# Patient Record
Sex: Female | Born: 1970 | Race: Black or African American | Hispanic: No | Marital: Single | Smoking: Never smoker
Health system: Southern US, Community
[De-identification: ages and names within clinical notes are randomized; demographics above are authoritative.]

## PROBLEM LIST (undated history)

## (undated) DIAGNOSIS — T148XXA Other injury of unspecified body region, initial encounter: Secondary | ICD-10-CM

## (undated) DIAGNOSIS — Z9221 Personal history of antineoplastic chemotherapy: Secondary | ICD-10-CM

## (undated) DIAGNOSIS — R059 Cough, unspecified: Secondary | ICD-10-CM

## (undated) DIAGNOSIS — Z1371 Encounter for nonprocreative screening for genetic disease carrier status: Secondary | ICD-10-CM

## (undated) DIAGNOSIS — Z803 Family history of malignant neoplasm of breast: Secondary | ICD-10-CM

## (undated) DIAGNOSIS — I890 Lymphedema, not elsewhere classified: Secondary | ICD-10-CM

## (undated) DIAGNOSIS — Z3009 Encounter for other general counseling and advice on contraception: Secondary | ICD-10-CM

## (undated) DIAGNOSIS — N649 Disorder of breast, unspecified: Secondary | ICD-10-CM

## (undated) DIAGNOSIS — Z950 Presence of cardiac pacemaker: Secondary | ICD-10-CM

## (undated) DIAGNOSIS — R195 Other fecal abnormalities: Secondary | ICD-10-CM

## (undated) DIAGNOSIS — I10 Essential (primary) hypertension: Secondary | ICD-10-CM

## (undated) DIAGNOSIS — Z853 Personal history of malignant neoplasm of breast: Secondary | ICD-10-CM

## (undated) DIAGNOSIS — Z923 Personal history of irradiation: Secondary | ICD-10-CM

## (undated) DIAGNOSIS — G4730 Sleep apnea, unspecified: Secondary | ICD-10-CM

## (undated) DIAGNOSIS — R0600 Dyspnea, unspecified: Secondary | ICD-10-CM

## (undated) DIAGNOSIS — C50912 Malignant neoplasm of unspecified site of left female breast: Secondary | ICD-10-CM

## (undated) HISTORY — DX: Personal history of malignant neoplasm of breast: Z85.3

## (undated) HISTORY — DX: Lymphedema, not elsewhere classified: I89.0

## (undated) HISTORY — PX: TUMOR REMOVAL: SHX12

## (undated) HISTORY — DX: Encounter for nonprocreative screening for genetic disease carrier status: Z13.71

## (undated) HISTORY — PX: HAND SURGERY: SHX662

## (undated) HISTORY — PX: BREAST LUMPECTOMY: SHX2

## (undated) HISTORY — PX: INCISION AND DRAINAGE ABSCESS ANAL: SUR669

## (undated) HISTORY — PX: CARDIAC ELECTROPHYSIOLOGY MAPPING AND ABLATION: SHX1292

## (undated) HISTORY — DX: Encounter for other general counseling and advice on contraception: Z30.09

## (undated) HISTORY — DX: Family history of malignant neoplasm of breast: Z80.3

## (undated) HISTORY — DX: Other injury of unspecified body region, initial encounter: T14.8XXA

## (undated) HISTORY — DX: Disorder of breast, unspecified: N64.9

## (undated) HISTORY — PX: TREATMENT FISTULA ANAL: SUR1390

## (undated) HISTORY — PX: OTHER SURGICAL HISTORY: SHX169

## (undated) HISTORY — DX: Essential (primary) hypertension: I10

## (undated) HISTORY — DX: Other fecal abnormalities: R19.5

## (undated) HISTORY — DX: Malignant neoplasm of unspecified site of left female breast: C50.912

## (undated) HISTORY — PX: BREAST SURGERY: SHX581

## (undated) MED FILL — Fosaprepitant Dimeglumine For IV Infusion 150 MG (Base Eq): INTRAVENOUS | Qty: 5 | Status: AC

---

## 2002-10-25 HISTORY — PX: OTHER SURGICAL HISTORY: SHX169

## 2010-10-25 HISTORY — PX: BREAST LUMPECTOMY: SHX2

## 2010-10-25 HISTORY — PX: BREAST BIOPSY: SHX20

## 2011-06-16 ENCOUNTER — Ambulatory Visit (INDEPENDENT_AMBULATORY_CARE_PROVIDER_SITE_OTHER): Payer: Self-pay | Admitting: Surgery

## 2011-06-30 ENCOUNTER — Ambulatory Visit
Admission: RE | Admit: 2011-06-30 | Discharge: 2011-06-30 | Disposition: A | Discharge status: Home / Self Care | Payer: 59 | Source: Ambulatory Visit | Attending: Surgery | Admitting: Surgery

## 2011-06-30 ENCOUNTER — Other Ambulatory Visit: Payer: Self-pay | Admitting: Diagnostic Radiology

## 2011-06-30 ENCOUNTER — Other Ambulatory Visit (INDEPENDENT_AMBULATORY_CARE_PROVIDER_SITE_OTHER): Payer: Self-pay | Admitting: Surgery

## 2011-06-30 ENCOUNTER — Ambulatory Visit (INDEPENDENT_AMBULATORY_CARE_PROVIDER_SITE_OTHER): Payer: 59 | Admitting: Surgery

## 2011-06-30 ENCOUNTER — Encounter (INDEPENDENT_AMBULATORY_CARE_PROVIDER_SITE_OTHER): Payer: Self-pay | Admitting: Surgery

## 2011-06-30 VITALS — BP 146/88 | HR 60 | Temp 97.8°F | Ht 65.0 in | Wt 200.2 lb

## 2011-06-30 DIAGNOSIS — N6320 Unspecified lump in the left breast, unspecified quadrant: Secondary | ICD-10-CM

## 2011-06-30 DIAGNOSIS — C50912 Malignant neoplasm of unspecified site of left female breast: Secondary | ICD-10-CM | POA: Insufficient documentation

## 2011-06-30 DIAGNOSIS — C50919 Malignant neoplasm of unspecified site of unspecified female breast: Secondary | ICD-10-CM | POA: Insufficient documentation

## 2011-06-30 DIAGNOSIS — N63 Unspecified lump in unspecified breast: Secondary | ICD-10-CM

## 2011-06-30 HISTORY — DX: Malignant neoplasm of unspecified site of left female breast: C50.912

## 2011-06-30 NOTE — Progress Notes (Signed)
NAME: Jessica Simpson                                                                                      DOB: [birth year] DATE: 06/30/2011               MRN: [id number]   CC:  Chief Complaint  Patient presents with  . Breast Mass    HPI: Jessica Simpson is a 40 y.o.  female who presents with A. recently found mass in the left breast and one in the arm pit area. She's never had any problems with breast masses. She has a grandmother who had postmenopausal breast cancer. These masses aren't symptomatic  She came here to be evaluated for possible biopsy.Marland Kitchen  PMH:  has no past medical history on file.  PSH:  has no past surgical history on file.  ALLERGIES:  Allergies  Allergen Reactions  . Codeine     Nausea, vomiting, pain     MEDICATIONS:  No current outpatient prescriptions on file.    ROS; EXAM: GENERAL: The patient is alert, oriented, and generally healthy-appearing, NAD. Mood and affect are normal.  HEENT: The head is normocephalic, the eyes nonicteric, the pupils were round regular and equal. EOMs are normal. Pharynx normal. Dentition good.  NECK: The neck is supple and there are no masses or thyromegaly.  LUNGS: Normal respirations and clear to auscultation.  HEART: Regular rhythm, with no murmurs rubs or gallops. Pulses are intact carotid dorsalis pedis and posterior tibial. No significant varicosities are noted.  BREASTS: The right breast is completely normal in size and shape. In the left breast there is a large perhaps 4 cm hard mass very superficial in the upper outer quadrant with the center of the mass being about 3 fingerbreadths from the areolar margin. There are some dilated veins overlying this. I think the skin moves over it. It is not deep fixed. The remainder of the breast is normal.  LYMPHATICS: There is no right axillary or bilateral supraclavicular adenopathy. However there is a large, approximately 4 cm, lymph node in the left axilla this very hard.  It does not feel fixed or matted.  ABDOMEN: Soft, flat, and nontender. No masses or organomegaly is noted. No hernias are noted. Bowel sounds are normal.  EXTREMITIES: Good range of motion, no edema.   DATA REVIEWED: We have only the ultrasound report that shows a large mass in the breast and one in the left axilla, c/w the PE  IMPRESSION: Likely Stage II or II left breast cance PLAN:  I spoke with her about what I think this is, a cancer. I've told her that she needs to have a biopsy done. He came here actually expecting to have that done.  I think with lesions in both the breast and the axilla, he would be best to do these under ultrasound guidance and I have spoken with Dr. Deboraha Sprang and the breast center, and even though it is late today, they will see her and probably do both ultrasound guided breast biopsy and an ultrasound-guided biopsy of the left axillary mass.  The patient understands this and we have told her  that since we have the pathology report back we will make further plans.

## 2011-06-30 NOTE — Patient Instructions (Signed)
You need to go to the breast center on the fourth floor of our building when you leave the office. They will be able to do a biopsy of the lump in the left breast and probably one on the lump in the left armpit.  We will call you with the result of the biopsy and make further plans after that.

## 2011-07-01 ENCOUNTER — Other Ambulatory Visit (INDEPENDENT_AMBULATORY_CARE_PROVIDER_SITE_OTHER): Payer: Self-pay | Admitting: Surgery

## 2011-07-01 DIAGNOSIS — C50912 Malignant neoplasm of unspecified site of left female breast: Secondary | ICD-10-CM

## 2011-07-02 ENCOUNTER — Telehealth (INDEPENDENT_AMBULATORY_CARE_PROVIDER_SITE_OTHER): Payer: Self-pay | Admitting: General Surgery

## 2011-07-02 NOTE — Telephone Encounter (Signed)
I left message on machine for patient to call me back.

## 2011-07-02 NOTE — Telephone Encounter (Signed)
Message copied by Liliana Cline on Fri Jul 02, 2011  9:40 AM ------      Message from: Currie Paris      Created: Thu Jul 01, 2011  3:53 PM       I believe she has another appointment. Might see if we can get med onc and rad onc as well. She might want to see someone in [place] or [place]

## 2011-07-06 ENCOUNTER — Encounter (INDEPENDENT_AMBULATORY_CARE_PROVIDER_SITE_OTHER): Payer: Self-pay | Admitting: Surgery

## 2011-07-06 ENCOUNTER — Ambulatory Visit (INDEPENDENT_AMBULATORY_CARE_PROVIDER_SITE_OTHER): Payer: 59 | Admitting: Surgery

## 2011-07-06 ENCOUNTER — Ambulatory Visit
Admission: RE | Admit: 2011-07-06 | Discharge: 2011-07-06 | Disposition: A | Discharge status: Home / Self Care | Payer: 59 | Source: Ambulatory Visit | Attending: Surgery | Admitting: Surgery

## 2011-07-06 VITALS — BP 152/98 | HR 78

## 2011-07-06 DIAGNOSIS — C50919 Malignant neoplasm of unspecified site of unspecified female breast: Secondary | ICD-10-CM

## 2011-07-06 DIAGNOSIS — C50912 Malignant neoplasm of unspecified site of left female breast: Secondary | ICD-10-CM

## 2011-07-06 MED ORDER — GADOBENATE DIMEGLUMINE 529 MG/ML IV SOLN
19.0000 mL | Freq: Once | INTRAVENOUS | Status: AC | PRN
  Administered 2011-07-06: 19 mL via INTRAVENOUS

## 2011-07-06 NOTE — Progress Notes (Signed)
The patient comes back after her biopsy of her left breast mass and her left axillary mass. 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 It was HER-2/neu negative. Hormone receptors are pending. She says she tolerated the biopsy well although had some discomfort for the first 24 hours.  She does have him on a schedule for this afternoon. She does not yet have a oncology appointment.  Exam: The breast appears normal without evidence of hematoma following biopsy.  Data reviewed: I reviewed over the pathology reports. Impression: Clinical stage II and possibly a stage III by size.  Plan: I had a long talk with the patient and her husband. I explained her diagnosis and treatment options including mastectomy, lumpectomy, node dissections, chemotherapy, and radiation therapy. I also talked to her about genetic counseling since she has been diagnosed at a fairly young age and has a grandmother who had breast cancer.  I have told her that we will call her when the MRI is complete. We will try to schedule her for a medical oncology appointment in [place], which is closer to her home. We will also make an appointment for genetic counseling.  Also explained to her about Port-A-Cath in case one of those as needed.  The majority of her visit today was in counseling. This took approximately 30 minutes.

## 2011-07-06 NOTE — Patient Instructions (Signed)
We will call you as soon as we have a report on the MRI. We will contact an oncologist to get an appointment and also make an appointment for genetic counseling to see if you should be tested for an abnormal gene that can lead to breast cancer

## 2011-07-07 ENCOUNTER — Other Ambulatory Visit (INDEPENDENT_AMBULATORY_CARE_PROVIDER_SITE_OTHER): Payer: Self-pay | Admitting: Surgery

## 2011-07-07 DIAGNOSIS — R928 Other abnormal and inconclusive findings on diagnostic imaging of breast: Secondary | ICD-10-CM

## 2011-07-08 ENCOUNTER — Telehealth (INDEPENDENT_AMBULATORY_CARE_PROVIDER_SITE_OTHER): Payer: Self-pay | Admitting: Surgery

## 2011-07-08 NOTE — Telephone Encounter (Signed)
I called and left a message to be sure that she was aware of the MRI results she apparently scheduled for ultrasound biopsy of the area of the contralateral breast tomorrow. I also wanted to make sure that she had an appointment with the oncologist arranged.

## 2011-07-09 ENCOUNTER — Other Ambulatory Visit: Payer: 59

## 2011-07-12 ENCOUNTER — Telehealth (INDEPENDENT_AMBULATORY_CARE_PROVIDER_SITE_OTHER): Payer: Self-pay | Admitting: General Surgery

## 2011-07-12 NOTE — Telephone Encounter (Signed)
Patient called for MR results, after getting Dr Tenna Child message. I made patient aware the MRI showed the area we new about and a small area that looks benign, but they do want to do the biopsy she is set up for just to make sure. Patient is ok with this plan and will proceed with her biopsy on 07/15/11.

## 2011-07-13 ENCOUNTER — Encounter (HOSPITAL_COMMUNITY): Payer: 59 | Admitting: Oncology

## 2011-07-13 NOTE — Progress Notes (Signed)
Thayer Ohm, We tried to see her this pm but she cancelled and wanted to come in 7 days. Minerva Areola

## 2011-07-15 ENCOUNTER — Other Ambulatory Visit (INDEPENDENT_AMBULATORY_CARE_PROVIDER_SITE_OTHER): Payer: Self-pay | Admitting: Surgery

## 2011-07-15 ENCOUNTER — Ambulatory Visit
Admission: RE | Admit: 2011-07-15 | Discharge: 2011-07-15 | Disposition: A | Discharge status: Home / Self Care | Payer: 59 | Source: Ambulatory Visit | Attending: Surgery | Admitting: Surgery

## 2011-07-15 DIAGNOSIS — N6310 Unspecified lump in the right breast, unspecified quadrant: Secondary | ICD-10-CM

## 2011-07-15 DIAGNOSIS — R928 Other abnormal and inconclusive findings on diagnostic imaging of breast: Secondary | ICD-10-CM

## 2011-07-20 ENCOUNTER — Encounter (HOSPITAL_COMMUNITY): Payer: 59 | Attending: Oncology | Admitting: Oncology

## 2011-07-20 ENCOUNTER — Encounter (HOSPITAL_COMMUNITY): Payer: Self-pay | Admitting: Oncology

## 2011-07-20 VITALS — BP 135/78 | HR 73 | Temp 98.5°F | Ht 65.5 in | Wt 198.2 lb

## 2011-07-20 DIAGNOSIS — C50912 Malignant neoplasm of unspecified site of left female breast: Secondary | ICD-10-CM

## 2011-07-20 DIAGNOSIS — C50919 Malignant neoplasm of unspecified site of unspecified female breast: Secondary | ICD-10-CM | POA: Insufficient documentation

## 2011-07-20 DIAGNOSIS — C773 Secondary and unspecified malignant neoplasm of axilla and upper limb lymph nodes: Secondary | ICD-10-CM

## 2011-07-20 LAB — COMPREHENSIVE METABOLIC PANEL
ALT: 11 U/L (ref 0–35)
Albumin: 4.1 g/dL (ref 3.5–5.2)
Alkaline Phosphatase: 103 U/L (ref 39–117)
BUN: 10 mg/dL (ref 6–23)
Chloride: 101 mEq/L (ref 96–112)
Glucose, Bld: 94 mg/dL (ref 70–99)
Potassium: 3.6 mEq/L (ref 3.5–5.1)
Sodium: 136 mEq/L (ref 135–145)
Total Bilirubin: 0.2 mg/dL — ABNORMAL LOW (ref 0.3–1.2)
Total Protein: 7.8 g/dL (ref 6.0–8.3)

## 2011-07-20 LAB — DIFFERENTIAL
Eosinophils Absolute: 0.2 10*3/uL (ref 0.0–0.7)
Lymphs Abs: 3.8 10*3/uL (ref 0.7–4.0)
Monocytes Relative: 8 % (ref 3–12)
Neutro Abs: 4 10*3/uL (ref 1.7–7.7)
Neutrophils Relative %: 46 % (ref 43–77)

## 2011-07-20 LAB — CBC
Hemoglobin: 13.1 g/dL (ref 12.0–15.0)
Platelets: 337 10*3/uL (ref 150–400)
RBC: 5 MIL/uL (ref 3.87–5.11)
WBC: 8.8 10*3/uL (ref 4.0–10.5)

## 2011-07-20 NOTE — Progress Notes (Signed)
Addended by: Sterling Big on: 07/20/2011 06:47 PM   Modules accepted: Orders

## 2011-07-20 NOTE — Patient Instructions (Addendum)
[facility]  Discharge Instructions  RECOMMENDATIONS MADE BY THE CONSULTANT AND ANY TEST RESULTS WILL BE SENT TO YOUR REFERRING DOCTOR.   EXAM FINDINGS BY MD TODAY AND SIGNS AND SYMPTOMS TO REPORT TO CLINIC OR PRIMARY ZO:[id number] cancer has been present for a while and we think you have stage III.  Need to do some scans to make sure there is no other areas of involvement and echocardiogram to make sure your heart function is ok.  Also, recommends genetic counseling and we will make that referral.  You have an aggressive cancer and we need to get started with therapy.  Plan to give you FEC (Epirubicin, cytoxan and 5 fluorouracil) for 4 cycles then taxotere for 4 cycles.  With the [facility] would need to support you with Neulasta 24 hours after chemotherapy. Want to treat you every 14 days.  At any time if you want a second opinion we will be happy to assist with that.  MEDICATIONS PRESCRIBED: none   INSTRUCTIONS GIVEN AND DISCUSSED: Other : We will get your scans scheduled and call you with the dates and times.  Once you get a port a cath inserted we can get your chemotherapy started.  SPECIAL INSTRUCTIONS/FOLLOW-UP: To be arranged.   I acknowledge that I have been informed and understand all the instructions given to me and received a copy. I do not have any more questions at this time, but understand that I may call the Specialty Clinic at [facility] at [phone] during business hours should I have any further questions or need assistance in obtaining follow-up care.    __________________________________________  _____________  __________ Signature of Patient or Authorized Representative            Date                   Time    __________________________________________ Nurse's Signature

## 2011-07-20 NOTE — Progress Notes (Signed)
This office note has been dictated.

## 2011-07-21 ENCOUNTER — Telehealth (HOSPITAL_COMMUNITY): Payer: Self-pay

## 2011-07-21 ENCOUNTER — Other Ambulatory Visit (HOSPITAL_COMMUNITY): Payer: Self-pay | Admitting: Oncology

## 2011-07-21 LAB — CANCER ANTIGEN 27.29: CA 27.29: 108 U/mL — ABNORMAL HIGH (ref 0–39)

## 2011-07-21 NOTE — Telephone Encounter (Signed)
Patient notified:  Bone Scan scheduled for tomorrow @ 11 am, 2 D Echocardiogram @ 1 pm here at [facility] and PET Scan scheduled for 10/3 @ 8:30am @ [facility]  Instructions given regarding prep for PET Scan.

## 2011-07-21 NOTE — Telephone Encounter (Signed)
To come to clinic for chemo teaching after injection for Bone Scan tomorrow with Rosana Berger, RN.

## 2011-07-21 NOTE — Progress Notes (Signed)
CC:   Currie Paris, M.D. Grayland Jack, M.D.  DIAGNOSES: 1. Advanced, stage III, left-sided breast cancer, triple negative that     is at least 7 cm in size in the left breast, both clinically and     radiographically and she has multiple positive nodes in the left     axilla, the largest of which is 5-cm radiographically, 6-7 cm     clinically.  She does not have any overtly obvious supraclavicular,     infraclavicular, cervical or inguinal nodes. 2. Mild obesity. 3. History of 1 pregnancy, labor and delivery 10 years ago.  Her     daughters is 58 years old at this time, alive and in good health.  HISTORY:  A very pleasant, 40 year old, African American woman from the [place] area who thought she noticed a lump in the breast last spring of 2011, brought it to the attention of her gynecologist she states in the summer of 2011 as well as an internist in the same month.  She was told that it was probably nothing to worry about.  She was recommended as having a mammogram at age 7.  There was a family history in her maternal grandmother of breast cancer at age 62.  Her grandmother is still alive 11 years out she states.  There is no other family history she states.  She did not breast-feed her child when she was born 10 years ago.  She does not use birth control pills.  This spring, she noticed that it was getting larger but went for the mammogram and was told that it was negative.  When she re-presented to her gynecologist this summer she was told that she needed a digital mammogram she states, went on to have that and biopsy.  I do not have all the original biopsy results in front of me but it is reported through [facility] that this is a triple negative breast cancer.  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  She is here today by herself  to discuss her findings.  She has not been fully staged at this time.  ALLERGIES:  She has no allergies that I am aware of but she reacts to codeine by having nausea and vomiting.  MEDICATIONS:  She is on no medications.  REVIEW OF SYSTEMS:  The rest of the review of systems is negative.  She has never been married.  She was born in the [place], [place] area. She moved down to the [place] area long ago for a potential wedding that never took place 13 years ago.  SOCIAL HISTORY:  She works for a company in [place], [employer].  PHYSICAL EXAMINATION:  Vital signs:  Today show that she is 5 feet 5-1/2 inches tall, weighs 198 pounds, body surface area is 2.04 m2, BMI is 32. Blood pressure 135/78, right arm sitting position, pulse 72 and regular, respirations 16 to 18 and unlabored.  She is afebrile.  General:  She is alert.  She is oriented.  She has normal facial symmetry.  Pupils equally round and reactive to light.  EOMs appear intact.  Throat is clear.  Tongue is normal in the midline.  She follows all commands.  She has good strength in both arms.  She will has a normal gait.  She has no adenopathy in the cervical, supraclavicular, infraclavicular  areas bilaterally, but she has large massive nodes in the left axilla and it measures at least about 6-7 cm across.  In the left breast upper outer quadrant is this large mass which is at least 7-9 cm clinically.  There is no obvious distortion of skin.  The right breast is negative for masses.  Heart:  Shows a regular rhythm and rate without murmur, rub or gallop.  Lungs:  Clear to auscultation and percussion.  Abdomen:  Soft, nontender, without organomegaly or masses.  She has no skin abnormalities.  She has no peripheral edema of the arms or legs.  She is right-handed.  I think Yarixa is just overwhelmed by this diagnosis of advanced local regional breast cancer.  She knows that we are worried about distant disease.  Dr.  Basilio Cairo mentioned that she needs staging and Dr. Basilio Cairo and I have discussed her case today.  We will stage with a PET scan and bone scan as soon as we can set them up.  She will need a Port-A-Cath and what I have proposed to her with this triple negative breast cancer is 1 FEC dose dense every 2 weeks for 4 cycles followed by Taxotere for 2 weeks for 4 cycles.  She needs to do testing to see if she is BRCA1 or BRCA2 positive.  She needs a 2-D echo to evaluate heart function as a baseline and then she needs a Port-A-Cath of course placed prior to therapy.  She knows that if she has stage IV disease the vast evidence indicates we will not be able to cure this.  She will try to get through this she states as best she can.  She knows that this is obviously a serious, serious life-threatening issue.  We will try to set things up as soon as we can.  If this is stage III disease she wanted to know what her chances are of being cured of this disease and I told her that we probably have a 45- 50% chance of increasing her chances of being disease free.  I do not think I can quote her a better survival necessarily.  I have offered her a second opinion anywhere she would like to go and she understands that we are willing to do this.  She will think about this but states that she just would like to get started as soon as possible.  We will see her back in followup.  She does state that she is going on a 3-day trip next Thursday, Friday and Saturday and would like to do that if possible before starting therapy.  I think we can work that out I told her.  I would however, like to get her workup, 2-D echo, all scheduled at least before going away.  Perhaps Dr. Jamey Ripa can put her on the schedule for the 8th for a Port-A-Cath and we can treat her on the 9th.  I would like to see her the day she is treated first.  I think we answered all the questions she had.  We spent an hour and a half together  and spent most of that time in counseling and discussing therapeutic options, staging, etc.  She wanted to know what would take place if she did not take any therapy and I described to her the fact that if she had surgery only that she would probably have a 100% chance of this disease coming back and taking her life.  I suspect that even with chemotherapy for  this triple- negative disease that my sense of improvement with chemotherapy maybe a little on the optimistic side but I total her we will certainly try to do the best we can with therapy and she states that she is willing to proceed.    ______________________________ Ladona Horns. Mariel Sleet, MD ESN/MEDQ  D:  07/20/2011  T:  07/21/2011  Job:  [id number]

## 2011-07-22 ENCOUNTER — Encounter (HOSPITAL_COMMUNITY)
Admission: RE | Admit: 2011-07-22 | Discharge: 2011-07-22 | Disposition: A | Discharge status: Home / Self Care | Payer: 59 | Source: Ambulatory Visit | Attending: Oncology | Admitting: Oncology

## 2011-07-22 ENCOUNTER — Ambulatory Visit (HOSPITAL_COMMUNITY)
Admission: RE | Admit: 2011-07-22 | Discharge: 2011-07-22 | Disposition: A | Discharge status: Home / Self Care | Payer: 59 | Source: Ambulatory Visit | Attending: Oncology | Admitting: Oncology

## 2011-07-22 ENCOUNTER — Encounter (HOSPITAL_COMMUNITY): Payer: Self-pay

## 2011-07-22 ENCOUNTER — Telehealth (HOSPITAL_COMMUNITY): Payer: Self-pay | Admitting: *Deleted

## 2011-07-22 ENCOUNTER — Encounter (HOSPITAL_COMMUNITY): Payer: 59

## 2011-07-22 ENCOUNTER — Telehealth (INDEPENDENT_AMBULATORY_CARE_PROVIDER_SITE_OTHER): Payer: Self-pay | Admitting: Surgery

## 2011-07-22 DIAGNOSIS — Z9221 Personal history of antineoplastic chemotherapy: Secondary | ICD-10-CM | POA: Insufficient documentation

## 2011-07-22 DIAGNOSIS — C50912 Malignant neoplasm of unspecified site of left female breast: Secondary | ICD-10-CM

## 2011-07-22 DIAGNOSIS — Z853 Personal history of malignant neoplasm of breast: Secondary | ICD-10-CM | POA: Insufficient documentation

## 2011-07-22 DIAGNOSIS — M25569 Pain in unspecified knee: Secondary | ICD-10-CM | POA: Insufficient documentation

## 2011-07-22 DIAGNOSIS — C50919 Malignant neoplasm of unspecified site of unspecified female breast: Secondary | ICD-10-CM | POA: Insufficient documentation

## 2011-07-22 DIAGNOSIS — I517 Cardiomegaly: Secondary | ICD-10-CM

## 2011-07-22 MED ORDER — TECHNETIUM TC 99M MEDRONATE IV KIT: 25.0000 | PACK | Freq: Once | INTRAVENOUS | Status: DC | PRN

## 2011-07-22 NOTE — Telephone Encounter (Signed)
I spoke with her little bit about her diagnosis. She has become concerned because initially she thought she was going to be a stage II and now she may actually be a stage IV. I went over all of that in detail with her. She had a bone scan today which is negative for metastatic disease. She has a PET scan scheduled next week to look for metastatic disease.  She is now planning to have neoadjuvant chemotherapy and I will be putting a Port-A-Cath in her in about 10 days. The chemotherapy we'll start the next day.  I told her to give me a call if she has any other questions or concerns.

## 2011-07-22 NOTE — Progress Notes (Signed)
*  PRELIMINARY RESULTS* Echocardiogram 2D Echocardiogram has been performed.  Conrad Tazewell 07/22/2011, 1:27 PM

## 2011-07-22 NOTE — Telephone Encounter (Signed)
Dr. Mariel Sleet - pt would like a second opinion wherever you recommend. I did chemo teaching and she is not thrilled about taking chemo (to say the least). She is very angry with her situation. She is not angry with [place]. Just the situation.

## 2011-07-22 NOTE — Patient Instructions (Signed)
[facility]   CHEMOTHERAPY INSTRUCTIONS 5FU, Epirubicin, Cytoxan, Taxotere  POTENTIAL SIDE EFFECTS OF TREATMENT: Increased Susceptibility to Infection, Vomiting, Constipation, Red or Pink Urine (with Adriamycin/Epirubicin), Hair Thinning, Hair Loss, Changes in Character of Skin and Nails (brittleness, dryness,etc.), Pigment Changes (darkening of nail beds, palms of hands, soles of feet, etc.), Bone Marrow Suppression, Abdominal Cramping, Urinary Frequency and Blood in Urine   SELF IMAGE NEEDS AND REFERRALS MADE: Obtain hair accessories as soon as possible (wigs, scarves, turbans,caps,etc.) and Referral to Look Good, Feel Better consultant   EDUCATIONAL MATERIALS GIVEN AND REVIEWED: Chemotherapy and You and Radiation Therapy and You   SELF CARE ACTIVITIES WHILE ON CHEMOTHERAPY: Increase your fluid intake 48 hours prior to treatment and drink at least 2 quarts per day after treatment., No alcohol intake., No aspirin or other medications unless approved by your oncologist., Eat foods that are light and easy to digest., No fried, fatty, or spicy foods immediately before or after treatment., Have teeth cleaned professionally before starting treatment. Keep dentures and partial plates clean., Use soft toothbrush and do not use mouthwashes that contain alcohol. Biotene is a good mouthwash that is available at most pharmacies or may be ordered by calling [phone]., Use warm salt water gargles (1 teaspoon salt per 1 quart warm water) before and after meals and at bedtime. Or you may rinse with 2 tablespoons of three -percent hydrogen peroxide mixed in eight ounces of water. and Always use sunscreen with SPF (Sun Protection Factor) of 30 or higher.   MEDICATIONS: You have been given prescriptions for the following medications: Ativan 1mg  every 3 to 4 hours as needed for nausea or vomiting  Compazine suppositories 25mg  1 per rectum every 6 hours as needed for  nausea or vomiting Zofran (take as directed) for nausea/vomiting Dexamethasone (take as directed) for nausea/vomiting   SYMPTOMS TO REPORT AS SOON AS POSSIBLE AFTER TREATMENT:  FEVER GREATER THAN 100.5 F  CHILLS WITH OR WITHOUT FEVER  NAUSEA AND VOMITING THAT IS NOT CONTROLLED WITH YOUR NAUSEA MEDICATION  UNUSUAL SHORTNESS OF BREATH  UNUSUAL BRUISING OR BLEEDING  TENDERNESS IN MOUTH AND THROAT WITH OR WITHOUT PRESENCE OF ULCERS  URINARY PROBLEMS  BOWEL PROBLEMS  UNUSUAL RASH    Wear comfortable clothing and clothing appropriate for easy access to any Portacath or PICC line. Let us know if there is anything that we can do to make your therapy better!      I have been informed and understand all of the instructions given to me and have received a copy. I have been instructed to call the clinic [phone] or my family physician as soon as possible for continued medical care, if indicated. I do not have any more questions at this time but understand that I may call the Cancer Center or the Patient Navigator at [phone] during office hours should I have questions or need assistance in obtaining follow-up care.      _________________________________________      _______________     __________ Signature of Patient or Authorized Representative        Date                            Time      _________________________________________ Nurse's Signature

## 2011-07-23 ENCOUNTER — Telehealth (HOSPITAL_COMMUNITY): Payer: Self-pay

## 2011-07-23 ENCOUNTER — Other Ambulatory Visit (HOSPITAL_COMMUNITY): Payer: Self-pay | Admitting: Oncology

## 2011-07-23 NOTE — Telephone Encounter (Signed)
Notified that bone scan was negative per request of Dr. Mariel Sleet.

## 2011-07-26 MED ORDER — LORAZEPAM 1 MG PO TABS: 1.0000 mg | ORAL_TABLET | ORAL | Status: DC | PRN

## 2011-07-26 MED ORDER — PROCHLORPERAZINE 25 MG RE SUPP: 25.0000 mg | Freq: Four times a day (QID) | RECTAL | Status: DC | PRN

## 2011-07-26 MED ORDER — ONDANSETRON HCL 8 MG PO TABS: ORAL_TABLET | ORAL | Status: DC

## 2011-07-26 MED ORDER — DEXAMETHASONE 4 MG PO TABS: ORAL_TABLET | ORAL | Status: DC

## 2011-07-26 NOTE — Progress Notes (Signed)
Chemo teaching done on 07/22/11. Chemo consent and chemo teaching not signed due to patient wanting to see Dr. Mariel Sleet again before signing consent. Patient was very overwhelmed with the chemo information.

## 2011-07-28 ENCOUNTER — Encounter (HOSPITAL_COMMUNITY)
Admission: RE | Admit: 2011-07-28 | Discharge: 2011-07-28 | Disposition: A | Discharge status: Home / Self Care | Payer: 59 | Source: Ambulatory Visit | Attending: Surgery | Admitting: Surgery

## 2011-07-28 ENCOUNTER — Encounter (HOSPITAL_COMMUNITY)
Admission: RE | Admit: 2011-07-28 | Discharge: 2011-07-28 | Disposition: A | Discharge status: Home / Self Care | Payer: 59 | Source: Ambulatory Visit | Attending: Oncology | Admitting: Oncology

## 2011-07-28 ENCOUNTER — Ambulatory Visit (HOSPITAL_COMMUNITY): Payer: 59 | Admitting: Oncology

## 2011-07-28 DIAGNOSIS — C50919 Malignant neoplasm of unspecified site of unspecified female breast: Secondary | ICD-10-CM | POA: Insufficient documentation

## 2011-07-28 DIAGNOSIS — R599 Enlarged lymph nodes, unspecified: Secondary | ICD-10-CM | POA: Insufficient documentation

## 2011-07-28 DIAGNOSIS — C50912 Malignant neoplasm of unspecified site of left female breast: Secondary | ICD-10-CM

## 2011-07-28 LAB — SURGICAL PCR SCREEN
MRSA, PCR: NEGATIVE
Staphylococcus aureus: NEGATIVE

## 2011-07-28 MED ORDER — FLUDEOXYGLUCOSE F - 18 (FDG) INJECTION
16.2000 | Freq: Once | INTRAVENOUS | Status: AC | PRN
  Administered 2011-07-28: 16.2 via INTRAVENOUS

## 2011-07-30 ENCOUNTER — Telehealth (HOSPITAL_COMMUNITY): Payer: Self-pay

## 2011-07-30 NOTE — Telephone Encounter (Signed)
error 

## 2011-08-02 ENCOUNTER — Ambulatory Visit (HOSPITAL_COMMUNITY): Payer: 59

## 2011-08-02 ENCOUNTER — Encounter (INDEPENDENT_AMBULATORY_CARE_PROVIDER_SITE_OTHER): Payer: Self-pay | Admitting: Surgery

## 2011-08-02 ENCOUNTER — Ambulatory Visit (HOSPITAL_COMMUNITY)
Admission: RE | Admit: 2011-08-02 | Discharge: 2011-08-02 | Disposition: A | Discharge status: Home / Self Care | Payer: 59 | Source: Ambulatory Visit | Attending: Surgery | Admitting: Surgery

## 2011-08-02 ENCOUNTER — Telehealth (HOSPITAL_COMMUNITY): Payer: Self-pay | Admitting: Oncology

## 2011-08-02 DIAGNOSIS — C50919 Malignant neoplasm of unspecified site of unspecified female breast: Secondary | ICD-10-CM

## 2011-08-02 DIAGNOSIS — I10 Essential (primary) hypertension: Secondary | ICD-10-CM | POA: Insufficient documentation

## 2011-08-02 DIAGNOSIS — Z01812 Encounter for preprocedural laboratory examination: Secondary | ICD-10-CM | POA: Insufficient documentation

## 2011-08-02 DIAGNOSIS — E669 Obesity, unspecified: Secondary | ICD-10-CM | POA: Insufficient documentation

## 2011-08-02 HISTORY — PX: PORTACATH PLACEMENT: SHX2246

## 2011-08-03 ENCOUNTER — Encounter (HOSPITAL_BASED_OUTPATIENT_CLINIC_OR_DEPARTMENT_OTHER): Payer: 59

## 2011-08-03 ENCOUNTER — Encounter (HOSPITAL_COMMUNITY): Payer: Self-pay | Admitting: Oncology

## 2011-08-03 ENCOUNTER — Encounter (HOSPITAL_COMMUNITY): Payer: 59 | Attending: Oncology | Admitting: Oncology

## 2011-08-03 ENCOUNTER — Telehealth (HOSPITAL_COMMUNITY): Payer: Self-pay | Admitting: *Deleted

## 2011-08-03 VITALS — Ht 65.0 in | Wt 195.0 lb

## 2011-08-03 VITALS — BP 168/87 | HR 72 | Temp 98.3°F | Ht 65.5 in | Wt 195.0 lb

## 2011-08-03 DIAGNOSIS — E669 Obesity, unspecified: Secondary | ICD-10-CM

## 2011-08-03 DIAGNOSIS — C773 Secondary and unspecified malignant neoplasm of axilla and upper limb lymph nodes: Secondary | ICD-10-CM

## 2011-08-03 DIAGNOSIS — C50919 Malignant neoplasm of unspecified site of unspecified female breast: Secondary | ICD-10-CM

## 2011-08-03 DIAGNOSIS — Z5111 Encounter for antineoplastic chemotherapy: Secondary | ICD-10-CM

## 2011-08-03 DIAGNOSIS — C50912 Malignant neoplasm of unspecified site of left female breast: Secondary | ICD-10-CM

## 2011-08-03 DIAGNOSIS — D702 Other drug-induced agranulocytosis: Secondary | ICD-10-CM | POA: Insufficient documentation

## 2011-08-03 MED ORDER — HEPARIN SOD (PORK) LOCK FLUSH 100 UNIT/ML IV SOLN
INTRAVENOUS | Status: AC
  Administered 2011-08-03: 500 [IU]
  Filled 2011-08-03: qty 5

## 2011-08-03 MED ORDER — DEXAMETHASONE SODIUM PHOSPHATE 4 MG/ML IJ SOLN: 12.0000 mg | Freq: Once | INTRAMUSCULAR | Status: DC

## 2011-08-03 MED ORDER — SODIUM CHLORIDE 0.9 % IV SOLN
Freq: Once | INTRAVENOUS | Status: AC
  Administered 2011-08-03: 10:00:00 via INTRAVENOUS

## 2011-08-03 MED ORDER — SODIUM CHLORIDE 0.9 % IV SOLN
500.0000 mg/m2 | Freq: Once | INTRAVENOUS | Status: AC
  Administered 2011-08-03: 1020 mg via INTRAVENOUS
  Filled 2011-08-03 (×2): qty 51

## 2011-08-03 MED ORDER — EPIRUBICIN HCL CHEMO IV INJECTION 200 MG/100ML
100.0000 mg/m2 | Freq: Once | INTRAVENOUS | Status: AC
  Administered 2011-08-03: 204 mg via INTRAVENOUS
  Filled 2011-08-03 (×4): qty 102

## 2011-08-03 MED ORDER — SODIUM CHLORIDE 0.9 % IV SOLN
150.0000 mg | Freq: Once | INTRAVENOUS | Status: AC
  Administered 2011-08-03: 150 mg via INTRAVENOUS
  Filled 2011-08-03: qty 5

## 2011-08-03 MED ORDER — FLUOROURACIL CHEMO INJECTION 2.5 GM/50ML
500.0000 mg/m2 | Freq: Once | INTRAVENOUS | Status: AC
  Administered 2011-08-03: 1000 mg via INTRAVENOUS
  Filled 2011-08-03 (×2): qty 20

## 2011-08-03 MED ORDER — HEPARIN SOD (PORK) LOCK FLUSH 100 UNIT/ML IV SOLN
500.0000 [IU] | Freq: Once | INTRAVENOUS | Status: AC | PRN
  Administered 2011-08-03: 500 [IU]
  Filled 2011-08-03: qty 5

## 2011-08-03 MED ORDER — PALONOSETRON HCL INJECTION 0.25 MG/5ML
0.2500 mg | Freq: Once | INTRAVENOUS | Status: AC
  Administered 2011-08-03: 0.25 mg via INTRAVENOUS
  Filled 2011-08-03: qty 5

## 2011-08-03 NOTE — Progress Notes (Signed)
This office note has been dictated.

## 2011-08-03 NOTE — Progress Notes (Signed)
Tolerated chemo well. Home accompanied by spouse. 

## 2011-08-03 NOTE — Patient Instructions (Addendum)
[facility]  Discharge Instructions  RECOMMENDATIONS MADE BY THE CONSULTANT AND ANY TEST RESULTS WILL BE SENT TO YOUR REFERRING DOCTOR.   EXAM FINDINGS BY MD TODAY AND SIGNS AND SYMPTOMS TO REPORT TO CLINIC OR PRIMARY MD: Your bone scan was negative and your PET scan only showed areas of involvement that we already know about.  No evidence of disease anywhere else in your body.   Triple negative breast cancer is more common in young African American women.  We are using dose dense therapy in an attempt to reduce your tumor burden before you have surgery.  You will get Neulasta injection 24 hours after therapy to keep your white blood cell count up and hopefully reduce your risk of getting an infection.  Most important part of prevention of infection is good hand washing for you and everyone that is going to be around you.  MEDICATIONS PRESCRIBED: none    SPECIAL INSTRUCTIONS/FOLLOW-UP: Return to Clinic in 2 weeks on 10/23 at 10:30 to see our PA [facility] and in 4 weeks on 11/12 at 11:30  to see Dr. Mariel Sleet.   I acknowledge that I have been informed and understand all the instructions given to me and received a copy. I do not have any more questions at this time, but understand that I may call the Specialty Clinic at [facility] at [phone] during business hours should I have any further questions or need assistance in obtaining follow-up care.    __________________________________________  _____________  __________ Signature of Patient or Authorized Representative            Date                   Time    __________________________________________ Nurse's Signature

## 2011-08-03 NOTE — Telephone Encounter (Signed)
Dr. Mariel Sleet - patient wants to know if she can go ahead and get a rx or have me call in a rx for an appetite stimulant b/c she already isn't eating very well and is wanting to be prepared for chemo taking away her appetite. Please advise.

## 2011-08-04 ENCOUNTER — Encounter (HOSPITAL_BASED_OUTPATIENT_CLINIC_OR_DEPARTMENT_OTHER): Payer: 59

## 2011-08-04 ENCOUNTER — Telehealth (HOSPITAL_COMMUNITY): Payer: Self-pay | Admitting: Oncology

## 2011-08-04 DIAGNOSIS — D701 Agranulocytosis secondary to cancer chemotherapy: Secondary | ICD-10-CM

## 2011-08-04 DIAGNOSIS — C773 Secondary and unspecified malignant neoplasm of axilla and upper limb lymph nodes: Secondary | ICD-10-CM

## 2011-08-04 DIAGNOSIS — C50912 Malignant neoplasm of unspecified site of left female breast: Secondary | ICD-10-CM

## 2011-08-04 DIAGNOSIS — C50919 Malignant neoplasm of unspecified site of unspecified female breast: Secondary | ICD-10-CM

## 2011-08-04 MED ORDER — PEGFILGRASTIM INJECTION 6 MG/0.6ML
SUBCUTANEOUS | Status: AC
  Administered 2011-08-04: 6 mg via SUBCUTANEOUS
  Filled 2011-08-04: qty 0.6

## 2011-08-04 MED ORDER — PEGFILGRASTIM INJECTION 6 MG/0.6ML
6.0000 mg | Freq: Once | SUBCUTANEOUS | Status: AC
  Administered 2011-08-04: 6 mg via SUBCUTANEOUS

## 2011-08-04 NOTE — Progress Notes (Signed)
Tolerated injection well. 

## 2011-08-04 NOTE — Op Note (Signed)
  Jessica Simpson, MEHARG NO.:  [id number]  MEDICAL RECORD NO.:  [id number]  LOCATION:  [facility]SC                         FACILITY:  [facility]  PHYSICIAN:  Currie Paris, M.D.DATE OF BIRTH:  1971/09/28  DATE OF PROCEDURE:  08/02/2011 DATE OF DISCHARGE:                              OPERATIVE REPORT   PREOPERATIVE DIAGNOSIS:  Stage III left breast cancer.  POSTOPERATIVE DIAGNOSIS:  Stage III left breast cancer.  PROCEDURE:  Port-A-Cath placement.  SURGEON:  Currie Paris, MD  ASSISTANT:  Larrie Kass, PA student.  ANESTHESIA:  General (LMA).  CLINICAL HISTORY:  This is a 40 year old lady who has stage III breast cancer getting ready to start neoadjuvant chemotherapy and needed IV access for the chemo.  DESCRIPTION OF PROCEDURE:  I saw the patient in the preoperative area and reviewed the plans for the procedure and she had no further questions.  She was taken to the operating room.  After satisfactory general anesthesia had been obtained, the upper chest and lower neck were prepped and draped as a sterile field and the time-out was done.  I entered the right subclavian vein on the initial attempt and using fluoro threaded the guidewire into the superior vena cava right atrial area.  A transverse incision was made on the anterior chest wall in a pocket fashion for the Port-A-Cath reservoir.  The Port-A-Cath tubing was then pulled through the subcutaneous tunnel from the reservoir site to the guidewire site.  Using fluoro, I advanced the peel-away sheath and dilator over the guidewire following this with fluoro.  I went ahead in the superior vena cava.  I removed the guidewire and dilator.  The catheter was threaded into about 22 cm and the peel-away sheath removed.  This aspirated and flushed easily.  I backed it up to approximately 17 cm where it appeared to be in the distal SVC.  It aspirated and flushed easily.  The reservoir was flushed,  attached, and a locking mechanism engaged. This was placed in the pocket.  A fluoro was done and this appeared to be in good position.  I then closed the incision with 3-0 Vicryl and 4-0 Monocryl subcuticular, had some Dermabond.  The port was accessed and aspirated, flushed with dilute and then concentrated aqueous heparin and left accessed for her chemo tomorrow.  The patient tolerated the procedure well.  There were no complications. All counts were correct.     Currie Paris, M.D.     CJS/MEDQ  D:  08/02/2011  T:  08/02/2011  Job:  [id number]  cc:   Ladona Horns. Mariel Sleet, MD  Electronically Signed by Cyndia Bent M.D. on 08/04/2011 07:15:12 PM

## 2011-08-04 NOTE — Progress Notes (Signed)
CC:   Currie Paris, M.D. Grayland Jack, M.D.  DIAGNOSES: 1. She had a triple negative breast cancer that is Ki-67 marker 99%,     and she has at least a T3 N2 cancer giving her stage IIIA disease     at this time.  Her PET scan showed no evidence of distant     metastases.  Her cancer marker however, CA 27.29, was high at 108.     Her liver enzymes however are normal.  CBC and differential are     unremarkable. 2. Obesity. 3. Codeine intolerance.  HISTORY:  This is a very pleasant 40 year old [place] American woman with triple negative breast cancer who has now been staged.  She had a bone scan on the 27th of September which showed no evidence for metastatic disease.  We also did a PET scan on 07/28/2011 which showed no evidence of distant disease, but she had an intensely metabolic tumor in the left breast that measured 6.6 x 3.5 cm with an SUV max of 38. She had enlarged hypermetabolic left axillary nodes, one of which was small and just between the pectoralis major and minor muscles measuring 6 mm but with an SUV of 4.2, and then this large mass that is at least 3.2 cm in size with an SUV of 30 which appears to be probably a conglomerate of nodes in my opinion.  She is here today with her significant other to go over the results but also to initiate chemotherapy.  We are planning dose dense FEC followed by dose dense Taxotere for 4 cycles each.  She is also going to have a consultation for a second opinion with Dr. Marc Morgans at [facility] a week from this coming Friday.  I placed a phone call to Alan Ripper but have not heard back from her yet.  I wanted to just touch base with Alan Ripper about her situation, our plans for therapy and see if she had anything to add, but more importantly to discuss her at a time when we can both be on the same page with her therapeutic plan.  She of course saw Dr. Lonie Peak who definitely feels that she is a candidate for radiation  therapy after definitive chemotherapy and surgery.  We also plan genetic testing which has been set up for her as well.  I think we are very fortunate not to have distant metastatic disease seen on a bone scan or PET scan.  I think we have a chance to significantly impact this cancer even though she has a poor prognosis in some ways but we are going to be very aggressive with her therapy and do the best we can.  I think we answered all of their questions as best we could, and she is ready to proceed.    ______________________________ Ladona Horns. Mariel Sleet, MD ESN/MEDQ  D:  08/03/2011  T:  08/04/2011  Job:  [id number]

## 2011-08-17 ENCOUNTER — Encounter (HOSPITAL_BASED_OUTPATIENT_CLINIC_OR_DEPARTMENT_OTHER): Payer: 59

## 2011-08-17 ENCOUNTER — Ambulatory Visit (HOSPITAL_COMMUNITY): Payer: 59 | Admitting: Oncology

## 2011-08-17 VITALS — BP 129/83 | HR 91 | Temp 97.8°F | Ht 65.0 in | Wt 197.0 lb

## 2011-08-17 DIAGNOSIS — Z5111 Encounter for antineoplastic chemotherapy: Secondary | ICD-10-CM

## 2011-08-17 DIAGNOSIS — C773 Secondary and unspecified malignant neoplasm of axilla and upper limb lymph nodes: Secondary | ICD-10-CM

## 2011-08-17 DIAGNOSIS — C50912 Malignant neoplasm of unspecified site of left female breast: Secondary | ICD-10-CM

## 2011-08-17 DIAGNOSIS — C50919 Malignant neoplasm of unspecified site of unspecified female breast: Secondary | ICD-10-CM

## 2011-08-17 LAB — COMPREHENSIVE METABOLIC PANEL
BUN: 11 mg/dL (ref 6–23)
CO2: 27 mEq/L (ref 19–32)
Calcium: 9 mg/dL (ref 8.4–10.5)
GFR calc Af Amer: >90 mL/min (ref >90)
GFR calc non Af Amer: 87 mL/min — ABNORMAL LOW (ref >90)
Glucose, Bld: 108 mg/dL — ABNORMAL HIGH (ref 70–99)
Total Protein: 6.6 g/dL (ref 6.0–8.3)

## 2011-08-17 LAB — DIFFERENTIAL
Eosinophils Absolute: 0 10*3/uL (ref 0.0–0.7)
Eosinophils Relative: 0 % (ref 0–5)
Lymphocytes Relative: 25 % (ref 12–46)
Neutro Abs: 3.4 10*3/uL (ref 1.7–7.7)
Neutrophils Relative %: 62 % (ref 43–77)

## 2011-08-17 LAB — CBC
MCHC: 32.9 g/dL (ref 30.0–36.0)
Platelets: 189 10*3/uL (ref 150–400)
RDW: 14.1 % (ref 11.5–15.5)
WBC: 5.5 10*3/uL (ref 4.0–10.5)

## 2011-08-17 MED ORDER — SODIUM CHLORIDE 0.9 % IV SOLN
Freq: Once | INTRAVENOUS | Status: AC
  Administered 2011-08-17: 500 mL via INTRAVENOUS

## 2011-08-17 MED ORDER — DEXAMETHASONE SODIUM PHOSPHATE 4 MG/ML IJ SOLN: 12.0000 mg | Freq: Once | INTRAMUSCULAR | Status: DC

## 2011-08-17 MED ORDER — SODIUM CHLORIDE 0.9 % IV SOLN: 150.0000 mg | Freq: Once | INTRAVENOUS | Status: DC

## 2011-08-17 MED ORDER — SODIUM CHLORIDE 0.9 % IV SOLN
500.0000 mg/m2 | Freq: Once | INTRAVENOUS | Status: AC
  Administered 2011-08-17: 1020 mg via INTRAVENOUS
  Filled 2011-08-17: qty 51

## 2011-08-17 MED ORDER — PALONOSETRON HCL INJECTION 0.25 MG/5ML
0.2500 mg | Freq: Once | INTRAVENOUS | Status: AC
  Administered 2011-08-17: 0.25 mg via INTRAVENOUS
  Filled 2011-08-17: qty 5

## 2011-08-17 MED ORDER — EPIRUBICIN HCL CHEMO IV INJECTION 200 MG/100ML
100.0000 mg/m2 | Freq: Once | INTRAVENOUS | Status: AC
  Administered 2011-08-17: 204 mg via INTRAVENOUS
  Filled 2011-08-17: qty 102

## 2011-08-17 MED ORDER — HEPARIN SOD (PORK) LOCK FLUSH 100 UNIT/ML IV SOLN
500.0000 [IU] | Freq: Once | INTRAVENOUS | Status: AC | PRN
  Administered 2011-08-17: 500 [IU]
  Filled 2011-08-17: qty 5

## 2011-08-17 MED ORDER — FLUOROURACIL CHEMO INJECTION 2.5 GM/50ML
500.0000 mg/m2 | Freq: Once | INTRAVENOUS | Status: AC
  Administered 2011-08-17: 1000 mg via INTRAVENOUS
  Filled 2011-08-17: qty 20

## 2011-08-17 MED ORDER — SODIUM CHLORIDE 0.9 % IJ SOLN
10.0000 mL | INTRAMUSCULAR | Status: DC | PRN
  Filled 2011-08-17: qty 10

## 2011-08-17 MED ORDER — HEPARIN SOD (PORK) LOCK FLUSH 100 UNIT/ML IV SOLN
INTRAVENOUS | Status: AC
  Administered 2011-08-17: 500 [IU]
  Filled 2011-08-17: qty 5

## 2011-08-17 MED ORDER — SODIUM CHLORIDE 0.9 % IV SOLN
Freq: Once | INTRAVENOUS | Status: AC
  Administered 2011-08-17: 12:00:00 via INTRAVENOUS
  Filled 2011-08-17: qty 5

## 2011-08-17 NOTE — Progress Notes (Signed)
Tolerated chemo well. 

## 2011-08-18 ENCOUNTER — Encounter (HOSPITAL_BASED_OUTPATIENT_CLINIC_OR_DEPARTMENT_OTHER): Payer: 59

## 2011-08-18 VITALS — BP 133/78 | HR 80 | Temp 98.1°F

## 2011-08-18 DIAGNOSIS — C50919 Malignant neoplasm of unspecified site of unspecified female breast: Secondary | ICD-10-CM

## 2011-08-18 DIAGNOSIS — C50912 Malignant neoplasm of unspecified site of left female breast: Secondary | ICD-10-CM

## 2011-08-18 DIAGNOSIS — C773 Secondary and unspecified malignant neoplasm of axilla and upper limb lymph nodes: Secondary | ICD-10-CM

## 2011-08-18 MED ORDER — PEGFILGRASTIM INJECTION 6 MG/0.6ML
SUBCUTANEOUS | Status: AC
  Administered 2011-08-18: 6 mg via SUBCUTANEOUS
  Filled 2011-08-18: qty 0.6

## 2011-08-18 MED ORDER — PEGFILGRASTIM INJECTION 6 MG/0.6ML
6.0000 mg | Freq: Once | SUBCUTANEOUS | Status: AC
  Administered 2011-08-18: 6 mg via SUBCUTANEOUS

## 2011-08-18 NOTE — Progress Notes (Signed)
Jessica Simpson presents today for injection per MD orders. Neulasta 6mg  administered SQ in right Abdomen. Administration without incident. Patient tolerated well.

## 2011-08-19 ENCOUNTER — Other Ambulatory Visit (HOSPITAL_COMMUNITY): Payer: Self-pay | Admitting: Oncology

## 2011-08-19 DIAGNOSIS — C50912 Malignant neoplasm of unspecified site of left female breast: Secondary | ICD-10-CM

## 2011-08-31 ENCOUNTER — Encounter (HOSPITAL_BASED_OUTPATIENT_CLINIC_OR_DEPARTMENT_OTHER): Payer: 59 | Admitting: Oncology

## 2011-08-31 ENCOUNTER — Encounter (HOSPITAL_COMMUNITY): Payer: 59 | Attending: Oncology

## 2011-08-31 VITALS — BP 138/80 | HR 77 | Temp 97.1°F | Ht 65.0 in | Wt 197.0 lb

## 2011-08-31 DIAGNOSIS — C50912 Malignant neoplasm of unspecified site of left female breast: Secondary | ICD-10-CM

## 2011-08-31 DIAGNOSIS — C50919 Malignant neoplasm of unspecified site of unspecified female breast: Secondary | ICD-10-CM

## 2011-08-31 DIAGNOSIS — C773 Secondary and unspecified malignant neoplasm of axilla and upper limb lymph nodes: Secondary | ICD-10-CM

## 2011-08-31 DIAGNOSIS — Z5111 Encounter for antineoplastic chemotherapy: Secondary | ICD-10-CM

## 2011-08-31 LAB — COMPREHENSIVE METABOLIC PANEL
Alkaline Phosphatase: 95 U/L (ref 39–117)
BUN: 7 mg/dL (ref 6–23)
Calcium: 9.2 mg/dL (ref 8.4–10.5)
GFR calc Af Amer: >90 mL/min (ref >90)
Glucose, Bld: 108 mg/dL — ABNORMAL HIGH (ref 70–99)
Total Protein: 7.1 g/dL (ref 6.0–8.3)

## 2011-08-31 LAB — DIFFERENTIAL
Basophils Absolute: 0.1 10*3/uL (ref 0.0–0.1)
Eosinophils Absolute: 0 10*3/uL (ref 0.0–0.7)
Lymphs Abs: 1.1 10*3/uL (ref 0.7–4.0)
Neutro Abs: 3.2 10*3/uL (ref 1.7–7.7)

## 2011-08-31 LAB — CBC
HCT: 34.8 % — ABNORMAL LOW (ref 36.0–46.0)
MCHC: 33 g/dL (ref 30.0–36.0)
MCV: 80.7 fL (ref 78.0–100.0)
RDW: 14.9 % (ref 11.5–15.5)

## 2011-08-31 MED ORDER — SODIUM CHLORIDE 0.9 % IV SOLN
Freq: Once | INTRAVENOUS | Status: AC
  Administered 2011-08-31: 11:00:00 via INTRAVENOUS
  Filled 2011-08-31: qty 5

## 2011-08-31 MED ORDER — SODIUM CHLORIDE 0.9 % IV SOLN
500.0000 mg/m2 | Freq: Once | INTRAVENOUS | Status: AC
  Administered 2011-08-31: 1020 mg via INTRAVENOUS
  Filled 2011-08-31: qty 51

## 2011-08-31 MED ORDER — SODIUM CHLORIDE 0.9 % IJ SOLN
10.0000 mL | INTRAMUSCULAR | Status: DC | PRN
  Administered 2011-08-31: 10 mL
  Filled 2011-08-31: qty 10

## 2011-08-31 MED ORDER — FLUOROURACIL CHEMO INJECTION 2.5 GM/50ML
500.0000 mg/m2 | Freq: Once | INTRAVENOUS | Status: AC
  Administered 2011-08-31: 1000 mg via INTRAVENOUS
  Filled 2011-08-31: qty 20

## 2011-08-31 MED ORDER — DEXAMETHASONE SODIUM PHOSPHATE 4 MG/ML IJ SOLN: 12.0000 mg | Freq: Once | INTRAMUSCULAR | Status: DC

## 2011-08-31 MED ORDER — PALONOSETRON HCL INJECTION 0.25 MG/5ML
0.2500 mg | Freq: Once | INTRAVENOUS | Status: AC
  Administered 2011-08-31: 0.25 mg via INTRAVENOUS
  Filled 2011-08-31: qty 5

## 2011-08-31 MED ORDER — HEPARIN SOD (PORK) LOCK FLUSH 100 UNIT/ML IV SOLN
500.0000 [IU] | Freq: Once | INTRAVENOUS | Status: AC | PRN
  Administered 2011-08-31: 500 [IU]
  Filled 2011-08-31: qty 5

## 2011-08-31 MED ORDER — SODIUM CHLORIDE 0.9 % IV SOLN
Freq: Once | INTRAVENOUS | Status: AC
  Administered 2011-08-31: 500 mL via INTRAVENOUS

## 2011-08-31 MED ORDER — EPIRUBICIN HCL CHEMO IV INJECTION 200 MG/100ML
100.0000 mg/m2 | Freq: Once | INTRAVENOUS | Status: AC
  Administered 2011-08-31: 204 mg via INTRAVENOUS
  Filled 2011-08-31 (×3): qty 102

## 2011-08-31 MED ORDER — SODIUM CHLORIDE 0.9 % IV SOLN: 150.0000 mg | Freq: Once | INTRAVENOUS | Status: DC

## 2011-08-31 NOTE — Progress Notes (Signed)
[facility] Discharge Instructions for Patients Receiving Chemotherapy  Today you received the following chemotherapy agents epirubicen, 65fu and cytoxan  To help prevent nausea and vomiting after your treatment, we encourage you to take your nausea medication Decadron 4 mg tabs take 2 tomorrow then 2 twice a day on Thurs and Friday. Then take zofran twice a day as needed for nausea and ativan every 3-4 hrs as needed for nausea.   If you develop nausea and vomiting that is not controlled by your nausea medication, call the clinic. If it is after clinic hours your family physician or the after hours number for the clinic or go to the Emergency Department.   BELOW ARE SYMPTOMS THAT SHOULD BE REPORTED IMMEDIATELY:  *FEVER GREATER THAN 101.0 F  *CHILLS WITH OR WITHOUT FEVER  NAUSEA AND VOMITING THAT IS NOT CONTROLLED WITH YOUR NAUSEA MEDICATION  *UNUSUAL SHORTNESS OF BREATH  *UNUSUAL BRUISING OR BLEEDING  TENDERNESS IN MOUTH AND THROAT WITH OR WITHOUT PRESENCE OF ULCERS  *URINARY PROBLEMS  *BOWEL PROBLEMS  UNUSUAL RASH Items with * indicate a potential emergency and should be followed up as soon as possible.  One of the nurses will contact you 24 hours after your treatment. Please let the nurse know about any problems that you may have experienced. Feel free to call the clinic you have any questions or concerns. The clinic phone number is [phone].   I have been informed and understand all the instructions given to me. I know to contact the clinic, my physician, or go to the Emergency Department if any problems should occur. I do not have any questions at this time, but understand that I may call the clinic during office hours or the Patient Navigator at [phone] should I have any questions or need assistance in obtaining follow up care.    __________________________________________  _____________  __________ Signature of Patient or Authorized  Representative            Date                   Time    __________________________________________ Nurse's Signature

## 2011-08-31 NOTE — Progress Notes (Signed)
CC:   Jessica Simpson, M.D. Grayland Jack, M.D. Willeen Niece, MD  DIAGNOSIS:  Stage III left-sided triple-negative breast cancer with a large 7 cm mass and clinically positive and radiographically positive lymph node in the left axilla.  She is now status post 2 cycles of chemotherapy and is here today for cycle 3.  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  She is being treated presently with Cytoxan, epirubicin and 5- fluorouracil every 14 days in a dose-dense fashion.  Her labs from today show a normal white count, normal platelet count and a hemoglobin of 11.5 g.  Electrolytes are normal.  Calcium is normal. Liver enzymes remain normal.  She is having a little bit of diarrhea for 3 or 4 days after chemotherapy, sometimes as many as 3-5 stools a day.  The first stool is usually normal.  There is no blood in her stools.  She is never constipated, she states.  The stools then quiet down, and she sometimes has 2 or 3 a day. She has maintained a very good weight, and there is no real weight loss.  She is not having fevers or chills, no night sweats, but she thinks she is coming down with a cold, she is a little stuffy, she thinks, but there is no yellow or green phlegm, no coughing up of phlegm, etc.  PHYSICAL EXAMINATION:  General:  She is in no acute distress.  Vital Signs:  She is afebrile today.  Blood pressure is a little high at 192/94, but she is anxious at times.  Her weight is 197 pounds on a 5 foot 5 inch frame.  That is not different than in October with her last cycle.  She has a pulse which is 88-92 and regular, respirations 16-18 and unlabored.  She is not having any pain.  Lymphatic:  She has no supraclavicular nodes, no cervical nodes, no infraclavicular nodes, and her left axillary node I can no longer feel.  Breasts:  The left upper outer quadrant breast mass is now about 4 x 4 cm, much more  difficult to feel, not as prominent but still present.  The port in the right upper chest wall is fine.  Heart:  Shows a regular rhythm and rate without murmur, rub or gallop.  Lungs:  Clear to auscultation and percussion. Abdomen: Soft, nontender, without organomegaly.  Extremities:  She has no peripheral edema of the arms or legs.  She actually looks good.  Her hair, of course, is gone.  She is wearing a wig.  So she is here for cycle 3 and doing very, very well.  She is having a tremendous response so far after 2 cycles only.  We will keep up her chemotherapy schedule as planned.  We had that planned for 4 cycles and then will go to dose-dense docetaxel.  If she cannot tolerate dose-dense docetaxel, we will switch her to every 21 days.  She is going to have an appointment with Genetics on Monday.  It was rescheduled from yesterday to 1 week later, and she will also see a strictly breast cancer surgeon [facility], I believe on the 14th, for a second opinion.  She will then decide what surgeon she wants to utilize.  I think the genetics makes a big difference as to what kind of surgery she can have and, of course, her response to therapy.  She is going to see, I believe, Dr. Avis Epley in  followup sometime in November.  So I will see her one way or the other in 4 weeks.    ______________________________ Ladona Horns. Mariel Sleet, MD ESN/MEDQ  D:  08/31/2011  T:  08/31/2011  Job:  [id number]

## 2011-08-31 NOTE — Progress Notes (Signed)
This office note has been dictated.

## 2011-09-01 ENCOUNTER — Encounter (HOSPITAL_BASED_OUTPATIENT_CLINIC_OR_DEPARTMENT_OTHER): Payer: 59

## 2011-09-01 DIAGNOSIS — C773 Secondary and unspecified malignant neoplasm of axilla and upper limb lymph nodes: Secondary | ICD-10-CM

## 2011-09-01 DIAGNOSIS — C50912 Malignant neoplasm of unspecified site of left female breast: Secondary | ICD-10-CM

## 2011-09-01 DIAGNOSIS — C50919 Malignant neoplasm of unspecified site of unspecified female breast: Secondary | ICD-10-CM

## 2011-09-01 MED ORDER — PEGFILGRASTIM INJECTION 6 MG/0.6ML
SUBCUTANEOUS | Status: AC
  Administered 2011-09-01: 6 mg via SUBCUTANEOUS
  Filled 2011-09-01: qty 0.6

## 2011-09-01 MED ORDER — PEGFILGRASTIM INJECTION 6 MG/0.6ML
6.0000 mg | Freq: Once | SUBCUTANEOUS | Status: AC
  Administered 2011-09-01: 6 mg via SUBCUTANEOUS

## 2011-09-01 NOTE — Progress Notes (Signed)
Jessica Simpson presents today for injection per MD orders. Neulasta 6mg  administered SQ in left Abdomen. Administration without incident. Patient tolerated well.

## 2011-09-06 ENCOUNTER — Telehealth (HOSPITAL_COMMUNITY): Payer: Self-pay | Admitting: *Deleted

## 2011-09-06 NOTE — Telephone Encounter (Signed)
Patient just wanted to be sure that it is normal for her menstrual cycle to be a little prolonged d/t chemo and it has been heavier. She is having diarrhea every time she eats. I told her to take Imodium. I told her I would run the menstrual cycle question by you. -

## 2011-09-06 NOTE — Telephone Encounter (Signed)
Yes.  No worry, but we will need to perform a CBC prior to chemo to check Hgb.  It may be a coincidence that she had a heavy flow following chemo.  Keep [place] in the loop and run this past Neijstrom quickly too to make sure I am not missing anything.  If this become a problem (ie low Hgb due to period) may need to consider birth Control

## 2011-09-14 ENCOUNTER — Encounter (HOSPITAL_BASED_OUTPATIENT_CLINIC_OR_DEPARTMENT_OTHER): Payer: 59

## 2011-09-14 VITALS — BP 153/87 | HR 97 | Temp 97.8°F | Ht 65.0 in | Wt 198.0 lb

## 2011-09-14 DIAGNOSIS — Z5111 Encounter for antineoplastic chemotherapy: Secondary | ICD-10-CM

## 2011-09-14 DIAGNOSIS — C773 Secondary and unspecified malignant neoplasm of axilla and upper limb lymph nodes: Secondary | ICD-10-CM

## 2011-09-14 DIAGNOSIS — C50919 Malignant neoplasm of unspecified site of unspecified female breast: Secondary | ICD-10-CM

## 2011-09-14 DIAGNOSIS — C50912 Malignant neoplasm of unspecified site of left female breast: Secondary | ICD-10-CM

## 2011-09-14 LAB — COMPREHENSIVE METABOLIC PANEL
ALT: 14 U/L (ref 0–35)
AST: 15 U/L (ref 0–37)
Albumin: 3.7 g/dL (ref 3.5–5.2)
Alkaline Phosphatase: 90 U/L (ref 39–117)
CO2: 27 mEq/L (ref 19–32)
Chloride: 105 mEq/L (ref 96–112)
Creatinine, Ser: 0.82 mg/dL (ref 0.50–1.10)
Potassium: 3.5 mEq/L (ref 3.5–5.1)
Sodium: 141 mEq/L (ref 135–145)
Total Bilirubin: 0.1 mg/dL — ABNORMAL LOW (ref 0.3–1.2)

## 2011-09-14 LAB — CBC
MCHC: 32.6 g/dL (ref 30.0–36.0)
Platelets: 178 10*3/uL (ref 150–400)
RDW: 15.3 % (ref 11.5–15.5)
WBC: 5.1 10*3/uL (ref 4.0–10.5)

## 2011-09-14 LAB — DIFFERENTIAL
Basophils Absolute: 0.1 10*3/uL (ref 0.0–0.1)
Basophils Relative: 1 % (ref 0–1)
Lymphocytes Relative: 22 % (ref 12–46)
Neutro Abs: 3.2 10*3/uL (ref 1.7–7.7)
Neutrophils Relative %: 62 % (ref 43–77)

## 2011-09-14 MED ORDER — SODIUM CHLORIDE 0.9 % IV SOLN
Freq: Once | INTRAVENOUS | Status: AC
  Administered 2011-09-14: 12:00:00 via INTRAVENOUS
  Filled 2011-09-14: qty 5

## 2011-09-14 MED ORDER — SODIUM CHLORIDE 0.9 % IV SOLN: 150.0000 mg | Freq: Once | INTRAVENOUS | Status: DC

## 2011-09-14 MED ORDER — HEPARIN SOD (PORK) LOCK FLUSH 100 UNIT/ML IV SOLN
INTRAVENOUS | Status: AC
  Administered 2011-09-14: 500 [IU]
  Filled 2011-09-14: qty 5

## 2011-09-14 MED ORDER — EPIRUBICIN HCL CHEMO IV INJECTION 200 MG/100ML
100.0000 mg/m2 | Freq: Once | INTRAVENOUS | Status: AC
  Administered 2011-09-14: 204 mg via INTRAVENOUS
  Filled 2011-09-14 (×3): qty 102

## 2011-09-14 MED ORDER — PALONOSETRON HCL INJECTION 0.25 MG/5ML
0.2500 mg | Freq: Once | INTRAVENOUS | Status: AC
  Administered 2011-09-14: 0.25 mg via INTRAVENOUS
  Filled 2011-09-14: qty 5

## 2011-09-14 MED ORDER — SODIUM CHLORIDE 0.9 % IV SOLN
Freq: Once | INTRAVENOUS | Status: AC
  Administered 2011-09-14: 10:00:00 via INTRAVENOUS

## 2011-09-14 MED ORDER — SODIUM CHLORIDE 0.9 % IJ SOLN
INTRAMUSCULAR | Status: AC
  Filled 2011-09-14: qty 10

## 2011-09-14 MED ORDER — FLUOROURACIL CHEMO INJECTION 2.5 GM/50ML
500.0000 mg/m2 | Freq: Once | INTRAVENOUS | Status: AC
  Administered 2011-09-14: 1000 mg via INTRAVENOUS
  Filled 2011-09-14: qty 20

## 2011-09-14 MED ORDER — DEXAMETHASONE SODIUM PHOSPHATE 4 MG/ML IJ SOLN: 12.0000 mg | Freq: Once | INTRAMUSCULAR | Status: DC

## 2011-09-14 MED ORDER — SODIUM CHLORIDE 0.9 % IV SOLN
500.0000 mg/m2 | Freq: Once | INTRAVENOUS | Status: AC
  Administered 2011-09-14: 1020 mg via INTRAVENOUS
  Filled 2011-09-14: qty 51

## 2011-09-14 MED ORDER — HEPARIN SOD (PORK) LOCK FLUSH 100 UNIT/ML IV SOLN
500.0000 [IU] | Freq: Once | INTRAVENOUS | Status: AC | PRN
  Administered 2011-09-14: 500 [IU]
  Filled 2011-09-14: qty 5

## 2011-09-14 NOTE — Progress Notes (Signed)
Tolerated chemo well. 

## 2011-09-15 ENCOUNTER — Encounter (HOSPITAL_BASED_OUTPATIENT_CLINIC_OR_DEPARTMENT_OTHER): Payer: 59

## 2011-09-15 DIAGNOSIS — C50919 Malignant neoplasm of unspecified site of unspecified female breast: Secondary | ICD-10-CM

## 2011-09-15 DIAGNOSIS — C50912 Malignant neoplasm of unspecified site of left female breast: Secondary | ICD-10-CM

## 2011-09-15 DIAGNOSIS — C773 Secondary and unspecified malignant neoplasm of axilla and upper limb lymph nodes: Secondary | ICD-10-CM

## 2011-09-15 MED ORDER — PEGFILGRASTIM INJECTION 6 MG/0.6ML
6.0000 mg | Freq: Once | SUBCUTANEOUS | Status: AC
  Administered 2011-09-15: 6 mg via SUBCUTANEOUS

## 2011-09-15 MED ORDER — PEGFILGRASTIM INJECTION 6 MG/0.6ML
SUBCUTANEOUS | Status: AC
  Filled 2011-09-15: qty 0.6

## 2011-09-15 NOTE — Progress Notes (Signed)
Jessica Simpson presents today for injection per MD orders. Neulasta 6mg administered SQ in left Abdomen. Administration without incident. Patient tolerated well.  

## 2011-09-27 ENCOUNTER — Encounter (HOSPITAL_COMMUNITY): Payer: 59 | Attending: Hematology and Oncology

## 2011-09-27 ENCOUNTER — Encounter (HOSPITAL_COMMUNITY): Payer: Self-pay

## 2011-09-27 ENCOUNTER — Other Ambulatory Visit (HOSPITAL_COMMUNITY): Payer: Self-pay | Admitting: Oncology

## 2011-09-27 VITALS — BP 141/83 | HR 70 | Temp 97.6°F | Wt 200.0 lb

## 2011-09-27 DIAGNOSIS — C773 Secondary and unspecified malignant neoplasm of axilla and upper limb lymph nodes: Secondary | ICD-10-CM

## 2011-09-27 DIAGNOSIS — C50919 Malignant neoplasm of unspecified site of unspecified female breast: Secondary | ICD-10-CM | POA: Insufficient documentation

## 2011-09-27 LAB — CBC
HCT: 32.6 % — ABNORMAL LOW (ref 36.0–46.0)
MCH: 27.1 pg (ref 26.0–34.0)
MCHC: 33.1 g/dL (ref 30.0–36.0)
MCV: 81.9 fL (ref 78.0–100.0)
RDW: 16.2 % — ABNORMAL HIGH (ref 11.5–15.5)

## 2011-09-27 LAB — COMPREHENSIVE METABOLIC PANEL
Albumin: 3.9 g/dL (ref 3.5–5.2)
BUN: 10 mg/dL (ref 6–23)
Calcium: 9.6 mg/dL (ref 8.4–10.5)
Chloride: 104 mEq/L (ref 96–112)
Creatinine, Ser: 1.09 mg/dL (ref 0.50–1.10)
Total Bilirubin: 0.2 mg/dL — ABNORMAL LOW (ref 0.3–1.2)
Total Protein: 7 g/dL (ref 6.0–8.3)

## 2011-09-27 NOTE — Progress Notes (Signed)
This office note has been dictated.

## 2011-09-27 NOTE — Progress Notes (Signed)
Jessica Simpson presented for labwork. Labs per MD order drawn via Peripheral Line 23 gauge needle inserted in rt forarm  Good blood return present. Procedure without incident.  Needle removed intact. Patient tolerated procedure well.

## 2011-09-27 NOTE — Patient Instructions (Signed)
RHEANA CASEBOLT  [id number] [birth year]  [facility]  Discharge Instructions  RECOMMENDATIONS MADE BY THE CONSULTANT AND ANY TEST RESULTS WILL BE SENT TO YOUR REFERRING DOCTOR.   EXAM FINDINGS BY MD TODAY AND SIGNS AND SYMPTOMS TO REPORT TO CLINIC OR PRIMARY MD: you are doing well.  Will start your next chemotherapy next week.  MEDICATIONS PRESCRIBED: none   INSTRUCTIONS GIVEN AND DISCUSSED: Other :  Report shortness of breath, fevers, any new lumps or bone pain.  SPECIAL INSTRUCTIONS/FOLLOW-UP: Return to Clinic on Next week for chemotherapy.   I acknowledge that I have been informed and understand all the instructions given to me and received a copy. I do not have any more questions at this time, but understand that I may call the Specialty Clinic at [facility] at [phone] during business hours should I have any further questions or need assistance in obtaining follow-up care.    __________________________________________  _____________  __________ Signature of Patient or Authorized Representative            Date                   Time    __________________________________________ Nurse's Signature

## 2011-09-27 NOTE — Progress Notes (Signed)
CC:   F. CArbutus Ped, M.D. Grayland Jack, M.D. Willeen Niece, MD  IDENTIFYING STATEMENT:  The patient is a 40 year old woman with breast cancer who presents for followup.  HISTORY OF PRESENT ILLNESS:  [name] is completed 4 cycles of dose-dense FEC.  Besides easy fatigued and diarrhea, she states that she had done fairly well with therapy.  She has appreciated the 2-3 weeks off.  Nausea was well-controlled with current antiemetics.  She tells me that she was seen at the [facility] and results were negative.  She tells me that hopefully she will be eligible for a lumpectomy with radiation.  She denies pain.  Her weight is well-maintained.  MEDICATIONS:  Reviewed and updated.  ALLERGIES:  Codeine, Lortab.  PAST MEDICAL HISTORY/SOCIAL HISTORY/FAMILY HISTORY:  Unchanged.  REVIEW OF SYSTEMS:  A 10-point review of systems negative.  PHYSICAL EXAM:  General: The patient is alert and oriented x3.  Vitals: Pulse 70, blood pressure 141/83, temperature 97.6, respirations 18, and weight 200 pounds.  HEENT: Head is atraumatic without alopecia.  Sclerae anicteric.  Mouth moist.  Chest:  Clear to percussion auscultation. Port-A-Cath without infection.  CVS: 1st and 22nd heart sounds present. Breast exam not performed.  ABDOMEN:  Soft, nontender.  Bowel sounds present.  Extremities: No calf tenderness.  Lymph nodes: No adenopathy. CNS: Nonfocal.  LAB DATA:  Pending.  IMPRESSION AND PLAN:  Jessica Simpson is a 39 year old woman with stage III left-sided triple-negative breast cancer.  Tumor measured 7 cm, and she is known to have a clinically positive and radiographically positive lymph node in the left axilla.  She is status post 4 cycles of dose- dense FEC from 08/13/2011 through 09/14/2011.  She is scheduled to begin dose-dense Taxotere on 09/28/2011.  The patient would like to delay for another week.  Taxotere has been rescheduled to begin on October 06, 2011 followed by Neulasta support.  She follows up 2 weeks thereafter prior to cycle 2.  All questions were answered in relations to logistics and side effects of therapy.  We spent more than half the time coordinating care.    ______________________________ Laurice Record, M.D. LIO/MEDQ  D:  09/27/2011  T:  09/27/2011  Job:  [id number]

## 2011-10-04 ENCOUNTER — Encounter (HOSPITAL_BASED_OUTPATIENT_CLINIC_OR_DEPARTMENT_OTHER): Payer: 59

## 2011-10-04 VITALS — BP 159/89 | HR 72 | Temp 97.9°F | Ht 65.0 in | Wt 199.4 lb

## 2011-10-04 DIAGNOSIS — C50912 Malignant neoplasm of unspecified site of left female breast: Secondary | ICD-10-CM

## 2011-10-04 DIAGNOSIS — C50419 Malignant neoplasm of upper-outer quadrant of unspecified female breast: Secondary | ICD-10-CM

## 2011-10-04 DIAGNOSIS — Z5111 Encounter for antineoplastic chemotherapy: Secondary | ICD-10-CM

## 2011-10-04 DIAGNOSIS — C773 Secondary and unspecified malignant neoplasm of axilla and upper limb lymph nodes: Secondary | ICD-10-CM

## 2011-10-04 MED ORDER — SODIUM CHLORIDE 0.9 % IV SOLN: 8.0000 mg | Freq: Once | INTRAVENOUS | Status: DC

## 2011-10-04 MED ORDER — HEPARIN SOD (PORK) LOCK FLUSH 100 UNIT/ML IV SOLN
500.0000 [IU] | Freq: Once | INTRAVENOUS | Status: AC | PRN
  Administered 2011-10-04: 500 [IU]
  Filled 2011-10-04: qty 5

## 2011-10-04 MED ORDER — SODIUM CHLORIDE 0.9 % IJ SOLN
10.0000 mL | INTRAMUSCULAR | Status: DC | PRN
  Filled 2011-10-04: qty 10

## 2011-10-04 MED ORDER — ACETAMINOPHEN 500 MG PO TABS
ORAL_TABLET | ORAL | Status: AC
  Administered 2011-10-04: 500 mg via ORAL
  Filled 2011-10-04: qty 2

## 2011-10-04 MED ORDER — DOCETAXEL CHEMO INJECTION 160 MG/16ML
75.0000 mg/m2 | Freq: Once | INTRAVENOUS | Status: AC
  Administered 2011-10-04: 150 mg via INTRAVENOUS
  Filled 2011-10-04 (×3): qty 15

## 2011-10-04 MED ORDER — DEXAMETHASONE SODIUM PHOSPHATE 10 MG/ML IJ SOLN: 10.0000 mg | Freq: Once | INTRAMUSCULAR | Status: DC

## 2011-10-04 MED ORDER — ACETAMINOPHEN 325 MG PO TABS
650.0000 mg | ORAL_TABLET | Freq: Once | ORAL | Status: AC
  Administered 2011-10-04: 500 mg via ORAL
  Filled 2011-10-04: qty 2

## 2011-10-04 MED ORDER — SODIUM CHLORIDE 0.9 % IV SOLN
Freq: Once | INTRAVENOUS | Status: AC
  Administered 2011-10-04: 10:00:00 via INTRAVENOUS

## 2011-10-04 MED ORDER — SODIUM CHLORIDE 0.9 % IV SOLN
Freq: Once | INTRAVENOUS | Status: AC
  Administered 2011-10-04: 8 mg via INTRAVENOUS
  Filled 2011-10-04: qty 4

## 2011-10-04 MED ORDER — HEPARIN SOD (PORK) LOCK FLUSH 100 UNIT/ML IV SOLN
INTRAVENOUS | Status: AC
  Administered 2011-10-04: 500 [IU]
  Filled 2011-10-04: qty 5

## 2011-10-04 NOTE — Progress Notes (Signed)
C/o h/a at 1200. Tylenol 500 mh given po. experssed relief from h/a about an hour later. Tolerated taxotere well.

## 2011-10-05 ENCOUNTER — Encounter (HOSPITAL_BASED_OUTPATIENT_CLINIC_OR_DEPARTMENT_OTHER): Payer: 59

## 2011-10-05 VITALS — BP 153/89 | HR 79 | Temp 98.2°F

## 2011-10-05 DIAGNOSIS — C773 Secondary and unspecified malignant neoplasm of axilla and upper limb lymph nodes: Secondary | ICD-10-CM

## 2011-10-05 DIAGNOSIS — C50912 Malignant neoplasm of unspecified site of left female breast: Secondary | ICD-10-CM

## 2011-10-05 DIAGNOSIS — C50919 Malignant neoplasm of unspecified site of unspecified female breast: Secondary | ICD-10-CM

## 2011-10-05 DIAGNOSIS — Z5189 Encounter for other specified aftercare: Secondary | ICD-10-CM

## 2011-10-05 MED ORDER — PEGFILGRASTIM INJECTION 6 MG/0.6ML
SUBCUTANEOUS | Status: AC
  Administered 2011-10-05: 6 mg via SUBCUTANEOUS
  Filled 2011-10-05: qty 0.6

## 2011-10-05 MED ORDER — PEGFILGRASTIM INJECTION 6 MG/0.6ML
6.0000 mg | Freq: Once | SUBCUTANEOUS | Status: AC
  Administered 2011-10-05: 6 mg via SUBCUTANEOUS

## 2011-10-05 NOTE — Progress Notes (Signed)
Jessica Simpson presents today for injection per MD orders. Neulasta 6mg administered SQ in left Abdomen. Administration without incident. Patient tolerated well.  

## 2011-10-08 ENCOUNTER — Telehealth (HOSPITAL_COMMUNITY): Payer: Self-pay | Admitting: *Deleted

## 2011-10-08 NOTE — Telephone Encounter (Signed)
Message left

## 2011-10-20 ENCOUNTER — Telehealth (HOSPITAL_COMMUNITY): Payer: Self-pay | Admitting: *Deleted

## 2011-10-20 ENCOUNTER — Ambulatory Visit (HOSPITAL_COMMUNITY): Payer: 59 | Admitting: Oncology

## 2011-10-20 ENCOUNTER — Encounter (HOSPITAL_BASED_OUTPATIENT_CLINIC_OR_DEPARTMENT_OTHER): Payer: 59

## 2011-10-20 DIAGNOSIS — C50419 Malignant neoplasm of upper-outer quadrant of unspecified female breast: Secondary | ICD-10-CM

## 2011-10-20 DIAGNOSIS — C50919 Malignant neoplasm of unspecified site of unspecified female breast: Secondary | ICD-10-CM

## 2011-10-20 LAB — DIFFERENTIAL
Basophils Absolute: 0.1 10*3/uL (ref 0.0–0.1)
Eosinophils Absolute: 0.1 10*3/uL (ref 0.0–0.7)
Lymphs Abs: 1 10*3/uL (ref 0.7–4.0)
Monocytes Relative: 17 % — ABNORMAL HIGH (ref 3–12)

## 2011-10-20 LAB — COMPREHENSIVE METABOLIC PANEL
ALT: 18 U/L (ref 0–35)
AST: 16 U/L (ref 0–37)
Albumin: 3.5 g/dL (ref 3.5–5.2)
CO2: 25 mEq/L (ref 19–32)
Calcium: 9.3 mg/dL (ref 8.4–10.5)
Chloride: 105 mEq/L (ref 96–112)
GFR calc non Af Amer: >90 mL/min (ref >90)
Sodium: 139 mEq/L (ref 135–145)
Total Bilirubin: 0.2 mg/dL — ABNORMAL LOW (ref 0.3–1.2)

## 2011-10-20 LAB — CBC
MCHC: 32.1 g/dL (ref 30.0–36.0)
MCV: 85.1 fL (ref 78.0–100.0)
Platelets: 193 10*3/uL (ref 150–400)
RDW: 17.4 % — ABNORMAL HIGH (ref 11.5–15.5)
WBC: 5.4 10*3/uL (ref 4.0–10.5)

## 2011-10-20 NOTE — Telephone Encounter (Signed)
Patient coming tomorrow for Taxol #2 and she was just a little bit emotional today. She is concerned about the numbness in the tips of her fingers and in her toes . Her feet are also very sensitive. She is a little worried because she has only had 1 treatment of Taxol.  She is also having hot flashes - which we already addressed with her today. She is also having pain in her left breast that started after the 1st Taxol treatment. This also occurred after her first EC treatment. So she is concerned about this. The pain starts around her lt lymph node area under armpit throughout her left lateral breast tissue. Just an Lorain Childes because she sees you on Friday.

## 2011-10-20 NOTE — Progress Notes (Signed)
Addended by: Oda Kilts on: 10/20/2011 04:47 PM   Modules accepted: Orders

## 2011-10-20 NOTE — Progress Notes (Signed)
Jessica Simpson presented for labwork. Labs per MD order drawn via Peripheral Line 24 gauge needle inserted in  Rt ac Good blood return present. Procedure without incident.  Needle removed intact. Patient tolerated procedure well.  Cbc diff/cmet drawn

## 2011-10-21 ENCOUNTER — Other Ambulatory Visit (HOSPITAL_COMMUNITY): Payer: 59

## 2011-10-21 ENCOUNTER — Encounter (HOSPITAL_BASED_OUTPATIENT_CLINIC_OR_DEPARTMENT_OTHER): Payer: 59

## 2011-10-21 VITALS — BP 141/83 | HR 82 | Temp 98.0°F | Ht 65.0 in | Wt 201.0 lb

## 2011-10-21 DIAGNOSIS — C773 Secondary and unspecified malignant neoplasm of axilla and upper limb lymph nodes: Secondary | ICD-10-CM

## 2011-10-21 DIAGNOSIS — C50419 Malignant neoplasm of upper-outer quadrant of unspecified female breast: Secondary | ICD-10-CM

## 2011-10-21 DIAGNOSIS — C50912 Malignant neoplasm of unspecified site of left female breast: Secondary | ICD-10-CM

## 2011-10-21 DIAGNOSIS — Z5111 Encounter for antineoplastic chemotherapy: Secondary | ICD-10-CM

## 2011-10-21 MED ORDER — DEXAMETHASONE SODIUM PHOSPHATE 10 MG/ML IJ SOLN: 10.0000 mg | Freq: Once | INTRAMUSCULAR | Status: DC

## 2011-10-21 MED ORDER — SODIUM CHLORIDE 0.9 % IV SOLN
Freq: Once | INTRAVENOUS | Status: AC
  Administered 2011-10-21: 8 mg via INTRAVENOUS
  Filled 2011-10-21: qty 4

## 2011-10-21 MED ORDER — HEPARIN SOD (PORK) LOCK FLUSH 100 UNIT/ML IV SOLN
INTRAVENOUS | Status: AC
  Filled 2011-10-21: qty 5

## 2011-10-21 MED ORDER — SODIUM CHLORIDE 0.9 % IV SOLN: 8.0000 mg | Freq: Once | INTRAVENOUS | Status: DC

## 2011-10-21 MED ORDER — SODIUM CHLORIDE 0.9 % IV SOLN
Freq: Once | INTRAVENOUS | Status: AC
  Administered 2011-10-21: 11:00:00 via INTRAVENOUS

## 2011-10-21 MED ORDER — DOCETAXEL CHEMO INJECTION 160 MG/16ML
75.0000 mg/m2 | Freq: Once | INTRAVENOUS | Status: AC
  Administered 2011-10-21: 150 mg via INTRAVENOUS
  Filled 2011-10-21: qty 15

## 2011-10-21 MED ORDER — SODIUM CHLORIDE 0.9 % IJ SOLN
10.0000 mL | INTRAMUSCULAR | Status: DC | PRN
  Administered 2011-10-21 (×2): 10 mL
  Filled 2011-10-21: qty 10

## 2011-10-21 MED ORDER — HEPARIN SOD (PORK) LOCK FLUSH 100 UNIT/ML IV SOLN
500.0000 [IU] | Freq: Once | INTRAVENOUS | Status: AC | PRN
  Administered 2011-10-21: 500 [IU]
  Filled 2011-10-21: qty 5

## 2011-10-21 NOTE — Progress Notes (Signed)
Tolerated well

## 2011-10-21 NOTE — Progress Notes (Signed)
[facility] Discharge Instructions for Patients Receiving Chemotherapy  Today you received the following chemotherapy agents taxotere.  To help prevent nausea and vomiting after your treatment, we encourage you to take your nausea medication Decadron--Take 1 tablet tonight by mouth and 1 tablet in am with food. I will check with Tom/Dr. Mariel Sleet to see if you need to start this the day before chemo next time. Ativan or lorazepam-- Take 1 tablet (1 mg total) by mouth every 3 (three) hours as needed (Nausea or vomiting). zofran--Take 1 tablet two times a day as needed for nausea or vomiting starting on the third day after chemotherapy   If you develop nausea and vomiting that is not controlled by your nausea medication, call the clinic. If it is after clinic hours your family physician or the after hours number for the clinic or go to the Emergency Department.   BELOW ARE SYMPTOMS THAT SHOULD BE REPORTED IMMEDIATELY:  *FEVER GREATER THAN 101.0 F  *CHILLS WITH OR WITHOUT FEVER  NAUSEA AND VOMITING THAT IS NOT CONTROLLED WITH YOUR NAUSEA MEDICATION  *UNUSUAL SHORTNESS OF BREATH  *UNUSUAL BRUISING OR BLEEDING  TENDERNESS IN MOUTH AND THROAT WITH OR WITHOUT PRESENCE OF ULCERS  *URINARY PROBLEMS  *BOWEL PROBLEMS  UNUSUAL RASH Items with * indicate a potential emergency and should be followed up as soon as possible.  One of the nurses will contact you 24 hours after your treatment. Please let the nurse know about any problems that you may have experienced. Feel free to call the clinic you have any questions or concerns. The clinic phone number is [phone].   I have been informed and understand all the instructions given to me. I know to contact the clinic, my physician, or go to the Emergency Department if any problems should occur. I do not have any questions at this time, but understand that I may call the clinic during office hours or the Patient Navigator at  [phone] should I have any questions or need assistance in obtaining follow up care.    __________________________________________  _____________  __________ Signature of Patient or Authorized Representative            Date                   Time    __________________________________________ Nurse's Signature

## 2011-10-22 ENCOUNTER — Encounter (HOSPITAL_COMMUNITY): Payer: Self-pay | Admitting: Oncology

## 2011-10-22 ENCOUNTER — Encounter (HOSPITAL_BASED_OUTPATIENT_CLINIC_OR_DEPARTMENT_OTHER): Payer: 59 | Admitting: Oncology

## 2011-10-22 ENCOUNTER — Encounter (HOSPITAL_COMMUNITY): Payer: 59

## 2011-10-22 ENCOUNTER — Ambulatory Visit (HOSPITAL_COMMUNITY)
Admission: RE | Admit: 2011-10-22 | Discharge: 2011-10-22 | Disposition: A | Discharge status: Home / Self Care | Payer: 59 | Source: Ambulatory Visit | Attending: Oncology | Admitting: Oncology

## 2011-10-22 ENCOUNTER — Ambulatory Visit (HOSPITAL_COMMUNITY): Payer: 59 | Admitting: Oncology

## 2011-10-22 VITALS — BP 138/79 | HR 67 | Temp 97.6°F | Wt 201.2 lb

## 2011-10-22 DIAGNOSIS — C50912 Malignant neoplasm of unspecified site of left female breast: Secondary | ICD-10-CM

## 2011-10-22 DIAGNOSIS — I517 Cardiomegaly: Secondary | ICD-10-CM

## 2011-10-22 DIAGNOSIS — Z1371 Encounter for nonprocreative screening for genetic disease carrier status: Secondary | ICD-10-CM

## 2011-10-22 DIAGNOSIS — Z9221 Personal history of antineoplastic chemotherapy: Secondary | ICD-10-CM | POA: Insufficient documentation

## 2011-10-22 DIAGNOSIS — Z5189 Encounter for other specified aftercare: Secondary | ICD-10-CM

## 2011-10-22 DIAGNOSIS — C50419 Malignant neoplasm of upper-outer quadrant of unspecified female breast: Secondary | ICD-10-CM

## 2011-10-22 DIAGNOSIS — C50919 Malignant neoplasm of unspecified site of unspecified female breast: Secondary | ICD-10-CM | POA: Insufficient documentation

## 2011-10-22 DIAGNOSIS — C773 Secondary and unspecified malignant neoplasm of axilla and upper limb lymph nodes: Secondary | ICD-10-CM

## 2011-10-22 HISTORY — DX: Encounter for nonprocreative screening for genetic disease carrier status: Z13.71

## 2011-10-22 MED ORDER — PEGFILGRASTIM INJECTION 6 MG/0.6ML
SUBCUTANEOUS | Status: AC
  Filled 2011-10-22: qty 0.6

## 2011-10-22 MED ORDER — PEGFILGRASTIM INJECTION 6 MG/0.6ML
6.0000 mg | Freq: Once | SUBCUTANEOUS | Status: AC
  Administered 2011-10-22: 6 mg via SUBCUTANEOUS

## 2011-10-22 NOTE — Progress Notes (Signed)
*  PRELIMINARY RESULTS* Echocardiogram 2D Echocardiogram has been performed.  Jessica Simpson 10/22/2011, 11:41 AM

## 2011-10-22 NOTE — Progress Notes (Signed)
Maximiano Coss, MD [address] [place] [address]  1. Cancer of left breast  SCHEDULING COMMUNICATION INJECTION, pegfilgrastim (NEULASTA) injection 6 mg, CBC, Differential, Basic metabolic panel, CBC, Differential, Comprehensive metabolic panel  2. BRCA1 negative    3. BRCA2 negative      CURRENT THERAPY:S/P 4 cycles of dose-dense FEC and now 2 cycles of dose-dense taxotere.  She began chemotherapy on 08/03/2011.   INTERVAL HISTORY: Jessica Simpson 40 y.o. 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  The patient reports 2 complaints she would like to discuss.  The first one is her hot flashes. She reports that she has these intermittently throughout the day. She reports that these are frustrating. Unfortunately, we are unable to prevent this symptom particularly since she is premenopausal.  Patient also reports some peripheral neuropathy. She reports that it is appreciated in her fingertips, toes, and heel of foot. She explains that these areas are simply tender. She remains able to button buttons and collect flat objects such as a coin off of a flat surface. She simply reports that her fingertips are tender with deep palpation. She is able to ambulate without difficulties. She therefore has a grade 1 peripheral neuropathy. We spent some time going over patient education regarding this diagnosis. She will continue to report any changes regarding this symptom.  For the time being, this symptom is manageable. We did discuss a gabapentin therapy, but at this point in time we will not initiate this medication.  The patient reports that she will be using Dr. Cyndia Bent for her lumpectomy procedure. She has seen Dr. Lonie Peak in the past regarding radiation therapy. Dr. Mariel Sleet reports that he will get in touch with Dr.  Jamey Ripa regarding this patient. In the interim, I have asked her scheduler to make an appointment for the patient to be seen by Dr. Jamey Ripa the first or second week of January.  The patient is due for her third cycle of Taxotere the second week in January. 14 days later, she will undergo her fourth cycle of Taxotere which is her final cycle of chemotherapy. The plan is for her to then undergo a lumpectomy followed by radiation therapy.  Otherwise, the patient denies any complaints.   Past Medical History  Diagnosis Date  . Breast cancer   . BRCA1 negative 10/22/2011  . BRCA2 negative 10/22/2011    has Cancer of left breast; Chemotherapy-induced neutropenia; BRCA1 negative; and BRCA2 negative on her problem list.     is allergic to codeine and lortab.  We administered pegfilgrastim.  Past Surgical History  Procedure Date  . Removal breast mass 2004    benign  . Left breast biopsy with axillary biopsy     core bx  . Portacath placement 08/02/2011    dr Jamey Ripa  . Tumor removal     on left hand  . Tumor removal     rt. eye  . 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   PHYSICAL EXAMINATION  ECOG PERFORMANCE STATUS: 1 - Symptomatic but completely ambulatory  Filed Vitals:  10/22/11 1357  BP: 138/79  Pulse: 67  Temp: 97.6 F (36.4 C)    GENERAL:alert, no distress, well nourished, well developed, comfortable, cooperative and smiling SKIN: skin color, texture, turgor are normal, no rashes or significant lesions HEAD: Normocephalic EYES: normal EARS: External ears normal OROPHARYNX:mucous membranes are moist  NECK: supple, no adenopathy, no bruits, thyroid normal size, non-tender,  without nodularity, no stridor, non-tender, trachea midline LYMPH:  no palpable lymphadenopathy, no hepatosplenomegaly BREAST:breasts appear normal, no suspicious masses, no skin or nipple changes or axillary nodes LUNGS: clear to auscultation and percussion HEART: regular rate & rhythm, no murmurs, no gallops, S1 normal and S2 normal ABDOMEN:abdomen soft, non-tender, normal bowel sounds and no hepatosplenomegaly BACK: Back symmetric, no curvature., No CVA tenderness EXTREMITIES:less then 2 second capillary refill, no joint deformities, effusion, or inflammation, no edema, no skin discoloration, no clubbing, no cyanosis  NEURO: alert & oriented x 3 with fluent speech, no focal motor/sensory deficits, gait normal   LABORATORY DATA: CBC    Component Value Date/Time   WBC 5.4 10/20/2011 1635   RBC 3.77* 10/20/2011 1635   HGB 10.3* 10/20/2011 1635   HCT 32.1* 10/20/2011 1635   PLT 193 10/20/2011 1635   MCV 85.1 10/20/2011 1635   MCH 27.3 10/20/2011 1635   MCHC 32.1 10/20/2011 1635   RDW 17.4* 10/20/2011 1635   LYMPHSABS 1.0 10/20/2011 1635   MONOABS 0.9 10/20/2011 1635   EOSABS 0.1 10/20/2011 1635   BASOSABS 0.1 10/20/2011 1635      Chemistry      Component Value Date/Time   NA 139 10/20/2011 1016   K 3.5 10/20/2011 1016   CL 105 10/20/2011 1016   CO2 25 10/20/2011 1016   BUN 13 10/20/2011 1016   CREATININE 0.76 10/20/2011 1016      Component Value Date/Time   CALCIUM 9.3 10/20/2011 1016   ALKPHOS 90 10/20/2011 1016   AST 16 10/20/2011 1016   ALT 18 10/20/2011 1016   BILITOT 0.2* 10/20/2011 1016        PATHOLOGY: 06/30/2011 Diagnosis 1. Breast, left, needle core biopsy, 2 o'clock, 8 cm from nipple - INVASIVE DUCTAL CARCINOMA. - SEE COMMENT. 2. Lymph node, biopsy, left axillary - POSITIVE FOR METASTATIC DUCTAL CARCINOMA. - SEE COMMENT. Microscopic Comment 1. Although definitive grading of breast carcinoma is best done on excision, the features of the tumor from  the 2 o'clock left needle core biopsy are compatible with a grade III breast carcinoma. Breast prognostic markers will be performed and reported in an addendum. The findings are called to the [facility] on 07/01/2011. Dr. Frederica Kuster has seen this in consultation with agreement. 2. The specimen submitted as left axillary lymph node shows high grade ductal carcinoma which is morphologically similar to the tumor seen from the 2 o'clock needle core biopsy from the left breast. The findings are called to the [facility] on 07/01/2011. Dr. Frederica Kuster has seen this case in consultation with agreement. (RH:kh 07-01-11) Zandra Abts MD Pathologist, Electronic Signature (Case signed 07/01/2011) 1. PROGNOSTIC INDICATORS - ACIS Results IMMUNOHISTOCHEMICAL AND MORPHOMETRIC ANALYSIS BY THE AUTOMATED CELLULAR IMAGING SYSTEM (ACIS) This invasive carcinoma shows the following breast prognostic profile. Estrogen Receptor (Negative, <1%): 0%, NEGATIVE Progesterone Receptor (Negative, <1%): 0%, NEGATIVE Proliferation Marker Ki67 by M IB-1 (Low<20%): 99% COMMENT: The negative hormone receptor study(ies) in this case have no internal positive control. All controls stained appropriately 1 of 3 FINAL for TAIZ, BICKLE ([id number]) (continued) Abigail Miyamoto MD Pathologist,  Electronic Signature ( Signed 07/07/2011) 1. CHROMOGENIC IN-SITU HYBRIDIZATION Interpretation HER-2/NEU BY CISH - NO AMPLIFICATION OF HER-2 DETECTED. THE RATIO OF HER-2: CEP 17 SIGNALS WAS 1.69. Reference range: Ratio: HER2:CEP17 < 1.8 - gene amplification not observed Ratio: HER2:CEP 17 1.8-2.2 - equivocal result Ratio: HER2:CEP17 > 2.2 - gene amplification observed H. CATHERINE LI MD Pathologist, Electronic Signature ( Signed 07/06/2011)    ASSESSMENT:  1. Stage III left-sided triple-negative breast cancer with a large 7 cm mass and clinically positive and radiographically positive lymph node in the  left axilla. S/P 4 cycles of dose-dense FEC and now 2 cycles of dose-dense taxotere.  She began chemotherapy on 08/03/2011. Physical exam today reveals that the once large breast mass is undetectable via physical exam. 2. Negative BRCA 1 and BRCA 2 genetic testing.     PLAN:  1. Pre-chemo lab work: CBC diff, CMET 2. Requested change in chemotherapy day to Nov 02, 2011.  Change noted in treatment plan. 3. Will request our scheduler to creat an appointment with Dr. Jamey Ripa the second week in January.  Dr. Mariel Sleet placed a call to Dr. Jamey Ripa but he is unavailable today. 4. Patient will return in one month for follow-up.  We will verify that the patient has an appointment for lumpectomy.  On follow-up visit, we will begin coordination of radiation therapy.   All questions were answered. The patient knows to call the clinic with any problems, questions or concerns. We can certainly see the patient much sooner if necessary.  The patient and plan discussed with Glenford Peers, MD and he is in agreement with the aforementioned.  Patient seen and examined by Dr. Glenford Peers.   I spent 25 minutes counseling the patient face to face. The total time spent in the appointment was 35 minutes.  Ailea Rhatigan

## 2011-10-22 NOTE — Patient Instructions (Signed)
[facility]  Discharge Instructions  RECOMMENDATIONS MADE BY THE CONSULTANT AND ANY TEST RESULTS WILL BE SENT TO YOUR REFERRING DOCTOR.   EXAM FINDINGS BY MD TODAY AND SIGNS AND SYMPTOMS TO REPORT TO CLINIC OR PRIMARY MD:   Dexamethasone 4mg . Take 2 tablets twice a day the day before chemo, then starting the day after chemo take 2 tablets twice a day x 3 days.  Prechemo labs: January 4th  Chemo: January 9th  Refer to schedule for further appt dates/times  Please contact us by Wednesday afternoon January 2nd if you have not heard from [place] or Dr. Jamey Ripa regarding your follow up appt   I acknowledge that I have been informed and understand all the instructions given to me and received a copy. I do not have any more questions at this time, but understand that I may call the Specialty Clinic at [facility] at [phone] during business hours should I have any further questions or need assistance in obtaining follow-up care.    __________________________________________  _____________  __________ Signature of Patient or Authorized Representative            Date                   Time    __________________________________________ Nurse's Signature

## 2011-10-25 ENCOUNTER — Telehealth (HOSPITAL_COMMUNITY): Payer: Self-pay | Admitting: *Deleted

## 2011-10-25 NOTE — Telephone Encounter (Signed)
Tolerated chemo well..Doing better this time.

## 2011-10-26 DIAGNOSIS — Z923 Personal history of irradiation: Secondary | ICD-10-CM

## 2011-10-26 HISTORY — DX: Personal history of irradiation: Z92.3

## 2011-10-27 ENCOUNTER — Ambulatory Visit (HOSPITAL_COMMUNITY): Payer: 59 | Admitting: Oncology

## 2011-10-29 ENCOUNTER — Encounter (HOSPITAL_COMMUNITY): Payer: 59 | Attending: Oncology

## 2011-10-29 DIAGNOSIS — C50912 Malignant neoplasm of unspecified site of left female breast: Secondary | ICD-10-CM

## 2011-10-29 DIAGNOSIS — C50419 Malignant neoplasm of upper-outer quadrant of unspecified female breast: Secondary | ICD-10-CM

## 2011-10-29 DIAGNOSIS — C50919 Malignant neoplasm of unspecified site of unspecified female breast: Secondary | ICD-10-CM | POA: Insufficient documentation

## 2011-10-29 LAB — BASIC METABOLIC PANEL
Chloride: 105 mEq/L (ref 96–112)
Creatinine, Ser: 0.9 mg/dL (ref 0.50–1.10)
GFR calc Af Amer: >90 mL/min (ref >90)
Potassium: 3.5 mEq/L (ref 3.5–5.1)

## 2011-10-29 LAB — DIFFERENTIAL
Basophils Absolute: 0.3 10*3/uL — ABNORMAL HIGH (ref 0.0–0.1)
Lymphocytes Relative: 5 % — ABNORMAL LOW (ref 12–46)
Monocytes Absolute: 2.8 10*3/uL — ABNORMAL HIGH (ref 0.1–1.0)
Neutro Abs: 26.1 10*3/uL — ABNORMAL HIGH (ref 1.7–7.7)

## 2011-10-29 LAB — CBC
HCT: 31.4 % — ABNORMAL LOW (ref 36.0–46.0)
RDW: 17.1 % — ABNORMAL HIGH (ref 11.5–15.5)
WBC: 30.9 10*3/uL — ABNORMAL HIGH (ref 4.0–10.5)

## 2011-10-29 NOTE — Progress Notes (Signed)
Labs drawn today for cbc/diff,bmp 

## 2011-11-03 ENCOUNTER — Encounter (HOSPITAL_BASED_OUTPATIENT_CLINIC_OR_DEPARTMENT_OTHER): Payer: 59

## 2011-11-03 ENCOUNTER — Telehealth (HOSPITAL_COMMUNITY): Payer: Self-pay | Admitting: *Deleted

## 2011-11-03 ENCOUNTER — Ambulatory Visit (HOSPITAL_COMMUNITY): Payer: 59

## 2011-11-03 VITALS — BP 149/82 | HR 110 | Temp 97.4°F | Ht 65.0 in | Wt 203.0 lb

## 2011-11-03 DIAGNOSIS — C773 Secondary and unspecified malignant neoplasm of axilla and upper limb lymph nodes: Secondary | ICD-10-CM

## 2011-11-03 DIAGNOSIS — Z5111 Encounter for antineoplastic chemotherapy: Secondary | ICD-10-CM

## 2011-11-03 DIAGNOSIS — C50419 Malignant neoplasm of upper-outer quadrant of unspecified female breast: Secondary | ICD-10-CM

## 2011-11-03 DIAGNOSIS — C50912 Malignant neoplasm of unspecified site of left female breast: Secondary | ICD-10-CM

## 2011-11-03 MED ORDER — SODIUM CHLORIDE 0.9 % IV SOLN: 8.0000 mg | Freq: Once | INTRAVENOUS | Status: DC

## 2011-11-03 MED ORDER — DOCETAXEL CHEMO INJECTION 160 MG/16ML
75.0000 mg/m2 | Freq: Once | INTRAVENOUS | Status: AC
  Administered 2011-11-03: 150 mg via INTRAVENOUS
  Filled 2011-11-03: qty 15

## 2011-11-03 MED ORDER — SODIUM CHLORIDE 0.9 % IJ SOLN
10.0000 mL | INTRAMUSCULAR | Status: DC | PRN
  Administered 2011-11-03: 10 mL
  Filled 2011-11-03: qty 10

## 2011-11-03 MED ORDER — HEPARIN SOD (PORK) LOCK FLUSH 100 UNIT/ML IV SOLN
INTRAVENOUS | Status: AC
  Administered 2011-11-03: 500 [IU]
  Filled 2011-11-03: qty 5

## 2011-11-03 MED ORDER — DEXAMETHASONE SODIUM PHOSPHATE 10 MG/ML IJ SOLN: 10.0000 mg | Freq: Once | INTRAMUSCULAR | Status: DC

## 2011-11-03 MED ORDER — HEPARIN SOD (PORK) LOCK FLUSH 100 UNIT/ML IV SOLN
500.0000 [IU] | Freq: Once | INTRAVENOUS | Status: AC | PRN
  Administered 2011-11-03: 500 [IU]
  Filled 2011-11-03: qty 5

## 2011-11-03 MED ORDER — ONDANSETRON HCL 4 MG/2ML IJ SOLN
Freq: Once | INTRAMUSCULAR | Status: AC
  Administered 2011-11-03: 8 mg via INTRAVENOUS
  Filled 2011-11-03: qty 4

## 2011-11-03 MED ORDER — SODIUM CHLORIDE 0.9 % IV SOLN
Freq: Once | INTRAVENOUS | Status: AC
  Administered 2011-11-03: 11:00:00 via INTRAVENOUS

## 2011-11-03 NOTE — Telephone Encounter (Signed)
Pt reports that she fell down her basement steps. Feet slipped out from under her and fell on bottom and rt shoulder area and slid down steps. Here today for treatment no other c/o except some tenderness in back.

## 2011-11-03 NOTE — Progress Notes (Signed)
Tolerated chemo well. 

## 2011-11-03 NOTE — Patient Instructions (Signed)
[facility] Discharge Instructions for Patients Receiving Chemotherapy  Today you received the following chemotherapy agents Taxotere.  To help prevent nausea and vomiting after your treatment, we encourage you to take your nausea medication.  Decadron  2 tabs tonight then 2 twice a day tomorrow. With next treatment take 2 tabs twice a day the day before, day of and day after.   If you develop nausea and vomiting that is not controlled by your nausea medication, call the clinic. If it is after clinic hours your family physician or the after hours number for the clinic or go to the Emergency Department.   BELOW ARE SYMPTOMS THAT SHOULD BE REPORTED IMMEDIATELY:  *FEVER GREATER THAN 101.0 F  *CHILLS WITH OR WITHOUT FEVER  NAUSEA AND VOMITING THAT IS NOT CONTROLLED WITH YOUR NAUSEA MEDICATION  *UNUSUAL SHORTNESS OF BREATH  *UNUSUAL BRUISING OR BLEEDING  TENDERNESS IN MOUTH AND THROAT WITH OR WITHOUT PRESENCE OF ULCERS  *URINARY PROBLEMS  *BOWEL PROBLEMS  UNUSUAL RASH Items with * indicate a potential emergency and should be followed up as soon as possible.  One of the nurses will contact you 24 hours after your treatment. Please let the nurse know about any problems that you may have experienced. Feel free to call the clinic you have any questions or concerns. The clinic phone number is [phone].   I have been informed and understand all the instructions given to me. I know to contact the clinic, my physician, or go to the Emergency Department if any problems should occur. I do not have any questions at this time, but understand that I may call the clinic during office hours or the Patient Navigator at [phone] should I have any questions or need assistance in obtaining follow up care.    __________________________________________  _____________  __________ Signature of Patient or Authorized Representative            Date                    Time    __________________________________________ Nurse's Signature

## 2011-11-04 ENCOUNTER — Encounter (HOSPITAL_BASED_OUTPATIENT_CLINIC_OR_DEPARTMENT_OTHER): Payer: 59

## 2011-11-04 ENCOUNTER — Inpatient Hospital Stay (HOSPITAL_COMMUNITY): Payer: 59

## 2011-11-04 DIAGNOSIS — C773 Secondary and unspecified malignant neoplasm of axilla and upper limb lymph nodes: Secondary | ICD-10-CM

## 2011-11-04 DIAGNOSIS — C50419 Malignant neoplasm of upper-outer quadrant of unspecified female breast: Secondary | ICD-10-CM

## 2011-11-04 DIAGNOSIS — Z5189 Encounter for other specified aftercare: Secondary | ICD-10-CM

## 2011-11-04 MED ORDER — PEGFILGRASTIM INJECTION 6 MG/0.6ML
SUBCUTANEOUS | Status: AC
  Administered 2011-11-04: 6 mg
  Filled 2011-11-04: qty 0.6

## 2011-11-04 NOTE — Progress Notes (Signed)
Jessica Simpson presents today for injection per MD orders. Neulasta 6mg administered SQ in right Abdomen. Administration without incident. Patient tolerated well.  

## 2011-11-08 ENCOUNTER — Telehealth (HOSPITAL_COMMUNITY): Payer: Self-pay

## 2011-11-08 NOTE — Telephone Encounter (Signed)
msg left to call clinic with any problems, concerns, or questions.

## 2011-11-09 ENCOUNTER — Encounter (INDEPENDENT_AMBULATORY_CARE_PROVIDER_SITE_OTHER): Payer: 59 | Admitting: Surgery

## 2011-11-12 ENCOUNTER — Other Ambulatory Visit (HOSPITAL_COMMUNITY): Payer: 59

## 2011-11-15 ENCOUNTER — Other Ambulatory Visit (HOSPITAL_COMMUNITY): Payer: 59

## 2011-11-16 ENCOUNTER — Inpatient Hospital Stay (HOSPITAL_COMMUNITY): Payer: 59

## 2011-11-17 ENCOUNTER — Ambulatory Visit (HOSPITAL_COMMUNITY): Payer: 59

## 2011-11-19 ENCOUNTER — Encounter (INDEPENDENT_AMBULATORY_CARE_PROVIDER_SITE_OTHER): Payer: 59 | Admitting: Surgery

## 2011-11-19 ENCOUNTER — Encounter (INDEPENDENT_AMBULATORY_CARE_PROVIDER_SITE_OTHER): Payer: Self-pay | Admitting: Surgery

## 2011-11-22 ENCOUNTER — Ambulatory Visit (HOSPITAL_COMMUNITY): Payer: 59 | Admitting: Oncology

## 2011-11-22 ENCOUNTER — Encounter (HOSPITAL_BASED_OUTPATIENT_CLINIC_OR_DEPARTMENT_OTHER): Payer: 59

## 2011-11-22 DIAGNOSIS — C50419 Malignant neoplasm of upper-outer quadrant of unspecified female breast: Secondary | ICD-10-CM

## 2011-11-22 DIAGNOSIS — C50912 Malignant neoplasm of unspecified site of left female breast: Secondary | ICD-10-CM

## 2011-11-22 DIAGNOSIS — C773 Secondary and unspecified malignant neoplasm of axilla and upper limb lymph nodes: Secondary | ICD-10-CM

## 2011-11-22 LAB — CBC
Hemoglobin: 9.8 g/dL — ABNORMAL LOW (ref 12.0–15.0)
MCH: 27.5 pg (ref 26.0–34.0)
Platelets: 318 10*3/uL (ref 150–400)
RBC: 3.56 MIL/uL — ABNORMAL LOW (ref 3.87–5.11)
WBC: 4.3 10*3/uL (ref 4.0–10.5)

## 2011-11-22 LAB — DIFFERENTIAL
Basophils Relative: 1 % (ref 0–1)
Eosinophils Relative: 1 % (ref 0–5)
Monocytes Absolute: 0.8 10*3/uL (ref 0.1–1.0)
Monocytes Relative: 17 % — ABNORMAL HIGH (ref 3–12)
Neutro Abs: 2.4 10*3/uL (ref 1.7–7.7)

## 2011-11-22 LAB — COMPREHENSIVE METABOLIC PANEL
ALT: 19 U/L (ref 0–35)
AST: 19 U/L (ref 0–37)
Albumin: 3.3 g/dL — ABNORMAL LOW (ref 3.5–5.2)
Alkaline Phosphatase: 74 U/L (ref 39–117)
CO2: 27 mEq/L (ref 19–32)
Chloride: 105 mEq/L (ref 96–112)
GFR calc non Af Amer: >90 mL/min (ref >90)
Potassium: 3.9 mEq/L (ref 3.5–5.1)
Sodium: 140 mEq/L (ref 135–145)
Total Bilirubin: 0.3 mg/dL (ref 0.3–1.2)

## 2011-11-22 NOTE — Progress Notes (Signed)
Labs drawn today for cbc/diff,cmp 

## 2011-11-23 ENCOUNTER — Other Ambulatory Visit (HOSPITAL_COMMUNITY): Payer: Self-pay | Admitting: Oncology

## 2011-11-23 ENCOUNTER — Encounter (HOSPITAL_BASED_OUTPATIENT_CLINIC_OR_DEPARTMENT_OTHER): Payer: 59

## 2011-11-23 VITALS — BP 166/94 | HR 92 | Temp 97.4°F | Ht 65.0 in | Wt 205.6 lb

## 2011-11-23 DIAGNOSIS — Z5111 Encounter for antineoplastic chemotherapy: Secondary | ICD-10-CM

## 2011-11-23 DIAGNOSIS — C50912 Malignant neoplasm of unspecified site of left female breast: Secondary | ICD-10-CM

## 2011-11-23 DIAGNOSIS — C773 Secondary and unspecified malignant neoplasm of axilla and upper limb lymph nodes: Secondary | ICD-10-CM

## 2011-11-23 DIAGNOSIS — C50419 Malignant neoplasm of upper-outer quadrant of unspecified female breast: Secondary | ICD-10-CM

## 2011-11-23 MED ORDER — SODIUM CHLORIDE 0.9 % IV SOLN
Freq: Once | INTRAVENOUS | Status: AC
  Administered 2011-11-23: 10:00:00 via INTRAVENOUS

## 2011-11-23 MED ORDER — DOCETAXEL CHEMO INJECTION 160 MG/16ML
75.0000 mg/m2 | Freq: Once | INTRAVENOUS | Status: AC
  Administered 2011-11-23: 150 mg via INTRAVENOUS
  Filled 2011-11-23: qty 15

## 2011-11-23 MED ORDER — DEXAMETHASONE SODIUM PHOSPHATE 10 MG/ML IJ SOLN: 10.0000 mg | Freq: Once | INTRAMUSCULAR | Status: DC

## 2011-11-23 MED ORDER — SODIUM CHLORIDE 0.9 % IV SOLN: 8.0000 mg | Freq: Once | INTRAVENOUS | Status: DC

## 2011-11-23 MED ORDER — HEPARIN SOD (PORK) LOCK FLUSH 100 UNIT/ML IV SOLN
500.0000 [IU] | Freq: Once | INTRAVENOUS | Status: AC | PRN
  Administered 2011-11-23: 500 [IU]
  Filled 2011-11-23: qty 5

## 2011-11-23 MED ORDER — SODIUM CHLORIDE 0.9 % IJ SOLN
10.0000 mL | INTRAMUSCULAR | Status: DC | PRN
  Administered 2011-11-23: 10 mL
  Filled 2011-11-23: qty 10

## 2011-11-23 MED ORDER — SODIUM CHLORIDE 0.9 % IV SOLN
Freq: Once | INTRAVENOUS | Status: AC
  Administered 2011-11-23: 8 mg via INTRAVENOUS
  Filled 2011-11-23: qty 4

## 2011-11-23 NOTE — Progress Notes (Signed)
Tolerated well

## 2011-11-24 ENCOUNTER — Encounter (HOSPITAL_BASED_OUTPATIENT_CLINIC_OR_DEPARTMENT_OTHER): Payer: 59

## 2011-11-24 VITALS — BP 146/86 | HR 101 | Temp 97.7°F

## 2011-11-24 DIAGNOSIS — C50912 Malignant neoplasm of unspecified site of left female breast: Secondary | ICD-10-CM

## 2011-11-24 DIAGNOSIS — C773 Secondary and unspecified malignant neoplasm of axilla and upper limb lymph nodes: Secondary | ICD-10-CM

## 2011-11-24 DIAGNOSIS — Z5189 Encounter for other specified aftercare: Secondary | ICD-10-CM

## 2011-11-24 DIAGNOSIS — C50419 Malignant neoplasm of upper-outer quadrant of unspecified female breast: Secondary | ICD-10-CM

## 2011-11-24 MED ORDER — PEGFILGRASTIM INJECTION 6 MG/0.6ML
6.0000 mg | Freq: Once | SUBCUTANEOUS | Status: AC
  Administered 2011-11-24: 6 mg via SUBCUTANEOUS

## 2011-11-24 MED ORDER — PEGFILGRASTIM INJECTION 6 MG/0.6ML
SUBCUTANEOUS | Status: AC
  Filled 2011-11-24: qty 0.6

## 2011-11-24 NOTE — Progress Notes (Signed)
Rosalena D Moodie presents today for injection per MD orders. Neulasta 6mg administered SQ in left Abdomen. Administration without incident. Patient tolerated well.  

## 2011-11-30 ENCOUNTER — Ambulatory Visit (HOSPITAL_COMMUNITY): Payer: 59 | Admitting: Oncology

## 2011-12-03 ENCOUNTER — Other Ambulatory Visit (INDEPENDENT_AMBULATORY_CARE_PROVIDER_SITE_OTHER): Payer: Self-pay | Admitting: Surgery

## 2011-12-03 ENCOUNTER — Encounter (INDEPENDENT_AMBULATORY_CARE_PROVIDER_SITE_OTHER): Payer: Self-pay | Admitting: Surgery

## 2011-12-03 ENCOUNTER — Ambulatory Visit (INDEPENDENT_AMBULATORY_CARE_PROVIDER_SITE_OTHER): Payer: 59 | Admitting: Surgery

## 2011-12-03 ENCOUNTER — Encounter (INDEPENDENT_AMBULATORY_CARE_PROVIDER_SITE_OTHER): Payer: 59 | Admitting: Surgery

## 2011-12-03 VITALS — BP 142/86 | HR 96 | Temp 98.4°F | Resp 18 | Ht 65.0 in | Wt 208.2 lb

## 2011-12-03 DIAGNOSIS — C50912 Malignant neoplasm of unspecified site of left female breast: Secondary | ICD-10-CM

## 2011-12-03 DIAGNOSIS — C50919 Malignant neoplasm of unspecified site of unspecified female breast: Secondary | ICD-10-CM

## 2011-12-03 NOTE — Progress Notes (Signed)
  CC: Breast cancer evaluation after neoadjuvant chemotherapy HPI: I saw this patient initially several months ago (September, 2012) with a 7 cm breast cancer in the upper outer quadrant of the left breast and known axillary metastases. We placed a Port-A-Cath for chemotherapy and she has completed her chemotherapy the last week. She presents today for evaluation and discussion of surgical options. She notes that her cancer has gotten much smaller and that she has difficulty feeling it. She also wishes to have her Port-A-Cath removed.   ROS: See note in Epic  MEDS:  No current outpatient prescriptions on file.      ALLERGIES:  Allergies  Allergen Reactions  . Codeine     Nausea, vomiting, pain   . Lortab      PE GENERAL:  The patient is alert, oriented, and generally healthy-appearing, NAD. Mood and affect are normal.  HEENT:  The head is normocephalic, the eyes nonicteric, the pupils were round regular and equal. EOMs are normal. Pharynx normal. Dentition good.  NECK:  The neck is supple and there are no masses or thyromegaly.  LUNGS: Normal respirations and clear to auscultation.  HEART: Regular rhythm, with no murmurs rubs or gallops. Pulses are intact carotid dorsalis pedis and posterior tibial. No significant varicosities are noted.  BREASTS: The left breast has a subtle mass in the 1;30 position about three fingerbreadths from the areolar margin.   LYMPHATICS: I do not feel any axillary nodes today  ABDOMEN: Soft, flat, and nontender. No masses or organomegaly is noted. No hernias are noted. Bowel sounds are normal.  EXTREMITIES:  Good range of motion, no edema.   Data Reviewed I have looked over our old nodes plus the notes from the oncologist  Assessment Significant clinical response to neoadjuvant chemotherapy for a left breast cancer. By physical examination this should be amenable to a lumpectomy with node dissection.  Plan We will tentatively  schedule her for a needle localized lumpectomy and axillary node dissection for her left breast cancer. We will also plan to remove her Port-A-Cath at her request. I discussed this with her and I think she understands. She knows that she will need to stay in the hospital overnight. She understands that she will have a drain tube. We discussed the fact that we sometimes have to go back for a second procedure to clear the margins if we did not get negative margins at the initial surgery. 

## 2011-12-03 NOTE — Patient Instructions (Signed)
We will schedule you to have an MRI and then to have a left lumpectomy with removal of the lymph nodes from the left armpit area. We will also remove your port.

## 2011-12-06 ENCOUNTER — Encounter (HOSPITAL_COMMUNITY): Payer: Self-pay | Admitting: Oncology

## 2011-12-06 ENCOUNTER — Encounter (HOSPITAL_COMMUNITY): Payer: 59 | Attending: Oncology | Admitting: Oncology

## 2011-12-06 DIAGNOSIS — Z9889 Other specified postprocedural states: Secondary | ICD-10-CM

## 2011-12-06 DIAGNOSIS — C50912 Malignant neoplasm of unspecified site of left female breast: Secondary | ICD-10-CM

## 2011-12-06 DIAGNOSIS — C773 Secondary and unspecified malignant neoplasm of axilla and upper limb lymph nodes: Secondary | ICD-10-CM

## 2011-12-06 DIAGNOSIS — C50419 Malignant neoplasm of upper-outer quadrant of unspecified female breast: Secondary | ICD-10-CM

## 2011-12-06 DIAGNOSIS — N63 Unspecified lump in unspecified breast: Secondary | ICD-10-CM

## 2011-12-06 DIAGNOSIS — E049 Nontoxic goiter, unspecified: Secondary | ICD-10-CM | POA: Insufficient documentation

## 2011-12-06 LAB — TSH: TSH: 1.401 u[IU]/mL (ref 0.350–4.500)

## 2011-12-06 NOTE — Progress Notes (Signed)
This office note has been dictated.

## 2011-12-06 NOTE — Progress Notes (Signed)
CC:   Currie Paris, M.D. Maximiano Coss Grayland Jack, M.D. Willeen Niece, MD  DIAGNOSES: 1. Stage III, left-sided, triple negative breast cancer with a 7 to     7.6 cm mass, upper-outer quadrant in left breast, positive lymph     node in the left axilla radiographically, status post now     chemotherapy with FEC followed by Taxotere, both in a dose-dense     fashion.  Her Taxotere, last dose was on 11/23/2011, and cycle 4 of     FEC was on 09/14/2011.  BRCA-1 and BRCA-2 negative. 2. 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     She was followed most recently by Dr. Luciano Cutter at [facility] for     this. 3. Excessive tearing with her chemotherapy which is much improved. 4. Obesity.  Jessica Simpson is here today.  She has pigmentation underneath both eyes from just wiping her eyes.  The excessive tearing is diminished to gone.  She is aware of a little bit of blood on the toilet paper periodically, but denies constipation.  No hemorrhoids that she is aware of.  But, was worried about this issue with the perirectal area, namely perirectal abscess coming back.  She looks really good today.  I think she is due for an MRI on Monday. She is due for surgery on the 26th of this month.  To me, the upper-outer quadrant mass is much more difficult to feel.  It is about 3 x 3 cm at most and it may be very similar to the palpable tissue in the upper-outer quadrant of the right breast. The right breast tissue is consistent with fibroglandular tissue so it is not as clearly defined an issue whatsoever anymore.  She looks really quite good today.  Otherwise, no adenopathy in the cervical, supraclavicular, infraclavicular, axillary, or inguinal areas. Her abdomen is soft and nontender.  There is a sense that she might have a little thyroid enlargement.  We will get an echo on this area and  a TSH.  She has a normal heart exam with no murmur, rub, or gallop.  Lungs are clear.  There is no arm or leg edema.  Bowel sounds are diminished.  I think whatever is in the upper-outer quadrant may be tumor, may be fibroglandular tissue.  We will see if the definitive surgical procedure shows [place].  It is of note that she is BRCA-1 and BRCA-2 negative.  I will see her about 10 days or so after her surgery.  If she gets fever or perirectal pain, etc., then she needs to contact us.  But hopefully, this little bit of bleeding may be just from being a little irritated or a little constipated, etc.  We will see what happens with that.    ______________________________ Ladona Horns. Mariel Sleet, MD ESN/MEDQ  D:  12/06/2011  T:  12/06/2011  Job:  [id number]

## 2011-12-06 NOTE — Patient Instructions (Addendum)
Jessica Simpson  [id number] [birth year]  [facility]  Discharge Instructions  RECOMMENDATIONS MADE BY THE CONSULTANT AND ANY TEST RESULTS WILL BE SENT TO YOUR REFERRING DOCTOR.   EXAM FINDINGS BY MD TODAY AND SIGNS AND SYMPTOMS TO REPORT TO CLINIC OR PRIMARY MD: You are doing well. MD will check thyroid to see if the gland is enlarged or if you have a goiter.  Report any new lumps, bone pain or shortness of breath.  MEDICATIONS PRESCRIBED: none    SPECIAL INSTRUCTIONS/FOLLOW-UP:  Ultrasound of thyroid and to see Dr. Mariel Sleet 2 weeks after surgery.  I acknowledge that I have been informed and understand all the instructions given to me and received a copy. I do not have any more questions at this time, but understand that I may call the Specialty Clinic at [facility] at [phone] during business hours should I have any further questions or need assistance in obtaining follow-up care.    __________________________________________  _____________  __________ Signature of Patient or Authorized Representative            Date                   Time    __________________________________________ Nurse's Signature

## 2011-12-08 ENCOUNTER — Ambulatory Visit (HOSPITAL_COMMUNITY)
Admission: RE | Admit: 2011-12-08 | Discharge: 2011-12-08 | Disposition: A | Discharge status: Home / Self Care | Payer: 59 | Source: Ambulatory Visit | Attending: Oncology | Admitting: Oncology

## 2011-12-08 DIAGNOSIS — E041 Nontoxic single thyroid nodule: Secondary | ICD-10-CM | POA: Insufficient documentation

## 2011-12-08 DIAGNOSIS — R1310 Dysphagia, unspecified: Secondary | ICD-10-CM | POA: Insufficient documentation

## 2011-12-08 DIAGNOSIS — E049 Nontoxic goiter, unspecified: Secondary | ICD-10-CM | POA: Insufficient documentation

## 2011-12-09 ENCOUNTER — Telehealth (INDEPENDENT_AMBULATORY_CARE_PROVIDER_SITE_OTHER): Payer: Self-pay | Admitting: General Surgery

## 2011-12-09 DIAGNOSIS — E041 Nontoxic single thyroid nodule: Secondary | ICD-10-CM

## 2011-12-09 NOTE — Telephone Encounter (Signed)
Message copied by Liliana Cline on Thu Dec 09, 2011  9:48 AM ------      Message from: Currie Paris      Created: Wed Dec 08, 2011  3:50 PM       Lesly Rubenstein, She is scheduled for surgery but, in the meantime, a thyroid nodule was found and she needs to be scheduled for an ultrasound FNA biospy. Dr Mariel Sleet is going to let her know but we need to set it up so I can follow up. It does not need to be done before her surgery (the 26th I think) because we will not do anything at the same time. But if she wants to get it done first that will be fine also

## 2011-12-13 ENCOUNTER — Ambulatory Visit
Admission: RE | Admit: 2011-12-13 | Discharge: 2011-12-13 | Disposition: A | Discharge status: Home / Self Care | Payer: 59 | Source: Ambulatory Visit | Attending: Surgery | Admitting: Surgery

## 2011-12-13 MED ORDER — GADOBENATE DIMEGLUMINE 529 MG/ML IV SOLN: 19.0000 mL | Freq: Once | INTRAVENOUS | Status: AC | PRN

## 2011-12-14 ENCOUNTER — Encounter (HOSPITAL_BASED_OUTPATIENT_CLINIC_OR_DEPARTMENT_OTHER): Payer: Self-pay | Admitting: *Deleted

## 2011-12-20 ENCOUNTER — Ambulatory Visit (HOSPITAL_COMMUNITY): Payer: 59

## 2011-12-21 ENCOUNTER — Observation Stay (HOSPITAL_COMMUNITY): Payer: 59 | Admitting: Anesthesiology

## 2011-12-21 ENCOUNTER — Encounter (HOSPITAL_COMMUNITY)
Admission: RE | Disposition: A | Discharge status: Home / Self Care | Payer: Self-pay | Source: Ambulatory Visit | Attending: Surgery

## 2011-12-21 ENCOUNTER — Ambulatory Visit (HOSPITAL_BASED_OUTPATIENT_CLINIC_OR_DEPARTMENT_OTHER): Admit: 2011-12-21 | Payer: Self-pay | Admitting: General Surgery

## 2011-12-21 ENCOUNTER — Ambulatory Visit
Admission: RE | Admit: 2011-12-21 | Discharge: 2011-12-21 | Disposition: A | Discharge status: Home / Self Care | Payer: 59 | Source: Ambulatory Visit | Attending: Surgery | Admitting: Surgery

## 2011-12-21 ENCOUNTER — Encounter (HOSPITAL_BASED_OUTPATIENT_CLINIC_OR_DEPARTMENT_OTHER): Payer: Self-pay | Admitting: Anesthesiology

## 2011-12-21 ENCOUNTER — Encounter (HOSPITAL_COMMUNITY): Payer: Self-pay | Admitting: *Deleted

## 2011-12-21 ENCOUNTER — Encounter (HOSPITAL_COMMUNITY): Payer: Self-pay | Admitting: Anesthesiology

## 2011-12-21 ENCOUNTER — Ambulatory Visit (HOSPITAL_BASED_OUTPATIENT_CLINIC_OR_DEPARTMENT_OTHER): Payer: 59 | Admitting: Anesthesiology

## 2011-12-21 ENCOUNTER — Encounter (HOSPITAL_BASED_OUTPATIENT_CLINIC_OR_DEPARTMENT_OTHER): Payer: Self-pay | Admitting: *Deleted

## 2011-12-21 ENCOUNTER — Inpatient Hospital Stay (HOSPITAL_BASED_OUTPATIENT_CLINIC_OR_DEPARTMENT_OTHER)
Admission: RE | Admit: 2011-12-21 | Discharge: 2011-12-22 | DRG: 920 | Disposition: A | Discharge status: Home / Self Care | Payer: 59 | Source: Ambulatory Visit | Attending: Surgery | Admitting: Surgery

## 2011-12-21 DIAGNOSIS — C50912 Malignant neoplasm of unspecified site of left female breast: Secondary | ICD-10-CM

## 2011-12-21 DIAGNOSIS — IMO0002 Reserved for concepts with insufficient information to code with codable children: Principal | ICD-10-CM | POA: Diagnosis present

## 2011-12-21 DIAGNOSIS — Y836 Removal of other organ (partial) (total) as the cause of abnormal reaction of the patient, or of later complication, without mention of misadventure at the time of the procedure: Secondary | ICD-10-CM | POA: Diagnosis present

## 2011-12-21 DIAGNOSIS — C773 Secondary and unspecified malignant neoplasm of axilla and upper limb lymph nodes: Secondary | ICD-10-CM | POA: Diagnosis present

## 2011-12-21 DIAGNOSIS — Z452 Encounter for adjustment and management of vascular access device: Secondary | ICD-10-CM

## 2011-12-21 DIAGNOSIS — Z853 Personal history of malignant neoplasm of breast: Secondary | ICD-10-CM

## 2011-12-21 DIAGNOSIS — C50919 Malignant neoplasm of unspecified site of unspecified female breast: Secondary | ICD-10-CM

## 2011-12-21 HISTORY — PX: EVACUATION BREAST HEMATOMA: SHX1537

## 2011-12-21 HISTORY — PX: PORT-A-CATH REMOVAL: SHX5289

## 2011-12-21 SURGERY — EVACUATION, HEMATOMA, BREAST
Anesthesia: General | Laterality: Left | Wound class: Clean

## 2011-12-21 SURGERY — EVACUATION, HEMATOMA, BREAST
Anesthesia: General | Site: Breast | Laterality: Left | Wound class: Clean

## 2011-12-21 SURGERY — INCISION AND DRAINAGE, ABSCESS
Anesthesia: General | Laterality: Left

## 2011-12-21 SURGERY — BREAST LUMPECTOMY WITH NEEDLE LOCALIZATION AND AXILLARY LYMPH NODE DISSECTION
Anesthesia: General | Site: Breast | Wound class: Clean

## 2011-12-21 MED ORDER — HYDROMORPHONE HCL 2 MG PO TABS
2.0000 mg | ORAL_TABLET | ORAL | Status: DC | PRN
Start: 2011-12-21 — End: 2011-12-22
  Administered 2011-12-21 – 2011-12-22 (×3): 2 mg via ORAL
  Filled 2011-12-21 (×2): qty 1

## 2011-12-21 MED ORDER — PROPOFOL 10 MG/ML IV EMUL
INTRAVENOUS | Status: DC | PRN
  Administered 2011-12-21: 200 mg via INTRAVENOUS

## 2011-12-21 MED ORDER — PROPOFOL 10 MG/ML IV EMUL
INTRAVENOUS | Status: DC | PRN
  Administered 2011-12-21: 200 mg via INTRAVENOUS
  Administered 2011-12-21: 25 mg via INTRAVENOUS

## 2011-12-21 MED ORDER — LIDOCAINE HCL (CARDIAC) 20 MG/ML IV SOLN
INTRAVENOUS | Status: DC | PRN
  Administered 2011-12-21: 30 mg via INTRAVENOUS

## 2011-12-21 MED ORDER — SUCCINYLCHOLINE CHLORIDE 20 MG/ML IJ SOLN
INTRAMUSCULAR | Status: DC | PRN
  Administered 2011-12-21: 100 mg via INTRAVENOUS

## 2011-12-21 MED ORDER — MORPHINE SULFATE 2 MG/ML IJ SOLN
2.0000 mg | INTRAMUSCULAR | Status: DC | PRN
  Administered 2011-12-21: 1 mg via INTRAVENOUS

## 2011-12-21 MED ORDER — ONDANSETRON HCL 4 MG/2ML IJ SOLN: 4.0000 mg | Freq: Four times a day (QID) | INTRAMUSCULAR | Status: DC | PRN

## 2011-12-21 MED ORDER — CHLORHEXIDINE GLUCONATE 4 % EX LIQD: 1.0000 "application " | Freq: Once | CUTANEOUS | Status: DC

## 2011-12-21 MED ORDER — ACETAMINOPHEN 325 MG PO TABS: 650.0000 mg | ORAL_TABLET | ORAL | Status: DC | PRN

## 2011-12-21 MED ORDER — PROMETHAZINE HCL 25 MG/ML IJ SOLN: 6.2500 mg | INTRAMUSCULAR | Status: DC | PRN

## 2011-12-21 MED ORDER — LACTATED RINGERS IV SOLN
INTRAVENOUS | Status: DC | PRN
  Administered 2011-12-21 – 2017-11-14 (×2): via INTRAVENOUS

## 2011-12-21 MED ORDER — 0.9 % SODIUM CHLORIDE (POUR BTL) OPTIME
TOPICAL | Status: DC | PRN
  Administered 2011-12-21: 1000 mL

## 2011-12-21 MED ORDER — PROMETHAZINE HCL 25 MG/ML IJ SOLN: 12.5000 mg | Freq: Four times a day (QID) | INTRAMUSCULAR | Status: DC | PRN

## 2011-12-21 MED ORDER — FENTANYL CITRATE 0.05 MG/ML IJ SOLN
INTRAMUSCULAR | Status: DC | PRN
  Administered 2011-12-21: 50 ug via INTRAVENOUS
  Administered 2011-12-21: 100 ug via INTRAVENOUS

## 2011-12-21 MED ORDER — CEFAZOLIN SODIUM 1-5 GM-% IV SOLN
INTRAVENOUS | Status: DC | PRN
  Administered 2011-12-21: 2 g via INTRAVENOUS

## 2011-12-21 MED ORDER — SODIUM CHLORIDE 0.9 % IV SOLN: 250.0000 mL | INTRAVENOUS | Status: DC | PRN

## 2011-12-21 MED ORDER — DEXAMETHASONE SODIUM PHOSPHATE 4 MG/ML IJ SOLN
INTRAMUSCULAR | Status: DC | PRN
  Administered 2011-12-21: 10 mg via INTRAVENOUS

## 2011-12-21 MED ORDER — SODIUM CHLORIDE 0.9 % IJ SOLN: 3.0000 mL | INTRAMUSCULAR | Status: DC | PRN

## 2011-12-21 MED ORDER — CEFAZOLIN SODIUM-DEXTROSE 2-3 GM-% IV SOLR
2.0000 g | INTRAVENOUS | Status: AC
  Administered 2011-12-21: 2 g via INTRAVENOUS

## 2011-12-21 MED ORDER — MIDAZOLAM HCL 5 MG/5ML IJ SOLN
INTRAMUSCULAR | Status: DC | PRN
  Administered 2011-12-21: 1 mg via INTRAVENOUS

## 2011-12-21 MED ORDER — ONDANSETRON HCL 4 MG/2ML IJ SOLN
INTRAMUSCULAR | Status: DC | PRN
  Administered 2011-12-21: 4 mg via INTRAVENOUS

## 2011-12-21 MED ORDER — LACTATED RINGERS IV SOLN
INTRAVENOUS | Status: DC
  Administered 2011-12-21 (×3): via INTRAVENOUS

## 2011-12-21 MED ORDER — ACETAMINOPHEN 650 MG RE SUPP: 650.0000 mg | RECTAL | Status: DC | PRN

## 2011-12-21 MED ORDER — SODIUM CHLORIDE 0.9 % IJ SOLN: 3.0000 mL | Freq: Two times a day (BID) | INTRAMUSCULAR | Status: DC

## 2011-12-21 MED ORDER — DEXTROSE IN LACTATED RINGERS 5 % IV SOLN
INTRAVENOUS | Status: DC
  Administered 2011-12-21: 16:00:00 via INTRAVENOUS

## 2011-12-21 MED ORDER — CHLORHEXIDINE GLUCONATE 4 % EX LIQD
1.0000 | Freq: Once | CUTANEOUS | Status: DC
Start: 2011-12-21 — End: 2011-12-21

## 2011-12-21 MED ORDER — LACTATED RINGERS IV SOLN
INTRAVENOUS | Status: DC | PRN
  Administered 2011-12-21: 23:00:00 via INTRAVENOUS

## 2011-12-21 MED ORDER — CEFAZOLIN SODIUM 1-5 GM-% IV SOLN
INTRAVENOUS | Status: AC
  Filled 2011-12-21: qty 100

## 2011-12-21 MED ORDER — BUPIVACAINE-EPINEPHRINE 0.5% -1:200000 IJ SOLN
INTRAMUSCULAR | Status: DC | PRN
  Administered 2011-12-21: 30 mL

## 2011-12-21 MED ORDER — MIDAZOLAM HCL 5 MG/5ML IJ SOLN
INTRAMUSCULAR | Status: DC | PRN
  Administered 2011-12-21: 2 mg via INTRAVENOUS

## 2011-12-21 MED ORDER — HYDROMORPHONE HCL PF 1 MG/ML IJ SOLN
0.2500 mg | INTRAMUSCULAR | Status: DC | PRN
Start: 2011-12-21 — End: 2011-12-21
  Administered 2011-12-21 (×2): 0.5 mg via INTRAVENOUS

## 2011-12-21 MED ORDER — FENTANYL CITRATE 0.05 MG/ML IJ SOLN
INTRAMUSCULAR | Status: DC | PRN
  Administered 2011-12-21: 25 ug via INTRAVENOUS
  Administered 2011-12-21: 50 ug via INTRAVENOUS
  Administered 2011-12-21 (×4): 25 ug via INTRAVENOUS
  Administered 2011-12-21: 50 ug via INTRAVENOUS

## 2011-12-21 SURGICAL SUPPLY — 46 items
APPLIER CLIP 9.375 MED OPEN (MISCELLANEOUS) ×3
BIOPATCH RED 1 DISK 7.0 (GAUZE/BANDAGES/DRESSINGS) ×3 IMPLANT
BLADE HEX COATED 2.75 (ELECTRODE) ×3 IMPLANT
BLADE SURG 15 STRL LF DISP TIS (BLADE) ×4 IMPLANT
BLADE SURG 15 STRL SS (BLADE) ×2
CHLORAPREP W/TINT 26ML (MISCELLANEOUS) ×3 IMPLANT
CLIP APPLIE 9.375 MED OPEN (MISCELLANEOUS) ×2 IMPLANT
CLIP TI WIDE RED SMALL 6 (CLIP) ×3 IMPLANT
CLOTH BEACON ORANGE TIMEOUT ST (SAFETY) ×3 IMPLANT
COVER MAYO STAND STRL (DRAPES) ×3 IMPLANT
COVER TABLE BACK 60X90 (DRAPES) ×3 IMPLANT
DECANTER SPIKE VIAL GLASS SM (MISCELLANEOUS) ×3 IMPLANT
DERMABOND ADVANCED (GAUZE/BANDAGES/DRESSINGS) ×2
DERMABOND ADVANCED .7 DNX12 (GAUZE/BANDAGES/DRESSINGS) ×4 IMPLANT
DRAIN CHANNEL 19F RND (DRAIN) ×3 IMPLANT
DRAPE LAPAROSCOPIC ABDOMINAL (DRAPES) ×3 IMPLANT
DRAPE PED LAPAROTOMY (DRAPES) ×3 IMPLANT
DRAPE UTILITY XL STRL (DRAPES) ×3 IMPLANT
DRSG TEGADERM 4X4.75 (GAUZE/BANDAGES/DRESSINGS) ×3 IMPLANT
ELECT BLADE 4.0 EZ CLEAN MEGAD (MISCELLANEOUS) ×3
ELECT REM PT RETURN 9FT ADLT (ELECTROSURGICAL) ×3
ELECTRODE BLDE 4.0 EZ CLN MEGD (MISCELLANEOUS) ×2 IMPLANT
ELECTRODE REM PT RTRN 9FT ADLT (ELECTROSURGICAL) ×2 IMPLANT
EVACUATOR 1/8 PVC DRAIN (DRAIN) ×3 IMPLANT
GAUZE SPONGE 4X4 12PLY STRL LF (GAUZE/BANDAGES/DRESSINGS) ×3 IMPLANT
GLOVE BIOGEL PI IND STRL 8 (GLOVE) ×2 IMPLANT
GLOVE BIOGEL PI INDICATOR 8 (GLOVE) ×1
GLOVE ECLIPSE 6.5 STRL STRAW (GLOVE) ×3 IMPLANT
GLOVE EUDERMIC 7 POWDERFREE (GLOVE) ×6 IMPLANT
GLOVE INDICATOR 8.0 STRL GRN (GLOVE) IMPLANT
GOWN PREVENTION PLUS XLARGE (GOWN DISPOSABLE) ×6 IMPLANT
NEEDLE HYPO 25X1 1.5 SAFETY (NEEDLE) ×3 IMPLANT
PACK BASIN DAY SURGERY FS (CUSTOM PROCEDURE TRAY) ×3 IMPLANT
PENCIL BUTTON HOLSTER BLD 10FT (ELECTRODE) ×3 IMPLANT
SHEET MEDIUM DRAPE 40X70 STRL (DRAPES) ×3 IMPLANT
SLEEVE SCD COMPRESS KNEE MED (MISCELLANEOUS) IMPLANT
SPONGE LAP 4X18 X RAY DECT (DISPOSABLE) ×6 IMPLANT
SUT MNCRL AB 4-0 PS2 18 (SUTURE) ×3 IMPLANT
SUT MON AB 4-0 PC3 18 (SUTURE) ×3 IMPLANT
SUT VICRYL 3-0 CR8 SH (SUTURE) ×6 IMPLANT
SYR CONTROL 10ML LL (SYRINGE) ×3 IMPLANT
TOWEL OR 17X24 6PK STRL BLUE (TOWEL DISPOSABLE) ×6 IMPLANT
TOWEL OR NON WOVEN STRL DISP B (DISPOSABLE) ×3 IMPLANT
TUBE CONNECTING 20X1/4 (TUBING) ×3 IMPLANT
WATER STERILE IRR 1000ML POUR (IV SOLUTION) ×3 IMPLANT
YANKAUER SUCT BULB TIP NO VENT (SUCTIONS) ×3 IMPLANT

## 2011-12-21 SURGICAL SUPPLY — 24 items
BENZOIN TINCTURE PRP APPL 2/3 (GAUZE/BANDAGES/DRESSINGS) ×2 IMPLANT
BLADE SURG 15 STRL LF DISP TIS (BLADE) ×1 IMPLANT
BLADE SURG 15 STRL SS (BLADE) ×1
BOVIE (MISCELLANEOUS) ×2 IMPLANT
CANISTER SUCTION 2500CC (MISCELLANEOUS) ×2 IMPLANT
CLOSURE STERI STRIP 1/2 X4 (GAUZE/BANDAGES/DRESSINGS) ×2 IMPLANT
DRAPE LAPAROTOMY T 102X78X121 (DRAPES) ×2 IMPLANT
ELECT CAUTERY BLADE 6.4 (BLADE) ×2 IMPLANT
ELECT REM PT RETURN 9FT ADLT (ELECTROSURGICAL) ×2
ELECTRODE REM PT RTRN 9FT ADLT (ELECTROSURGICAL) ×1 IMPLANT
GLOVE BIO SURGEON STRL SZ 6 (GLOVE) ×2 IMPLANT
GLOVE BIO SURGEON STRL SZ 6.5 (GLOVE) ×2 IMPLANT
GLOVE BIOGEL PI IND STRL 6.5 (GLOVE) ×2 IMPLANT
GLOVE BIOGEL PI INDICATOR 6.5 (GLOVE) ×2
GOWN BRE IMP SLV AUR LG STRL (GOWN DISPOSABLE) ×2 IMPLANT
GOWN EXTRA PROTECTION XXL 0583 (GOWNS) ×2 IMPLANT
KIT BASIN OR (CUSTOM PROCEDURE TRAY) ×2 IMPLANT
NEEDLE HYPO 25GX1X1/2 BEV (NEEDLE) ×2 IMPLANT
SPONGE LAP 18X18 X RAY DECT (DISPOSABLE) ×2 IMPLANT
SUCTION POOLE TIP (SUCTIONS) ×2 IMPLANT
SUT MNCRL AB 4-0 PS2 18 (SUTURE) ×2 IMPLANT
SUT VIC AB 3-0 SH 8-18 (SUTURE) ×2 IMPLANT
SYR CONTROL 10ML LL (SYRINGE) ×2 IMPLANT
TUBING SUCTION BULK 100 FT (MISCELLANEOUS) ×2 IMPLANT

## 2011-12-21 NOTE — Anesthesia Postprocedure Evaluation (Signed)
Anesthesia Post Note  Patient: Jessica Simpson  Procedure(s) Performed: Procedure(s) (LRB): BREAST LUMPECTOMY WITH NEDLE LOCALIZATION AND AXILLARY LYMPH NODE DISSECTION (Left) REMOVAL PORT-A-CATH (N/A)  Anesthesia type: General  Patient location: PACU  Post pain: Pain level controlled  Post assessment: Patient's Cardiovascular Status Stable  Last Vitals:  Filed Vitals:   12/21/11 1530  BP:   Pulse:   Temp:   Resp: 16    Post vital signs: Reviewed and stable  Level of consciousness: alert  Complications: No apparent anesthesia complications

## 2011-12-21 NOTE — Interval H&P Note (Signed)
History and Physical Interval Note: Pt with significant hematoma L breast and ? Axilla after needle localized lumpectomy and ALND.  Pt felt "pop" and had increased pain and bright red bloody drain output.  Now L breast tense and significantly enlarged.   12/21/2011 9:55 PM  Jessica Simpson  has presented today for surgery, with the diagnosis of infected left breast  The various methods of treatment have been discussed with the patient and family. After consideration of risks, benefits and other options for treatment, the patient has consented to  Procedure(s) (LRB): EVACUATION HEMATOMA BREAST (Left) as a surgical intervention .  The patients' history has been reviewed, patient examined, no change in status, stable for surgery.  I have reviewed the patients' chart and labs.  Questions were answered to the patient's satisfaction.     Mukesh Kornegay

## 2011-12-21 NOTE — Interval H&P Note (Signed)
History and Physical Interval Note:  12/21/2011 11:42 AM  Jessica Simpson  has presented today for surgery, with the diagnosis of left breast cancer  The various methods of treatment have been discussed with the patient and family. After consideration of risks, benefits and other options for treatment, the patient has consented to  Procedure(s) (LRB): BREAST LUMPECTOMY WITH NEDLE LOCALIZATION AND AXILLARY LYMPH NODE DISSECTION (Left) REMOVAL PORT-A-CATH (N/A) as a surgical intervention .  The patients' history has been reviewed, patient examined, no change in status, stable for surgery.  I have reviewed the patients' chart and labs.  Questions were answered to the patient's satisfaction.  I have marked the left side as the operative side and reviewed the wire localizing films   Jessica Simpson J  n

## 2011-12-21 NOTE — Brief Op Note (Signed)
During 2000 assessment patient complained of increased pain to left breast. States she felt "a pop". JP drain empty of 75 ml of bright red bloody drainage. Dr. Donell Beers on call, she will be in to see patient. Patient medicated for pain of level 5 with morphine 1 mg iv. 2020 Dr .Donell Beers at bedside to examine patient, plans to transfer to [facility] OR for evacuation of hematoma. Consent obtained from patients mother

## 2011-12-21 NOTE — Anesthesia Preprocedure Evaluation (Addendum)
Anesthesia Evaluation  Patient identified by MRN, date of birth, ID band Patient awake    Reviewed: Allergy & Precautions, H&P , NPO status , Patient's Chart, lab work & pertinent test results, reviewed documented beta blocker date and time   History of Anesthesia Complications Negative for: history of anesthetic complications  Airway Mallampati: I TM Distance: >3 FB Neck ROM: Full    Dental  (+) Teeth Intact and Dental Advisory Given   Pulmonary neg pulmonary ROS,  clear to auscultation        Cardiovascular Regular Normal    Neuro/Psych    GI/Hepatic   Endo/Other  Hyperthyroidism   Renal/GU      Musculoskeletal   Abdominal   Peds  Hematology   Anesthesia Other Findings   Reproductive/Obstetrics left breast cancer                         Anesthesia Physical Anesthesia Plan  ASA: II and Emergent  Anesthesia Plan: General   Post-op Pain Management:    Induction: Intravenous, Rapid sequence and Cricoid pressure planned  Airway Management Planned: Oral ETT  Additional Equipment:   Intra-op Plan:   Post-operative Plan: Extubation in OR  Informed Consent: I have reviewed the patients History and Physical, chart, labs and discussed the procedure including the risks, benefits and alternatives for the proposed anesthesia with the patient or authorized representative who has indicated his/her understanding and acceptance.   Dental advisory given  Plan Discussed with: CRNA, Anesthesiologist and Surgeon  Anesthesia Plan Comments: (Post operative hematoma )        Anesthesia Quick Evaluation

## 2011-12-21 NOTE — Op Note (Signed)
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  Surgeon: Currie Paris, MD, FACS  Assistant: Dr. 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 She has undergone neoadjuvant therapy with considerable improvement in the size of the tumor and the axillary exam she presents now for left partial mastectomy with axillary node dissection and Port-A-Cath removal. Although there is still a palpable area of abnormality we elected the headache I were placed we certainly were removing the correct area..    Description of Procedure: I saw the patient in the preoperative area and reviewed the plans with the patient and her family. I marked the left breast as the operative side. I reviewed the guidewire localizing films which showed the guidewire to enter well above and well medial to the palpable mass. It appeared to go into the mass.  The patient was taken to the operating room and after satisfactory general anesthesia had been obtained the left breast and upper right chest were prepped and draped as a sterile field. A timeout was done. Prior to prepping I used the ultrasound to confirm that the palpable abnormality appeared to be the residual cancer. I made a curvilinear incision over the mass. I. Elevated thin skin flaps in all directions. The guidewire was manipulated into the wound. I then excised the entire mass palpation to try to be sure I had adequate margins. By palpation the mass was out with normal tissue in all directions around it. A specimen mammogram showed the clip to be in  the center of the specimen. The specimen was inked for margin identification. There is a little bit of fatty tissue on the muscle left positive this off as additional deep margin and took a little bit more of the inferior lateral margin which is where I thought the tumor was most likely to be closest in terms of margin.  I infiltrated about 20 cc of 0.25% plain Marcaine. I irrigated to make sure everything was dry. The clips and a marked the margins. I closed the layers with 3-0 Vicryl, 4-0 Monocryl subcuticular, and Dermabond.  I then made a transverse axillary incision and divide the subcutaneous tissues with cautery. A self-retaining retractor was placed. I divided the axillary fat plus soft chest wall and could identify the latissimus and pectoralis margins.I then opened the clavipectoral fascia. I removed the axillary contents by a combination of blunt dissection. Identified the axillary vein to be sure that was not injured. The axillary contents were then swept from medial to lateral and superior to inferior. Multiple abnormal nodes were noted. I preserved both the long thoracic and thoracodorsal nerves. At completion was unable to palpate any abnormal tissue. Both nerves function when pinched.I put 5 cc of Marcaine around the II nerves. I placed a 19 Blake drain and secured it with a 2-0 nylon. I irrigated and made sure everything was dry and then closed in layers with 3-0 Vicryl, 4-0 Monocryl subcuticular, and Dermabond.  Attention was then turned to the Port-A-Cath site. I opened the old scar. The Port-A-Cath was identified and the 2 Prolene sutures holding it in place were cut. The port was manipulated onto the anterior chest wall and a figure-of-eight suture we  surround the tract of the tubing. The tubing was then backed out with the suture tied down. This prevented backbleeding. The incision was then closed with 3-0 Vicryl, 4-0 Monocryl subcuticular, and Dermabond.  The patient tolerated the  procedure well. There were no operative complications. All counts were correct.  Currie Paris, MD, FACS 12/21/2011 2:17 PM

## 2011-12-21 NOTE — H&P (View-Only) (Signed)
  CC: Breast cancer evaluation after neoadjuvant chemotherapy HPI: I saw this patient initially several months ago (September, 2012) with a 7 cm breast cancer in the upper outer quadrant of the left breast and known axillary metastases. We placed a Port-A-Cath for chemotherapy and she has completed her chemotherapy the last week. She presents today for evaluation and discussion of surgical options. She notes that her cancer has gotten much smaller and that she has difficulty feeling it. She also wishes to have her Port-A-Cath removed.   ROS: See note in Epic  MEDS:  No current outpatient prescriptions on file.      ALLERGIES:  Allergies  Allergen Reactions  . Codeine     Nausea, vomiting, pain   . Lortab      PE GENERAL:  The patient is alert, oriented, and generally healthy-appearing, NAD. Mood and affect are normal.  HEENT:  The head is normocephalic, the eyes nonicteric, the pupils were round regular and equal. EOMs are normal. Pharynx normal. Dentition good.  NECK:  The neck is supple and there are no masses or thyromegaly.  LUNGS: Normal respirations and clear to auscultation.  HEART: Regular rhythm, with no murmurs rubs or gallops. Pulses are intact carotid dorsalis pedis and posterior tibial. No significant varicosities are noted.  BREASTS: The left breast has a subtle mass in the 1;30 position about three fingerbreadths from the areolar margin.   LYMPHATICS: I do not feel any axillary nodes today  ABDOMEN: Soft, flat, and nontender. No masses or organomegaly is noted. No hernias are noted. Bowel sounds are normal.  EXTREMITIES:  Good range of motion, no edema.   Data Reviewed I have looked over our old nodes plus the notes from the oncologist  Assessment Significant clinical response to neoadjuvant chemotherapy for a left breast cancer. By physical examination this should be amenable to a lumpectomy with node dissection.  Plan We will tentatively  schedule her for a needle localized lumpectomy and axillary node dissection for her left breast cancer. We will also plan to remove her Port-A-Cath at her request. I discussed this with her and I think she understands. She knows that she will need to stay in the hospital overnight. She understands that she will have a drain tube. We discussed the fact that we sometimes have to go back for a second procedure to clear the margins if we did not get negative margins at the initial surgery.

## 2011-12-21 NOTE — Anesthesia Procedure Notes (Signed)
Procedure Name: LMA Insertion Date/Time: 12/21/2011 12:31 PM Performed by: Jearld Shines Pre-anesthesia Checklist: Patient identified, Emergency Drugs available, Suction available and Patient being monitored Patient Re-evaluated:Patient Re-evaluated prior to inductionOxygen Delivery Method: Circle System Utilized Preoxygenation: Pre-oxygenation with 100% oxygen Intubation Type: IV induction Ventilation: Mask ventilation without difficulty LMA: LMA inserted LMA Size: 4.0 Number of attempts: 1 Airway Equipment and Method: bite block Placement Confirmation: positive ETCO2 Tube secured with: Tape Dental Injury: Teeth and Oropharynx as per pre-operative assessment

## 2011-12-21 NOTE — Anesthesia Preprocedure Evaluation (Deleted)
Anesthesia Evaluation  Patient identified by MRN, date of birth, ID band Patient awake    Reviewed: Allergy & Precautions, H&P , NPO status , Patient's Chart, lab work & pertinent test results  Airway Mallampati: I TM Distance: >3 FB     Dental  (+) Teeth Intact and Dental Advisory Given   Pulmonary  clear to auscultation        Cardiovascular Regular Normal    Neuro/Psych    GI/Hepatic   Endo/Other    Renal/GU      Musculoskeletal   Abdominal   Peds  Hematology   Anesthesia Other Findings   Reproductive/Obstetrics                           Anesthesia Physical Anesthesia Plan  ASA: II and Emergent  Anesthesia Plan: General   Post-op Pain Management:    Induction: Intravenous  Airway Management Planned: Oral ETT  Additional Equipment:   Intra-op Plan:   Post-operative Plan: Extubation in OR  Informed Consent: I have reviewed the patients History and Physical, chart, labs and discussed the procedure including the risks, benefits and alternatives for the proposed anesthesia with the patient or authorized representative who has indicated his/her understanding and acceptance.   Dental advisory given  Plan Discussed with: CRNA, Anesthesiologist and Surgeon  Anesthesia Plan Comments:         Anesthesia Quick Evaluation

## 2011-12-21 NOTE — Discharge Instructions (Signed)
Call your surgeon if you experience:   1.  Fever over 101.0. 2.  Inability to urinate. 3.  Nausea and/or vomiting. 4.  Extreme swelling or bruising at the surgical site. 5.  Continued bleeding from the incision. 6.  Increased pain, redness or drainage from the incision. 7.  Problems related to your pain medication.    [facility]  [address] [place], [place] [address] [phone]   Post Anesthesia Home Care Instructions  Activity: Get plenty of rest for the remainder of the day. A responsible adult should stay with you for 24 hours following the procedure.  For the next 24 hours, DO NOT: -Drive a car -Advertising copywriter -Drink alcoholic beverages -Take any medication unless instructed by your physician -Make any legal decisions or sign important papers.  Meals: Start with liquid foods such as gelatin or soup. Progress to regular foods as tolerated. Avoid greasy, spicy, heavy foods. If nausea and/or vomiting occur, drink only clear liquids until the nausea and/or vomiting subsides. Call your physician if vomiting continues.  Special Instructions/Symptoms: Your throat may feel dry or sore from the anesthesia or the breathing tube placed in your throat during surgery. If this causes discomfort, gargle with warm salt water. The discomfort should disappear within 24 hours       About my Jackson-Pratt Bulb Drain  What is a Jackson-Pratt bulb? A Jackson-Pratt is a soft, round device used to collect drainage. It is connected to a long, thin drainage catheter, which is held in place by one or two small stiches near your surgical incision site. When the bulb is squeezed, it forms a vacuum, forcing the drainage to empty into the bulb.  Emptying the Jackson-Pratt bulb- To empty the bulb: 1. Release the plug on the top of the bulb. 2. Pour the bulb's contents into a measuring container which your nurse will provide. 3. Record the time emptied and amount of  drainage. Empty the drain(s) as often as your     doctor or nurse recommends.  Date                  Time                    Amount (Drain 1)                 Amount (Drain 2)  _____________________________________________________________________  _____________________________________________________________________  _____________________________________________________________________  _____________________________________________________________________  _____________________________________________________________________  _____________________________________________________________________  _____________________________________________________________________  _____________________________________________________________________  Squeezing the Jackson-Pratt Bulb- To squeeze the bulb: 1. Make sure the plug at the top of the bulb is open. 2. Squeeze the bulb tightly in your fist. You will hear air squeezing from the bulb. 3. Replace the plug while the bulb is squeezed. 4. Use a safety pin to attach the bulb to your clothing. This will keep the catheter from     pulling at the bulb insertion site.  When to call your doctor- Call your doctor if:  Drain site becomes red, swollen or hot.  You have a fever greater than 101 degrees F.  There is oozing at the drain site.  Drain falls out (apply a guaze bandage over the drain hole and secure it with tape).  Drainage increases daily not related to activity patterns. (You will usually have more drainage when you are active than when you are resting.)  Drainage has a bad odor.   Marland Kitchen

## 2011-12-21 NOTE — Anesthesia Preprocedure Evaluation (Signed)
Anesthesia Evaluation  Patient identified by MRN, date of birth, ID band Patient awake    Airway Mallampati: I TM Distance: >3 FB Neck ROM: Full    Dental  (+) Teeth Intact and Dental Advisory Given   Pulmonary  clear to auscultation        Cardiovascular Regular Normal    Neuro/Psych    GI/Hepatic   Endo/Other  Hyperthyroidism   Renal/GU      Musculoskeletal   Abdominal   Peds  Hematology   Anesthesia Other Findings   Reproductive/Obstetrics                           Anesthesia Physical Anesthesia Plan  ASA: II and Emergent  Anesthesia Plan: General   Post-op Pain Management:    Induction: Intravenous  Airway Management Planned: Oral ETT  Additional Equipment:   Intra-op Plan:   Post-operative Plan: Extubation in OR  Informed Consent: I have reviewed the patients History and Physical, chart, labs and discussed the procedure including the risks, benefits and alternatives for the proposed anesthesia with the patient or authorized representative who has indicated his/her understanding and acceptance.   Dental advisory given  Plan Discussed with: CRNA and Anesthesiologist  Anesthesia Plan Comments:         Anesthesia Quick Evaluation

## 2011-12-21 NOTE — Transfer of Care (Signed)
Immediate Anesthesia Transfer of Care Note  Patient: Jessica Simpson  Procedure(s) Performed: Procedure(s) (LRB): BREAST LUMPECTOMY WITH NEDLE LOCALIZATION AND AXILLARY LYMPH NODE DISSECTION (Left) REMOVAL PORT-A-CATH (N/A)  Patient Location: PACU  Anesthesia Type: General  Level of Consciousness: sedated  Airway & Oxygen Therapy: Patient Spontanous Breathing and Patient connected to face mask oxygen  Post-op Assessment: Report given to PACU RN and Post -op Vital signs reviewed and stable  Post vital signs: Reviewed and stable  Complications: No apparent anesthesia complications

## 2011-12-21 NOTE — Anesthesia Preprocedure Evaluation (Signed)
Anesthesia Evaluation  Patient identified by MRN, date of birth, ID band Patient awake    Reviewed: Allergy & Precautions, H&P , NPO status , Patient's Chart, lab work & pertinent test results  History of Anesthesia Complications Negative for: history of anesthetic complications  Airway Mallampati: II TM Distance: >3 FB Neck ROM: Full    Dental No notable dental hx. (+) Teeth Intact and Dental Advisory Given   Pulmonary neg pulmonary ROS,  clear to auscultation  Pulmonary exam normal       Cardiovascular neg cardio ROS Regular Normal 12/12 ECHO- LVH, EF 65-70%   Neuro/Psych Negative Neurological ROS     GI/Hepatic negative GI ROS, Neg liver ROS,   Endo/Other  Morbid obesity  Renal/GU negative Renal ROS     Musculoskeletal   Abdominal (+) obese,   Peds  Hematology Anemic with chemo, HB 9.8   Anesthesia Other Findings   Reproductive/Obstetrics LMP 10/12, has not had period since began chemo, declines preg test stating no chance she is pregnant                           Anesthesia Physical Anesthesia Plan  ASA: III  Anesthesia Plan: General   Post-op Pain Management:    Induction: Intravenous  Airway Management Planned: LMA  Additional Equipment:   Intra-op Plan:   Post-operative Plan:   Informed Consent: I have reviewed the patients History and Physical, chart, labs and discussed the procedure including the risks, benefits and alternatives for the proposed anesthesia with the patient or authorized representative who has indicated his/her understanding and acceptance.   Dental advisory given  Plan Discussed with: CRNA and Surgeon  Anesthesia Plan Comments: (Plan routine monitors, GA- LMA OK)        Anesthesia Quick Evaluation

## 2011-12-22 DIAGNOSIS — IMO0002 Reserved for concepts with insufficient information to code with codable children: Secondary | ICD-10-CM

## 2011-12-22 LAB — CBC
HCT: 27.5 % — ABNORMAL LOW (ref 36.0–46.0)
Hemoglobin: 8.8 g/dL — ABNORMAL LOW (ref 12.0–15.0)
MCHC: 32 g/dL (ref 30.0–36.0)
RBC: 3.24 MIL/uL — ABNORMAL LOW (ref 3.87–5.11)
WBC: 8.8 10*3/uL (ref 4.0–10.5)

## 2011-12-22 MED ORDER — MORPHINE SULFATE 2 MG/ML IJ SOLN: 0.0500 mg/kg | INTRAMUSCULAR | Status: DC | PRN

## 2011-12-22 MED ORDER — KCL IN DEXTROSE-NACL 20-5-0.45 MEQ/L-%-% IV SOLN
INTRAVENOUS | Status: DC
  Administered 2011-12-22: 03:00:00 via INTRAVENOUS
  Filled 2011-12-22 (×3): qty 1000

## 2011-12-22 MED ORDER — HYDROMORPHONE HCL 2 MG PO TABS: 2.0000 mg | ORAL_TABLET | Freq: Two times a day (BID) | ORAL | Status: DC | PRN

## 2011-12-22 MED ORDER — ONDANSETRON HCL 4 MG/2ML IJ SOLN: 4.0000 mg | Freq: Once | INTRAMUSCULAR | Status: DC | PRN

## 2011-12-22 MED ORDER — BUPIVACAINE-EPINEPHRINE 0.25% -1:200000 IJ SOLN
INTRAMUSCULAR | Status: DC | PRN
  Administered 2011-12-22: 20 mL

## 2011-12-22 MED ORDER — MEPERIDINE HCL 25 MG/ML IJ SOLN: 6.2500 mg | INTRAMUSCULAR | Status: DC | PRN

## 2011-12-22 MED ORDER — HYDROMORPHONE HCL PF 1 MG/ML IJ SOLN: 0.2500 mg | INTRAMUSCULAR | Status: DC | PRN

## 2011-12-22 MED ORDER — DIPHENHYDRAMINE HCL 50 MG/ML IJ SOLN: 12.5000 mg | Freq: Four times a day (QID) | INTRAMUSCULAR | Status: DC | PRN

## 2011-12-22 MED ORDER — DIPHENHYDRAMINE HCL 12.5 MG/5ML PO ELIX: 12.5000 mg | ORAL_SOLUTION | Freq: Four times a day (QID) | ORAL | Status: DC | PRN

## 2011-12-22 NOTE — Op Note (Signed)
Evacuation of left breast hematoma  Pre-operative Diagnosis: Left breast cancer, post op hematoma  Post-operative Diagnosis: same  Indications: Pt is POD 0, felt "pop" with increased pain and swelling left breast.    Anesthesia: General and 1/4% marcaine with epinephrine.  Procedure Details  The procedure, risks and complications have been discussed in detail (including, infection, bleeding, need for additional procedures) with the patient, and the patient has signed consent to the procedure.   The patient was identified in the holding area and taken to the OR where she was placed supine on the OR table.  The left chest and upper arm were prepped and draped sterilely.  The prior incision was opened with the #15 blade.  300 mL of blood and clot were evacuated.  The area was examined for hemostasis.  There were a few areas of oozing on the muscle edge, but no arterial bleeding.  Hemostasis was achieved with the cautery.  The cavity was irrigated.  Local anesthesia was induced.  The wound was closed in multiple layers with undyed 3-0 vicryl deep dermal sutures and 4-0 monocryl running subcuticular suture.  The wound was dressed with steristrips, gauze, and the breast binder  Findings: 300 mL of blood and clot in the cavity  EBL: 29 cc's  Drains: 19 blake was left in place  Condition: Tolerated procedure well   Complications: none.

## 2011-12-22 NOTE — Anesthesia Postprocedure Evaluation (Signed)
  Anesthesia Post-op Note  Patient: Jessica Simpson  Procedure(s) Performed: Procedure(s) (LRB): EVACUATION HEMATOMA BREAST (Left)  Patient Location:   Anesthesia Type: General  Level of Consciousness: awake and alert   Airway and Oxygen Therapy: Patient Spontanous Breathing and Patient connected to nasal cannula oxygen  Post-op Pain: mild  Post-op Assessment: Post-op Vital signs reviewed, Patient's Cardiovascular Status Stable, Respiratory Function Stable, Patent Airway, No signs of Nausea or vomiting and Pain level controlled  Post-op Vital Signs: Reviewed and stable  Complications: No apparent anesthesia complications

## 2011-12-22 NOTE — Progress Notes (Signed)
1 Day Post-Op  Subjective: Feels better s/p lumpectomy, ALND, retuern to OR for bleed. Pain controlled, diet tolerated  Objective: Vital signs in last 24 hours: Temp:  [97.8 F (36.6 C)-98.7 F (37.1 C)] 98.1 F (36.7 C) (02/27 0641) Pulse Rate:  [85-98] 94  (02/27 0641) Resp:  [14-20] 16  (02/27 0641) BP: (119-170)/(67-99) 147/72 mmHg (02/27 0641) SpO2:  [97 %-100 %] 100 % (02/27 0641)   Intake/Output from previous day: 02/26 0701 - 02/27 0700 In: 4567 [P.O.:1422; I.V.:3045] Out: 994.7 [Urine:800; Drains:165; Blood:29.7] Intake/Output this shift:     General appearance: alert and no distress  Incision: Both incisions clean, no hematoma, mild eccymosis, JP thin  Lab Results:   Basename 12/22/11 0605 12/21/11 1135  WBC 8.8 --  HGB 8.8* 10.5*  HCT 27.5* --  PLT 286 --   BMET No results found for this basename: NA:2,K:2,CL:2,CO2:2,GLUCOSE:2,BUN:2,CREATININE:2,CALCIUM:2 in the last 72 hours PT/INR No results found for this basename: LABPROT:2,INR:2 in the last 72 hours ABG No results found for this basename: PHART:2,PCO2:2,PO2:2,HCO3:2 in the last 72 hours  MEDS, Scheduled    .  ceFAZolin (ANCEF) IV  2 g Intravenous 60 min Pre-Op  . DISCONTD: chlorhexidine  1 application Topical Once  . DISCONTD: chlorhexidine  1 application Topical Once  . DISCONTD: sodium chloride  3 mL Intravenous Q12H    Studies/Results: Mm Breast Surgical Specimen  12/21/2011  *RADIOLOGY REPORT*  Clinical Data:  Left breast cancer for needle localization.  NEEDLE LOCALIZATION WITH MAMMOGRAPHIC GUIDANCE AND SPECIMEN RADIOGRAPH  Patient presents for needle localization prior to left breast surgery.  I met with the patient and we discussed the procedure of needle localization including benefits and alternatives. We discussed the high likelihood of a successful procedure. We discussed the risks of the procedure, including infection, bleeding, tissue injury, and further surgery. Informed, written  consent was given.  Using mammographic guidance, sterile technique, 2% lidocaine and a 7 cm modified Kopans needle, mass, calcification, biopsy clip were localized using a cranial approach.  The films are marked for Dr. Jamey Ripa.  Specimen radiograph was performed at day surgery, and confirms mass, calcifications, wire, biopsy clip present in the tissue sample.  The specimen is marked for pathology.  IMPRESSION: Needle localization left breast.  No apparent complications.  Original Report Authenticated By: Sherian Rein, M.D.   Mm Breast Wire Localization Left  12/21/2011  *RADIOLOGY REPORT*  Clinical Data:  Left breast cancer for needle localization.  NEEDLE LOCALIZATION WITH MAMMOGRAPHIC GUIDANCE AND SPECIMEN RADIOGRAPH  Patient presents for needle localization prior to left breast surgery.  I met with the patient and we discussed the procedure of needle localization including benefits and alternatives. We discussed the high likelihood of a successful procedure. We discussed the risks of the procedure, including infection, bleeding, tissue injury, and further surgery. Informed, written consent was given.  Using mammographic guidance, sterile technique, 2% lidocaine and a 7 cm modified Kopans needle, mass, calcification, biopsy clip were localized using a cranial approach.  The films are marked for Dr. Jamey Ripa.  Specimen radiograph was performed at day surgery, and confirms mass, calcifications, wire, biopsy clip present in the tissue sample.  The specimen is marked for pathology.  IMPRESSION: Needle localization left breast.  No apparent complications.  Original Report Authenticated By: Sherian Rein, M.D.    Assessment: s/p Procedure(s): EVACUATION HEMATOMA BREAST Doing well  Plan: Discharge   LOS: 1 day     Currie Paris, MD, [facility], [place] [phone]  12/22/2011 8:31 AM

## 2011-12-22 NOTE — Transfer of Care (Signed)
Immediate Anesthesia Transfer of Care Note  Patient: Jessica Simpson  Procedure(s) Performed: Procedure(s) (LRB): EVACUATION HEMATOMA BREAST (Left)  Patient Location: PACU  Anesthesia Type: General  Level of Consciousness: awake, alert , oriented and patient cooperative  Airway & Oxygen Therapy: Patient Spontanous Breathing and Patient connected to nasal cannula oxygen  Post-op Assessment: Report given to PACU RN, Post -op Vital signs reviewed and stable and Patient moving all extremities  Post vital signs: Reviewed and stable  Complications: No apparent anesthesia complications

## 2011-12-22 NOTE — Progress Notes (Signed)
UR of chart complete.  

## 2011-12-23 ENCOUNTER — Telehealth (INDEPENDENT_AMBULATORY_CARE_PROVIDER_SITE_OTHER): Payer: Self-pay | Admitting: General Surgery

## 2011-12-23 ENCOUNTER — Encounter (HOSPITAL_BASED_OUTPATIENT_CLINIC_OR_DEPARTMENT_OTHER): Payer: Self-pay | Admitting: Surgery

## 2011-12-23 NOTE — Telephone Encounter (Signed)
Message copied by Liliana Cline on Thu Dec 23, 2011  4:35 PM ------      Message from: Currie Paris      Created: Thu Dec 23, 2011 12:39 PM       Tell the patient that her margins are OK and a few lymph nodes still had a little tumor. I will discuss in detail in the office.

## 2011-12-23 NOTE — Telephone Encounter (Signed)
Patient made aware of path results. Will follow up at appt and call with any questions prior.  

## 2011-12-24 ENCOUNTER — Ambulatory Visit (HOSPITAL_COMMUNITY): Payer: 59

## 2011-12-25 ENCOUNTER — Encounter (HOSPITAL_COMMUNITY): Payer: Self-pay | Admitting: General Surgery

## 2011-12-27 ENCOUNTER — Encounter (INDEPENDENT_AMBULATORY_CARE_PROVIDER_SITE_OTHER): Payer: Self-pay | Admitting: Surgery

## 2011-12-27 ENCOUNTER — Ambulatory Visit (INDEPENDENT_AMBULATORY_CARE_PROVIDER_SITE_OTHER): Payer: 59 | Admitting: Surgery

## 2011-12-27 VITALS — BP 146/94 | HR 98 | Temp 97.1°F | Resp 18 | Ht 65.0 in | Wt 205.0 lb

## 2011-12-27 DIAGNOSIS — C50912 Malignant neoplasm of unspecified site of left female breast: Secondary | ICD-10-CM

## 2011-12-27 DIAGNOSIS — C50919 Malignant neoplasm of unspecified site of unspecified female breast: Secondary | ICD-10-CM

## 2011-12-27 NOTE — Progress Notes (Signed)
Jessica Simpson    [id number] 12/27/2011    1971/08/28   CC: Post op lumpectomy/node dissection  HPI: The patient returns for post op follow-up. She underwent a left lumpectomy/axillary dissection on 12/21/11. Marland Kitchen Over all she feels that she is doing well. She had to return to the OR to drain an hematoma of the breast that developed the night of surgery. Since then she has felt OK.   PE: The incision is healing nicely and there is no evidence of infection or hematoma.  The drains are slowing but still > 50cc/24.  DATA REVIEWED: Pathology report showed 1.7 cm residual cancer with 3/7 nodes +  IMPRESSION: Patient doing well. Drain not ready to remove. Partial response to chemo  PLAN: Her next visit will be in one week, sooner if drain slows. She is in the process of rescheduling a thyroid biopsy to follow up on a nodule.

## 2011-12-27 NOTE — Patient Instructions (Signed)
When the drain slows to less than 30cc in 24 hours call us so we can remove it. See me again next week

## 2011-12-28 NOTE — Discharge Summary (Signed)
Physician Discharge Summary  Patient ID: Jessica Simpson MRN: [id number] DOB/AGE: Nov 11, 1970 40 y.o.  Admit date: 12/21/2011 Discharge date; 12/22/2011 Admission Diagnoses:Left breast cancer, s/p neoadjuvant chemo  Discharge Diagnoses:  Same Post op wound hematoma  Discharged Condition: good  Hospital Course: Patient underwent lumpectomy and axillary dissection. The evening of surgery developed a hematoma at the lumpectomy site and was returned to the OR for evacuation. No active source of bleeding was found. She did well post op and was discharged the following day  Consults: None  Significant Diagnostic Studies: None  Treatments: surgery: Lumpectomy, ax dissection, wound exploration for bleeding  Discharge Exam: Blood pressure 147/72, pulse 94, temperature 98.1 F (36.7 C), temperature source Oral, resp. rate 16, SpO2 100.00%. See progress note day of discharge  Disposition: 01-Home or Self Care  Discharge Orders    Future Appointments: Provider: Department: Dept Phone: Center:   01/04/2012 10:00 AM [facility] [phone] None   01/04/2012 10:50 AM Jessica An, MD [facility] [phone] None   01/04/2012 4:00 PM Jessica Paris, MD [facility] [phone] None     Future Orders Please Complete By Expires   Diet - low sodium heart healthy      Increase activity slowly      Discharge instructions      Comments:   [facility] [phone]   BREAST BIOPSY/ PARTIAL MASTECTOMY: POST OP INSTRUCTIONS  Always review your discharge instruction sheet given to you by the facility where your surgery was performed.  IF YOU HAVE DISABILITY OR FAMILY LEAVE FORMS, YOU MUST BRING THEM TO THE OFFICE FOR PROCESSING.  DO NOT GIVE THEM TO YOUR DOCTOR.  A prescription for pain medication will be given to you upon discharge.  Take your pain medication as prescribed, as needed.  If narcotic pain medicine is not needed, then  you may take ibuprofen (Advil) as needed. Take your usually prescribed medications unless otherwise directed If you need a refill on your pain medication, please contact your pharmacy.  They will contact our office to request authorization.  Prescriptions will not be filled after 5pm or on week-ends. You should eat very light the first 24 hours after surgery, such as soup, crackers, pudding, etc.  Resume your normal diet the day after surgery. Most patients will experience some swelling and bruising in the breast.  Ice packs and a good support bra will help.  Swelling and bruising can take several days to resolve.  It is common to experience some constipation if taking pain medication after surgery.  Increasing fluid intake and taking a stool softener will usually help or prevent this problem from occurring.  A mild laxative (Milk of Magnesia or Miralax) should be taken according to package directions if there are no bowel movements after 48 hours. Unless discharge instructions indicate otherwise, you may remove your bandages 24 hours after surgery, and you may shower at that time.  If your surgeon used skin glue on the incision, you may shower in 24 hours.  The glue will flake off over the next 2-3 weeks. DRAINS:  If you have drain, it is important to keep a list of the amount of drainage produced each day in your drains.  Before leaving the hospital, you should be instructed on drain care.  Call our office if you have any questions about your drains. BE SURE TO BRING THE RECORD OF THE AMOUNT OF DRAINAGE TO YOUR OFFICE VISITS. We use this to determine when  the drains can be removed. ACTIVITIES:  You may resume regular daily activities (gradually increasing) beginning the next day.  Wearing a good support bra or sports bra minimizes pain and swelling.  You may have sexual intercourse when it is comfortable. You may drive when you no longer are taking prescription pain medication, you can comfortably wear a  seatbelt, and you can safely maneuver your car and apply brakes. RETURN TO WORK:  ______________________________________________________________________________________ Jessica Simpson should see your doctor in the office for a follow-up appointment approximately two weeks after your surgery.  Your doctor's nurse will typically make your follow-up appointment when she calls you with your pathology report.  Expect your pathology report 2-3 business days after your surgery.  You may call to check if you do not hear from [place] after three days. OTHER INSTRUCTIONS: _______________________________________________________________________________________________ _____________________________________________________________________________________________________________________________________ _____________________________________________________________________________________________________________________________________ _____________________________________________________________________________________________________________________________________  WHEN TO CALL YOUR DOCTOR: Fever over 101.0 Nausea and/or vomiting. Extreme swelling or bruising. Continued bleeding from incision. Increased pain, redness, or drainage from the incision.  The clinic staff is available to answer your questions during regular business hours.  Please don't hesitate to call and ask to speak to one of the nurses for clinical concerns.  If you have a medical emergency, go to the nearest emergency room or call 911.  A surgeon from [facility] is always on call at the hospital.  [address], [address], [place], [place]  [address] ?  [address] [address], [place], [place]   [address]                           [phone] ? [phone] ? FAX [phone] Web site: [email or web address]   Discharge wound care:      Comments:   Empty drain twice a day and record the amount   Remove dressing in 24 hours         Medication List  As of 12/28/2011  7:23 AM   TAKE these medications         acetaminophen 500 MG tablet   Commonly known as: TYLENOL   Take 500-1,000 mg by mouth every 6 (six) hours as needed. For pain      gabapentin 300 MG capsule   Commonly known as: NEURONTIN   Take 300 mg by mouth 3 (three) times daily as needed. For foot pain      HYDROmorphone 2 MG tablet   Commonly known as: DILAUDID   Take 1 tablet (2 mg total) by mouth every 12 (twelve) hours as needed for pain.             Signed: Currie Simpson 12/28/2011, 7:23 AM

## 2011-12-29 ENCOUNTER — Other Ambulatory Visit (HOSPITAL_COMMUNITY): Payer: Self-pay | Admitting: Oncology

## 2011-12-29 NOTE — Progress Notes (Signed)
This office note has been dictated.

## 2011-12-31 ENCOUNTER — Encounter (HOSPITAL_COMMUNITY): Payer: Self-pay | Admitting: Oncology

## 2011-12-31 NOTE — Progress Notes (Signed)
TELEPHONE DISCUSSION She was presented at our breast cancer tumor board and unfortunately, still had tumor left in the breast 1.7 cm down from approximately 7 cm in size, but still had 3 nodes with extracapsular extension.  The largest nodal metastasis was 1.0 cm.  The other was around 0.4 cm.  Therefore, she had a very, very good response to dose dense FEC x4 cycles, and full dose Taxotere dose dense treatment for 4 cycles.  She is going to move on to radiation but she still has a very concerning process since she is triple negative and 41 years old.  So I discussed her case with Dr. Servando Salina at [facility] who states that they may have a protocol for post radiation therapy patient's like her utilizing bevacizumab and chronometric chemotherapy with what he thought was low- dose Cytoxan methotrexate.  I have discussed this therefore with Elease Hashimoto at length over the phone today and she is willing to go down there for consultation.  We will see what they say about her.  I have also talk to Dr. Basilio Cairo about her.  She will proceed with the radiation therapy as soon as she heals from the surgery and gets over the hematoma and will move on with a consultation with Dr. Garlon Hatchet of course probably in the next couple of weeks.    ______________________________ Ladona Horns. Mariel Sleet, MD ESN/MEDQ  D:  12/29/2011  T:  12/29/2011  Job:  [id number]

## 2012-01-04 ENCOUNTER — Ambulatory Visit (INDEPENDENT_AMBULATORY_CARE_PROVIDER_SITE_OTHER): Payer: 59 | Admitting: Surgery

## 2012-01-04 ENCOUNTER — Encounter (INDEPENDENT_AMBULATORY_CARE_PROVIDER_SITE_OTHER): Payer: 59 | Admitting: Surgery

## 2012-01-04 ENCOUNTER — Encounter (HOSPITAL_COMMUNITY): Payer: 59

## 2012-01-04 ENCOUNTER — Encounter (HOSPITAL_COMMUNITY): Payer: 59 | Admitting: Oncology

## 2012-01-04 ENCOUNTER — Encounter (INDEPENDENT_AMBULATORY_CARE_PROVIDER_SITE_OTHER): Payer: Self-pay | Admitting: Surgery

## 2012-01-04 VITALS — BP 136/80 | HR 80 | Resp 16 | Ht 65.0 in | Wt 206.0 lb

## 2012-01-04 DIAGNOSIS — C50919 Malignant neoplasm of unspecified site of unspecified female breast: Secondary | ICD-10-CM

## 2012-01-04 DIAGNOSIS — C50912 Malignant neoplasm of unspecified site of left female breast: Secondary | ICD-10-CM

## 2012-01-04 NOTE — Patient Instructions (Signed)
Call if the drain is less than 30 cc in 24 hours and we will get it out

## 2012-01-04 NOTE — Progress Notes (Signed)
Jessica Simpson    [id number] 01/04/2012    03-May-1971   CC: Post op lumpectomy/node dissection  HPI: The patient returns for post op follow-up. She underwent a left lumpectomy/axillary dissection on 12/21/11. Marland Kitchen Over all she feels that she is doing well. She had to return to the OR to drain an hematoma of the breast that developed the night of surgery. Since then she has felt OK.   PE: The incision is healing nicely and there is no evidence of infection or hematoma.  The drains are slowing but still >30  DATA REVIEWED: Pathology report showed 1.7 cm residual cancer with 3/7 nodes +  IMPRESSION: Patient doing well. Drain not ready to remove. Partial response to chemo  PLAN: Her next visit will be in one week, sooner if drain slows. She is in the process of rescheduling a thyroid biopsy to follow up on a nodule.

## 2012-01-06 ENCOUNTER — Telehealth (INDEPENDENT_AMBULATORY_CARE_PROVIDER_SITE_OTHER): Payer: Self-pay | Admitting: General Surgery

## 2012-01-06 NOTE — Telephone Encounter (Signed)
Patient left voicemail re: drain. I called back and left message for her to call me.

## 2012-01-07 ENCOUNTER — Ambulatory Visit (INDEPENDENT_AMBULATORY_CARE_PROVIDER_SITE_OTHER): Payer: 59 | Admitting: General Surgery

## 2012-01-07 DIAGNOSIS — Z4803 Encounter for change or removal of drains: Secondary | ICD-10-CM

## 2012-01-07 DIAGNOSIS — Z4889 Encounter for other specified surgical aftercare: Secondary | ICD-10-CM

## 2012-01-07 NOTE — Patient Instructions (Signed)
Call the office if you notice redness or swelling in axilla. Keep your follow up appt with Dr Jamey Ripa.

## 2012-01-07 NOTE — Telephone Encounter (Signed)
Called patient after she was transferred to my voicemail several times. Appt made for nurse only this am.

## 2012-01-07 NOTE — Progress Notes (Signed)
Patient comes in today status post lumpectomy with axillary dissection. Axillary drain is under 30 cc for last 36 hours. Drain removed. Bandage with triple antibiotic placed over wound. Patient instructed to keep covered 24 hours and just as needed after that. She will call with any problems and keep her follow up appt next week.

## 2012-01-13 ENCOUNTER — Ambulatory Visit (HOSPITAL_COMMUNITY): Admission: RE | Admit: 2012-01-13 | Payer: 59 | Source: Ambulatory Visit

## 2012-01-13 ENCOUNTER — Telehealth (INDEPENDENT_AMBULATORY_CARE_PROVIDER_SITE_OTHER): Payer: Self-pay

## 2012-01-13 NOTE — Telephone Encounter (Signed)
Spoke with Richard at [facility].  The radiologist, Dr. Linton Rump, cancelled the patient's [place] for today.  According to him, the size of the nodule does not meet their measurement criteria for a biopsy.  He recommended f/u imaging in 6 months to 1 year.  The patient was notified.  Dr. Linton Rump can be reached at [phone].

## 2012-01-14 ENCOUNTER — Encounter (INDEPENDENT_AMBULATORY_CARE_PROVIDER_SITE_OTHER): Payer: Self-pay | Admitting: Surgery

## 2012-01-14 ENCOUNTER — Ambulatory Visit (INDEPENDENT_AMBULATORY_CARE_PROVIDER_SITE_OTHER): Payer: 59 | Admitting: Surgery

## 2012-01-14 VITALS — BP 148/98 | HR 88 | Temp 97.4°F | Resp 18 | Ht 65.0 in | Wt 203.4 lb

## 2012-01-14 DIAGNOSIS — Z9889 Other specified postprocedural states: Secondary | ICD-10-CM

## 2012-01-14 NOTE — Patient Instructions (Signed)
Go to the ABC class.  See me again after you finish radiation.      Jessica Simpson  is OK to attend the ABC Class Currie Paris, MD, [facility], [place] [phone] 01/14/2012 9:18 AM

## 2012-01-14 NOTE — Progress Notes (Signed)
Jessica Simpson    [id number] 01/14/2012    09-May-1971   CC: Post op lumpectomy/node dissection  HPI: The patient returns for post op follow-up. She underwent a left lumpectomy/axillary dissection on 12/21/11. Marland Kitchen Over all she feels that she is doing well. She had to return to the OR to drain an hematoma of the breast that developed the night of surgery. Since then she has felt OK.   PE: The incision is healing nicely and there is no evidence of infection or hematoma.  Drain has been removed, no fluid reaccumulation   IMPRESSION: Patient doing well. OK to go to [facility]  PLAN: RTC after radiation. Thyroid nodule not biopsied as apparently did not meet guidelines for bx

## 2012-01-31 ENCOUNTER — Ambulatory Visit (HOSPITAL_COMMUNITY): Payer: 59 | Admitting: Oncology

## 2012-02-01 ENCOUNTER — Encounter (HOSPITAL_COMMUNITY): Payer: 59 | Attending: Oncology | Admitting: Oncology

## 2012-02-01 VITALS — BP 145/83 | HR 85 | Temp 97.4°F | Wt 206.7 lb

## 2012-02-01 DIAGNOSIS — C50919 Malignant neoplasm of unspecified site of unspecified female breast: Secondary | ICD-10-CM | POA: Insufficient documentation

## 2012-02-01 DIAGNOSIS — C773 Secondary and unspecified malignant neoplasm of axilla and upper limb lymph nodes: Secondary | ICD-10-CM

## 2012-02-01 DIAGNOSIS — C50912 Malignant neoplasm of unspecified site of left female breast: Secondary | ICD-10-CM

## 2012-02-01 DIAGNOSIS — C50419 Malignant neoplasm of upper-outer quadrant of unspecified female breast: Secondary | ICD-10-CM

## 2012-02-01 LAB — COMPREHENSIVE METABOLIC PANEL
ALT: 11 U/L (ref 0–35)
BUN: 10 mg/dL (ref 6–23)
CO2: 26 mEq/L (ref 19–32)
Calcium: 9.7 mg/dL (ref 8.4–10.5)
GFR calc Af Amer: >90 mL/min (ref >90)
GFR calc non Af Amer: >90 mL/min (ref >90)
Glucose, Bld: 108 mg/dL — ABNORMAL HIGH (ref 70–99)
Sodium: 140 mEq/L (ref 135–145)
Total Protein: 7.1 g/dL (ref 6.0–8.3)

## 2012-02-01 LAB — CBC
HCT: 33.7 % — ABNORMAL LOW (ref 36.0–46.0)
Hemoglobin: 10.7 g/dL — ABNORMAL LOW (ref 12.0–15.0)
MCH: 24.4 pg — ABNORMAL LOW (ref 26.0–34.0)
MCV: 76.8 fL — ABNORMAL LOW (ref 78.0–100.0)
RBC: 4.39 MIL/uL (ref 3.87–5.11)
WBC: 3.6 10*3/uL — ABNORMAL LOW (ref 4.0–10.5)

## 2012-02-01 LAB — DIFFERENTIAL
Eosinophils Absolute: 0.1 10*3/uL (ref 0.0–0.7)
Eosinophils Relative: 3 % (ref 0–5)
Lymphocytes Relative: 20 % (ref 12–46)
Lymphs Abs: 0.7 10*3/uL (ref 0.7–4.0)
Monocytes Absolute: 0.4 10*3/uL (ref 0.1–1.0)
Monocytes Relative: 11 % (ref 3–12)

## 2012-02-01 NOTE — Progress Notes (Signed)
This office note has been dictated.

## 2012-02-01 NOTE — Patient Instructions (Signed)
MARISSIA BLACKHAM  [id number] 04/01/1971  [name]  Discharge Instructions  RECOMMENDATIONS MADE BY THE CONSULTANT AND ANY TEST RESULTS WILL BE SENT TO YOUR REFERRING DOCTOR.   EXAM FINDINGS BY MD TODAY AND SIGNS AND SYMPTOMS TO REPORT TO CLINIC OR PRIMARY MD: We will get you a lymphaedema consult as soon as we can.  We will contact you with a date and time of your appointment.  MEDICATIONS PRESCRIBED: none   INSTRUCTIONS GIVEN AND DISCUSSED: Other :  Report any new lumps, bone pain or shortness of breath.  SPECIAL INSTRUCTIONS/FOLLOW-UP: Lab work Needed today and Return to Clinic in 3 months to see MD.   I acknowledge that I have been informed and understand all the instructions given to me and received a copy. I do not have any more questions at this time, but understand that I may call the Specialty Clinic at [facility] at [phone] during business hours should I have any further questions or need assistance in obtaining follow-up care.    __________________________________________  _____________  __________ Signature of Patient or Authorized Representative            Date                   Time    __________________________________________ Nurse's Signature

## 2012-02-01 NOTE — Progress Notes (Signed)
CC:   Jessica Simpson, M.D. Jessica Simpson, M.D. Jessica Salina, MD Jessica Simpson  DIAGNOSIS: 1. 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  She was     treated with dose dense FEC followed by dose dense Taxotere.  She     had cycle 4 of FEC on 09/14/2011 and completed cycle 4 of dose     dense Taxotere on 11/23/2011.  She then went on to have surgery by     Dr. Cicero Simpson on 12/21/2011.  She was still found to have a 1 cm     high-grade cancer with 3 of 7 positive nodes.  LVI was not seen.     She had macrometastasis 1.0 cm in one lymph node, 4 mm in another     node and she had isolated tumor cells in a third node.     Extracapsular extension was seen.  She is now undergoing radiation     therapy and is starting week 3. 2. BRCA1 and BRCA2 negativity. 3. 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  She has seen Dr.     Luciano Simpson at [facility] for this. 4. Excessive tearing secondary to chemotherapy. 5. Peripheral neuropathy, much improved off gabapentin presently. 6. Mild obesity.  Jessica Simpson is undergoing radiation but I did send her down to Dr. Garlon Simpson at [facility] for the possible inclusion in the ABCDE trial which should be opening this week.  She is still considering whether or not she wants to participate in it.  She is tired still but at least off the gabapentin.  She is having trouble sleeping, probably because of some anxiety more than anything. She is not back to work yet and probably cannot because of the fatigue and weakness.  She of course is living with her daughter.  She still has her significant other.  That relationship is holding up fairly well but not perfectly.  REVIEW OF SYSTEMS:  Negative.  PHYSICAL EXAMINATION: Vital signs today  show that her weight is 206 pounds.  Blood pressure 145/83 right arm sitting position, pulse is 88 and regular.  She has respirations of 16 to 20.  She denies any pain.  She has mild lymphedema of the left arm so we will set her up with our lymphedema specialist as soon as possible.  The right arm was without edema, legs without edema. She has no adenopathy.  The left breast is slightly puffy, slightly tender.  Incisions are healing nicely.  The right breast was not examined today.  The old Port-A-Cath site is intact.  Her lungs are clear to auscultation and percussion.  Heart shows a regular rhythm and rate without murmur, rub or gallop.  Abdomen soft and nontender.  Bowel sounds are normal.  She has no masses.  She has no obvious thyromegaly.  I think she looks good.  We will get a CBC, diff and CMET.  I went over the study with her as best I could.  I have asked her to call Dr. Garlon Simpson should she have any questions, and if she finishes radiation and does not want to pursue the chemotherapy study I will just see her in  3 months from now.  She is fine with this plan.  She will call me or Dr. Garlon Simpson if she has any questions.    ______________________________ Jessica Simpson. Jessica Sleet, MD ESN/MEDQ  D:  02/01/2012  T:  02/01/2012  Job:  [id number]

## 2012-02-08 ENCOUNTER — Telehealth: Payer: Self-pay | Admitting: Oncology

## 2012-02-10 ENCOUNTER — Ambulatory Visit (HOSPITAL_COMMUNITY)
Admission: RE | Admit: 2012-02-10 | Discharge: 2012-02-10 | Disposition: A | Discharge status: Home / Self Care | Payer: 59 | Source: Ambulatory Visit | Attending: Oncology | Admitting: Oncology

## 2012-02-10 DIAGNOSIS — I972 Postmastectomy lymphedema syndrome: Secondary | ICD-10-CM | POA: Insufficient documentation

## 2012-02-10 DIAGNOSIS — IMO0001 Reserved for inherently not codable concepts without codable children: Secondary | ICD-10-CM | POA: Insufficient documentation

## 2012-02-10 NOTE — Evaluation (Signed)
Physical Therapy Evaluation  Patient Details  Name: Jessica Simpson MRN: [id number] Date of Birth: 06-01-1971  Today's Date: 02/10/2012 Time: 1115-1200 Time Calculation (min): 45 min  Visit#: 1  of 12   Re-eval: 03/11/12 Assessment Diagnosis: lymphedema L UE Surgical Date: 12/21/11 Prior Therapy: none  Past Medical History:  Past Medical History  Diagnosis Date  . BRCA1 negative 10/22/2011  . BRCA2 negative 10/22/2011  . Breast cancer, IDC, Left, Stage III, Triple negative 06/30/2011  . Hyperthyroidism     being checked for thyroid cyst   Past Surgical History:  Past Surgical History  Procedure Date  . Removal breast mass 2004    benign  . Left breast biopsy with axillary biopsy     core bx  . Portacath placement 08/02/2011    dr Jamey Ripa  . Tumor removal     on left hand  . Tumor removal     rt. eye  . Anal ascess     turned into a fistula with extensive treatment  . Breast surgery   . Incision and drainage abscess anal     x3  . Port-a-cath removal 12/21/2011    Procedure: REMOVAL PORT-A-CATH;  Surgeon: Currie Paris, MD;  Location: [facility];  Service: General;  Laterality: N/A;  . Evacuation breast hematoma 12/21/2011    Procedure: EVACUATION HEMATOMA BREAST;  Surgeon: Almond Lint, MD;  Location: [facility] OR;  Service: General;  Laterality: Left;  . Evacuation breast hematoma 12/21/2011    Procedure: EVACUATION HEMATOMA BREAST;  Surgeon: Almond Lint, MD;  Location: [facility] OR;  Service: General;  Laterality: Left;  . Irrigation and debridement hematoma 12/21/2011    Procedure: IRRIGATION AND DEBRIDEMENT HEMATOMA;  Surgeon: Almond Lint, MD;  Location: [facility] OR;  Service: General;  Laterality: Left;    Subjective Symptoms/Limitations Symptoms: Ms. Carolyn Stare was diagnosed with L breast cancer in October of 2012.  She underwent chemo and then had a partial masectomy with lymph node disection on 2/26.  A hemotoma developed  and was evacuated that day.  The  patient is now going through radiation therapy and should be done in May.  The patient states she was doing well until 01/23/12 when she went to put her watch on and noticed that due to increased swelling that prevented her from being able to put her watch on.  She has now been referred to physical therapy for lymphmassage, and/or  wrapping.   Pain Assessment Currently in Pain?: No/denies   Prior Functioning  Prior Function Vocation: Full time employment Vocation Requirements: typing/writing Leisure: Hobbies-yes (Comment) Comments: aerobics     Sensation/Coordination/Flexibility/Functional Tests Functional Tests Functional Tests: L UE 9647.19 cc; R 8848.45 cc  Assessment LUE AROM (degrees) Overall AROM Left Upper Extremity:  (overall ROM is wfl)  Physical Therapy Assessment and Plan PT Assessment and Plan Clinical Impression Statement: Pt with lymphedema of L hand with an increase of 798.74 cc volume who will benefit from skilled PT of lymph massage to decrease volume of UE> Rehab Potential: Good PT Frequency: Min 3X/week PT Duration: 4 weeks PT Plan: continue with education on self-massage and the importance of a sleeve.  IF swelling is not significantly down in a week may consider wrapping with foam and shor-stretch bandages.    Goals PT Short Term Goals Time to Complete Short Term Goals: 2 weeks PT Short Term Goal 1: pt to be able to verbalize precautions for decreasing the risk of infection. PT Short Term Goal 2: reduce volume of  edema by 50% PT Short Term Goal 3: Pt to recognize warning signs and appropriat eresponse to possible infection. PT Long Term Goals Time to Complete Long Term Goals: 4 weeks PT Long Term Goal 2: volume reduction by 80% Long Term Goal 3: verbalize understanding of where and how to be fitted for a compression garment.  Problem List Patient Active Problem List  Diagnoses  . Breast cancer, IDC, Left, Stage III, Triple negative  .  Chemotherapy-induced neutropenia  . BRCA1 negative  . BRCA2 negative    PT - End of Session Activity Tolerance: Patient tolerated treatment well General Behavior During Session: [facility] for tasks performed Cognition: [facility] for tasks performed PT Plan of Care Consulted and Agree with Plan of Care: Patient    Taquilla Downum,CINDY 02/10/2012, 12:39 PM  Physician Documentation Your signature is required to indicate approval of the treatment plan as stated above.  Please sign and either send electronically or make a copy of this report for your files and return this physician signed original.   Please mark one 1.__approve of plan  2. ___approve of plan with the following conditions.   ______________________________                                                          _____________________ Physician Signature                                                                                                             Date

## 2012-02-14 ENCOUNTER — Ambulatory Visit (HOSPITAL_COMMUNITY): Payer: 59 | Admitting: Physical Therapy

## 2012-02-14 ENCOUNTER — Ambulatory Visit (HOSPITAL_COMMUNITY)
Admission: RE | Admit: 2012-02-14 | Discharge: 2012-02-14 | Disposition: A | Discharge status: Home / Self Care | Payer: 59 | Source: Ambulatory Visit

## 2012-02-14 NOTE — Progress Notes (Signed)
Physical Therapy Lymphedema Treatment Patient Details  Name: Jessica Simpson MRN: [id number] Date of Birth: 1971/07/02  Today's Date: 02/14/2012 Time: 0850-0920 Time Calculation (min): 30 min Visit#: 2  of 12   Re-eval: 03/10/12 Charges:  Manual 30'    Subjective: Symptoms/Limitations Symptoms: Pt. states her L hand feels tight today but is not hurting. Pain Assessment Currently in Pain?: No/denies  Objective: Manual Therapy Manual Therapy: Other (comment) Other Manual Therapy: Manual Lymph Drainage to L UE to R axillary and L inguinal nodes  Physical Therapy Assessment and Plan PT Assessment and Plan Clinical Impression Statement: Noted tightness in L UE with hand being most affected.  Will measure at end of week and determine if bandaging needed; Recommended pt. get measured for garment X 2 weeks when fluid is reduced. PT Plan: Continue MLD, measure at end of this week.     Problem List Patient Active Problem List  Diagnoses  . Breast cancer, IDC, Left, Stage III, Triple negative  . Chemotherapy-induced neutropenia  . BRCA1 negative  . BRCA2 negative    PT - End of Session Activity Tolerance: Patient tolerated treatment well General Behavior During Session: [facility] for tasks performed Cognition: [facility] for tasks performed    Raife Lizer B. Bascom Levels, PTA / CLT 02/14/2012, 9:34 AM

## 2012-02-16 ENCOUNTER — Ambulatory Visit (HOSPITAL_COMMUNITY): Payer: 59 | Admitting: Physical Therapy

## 2012-02-16 ENCOUNTER — Ambulatory Visit (HOSPITAL_COMMUNITY)
Admission: RE | Admit: 2012-02-16 | Discharge: 2012-02-16 | Disposition: A | Discharge status: Home / Self Care | Payer: 59 | Source: Ambulatory Visit

## 2012-02-16 NOTE — Progress Notes (Signed)
Physical Therapy Lymphedema Treatment Patient Details  Name: Jessica Simpson MRN: [id number] Date of Birth: [birth year]  Today's Date: 02/16/2012 Time: 4098-1191 Time Calculation (min): 46 min Visit#: 3  of 12   Re-eval: 03/10/12 Charges:  Manual 42'    Subjective: Symptoms/Limitations Symptoms: Pt. reports she believes she lost some fluid after last visit. Pain Assessment Currently in Pain?: No/denies   Objective: Manual Therapy Manual Therapy: Other (comment) Other Manual Therapy: Manual Lymph Drainage to L UE to R axillary and L inguinal nodes   Physical Therapy Assessment and Plan PT Assessment and Plan Clinical Impression Statement: Less tightness/fullness noted in L UE today.  Pt. tolerating MLD well. PT Plan: Measure next visit to assess fluid loss.    Problem List Patient Active Problem List  Diagnoses  . Breast cancer, IDC, Left, Stage III, Triple negative  . Chemotherapy-induced neutropenia  . BRCA1 negative  . BRCA2 negative    PT - End of Session Activity Tolerance: Patient tolerated treatment well General Behavior During Session: [facility] for tasks performed Cognition: [facility] for tasks performed  Darothy Courtright B. Bascom Levels, PTA Assunta Curtis 02/16/2012, 11:32 AM

## 2012-02-17 ENCOUNTER — Ambulatory Visit (HOSPITAL_COMMUNITY)
Admission: RE | Admit: 2012-02-17 | Discharge: 2012-02-17 | Disposition: A | Discharge status: Home / Self Care | Payer: 59 | Source: Ambulatory Visit | Attending: Oncology | Admitting: Oncology

## 2012-02-17 ENCOUNTER — Ambulatory Visit (HOSPITAL_COMMUNITY): Payer: 59 | Admitting: Physical Therapy

## 2012-02-17 NOTE — Progress Notes (Signed)
Physical Therapy Lymphedema Treatment Patient Details  Name: Jessica Simpson MRN: [id number] Date of Birth: [birth year]  Today's Date: 02/17/2012 Time: 7829-5621 Time Calculation (min): 50 min Visit#: 4  of 12   Re-eval: 03/10/12 Charges:  Manual 40'    Subjective: Symptoms/Limitations Symptoms: Pt. was late for appt. due to running over with radiation treatment.  Pt. states she voided alot yesterday.  L UE re-measured today. Pain Assessment Currently in Pain?: No/denies   Objective: Manual Therapy Other Manual Therapy: Manual Lymph Drainage to L UE to R axillary and L inguinal nodes   Physical Therapy Assessment and Plan PT Assessment and Plan Clinical Impression Statement: Pt. re-measured today.  Total volume L UE: 3200.8cc, R: 2971.7cc.  Pt. now with 229.1cc fluid difference (was 798.7cc) with  overall fluid reduction of   569.6cc in 1 week of treatment.   Pt. is responding well.  PT Plan: Continue MLD.     Problem List Patient Active Problem List  Diagnoses  . Breast cancer, IDC, Left, Stage III, Triple negative  . Chemotherapy-induced neutropenia  . BRCA1 negative  . BRCA2 negative    PT - End of Session Activity Tolerance: Patient tolerated treatment well General Behavior During Session: [facility] for tasks performed Cognition: [facility] for tasks performed   Tyniya Kuyper B. Bascom Levels, PTA / CLT 02/17/2012, 6:06 PM

## 2012-02-22 ENCOUNTER — Ambulatory Visit (HOSPITAL_COMMUNITY): Payer: 59 | Admitting: Physical Therapy

## 2012-02-24 ENCOUNTER — Ambulatory Visit (HOSPITAL_COMMUNITY)
Admission: RE | Admit: 2012-02-24 | Discharge: 2012-02-24 | Disposition: A | Discharge status: Home / Self Care | Payer: 59 | Source: Ambulatory Visit | Attending: Oncology | Admitting: Oncology

## 2012-02-24 DIAGNOSIS — I972 Postmastectomy lymphedema syndrome: Secondary | ICD-10-CM | POA: Insufficient documentation

## 2012-02-24 DIAGNOSIS — IMO0001 Reserved for inherently not codable concepts without codable children: Secondary | ICD-10-CM | POA: Insufficient documentation

## 2012-02-24 NOTE — Progress Notes (Signed)
Physical Therapy Lymphedema Treatment Patient Details  Name: Jessica Simpson MRN: [id number] Date of Birth: 08/18/1971  Today's Date: 02/24/2012 Time: 4098-1191 PT Time Calculation (min): 42 min Visit#: 5  of 12   Re-eval: 03/10/12 Charges:  Manual 40'     Subjective: Symptoms/Limitations Symptoms: Pt. reports she came 1 hour late for her last appt. due to running over with radiation and was not able to be worked into the schedule, therefore she has only had one treatment this week.  Currently without pain; has not looked into a compression garment yet. Pain Assessment Currently in Pain?: No/denies  Objective: Manual Therapy Other Manual Therapy: Manual Lymph Drainage to L UE to R axillary and L inguinal nodes  Physical Therapy Assessment and Plan PT Assessment and Plan Clinical Impression Statement: Pt. with noted increase of fluid today in L UE, tightness/heaviness of skin.  Encouraged pt. to get fitted for her sleeve to complement her therapy/keep fluid from returning. PT Plan: Continue MLD; measure next week.     Problem List Patient Active Problem List  Diagnoses  . Breast cancer, IDC, Left, Stage III, Triple negative  . Chemotherapy-induced neutropenia  . BRCA1 negative  . BRCA2 negative    PT - End of Session Activity Tolerance: Patient tolerated treatment well General Behavior During Session: [facility] for tasks performed Cognition: [facility] for tasks performed   Carsin Randazzo B. Bascom Levels, PTA/CLT 02/24/2012, 6:03 PM

## 2012-03-01 ENCOUNTER — Ambulatory Visit (HOSPITAL_COMMUNITY)
Admission: RE | Admit: 2012-03-01 | Discharge: 2012-03-01 | Disposition: A | Discharge status: Home / Self Care | Payer: 59 | Source: Ambulatory Visit | Attending: Oncology | Admitting: Oncology

## 2012-03-01 NOTE — Progress Notes (Signed)
Physical Therapy Treatment Patient Details  Name: Jessica Simpson MRN: [id number] Date of Birth: 09-14-1971  Today's Date: 03/01/2012 Time: 4098-1191 PT Time Calculation (min): 45 min  Visit#: 6  of 12   Re-eval: 03/10/12     Subjective: Symptoms/Limitations Symptoms: Pt. was late for appt; states she is now finished with radiation treatments.  States she has burnt, tender skin around her L breast but otherwise is not hurting today.  Pt. given information on businesses to purchase her compression sleeve from. Pain Assessment Currently in Pain?: No/denies   Objective: Pt. remeasured today:   Manual Therapy Other Manual Therapy: Manual Lymph Drainage to L UE to R axillary and L inguinal nodes   Physical Therapy Assessment and Plan PT Assessment and Plan Clinical Impression Statement: Pt. has been unable to make but 1 appt. per week X past 2 weeks, however has still lost an additional 106.98cc as measured today.   Pt. instructed to get fitted for her sleeve by next week.  PT Plan: Continue MLD; re-evaluate next week.     Problem List Patient Active Problem List  Diagnoses  . Breast cancer, IDC, Left, Stage III, Triple negative  . Chemotherapy-induced neutropenia  . BRCA1 negative  . BRCA2 negative    PT - End of Session Activity Tolerance: Patient tolerated treatment well General Behavior During Session: [facility] for tasks performed Cognition: [facility] for tasks performed   Donita Newland B. Bascom Levels, PTA 03/01/2012, 5:31 PM

## 2012-03-02 ENCOUNTER — Encounter: Payer: Self-pay | Admitting: Oncology

## 2012-03-02 NOTE — Progress Notes (Signed)
Patient approve for 70% Discount for a family of two, 03/02/12 - 09/02/12

## 2012-03-07 ENCOUNTER — Other Ambulatory Visit (HOSPITAL_COMMUNITY): Payer: Self-pay | Admitting: Oncology

## 2012-03-07 ENCOUNTER — Telehealth (HOSPITAL_COMMUNITY): Payer: Self-pay | Admitting: Physical Therapy

## 2012-03-07 ENCOUNTER — Ambulatory Visit (HOSPITAL_COMMUNITY)
Admission: RE | Admit: 2012-03-07 | Discharge: 2012-03-07 | Disposition: A | Discharge status: Home / Self Care | Payer: 59 | Source: Ambulatory Visit

## 2012-03-07 DIAGNOSIS — C50912 Malignant neoplasm of unspecified site of left female breast: Secondary | ICD-10-CM

## 2012-03-07 MED ORDER — OXYCODONE-ACETAMINOPHEN 5-325 MG PO TABS: 1.0000 | ORAL_TABLET | Freq: Four times a day (QID) | ORAL | Status: AC | PRN

## 2012-03-07 NOTE — Progress Notes (Signed)
Physical Therapy Lymphedema Treatment Patient Details  Name: Jessica Simpson MRN: [id number] Date of Birth: 06-16-1971  Today's Date: 03/07/2012 Time: 4098-1191 PT Time Calculation (min): 43 min Visit#: 7  of 12   Re-eval: 03/10/12 Charges: manual 40'    Subjective: Symptoms/Limitations Symptoms: Pt. states she has not had time to get her sleeve; states she's going to try to get it tomorrow.  Pt. currently without pain and no visible swelling today into L hand. Pain Assessment Currently in Pain?: No/denies   Objective: Manual Therapy Other Manual Therapy: Manual Lymph Drainage to L UE to R axillary and L inguinal nodes   Physical Therapy Assessment and Plan PT Assessment and Plan Clinical Impression Statement: L UE continues to respond well to treatment.  Pt. with 2 visits remaining and will need education on properly donning/doffing sleeve and general home maintenance information.   PT Plan: Continue MLD X 2 more weeks then re-evaluate.     Problem List Patient Active Problem List  Diagnoses  . Breast cancer, IDC, Left, Stage III, Triple negative  . Chemotherapy-induced neutropenia  . BRCA1 negative  . BRCA2 negative    PT - End of Session Activity Tolerance: Patient tolerated treatment well General Behavior During Session: [facility] for tasks performed Cognition: [facility] for tasks performed   Deagan Sevin B. Bascom Levels, PTA / CLT 03/07/2012, 5:22 PM

## 2012-03-08 ENCOUNTER — Ambulatory Visit (HOSPITAL_COMMUNITY)
Admission: RE | Admit: 2012-03-08 | Discharge: 2012-03-08 | Disposition: A | Discharge status: Home / Self Care | Payer: 59 | Source: Ambulatory Visit | Attending: Oncology | Admitting: Oncology

## 2012-03-08 NOTE — Progress Notes (Signed)
Physical Therapy Lymphedema Treatment Patient Details  Name: Jessica Simpson MRN: [id number] Date of Birth: Jul 20, 1971  Today's Date: 03/08/2012 Time: 8295-6213 PT Time Calculation (min): 39 min  Visit#: 8  of 12   Re-eval: 03/10/12 Charges:  Manual 38'    Subjective: Symptoms/Limitations Symptoms: Pt. reports she's doing good; hopefully checking on a sleeve today. Pain Assessment Currently in Pain?: No/denies   Objective: Manual Therapy Other Manual Therapy: Manual Lymph Drainage to L UE to R axillary and L inguinal nodes   Physical Therapy Assessment and Plan PT Assessment and Plan Clinical Impression Statement: Very little swelling present today, to educated on garment if able to purchase next visit. PT Plan: Re-eval next visit.     Problem List Patient Active Problem List  Diagnoses  . Breast cancer, IDC, Left, Stage III, Triple negative  . Chemotherapy-induced neutropenia  . BRCA1 negative  . BRCA2 negative    PT - End of Session Activity Tolerance: Patient tolerated treatment well General Behavior During Session: [facility] for tasks performed Cognition: [facility] for tasks performed   Jessica Simpson B. Bascom Levels, PTA / CLT 03/08/2012, 12:05 PM

## 2012-03-09 ENCOUNTER — Ambulatory Visit (HOSPITAL_COMMUNITY): Payer: 59 | Admitting: Physical Therapy

## 2012-03-10 ENCOUNTER — Other Ambulatory Visit (HOSPITAL_COMMUNITY): Payer: Self-pay | Admitting: Oncology

## 2012-03-10 DIAGNOSIS — C50912 Malignant neoplasm of unspecified site of left female breast: Secondary | ICD-10-CM

## 2012-03-16 ENCOUNTER — Encounter (INDEPENDENT_AMBULATORY_CARE_PROVIDER_SITE_OTHER): Payer: Self-pay

## 2012-03-22 ENCOUNTER — Ambulatory Visit (HOSPITAL_COMMUNITY)
Admission: RE | Admit: 2012-03-22 | Discharge: 2012-03-22 | Disposition: A | Discharge status: Home / Self Care | Payer: 59 | Source: Ambulatory Visit | Attending: Oncology | Admitting: Oncology

## 2012-03-22 NOTE — Progress Notes (Signed)
Physical Therapy Lymphedema Treatment Patient Details  Name: Jessica Simpson MRN: [id number] Date of Birth: 1971-09-03  Today's Date: 03/22/2012 Time: 1120-1204 PT Time Calculation (min): 44 min  Visit#: 9  of 16   Re-eval: 03/31/12 Charges:  Manual 40'    Subjective: Symptoms/Limitations Symptoms: Pt. has missed nearly 2 weeks of treatment and comes today with increased swelling noted in L UE, especially dorsum of L hand and forearm.  Pt. reports she got a sleeve and gauntlet, however did not wear it this morning when she was walking and that has caused her UE to swell.  Pt. did not bring her garments with her today. Pain Assessment Currently in Pain?: No/denies  Objective: Manual Therapy Other Manual Therapy: Manual Lymph Drainage to L UE to R axillary and L inguinal nodes .  Measurements also taken today.  Physical Therapy Assessment and Plan PT Assessment and Plan Clinical Impression Statement: L UE with 22.10cc more fluid as measured 2 weeks prior, however still down 84.79cc from measurement on 02/17/12 initial evaluation.  Pt. with garments, however forgot to bring them today.  Discussed importance of full compliance with garments to keep fluid down and also importance of exercise and aquatic activity. PT Plan: Continue X 2 more weeks (2x week) to ensure independence with self massage and garments for L UE.     Problem List Patient Active Problem List  Diagnoses  . Breast cancer, IDC, Left, Stage III, Triple negative  . Chemotherapy-induced neutropenia  . BRCA1 negative  . BRCA2 negative    PT - End of Session Activity Tolerance: Patient tolerated treatment well General Behavior During Session: [facility] for tasks performed Cognition: [facility] for tasks performed   Lurena Nida, PTA/CLT 03/22/2012, 12:20 PM

## 2012-03-23 ENCOUNTER — Ambulatory Visit (HOSPITAL_COMMUNITY)
Admission: RE | Admit: 2012-03-23 | Discharge: 2012-03-23 | Disposition: A | Discharge status: Home / Self Care | Payer: 59 | Source: Ambulatory Visit | Attending: Oncology | Admitting: Oncology

## 2012-03-23 NOTE — Progress Notes (Signed)
Physical Therapy Lymphedema Treatment Patient Details  Name: Jessica Simpson MRN: [id number] Date of Birth: 11/24/1970  Today's Date: 03/23/2012 Time: 1024-1105 PT Time Calculation (min): 41 min Visit#: 10  of 16   Re-eval: 03/30/12 Charges:  Manual 38'   Subjective: Symptoms/Limitations Symptoms: Pt. states she urinated alot yesterday evening.  Pt. reports she walked this morning; is wearing her compression garment today.  Objective: Manual Therapy Other Manual Therapy: Manual Lymph Drainage to L UE to R axillary and L inguinal nodes   Physical Therapy Assessment and Plan PT Assessment and Plan Clinical Impression Statement: Pt. able to don/doff compression garment independently.  Concerned with fit of sleeve as is too long for her arm requiring her to fold it down at the top and gauntlet does not include fingers.  Sleeve also is 15-81mmHg and believe pt would benefit from higher compression class, 20-72mmHg.  Pt. told therapist to check on sleeves. PT Plan: Continue X 2 more visits; check into better fitting garment for patient.     Problem List Patient Active Problem List  Diagnoses  . Breast cancer, IDC, Left, Stage III, Triple negative  . Chemotherapy-induced neutropenia  . BRCA1 negative  . BRCA2 negative    PT - End of Session Activity Tolerance: Patient tolerated treatment well General Behavior During Session: [facility] for tasks performed Cognition: [facility] for tasks performed  Lurena Nida, PTA/CLT 03/23/2012, 11:15 AM

## 2012-03-24 ENCOUNTER — Ambulatory Visit (INDEPENDENT_AMBULATORY_CARE_PROVIDER_SITE_OTHER): Payer: 59 | Admitting: Surgery

## 2012-03-24 ENCOUNTER — Encounter (INDEPENDENT_AMBULATORY_CARE_PROVIDER_SITE_OTHER): Payer: Self-pay | Admitting: Surgery

## 2012-03-24 VITALS — BP 136/88 | HR 80 | Temp 97.4°F | Resp 16 | Ht 65.0 in | Wt 208.6 lb

## 2012-03-24 DIAGNOSIS — C50919 Malignant neoplasm of unspecified site of unspecified female breast: Secondary | ICD-10-CM

## 2012-03-24 DIAGNOSIS — C50912 Malignant neoplasm of unspecified site of left female breast: Secondary | ICD-10-CM

## 2012-03-24 NOTE — Progress Notes (Signed)
Chief complaint: Followup after post lobectomy radiation therapy  History of present illness: The patient underwent lumpectomy in February after neoadjuvant chemotherapy. She is now completed her radiation therapy. We will get a baseline exam. Having any other problems with her breasts. She has been wearing a sleeve for lymphedema and forgot to where it developed some lymphedema and is now undergoing some physical therapy for that.  Exam: Vital signs: Gen.: The patient were correct healthy-appearing Breasts: Right breast is unremarkable. The left breast shows some mild radiation changes. There is a mass like effect at the lumpectomy site which is postsurgical changes. There is no other evidence of a problem. Lymphatics: No mass in the axilla. Extremities: Very mild left lymphedema  Impression: A well status post lumpectomy radiation therapy with mild lymphedema undergoing physical therapy.  Plan: She will continue her lymphedema therapy. I then sat of work until the lymphedema is improved. I plan to see her back in about four months.

## 2012-03-28 ENCOUNTER — Other Ambulatory Visit: Payer: Self-pay | Admitting: Radiation Oncology

## 2012-03-28 ENCOUNTER — Other Ambulatory Visit (HOSPITAL_COMMUNITY): Payer: Self-pay | Admitting: Oncology

## 2012-03-28 ENCOUNTER — Ambulatory Visit (HOSPITAL_COMMUNITY): Payer: 59 | Admitting: Physical Therapy

## 2012-03-28 DIAGNOSIS — C50912 Malignant neoplasm of unspecified site of left female breast: Secondary | ICD-10-CM

## 2012-03-29 ENCOUNTER — Ambulatory Visit (HOSPITAL_COMMUNITY)
Admission: RE | Admit: 2012-03-29 | Discharge: 2012-03-29 | Disposition: A | Discharge status: Home / Self Care | Payer: 59 | Source: Ambulatory Visit | Attending: Oncology | Admitting: Oncology

## 2012-03-29 DIAGNOSIS — IMO0001 Reserved for inherently not codable concepts without codable children: Secondary | ICD-10-CM | POA: Insufficient documentation

## 2012-03-29 DIAGNOSIS — I972 Postmastectomy lymphedema syndrome: Secondary | ICD-10-CM | POA: Insufficient documentation

## 2012-03-29 NOTE — Progress Notes (Signed)
Physical Therapy Lymphedema Treatment Patient Details  Name: SHANNIN NAAB MRN: [id number] Date of Birth: 06-12-1971  Today's Date: 03/29/2012 Time: 1120-1200 PT Time Calculation (min): 40 min Visit#: 11  of 16   Re-eval: 03/30/12 Charges:  Manual 40'   Subjective: Symptoms/Limitations Symptoms: Pt. states her arm is swollen some mornings; unsure if she is sleeping on it or not.  Suggested night sleep aids (reid sleeve), however, pt. is not interested in wearing something at night. Pain Assessment Currently in Pain?: No/denies  Objective: Manual Therapy Other Manual Therapy: Manual Lymph Drainage to L UE to R axillary and L inguinal nodes   Physical Therapy Assessment and Plan PT Assessment and Plan Clinical Impression Statement: Noted increased swelling on dorsal surface of hand today; Pt. was wearing her sleeve but not her gauntlet.  Pt. given information on ordering a compression garment/gauntlet through [email or web address].   PT Plan: Re-evaluate next visit.     Problem List Patient Active Problem List  Diagnoses  . Breast cancer, IDC, Left, Stage III, Triple negative  . Chemotherapy-induced neutropenia  . BRCA1 negative  . BRCA2 negative    PT - End of Session Activity Tolerance: Patient tolerated treatment well General Behavior During Session: [name] for tasks performed Cognition: [facility] for tasks performed  Lurena Nida, PTA/CLT 03/29/2012, 12:14 PM

## 2012-03-30 ENCOUNTER — Ambulatory Visit (HOSPITAL_COMMUNITY)
Admission: RE | Admit: 2012-03-30 | Discharge: 2012-03-30 | Disposition: A | Discharge status: Home / Self Care | Payer: 59 | Source: Ambulatory Visit | Attending: Oncology | Admitting: Oncology

## 2012-03-30 NOTE — Progress Notes (Signed)
Physical Therapy Lymphedema Treatment Patient Details  Name: Jessica Simpson MRN: [id number] Date of Birth: 04-Sep-1971  Today's Date: 03/30/2012 Time: 1118-1200 PT Time Calculation (min): 42 min Charges:  Manual 40' Visit#: 12  of 16   Re-eval: 03/30/12   Subjective: Symptoms/Limitations Symptoms: Pt. has no complaints today.  States she continues to walk a mile daily and is independent with her compression garments for L UE. Pain Assessment Currently in Pain?: No/denies  Objective: Manual Therapy Other Manual Therapy: Manual Lymph Drainage to L UE to R axillary and L inguinal nodes   Physical Therapy Assessment and Plan PT Assessment and Plan Clinical Impression Statement: Pt. with overall 33% lymph reduction in L UE.  Pt. has met all goals except fluid volume reduction.  Current volume is 3,126.10cc in L U and is 2971.71cc in R UE.  Pt. with 154.39cc difference between UE's.  Lymph removal has slowed at this point and pt. will be on self maintenance program.  Pt. is independent with compression garment and self massage. PT Plan: Recommend discharge to self-maintenance program.      Goals PT Short Term Goals Time to Complete Short Term Goals: 2 weeks PT Short Term Goal 1: pt to be able to verbalize precautions for decreasing the risk of infection. PT Short Term Goal 1 - Progress: Met PT Short Term Goal 2: reduce volume of edema by 50% PT Short Term Goal 2 - Progress: Not met (edema reduced by 33%) PT Short Term Goal 3: Pt to recognize warning signs and appropriat eresponse to possible infection. PT Short Term Goal 3 - Progress: Met PT Long Term Goals Time to Complete Long Term Goals: 4 weeks PT Long Term Goal 2: volume reduction by 80% PT Long Term Goal 2 - Progress: Not met Long Term Goal 3: verbalize understanding of where and how to be fitted for a compression garment. Long Term Goal 3 Progress: Met  Problem List Patient Active Problem List  Diagnoses  . Breast  cancer, IDC, Left, Stage III, Triple negative  . Chemotherapy-induced neutropenia  . BRCA1 negative  . BRCA2 negative    PT - End of Session Activity Tolerance: Patient tolerated treatment well General Behavior During Session: [facility] for tasks performed Cognition: [facility] for tasks performed   Lurena Nida, PTA/CLT 03/30/2012, 4:29 PM

## 2012-04-07 ENCOUNTER — Other Ambulatory Visit (HOSPITAL_COMMUNITY): Payer: Self-pay | Admitting: Oncology

## 2012-04-07 DIAGNOSIS — Z853 Personal history of malignant neoplasm of breast: Secondary | ICD-10-CM

## 2012-04-07 DIAGNOSIS — Z9889 Other specified postprocedural states: Secondary | ICD-10-CM

## 2012-05-05 ENCOUNTER — Encounter (HOSPITAL_COMMUNITY): Payer: 59 | Attending: Oncology | Admitting: Oncology

## 2012-05-05 VITALS — BP 168/94 | HR 79 | Temp 97.7°F | Ht 65.0 in | Wt 211.0 lb

## 2012-05-05 DIAGNOSIS — J329 Chronic sinusitis, unspecified: Secondary | ICD-10-CM

## 2012-05-05 DIAGNOSIS — C50912 Malignant neoplasm of unspecified site of left female breast: Secondary | ICD-10-CM

## 2012-05-05 DIAGNOSIS — G609 Hereditary and idiopathic neuropathy, unspecified: Secondary | ICD-10-CM

## 2012-05-05 DIAGNOSIS — C50919 Malignant neoplasm of unspecified site of unspecified female breast: Secondary | ICD-10-CM | POA: Insufficient documentation

## 2012-05-05 DIAGNOSIS — C50419 Malignant neoplasm of upper-outer quadrant of unspecified female breast: Secondary | ICD-10-CM

## 2012-05-05 DIAGNOSIS — C773 Secondary and unspecified malignant neoplasm of axilla and upper limb lymph nodes: Secondary | ICD-10-CM

## 2012-05-05 MED ORDER — FLUTICASONE PROPIONATE 50 MCG/ACT NA SUSP: NASAL | Status: DC

## 2012-05-05 MED ORDER — SULFAMETHOXAZOLE-TMP DS 800-160 MG PO TABS: 1.0000 | ORAL_TABLET | Freq: Two times a day (BID) | ORAL | Status: AC

## 2012-05-05 NOTE — Progress Notes (Signed)
Problem #1 stage III triple negative breast cancer left-sided with a 7-7.6 cm mass at presentation upper outer quadrant palpable mass also left axilla looks quite large treated with dose dense FEC followed by dose dense Taxotere. She had 4 cycles of FEC completed on 09/14/2011 and 4 cycles of dose dense Taxotere completed on 11/23/2011. She then went on have surgery by Dr. Jamey Ripa on 12/21/2011 still found to have a 1 cm high-grade cancer with 3 of 7 positive nodes. LV I was not seen. There was a macro metastasis that was 1 cm one lymph node 4 mm another lymph node and isolated tumor cells and a third node. Extracapsular extension was found and she finished radiation therapy recently.  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  Peripheral neuropathy grade 1 presently much improved off gabapentin.  Sky's only real complaint today is stopping his liver knows interface around the ethmoids and yellow discharge intermittently for the last week or 2 which is quite bothersome. For this I will recommend Flonase and an antibiotic.  She also complains of tenderness in the upper portion of the breast for local clavicular for the last few days to a week without trauma though she may have rubbed this area possibly too hard while doing her massage therapy for the lymphedema  Vital signs are stable her left arm has a sleeve and mild edema at most presently there is no palpable abnormality in the left breast scar is well-healed there is pigmentation in the left breast more pronounced near the scar. The right breast is negative she has no adenopathy in any location including cervical supraclavicular infraclavicular axillary and inguinal areas. Abdomen soft and nontender obese but without organomegaly or masses. Bowel sounds are present. Lungs are clear. Heart shows a regular rhythm and  rate without murmur or gallop. 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 She has no leg edema.  She looks good feels very good and the neuropathy is getting much better she states. She is having fewer hot flashes and may have early menstrual cramping she thinks. I don't doubt this since I  suspect her ovarian function will return  She has mammography due in September that is already scheduled. I will see her in October. We will do blood work in October

## 2012-05-05 NOTE — Progress Notes (Signed)
The above should read: is stuffiness of her nose and face pain in her ethmoidal sinus area.Marland KitchenMarland Kitchen

## 2012-05-05 NOTE — Patient Instructions (Addendum)
Jessica Simpson  [id number] [birth year] Dr. Glenford Peers   [facility]  Discharge Instructions  RECOMMENDATIONS MADE BY THE CONSULTANT AND ANY TEST RESULTS WILL BE SENT TO YOUR REFERRING DOCTOR.   EXAM FINDINGS BY MD TODAY AND SIGNS AND SYMPTOMS TO REPORT TO CLINIC OR PRIMARY MD: You look great.  Report any new lumps, bone pain or shortness of breath.  MEDICATIONS PRESCRIBED: Flonase 2 sprays each nostril daily for 14 days then daily as needed. Bactrim DS take 1 twice daily for 7 days. Follow label directions  INSTRUCTIONS GIVEN AND DISCUSSED:   SPECIAL INSTRUCTIONS/FOLLOW-UP: Lab work Needed in 3 months and Return to Clinic for follow-up after labs.   I acknowledge that I have been informed and understand all the instructions given to me and received a copy. I do not have any more questions at this time, but understand that I may call the Specialty Clinic at [facility] at [phone] during business hours should I have any further questions or need assistance in obtaining follow-up care.    __________________________________________  _____________  __________ Signature of Patient or Authorized Representative            Date                   Time    __________________________________________ Nurse's Signature

## 2012-05-23 ENCOUNTER — Telehealth (HOSPITAL_COMMUNITY): Payer: Self-pay | Admitting: *Deleted

## 2012-05-23 ENCOUNTER — Other Ambulatory Visit (HOSPITAL_COMMUNITY): Payer: Self-pay | Admitting: Oncology

## 2012-05-23 DIAGNOSIS — C50912 Malignant neoplasm of unspecified site of left female breast: Secondary | ICD-10-CM

## 2012-05-23 MED ORDER — OXYCODONE-ACETAMINOPHEN 5-325 MG PO TABS: 1.0000 | ORAL_TABLET | ORAL | Status: DC | PRN

## 2012-05-23 NOTE — Telephone Encounter (Signed)
Patient not currently taking any pain medication but wants to know if she can have a prescription for pain that comes and goes in her breast and head. Something non drowsy. Upon her last visit, she forgot to get a prescription or ask about this. Can we give something to her?

## 2012-05-23 NOTE — Telephone Encounter (Signed)
Rx ready for her to pick up.  This is a month supply.  We cannot give her more than this.  If she continues to have issues, we will need to see her and evaluate this discomfort in more detail.

## 2012-05-24 ENCOUNTER — Telehealth (HOSPITAL_COMMUNITY): Payer: Self-pay | Admitting: *Deleted

## 2012-05-24 ENCOUNTER — Other Ambulatory Visit (HOSPITAL_COMMUNITY): Payer: Self-pay | Admitting: Oncology

## 2012-05-24 DIAGNOSIS — C50912 Malignant neoplasm of unspecified site of left female breast: Secondary | ICD-10-CM

## 2012-05-24 MED ORDER — TRAMADOL HCL 50 MG PO TABS: 50.0000 mg | ORAL_TABLET | Freq: Four times a day (QID) | ORAL | Status: AC | PRN

## 2012-05-24 NOTE — Telephone Encounter (Signed)
Call made to [facility] pharmacy regarding new rx for tramadol. Patient made aware via vm on cell phone that med had been called in.

## 2012-05-24 NOTE — Telephone Encounter (Signed)
Message left on answering machine that RX is ready for pick up.

## 2012-06-15 ENCOUNTER — Encounter (HOSPITAL_COMMUNITY): Payer: Self-pay | Admitting: Oncology

## 2012-06-15 DIAGNOSIS — I890 Lymphedema, not elsewhere classified: Secondary | ICD-10-CM | POA: Insufficient documentation

## 2012-06-15 HISTORY — DX: Lymphedema, not elsewhere classified: I89.0

## 2012-06-30 ENCOUNTER — Ambulatory Visit
Admission: RE | Admit: 2012-06-30 | Discharge: 2012-06-30 | Disposition: A | Discharge status: Home / Self Care | Payer: 59 | Source: Ambulatory Visit | Attending: Oncology | Admitting: Oncology

## 2012-06-30 DIAGNOSIS — Z853 Personal history of malignant neoplasm of breast: Secondary | ICD-10-CM

## 2012-06-30 DIAGNOSIS — Z9889 Other specified postprocedural states: Secondary | ICD-10-CM

## 2012-07-05 ENCOUNTER — Other Ambulatory Visit: Payer: Self-pay | Admitting: Adult Health

## 2012-07-05 ENCOUNTER — Other Ambulatory Visit (HOSPITAL_COMMUNITY)
Admission: RE | Admit: 2012-07-05 | Discharge: 2012-07-05 | Disposition: A | Discharge status: Home / Self Care | Payer: 59 | Source: Ambulatory Visit | Attending: Obstetrics and Gynecology | Admitting: Obstetrics and Gynecology

## 2012-07-05 DIAGNOSIS — Z1151 Encounter for screening for human papillomavirus (HPV): Secondary | ICD-10-CM | POA: Insufficient documentation

## 2012-07-05 DIAGNOSIS — Z01419 Encounter for gynecological examination (general) (routine) without abnormal findings: Secondary | ICD-10-CM | POA: Insufficient documentation

## 2012-07-06 ENCOUNTER — Encounter (HOSPITAL_COMMUNITY): Payer: Self-pay | Admitting: *Deleted

## 2012-07-06 NOTE — Progress Notes (Signed)
ICD 9 code 457.1 for lymphedema (however she needs this on paper) can you figure out what needs to be done and then i guess we need to print something and mail to her?! Thanks! This is intended for [employer]	[place].

## 2012-07-08 ENCOUNTER — Other Ambulatory Visit: Payer: Self-pay | Admitting: Oncology

## 2012-07-12 ENCOUNTER — Telehealth (HOSPITAL_COMMUNITY): Payer: Self-pay | Admitting: *Deleted

## 2012-07-12 NOTE — Telephone Encounter (Signed)
MSG left on patients vm that I have a rx in the mail with her ICD 9 code on it for lymphedema and also let her know that [facility] said that her menses will probably come back, hot flashes will probably go away, and to continue follow up care at her gyn office.

## 2012-07-12 NOTE — Telephone Encounter (Signed)
Patient wants to be sure that her menstrual cycle starting back after 1 year of being gone is normal (breast cancer patient). She had scant spotting for 3-4 days. Her hot flashes are fewer and farther between.

## 2012-08-09 ENCOUNTER — Other Ambulatory Visit (HOSPITAL_COMMUNITY): Payer: 59

## 2012-08-09 ENCOUNTER — Encounter (HOSPITAL_COMMUNITY): Payer: 59 | Attending: Oncology

## 2012-08-09 DIAGNOSIS — C773 Secondary and unspecified malignant neoplasm of axilla and upper limb lymph nodes: Secondary | ICD-10-CM

## 2012-08-09 DIAGNOSIS — C50419 Malignant neoplasm of upper-outer quadrant of unspecified female breast: Secondary | ICD-10-CM | POA: Insufficient documentation

## 2012-08-09 DIAGNOSIS — C50912 Malignant neoplasm of unspecified site of left female breast: Secondary | ICD-10-CM

## 2012-08-09 LAB — COMPREHENSIVE METABOLIC PANEL
ALT: 15 U/L (ref 0–35)
CO2: 25 mEq/L (ref 19–32)
Calcium: 9.5 mg/dL (ref 8.4–10.5)
Creatinine, Ser: 0.9 mg/dL (ref 0.50–1.10)
GFR calc Af Amer: >90 mL/min (ref >90)
GFR calc non Af Amer: 78 mL/min — ABNORMAL LOW (ref >90)
Glucose, Bld: 89 mg/dL (ref 70–99)
Total Bilirubin: 0.2 mg/dL — ABNORMAL LOW (ref 0.3–1.2)

## 2012-08-09 LAB — CBC
HCT: 38 % (ref 36.0–46.0)
Hemoglobin: 12.6 g/dL (ref 12.0–15.0)
MCV: 78 fL (ref 78.0–100.0)
RBC: 4.87 MIL/uL (ref 3.87–5.11)
WBC: 6.2 10*3/uL (ref 4.0–10.5)

## 2012-08-09 LAB — DIFFERENTIAL
Eosinophils Relative: 2 % (ref 0–5)
Lymphocytes Relative: 24 % (ref 12–46)
Lymphs Abs: 1.5 10*3/uL (ref 0.7–4.0)
Monocytes Absolute: 0.4 10*3/uL (ref 0.1–1.0)

## 2012-08-09 NOTE — Progress Notes (Signed)
Jessica Simpson presented for labwork. Labs per MD order drawn via Peripheral Line 23 gauge needle inserted in right AC  Good blood return present. Procedure without incident.  Needle removed intact. Patient tolerated procedure well.   

## 2012-08-11 ENCOUNTER — Ambulatory Visit (HOSPITAL_COMMUNITY): Payer: 59 | Admitting: Oncology

## 2012-08-15 ENCOUNTER — Encounter (HOSPITAL_BASED_OUTPATIENT_CLINIC_OR_DEPARTMENT_OTHER): Payer: 59 | Admitting: Oncology

## 2012-08-15 ENCOUNTER — Encounter (HOSPITAL_COMMUNITY): Payer: Self-pay | Admitting: Oncology

## 2012-08-15 VITALS — BP 152/85 | HR 73 | Temp 98.3°F | Resp 16 | Wt 219.4 lb

## 2012-08-15 DIAGNOSIS — C50419 Malignant neoplasm of upper-outer quadrant of unspecified female breast: Secondary | ICD-10-CM

## 2012-08-15 DIAGNOSIS — C773 Secondary and unspecified malignant neoplasm of axilla and upper limb lymph nodes: Secondary | ICD-10-CM

## 2012-08-15 DIAGNOSIS — C50912 Malignant neoplasm of unspecified site of left female breast: Secondary | ICD-10-CM

## 2012-08-15 DIAGNOSIS — F411 Generalized anxiety disorder: Secondary | ICD-10-CM

## 2012-08-15 NOTE — Progress Notes (Signed)
Problem #1 stage III triple negative breast cancer left-sided with a 7-7.6 cm mass at presentation upper outer quadrant palpably present as well as a mass in the left axilla. We treated her with dose dense FEC followed by dose dense Taxotere. She 4 cycles of FVC followed by 4 cycles of dose dense Taxotere all completed by 11/23/2011. She went on have surgery by Dr. Jamey Ripa on 02/18/2012 still found to have a 1 cm grade 3 cancer with 3 of 7 positive nodes. LV I was not seen. There was a macro metastasis and one lymph node that was 1 cm in size. Another lymph node had a 4 mm metastasis and she had isolated tumor cells and a third lymph node. Extracapsular extension was not seen she went on to finish with radiation therapy after surgery She is here today as a work in because of anxiety over one of her fellow workers who had stage III cancer the colon was found to have stage IV cancer the colon shortly after completion of chemotherapy and this has upset her very seriously. She is not aware of anything new or different on her physical exam or review of systems. Bowels are working well appetite is good energy is coming back. She had some lab work the other day which showed a nonspecific minimal elevation in her alkaline phosphatase only.  Her exam shows stable vital signs. She has no lymphadenopathy. She has clear lung fields. Her heart shows a regular rhythm and rate without murmur rub or gallop. The left breast has increased pigmentation to the skin but no masses palpable. The right breast is also negative. You'll Port-A-Cath site is intact. Abdomen is soft and nontender without hepatosplenomegaly. She has no peripheral edema except on the left arm it's just ever so slightly puffy and she is encouraged to wear her sleeve.  I will see her back in 12 weeks just to make sure she is okay I will repeat her blood work the day before. I tried to reassure her that with as much emotional trauma as she went through with this  cancer that this is very normal to be upset if a  friend or coworker comes down with stage IV disease. She thinks she is fine as far as sleeping at night etc. she will see [place] sooner if anything changes

## 2012-08-15 NOTE — Patient Instructions (Addendum)
[facility]  Discharge Instructions  RECOMMENDATIONS MADE BY THE CONSULTANT AND ANY TEST RESULTS WILL BE SENT TO YOUR REFERRING DOCTOR.   EXAM FINDINGS BY MD TODAY AND SIGNS AND SYMPTOMS TO REPORT TO CLINIC OR PRIMARY MD: Exam and discussion by MD.  No evidence of recurrence by exam.  Call us anytime you have questions or concerns.  MEDICATIONS PRESCRIBED: none   INSTRUCTIONS GIVEN AND DISCUSSED: Other :  Report any new lumps, bone pain or shortness of breath.  SPECIAL INSTRUCTIONS/FOLLOW-UP: Lab work Needed in 12 weeks and Return to Clinic in 12 weeks to see MD.   I acknowledge that I have been informed and understand all the instructions given to me and received a copy. I do not have any more questions at this time, but understand that I may call the Specialty Clinic at [facility] at [phone] during business hours should I have any further questions or need assistance in obtaining follow-up care.    __________________________________________  _____________  __________ Signature of Patient or Authorized Representative            Date                   Time    __________________________________________ Nurse's Signature

## 2012-09-26 ENCOUNTER — Ambulatory Visit
Admission: RE | Admit: 2012-09-26 | Discharge: 2012-09-26 | Disposition: A | Discharge status: Home / Self Care | Payer: 59 | Source: Ambulatory Visit | Attending: Radiation Oncology | Admitting: Radiation Oncology

## 2012-09-26 ENCOUNTER — Encounter: Payer: Self-pay | Admitting: Radiation Oncology

## 2012-09-26 VITALS — BP 161/87 | HR 99 | Temp 98.2°F | Wt 220.9 lb

## 2012-09-26 DIAGNOSIS — C50912 Malignant neoplasm of unspecified site of left female breast: Secondary | ICD-10-CM

## 2012-09-26 HISTORY — DX: Personal history of antineoplastic chemotherapy: Z92.21

## 2012-09-26 HISTORY — DX: Personal history of irradiation: Z92.3

## 2012-09-26 NOTE — Progress Notes (Signed)
FU today for assessment following cancer of the left breast.  No voiced

## 2012-09-26 NOTE — Progress Notes (Signed)
  Radiation Oncology         [phone]) [phone] ________________________________  Name: Jessica Simpson MRN: [id number]  Date: 09/26/2012  DOB: 1971/04/03  Follow-Up Visit Note  Diagnosis: Triple negative left breast cancer, clinical T2 N1 M0, yp T1c. N1a. M0  Interval Since Last Radiation:  She completed a total dose of 60 Gray in 30 fractions on 02/29/2012. She receive 50 gray in 25 fractions to the left breast, supraclavicular, and axillary regions. She then received a boost to the left lumpectomy of 10 gray in 5 fractions. This was given at [facility] [place].  Narrative:  The patient returns today for routine follow-up.  Overall, she is doing well. She is taking a history class at  the community college near her home. She continues to work in her job and raise her daughter who is now 39 years old. She underwent a bilateral mammogram on 06/30/2012 which was BI-RADS category 2.    She feels that her energy is coming back and she can be more "hands on" as a mother. She reports some residual lymphedema in her left arm and acknowledges that she needs to wear her sleeve and do her exercises as instructed more often. She does have a little bit of aching  in the axillary region and she reports that she doesn't feel strong in her left arm is her right  -this is mild.      No new bony pain. No unintentional weight loss. She reports her menstrual cycles may be returning, according to her OB/GYN        ALLERGIES:  is allergic to codeine and hydrocodone-acetaminophen.  Meds: Current Outpatient Prescriptions  Medication Sig Dispense Refill  . acetaminophen (TYLENOL) 500 MG tablet Take 500-1,000 mg by mouth every 6 (six) hours as needed. For pain      . fluticasone (FLONASE) 50 MCG/ACT nasal spray 2 sprays each nostril for 14 days then daily as needed.  1 g  0   No current facility-administered medications for this encounter.   Facility-Administered Medications Ordered in Other  Encounters  Medication Dose Route Frequency Provider Last Rate Last Dose  . lactated ringers infusion    Continuous PRN Andree Elk, CRNA        Physical Findings: The patient is in no acute distress. Patient is alert and oriented.  weight is 220 lb 14.4 oz (100.2 kg). Her temperature is 98.2 F (36.8 C). Her blood pressure is 161/87 and her pulse is 99. Marland Kitchen  No   Sitting comfortably in a chair, in no acute distress. Extraocular movements are intact. Oropharynx is clear. Neck is supple with no palpable cervical or supraclavicular lymphadenopathy. Breast exam bilaterally reveals no concerning lesions. Minimal fibrosis underneath the lumpectomy scar. No palpable axillary adenopathy bilaterally. Left arm is notable for some mild lymphedema  Lab Findings: Lab Results  Component Value Date   WBC 6.2 08/09/2012   HGB 12.6 08/09/2012   HCT 38.0 08/09/2012   MCV 78.0 08/09/2012   PLT 272 08/09/2012    Radiographic Findings: As above  Impression/Plan:   Doing well with no evidence of recurrent disease. I will see her back in one year. I encouraged her to continue following the instructions of physical therapy for her lymphedema. She is happy to drive to [facility] for followups. She knows to call if she has any issues before then. She'll continue being followed by medical oncology as well. _____________________________________   Lonie Peak, MD

## 2012-09-27 ENCOUNTER — Telehealth: Payer: Self-pay | Admitting: *Deleted

## 2012-09-27 ENCOUNTER — Ambulatory Visit: Payer: 59 | Admitting: Radiation Oncology

## 2012-09-27 ENCOUNTER — Ambulatory Visit: Payer: 59

## 2012-09-27 NOTE — Telephone Encounter (Signed)
CALLED PATIENT TO INFORM OF FU APPT. FOR 09-28-13 AT 3:20 PM , LVM FOR A RETURN CALL.

## 2012-09-29 ENCOUNTER — Ambulatory Visit: Payer: 59 | Admitting: Radiation Oncology

## 2012-10-04 ENCOUNTER — Encounter (INDEPENDENT_AMBULATORY_CARE_PROVIDER_SITE_OTHER): Payer: Self-pay | Admitting: Surgery

## 2012-10-04 ENCOUNTER — Ambulatory Visit (INDEPENDENT_AMBULATORY_CARE_PROVIDER_SITE_OTHER): Payer: 59 | Admitting: Surgery

## 2012-10-04 VITALS — BP 132/80 | HR 80 | Temp 98.0°F | Resp 18

## 2012-10-04 DIAGNOSIS — Z853 Personal history of malignant neoplasm of breast: Secondary | ICD-10-CM

## 2012-10-04 NOTE — Patient Instructions (Signed)
Continue ammual mammograms and follow up with me

## 2012-10-04 NOTE — Progress Notes (Signed)
NAME: Jessica Simpson       DOB: May 02, 1971           DATE: 10/04/2012       MRN: [id number]   Jessica Simpson is a 41 y.o.Marland Kitchenfemale who presents for routine followup of her Left breast IDC, Stage II, triple negative,  diagnosed in September, 2012 and treated with neoadjuvant chemo, lumpectomy, and axillary dissection, and radiation . She has no problems or concerns on either side.  PFSH: She has had no significant changes since the last visit here.  ROS: There have been no significant changes since the last visit here  EXAM:  VS: BP 132/80  Pulse 80  Temp 98 F (36.7 C)  Resp 18  General: The patient is alert, oriented, generally healthy appearing, NAD. Mood and affect are normal.  Breasts:  Breast are large, symmetric, dense, but smooth. No obvious post radiation changes. Not particularly tender  Lymphatics: She has no axillary or supraclavicular adenopathy on either side.  Extremities: Full ROM of the surgical side with no lymphedema noted.  Data Reviewed: Mammogram in September: Findings: Interval post lumpectomy and postradiation changes on  the left with surgical absence of the previously demonstrated left  breast and left axillary masses. Heterogeneously dense parenchyma  in both breasts is unchanged. No new findings suspicious for  malignancy in either breast.  Mammographic images were processed with CAD.  IMPRESSION:  No evidence of malignancy. A follow up bilateral diagnostic  mammogram is recommended in 1 year.  RECOMMENDATION:  Bilateral diagnostic mammogram in 1 year.  BI-RADS CATEGORY 2: Benign finding(s).  Original Report Authenticated By: Darrol Angel, M.D.    Impression: Doing well, with no evidence of recurrent cancer or new cancer  Plan: Will continue to follow up on an annual basis here.

## 2012-10-05 ENCOUNTER — Encounter: Payer: Self-pay | Admitting: Radiation Oncology

## 2012-10-05 NOTE — Progress Notes (Signed)
Pt is approved for 60% financial assistance effective 10/05/12 - 04/05/13.  I will mail approval letter and green card today.

## 2012-11-06 ENCOUNTER — Encounter (HOSPITAL_COMMUNITY): Payer: 59 | Attending: Oncology

## 2012-11-06 DIAGNOSIS — C50919 Malignant neoplasm of unspecified site of unspecified female breast: Secondary | ICD-10-CM | POA: Insufficient documentation

## 2012-11-06 DIAGNOSIS — C50912 Malignant neoplasm of unspecified site of left female breast: Secondary | ICD-10-CM

## 2012-11-06 LAB — CBC WITH DIFFERENTIAL/PLATELET
Basophils Absolute: 0 10*3/uL (ref 0.0–0.1)
Basophils Relative: 0 % (ref 0–1)
Eosinophils Absolute: 0.1 10*3/uL (ref 0.0–0.7)
Hemoglobin: 12.1 g/dL (ref 12.0–15.0)
MCH: 26.5 pg (ref 26.0–34.0)
MCHC: 33.2 g/dL (ref 30.0–36.0)
Monocytes Absolute: 0.5 10*3/uL (ref 0.1–1.0)
Monocytes Relative: 8 % (ref 3–12)
Neutro Abs: 4 10*3/uL (ref 1.7–7.7)
Neutrophils Relative %: 65 % (ref 43–77)
RDW: 13.9 % (ref 11.5–15.5)

## 2012-11-06 LAB — COMPREHENSIVE METABOLIC PANEL
AST: 12 U/L (ref 0–37)
Albumin: 3.7 g/dL (ref 3.5–5.2)
BUN: 13 mg/dL (ref 6–23)
Creatinine, Ser: 0.84 mg/dL (ref 0.50–1.10)
Potassium: 3.7 mEq/L (ref 3.5–5.1)
Total Protein: 7.5 g/dL (ref 6.0–8.3)

## 2012-11-06 NOTE — Progress Notes (Signed)
Jessica Simpson presented for labwork. Labs per MD order drawn via Peripheral Line 23 gauge needle inserted in Right AC  Good blood return present. Procedure without incident.  Needle removed intact. Patient tolerated procedure well.

## 2012-11-07 ENCOUNTER — Encounter (HOSPITAL_BASED_OUTPATIENT_CLINIC_OR_DEPARTMENT_OTHER): Payer: 59 | Admitting: Oncology

## 2012-11-07 ENCOUNTER — Encounter (HOSPITAL_COMMUNITY): Payer: Self-pay | Admitting: Oncology

## 2012-11-07 VITALS — BP 139/83 | HR 75 | Temp 98.0°F | Resp 16 | Wt 220.9 lb

## 2012-11-07 DIAGNOSIS — C50912 Malignant neoplasm of unspecified site of left female breast: Secondary | ICD-10-CM

## 2012-11-07 DIAGNOSIS — Z853 Personal history of malignant neoplasm of breast: Secondary | ICD-10-CM

## 2012-11-07 NOTE — Patient Instructions (Addendum)
[facility] Discharge Instructions  RECOMMENDATIONS MADE BY THE CONSULTANT AND ANY TEST RESULTS WILL BE SENT TO YOUR REFERRING PHYSICIAN.  Take 2 Aleve 2 times daily for 5-7 days and then stop. Return to clinic in 6 months. Lab work and MD appointment on the same day are fine. Report any issues/concerns to clinic as needed prior to appointment.  Thank you for choosing [facility] to provide your oncology and hematology care.  To afford each patient quality time with our providers, please arrive at least 15 minutes before your scheduled appointment time.  With your help, our goal is to use those 15 minutes to complete the necessary work-up to ensure our physicians have the information they need to help with your evaluation and healthcare recommendations.    Effective January 1st, 2014, we ask that you re-schedule your appointment with our physicians should you arrive 10 or more minutes late for your appointment.  We strive to give you quality time with our providers, and arriving late affects you and other patients whose appointments are after yours.    Again, thank you for choosing [facility].  Our hope is that these requests will decrease the amount of time that you wait before being seen by our physicians.       _____________________________________________________________  Should you have questions after your visit to [facility], please contact our office at [phone] between the hours of 8:30 a.m. and 5:00 p.m.  Voicemails left after 4:30 p.m. will not be returned until the following business day.  For prescription refill requests, have your pharmacy contact our office with your prescription refill request.

## 2012-11-07 NOTE — Progress Notes (Signed)
Problem #1 stage III(clinical T3 lesion by palpation and MRI), triple negative breast cancer, left-sided with a 7-7.6 cm mass at presentation in the upper outer quadrant palpable as well with a mass in the left axilla. She was treated with dose dense FEC followed by dose dense Taxotere. She had 4 cycles of each completed as of 11/22/1998 and her 13. She went on to have surgery by Dr. Jamey Ripa on 12/21/2011. She then went on to have definitive radiation therapy. She is here today for routine followup. She's had a minimal elevation of her alkaline phosphatase which has fluctuated. It is not different now. She has a negative oncology review of systems. She did fracture her right fourth metatarsal bone approximately a week to 10 days ago and is in an [employer] as of yesterday and already feels better. Vital signs are stable. Lymph nodes are negative throughout including cervical, supraclavicular, infraclavicular, axillary and inguinal areas. The left arm remains mildly swollen. She does not always use her sleeve. Lungs are clear to auscultation and percussion. Heart shows a regular rhythm and rate without murmur rub or gallop. Both breasts do not reveal any mass. The left breast reveals mild hyperpigmentation. Abdomen remains soft and nontender without organomegaly. Bowel sounds are normal. She has no leg edema.  Will see her in 6 months this time sooner if things change. She knows to call anytime

## 2013-05-07 ENCOUNTER — Other Ambulatory Visit (HOSPITAL_COMMUNITY): Payer: 59

## 2013-05-08 ENCOUNTER — Ambulatory Visit (HOSPITAL_COMMUNITY): Payer: 59

## 2013-05-08 ENCOUNTER — Other Ambulatory Visit (HOSPITAL_COMMUNITY): Payer: 59

## 2013-05-11 ENCOUNTER — Other Ambulatory Visit (HOSPITAL_COMMUNITY): Payer: Self-pay | Admitting: Hematology and Oncology

## 2013-05-11 ENCOUNTER — Encounter (HOSPITAL_COMMUNITY): Payer: 59 | Attending: Hematology and Oncology

## 2013-05-11 ENCOUNTER — Encounter (HOSPITAL_COMMUNITY): Payer: 59

## 2013-05-11 ENCOUNTER — Encounter (HOSPITAL_COMMUNITY): Payer: Self-pay

## 2013-05-11 VITALS — BP 149/81 | HR 73 | Wt 222.4 lb

## 2013-05-11 DIAGNOSIS — Z09 Encounter for follow-up examination after completed treatment for conditions other than malignant neoplasm: Secondary | ICD-10-CM | POA: Insufficient documentation

## 2013-05-11 DIAGNOSIS — C50912 Malignant neoplasm of unspecified site of left female breast: Secondary | ICD-10-CM

## 2013-05-11 DIAGNOSIS — Z9889 Other specified postprocedural states: Secondary | ICD-10-CM

## 2013-05-11 DIAGNOSIS — C50919 Malignant neoplasm of unspecified site of unspecified female breast: Secondary | ICD-10-CM

## 2013-05-11 DIAGNOSIS — Z853 Personal history of malignant neoplasm of breast: Secondary | ICD-10-CM | POA: Insufficient documentation

## 2013-05-11 LAB — CBC WITH DIFFERENTIAL/PLATELET
Eosinophils Relative: 1 % (ref 0–5)
HCT: 38.7 % (ref 36.0–46.0)
Lymphocytes Relative: 24 % (ref 12–46)
Lymphs Abs: 1.7 10*3/uL (ref 0.7–4.0)
MCH: 26.1 pg (ref 26.0–34.0)
MCV: 79 fL (ref 78.0–100.0)
Monocytes Absolute: 0.5 10*3/uL (ref 0.1–1.0)
Monocytes Relative: 7 % (ref 3–12)
RBC: 4.9 MIL/uL (ref 3.87–5.11)
WBC: 7 10*3/uL (ref 4.0–10.5)

## 2013-05-11 LAB — COMPREHENSIVE METABOLIC PANEL
ALT: 11 U/L (ref 0–35)
BUN: 10 mg/dL (ref 6–23)
CO2: 27 mEq/L (ref 19–32)
Calcium: 9.4 mg/dL (ref 8.4–10.5)
Creatinine, Ser: 0.89 mg/dL (ref 0.50–1.10)
GFR calc Af Amer: >90 mL/min (ref >90)
GFR calc non Af Amer: 79 mL/min — ABNORMAL LOW (ref >90)
Glucose, Bld: 91 mg/dL (ref 70–99)

## 2013-05-11 NOTE — Progress Notes (Signed)
[facility] Telephone:([phone]) [phone]   [phone]  OFFICE PROGRESS NOTE  Default, Provider, MD [address] [place] [address]  DIAGNOSIS:Stage III triple negative breast cancer.   ONCOLOGIC HISTORY: According to Dr.Neijsstrom's notes 1. Stage III triple negative breast cancer on the left side with a 7- 7.6 cm mass at presentation upper outer quadrant left breast, also  a palpable mass in the left axilla that was quite large. She was treated with dose dense FEC followed by dose dense Taxotere. She  had cycle 4 of FEC on 09/14/2011 and completed cycle 4 of dose dense Taxotere on 11/23/2011. She then went on to have surgery by  Dr. Cicero Duck on 12/21/2011. She was still found to have a 1 cm high-grade cancer with 3 of 7 positive nodes. LVI was not seen.  She had macrometastasis 1.0 cm in one lymph node, 4 mm in another node and she had isolated tumor cells in a third node.  Extracapsular extension was seen. She receive radiation therapy also.   2. BRCA1 and BRCA2 negativity.   INTERVAL HISTORY:   Jessica Simpson 42 y.o. female returns to the clinic today for  Scheduled followup. She denies any new complaints today. She denies any  breast lumps, shortness of breath or pain. Her weight is stable and her appetite is good. Her last mammogram was in September 2013 and was read as Birads 2 benign. Her blood pressure initially 188/114 to repeat was much lower as document under the vital signs section.  She denied any prior history of hypertension.     MEDICAL HISTORY: Past Medical History  Diagnosis Date  . BRCA1 negative 10/22/2011  . BRCA2 negative 10/22/2011  . Breast cancer, IDC, Left, Stage III, Triple negative 06/30/2011  . Hyperthyroidism     being checked for thyroid cyst  . Lymphedema of arm   . Lymphedema 06/15/2012  . S/P radiation therapy     Breast  . Status post chemotherapy Comp. 11/23/11    FEC and Taxotere  . Fracture     right 4th  toe    ALLERGIES:  is allergic to codeine and hydrocodone-acetaminophen.  MEDICATIONS:  Current Outpatient Prescriptions  Medication Sig Dispense Refill  . acetaminophen (TYLENOL) 500 MG tablet Take 500-1,000 mg by mouth every 6 (six) hours as needed. For pain      . fluticasone (FLONASE) 50 MCG/ACT nasal spray 2 sprays each nostril for 14 days then daily as needed.  1 g  0   No current facility-administered medications for this visit.   Facility-Administered Medications Ordered in Other Visits  Medication Dose Route Frequency Provider Last Rate Last Dose  . lactated ringers infusion    Continuous PRN Andree Elk, CRNA        SURGICAL HISTORY:  Past Surgical History  Procedure Laterality Date  . Removal breast mass  2004    benign  . Left breast biopsy with axillary biopsy      core bx  . Portacath placement  08/02/2011    dr Jamey Ripa  . Tumor removal      on left hand  . Tumor removal      rt. eye  . 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 Breast surgery    . Incision and drainage abscess anal      x3  . Port-a-cath removal  12/21/2011    Procedure: REMOVAL PORT-A-CATH;  Surgeon: Currie Paris, MD;  Location: [facility];  Service: General;  Laterality: N/A;  . Evacuation breast hematoma  12/21/2011    Procedure: EVACUATION HEMATOMA BREAST;  Surgeon: Almond Lint, MD;  Location: [facility] OR;  Service: General;  Laterality: Left;  . Evacuation breast hematoma  12/21/2011    Procedure: EVACUATION HEMATOMA BREAST;  Surgeon: Almond Lint, MD;  Location: [facility] OR;  Service: General;  Laterality: Left;  . Irrigation and debridement hematoma  12/21/2011    Procedure: IRRIGATION AND DEBRIDEMENT HEMATOMA;  Surgeon: Almond Lint, MD;  Location: [facility] OR;  Service: General;  Laterality: Left;     REVIEW OF SYSTEMS: 14 point review of system is as in the history above otherwise negative.    PHYSICAL EXAMINATION: Blood pressure 149/81, pulse 73,  weight 222 lb 6.4 oz (100.88 kg). GENERAL: No distress. SKIN:  No rashes or significant lesions  HEAD: Normocephalic, No masses, lesions, tenderness or abnormalities  EYES: Conjunctiva are pink and non-injected   ENT: External ears normal ,lips, buccal mucosa, and tongue normal and mucous membranes are moist  LYMPH: No palpable lymphadenopathy, in the neck supraclavicular areas or axilla BREAST:well-healed left mastectomy scar.  No palpable masses on the margin in the breasts. LUNGS: clear to auscultation , no crackles or wheezes  HEART: regular rate & rhythm, no murmurs, no gallops, S1 normal and S2 normal  ABDOMEN: Abdomen soft, non-tender, normal bowel sounds, no masses or organomegaly and no hepatosplenomegaly palpable EXTREMITIES: No edema, no skin discoloration or tenderness     LABORATORY DATA: Lab Results  Component Value Date   WBC 6.2 11/06/2012   HGB 12.1 11/06/2012   HCT 36.4 11/06/2012   MCV 79.8 11/06/2012   PLT 299 11/06/2012      Chemistry      Component Value Date/Time   NA 139 11/06/2012 1452   K 3.7 11/06/2012 1452   CL 104 11/06/2012 1452   CO2 26 11/06/2012 1452   BUN 13 11/06/2012 1452   CREATININE 0.84 11/06/2012 1452      Component Value Date/Time   CALCIUM 9.4 11/06/2012 1452   ALKPHOS 125* 11/06/2012 1452   AST 12 11/06/2012 1452   ALT 12 11/06/2012 1452   BILITOT 0.2* 11/06/2012 1452       RADIOGRAPHIC STUDIES: No results found.   ASSESSMENT:  1.  Patient has no evidence of disease as regards the triple-negative breast cancer. 2.  Elevated blood pressure suspicious for hypertension.    PLAN:  1. She'll return to clinic in 3 months, given that the first 2-3 years is critical in terms of relapse. 2. Counseled that routine blood work one scans are not recommended for breast cancer surveillance. 3. Patient will monitor her blood pressure of home for about 2 weeks and call the results was while she looks for a primary care physician 4. Counseled on the  natural history of triple-negative breast cancer.   5. Mammogram in September 2014.    All questions were satisfactorily answered. Patient knows to call if  any concern arises.  I spent more than 50 % counseling the patient face to face. The total time spent in the appointment was 30 minutes.   Sherral Hammers, MD FACP. Hematology/Oncology.

## 2013-05-11 NOTE — Patient Instructions (Addendum)
[facility] Discharge Instructions  RECOMMENDATIONS MADE BY THE CONSULTANT AND ANY TEST RESULTS WILL BE SENT TO YOUR REFERRING PHYSICIAN.  EXAM FINDINGS BY THE PHYSICIAN TODAY AND SIGNS OR SYMPTOMS TO REPORT TO CLINIC OR PRIMARY PHYSICIAN:   Exam was good. Dr. Beckie Busing did not detect any lumps/masses, etc.  Dr. Beckie Busing doesn't think you need blood work every 3 months but does want you to return every 3 months for a physical examination. If you have any problems with your breasts or notice anything different - please call us.   Please keep a check on your blood pressure over the next couple of weeks and call [employer] [facility] with BP results.    Thank you for choosing [facility] to provide your oncology and hematology care.  To afford each patient quality time with our providers, please arrive at least 15 minutes before your scheduled appointment time.  With your help, our goal is to use those 15 minutes to complete the necessary work-up to ensure our physicians have the information they need to help with your evaluation and healthcare recommendations.    Effective January 1st, 2014, we ask that you re-schedule your appointment with our physicians should you arrive 10 or more minutes late for your appointment.  We strive to give you quality time with our providers, and arriving late affects you and other patients whose appointments are after yours.    Again, thank you for choosing [facility].  Our hope is that these requests will decrease the amount of time that you wait before being seen by our physicians.       _____________________________________________________________  Should you have questions after your visit to [facility], please contact our office at [phone] between the hours of 8:30 a.m. and 5:00 p.m.  Voicemails left after 4:30 p.m. will not be returned until the following business day.  For prescription refill requests, have your  pharmacy contact our office with your prescription refill request.

## 2013-07-18 ENCOUNTER — Other Ambulatory Visit (HOSPITAL_COMMUNITY)
Admission: RE | Admit: 2013-07-18 | Discharge: 2013-07-18 | Disposition: A | Discharge status: Home / Self Care | Payer: 59 | Source: Ambulatory Visit | Attending: Obstetrics and Gynecology | Admitting: Obstetrics and Gynecology

## 2013-07-18 ENCOUNTER — Ambulatory Visit (HOSPITAL_COMMUNITY)
Admission: RE | Admit: 2013-07-18 | Discharge: 2013-07-18 | Disposition: A | Discharge status: Home / Self Care | Payer: 59 | Source: Ambulatory Visit | Attending: Hematology and Oncology | Admitting: Hematology and Oncology

## 2013-07-18 ENCOUNTER — Encounter: Payer: Self-pay | Admitting: Adult Health

## 2013-07-18 ENCOUNTER — Ambulatory Visit (INDEPENDENT_AMBULATORY_CARE_PROVIDER_SITE_OTHER): Payer: 59 | Admitting: Adult Health

## 2013-07-18 VITALS — BP 156/90 | HR 74 | Ht 66.0 in | Wt 223.0 lb

## 2013-07-18 DIAGNOSIS — Z9889 Other specified postprocedural states: Secondary | ICD-10-CM

## 2013-07-18 DIAGNOSIS — Z1389 Encounter for screening for other disorder: Secondary | ICD-10-CM

## 2013-07-18 DIAGNOSIS — Z1212 Encounter for screening for malignant neoplasm of rectum: Secondary | ICD-10-CM

## 2013-07-18 DIAGNOSIS — Z853 Personal history of malignant neoplasm of breast: Secondary | ICD-10-CM

## 2013-07-18 DIAGNOSIS — Z01419 Encounter for gynecological examination (general) (routine) without abnormal findings: Secondary | ICD-10-CM | POA: Insufficient documentation

## 2013-07-18 LAB — HEMOCCULT GUIAC POC 1CARD (OFFICE)

## 2013-07-18 LAB — POCT URINALYSIS DIPSTICK
Glucose, UA: NEGATIVE
Nitrite, UA: NEGATIVE

## 2013-07-18 NOTE — Patient Instructions (Addendum)
Physical in 1 year Mammogram yearly Call for appt with Dr Despina Hidden and [place]

## 2013-07-18 NOTE — Progress Notes (Signed)
Patient ID: Jessica Simpson, female   DOB: August 16, 1971, 42 y.o.   MRN: [id number] History of Present Illness: Emmery is a 42 year old black female in for pap and physical.She had breast cancer in March 2013 and underwent chemo and radiation.Her breast cancer was IDC,triple negative on the left.  Current Medications, Allergies, Past Medical History, Past Surgical History, Family History and Social History were reviewed in [employer] record.    Review of Systems: Patient denies any headaches, blurred vision, shortness of breath, chest pain, abdominal pain, problems with bowel movements or intercourse. She does have frequent urination at times.And periods are regular.No joint pain or mood changes,she is having hot flashes.She had her mammogram today.  Physical Exam:BP 156/90  Pulse 74  Ht 5\' 6"  (1.676 m)  Wt 223 lb (101.152 kg)  BMI 36.01 kg/m2  LMP 09/08/2014urine negative  BP recheck 140/90. General:  Well developed, well nourished, no acute distress Skin:  Warm and dry Neck:  Midline trachea, normal thyroid Lungs; Clear to auscultation bilaterally Breast:  No dominant palpable mass, retraction, or nipple discharge,she does have dense feeling breast tissue. Cardiovascular: Regular rate and rhythm Abdomen:  Soft, non tender, no hepatosplenomegaly Pelvic:  External genitalia is normal in appearance.  The vagina is normal in appearance.  The cervix is nulliparous.Pap performed.  Uterus is felt to be normal size, shape, and contour.  No adnexal masses or tenderness noted. Rectal: Good sphincter tone, no polyps, or hemorrhoids felt.  Hemoccult negative. Extremities:  No swelling or varicosities noted Psych:  Alert and cooperative, seems nervous, thinks she will get cancer again,she has been reading about her breast cancer and wants her ovaries removed, I discussed with Dr Despina Hidden and he said he could do that.We discussed hysterectomy and I gave her a handout by Gaylyn Rong.I  also gave her the insert on brisdelle to read and research to see if she is interested in it for the hot flashes.She had a pelvic US in November, for resumed periods  and was supposed to come back in 6 months for another in follow up but did not get here til now, will schedule [place] and appt to see Dr Despina Hidden to discuss surgery.Pt will call for this appt after she checks her schedule.   Impression: Yearly Gyn exam History of breast cancer on left,IDC,triple negative    Plan: Physical in 1 year Mammogram yearly Colonoscopy at 50 Pt to call to schedule [place] and see Dr Despina Hidden Review hand by Gaylyn Rong on hysterectomy

## 2013-08-13 ENCOUNTER — Encounter (HOSPITAL_COMMUNITY): Payer: Self-pay

## 2013-08-13 ENCOUNTER — Encounter (HOSPITAL_COMMUNITY): Payer: 59 | Attending: Hematology and Oncology

## 2013-08-13 VITALS — BP 165/103 | HR 73 | Temp 97.5°F | Resp 16 | Wt 223.9 lb

## 2013-08-13 DIAGNOSIS — C50919 Malignant neoplasm of unspecified site of unspecified female breast: Secondary | ICD-10-CM

## 2013-08-13 DIAGNOSIS — Z09 Encounter for follow-up examination after completed treatment for conditions other than malignant neoplasm: Secondary | ICD-10-CM | POA: Insufficient documentation

## 2013-08-13 DIAGNOSIS — E049 Nontoxic goiter, unspecified: Secondary | ICD-10-CM

## 2013-08-13 DIAGNOSIS — Z853 Personal history of malignant neoplasm of breast: Secondary | ICD-10-CM | POA: Insufficient documentation

## 2013-08-13 DIAGNOSIS — C50912 Malignant neoplasm of unspecified site of left female breast: Secondary | ICD-10-CM

## 2013-08-13 LAB — COMPREHENSIVE METABOLIC PANEL
ALT: 13 U/L (ref 0–35)
AST: 14 U/L (ref 0–37)
Alkaline Phosphatase: 117 U/L (ref 39–117)
BUN: 11 mg/dL (ref 6–23)
CO2: 27 mEq/L (ref 19–32)
Chloride: 102 mEq/L (ref 96–112)
GFR calc Af Amer: >90 mL/min (ref >90)
Glucose, Bld: 97 mg/dL (ref 70–99)
Potassium: 3.5 mEq/L (ref 3.5–5.1)
Sodium: 138 mEq/L (ref 135–145)
Total Protein: 7.5 g/dL (ref 6.0–8.3)

## 2013-08-13 LAB — CBC WITH DIFFERENTIAL/PLATELET
Eosinophils Absolute: 0.1 10*3/uL (ref 0.0–0.7)
HCT: 37.4 % (ref 36.0–46.0)
Hemoglobin: 12.3 g/dL (ref 12.0–15.0)
Lymphocytes Relative: 29 % (ref 12–46)
Lymphs Abs: 2.1 10*3/uL (ref 0.7–4.0)
Neutrophils Relative %: 60 % (ref 43–77)
Platelets: 263 10*3/uL (ref 150–400)
RBC: 4.68 MIL/uL (ref 3.87–5.11)
WBC: 7 10*3/uL (ref 4.0–10.5)

## 2013-08-13 NOTE — Progress Notes (Signed)
[facility] OFFICE PROGRESS NOTE  Default, Provider, MD [address] [place] [address]  DIAGNOSIS: Cancer of left breast - Plan: CBC with Differential, Comprehensive metabolic panel, CEA, Cancer antigen 27.29, CBC with Differential, Comprehensive metabolic panel, CEA, Cancer antigen 27.29  Enlarged thyroid  Chief Complaint  Patient presents with  . Breast Cancer    CURRENT THERAPY: Watchful expectation  INTERVAL HISTORY: Jessica Simpson 42 y.o. female returns for followup of locally advanced left breast cancer, status post lumpectomy, preceded by neoadjuvant chemotherapy followed by definitive surgery and axillary lymph node dissection. Postoperatively she received external beam radiotherapy to the remainder of the left breast but no additional chemotherapy or endocrine treatment since her tumor was triple negative. 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 She denies any significant hot flashes and continues to menstruate the last. Lasting 4 days occurring 2 weeks ago. She denies any dysuria, hematuria, cough, wheezing, but does have allergies in the spring. She denies any lower extremity swelling or redness and does suffer with intermittent left upper extremity lymphedema for which she does have a sleeve but does not wear it regularly. She denies any chest pain, exertional dyspnea, skin rash, joint pain, headache, or seizures but does have minimal residual paresthesias involving both upper extremities.   MEDICAL HISTORY: Past Medical History  Diagnosis Date  . BRCA1 negative 10/22/2011  . BRCA2 negative 10/22/2011  . Breast cancer, IDC, Left, Stage III, Triple negative 06/30/2011  . Lymphedema of arm   . Lymphedema 06/15/2012  . S/P radiation therapy     Breast  . Status post chemotherapy Comp. 11/23/11    FEC and Taxotere  . Fracture     right 4th toe    INTERIM HISTORY: has Breast cancer, IDC, Left,  Stage III, Triple negative; Chemotherapy-induced neutropenia; BRCA1 negative; BRCA2 negative; and Lymphedema on her problem list.   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 She was treated with dose dense FEC followed by dose dense Taxotere. She had 4 cycles of FEC on 09/14/2011 and completed cycle 4 of dose dense Taxotere on 11/23/2011. She then went on to have surgery by Dr. Cicero Duck on 12/21/2011. She was still found to have a 1 cm high-grade cancer with 3 of 7 positive nodes. LVI was not seen.  1.0 cm in one lymph node, 4 mm in another node and she had isolated tumor cells in a third node. Extracapsular extension was seen. She receive radiation therapy also.  ALLERGIES:  is allergic to codeine.  MEDICATIONS: has a current medication list which includes the following prescription(s): acetaminophen and fluticasone, and the following Facility-Administered Medications: lactated ringers.  SURGICAL HISTORY:  Past Surgical History  Procedure Laterality Date  . Removal breast mass  2004    benign  . Left breast biopsy with axillary biopsy      core bx  . Portacath placement  08/02/2011    dr Jamey Ripa  . Tumor removal      on left hand  . Tumor removal      rt. eye  . 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 Breast surgery    . Incision and drainage abscess anal      x3  .  Port-a-cath removal  12/21/2011    Procedure: REMOVAL PORT-A-CATH;  Surgeon: Currie Paris, MD;  Location: [facility];  Service: General;  Laterality: N/A;  . Evacuation breast hematoma  12/21/2011    Procedure: EVACUATION HEMATOMA BREAST;  Surgeon: Almond Lint, MD;  Location: [facility];  Service: General;  Laterality: Left;  . Evacuation breast hematoma  12/21/2011    Procedure: EVACUATION HEMATOMA BREAST;  Surgeon: Almond Lint, MD;   Location: [facility];  Service: General;  Laterality: Left;  . Irrigation and debridement hematoma  12/21/2011    Procedure: IRRIGATION AND DEBRIDEMENT HEMATOMA;  Surgeon: Almond Lint, MD;  Location: [facility];  Service: General;  Laterality: Left;  . Hand surgery      tumor on finger, left hand  . Cesarean section      FAMILY HISTORY: family history includes Cancer in her maternal grandmother; Cancer (age of onset: 72) in her father.  SOCIAL HISTORY:  reports that she has never smoked. She has never used smokeless tobacco. She reports that she does not drink alcohol or use illicit drugs.  REVIEW OF SYSTEMS:  Other than that discussed above is noncontributory.  PHYSICAL EXAMINATION: ECOG PERFORMANCE STATUS: 0 - Asymptomatic  Blood pressure 165/103, pulse 73, temperature 97.5 F (36.4 C), temperature source Oral, resp. rate 16, weight 223 lb 14.4 oz (101.56 kg), last menstrual period 07/02/2013.  GENERAL:alert, no distress and comfortable SKIN: skin color, texture, turgor are normal, no rashes or significant lesions EYES: PERLA; Conjunctiva are pink and non-injected, sclera clear OROPHARYNX:no exudate, no erythema on lips, buccal mucosa, or tongue. NECK: supple, thyroid minimally diffusely enlarged, non-tender, without nodularity. No masses CHEST: Status post left breast lumpectomy with no masses in either breast. LYMPH:  no palpable lymphadenopathy in the cervical, axillary or inguinal LUNGS: clear to auscultation and percussion with normal breathing effort HEART: regular rate & rhythm and no murmurs and no lower extremity edema ABDOMEN:abdomen soft, non-tender and normal bowel sounds MUSCULOSKELETAL:no cyanosis of digits and no clubbing. Range of motion normal.  NEURO: alert & oriented x 3 with fluent speech, no focal motor/sensory deficits   LABORATORY DATA: Office Visit on 07/18/2013  Component Date Value Range Status  . Fecal Occult Blood, POC 07/18/2013 Negative   Final  . Glucose,  UA 07/18/2013 neg   Final  . Blood, UA 07/18/2013 neg   Final  . Protein, UA 07/18/2013 neg   Final  . Nitrite, UA 07/18/2013 neg   Final  . Leukocytes, UA 07/18/2013 Negative   Final    PATHOLOGY: No new pathology.  Urinalysis    Component Value Date/Time   NITRITE neg 07/18/2013 1846   LEUKOCYTESUR Negative 07/18/2013 1846    RADIOGRAPHIC STUDIES: Mm Digital Diagnostic Bilat  07/18/2013   *RADIOLOGY REPORT*  Clinical Data:  Patient with history of left breast lumpectomy, radiotherapy and chemotherapy.  DIGITAL DIAGNOSTIC BILATERAL MAMMOGRAM WITH CAD  Comparison: Prior examinations dating back to 01/01/2011.  Findings:  ACR Breast Density Category c:  The breast tissue is heterogeneously dense, which may obscure small masses.  Stable post lumpectomy and radiation changes within the left breast.  Interval development of density within the upper inner aspect of the right breast posteriorly which corresponds with scarring from prior port.  No concerning masses, calcifications or nonsurgical architectural distortion.  Mammographic images were processed with CAD.  IMPRESSION: No mammographic evidence for malignancy.  Stable post lumpectomy changes could  RECOMMENDATION: Bilateral diagnostic mammography in 1 year.  I have discussed the findings  and recommendations with the patient. Results were also provided in writing at the conclusion of the visit.  If applicable, a reminder letter will be sent to the patient regarding her next appointment.  BI-RADS CATEGORY 2:  Benign finding(s).   Original Report Authenticated By: Annia Belt, M.D    ASSESSMENT: 1.  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 She was treated with dose dense FEC followed by dose dense Taxotere. She had 4 cycles of FEC on 09/14/2011 and completed cycle 4 of dose dense Taxotere on  11/23/2011. She then went on to have surgery by Dr. Cicero Duck on 12/21/2011. She was still found to have a 1 cm high-grade cancer with 3 of 7 positive nodes. LVI was not seen.  1.0 cm in one lymph node, 4 mm in another node and she had isolated tumor cells in a third node. Extracapsular extension was seen. She receive radiation therapy also. #2. BRCA1 and BRCA2 negative. #3. Thyroid cyst   PLAN:  #1. Continue monthly self breast examination. #2. Patient has received influenza virus vaccine. #3. Office visit in 3 months with lab tests including CBC, chem profile, CEA, and CA 27-29. She was instructed to call should any new symptoms occur that are troublesome and persistent.   All questions were answered. The patient knows to call the clinic with any problems, questions or concerns. We can certainly see the patient much sooner if necessary.   I spent 25 minutes counseling the patient face to face. The total time spent in the appointment was 30 minutes.    Maurilio Lovely, MD 08/13/2013 4:09 PM

## 2013-08-13 NOTE — Patient Instructions (Signed)
[facility] Discharge Instructions  RECOMMENDATIONS MADE BY THE CONSULTANT AND ANY TEST RESULTS WILL BE SENT TO YOUR REFERRING PHYSICIAN.  EXAM FINDINGS BY THE PHYSICIAN TODAY AND SIGNS OR SYMPTOMS TO REPORT TO CLINIC OR PRIMARY PHYSICIAN: Exam and findings as discussed by Dr. Zigmund Daniel.  INSTRUCTIONS/FOLLOW-UP: 1.  You had labs performed today - please contact our clinic by Friday if you have not heard back from your results and are interested. 2.  Return in 3 months for an office visit and labs again at that time.  Please feel free to contact us sooner if you have questions or concerns.  Thank you for choosing [facility] to provide your oncology and hematology care.  To afford each patient quality time with our providers, please arrive at least 15 minutes before your scheduled appointment time.  With your help, our goal is to use those 15 minutes to complete the necessary work-up to ensure our physicians have the information they need to help with your evaluation and healthcare recommendations.    Effective January 1st, 2014, we ask that you re-schedule your appointment with our physicians should you arrive 10 or more minutes late for your appointment.  We strive to give you quality time with our providers, and arriving late affects you and other patients whose appointments are after yours.    Again, thank you for choosing [facility].  Our hope is that these requests will decrease the amount of time that you wait before being seen by our physicians.       _____________________________________________________________  Should you have questions after your visit to [facility], please contact our office at [phone] between the hours of 8:30 a.m. and 5:00 p.m.  Voicemails left after 4:30 p.m. will not be returned until the following business day.  For prescription refill requests, have your pharmacy contact our office with your  prescription refill request.

## 2013-08-14 LAB — CANCER ANTIGEN 27.29: CA 27.29: 40 U/mL — ABNORMAL HIGH (ref 0–39)

## 2013-08-29 ENCOUNTER — Encounter (INDEPENDENT_AMBULATORY_CARE_PROVIDER_SITE_OTHER): Payer: Self-pay | Admitting: Surgery

## 2013-09-28 ENCOUNTER — Inpatient Hospital Stay: Admission: RE | Admit: 2013-09-28 | Payer: Self-pay | Source: Ambulatory Visit | Admitting: Radiation Oncology

## 2013-11-09 ENCOUNTER — Other Ambulatory Visit (HOSPITAL_COMMUNITY): Payer: 59

## 2013-11-12 ENCOUNTER — Encounter (HOSPITAL_COMMUNITY): Payer: 59 | Attending: Hematology and Oncology

## 2013-11-12 ENCOUNTER — Encounter (HOSPITAL_COMMUNITY): Payer: 59

## 2013-11-12 ENCOUNTER — Encounter (HOSPITAL_COMMUNITY): Payer: Self-pay

## 2013-11-12 VITALS — BP 176/83 | HR 66 | Temp 97.9°F | Resp 16

## 2013-11-12 DIAGNOSIS — C50912 Malignant neoplasm of unspecified site of left female breast: Secondary | ICD-10-CM

## 2013-11-12 DIAGNOSIS — Z853 Personal history of malignant neoplasm of breast: Secondary | ICD-10-CM

## 2013-11-12 DIAGNOSIS — Z09 Encounter for follow-up examination after completed treatment for conditions other than malignant neoplasm: Secondary | ICD-10-CM | POA: Insufficient documentation

## 2013-11-12 LAB — COMPREHENSIVE METABOLIC PANEL
ALBUMIN: 3.7 g/dL (ref 3.5–5.2)
ALT: 13 U/L (ref 0–35)
AST: 14 U/L (ref 0–37)
Alkaline Phosphatase: 105 U/L (ref 39–117)
BUN: 14 mg/dL (ref 6–23)
CO2: 26 mEq/L (ref 19–32)
Calcium: 9.4 mg/dL (ref 8.4–10.5)
Chloride: 100 mEq/L (ref 96–112)
Creatinine, Ser: 0.84 mg/dL (ref 0.50–1.10)
GFR calc Af Amer: >90 mL/min (ref >90)
GFR calc non Af Amer: 85 mL/min — ABNORMAL LOW (ref >90)
Glucose, Bld: 86 mg/dL (ref 70–99)
POTASSIUM: 3.8 meq/L (ref 3.7–5.3)
SODIUM: 138 meq/L (ref 137–147)
Total Bilirubin: 0.2 mg/dL — ABNORMAL LOW (ref 0.3–1.2)
Total Protein: 7.5 g/dL (ref 6.0–8.3)

## 2013-11-12 LAB — CBC WITH DIFFERENTIAL/PLATELET
BASOS PCT: 1 % (ref 0–1)
Basophils Absolute: 0 10*3/uL (ref 0.0–0.1)
Eosinophils Absolute: 0.2 10*3/uL (ref 0.0–0.7)
Eosinophils Relative: 2 % (ref 0–5)
HCT: 38.2 % (ref 36.0–46.0)
HEMOGLOBIN: 12.6 g/dL (ref 12.0–15.0)
LYMPHS PCT: 30 % (ref 12–46)
Lymphs Abs: 1.9 10*3/uL (ref 0.7–4.0)
MCH: 26.4 pg (ref 26.0–34.0)
MCHC: 33 g/dL (ref 30.0–36.0)
MCV: 79.9 fL (ref 78.0–100.0)
Monocytes Absolute: 0.5 10*3/uL (ref 0.1–1.0)
Monocytes Relative: 8 % (ref 3–12)
NEUTROS PCT: 59 % (ref 43–77)
Neutro Abs: 3.7 10*3/uL (ref 1.7–7.7)
Platelets: 300 10*3/uL (ref 150–400)
RBC: 4.78 MIL/uL (ref 3.87–5.11)
RDW: 13.9 % (ref 11.5–15.5)
WBC: 6.3 10*3/uL (ref 4.0–10.5)

## 2013-11-12 NOTE — Progress Notes (Signed)
[place]  OFFICE PROGRESS NOTE  Default, Provider, MD [place] [address]  DIAGNOSIS: Cancer of left breast - Plan: CBC with Differential, Comprehensive metabolic panel, CEA, Cancer antigen 27.29  Chief Complaint  Patient presents with  . Breast Cancer    CURRENT THERAPY: watchful expectation  INTERVAL HISTORY: Jessica Simpson 43 y.o. female returns for followup of stage III left breast cancer, status post neoadjuvant chemotherapy followed by modified radical mastectomy at which time 1 cm high-grade cancer was still present along with metastases to 3 of 7 lymph nodes. Extracapsular extension was noted and she received external beam radiotherapy. She is BRCA1 and BRCA2 negative.  She is concerned because she urinates a lot but she does drink copious amounts of fluids all day including during the night. She denies any dysuria or incontinence. She also denies any hematuria and did resume menstrual periods in October of 2013 occurring for 3 or 4 days every 28 days. She denies any fever, night sweats, worsening left upper extremity lymphedema, lower extremity swelling or redness, cough, wheezing, nasal drip, earache, skin rash, peripheral paresthesias except a small amount in both feet, headache, or seizures.  MEDICAL HISTORY: Past Medical History  Diagnosis Date  . BRCA1 negative 10/22/2011  . BRCA2 negative 10/22/2011  . Breast cancer, IDC, Left, Stage III, Triple negative 06/30/2011  . Lymphedema of arm   . Lymphedema 06/15/2012  . S/P radiation therapy     Breast  . Status post chemotherapy Comp. 11/23/11    FEC and Taxotere  . Fracture     right 4th toe    INTERIM HISTORY: has Breast cancer, IDC, Left, Stage III, Triple negative; Chemotherapy-induced neutropenia; BRCA1 negative; BRCA2 negative; and Lymphedema on her problem list.   Stage III triple negative breast cancer on the left side with a 7- 7.6 cm mass at  presentation upper outer quadrant left breast, also a palpable mass in the left axilla that was quite large. She was treated with dose dense FEC followed by dose dense Taxotere. She had 4 cycles of FEC on 09/14/2011 and completed cycle 4 of dose dense Taxotere on 11/23/2011. She then went on to have surgery by Dr. Osborn Coho on 12/21/2011. She was still found to have a 1 cm high-grade cancer with 3 of 7 positive nodes. LVI was not seen. 1.0 cm in one lymph node, 4 mm in another node and she had isolated tumor cells in a third node. Extracapsular extension was seen. She receive radiation therapy also. BRCA1 and BRCA2 negative.  ALLERGIES:  is allergic to codeine.  MEDICATIONS: has a current medication list which includes the following prescription(s): acetaminophen and fluticasone, and the following Facility-Administered Medications: lactated ringers.  SURGICAL HISTORY:  Past Surgical History  Procedure Laterality Date  . Removal breast mass  2004    benign  . Left breast biopsy with axillary biopsy      core bx  . Portacath placement  08/02/2011    dr Margot Chimes  . Tumor removal      on left hand  . Tumor removal      rt. eye  . 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 Breast surgery    . Incision and drainage abscess anal      x3  . Port-a-cath removal  12/21/2011    Procedure: REMOVAL PORT-A-CATH;  Surgeon: Haywood Lasso, MD;  Location: [place];  Service: General;  Laterality: N/A;  . Evacuation breast hematoma  12/21/2011    Procedure: EVACUATION HEMATOMA BREAST;  Surgeon: Stark Klein, MD;  Location: [place];  Service: General;  Laterality: Left;  . Evacuation breast hematoma  12/21/2011    Procedure: EVACUATION HEMATOMA BREAST;  Surgeon: Stark Klein, MD;  Location: [place];  Service: General;  Laterality: Left;  . Irrigation and debridement hematoma  12/21/2011    Procedure: IRRIGATION AND DEBRIDEMENT HEMATOMA;  Surgeon: Stark Klein, MD;   Location: [place];  Service: General;  Laterality: Left;  . Hand surgery      tumor on finger, left hand  . Cesarean section      FAMILY HISTORY: family history includes Cancer in her maternal grandmother; Cancer (age of onset: 14) in her father.  SOCIAL HISTORY:  reports that she has never smoked. She has never used smokeless tobacco. She reports that she does not drink alcohol or use illicit drugs.  REVIEW OF SYSTEMS:  Other than that discussed above is noncontributory.  PHYSICAL EXAMINATION: ECOG PERFORMANCE STATUS: 1 - Symptomatic but completely ambulatory  Blood pressure 176/83, pulse 66, temperature 97.9 F (36.6 C), temperature source Oral, resp. rate 16.  GENERAL:alert, no distress and comfortable SKIN: skin color, texture, turgor are normal, no rashes or significant lesions EYES: PERLA; Conjunctiva are pink and non-injected, sclera clear OROPHARYNX:no exudate, no erythema on lips, buccal mucosa, or tongue. NECK: supple, thyroid normal size, non-tender, without nodularity. No masses CHEST: status post left breast lumpectomy with no masses in either breast. LYMPH:  no palpable lymphadenopathy in the cervical, axillary or inguinal LUNGS: clear to auscultation and percussion with normal breathing effort HEART: regular rate & rhythm and no murmurs. ABDOMEN:abdomen soft, non-tender and normal bowel sounds MUSCULOSKELETAL:no cyanosis of digits and no clubbing. Range of motion normal.  NEURO: alert & oriented x 3 with fluent speech, no focal motor/sensory deficits   LABORATORY DATA: No visits with results within 30 Day(s) from this visit. Latest known visit with results is:  Office Visit on 08/13/2013  Component Date Value Range Status  . WBC 08/13/2013 7.0  4.0 - 10.5 K/uL Final  . RBC 08/13/2013 4.68  3.87 - 5.11 MIL/uL Final  . Hemoglobin 08/13/2013 12.3  12.0 - 15.0 g/dL Final  . HCT 08/13/2013 37.4  36.0 - 46.0 % Final  . MCV 08/13/2013 79.9  78.0 - 100.0 fL Final  .  MCH 08/13/2013 26.3  26.0 - 34.0 pg Final  . MCHC 08/13/2013 32.9  30.0 - 36.0 g/dL Final  . RDW 08/13/2013 13.9  11.5 - 15.5 % Final  . Platelets 08/13/2013 263  150 - 400 K/uL Final  . Neutrophils Relative % 08/13/2013 60  43 - 77 % Final  . Neutro Abs 08/13/2013 4.2  1.7 - 7.7 K/uL Final  . Lymphocytes Relative 08/13/2013 29  12 - 46 % Final  . Lymphs Abs 08/13/2013 2.1  0.7 - 4.0 K/uL Final  . Monocytes Relative 08/13/2013 9  3 - 12 % Final  . Monocytes Absolute 08/13/2013 0.6  0.1 - 1.0 K/uL Final  . Eosinophils Relative 08/13/2013 2  0 - 5 % Final  . Eosinophils Absolute 08/13/2013 0.1  0.0 - 0.7 K/uL Final  . Basophils Relative 08/13/2013 0  0 - 1 % Final  . Basophils Absolute 08/13/2013 0.0  0.0 - 0.1 K/uL Final  . Sodium 08/13/2013 138  135 - 145 mEq/L Final  . Potassium 08/13/2013 3.5  3.5 - 5.1 mEq/L Final  . Chloride 08/13/2013 102  96 - 112 mEq/L Final  . CO2 08/13/2013 27  19 - 32 mEq/L Final  . Glucose, Bld 08/13/2013 97  70 - 99 mg/dL Final  . BUN 08/13/2013 11  6 - 23 mg/dL Final  . Creatinine, Ser 08/13/2013 0.87  0.50 - 1.10 mg/dL Final  . Calcium 08/13/2013 9.6  8.4 - 10.5 mg/dL Final  . Total Protein 08/13/2013 7.5  6.0 - 8.3 g/dL Final  . Albumin 08/13/2013 3.7  3.5 - 5.2 g/dL Final  . AST 08/13/2013 14  0 - 37 U/L Final  . ALT 08/13/2013 13  0 - 35 U/L Final  . Alkaline Phosphatase 08/13/2013 117  39 - 117 U/L Final  . Total Bilirubin 08/13/2013 0.2* 0.3 - 1.2 mg/dL Final  . GFR calc non Af Amer 08/13/2013 81* >90 mL/min Final  . GFR calc Af Amer 08/13/2013 >90  >90 mL/min Final   Comment: (NOTE)                          The eGFR has been calculated using the CKD EPI equation.                          This calculation has not been validated in all clinical situations.                          eGFR's persistently <90 mL/min signify possible Chronic Kidney                          Disease.  . CEA 08/13/2013 <0.5  0.0 - 5.0 ng/mL Final   Performed at [facility]  . CA 27.29 08/13/2013 40* 0 - 39 U/mL Final   Performed at [facility]    PATHOLOGY:no new pathology.  Urinalysis    Component Value Date/Time   NITRITE neg 07/18/2013 1846   LEUKOCYTESUR Negative 07/18/2013 1846    RADIOGRAPHIC STUDIES: MM Digital Diagnostic Bilat Status: Final result            Study Result    *RADIOLOGY REPORT*  Clinical Data: Patient with history of left breast lumpectomy,  radiotherapy and chemotherapy.  DIGITAL DIAGNOSTIC BILATERAL MAMMOGRAM WITH CAD  Comparison: Prior examinations dating back to 01/01/2011.  Findings:  ACR Breast Density Category c: The breast tissue is  heterogeneously dense, which may obscure small masses.  Stable post lumpectomy and radiation changes within the left  breast. Interval development of density within the upper inner  aspect of the right breast posteriorly which corresponds with  scarring from prior port. No concerning masses, calcifications or  nonsurgical architectural distortion.  Mammographic images were processed with CAD.  IMPRESSION:  No mammographic evidence for malignancy. Stable post lumpectomy  changes could  RECOMMENDATION:  Bilateral diagnostic mammography in 1 year.  I have discussed the findings and recommendations with the patient.  Results were also provided in writing at the conclusion of the  visit. If applicable, a reminder letter will be sent to the  patient regarding her next appointment.  BI-RADS CATEGORY 2: Benign finding(s).  Original Report Authenticated By: Lovey Newcomer, M.D       ASSESSMENT:  #1.Stage III triple negative breast cancer on the left side with a 7- 7.6 cm mass at presentation upper outer quadrant left breast,  also  a palpable mass in the left axilla that was quite large. She was treated with dose dense FEC followed by dose dense Taxotere. She  had cycle 4 of FEC on 09/14/2011 and completed cycle 4 of dose dense Taxotere on 11/23/2011. She then  went on to have surgery by  Dr. Osborn Coho on 12/21/2011. She was still found to have a 1 cm high-grade cancer with 3 of 7 positive nodes. LVI was not seen.  She had macrometastasis 1.0 cm in one lymph node, 4 mm in another node and she had isolated tumor cells in a third node.  Extracapsular extension was seen. She receive radiation therapy also.  #2. Resumption of menstrual periods in October 2013. #3. BRCA1 and BRCA2 negative.    PLAN:  #1. Be more judicious and wearing left upper extremity lymphedema sleeve. #2. Continue self breast examination monthly. #3. Followup in 6 months with office visit and lab tests. Next mammogram is due in September 2015.  .   All questions were answered. The patient knows to call the clinic with any problems, questions or concerns. We can certainly see the patient much sooner if necessary.   I spent 25 minutes counseling the patient face to face. The total time spent in the appointment was 30 minutes.    Doroteo Bradford, MD 11/12/2013 3:30 PM

## 2013-11-12 NOTE — Progress Notes (Signed)
Jessica Simpson presented for labwork. Labs per MD order drawn via Peripheral Line 23 gauge needle inserted in rt ac Good blood return present. Procedure without incident.  Needle removed intact. Patient tolerated procedure well.

## 2013-11-12 NOTE — Patient Instructions (Signed)
[place] Discharge Instructions  RECOMMENDATIONS MADE BY THE CONSULTANT AND ANY TEST RESULTS WILL BE SENT TO YOUR REFERRING PHYSICIAN.  EXAM FINDINGS BY THE PHYSICIAN TODAY AND SIGNS OR SYMPTOMS TO REPORT TO CLINIC OR PRIMARY PHYSICIAN:   Return in 6 months to see MD  We will draw labs in 6 months also   Thank you for choosing [facility] to provide your oncology and hematology care.  To afford each patient quality time with our providers, please arrive at least 15 minutes before your scheduled appointment time.  With your help, our goal is to use those 15 minutes to complete the necessary work-up to ensure our physicians have the information they need to help with your evaluation and healthcare recommendations.    Effective January 1st, 2014, we ask that you re-schedule your appointment with our physicians should you arrive 10 or more minutes late for your appointment.  We strive to give you quality time with our providers, and arriving late affects you and other patients whose appointments are after yours.    Again, thank you for choosing [facility].  Our hope is that these requests will decrease the amount of time that you wait before being seen by our physicians.       _____________________________________________________________  Should you have questions after your visit to [name], please contact our office at [phone] between the hours of 8:30 a.m. and 5:00 p.m.  Voicemails left after 4:30 p.m. will not be returned until the following business day.  For prescription refill requests, have your pharmacy contact our office with your prescription refill request.

## 2013-11-13 LAB — CEA: CEA: <0.5 ng/mL (ref 0.0–5.0)

## 2013-11-13 LAB — CANCER ANTIGEN 27.29: CA 27.29: 42 U/mL — AB (ref 0–39)

## 2013-11-29 ENCOUNTER — Ambulatory Visit: Payer: Self-pay | Admitting: Radiation Oncology

## 2013-11-30 ENCOUNTER — Ambulatory Visit: Payer: 59 | Admitting: Radiation Oncology

## 2013-12-13 ENCOUNTER — Encounter: Payer: Self-pay | Admitting: Radiation Oncology

## 2013-12-14 ENCOUNTER — Ambulatory Visit
Admission: RE | Admit: 2013-12-14 | Discharge: 2013-12-14 | Disposition: A | Discharge status: Home / Self Care | Payer: 59 | Source: Ambulatory Visit | Attending: Radiation Oncology | Admitting: Radiation Oncology

## 2013-12-14 ENCOUNTER — Encounter: Payer: Self-pay | Admitting: Radiation Oncology

## 2013-12-14 VITALS — BP 168/104 | HR 69 | Temp 97.8°F | Ht 66.0 in | Wt 225.7 lb

## 2013-12-14 DIAGNOSIS — C50912 Malignant neoplasm of unspecified site of left female breast: Secondary | ICD-10-CM

## 2013-12-14 NOTE — Progress Notes (Signed)
Jessica Simpson here today s/p radiation to the left breast.  She denies any pain. When questioned about any fatigue, she stated "I have great days, then some bad days."  Presently has a cough/ cold  X 2 weeks and just completed a course of Amoxicillin.  She has also been taking Cough/cold medicine and  Now has an elevated BP.  She notes fading of her tan and the area of roughness that she noted in the upper portion of her breast is now soft.

## 2013-12-14 NOTE — Progress Notes (Signed)
  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 N1a. M0   Interval Since Last Radiation: She completed a total dose of 60 Gray in 30 fractions on 02/29/2012. She receive 50 gray in 25 fractions to the left breast, supraclavicular, and axillary regions. She then received a boost to the left lumpectomy of 10 gray in 5 fractions. This was given at [facility].   Narrative:  The patient returns today for routine follow-up.  No new complaints. No new aches or pains.                        She had a BIRADS 2 Mammogram in Sept 2014.  ALLERGIES:  is allergic to codeine.  Meds: Current Outpatient Prescriptions  Medication Sig Dispense Refill  . acetaminophen (TYLENOL) 500 MG tablet Take 500-1,000 mg by mouth every 6 (six) hours as needed. For pain      . fluticasone (FLONASE) 50 MCG/ACT nasal spray 2 sprays each nostril for 14 days then daily as needed.  1 g  0   No current facility-administered medications for this encounter.   Facility-Administered Medications Ordered in Other Encounters  Medication Dose Route Frequency Provider Last Rate Last Dose  . lactated ringers infusion    Continuous PRN Kerby Less, CRNA        Physical Findings: The patient is in no acute distress. Patient is alert and oriented.  height is 5\' 6"  (1.676 m) and weight is 225 lb 11.2 oz (102.377 kg). Her temperature is 97.8 F (36.6 C). Her blood pressure is 168/104 and her pulse is 69. Marland Kitchen   No palpable adenopathy in SCV, cervical or axillary regions. B/l breasts unremarkable   Lab Findings: Lab Results  Component Value Date   WBC 6.3 11/12/2013   HGB 12.6 11/12/2013   HCT 38.2 11/12/2013     MCV 79.9 11/12/2013   PLT 300 11/12/2013    Radiographic Findings: As above  Impression/Plan: Doing well. Pt inquires as to whether there is any utility to get imaging in [place] rather than [place] for annual mammography. I emailed a radiologist to inquire about this. I will see her for her next f/u in 1 year, sooner if needed.  I spent 15 minutes face to face with the patient and more than 50% of that time was spent in counseling and/or coordination of care. _____________________________________   Eppie Gibson, MD

## 2014-05-13 ENCOUNTER — Other Ambulatory Visit (HOSPITAL_COMMUNITY): Payer: 59

## 2014-05-13 ENCOUNTER — Ambulatory Visit (HOSPITAL_COMMUNITY): Payer: 59

## 2014-05-15 ENCOUNTER — Encounter (HOSPITAL_COMMUNITY): Payer: 59 | Attending: Hematology

## 2014-05-15 ENCOUNTER — Encounter (HOSPITAL_BASED_OUTPATIENT_CLINIC_OR_DEPARTMENT_OTHER): Payer: 59

## 2014-05-15 ENCOUNTER — Encounter (HOSPITAL_COMMUNITY): Payer: Self-pay

## 2014-05-15 VITALS — BP 190/93 | HR 75 | Temp 98.4°F | Resp 16 | Wt 224.1 lb

## 2014-05-15 DIAGNOSIS — C50912 Malignant neoplasm of unspecified site of left female breast: Secondary | ICD-10-CM

## 2014-05-15 DIAGNOSIS — Z9221 Personal history of antineoplastic chemotherapy: Secondary | ICD-10-CM | POA: Diagnosis not present

## 2014-05-15 DIAGNOSIS — I10 Essential (primary) hypertension: Secondary | ICD-10-CM | POA: Diagnosis not present

## 2014-05-15 DIAGNOSIS — Z923 Personal history of irradiation: Secondary | ICD-10-CM | POA: Diagnosis not present

## 2014-05-15 DIAGNOSIS — N921 Excessive and frequent menstruation with irregular cycle: Secondary | ICD-10-CM | POA: Diagnosis not present

## 2014-05-15 DIAGNOSIS — C50919 Malignant neoplasm of unspecified site of unspecified female breast: Secondary | ICD-10-CM

## 2014-05-15 DIAGNOSIS — Z853 Personal history of malignant neoplasm of breast: Secondary | ICD-10-CM | POA: Insufficient documentation

## 2014-05-15 DIAGNOSIS — Z9010 Acquired absence of unspecified breast and nipple: Secondary | ICD-10-CM | POA: Insufficient documentation

## 2014-05-15 LAB — COMPREHENSIVE METABOLIC PANEL
ALBUMIN: 3.8 g/dL (ref 3.5–5.2)
ALT: 13 U/L (ref 0–35)
ANION GAP: 12 (ref 5–15)
AST: 13 U/L (ref 0–37)
Alkaline Phosphatase: 104 U/L (ref 39–117)
BUN: 11 mg/dL (ref 6–23)
CO2: 27 mEq/L (ref 19–32)
CREATININE: 0.83 mg/dL (ref 0.50–1.10)
Calcium: 9 mg/dL (ref 8.4–10.5)
Chloride: 99 mEq/L (ref 96–112)
GFR calc Af Amer: >90 mL/min (ref >90)
GFR, EST NON AFRICAN AMERICAN: 85 mL/min — AB (ref >90)
Glucose, Bld: 138 mg/dL — ABNORMAL HIGH (ref 70–99)
Potassium: 3.3 mEq/L — ABNORMAL LOW (ref 3.7–5.3)
Sodium: 138 mEq/L (ref 137–147)
Total Bilirubin: <0.2 mg/dL — ABNORMAL LOW (ref 0.3–1.2)
Total Protein: 7.2 g/dL (ref 6.0–8.3)

## 2014-05-15 LAB — CBC WITH DIFFERENTIAL/PLATELET
BASOS PCT: 0 % (ref 0–1)
Basophils Absolute: 0 10*3/uL (ref 0.0–0.1)
Eosinophils Absolute: 0.1 10*3/uL (ref 0.0–0.7)
Eosinophils Relative: 2 % (ref 0–5)
HEMATOCRIT: 35.7 % — AB (ref 36.0–46.0)
HEMOGLOBIN: 11.7 g/dL — AB (ref 12.0–15.0)
Lymphocytes Relative: 28 % (ref 12–46)
Lymphs Abs: 1.8 10*3/uL (ref 0.7–4.0)
MCH: 26.2 pg (ref 26.0–34.0)
MCHC: 32.8 g/dL (ref 30.0–36.0)
MCV: 79.9 fL (ref 78.0–100.0)
MONO ABS: 0.3 10*3/uL (ref 0.1–1.0)
MONOS PCT: 5 % (ref 3–12)
NEUTROS ABS: 4 10*3/uL (ref 1.7–7.7)
Neutrophils Relative %: 65 % (ref 43–77)
Platelets: 288 10*3/uL (ref 150–400)
RBC: 4.47 MIL/uL (ref 3.87–5.11)
RDW: 13.5 % (ref 11.5–15.5)
WBC: 6.2 10*3/uL (ref 4.0–10.5)

## 2014-05-15 MED ORDER — CLONIDINE HCL 0.1 MG PO TABS: 0.1000 mg | ORAL_TABLET | Freq: Two times a day (BID) | ORAL | Status: DC

## 2014-05-15 NOTE — Progress Notes (Signed)
Jessica Simpson presented for labwork. Labs per MD order drawn via Peripheral Line 23 gauge needle inserted in right AC  Good blood return present. Procedure without incident.  Needle removed intact. Patient tolerated procedure well.

## 2014-05-15 NOTE — Patient Instructions (Signed)
[facility] Discharge Instructions  RECOMMENDATIONS MADE BY THE CONSULTANT AND ANY TEST RESULTS WILL BE SENT TO YOUR REFERRING PHYSICIAN.  EXAM FINDINGS BY THE PHYSICIAN TODAY AND SIGNS OR SYMPTOMS TO REPORT TO CLINIC OR PRIMARY PHYSICIAN: Exam and findings as discussed by Dr. Bubba Hales.  We will check some labs today and will make a referral to GI (Dr. Laural Golden) for the rectal bleeding that you noted.  If there are any issue with your blood work we will contact you. Report any new lumps, bone pain, shortness of breath or other symptoms.  MEDICATIONS PRESCRIBED:  Catapress(clonidine)0.1mg  - take twice daily  INSTRUCTIONS/FOLLOW-UP: Follow-up in 4 months.  Thank you for choosing [facility] to provide your oncology and hematology care.  To afford each patient quality time with our providers, please arrive at least 15 minutes before your scheduled appointment time.  With your help, our goal is to use those 15 minutes to complete the necessary work-up to ensure our physicians have the information they need to help with your evaluation and healthcare recommendations.    Effective January 1st, 2014, we ask that you re-schedule your appointment with our physicians should you arrive 10 or more minutes late for your appointment.  We strive to give you quality time with our providers, and arriving late affects you and other patients whose appointments are after yours.    Again, thank you for choosing [facility].  Our hope is that these requests will decrease the amount of time that you wait before being seen by our physicians.       _____________________________________________________________  Should you have questions after your visit to [facility], please contact our office at [phone] between the hours of 8:30 a.m. and 4:30 p.m.  Voicemails left after 4:30 p.m. will not be returned until the following business day.  For prescription  refill requests, have your pharmacy contact our office with your prescription refill request.    _______________________________________________________________  We hope that we have given you very good care.  You may receive a patient satisfaction survey in the mail, please complete it and return it as soon as possible.  We value your feedback!  _______________________________________________________________  Have you asked about our STAR program?  STAR stands for Survivorship Training and Rehabilitation, and this is a nationally recognized cancer care program that focuses on survivorship and rehabilitation.  Cancer and cancer treatments may cause problems, such as, pain, making you feel tired and keeping you from doing the things that you need or want to do. Cancer rehabilitation can help. Our goal is to reduce these troubling effects and help you have the best quality of life possible.  You may receive a survey from a nurse that asks questions about your current state of health.  Based on the survey results, all eligible patients will be referred to the [facility] program for an evaluation so we can better serve you!  A frequently asked questions sheet is available upon request.

## 2014-05-16 LAB — T4, FREE: FREE T4: 1.1 ng/dL (ref 0.80–1.80)

## 2014-05-16 LAB — TSH: TSH: 1.18 u[IU]/mL (ref 0.350–4.500)

## 2014-05-16 LAB — CANCER ANTIGEN 27.29: CA 27.29: 37 U/mL (ref 0–39)

## 2014-05-16 LAB — CEA

## 2014-05-16 NOTE — Progress Notes (Signed)
[facility] OFFICE PROGRESS NOTE  PCP Default, Provider, MD No address on file  DIAGNOSIS: Breast cancer, IDC, Left, Stage III, Triple negative - Plan: TSH, T4, free, TSH, T4, free  CURRENT THERAPY: Watchful waiting   INTERVAL HEMATOLOGY/ONCOLOGY HX:  Jessica Simpson 43 y.o. female returns for followup of stage III left breast cancer, status post neoadjuvant chemotherapy followed by modified radical mastectomy at which time 1 cm high-grade cancer was still present along with metastases to 3 of 7 lymph nodes. Extracapsular extension was noted and she received external beam radiotherapy. Chemotherapy-induced neutropenia; She does experience lymphedema for which she uses a compression sleeve.  Her cancer was triple negative breast cancer on the left side with a 7- 7.6 cm mass at presentation upper outer quadrant left breast, also a palpable mass in the left axilla that was quite large. She received neoadjuvant chemotherapy with dose dense FEC followed by dose dense Taxotere. She had 4 cycles of FEC on 09/14/2011 and completed cycle 4 of dose dense Taxotere on 11/23/2011. She then went on to have surgery by Dr. Osborn Coho on 12/21/2011. She was still found to have a 1 cm high-grade cancer with 3 of 7 positive nodes. LVI was not seen. 1.0 cm in one lymph node, 4 mm in another node and she had isolated tumor cells in a third node. Extracapsular extension was seen. BRCA1 and BRCA2 negative.    MEDICAL HISTORY:  Past Medical History  Diagnosis Date  . BRCA1 negative 10/22/2011  . BRCA2 negative 10/22/2011  . Breast cancer, IDC, Left, Stage III, Triple negative 06/30/2011  . Lymphedema of arm   . Lymphedema 06/15/2012  . S/P radiation therapy     50 gray in 25 fractions to the left breast, supraclavicular, and axillary regions. She then received a boost to the left lumpectomy of 10 gray in 5 fractions. This was given at [facility].  . Status post chemotherapy Comp. 11/23/11    FEC and Taxotere  . Fracture     right 4th toe    has Breast cancer, IDC, Left, Stage III, Triple negative; Chemotherapy-induced neutropenia; BRCA1 negative; BRCA2 negative; and Lymphedema on her problem list.    SURGICAL HISTORY:  Past Surgical History   Procedure  Laterality  Date   .  Removal breast mass   2004     benign   .  Left breast biopsy with axillary biopsy       core bx   .  Portacath placement   08/02/2011     dr Margot Chimes   .  Tumor removal       on left hand   .  Tumor removal       rt. eye   .  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  Breast surgery     .  Incision and drainage abscess anal       x3   .  Port-a-cath removal   12/21/2011     Procedure: REMOVAL PORT-A-CATH; Surgeon: Haywood Lasso, MD; Location: [facility]; Service: General; Laterality: N/A;   .  Evacuation breast hematoma   12/21/2011     Procedure: EVACUATION HEMATOMA BREAST; Surgeon: Stark Klein, MD; Location: [facility]; Service: General; Laterality: Left;   .  Evacuation breast hematoma   12/21/2011     Procedure: EVACUATION HEMATOMA BREAST; Surgeon: Stark Klein, MD;  [facility]; Service: General; Laterality: Left;   .  Irrigation and debridement hematoma   12/21/2011     Procedure: IRRIGATION AND DEBRIDEMENT HEMATOMA; Surgeon: Stark Klein, MD; Location: [facility]; Service: General; Laterality: Left;   .  Hand surgery       tumor on finger, left hand   .  Cesarean section        ALLERGIES:  is allergic to codeine.  MEDICATIONS: has a current medication list which includes the following prescription(s): acetaminophen, fluticasone, and clonidine, and the following Facility-Administered Medications: lactated ringers.  FAMILY HISTORY: family history includes Cancer in her maternal grandmother; Cancer (age of onset: 47) in her father.  REVIEW OF SYSTEMS:    SINCE YOUR LAST VISIT Been diagnosed  or treated for a new medical /surgical  problem or condition: No Any Recent Xrays or studies performed: No Any new prescription or OTC medications: No ECOG Perf Status: Fully active, able to carry on all pre-disease performance without restriction Problems sleeping:  (sometimes) Medications taken to help sleep: No How is your appetie: 100% normal Any Supplements: No Any trouble chewing or swallowing: No Any Nausea or Vomiting: No Any Bowel problems: Yes (has noted small amt. bright blood when wipes after BM x 2) # Bowel Movements per week: 7 Any Urinary Issues: No Any Cardiac Problems: No Any Respiratory Issues: No Any Neurological Issues: No Do you live alone: No Feelings hopelessness: No You or your family have any concerns or Health changes: Yes Pain Assessment Pain Score: 0-No pain  Hypertensive when she comes to clinic appointments without associated symptoms. Other than that discussed above is noncontributory.    PHYSICAL EXAMINATION:   weight is 224 lb 1.6 oz (101.651 kg). Her temperature is 98.4 F (36.9 C). Her blood pressure is 190/93 and her pulse is 75. Her respiration is 16.    GENERAL: alert, no distress and comfortable SKIN: skin color, texture, turgor are normal, no rashes or significant lesions. No cyanosis of digits and no clubbing.  EYES: PERLA; Conjunctiva are pink and non-injected, sclera clear OROPHARYNX: no exudate, no erythema on lips, buccal mucosa, or tongue. NECK: supple, thyroid normal size, non-tender, without nodularity. No masses LYMPH:  no palpable lymphadenopathy in the cervical, axillary or inguinal LUNGS: clear to auscultation and percussion with normal breathing effort,  HEART: regular rate & rhythm and no murmurs.  BREASTS: S/P left breast lumpectomy with no masses or suspicious skin changes. ABDOMEN: abdomen soft, non-tender and normal bowel sounds, no organomegaly. MUSCULOSKELETAL/EXTREMITIES: Range of motion normal. No spine or CVA  tenderness. Lymphedema is present in left arm NEURO: alert & oriented x 3 with fluent speech, no focal motor/sensory deficits   LABORATORY DATA: Lab on 05/15/2014  Component Date Value Ref Range Status  . WBC 05/15/2014 6.2  4.0 - 10.5 K/uL Final  . RBC 05/15/2014 4.47  3.87 - 5.11 MIL/uL Final  . Hemoglobin 05/15/2014 11.7* 12.0 - 15.0 g/dL Final  . HCT 05/15/2014 35.7* 36.0 - 46.0 % Final  . MCV 05/15/2014 79.9  78.0 - 100.0 fL Final  . MCH 05/15/2014 26.2  26.0 - 34.0 pg Final  . MCHC 05/15/2014 32.8  30.0 - 36.0 g/dL Final  . RDW 05/15/2014 13.5  11.5 - 15.5 % Final  . Platelets 05/15/2014 288  150 - 400 K/uL Final  . Neutrophils Relative % 05/15/2014 65  43 - 77 % Final  . Neutro Abs 05/15/2014 4.0  1.7 - 7.7 K/uL Final  . Lymphocytes Relative 05/15/2014  28  12 - 46 % Final  . Lymphs Abs 05/15/2014 1.8  0.7 - 4.0 K/uL Final  . Monocytes Relative 05/15/2014 5  3 - 12 % Final  . Monocytes Absolute 05/15/2014 0.3  0.1 - 1.0 K/uL Final  . Eosinophils Relative 05/15/2014 2  0 - 5 % Final  . Eosinophils Absolute 05/15/2014 0.1  0.0 - 0.7 K/uL Final  . Basophils Relative 05/15/2014 0  0 - 1 % Final  . Basophils Absolute 05/15/2014 0.0  0.0 - 0.1 K/uL Final  . Sodium 05/15/2014 138  137 - 147 mEq/L Final  . Potassium 05/15/2014 3.3* 3.7 - 5.3 mEq/L Final  . Chloride 05/15/2014 99  96 - 112 mEq/L Final  . CO2 05/15/2014 27  19 - 32 mEq/L Final  . Glucose, Bld 05/15/2014 138* 70 - 99 mg/dL Final  . BUN 05/15/2014 11  6 - 23 mg/dL Final  . Creatinine, Ser 05/15/2014 0.83  0.50 - 1.10 mg/dL Final  . Calcium 05/15/2014 9.0  8.4 - 10.5 mg/dL Final  . Total Protein 05/15/2014 7.2  6.0 - 8.3 g/dL Final  . Albumin 05/15/2014 3.8  3.5 - 5.2 g/dL Final  . AST 05/15/2014 13  0 - 37 U/L Final  . ALT 05/15/2014 13  0 - 35 U/L Final  . Alkaline Phosphatase 05/15/2014 104  39 - 117 U/L Final  . Total Bilirubin 05/15/2014 <0.2* 0.3 - 1.2 mg/dL Final  . GFR calc non Af Amer 05/15/2014 85* >90  mL/min Final  . GFR calc Af Amer 05/15/2014 >90  >90 mL/min Final   Comment: (NOTE)                          The eGFR has been calculated using the CKD EPI equation.                          This calculation has not been validated in all clinical situations.                          eGFR's persistently <90 mL/min signify possible Chronic Kidney                          Disease.  . Anion gap 05/15/2014 12  5 - 15 Final  . CEA 05/15/2014 <0.5  0.0 - 5.0 ng/mL Final   Performed at [facility]  . CA 27.29 05/15/2014 37  0 - 39 U/mL Final   Performed at [facility] Visit on 05/15/2014  Component Date Value Ref Range Status  . TSH 05/15/2014 1.180  0.350 - 4.500 uIU/mL Final   Performed at [facility]  . Free T4 05/15/2014 1.10  0.80 - 1.80 ng/dL Final   Performed at [facility]: No new results   RADIOGRAPHIC STUDIES: No resulClinical Data: Patient with history of left breast lumpectomy,  radiotherapy and chemotherapy.  DIGITAL DIAGNOSTIC BILATERAL MAMMOGRAM WITH CAD  Comparison: Prior examinations dating back to 01/01/2011.  Findings:  ACR Breast Density Category c: The breast tissue is  heterogeneously dense, which may obscure small masses.  Stable post lumpectomy and radiation changes within the left  breast. Interval development of density within the upper inner  aspect of the right breast posteriorly which corresponds with  scarring from prior port. No concerning  masses, calcifications or  nonsurgical architectural distortion.  Mammographic images were processed with CAD.  IMPRESSION:  No mammographic evidence for malignancy. Stable post lumpectomy  changes could  RECOMMENDATION:  Bilateral diagnostic mammography in 1 year.  I have discussed the findings and recommendations with the patient.  Results were also provided in writing at the conclusion of the  visit. If applicable, a reminder letter will be sent to the    patient regarding her next appointment.  BI-RADS CATEGORY 2: Benign finding(s).  Original Report Authenticated By: Lovey Newcomer, M.D   ASSESSMENT:  #1.Stage III triple negative breast cancer on the left side with a 7- 7.6 cm mass at presentation upper outer quadrant left breast, also  a palpable mass in the left axilla that was quite large. She was treated with dose dense FEC followed by dose dense Taxotere. She  had cycle 4 of FEC on 09/14/2011 and completed cycle 4 of dose dense Taxotere on 11/23/2011. She then went on to have surgery by  Dr. Osborn Coho on 12/21/2011. She was still found to have a 1 cm high-grade cancer with 3 of 7 positive nodes. LVI was not seen.  She had macrometastasis 1.0 cm in one lymph node, 4 mm in another node and she had isolated tumor cells in a third node.  Extracapsular extension was seen. She receive radiation therapy also.  #2. Resumption of menstrual periods in October 2013.  #3. Hypertension #4  Rectal spotting  RECOMMENDATIONS:  Repeat mammogram is scheduled for September. Prescribed catapres 0.37m twice daily until seen by PCP. Advised to schedule appointment with gastroenterologist regarding intermittently finding blood on toilet paper without frank bloody stools and a history of prior anorectal procedures.         All questions were answered. The patient knows to call the clinic with any problems, questions or concerns. We can certainly see the patient much sooner if necessary.    HDarrall Dears MD 05/16/2014 9:20 PM  .

## 2014-06-10 ENCOUNTER — Encounter (INDEPENDENT_AMBULATORY_CARE_PROVIDER_SITE_OTHER): Payer: Self-pay | Admitting: *Deleted

## 2014-07-10 ENCOUNTER — Telehealth (INDEPENDENT_AMBULATORY_CARE_PROVIDER_SITE_OTHER): Payer: Self-pay | Admitting: *Deleted

## 2014-07-10 ENCOUNTER — Ambulatory Visit (INDEPENDENT_AMBULATORY_CARE_PROVIDER_SITE_OTHER): Payer: 59 | Admitting: Internal Medicine

## 2014-07-10 ENCOUNTER — Encounter (INDEPENDENT_AMBULATORY_CARE_PROVIDER_SITE_OTHER): Payer: Self-pay | Admitting: Internal Medicine

## 2014-07-10 VITALS — BP 132/50 | HR 75 | Temp 97.5°F | Ht 65.0 in | Wt 223.2 lb

## 2014-07-10 DIAGNOSIS — Z1211 Encounter for screening for malignant neoplasm of colon: Secondary | ICD-10-CM

## 2014-07-10 DIAGNOSIS — K625 Hemorrhage of anus and rectum: Secondary | ICD-10-CM

## 2014-07-10 NOTE — Progress Notes (Signed)
Subjective:    Patient ID: Jessica Simpson, female    DOB: [birth year], 43 y.o.   MRN: [id number]  HPI Referred to our office by the [facility] at [facility] for rectal bleeding. Patient tells me in April and May she saw blood when she wiped. She thinks her stools may have been hard and once she had been having frequent stools. Patient is very concerned since she has a hx of breast cancer. She has not seen any further rectal bleeding since may.   Hx of locally advanced left breast cancer diagnosed 2012, status post lumpectomy. She underwent chemo followed by definitive surgery and axillary node dissection.  She also underwent radiotherapy. Patient is in remission.  Appetite is good. No unintentional weight loss. No abdominal pain. She does have flatus. She has BM daily. No melena or BRRB recently.  CBC    Component Value Date/Time   WBC 6.2 05/15/2014 1358   RBC 4.47 05/15/2014 1358   HGB 11.7* 05/15/2014 1358   HCT 35.7* 05/15/2014 1358   PLT 288 05/15/2014 1358   MCV 79.9 05/15/2014 1358   MCH 26.2 05/15/2014 1358   MCHC 32.8 05/15/2014 1358   RDW 13.5 05/15/2014 1358   LYMPHSABS 1.8 05/15/2014 1358   MONOABS 0.3 05/15/2014 1358   EOSABS 0.1 05/15/2014 1358   BASOSABS 0.0 05/15/2014 1358      INTERVAL HISTORY:    Review of Systems Past Medical History  Diagnosis Date  . BRCA1 negative 10/22/2011  . BRCA2 negative 10/22/2011  . Breast cancer, IDC, Left, Stage III, Triple negative 06/30/2011  . Lymphedema of arm   . Lymphedema 06/15/2012  . S/P radiation therapy     50 gray in 25 fractions to the left breast, supraclavicular, and axillary regions. She then received a boost to the left lumpectomy of 10 gray in 5 fractions. This was given at [facility].  . Status post chemotherapy Comp. 11/23/11    FEC and Taxotere  . Fracture     right 4th toe    Past Surgical History  Procedure Laterality Date  . Removal breast mass  2004    benign  . Left breast  biopsy with axillary biopsy      core bx  . Portacath placement  08/02/2011    dr Margot Chimes  . Tumor removal      on left hand  . Tumor removal      rt. eye  . 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 Breast surgery    . Incision and drainage abscess anal      x3  . Port-a-cath removal  12/21/2011    Procedure: REMOVAL PORT-A-CATH;  Surgeon: Haywood Lasso, MD;  Location: [facility];  Service: General;  Laterality: N/A;  . Evacuation breast hematoma  12/21/2011    Procedure: EVACUATION HEMATOMA BREAST;  Surgeon: Stark Klein, MD;  Location: [facility];  Service: General;  Laterality: Left;  . Evacuation breast hematoma  12/21/2011    Procedure: EVACUATION HEMATOMA BREAST;  Surgeon: Stark Klein, MD;  Location: [facility];  Service: General;  Laterality: Left;  . Irrigation and debridement hematoma  12/21/2011    Procedure: IRRIGATION AND DEBRIDEMENT HEMATOMA;  Surgeon: Stark Klein, MD;  Location: [facility];  Service: General;  Laterality: Left;  . Hand surgery      tumor on finger, left hand  . Cesarean section      Allergies  Allergen Reactions  . Codeine     Nausea, vomiting, pain     Current Outpatient Prescriptions on File Prior to Visit  Medication Sig Dispense Refill  . acetaminophen (TYLENOL) 500 MG tablet Take 500-1,000 mg by mouth every 6 (six) hours as needed. For pain      . fluticasone (FLONASE) 50 MCG/ACT nasal spray 2 sprays each nostril for 14 days then daily as needed.  1 g  0   Current Facility-Administered Medications on File Prior to Visit  Medication Dose Route Frequency Provider Last Rate Last Dose  . lactated ringers infusion    Continuous PRN Kerby Less, CRNA            Objective:   Physical Exam  Filed Vitals:   07/10/14 1502  BP: 132/50  Pulse: 75  Temp: 97.5 F (36.4 C)  Height: _0  (1.651 m)  Weight: 223 lb 3.2 oz (101.243 kg)   Alert and oriented. Skin warm and dry. Oral mucosa is moist.   . Sclera  anicteric, conjunctivae is pink. Thyroid not enlarged. No cervical lymphadenopathy. Lungs clear. Heart regular rate and rhythm.  Abdomen is soft. Bowel sounds are positive. No hepatomegaly. No abdominal masses felt. No tenderness.  No edema to lower extremities.  Stool brown and guaiac positive.          Assessment & Plan:  Hx of rectal bleeding. Hx of breast cancer in remission. Colonic neoplasm, polyp, AVM, hemorrhoid needs to be ruled out. Colonoscopy

## 2014-07-10 NOTE — Patient Instructions (Signed)
Colonoscopy.  The risks and benefits such as perforation, bleeding, and infection were reviewed with the patient and is agreeable. 

## 2014-07-10 NOTE — Telephone Encounter (Signed)
Patient needs movi prep 

## 2014-07-11 MED ORDER — PEG-KCL-NACL-NASULF-NA ASC-C 100 G PO SOLR: 1.0000 | Freq: Once | ORAL | Status: DC

## 2014-07-22 ENCOUNTER — Other Ambulatory Visit (HOSPITAL_COMMUNITY)
Admission: RE | Admit: 2014-07-22 | Discharge: 2014-07-22 | Disposition: A | Discharge status: Home / Self Care | Payer: 59 | Source: Ambulatory Visit | Attending: Adult Health | Admitting: Adult Health

## 2014-07-22 ENCOUNTER — Encounter: Payer: Self-pay | Admitting: Adult Health

## 2014-07-22 ENCOUNTER — Ambulatory Visit (INDEPENDENT_AMBULATORY_CARE_PROVIDER_SITE_OTHER): Payer: 59 | Admitting: Adult Health

## 2014-07-22 VITALS — BP 178/88 | HR 76 | Ht 65.0 in | Wt 223.0 lb

## 2014-07-22 DIAGNOSIS — Z124 Encounter for screening for malignant neoplasm of cervix: Secondary | ICD-10-CM | POA: Diagnosis not present

## 2014-07-22 DIAGNOSIS — R195 Other fecal abnormalities: Secondary | ICD-10-CM

## 2014-07-22 DIAGNOSIS — Z1151 Encounter for screening for human papillomavirus (HPV): Secondary | ICD-10-CM | POA: Diagnosis present

## 2014-07-22 DIAGNOSIS — Z1212 Encounter for screening for malignant neoplasm of rectum: Secondary | ICD-10-CM

## 2014-07-22 DIAGNOSIS — Z01419 Encounter for gynecological examination (general) (routine) without abnormal findings: Secondary | ICD-10-CM

## 2014-07-22 DIAGNOSIS — Z853 Personal history of malignant neoplasm of breast: Secondary | ICD-10-CM

## 2014-07-22 HISTORY — DX: Other fecal abnormalities: R19.5

## 2014-07-22 HISTORY — DX: Personal history of malignant neoplasm of breast: Z85.3

## 2014-07-22 LAB — HEMOCCULT GUIAC POC 1CARD (OFFICE): Fecal Occult Blood, POC: POSITIVE

## 2014-07-22 NOTE — Patient Instructions (Signed)
Physical in 1 year Mammogram yearly Colonoscopy 10/21

## 2014-07-22 NOTE — Progress Notes (Signed)
Patient ID: Jessica Simpson, female   DOB: 07/12/1971, 43 y.o.   MRN: [id number] History of Present Illness:  Jessica Simpson is a 43 year old black female in for pap and physical.She has had rectal bleeding and is having colonoscopy 10/21 with Dr Laural Golden.  Current Medications, Allergies, Past Medical History, Past Surgical History, Family History and Social History were reviewed in [employer] record.     Review of Systems: Patient denies any daily headaches, blurred vision, shortness of breath, chest pain, abdominal pain, problems with bowel movements, urination, or intercourse.Not having sex at present, no joint pain or mood swings.    Physical Exam:BP 178/88  Pulse 76  Ht 5\' 5"  (1.651 m)  Wt 223 lb (101.152 kg)  BMI 37.11 kg/m2  LMP 07/18/2014 General:  Well developed, well nourished, no acute distress Skin:  Warm and dry Neck:  Midline trachea, normal thyroid Lungs; Clear to auscultation bilaterally Breast:  No dominant palpable mass, retraction, or nipple discharge, has well healed scar left breast Cardiovascular: Regular rate and rhythm Abdomen:  Soft, non tender, no hepatosplenomegaly Pelvic:  External genitalia is normal in appearance.  The vagina is normal in appearance.  The cervix is bulbous.Pap with HPV performed.  Uterus is felt to be normal size, shape, and contour.  No                adnexal masses or tenderness noted. Rectal: Good sphincter tone, no polyps, or hemorrhoids felt.  Hemoccult positive. Extremities:  No swelling or varicosities noted Psych:  No mood changes, alert and cooperative,seems happy Had 2 periods in September the first and 24 th discussed with her that normal cycles vary between 21 to 35 days.   Impression: Yearly gyn exam  Positive hemoccult History of breast cancer    Plan: Physical in 1 year Mammogram yearly Get colonoscopy 10/21 Labs with PCP Call prn

## 2014-07-24 LAB — CYTOLOGY - PAP

## 2014-08-12 ENCOUNTER — Other Ambulatory Visit (HOSPITAL_COMMUNITY): Payer: Self-pay | Admitting: *Deleted

## 2014-08-12 ENCOUNTER — Other Ambulatory Visit (HOSPITAL_COMMUNITY): Payer: Self-pay | Admitting: Oncology

## 2014-08-12 DIAGNOSIS — C50912 Malignant neoplasm of unspecified site of left female breast: Secondary | ICD-10-CM

## 2014-08-13 ENCOUNTER — Other Ambulatory Visit (INDEPENDENT_AMBULATORY_CARE_PROVIDER_SITE_OTHER): Payer: Self-pay | Admitting: *Deleted

## 2014-08-13 DIAGNOSIS — K625 Hemorrhage of anus and rectum: Secondary | ICD-10-CM

## 2014-08-14 ENCOUNTER — Encounter (HOSPITAL_COMMUNITY): Payer: Self-pay | Admitting: *Deleted

## 2014-08-14 ENCOUNTER — Ambulatory Visit (HOSPITAL_COMMUNITY)
Admission: RE | Admit: 2014-08-14 | Discharge: 2014-08-14 | Disposition: A | Discharge status: Home / Self Care | Payer: 59 | Source: Ambulatory Visit | Attending: Internal Medicine | Admitting: Internal Medicine

## 2014-08-14 ENCOUNTER — Encounter (HOSPITAL_COMMUNITY)
Admission: RE | Disposition: A | Discharge status: Home / Self Care | Payer: Self-pay | Source: Ambulatory Visit | Attending: Internal Medicine

## 2014-08-14 DIAGNOSIS — K921 Melena: Secondary | ICD-10-CM | POA: Diagnosis not present

## 2014-08-14 DIAGNOSIS — Z853 Personal history of malignant neoplasm of breast: Secondary | ICD-10-CM | POA: Diagnosis not present

## 2014-08-14 DIAGNOSIS — K644 Residual hemorrhoidal skin tags: Secondary | ICD-10-CM | POA: Insufficient documentation

## 2014-08-14 DIAGNOSIS — K625 Hemorrhage of anus and rectum: Secondary | ICD-10-CM

## 2014-08-14 DIAGNOSIS — Z9221 Personal history of antineoplastic chemotherapy: Secondary | ICD-10-CM | POA: Insufficient documentation

## 2014-08-14 DIAGNOSIS — K649 Unspecified hemorrhoids: Secondary | ICD-10-CM

## 2014-08-14 HISTORY — PX: COLONOSCOPY: SHX5424

## 2014-08-14 SURGERY — COLONOSCOPY
Anesthesia: Moderate Sedation

## 2014-08-14 MED ORDER — MEPERIDINE HCL 50 MG/ML IJ SOLN
INTRAMUSCULAR | Status: DC | PRN
  Administered 2014-08-14 (×2): 25 mg via INTRAVENOUS

## 2014-08-14 MED ORDER — MIDAZOLAM HCL 5 MG/5ML IJ SOLN
INTRAMUSCULAR | Status: AC
  Filled 2014-08-14: qty 10

## 2014-08-14 MED ORDER — MEPERIDINE HCL 50 MG/ML IJ SOLN
INTRAMUSCULAR | Status: AC
  Filled 2014-08-14: qty 1

## 2014-08-14 MED ORDER — STERILE WATER FOR IRRIGATION IR SOLN
Status: DC | PRN
  Administered 2014-08-14: 09:00:00

## 2014-08-14 MED ORDER — MIDAZOLAM HCL 5 MG/5ML IJ SOLN
INTRAMUSCULAR | Status: DC | PRN
  Administered 2014-08-14 (×3): 2 mg via INTRAVENOUS

## 2014-08-14 MED ORDER — SODIUM CHLORIDE 0.9 % IV SOLN
INTRAVENOUS | Status: DC
  Administered 2014-08-14: 09:00:00 via INTRAVENOUS

## 2014-08-14 NOTE — Discharge Instructions (Signed)
Resume usual medications and diet. No driving for 24 hours. Next colonoscopy in 10 years.   Colonoscopy, Care After These instructions give you information on caring for yourself after your procedure. Your doctor may also give you more specific instructions. Call your doctor if you have any problems or questions after your procedure. HOME CARE  Do not drive for 24 hours.  Do not sign important papers or use machinery for 24 hours.  You may shower.  You may go back to your usual activities, but go slower for the first 24 hours.  Take rest breaks often during the first 24 hours.  Walk around or use warm packs on your belly (abdomen) if you have belly cramping or gas.  Drink enough fluids to keep your pee (urine) clear or pale yellow.  Resume your normal diet. Avoid heavy or fried foods.  Avoid drinking alcohol for 24 hours or as told by your doctor.  Only take medicines as told by your doctor. If a tissue sample (biopsy) was taken during the procedure:   Do not take aspirin or blood thinners for 7 days, or as told by your doctor.  Do not drink alcohol for 7 days, or as told by your doctor.  Eat soft foods for the first 24 hours. GET HELP IF: You still have a small amount of blood in your poop (stool) 2-3 days after the procedure. GET HELP RIGHT AWAY IF:  You have more than a small amount of blood in your poop.  You see clumps of tissue (blood clots) in your poop.  Your belly is puffy (swollen).  You feel sick to your stomach (nauseous) or throw up (vomit).  You have a fever.  You have belly pain that gets worse and medicine does not help. MAKE SURE YOU:  Understand these instructions.  Will watch your condition.  Will get help right away if you are not doing well or get worse. Document Released: 11/13/2010 Document Revised: 10/16/2013 Document Reviewed: 06/18/2013 [facility] Patient Information 2015 [place], [place]. This information is not intended to replace  advice given to you by your health care provider. Make sure you discuss any questions you have with your health care provider.

## 2014-08-14 NOTE — Op Note (Addendum)
COLONOSCOPY PROCEDURE REPORT  PATIENT:  Jessica Simpson  MR#:  [id number] Birthdate:  [birth year], 43 y.o., female Endoscopist:  Dr. Rogene Houston, MD Referred By:  Dr. Farrel Gobble, MD  Procedure Date: 08/14/2014  Procedure:   Colonoscopy  Indications:  Patient is 43 year old African American female with personal history of breast carcinoma who presents with 2 episodes of hematochezia. She is undergoing diagnostic colonoscopy. Family history is negative for CRC.  Informed Consent:  The procedure and risks were reviewed with the patient and informed consent was obtained.  Medications:  Demerol 50 mg IV Versed 6 mg IV  Description of procedure:  After a digital rectal exam was performed, that colonoscope was advanced from the anus through the rectum and colon to the area of the cecum, ileocecal valve and appendiceal orifice. The cecum was deeply intubated. These structures were well-seen and photographed for the record. From the level of the cecum and ileocecal valve, the scope was slowly and cautiously withdrawn. The mucosal surfaces were carefully surveyed utilizing scope tip to flexion to facilitate fold flattening as needed. The scope was pulled down into the rectum where a thorough exam including retroflexion was performed. Terminal ileum was also examined.  Findings:   Prep satisfactory.  Normal mucosa of terminal ileum. Normal mucosa of cecum, ascending colon, hepatic flexure, transverse colon, splenic flexure, descending and sigmoid colon. Normal mucosa of rectum. Small hemorrhoids noted below the dentate line.    Therapeutic/Diagnostic Maneuvers Performed:  See above  Complications:  None  Cecal Withdrawal Time:  12 minutes  Impression:  Normal mucosa of terminal ileum. Normal colonoscopy except small external hemorrhoids.  Recommendations:  Standard instructions given. Next colonoscopy in 10 years.  Stephenie Navejas U  08/14/2014 9:53 AM   CC: Ms. Tonette Bihari, NP, [facility].

## 2014-08-14 NOTE — H&P (Signed)
Jessica Simpson is an 43 y.o. female.   Chief Complaint: Patient is here for colonoscopy. HPI: Patient is 43 year old African female who is here for diagnostic colonoscopy. She had 2 episodes of rectal recently when she noted bright red blood with bowel movement. She denies diarrhea and/or constipation although she thinks her stool my been hard and she had rectal bleeding. She denies abdominal pain anorexia weight loss. Had colonoscopy about 10 years ago elsewhere for perianal fistula it was negative for inflammatory bowel disease. Personal history is positive for breast CA and she remains in remission. Family history is negative for CRC.  Past Medical History  Diagnosis Date  . BRCA1 negative 10/22/2011  . BRCA2 negative 10/22/2011  . Breast cancer, IDC, Left, Stage III, Triple negative 06/30/2011  . Lymphedema of arm   . Lymphedema 06/15/2012  . S/P radiation therapy     50 gray in 25 fractions to the left breast, supraclavicular, and axillary regions. She then received a boost to the left lumpectomy of 10 gray in 5 fractions. This was given at [place].  . Status post chemotherapy Comp. 11/23/11    FEC and Taxotere  . Fracture     right 4th toe  . Positive fecal occult blood test 07/22/2014  . History of breast cancer 07/22/2014    Past Surgical History  Procedure Laterality Date  . Removal breast mass  2004    benign  . Left breast biopsy with axillary biopsy      core bx  . Portacath placement  08/02/2011    dr Margot Chimes  . Tumor removal      on left hand  . Tumor removal      rt. eye  . 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 Breast surgery    . Incision and drainage abscess anal      x3  . Port-a-cath removal  12/21/2011    Procedure: REMOVAL PORT-A-CATH;  Surgeon: Haywood Lasso, MD;  Location: [place];  Service: General;  Laterality: N/A;  . Evacuation breast hematoma  12/21/2011   Procedure: EVACUATION HEMATOMA BREAST;  Surgeon: Stark Klein, MD;  Location: [place];  Service: General;  Laterality: Left;  . Evacuation breast hematoma  12/21/2011    Procedure: EVACUATION HEMATOMA BREAST;  Surgeon: Stark Klein, MD;  Location: [place];  Service: General;  Laterality: Left;  . Irrigation and debridement hematoma  12/21/2011    Procedure: IRRIGATION AND DEBRIDEMENT HEMATOMA;  Surgeon: Stark Klein, MD;  Location: [place];  Service: General;  Laterality: Left;  . Hand surgery      tumor on finger, left hand  . Cesarean section    . Treatment fistula anal      Family History  Problem Relation Age of Onset  . Cancer Father 57    lung cancer   . Cancer Maternal Grandmother     breast  . Colon cancer Neg Hx    Social History:  reports that she has never smoked. She has never used smokeless tobacco. She reports that she does not drink alcohol or use illicit drugs.  Allergies:  Allergies  Allergen Reactions  . Codeine     Nausea, vomiting, pain     Medications Prior to Admission  Medication Sig Dispense Refill  . acetaminophen (TYLENOL) 500 MG tablet Take 500-1,000 mg by mouth every 6 (six) hours as needed. For pain      .  amLODipine (NORVASC) 2.5 MG tablet Take 2.5 mg by mouth daily.      . peg 3350 powder (MOVIPREP) 100 G SOLR Take 1 kit (200 g total) by mouth once.  1 kit  0  . fluticasone (FLONASE) 50 MCG/ACT nasal spray 2 sprays each nostril for 14 days then daily as needed.  1 g  0    No results found for this or any previous visit (from the past 48 hour(s)). No results found.  ROS  Blood pressure 151/86, pulse 68, temperature 97.6 F (36.4 C), temperature source Oral, resp. rate 18, height 5' 5"  (1.651 m), weight 223 lb (101.152 kg), last menstrual period 08/14/2014, SpO2 99.00%. Physical Exam  Constitutional: She appears well-developed and well-nourished.  HENT:  Mouth/Throat: Oropharynx is clear and moist.  Eyes: Conjunctivae are normal. No scleral icterus.   Neck: No thyromegaly present.  Cardiovascular: Normal rate, regular rhythm and normal heart sounds.   No murmur heard. Respiratory: Effort normal and breath sounds normal.  GI: Soft. She exhibits no distension and no mass. There is no tenderness.  Musculoskeletal: She exhibits edema (lymphedema left upper extremity).  Lymphadenopathy:    She has no cervical adenopathy.  Neurological: She is alert.  Skin: Skin is warm and dry.     Assessment/Plan Hematochezia. Diagnostic colonoscopy.  REHMAN,NAJEEB U 08/14/2014, 9:14 AM

## 2014-08-15 ENCOUNTER — Encounter (HOSPITAL_COMMUNITY): Payer: Self-pay | Admitting: Pharmacy Technician

## 2014-08-16 ENCOUNTER — Encounter (HOSPITAL_COMMUNITY): Payer: Self-pay | Admitting: Internal Medicine

## 2014-08-26 ENCOUNTER — Encounter (HOSPITAL_COMMUNITY): Payer: Self-pay | Admitting: Internal Medicine

## 2014-09-03 ENCOUNTER — Ambulatory Visit (HOSPITAL_COMMUNITY)
Admission: RE | Admit: 2014-09-03 | Discharge: 2014-09-03 | Disposition: A | Discharge status: Home / Self Care | Payer: 59 | Source: Ambulatory Visit | Attending: Oncology | Admitting: Oncology

## 2014-09-03 DIAGNOSIS — C50912 Malignant neoplasm of unspecified site of left female breast: Secondary | ICD-10-CM | POA: Diagnosis not present

## 2014-09-09 NOTE — Progress Notes (Signed)
-  Rescheduled-  Jessica Simpson  

## 2014-09-13 ENCOUNTER — Ambulatory Visit (HOSPITAL_COMMUNITY): Payer: 59

## 2014-09-13 ENCOUNTER — Ambulatory Visit (HOSPITAL_COMMUNITY): Payer: 59 | Admitting: Oncology

## 2014-09-14 NOTE — Progress Notes (Signed)
This encounter was created in error - please disregard.

## 2014-10-12 ENCOUNTER — Other Ambulatory Visit: Payer: Self-pay | Admitting: Nurse Practitioner

## 2014-10-21 NOTE — Progress Notes (Signed)
Default, Provider, MD No address on file  Breast cancer, IDC, Left, Stage III, Triple negative  BRCA1 negative  BRCA2 negative  Lymphedema  CURRENT THERAPY: Surveillance per NCCN guidelines  INTERVAL HISTORY: Jessica Simpson 43 y.o. female returns for  regular  visit for followup of Stage III triple negative breast cancer on the left side with a 7- 7.6 cm mass at presentation upper outer quadrant left breast, also a palpable mass in the left axilla that was quite large. She was treated with dose dense FEC followed by dose dense Taxotere. She had cycle 4 of FEC on 09/14/2011 and completed cycle 4 of dose dense Taxotere on 11/23/2011. She then went on to have surgery by Dr. Osborn Coho on 12/21/2011. She was still found to have a 1 cm high-grade cancer with 3 of 7 positive nodes. LVI was not seen. She had macrometastasis 1.0 cm in one lymph node, 4 mm in another node and she had isolated tumor cells in a third node. Extracapsular extension was seen. She received radiation therapy also.   I personally reviewed and went over laboratory results with the patient.  The results are noted within this dictation.  I personally reviewed and went over radiographic studies with the patient.  The results are noted within this dictation.  Mammogram from 09/03/2014 was BIRADS 2.  She will be due for her next screening mammogram in November 2016.   Chart reviewed.   She underwent a colonoscopy by Dr. Laural Golden on 08/14/2014 that demonstrated a normal colonic exam and she will need her next screening colonoscopy in November 2025  She is doing extremely well and denies any complaints.   Oncologically, she denies any complaints and ROS questioning is negative.   Past Medical History  Diagnosis Date  . BRCA1 negative 10/22/2011  . BRCA2 negative 10/22/2011  . Breast cancer, IDC, Left, Stage III, Triple negative 06/30/2011  . Lymphedema of arm   . Lymphedema 06/15/2012  . S/P radiation therapy     50 gray in 25 fractions to the left breast, supraclavicular, and axillary regions. She then received a boost to the left lumpectomy of 10 gray in 5 fractions. This was given at [place].  . Status post chemotherapy Comp. 11/23/11    FEC and Taxotere  . Fracture     right 4th toe  . Positive fecal occult blood test 07/22/2014  . History of breast cancer 07/22/2014    has Breast cancer, IDC, Left, Stage III, Triple negative; BRCA1 negative; BRCA2 negative; Lymphedema; Rectal bleeding; Positive fecal occult blood test; and History of breast cancer on her problem list.     is allergic to codeine.  Jessica Simpson does not currently have medications on file.  Past Surgical History  Procedure Laterality Date  . Removal breast mass  2004    benign  . Left breast biopsy with axillary biopsy      core bx  . Portacath placement  08/02/2011    dr Margot Chimes  . Tumor removal      on left hand  . Tumor removal      rt. eye  . 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 Breast surgery    . Incision and drainage abscess anal      x3  . Port-a-cath removal  12/21/2011    Procedure: REMOVAL PORT-A-CATH;  Surgeon: Haywood Lasso, MD;  Location: [place];  Service: General;  Laterality: N/A;  . Evacuation breast hematoma  12/21/2011    Procedure: EVACUATION HEMATOMA BREAST;  Surgeon: Stark Klein, MD;  Location: [place];  Service: General;  Laterality: Left;  . Evacuation breast hematoma  12/21/2011    Procedure: EVACUATION HEMATOMA BREAST;  Surgeon: Stark Klein, MD;  Location: [place];  Service: General;  Laterality: Left;  . Irrigation and debridement hematoma  12/21/2011    Procedure: IRRIGATION AND DEBRIDEMENT HEMATOMA;  Surgeon: Stark Klein, MD;  Location: [place];  Service: General;  Laterality: Left;  . Hand surgery      tumor on finger, left hand  . Cesarean section    . Treatment fistula anal    . Colonoscopy N/A 08/14/2014     Procedure: COLONOSCOPY;  Surgeon: Rogene Houston, MD;  [facility];  Service: Endoscopy;  Laterality: N/A;  930    Denies any headaches, dizziness, double vision, fevers, chills, night sweats, nausea, vomiting, diarrhea, constipation, chest pain, heart palpitations, shortness of breath, blood in stool, black tarry stool, urinary pain, urinary burning, urinary frequency, hematuria.   PHYSICAL EXAMINATION  ECOG PERFORMANCE STATUS: 0 - Asymptomatic  Filed Vitals:   10/28/14 1514  BP: 154/89  Pulse: 78  Temp: 98.5 F (36.9 C)  Resp: 16    GENERAL:alert, no distress, well nourished, well developed, comfortable, cooperative, obese and smiling SKIN: skin color, texture, turgor are normal, no rashes or significant lesions HEAD: Normocephalic, No masses, lesions, tenderness or abnormalities EYES: normal, PERRLA, EOMI, Conjunctiva are pink and non-injected EARS: External ears normal OROPHARYNX:lips, buccal mucosa, and tongue normal and mucous membranes are moist  NECK: supple, no adenopathy, thyroid normal size, non-tender, without nodularity, no stridor, non-tender, trachea midline LYMPH:  no palpable lymphadenopathy, no hepatosplenomegaly BREAST:breasts appear normal, no suspicious masses, no skin or nipple changes or axillary nodes LUNGS: clear to auscultation and percussion HEART: regular rate & rhythm, no murmurs, no gallops, S1 normal and S2 normal ABDOMEN:abdomen soft, non-tender, obese, normal bowel sounds, no masses or organomegaly and no hepatosplenomegaly BACK: Back symmetric, no curvature., No CVA tenderness EXTREMITIES:less then 2 second capillary refill, no joint deformities, effusion, or inflammation, no edema, no skin discoloration, no clubbing, no cyanosis  NEURO: alert & oriented x 3 with fluent speech, no focal motor/sensory deficits, gait normal   LABORATORY DATA: CBC    Component Value Date/Time   WBC 6.2 05/15/2014 1358   RBC 4.47 05/15/2014 1358     HGB 11.7* 05/15/2014 1358   HCT 35.7* 05/15/2014 1358   PLT 288 05/15/2014 1358   MCV 79.9 05/15/2014 1358   MCH 26.2 05/15/2014 1358   MCHC 32.8 05/15/2014 1358   RDW 13.5 05/15/2014 1358   LYMPHSABS 1.8 05/15/2014 1358   MONOABS 0.3 05/15/2014 1358   EOSABS 0.1 05/15/2014 1358   BASOSABS 0.0 05/15/2014 1358      Chemistry      Component Value Date/Time   NA 138 05/15/2014 1358   K 3.3* 05/15/2014 1358   CL 99 05/15/2014 1358   CO2 27 05/15/2014 1358   BUN 11 05/15/2014 1358   CREATININE 0.83 05/15/2014 1358      Component Value Date/Time   CALCIUM 9.0 05/15/2014 1358   ALKPHOS 104 05/15/2014 1358   AST 13 05/15/2014 1358   ALT 13 05/15/2014 1358   BILITOT <0.2* 05/15/2014 1358     Lab Results  Component Value Date   LABCA2 37 05/15/2014      RADIOGRAPHIC STUDIES:  09/03/2014  CLINICAL DATA: 43 year old female with history of left breast cancer post lumpectomy February 2013.  EXAM: DIGITAL DIAGNOSTIC BILATERAL MAMMOGRAM WITH CAD  COMPARISON: Previous exams.  ACR Breast Density Category c: The breast tissue is heterogeneously dense, which may obscure small masses.  FINDINGS: No suspicious masses or calcifications are seen in either breast. Postsurgical changes are again seen in the upper-outer left breast related to prior lumpectomy. A spot compression tangential view of the lumpectomy site was performed. There is no mammographic evidence of locally recurrent malignancy.  Mammographic images were processed with CAD.  IMPRESSION: No mammographic evidence of malignancy in either breast.  RECOMMENDATION: Diagnostic mammogram is suggested in 1 year. (Code:DM-B-01Y)  I have discussed the findings and recommendations with the patient. Results were also provided in writing at the conclusion of the visit. If applicable, a reminder letter will be sent to the patient regarding the next appointment.  BI-RADS CATEGORY 2:  Benign.   Electronically Signed  By: Everlean Alstrom M.D.  On: 09/03/2014 15:36     ASSESSMENT:  1. 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 She was treated with dose dense FEC followed by dose dense Taxotere. She had cycle 4 of FEC on 09/14/2011 and completed cycle 4 of dose dense Taxotere on 11/23/2011. She then went on to have surgery by Dr. Osborn Coho on 12/21/2011. She was still found to have a 1 cm high-grade cancer with 3 of 7 positive nodes. LVI was not seen. She had macrometastasis 1.0 cm in one lymph node, 4 mm in another node and she had isolated tumor cells in a third node. Extracapsular extension was seen. She received radiation therapy also.  2. Resumption of menstrual periods in October 2013. 3. BRCA1 and BRCA2 negative. 4. Next screening colonoscopy due in November 2025 by Dr. Laural Golden  Patient Active Problem List   Diagnosis Date Noted  . Positive fecal occult blood test 07/22/2014  . History of breast cancer 07/22/2014  . Rectal bleeding 07/10/2014  . Lymphedema 06/15/2012  . BRCA1 negative 10/22/2011  . BRCA2 negative 10/22/2011  . Breast cancer, IDC, Left, Stage III, Triple negative 06/30/2011    Class: Diagnosis of    PLAN:  1. I personally reviewed and went over laboratory results with the patient.  The results are noted within this dictation. 2. I personally reviewed and went over radiographic studies with the patient.  The results are noted within this dictation.   3. Chart reviewed 4. Next screening mammogram is due in Nov 2016 5. Next screening colonoscopy is due in Nov 2025 6. Labs today: CBC diff, CMET, Ferritin, CA 27.29 7. STAR assessment and referral if parameters are met.  8. Return in 6 months for follow-up   THERAPY PLAN:  NCCN guidelines recommends the following surveillance for invasive  breast cancer:  A. History and Physical exam every 4-6 months for 5 years and then every 12 months.  B. Mammography every 12 months  C. Women on Tamoxifen: annual gynecologic assessment every 12 months if uterus is present.  D. Women on aromatase inhibitor or who experience ovarian failure secondary to treatment should have monitoring of bone health with a bone mineral density determination at baseline and periodically thereafter.  E. Assess and encourage adherence to adjuvant endocrine therapy.  F. Evidence suggests that active lifestyle and achieving and maintaining an ideal  body weight (20-25 BMI) may lead to optimal breast cancer outcomes.   All questions were answered. The patient knows to call the clinic with any problems, questions or concerns. We can certainly see the patient much sooner if necessary.  Patient and plan discussed with Dr. Ancil Linsey and she is in agreement with the aforementioned.   Jessica Simpson 10/28/2014

## 2014-10-28 ENCOUNTER — Encounter (HOSPITAL_COMMUNITY): Payer: Self-pay | Admitting: Oncology

## 2014-10-28 ENCOUNTER — Encounter (HOSPITAL_COMMUNITY): Payer: BLUE CROSS/BLUE SHIELD | Attending: Oncology | Admitting: Oncology

## 2014-10-28 VITALS — BP 154/89 | HR 78 | Temp 98.5°F | Resp 16 | Wt 222.7 lb

## 2014-10-28 DIAGNOSIS — Z1371 Encounter for nonprocreative screening for genetic disease carrier status: Secondary | ICD-10-CM | POA: Insufficient documentation

## 2014-10-28 DIAGNOSIS — C773 Secondary and unspecified malignant neoplasm of axilla and upper limb lymph nodes: Secondary | ICD-10-CM

## 2014-10-28 DIAGNOSIS — Z171 Estrogen receptor negative status [ER-]: Secondary | ICD-10-CM

## 2014-10-28 DIAGNOSIS — C50912 Malignant neoplasm of unspecified site of left female breast: Secondary | ICD-10-CM | POA: Diagnosis not present

## 2014-10-28 DIAGNOSIS — I890 Lymphedema, not elsewhere classified: Secondary | ICD-10-CM

## 2014-10-28 DIAGNOSIS — C50412 Malignant neoplasm of upper-outer quadrant of left female breast: Secondary | ICD-10-CM

## 2014-10-28 LAB — COMPREHENSIVE METABOLIC PANEL
ALT: 14 U/L (ref 0–35)
ANION GAP: 6 (ref 5–15)
AST: 18 U/L (ref 0–37)
Albumin: 3.9 g/dL (ref 3.5–5.2)
Alkaline Phosphatase: 83 U/L (ref 39–117)
BUN: 11 mg/dL (ref 6–23)
CALCIUM: 8.9 mg/dL (ref 8.4–10.5)
CHLORIDE: 106 meq/L (ref 96–112)
CO2: 25 mmol/L (ref 19–32)
CREATININE: 0.8 mg/dL (ref 0.50–1.10)
GFR calc Af Amer: >90 mL/min (ref >90)
GFR, EST NON AFRICAN AMERICAN: 89 mL/min — AB (ref >90)
Glucose, Bld: 99 mg/dL (ref 70–99)
Potassium: 3.6 mmol/L (ref 3.5–5.1)
Sodium: 137 mmol/L (ref 135–145)
Total Bilirubin: 0.3 mg/dL (ref 0.3–1.2)
Total Protein: 7.1 g/dL (ref 6.0–8.3)

## 2014-10-28 LAB — CBC WITH DIFFERENTIAL/PLATELET
BASOS ABS: 0 10*3/uL (ref 0.0–0.1)
Basophils Relative: 0 % (ref 0–1)
EOS PCT: 2 % (ref 0–5)
Eosinophils Absolute: 0.1 10*3/uL (ref 0.0–0.7)
HEMATOCRIT: 36 % (ref 36.0–46.0)
Hemoglobin: 11.4 g/dL — ABNORMAL LOW (ref 12.0–15.0)
LYMPHS ABS: 1.9 10*3/uL (ref 0.7–4.0)
LYMPHS PCT: 34 % (ref 12–46)
MCH: 25.4 pg — ABNORMAL LOW (ref 26.0–34.0)
MCHC: 31.7 g/dL (ref 30.0–36.0)
MCV: 80.4 fL (ref 78.0–100.0)
Monocytes Absolute: 0.4 10*3/uL (ref 0.1–1.0)
Monocytes Relative: 8 % (ref 3–12)
NEUTROS ABS: 3.1 10*3/uL (ref 1.7–7.7)
Neutrophils Relative %: 56 % (ref 43–77)
Platelets: 279 10*3/uL (ref 150–400)
RBC: 4.48 MIL/uL (ref 3.87–5.11)
RDW: 13.9 % (ref 11.5–15.5)
WBC: 5.5 10*3/uL (ref 4.0–10.5)

## 2014-10-28 NOTE — Patient Instructions (Signed)
[facility] Discharge Instructions  RECOMMENDATIONS MADE BY THE CONSULTANT AND ANY TEST RESULTS WILL BE SENT TO YOUR REFERRING PHYSICIAN.  Congrats!  You have graduated to the next level of surveillance.  We will start seeing you every 6 months with labs.   Mammogram from November 2015 was negative for anything worrisome.  You will be due for your next screening mammogram in November 2016.  Return in 6 months for follow-up  Please call the [facility] with any questions or concerns regarding your breast cancer.    Thank you for choosing [facility] to provide your oncology and hematology care.  To afford each patient quality time with our providers, please arrive at least 15 minutes before your scheduled appointment time.  With your help, our goal is to use those 15 minutes to complete the necessary work-up to ensure our physicians have the information they need to help with your evaluation and healthcare recommendations.    Effective January 1st, 2014, we ask that you re-schedule your appointment with our physicians should you arrive 10 or more minutes late for your appointment.  We strive to give you quality time with our providers, and arriving late affects you and other patients whose appointments are after yours.    Again, thank you for choosing [facility].  Our hope is that these requests will decrease the amount of time that you wait before being seen by our physicians.       _____________________________________________________________  Should you have questions after your visit to [facility], please contact our office at [phone] between the hours of 8:30 a.m. and 5:00 p.m.  Voicemails left after 4:30 p.m. will not be returned until the following business day.  For prescription refill requests, have your pharmacy contact our office with your prescription refill request.

## 2014-10-28 NOTE — Progress Notes (Signed)
Sanjuana Mae presented for labwork. Labs per MD order drawn via Peripheral Line 23 gauge needle inserted in right AC  Good blood return present. Procedure without incident.  Needle removed intact. Patient tolerated procedure well.  STAR Program Physical Impairment and Functional Assessment Screening Tool  1. Are you having any pain, including headaches, joint pain, or muscle pain (upper body = OT; lower body = PT)?  Yes, but I hand this before my cancer diagnosis. (headaches only, no joint or muscle pain.)  2. Do your hands and/or feet feel numb or tingle (PT)?  Yes, this started after my diagnosis and is still a problem. (feet worse than hands)  3. Does any part of your body feel swollen or larger than usual (upper body = OT; lower body = PT)?  Yes, this started after my diagnosis and is still a problem.  4. Are you so tired that you cannot do the things you want or need to do (PT or OT)?  No  5. Are you feeling weak or are you having trouble moving any part of your body (PT/OT)?  No  6. Are you having trouble concentrating, thinking, or remembering things (OT/ST)?  Yes, this started after my diagnosis and is still a problem.  7. Are you having trouble moving around or feel like you might trip or fall (PT)?  No  8. Are you having trouble swallowing (ST)?  No  9. Are you having trouble speaking (ST)?  No  10. Are you having trouble with going or getting to the bathroom (OT)?  No  11. Are you having trouble with your sexual function (OT)?  No  12. Are you having trouble lifting things, even just your arms (OT/PT)?  No  13. Are you having trouble taking care of yourself as in dressing or bathing (OT)?  No  14. Are you having trouble with daily tasks like chores or shopping (OT)?  No  15. Are you having trouble driving (OT)?  No  16. Are you having trouble returning to work or completing your tasks at work (OT)?  No  Other concerns:     Legend: OT = Occupational Therapy PT = Physical Therapy ST = Speech Therapy

## 2014-10-29 LAB — CANCER ANTIGEN 27.29: CA 27.29: 42 U/mL — ABNORMAL HIGH (ref 0–39)

## 2014-11-07 ENCOUNTER — Encounter (HOSPITAL_BASED_OUTPATIENT_CLINIC_OR_DEPARTMENT_OTHER): Payer: Self-pay | Admitting: General Surgery

## 2014-12-03 ENCOUNTER — Encounter: Payer: Self-pay | Admitting: *Deleted

## 2014-12-06 ENCOUNTER — Telehealth: Payer: Self-pay | Admitting: *Deleted

## 2014-12-06 ENCOUNTER — Ambulatory Visit: Payer: BLUE CROSS/BLUE SHIELD | Attending: Radiation Oncology | Admitting: Radiation Oncology

## 2014-12-06 NOTE — Telephone Encounter (Signed)
Left vm re: patient was "no show" for FU today. Left name and 2 contact phone numbers and requested she call back to reschedule.

## 2014-12-11 ENCOUNTER — Encounter: Payer: Self-pay | Admitting: *Deleted

## 2014-12-11 NOTE — Telephone Encounter (Signed)
I sent a no show letter to Ms. Quentin Cornwall.  Santiago Glad

## 2014-12-13 ENCOUNTER — Ambulatory Visit: Payer: Self-pay | Admitting: Radiation Oncology

## 2015-04-30 ENCOUNTER — Ambulatory Visit (HOSPITAL_COMMUNITY): Payer: Self-pay | Admitting: Hematology & Oncology

## 2015-04-30 ENCOUNTER — Encounter (HOSPITAL_BASED_OUTPATIENT_CLINIC_OR_DEPARTMENT_OTHER): Payer: BLUE CROSS/BLUE SHIELD | Admitting: Hematology & Oncology

## 2015-04-30 ENCOUNTER — Encounter (HOSPITAL_COMMUNITY): Payer: BLUE CROSS/BLUE SHIELD | Attending: Hematology & Oncology

## 2015-04-30 ENCOUNTER — Encounter (HOSPITAL_COMMUNITY): Payer: Self-pay | Admitting: Hematology & Oncology

## 2015-04-30 VITALS — BP 141/95 | HR 69 | Temp 98.2°F | Resp 20 | Wt 209.4 lb

## 2015-04-30 DIAGNOSIS — C50912 Malignant neoplasm of unspecified site of left female breast: Secondary | ICD-10-CM

## 2015-04-30 DIAGNOSIS — Z853 Personal history of malignant neoplasm of breast: Secondary | ICD-10-CM

## 2015-04-30 DIAGNOSIS — C773 Secondary and unspecified malignant neoplasm of axilla and upper limb lymph nodes: Secondary | ICD-10-CM

## 2015-04-30 DIAGNOSIS — I890 Lymphedema, not elsewhere classified: Secondary | ICD-10-CM | POA: Diagnosis present

## 2015-04-30 DIAGNOSIS — Z1371 Encounter for nonprocreative screening for genetic disease carrier status: Secondary | ICD-10-CM

## 2015-04-30 DIAGNOSIS — C50412 Malignant neoplasm of upper-outer quadrant of left female breast: Secondary | ICD-10-CM | POA: Diagnosis not present

## 2015-04-30 LAB — COMPREHENSIVE METABOLIC PANEL
ALT: 14 U/L (ref 14–54)
ANION GAP: 10 (ref 5–15)
AST: 16 U/L (ref 15–41)
Albumin: 4 g/dL (ref 3.5–5.0)
Alkaline Phosphatase: 88 U/L (ref 38–126)
BUN: 12 mg/dL (ref 6–20)
CO2: 23 mmol/L (ref 22–32)
CREATININE: 0.84 mg/dL (ref 0.44–1.00)
Calcium: 8.9 mg/dL (ref 8.9–10.3)
Chloride: 103 mmol/L (ref 101–111)
GFR calc Af Amer: >60 mL/min (ref >60)
GFR calc non Af Amer: >60 mL/min (ref >60)
Glucose, Bld: 82 mg/dL (ref 65–99)
Potassium: 3.5 mmol/L (ref 3.5–5.1)
SODIUM: 136 mmol/L (ref 135–145)
TOTAL PROTEIN: 7.4 g/dL (ref 6.5–8.1)
Total Bilirubin: 0.5 mg/dL (ref 0.3–1.2)

## 2015-04-30 LAB — CBC WITH DIFFERENTIAL/PLATELET
BASOS ABS: 0 10*3/uL (ref 0.0–0.1)
BASOS PCT: 0 % (ref 0–1)
Eosinophils Absolute: 0.1 10*3/uL (ref 0.0–0.7)
Eosinophils Relative: 2 % (ref 0–5)
HCT: 37.7 % (ref 36.0–46.0)
Hemoglobin: 12.3 g/dL (ref 12.0–15.0)
Lymphocytes Relative: 35 % (ref 12–46)
Lymphs Abs: 2 10*3/uL (ref 0.7–4.0)
MCH: 26.5 pg (ref 26.0–34.0)
MCHC: 32.6 g/dL (ref 30.0–36.0)
MCV: 81.3 fL (ref 78.0–100.0)
Monocytes Absolute: 0.5 10*3/uL (ref 0.1–1.0)
Monocytes Relative: 9 % (ref 3–12)
NEUTROS PCT: 54 % (ref 43–77)
Neutro Abs: 3.2 10*3/uL (ref 1.7–7.7)
Platelets: 290 10*3/uL (ref 150–400)
RBC: 4.64 MIL/uL (ref 3.87–5.11)
RDW: 13.9 % (ref 11.5–15.5)
WBC: 5.8 10*3/uL (ref 4.0–10.5)

## 2015-04-30 NOTE — Patient Instructions (Signed)
..  [place] at [facility] Discharge Instructions  RECOMMENDATIONS MADE BY THE CONSULTANT AND ANY TEST RESULTS WILL BE SENT TO YOUR REFERRING PHYSICIAN.  Seen and discussion with dr. Whitney Muse.   Blood work draw today.  Will call with results.   Call the cancer center with any questions and/or concerns. Follow up appt in 6 months.     Thank you for choosing [place] at [facility] to provide your oncology and hematology care.  To afford each patient quality time with our provider, please arrive at least 15 minutes before your scheduled appointment time.    You need to re-schedule your appointment should you arrive 10 or more minutes late.  We strive to give you quality time with our providers, and arriving late affects you and other patients whose appointments are after yours.  Also, if you no show three or more times for appointments you may be dismissed from the clinic at the providers discretion.     Again, thank you for choosing [facility].  Our hope is that these requests will decrease the amount of time that you wait before being seen by our physicians.       _____________________________________________________________  Should you have questions after your visit to [facility], please contact our office at [phone] between the hours of 8:30 a.m. and 4:30 p.m.  Voicemails left after 4:30 p.m. will not be returned until the following business day.  For prescription refill requests, have your pharmacy contact our office.

## 2015-04-30 NOTE — Progress Notes (Signed)
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 female returns for  regular  visit for followup of Stage III triple negative breast cancer on the left side with a 7- 7.6 cm mass at presentation upper outer quadrant left breast, also a palpable mass in the left axilla that was quite large. She was treated with dose dense FEC followed by dose dense Taxotere. She had cycle 4 of FEC on 09/14/2011 and completed cycle 4 of dose dense Taxotere on 11/23/2011. She then went on to have surgery by Dr. Osborn Coho on 12/21/2011. She was still found to have a 1 cm high-grade cancer with 3 of 7 positive nodes. LVI was not seen. She had macrometastasis 1.0 cm in one lymph node, 4 mm in another node and she had isolated tumor cells in a third node. Extracapsular extension was seen. She received radiation therapy also.   She underwent a colonoscopy by Dr. Laural Golden on 08/14/2014 that demonstrated a normal colonic exam and she will need her next screening colonoscopy in November 2025  The patient is present alone today and states she is doing well.  She had her mammogram and colonoscopy done last year. She will be due for another mammogram in November.  She denies any problems with her breasts. The patient says that her energy is well and she sleeps good most of the time. Her periods are regular and she has had intentional weight loss due to exercise from walking started about 3 months ago. The patient denies pain in her arm of her surgical side but admits to having intermittent swelling.  She says that she is a non-smoker and her bowels are normal. She has no concerns at this time.   Past Medical History  Diagnosis Date  . BRCA1 negative 10/22/2011  . BRCA2 negative 10/22/2011  . Breast cancer, IDC, Left, Stage III, Triple negative 06/30/2011  . Lymphedema of arm   .  Lymphedema 06/15/2012  . S/P radiation therapy 2013    50 gray in 25 fractions to the left breast, supraclavicular, and axillary regions. She then received a boost to the left lumpectomy of 10 gray in 5 fractions. This was given at [place].  . Status post chemotherapy Comp. 11/23/11    FEC and Taxotere  . Fracture     right 4th toe  . Positive fecal occult blood test 07/22/2014  . History of breast cancer 07/22/2014    has Breast cancer, IDC, Left, Stage III, Triple negative; BRCA1 negative; BRCA2 negative; Lymphedema; Rectal bleeding; Positive fecal occult blood test; and History of breast cancer on her problem list.     is allergic to codeine.  [name] does not currently have medications on file.  Past Surgical History  Procedure Laterality Date  . Removal breast mass  2004    benign  . Left breast biopsy with axillary biopsy      core bx  . Portacath placement  08/02/2011    dr Margot Chimes  . Tumor removal      on left hand  . Tumor removal      rt. eye  . 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 Breast surgery    . Incision and drainage abscess anal      x3  . Port-a-cath removal  12/21/2011  Procedure: REMOVAL PORT-A-CATH;  Surgeon: Haywood Lasso, MD;  Location: [place];  Service: General;  Laterality: N/A;  . Evacuation breast hematoma  12/21/2011    Procedure: EVACUATION HEMATOMA BREAST;  Surgeon: Stark Klein, MD;  Location: [place];  Service: General;  Laterality: Left;  . Hand surgery      tumor on finger, left hand  . Cesarean section    . Treatment fistula anal    . Colonoscopy N/A 08/14/2014    Procedure: COLONOSCOPY;  Surgeon: Rogene Houston, MD;  [facility];  Service: Endoscopy;  Laterality: N/A;  930    Denies any headaches, dizziness, double vision, fevers, chills, night sweats, nausea, vomiting, diarrhea, constipation, chest pain, heart palpitations, shortness of  breath, blood in stool, black tarry stool, urinary pain, urinary burning, urinary frequency, hematuria. 14 point review of systems was performed and is negative except as detailed under history of present illness and above  PHYSICAL EXAMINATION  ECOG PERFORMANCE STATUS: 0 - Asymptomatic  Filed Vitals:   04/30/15 1300  BP: 141/95  Pulse: 69  Temp: 98.2 F (36.8 C)  Resp: 20    GENERAL:alert, no distress, well nourished, well developed, comfortable, cooperative, obese and smiling SKIN: skin color, texture, turgor are normal, no rashes or significant lesions HEAD: Normocephalic, No masses, lesions, tenderness or abnormalities EYES: normal, PERRLA, EOMI, Conjunctiva are pink and non-injected EARS: External ears normal OROPHARYNX:lips, buccal mucosa, and tongue normal and mucous membranes are moist  NECK: supple, no adenopathy, thyroid normal size, non-tender, without nodularity, no stridor, non-tender, trachea midline LYMPH:  no palpable lymphadenopathy, no hepatosplenomegaly BREAST:breasts appear normal, no suspicious masses, no skin or nipple changes or axillary nodes  L breast large incision site, 1-3 o'clock position, 4 cm from nipple LUNGS: clear to auscultation and percussion HEART: regular rate & rhythm, no murmurs, no gallops, S1 normal and S2 normal ABDOMEN:abdomen soft, non-tender, obese, normal bowel sounds, no masses or organomegaly and no hepatosplenomegaly BACK: Back symmetric, no curvature., No CVA tenderness EXTREMITIES:less then 2 second capillary refill, no joint deformities, effusion, or inflammation, no edema, no skin discoloration, no clubbing, no cyanosis No significant lymphedema noted NEURO: alert & oriented x 3 with fluent speech, no focal motor/sensory deficits, gait normal   LABORATORY DATA: CBC    Component Value Date/Time   WBC 5.8 04/30/2015 1300   RBC 4.64 04/30/2015 1300   HGB 12.3 04/30/2015 1300   HCT 37.7 04/30/2015 1300   PLT 290 04/30/2015  1300   MCV 81.3 04/30/2015 1300   MCH 26.5 04/30/2015 1300   MCHC 32.6 04/30/2015 1300   RDW 13.9 04/30/2015 1300   LYMPHSABS 2.0 04/30/2015 1300   MONOABS 0.5 04/30/2015 1300   EOSABS 0.1 04/30/2015 1300   BASOSABS 0.0 04/30/2015 1300      Chemistry      Component Value Date/Time   NA 136 04/30/2015 1300   K 3.5 04/30/2015 1300   CL 103 04/30/2015 1300   CO2 23 04/30/2015 1300   BUN 12 04/30/2015 1300   CREATININE 0.84 04/30/2015 1300      Component Value Date/Time   CALCIUM 8.9 04/30/2015 1300   ALKPHOS 88 04/30/2015 1300   AST 16 04/30/2015 1300   ALT 14 04/30/2015 1300   BILITOT 0.5 04/30/2015 1300     Lab Results  Component Value Date   LABCA2 31.6 04/30/2015      RADIOGRAPHIC STUDIES:  09/03/2014   CLINICAL DATA: 44 year old female with history of left breast  cancer post lumpectomy February 2013.  EXAM: DIGITAL DIAGNOSTIC BILATERAL MAMMOGRAM WITH CAD  COMPARISON: Previous exams.  ACR Breast Density Category c: The breast tissue is heterogeneously dense, which may obscure small masses.  FINDINGS: No suspicious masses or calcifications are seen in either breast. Postsurgical changes are again seen in the upper-outer left breast related to prior lumpectomy. A spot compression tangential view of the lumpectomy site was performed. There is no mammographic evidence of locally recurrent malignancy.  Mammographic images were processed with CAD.  IMPRESSION: No mammographic evidence of malignancy in either breast.  RECOMMENDATION: Diagnostic mammogram is suggested in 1 year. (Code:DM-B-01Y)  I have discussed the findings and recommendations with the patient. Results were also provided in writing at the conclusion of the visit. If applicable, a reminder letter will be sent to the patient regarding the next appointment.  BI-RADS CATEGORY 2: Benign.   Electronically Signed  By: Everlean Alstrom M.D.  On: 09/03/2014 15:36       ASSESSMENT:  1. 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 She was treated with dose dense FEC followed by dose dense Taxotere. She had cycle 4 of FEC on 09/14/2011 and completed cycle 4 of dose dense Taxotere on 11/23/2011. She then went on to have surgery by Dr. Osborn Coho on 12/21/2011. She was still found to have a 1 cm high-grade cancer with 3 of 7 positive nodes. LVI was not seen. She had macrometastasis 1.0 cm in one lymph node, 4 mm in another node and she had isolated tumor cells in a third node. Extracapsular extension was seen. She received radiation therapy also.  2. Resumption of menstrual periods in October 2013. 3. BRCA1 and BRCA2 negative. 4. Next screening colonoscopy due in November 2025 by Dr. Laural Golden  Patient Active Problem List   Diagnosis Date Noted  . Positive fecal occult blood test 07/22/2014  . History of breast cancer 07/22/2014  . Rectal bleeding 07/10/2014  . Lymphedema 06/15/2012  . BRCA1 negative 10/22/2011  . BRCA2 negative 10/22/2011  . Breast cancer, IDC, Left, Stage III, Triple negative 06/30/2011    Class: Diagnosis of    PLAN:  She is to continue with her walking.  I discussed the benefits of exercise in reducing breast cancer recurrence. She is unsure if she ever had genetics counseling. I will look into this for her and if not done will refer her.  We will schedule her mammogram for November. She is up to date in regards to other well care.  She will return in 6 months for ongoing observation.    I reviewed her laboratory studies with her.   THERAPY PLAN:  NCCN guidelines recommends the following surveillance for invasive breast cancer:  A. History and Physical exam every 4-6 months for 5 years and then every 12 months.  B. Mammography every 12 months  C. Women on Tamoxifen: annual gynecologic  assessment every 12 months if uterus is present.  D. Women on aromatase inhibitor or who experience ovarian failure secondary to treatment should have monitoring of bone health with a bone mineral density determination at baseline and periodically thereafter.  E. Assess and encourage adherence to adjuvant endocrine therapy.  F. Evidence suggests that active lifestyle and achieving and maintaining an ideal body weight (20-25 BMI) may lead to optimal breast cancer outcomes.   All questions were  answered. The patient knows to call the clinic with any problems, questions or concerns. We can certainly see the patient much sooner if necessary.   This document serves as a record of services personally performed by Ancil Linsey, MD. It was created on her behalf by Janace Hoard, a trained medical scribe. The creation of this record is based on the scribe's personal observations and the provider's statements to them. This document has been checked and approved by the attending provider.  I have reviewed the above documentation for accuracy and completeness, and I agree with the above.  This note was electronically signed.  Kelby Fam. Whitney Muse, MD

## 2015-05-01 LAB — CANCER ANTIGEN 27.29: CA 27.29: 31.6 U/mL (ref 0.0–38.6)

## 2015-05-01 NOTE — Progress Notes (Signed)
Labs drawn

## 2015-05-11 ENCOUNTER — Encounter (HOSPITAL_COMMUNITY): Payer: Self-pay | Admitting: Hematology & Oncology

## 2015-07-24 ENCOUNTER — Ambulatory Visit (INDEPENDENT_AMBULATORY_CARE_PROVIDER_SITE_OTHER): Payer: BLUE CROSS/BLUE SHIELD | Admitting: Adult Health

## 2015-07-24 ENCOUNTER — Encounter: Payer: Self-pay | Admitting: Adult Health

## 2015-07-24 ENCOUNTER — Other Ambulatory Visit: Payer: Self-pay | Admitting: Adult Health

## 2015-07-24 VITALS — BP 140/86 | HR 72 | Ht 66.0 in | Wt 210.0 lb

## 2015-07-24 DIAGNOSIS — Z01419 Encounter for gynecological examination (general) (routine) without abnormal findings: Secondary | ICD-10-CM | POA: Diagnosis not present

## 2015-07-24 DIAGNOSIS — Z853 Personal history of malignant neoplasm of breast: Secondary | ICD-10-CM

## 2015-07-24 DIAGNOSIS — Z1211 Encounter for screening for malignant neoplasm of colon: Secondary | ICD-10-CM | POA: Diagnosis not present

## 2015-07-24 LAB — HEMOCCULT GUIAC POC 1CARD (OFFICE): Fecal Occult Blood, POC: NEGATIVE

## 2015-07-24 NOTE — Progress Notes (Signed)
Patient ID: Jessica Simpson, female   DOB: July 07, 1971, 44 y.o.   MRN: [id number] History of Present Illness: Jessica Simpson is a 44 year old black female in for a well woman gyn exam, she had a normal pap with negative HPV 07/22/14. PCP is Tonette Bihari, in [place].  Current Medications, Allergies, Past Medical History, Past Surgical History, Family History and Social History were reviewed in [employer] record.     Review of Systems: Patient denies any headaches, hearing loss, fatigue, blurred vision, shortness of breath, chest pain, abdominal pain, problems with bowel movements, urination, or intercourse(not having). No joint pain or mood swings.    Physical Exam:BP 140/86 mmHg  Pulse 72  Ht 5\' 6"  (1.676 m)  Wt 210 lb (95.255 kg)  BMI 33.91 kg/m2  LMP 07/05/2015 General:  Well developed, well nourished, no acute distress Skin:  Warm and dry Neck:  Midline trachea, normal thyroid, good ROM, no lymphadenopathy Lungs; Clear to auscultation bilaterally Breast:  No dominant palpable mass, retraction, or nipple discharge Cardiovascular: Regular rate and rhythm Abdomen:  Soft, non tender, no hepatosplenomegaly Pelvic:  External genitalia is normal in appearance, no lesions.  The vagina is normal in appearance. Urethra has no lesions or masses. The cervix is smooth.  Uterus is felt to be normal size, shape, and contour.  No adnexal masses or tenderness noted.Bladder is non tender, no masses felt. Rectal: Good sphincter tone, no polyps, or hemorrhoids felt.  Hemoccult negative. Extremities/musculoskeletal:  No swelling or varicosities noted, no clubbing or cyanosis Psych:  No mood changes, alert and cooperative,seems happy   Impression:  Well woman gyn exam no pap History of breast cancer   Plan: Physical in 1 year Mammogram yearly Colonoscopy per Dr Laural Golden To get flu shot next week Check CBC,CMP,TSH and lipids Check vitamin D level

## 2015-07-24 NOTE — Patient Instructions (Signed)
Physical in 1 year Mammogram yearly Colonoscopy per GI 

## 2015-07-25 LAB — CBC
HEMOGLOBIN: 12.7 g/dL (ref 11.1–15.9)
Hematocrit: 38.4 % (ref 34.0–46.6)
MCH: 26.2 pg — ABNORMAL LOW (ref 26.6–33.0)
MCHC: 33.1 g/dL (ref 31.5–35.7)
MCV: 79 fL (ref 79–97)
Platelets: 265 10*3/uL (ref 150–379)
RBC: 4.84 x10E6/uL (ref 3.77–5.28)
RDW: 14.2 % (ref 12.3–15.4)
WBC: 5 10*3/uL (ref 3.4–10.8)

## 2015-07-25 LAB — COMPREHENSIVE METABOLIC PANEL
ALBUMIN: 4 g/dL (ref 3.5–5.5)
ALT: 9 IU/L (ref 0–32)
AST: 13 IU/L (ref 0–40)
Albumin/Globulin Ratio: 1.6 (ref 1.1–2.5)
Alkaline Phosphatase: 94 IU/L (ref 39–117)
BILIRUBIN TOTAL: 0.3 mg/dL (ref 0.0–1.2)
BUN/Creatinine Ratio: 11 (ref 9–23)
BUN: 10 mg/dL (ref 6–24)
CHLORIDE: 102 mmol/L (ref 97–108)
CO2: 21 mmol/L (ref 18–29)
Calcium: 9.2 mg/dL (ref 8.7–10.2)
Creatinine, Ser: 0.92 mg/dL (ref 0.57–1.00)
GFR calc Af Amer: 88 mL/min/{1.73_m2} (ref >59)
GFR calc non Af Amer: 76 mL/min/{1.73_m2} (ref >59)
GLUCOSE: 88 mg/dL (ref 65–99)
Globulin, Total: 2.5 g/dL (ref 1.5–4.5)
POTASSIUM: 4.4 mmol/L (ref 3.5–5.2)
SODIUM: 140 mmol/L (ref 134–144)
Total Protein: 6.5 g/dL (ref 6.0–8.5)

## 2015-07-25 LAB — TSH: TSH: 1.15 u[IU]/mL (ref 0.450–4.500)

## 2015-07-25 LAB — LIPID PANEL
Chol/HDL Ratio: 4.3 ratio units (ref 0.0–4.4)
Cholesterol, Total: 204 mg/dL — ABNORMAL HIGH (ref 100–199)
HDL: 47 mg/dL (ref >39)
LDL Calculated: 133 mg/dL — ABNORMAL HIGH (ref 0–99)
Triglycerides: 121 mg/dL (ref 0–149)
VLDL Cholesterol Cal: 24 mg/dL (ref 5–40)

## 2015-07-25 LAB — VITAMIN D 25 HYDROXY (VIT D DEFICIENCY, FRACTURES): VIT D 25 HYDROXY: 14.9 ng/mL — AB (ref 30.0–100.0)

## 2015-07-28 ENCOUNTER — Telehealth: Payer: Self-pay | Admitting: Adult Health

## 2015-07-28 NOTE — Telephone Encounter (Signed)
Left message to call about labs 

## 2015-07-29 ENCOUNTER — Telehealth: Payer: Self-pay | Admitting: Adult Health

## 2015-07-29 NOTE — Telephone Encounter (Signed)
Pt aware of labs, take vitamin D3 5000IU daily and increase activity and decrease carbs

## 2015-09-01 ENCOUNTER — Other Ambulatory Visit (HOSPITAL_COMMUNITY): Payer: Self-pay | Admitting: Hematology & Oncology

## 2015-09-01 DIAGNOSIS — C50912 Malignant neoplasm of unspecified site of left female breast: Secondary | ICD-10-CM

## 2015-09-08 ENCOUNTER — Other Ambulatory Visit (HOSPITAL_COMMUNITY): Payer: Self-pay | Admitting: Hematology & Oncology

## 2015-09-08 ENCOUNTER — Ambulatory Visit
Admission: RE | Admit: 2015-09-08 | Discharge: 2015-09-08 | Disposition: A | Discharge status: Home / Self Care | Payer: BLUE CROSS/BLUE SHIELD | Source: Ambulatory Visit | Attending: Hematology & Oncology | Admitting: Hematology & Oncology

## 2015-09-08 ENCOUNTER — Other Ambulatory Visit (HOSPITAL_COMMUNITY): Payer: Self-pay | Admitting: Oncology

## 2015-09-08 DIAGNOSIS — C50912 Malignant neoplasm of unspecified site of left female breast: Secondary | ICD-10-CM

## 2015-09-09 ENCOUNTER — Other Ambulatory Visit (HOSPITAL_COMMUNITY): Payer: Self-pay | Admitting: Hematology & Oncology

## 2015-09-09 DIAGNOSIS — C50912 Malignant neoplasm of unspecified site of left female breast: Secondary | ICD-10-CM

## 2015-09-10 ENCOUNTER — Ambulatory Visit
Admission: RE | Admit: 2015-09-10 | Discharge: 2015-09-10 | Disposition: A | Discharge status: Home / Self Care | Payer: BLUE CROSS/BLUE SHIELD | Source: Ambulatory Visit | Attending: Hematology & Oncology | Admitting: Hematology & Oncology

## 2015-09-10 ENCOUNTER — Other Ambulatory Visit (HOSPITAL_COMMUNITY): Payer: Self-pay | Admitting: Hematology & Oncology

## 2015-09-10 ENCOUNTER — Ambulatory Visit
Admission: RE | Admit: 2015-09-10 | Discharge: 2015-09-10 | Disposition: A | Discharge status: Home / Self Care | Payer: BLUE CROSS/BLUE SHIELD | Source: Ambulatory Visit | Attending: Oncology | Admitting: Oncology

## 2015-09-10 DIAGNOSIS — C50912 Malignant neoplasm of unspecified site of left female breast: Secondary | ICD-10-CM

## 2015-10-08 ENCOUNTER — Telehealth: Payer: Self-pay | Admitting: Adult Health

## 2015-10-09 NOTE — Telephone Encounter (Signed)
Spoke with pt letting her know she will need to schedule an appt to discuss. Pt voiced understanding. Call transferred to front desk for appt. [place]

## 2015-10-09 NOTE — Telephone Encounter (Signed)
Left message x 1. JSY 

## 2015-10-23 ENCOUNTER — Encounter (HOSPITAL_COMMUNITY): Payer: BLUE CROSS/BLUE SHIELD | Attending: Hematology & Oncology | Admitting: Hematology & Oncology

## 2015-10-23 ENCOUNTER — Encounter (HOSPITAL_COMMUNITY): Payer: BLUE CROSS/BLUE SHIELD

## 2015-10-23 VITALS — BP 152/81 | HR 81 | Temp 98.3°F | Resp 20

## 2015-10-23 DIAGNOSIS — C50912 Malignant neoplasm of unspecified site of left female breast: Secondary | ICD-10-CM

## 2015-10-23 DIAGNOSIS — C50412 Malignant neoplasm of upper-outer quadrant of left female breast: Secondary | ICD-10-CM

## 2015-10-23 DIAGNOSIS — N6001 Solitary cyst of right breast: Secondary | ICD-10-CM

## 2015-10-23 LAB — COMPREHENSIVE METABOLIC PANEL
ALT: 14 U/L (ref 14–54)
ANION GAP: 4 — AB (ref 5–15)
AST: 16 U/L (ref 15–41)
Albumin: 3.9 g/dL (ref 3.5–5.0)
Alkaline Phosphatase: 84 U/L (ref 38–126)
BUN: 15 mg/dL (ref 6–20)
CHLORIDE: 105 mmol/L (ref 101–111)
CO2: 27 mmol/L (ref 22–32)
CREATININE: 1.03 mg/dL — AB (ref 0.44–1.00)
Calcium: 8.7 mg/dL — ABNORMAL LOW (ref 8.9–10.3)
Glucose, Bld: 111 mg/dL — ABNORMAL HIGH (ref 65–99)
POTASSIUM: 3.5 mmol/L (ref 3.5–5.1)
SODIUM: 136 mmol/L (ref 135–145)
Total Bilirubin: 0.2 mg/dL — ABNORMAL LOW (ref 0.3–1.2)
Total Protein: 7.4 g/dL (ref 6.5–8.1)

## 2015-10-23 LAB — CBC WITH DIFFERENTIAL/PLATELET
BASOS ABS: 0 10*3/uL (ref 0.0–0.1)
Basophils Relative: 1 %
EOS ABS: 0.1 10*3/uL (ref 0.0–0.7)
Eosinophils Relative: 2 %
HCT: 36.5 % (ref 36.0–46.0)
HEMOGLOBIN: 12.4 g/dL (ref 12.0–15.0)
LYMPHS ABS: 2.2 10*3/uL (ref 0.7–4.0)
Lymphocytes Relative: 38 %
MCH: 27.6 pg (ref 26.0–34.0)
MCHC: 34 g/dL (ref 30.0–36.0)
MCV: 81.1 fL (ref 78.0–100.0)
Monocytes Absolute: 0.3 10*3/uL (ref 0.1–1.0)
Monocytes Relative: 5 %
NEUTROS PCT: 54 %
Neutro Abs: 3.3 10*3/uL (ref 1.7–7.7)
PLATELETS: 288 10*3/uL (ref 150–400)
RBC: 4.5 MIL/uL (ref 3.87–5.11)
RDW: 13.7 % (ref 11.5–15.5)
WBC: 6 10*3/uL (ref 4.0–10.5)

## 2015-10-23 NOTE — Progress Notes (Signed)
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 female returns for  regular  visit for followup of Stage III triple negative breast cancer on the left side with a 7- 7.6 cm mass at presentation upper outer quadrant left breast, also a palpable mass in the left axilla that was quite large. She was treated with dose dense FEC followed by dose dense Taxotere. She had cycle 4 of FEC on 09/14/2011 and completed cycle 4 of dose dense Taxotere on 11/23/2011. She then went on to have surgery by Dr. Osborn Coho on 12/21/2011. She was still found to have a 1 cm high-grade cancer with 3 of 7 positive nodes. LVI was not seen. She had macrometastasis 1.0 cm in one lymph node, 4 mm in another node and she had isolated tumor cells in a third node. Extracapsular extension was seen. She received radiation therapy also.   She underwent a colonoscopy by Dr. Laural Golden on 08/14/2014 that demonstrated a normal colonic exam and she will need her next screening colonoscopy in November 2025  Jessica Simpson 44 y.o. female returns to the [facility] alone today. She will be having a breast exam today as a part of her follow-up appointment.  She says that her Christmas was good, and that in general she's been feeling good. She confirms that her mood's been good, but she's glad that Christmas is over. Patient is very friendly and engaging in jokes with staff.  Ms. Araque denies any dental problems or other issues with her teeth, and denies any lumps or bumps. She also denies any swelling in her legs.  She had a R breast cyst aspiration on 09/08/2015. It was the first time she's had a breast cyst. She confirms that she is a caffeine drinker. She is due for a diagnostic mammogram in November 2017.  She states that her bowels are okay and denies any blood in her stools. She had a  colonoscopy last year because she discovered some blood in her stool at that time.  She has had her flu shot this year.    Past Medical History  Diagnosis Date  . BRCA1 negative 10/22/2011  . BRCA2 negative 10/22/2011  . Breast cancer, IDC, Left, Stage III, Triple negative 06/30/2011  . Lymphedema of arm   . Lymphedema 06/15/2012  . S/P radiation therapy 2013    50 gray in 25 fractions to the left breast, supraclavicular, and axillary regions. She then received a boost to the left lumpectomy of 10 gray in 5 fractions. This was given at [place].  . Status post chemotherapy Comp. 11/23/11    FEC and Taxotere  . Fracture     right 4th toe  . Positive fecal occult blood test 07/22/2014  . History of breast cancer 07/22/2014  . Hypertension     has Breast cancer, IDC, Left, Stage III, Triple negative; BRCA1 negative; BRCA2 negative; Lymphedema; Rectal bleeding; Positive fecal occult blood test; and History of breast cancer on her problem list.     is allergic to codeine.  Ms. Harder does not currently have medications on file.  Past Surgical History  Procedure Laterality Date  . Removal breast mass  2004    benign  . Left breast biopsy with axillary biopsy      core bx  . Portacath placement  08/02/2011  dr Margot Chimes  . Tumor removal      on left hand  . Tumor removal      rt. eye  . 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 Breast surgery    . Incision and drainage abscess anal      x3  . Port-a-cath removal  12/21/2011    Procedure: REMOVAL PORT-A-CATH;  Surgeon: Haywood Lasso, MD;  Location: [place];  Service: General;  Laterality: N/A;  . Evacuation breast hematoma  12/21/2011    Procedure: EVACUATION HEMATOMA BREAST;  Surgeon: Stark Klein, MD;  Location: [place];  Service: General;  Laterality: Left;  . Hand surgery      tumor on finger, left hand  . Cesarean section    . Treatment fistula  anal    . Colonoscopy N/A 08/14/2014    Procedure: COLONOSCOPY;  Surgeon: Rogene Houston, MD;  [facility];  Service: Endoscopy;  Laterality: N/A;  930    Denies any headaches, dizziness, double vision, fevers, chills, night sweats, nausea, vomiting, diarrhea, constipation, chest pain, heart palpitations, shortness of breath, blood in stool, black tarry stool, urinary pain, urinary burning, urinary frequency, hematuria.  14 point review of systems was performed and is negative except as detailed under history of present illness and above    PHYSICAL EXAMINATION  ECOG PERFORMANCE STATUS: 0 - Asymptomatic  Filed Vitals:   10/23/15 1409  BP: 152/81  Pulse: 81  Temp: 98.3 F (36.8 C)  Resp: 20    GENERAL:alert, no distress, well nourished, well developed, comfortable, cooperative, obese and smiling SKIN: skin color, texture, turgor are normal, no rashes or significant lesions HEAD: Normocephalic, No masses, lesions, tenderness or abnormalities EYES: normal, PERRLA, EOMI, Conjunctiva are pink and non-injected EARS: External ears normal OROPHARYNX:lips, buccal mucosa, and tongue normal and mucous membranes are moist  NECK: supple, no adenopathy, thyroid normal size, non-tender, without nodularity, no stridor, non-tender, trachea midline LYMPH:  no palpable lymphadenopathy, no hepatosplenomegaly BREAST:breasts appear normal, no suspicious masses, no skin or nipple changes or axillary nodes  L breast: scarring in the left axillary region and left axillary tail Incision at 2 o'clock position, about 6 cm from nipple; well-healed LUNGS: clear to auscultation and percussion HEART: occasional ectopy, no murmurs, no gallops, S1 normal and S2 normal ABDOMEN:abdomen soft, non-tender, obese, normal bowel sounds, no masses or organomegaly and no hepatosplenomegaly BACK: Back symmetric, no curvature., No CVA tenderness EXTREMITIES:less then 2 second capillary refill, no joint  deformities, effusion, or inflammation, no edema, no skin discoloration, no clubbing, no cyanosis No significant lymphedema noted NEURO: alert & oriented x 3 with fluent speech, no focal motor/sensory deficits, gait normal   LABORATORY DATA: I have reviewed the data as listed  CBC    Component Value Date/Time   WBC 6.0 10/23/2015 1327   WBC 5.0 07/24/2015 1009   RBC 4.50 10/23/2015 1327   RBC 4.84 07/24/2015 1009   HGB 12.4 10/23/2015 1327   HCT 36.5 10/23/2015 1327   HCT 38.4 07/24/2015 1009   PLT 288 10/23/2015 1327   MCV 81.1 10/23/2015 1327   MCH 27.6 10/23/2015 1327   MCH 26.2* 07/24/2015 1009   MCHC 34.0 10/23/2015 1327   MCHC 33.1 07/24/2015 1009   RDW 13.7 10/23/2015 1327   RDW 14.2 07/24/2015 1009   LYMPHSABS 2.2 10/23/2015 1327   MONOABS 0.3 10/23/2015 1327   EOSABS 0.1 10/23/2015 1327   BASOSABS 0.0  10/23/2015 1327      Chemistry      Component Value Date/Time   NA 136 10/23/2015 1327   NA 140 07/24/2015 1009   K 3.5 10/23/2015 1327   CL 105 10/23/2015 1327   CO2 27 10/23/2015 1327   BUN 15 10/23/2015 1327   BUN 10 07/24/2015 1009   CREATININE 1.03* 10/23/2015 1327      Component Value Date/Time   CALCIUM 8.7* 10/23/2015 1327   ALKPHOS 84 10/23/2015 1327   AST 16 10/23/2015 1327   ALT 14 10/23/2015 1327   BILITOT 0.2* 10/23/2015 1327   BILITOT 0.3 07/24/2015 1009     Lab Results  Component Value Date   LABCA2 31.6 04/30/2015      RADIOGRAPHIC STUDIES: CLINICAL DATA: History of treated left breast cancer, status post lumpectomy, radiation and chemotherapy in 2012.  EXAM: DIGITAL DIAGNOSTIC BILATERAL MAMMOGRAM WITH 3D TOMOSYNTHESIS WITH CAD  ULTRASOUND RIGHT BREAST  COMPARISON: Previous exam(s).  ACR Breast Density Category c: The breast tissue is heterogeneously dense, which may obscure small masses.  FINDINGS: Mammographically, there are no suspicious masses, areas of nonsurgical architectural distortion or  microcalcifications in the left breast. There stable post lumpectomy changes in the upper-outer quadrant of the left breast, and post axillary node dissection changes in the left axilla.  In the right breast, there is a focal asymmetry in the upper outer quadrant, middle depth.  Mammographic images were processed with CAD.  On physical exam, no suspicious masses are found.  Targeted ultrasound is performed, showing right breast 11 o'clock 5 cm from the nipple hypoechoic circumscribed horizontally oriented mass which measures 1.2 x 0.7 by 1.2 cm. Adjacent to it, there is a smaller 0.7 x 0.4 x 0.6 cm mass. These findings have mostly benign sonographic appearance, and likely correspond to the mammographically seen focal asymmetry.  Ultrasound evaluation of the right axilla demonstrates prominent but benign-appearing lymph node.  IMPRESSION: Two probably benign masses in the right breast. Given patient's personal history of breast cancer, ultrasound-guided cyst aspiration versus core needle biopsy is recommended, as these masses do not satisfy the criteria for simple cysts.  RECOMMENDATION: Cyst aspiration versus core needle biopsy of the right breast.  I have discussed the findings and recommendations with the patient. Results were also provided in writing at the conclusion of the visit. If applicable, a reminder letter will be sent to the patient regarding the next appointment.  BI-RADS CATEGORY 3: Probably benign.   Electronically Signed  By: Fidela Salisbury M.D.  On: 09/08/2015 16:49    CLINICAL DATA: Patient presents today for ultrasound-guided aspiration of right breast lesions. History of left lumpectomy in 2012.  EXAM: ULTRASOUND GUIDED RIGHT BREAST CYST ASPIRATION x 2  COMPARISON: 09/08/2015  PROCEDURE: Using sterile technique, 1% lidocaine, under direct ultrasound visualization, needle aspiration of larger lesion in the  10:30 o'clock location of the right breast 3 cm from the nipple was performed. 2 cc of yellow cloudy fluid was aspirated and discarded. The lesion collapsed entirely but refilled during ultrasound visualization, consistent with hemorrhage.  Using the same technique, aspiration was performed of the smaller lesion also in the 10:30 o'clock location of the right breast 3 cm from the nipple. 0.5 cc of yellow cloudy fluid was aspirated and discarded. The lesion collapsed entirely. Biopsy was not necessary.  Compression was applied to ensure hemostasis. The small palpable hematoma was noted, measuring approximately 3-4 cm. The patient was given instructions regarding the hematoma  IMPRESSION: Ultrasound-guided aspiration of 2  right breast cysts. Aspiration complicated by a small right breast hematoma. The patient was given instructions.  RECOMMENDATIONS: Routine diagnostic mammogram recommended in November 2017.   Electronically Signed  By: Nolon Nations M.D.  On: 09/10/2015 16:13   ASSESSMENT:  1. 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 She was treated with dose dense FEC followed by dose dense Taxotere. She had cycle 4 of FEC on 09/14/2011 and completed cycle 4 of dose dense Taxotere on 11/23/2011. She then went on to have surgery by Dr. Osborn Coho on 12/21/2011. She was still found to have a 1 cm high-grade cancer with 3 of 7 positive nodes. LVI was not seen. She had macrometastasis 1.0 cm in one lymph node, 4 mm in another node and she had isolated tumor cells in a third node. Extracapsular extension was seen. She received radiation therapy also.  2. Resumption of menstrual periods in October 2013. 3. BRCA1 and BRCA2 negative. 4. Next screening colonoscopy due in November 2025 by Dr. Laural Golden  Patient Active Problem List    Diagnosis Date Noted  . Positive fecal occult blood test 07/22/2014  . History of breast cancer 07/22/2014  . Rectal bleeding 07/10/2014  . Lymphedema 06/15/2012  . BRCA1 negative 10/22/2011  . BRCA2 negative 10/22/2011  . Breast cancer, IDC, Left, Stage III, Triple negative 06/30/2011    Class: Diagnosis of    PLAN:  She is to continue with her walking.  I discussed the benefits of exercise in reducing breast cancer recurrence. We will schedule her mammogram for November. She is up to date in regards to other well care.  She will return in 6 months for ongoing observation.    I reviewed her laboratory studies with her. I have recommended referring her back to genetics for additional/updated genetics testing, she would like to wait on this.   THERAPY PLAN:  NCCN guidelines recommends the following surveillance for invasive breast cancer:  A. History and Physical exam every 4-6 months for 5 years and then every 12 months.  B. Mammography every 12 months  C. Women on Tamoxifen: annual gynecologic assessment every 12 months if uterus is present.  D. Women on aromatase inhibitor or who experience ovarian failure secondary to treatment should have monitoring of bone health with a bone mineral density determination at baseline and periodically thereafter.  E. Assess and encourage adherence to adjuvant endocrine therapy.  F. Evidence suggests that active lifestyle and achieving and maintaining an ideal body weight (20-25 BMI) may lead to optimal breast cancer outcomes.  All questions were answered. The patient knows to call the clinic with any problems, questions or concerns. We can certainly see the patient much sooner if necessary.   This document serves as a record of services personally performed by Ancil Linsey, MD. It was created on her behalf by Toni Amend, a trained medical scribe. The creation of this record is based on the scribe's personal observations and the provider's  statements to them. This document has been checked and approved by the attending provider.  I have reviewed the above documentation for accuracy and completeness, and I agree with the above.  This note was electronically signed.  Kelby Fam. Whitney Muse, MD

## 2015-10-23 NOTE — Patient Instructions (Addendum)
[place] at [facility] Discharge Instructions  RECOMMENDATIONS MADE BY THE CONSULTANT AND ANY TEST RESULTS WILL BE SENT TO YOUR REFERRING PHYSICIAN.   Exam completed by Dr Whitney Muse today Genetic testing-Karen we will get you scheduled for this (they come once a month for this) Clinical breast exam today Return to see doctor in 6 months Please call the clinic if you have any questions or concerns    Thank you for choosing [place] at [facility] to provide your oncology and hematology care.  To afford each patient quality time with our provider, please arrive at least 15 minutes before your scheduled appointment time.    You need to re-schedule your appointment should you arrive 10 or more minutes late.  We strive to give you quality time with our providers, and arriving late affects you and other patients whose appointments are after yours.  Also, if you no show three or more times for appointments you may be dismissed from the clinic at the providers discretion.     Again, thank you for choosing [facility].  Our hope is that these requests will decrease the amount of time that you wait before being seen by our physicians.       _____________________________________________________________  Should you have questions after your visit to [facility], please contact our office at [phone] between the hours of 8:30 a.m. and 4:30 p.m.  Voicemails left after 4:30 p.m. will not be returned until the following business day.  For prescription refill requests, have your pharmacy contact our office.

## 2015-10-30 ENCOUNTER — Ambulatory Visit: Payer: BLUE CROSS/BLUE SHIELD | Admitting: Adult Health

## 2015-10-31 ENCOUNTER — Ambulatory Visit (HOSPITAL_COMMUNITY): Payer: BLUE CROSS/BLUE SHIELD | Admitting: Hematology & Oncology

## 2015-10-31 ENCOUNTER — Other Ambulatory Visit (HOSPITAL_COMMUNITY): Payer: BLUE CROSS/BLUE SHIELD

## 2015-11-06 ENCOUNTER — Encounter (HOSPITAL_COMMUNITY): Payer: BLUE CROSS/BLUE SHIELD | Admitting: Genetic Counselor

## 2015-11-06 ENCOUNTER — Ambulatory Visit: Payer: Self-pay | Admitting: Genetic Counselor

## 2015-11-06 NOTE — Progress Notes (Signed)
Patient did not show for her appointment.  Please re refer if she is still interested.

## 2015-11-21 ENCOUNTER — Telehealth (HOSPITAL_COMMUNITY): Payer: Self-pay | Admitting: *Deleted

## 2015-11-21 NOTE — Telephone Encounter (Signed)
Pt notified that Jessica Simpson was agreeable to the instructions that I gave to patient regarding lymphedema exercises, wearing sleeve, and monitoring left hand. Pt said ok.

## 2015-11-21 NOTE — Telephone Encounter (Signed)
Pt called to let me know that she had a cold and noticed that her left middle finger was a little tight and swollen. She said it was coming up her left hand a little bit but not up to the wrist. She states that she does her lymphedema exercises some but not all the time. She has a lymphedema sleeve but wears it off and on also. She wanted to know if this was anything to be concerned about. I told her that she needs to watch it and notify us of any changes, to do her exercises, and to wear her sleeve but I also told her that I would ask Dr. Mamie Nick and call pt back. Pt said ok.

## 2015-11-29 ENCOUNTER — Encounter (HOSPITAL_COMMUNITY): Payer: Self-pay | Admitting: Hematology & Oncology

## 2015-11-29 DIAGNOSIS — N6001 Solitary cyst of right breast: Secondary | ICD-10-CM | POA: Insufficient documentation

## 2016-01-15 ENCOUNTER — Ambulatory Visit (INDEPENDENT_AMBULATORY_CARE_PROVIDER_SITE_OTHER): Payer: BLUE CROSS/BLUE SHIELD | Admitting: Adult Health

## 2016-01-15 ENCOUNTER — Encounter: Payer: Self-pay | Admitting: Adult Health

## 2016-01-15 VITALS — BP 164/92 | HR 80 | Ht 65.0 in | Wt 211.0 lb

## 2016-01-15 DIAGNOSIS — Z3202 Encounter for pregnancy test, result negative: Secondary | ICD-10-CM

## 2016-01-15 DIAGNOSIS — Z3009 Encounter for other general counseling and advice on contraception: Secondary | ICD-10-CM

## 2016-01-15 DIAGNOSIS — Z01419 Encounter for gynecological examination (general) (routine) without abnormal findings: Secondary | ICD-10-CM | POA: Insufficient documentation

## 2016-01-15 HISTORY — DX: Encounter for other general counseling and advice on contraception: Z30.09

## 2016-01-15 LAB — POCT URINE PREGNANCY: PREG TEST UR: NEGATIVE

## 2016-01-15 NOTE — Patient Instructions (Signed)
Pt to call with choice of options for birth control

## 2016-01-15 NOTE — Progress Notes (Signed)
Subjective:     Patient ID: ZIARE ROZELL, female   DOB: 09/14/1971, 45 y.o.   MRN: [id number]  HPI Mushka is a 45 year old black female, who has had breast cancer is in to discuss birth control options.  Review of Systems Patient denies any headaches, hearing loss, fatigue, blurred vision, shortness of breath, chest pain, abdominal pain, problems with bowel movements, urination, or intercourse(not having sex). No joint pain or mood swings. Reviewed past medical,surgical, social and family history. Reviewed medications and allergies.     Objective:   Physical Exam BP 164/92 mmHg  Pulse 80  Ht 5\' 5"  (1.651 m)  Wt 211 lb (95.709 kg)  BMI 35.11 kg/m2  LMP 01/01/2016 talk only, she wants to get on birth control ,discussed that sinc she has had breast cancer and even though it was triple negative, can only do non hormonal options, such as condoms, having tubes removed and para gard IUD.Will give handouts on IUD and tubal for her to review and call me back with decision.    Assessment:     Contraceptive ed.    Plan:     Review handouts on tubal and IUD  Call with decision

## 2016-04-22 ENCOUNTER — Encounter (HOSPITAL_COMMUNITY): Payer: Self-pay | Admitting: Oncology

## 2016-04-22 ENCOUNTER — Other Ambulatory Visit (HOSPITAL_COMMUNITY): Payer: BLUE CROSS/BLUE SHIELD

## 2016-04-22 ENCOUNTER — Encounter (HOSPITAL_COMMUNITY): Payer: BLUE CROSS/BLUE SHIELD | Attending: Oncology | Admitting: Oncology

## 2016-04-22 ENCOUNTER — Ambulatory Visit (HOSPITAL_COMMUNITY): Payer: BLUE CROSS/BLUE SHIELD | Admitting: Oncology

## 2016-04-22 VITALS — BP 121/77 | HR 94 | Resp 16 | Wt 207.0 lb

## 2016-04-22 DIAGNOSIS — C50912 Malignant neoplasm of unspecified site of left female breast: Secondary | ICD-10-CM | POA: Diagnosis not present

## 2016-04-22 DIAGNOSIS — Z171 Estrogen receptor negative status [ER-]: Secondary | ICD-10-CM

## 2016-04-22 NOTE — Patient Instructions (Signed)
[place] at [facility] Discharge Instructions  RECOMMENDATIONS MADE BY THE CONSULTANT AND ANY TEST RESULTS WILL BE SENT TO YOUR REFERRING PHYSICIAN.  Exam done and seen today by Kirby Crigler No labs today Labs in 6 months Mammogram in November Follow up with GYN as directed Return to see the Doctor in 6 months for follow up Call the clinic for any concerns or questions.  Thank you for choosing [place] at [facility] to provide your oncology and hematology care.  To afford each patient quality time with our provider, please arrive at least 15 minutes before your scheduled appointment time.   Beginning January 23rd 2017 lab work for the [facility] will be done in the  Main lab at [facility] on 1st floor. If you have a lab appointment with the [facility] please come in thru the  Main Entrance and check in at the main information desk  You need to re-schedule your appointment should you arrive 10 or more minutes late.  We strive to give you quality time with our providers, and arriving late affects you and other patients whose appointments are after yours.  Also, if you no show three or more times for appointments you may be dismissed from the clinic at the providers discretion.     Again, thank you for choosing [facility].  Our hope is that these requests will decrease the amount of time that you wait before being seen by our physicians.       _____________________________________________________________  Should you have questions after your visit to [facility], please contact our office at [phone] between the hours of 8:30 a.m. and 4:30 p.m.  Voicemails left after 4:30 p.m. will not be returned until the following business day.  For prescription refill requests, have your pharmacy contact our office.         Resources For Cancer Patients and their Caregivers ? American Cancer Society: Can assist  with transportation, wigs, general needs, runs Look Good Feel Better.        (973) 518-4265 ? Cancer Care: Provides financial assistance, online support groups, medication/co-pay assistance.  1-800-813-HOPE (806)612-2378) ? [facility] Assists [place] cancer patients and their families through emotional , educational and financial support.  301-752-4381 ? [place] DSS Where to apply for food stamps, Medicaid and utility assistance. 570-555-0374 ? [facility]: Transportation to medical appointments. 2194955320 ? Social Security Administration: May apply for disability if have a Stage IV cancer. (972)160-4332 (740)475-9750 ? [employer], Disability and Transit Services: Assists with nutrition, care and transit needs. [place] Support Programs: @10RELATIVEDAYS @ > Cancer Support Group  2nd Tuesday of the month 1pm-2pm, Journey Room  > Creative Journey  3rd Tuesday of the month 1130am-1pm, Journey Room  > Look Good Feel Better  1st Wednesday of the month 10am-12 noon, Journey Room (Call [place] to register [phone])

## 2016-04-22 NOTE — Assessment & Plan Note (Addendum)
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 She was treated with dose dense FEC followed by dose dense Taxotere. She had cycle 4 of FEC on 09/14/2011 and completed cycle 4 of dose dense Taxotere on 11/23/2011. She then went on to have surgery by Dr. Osborn Coho on 12/21/2011. She was still found to have a 1 cm high-grade cancer with 3 of 7 positive nodes. LVI was not seen. She had macrometastasis 1.0 cm in one lymph node, 4 mm in another node and she had isolated tumor cells in a third node. Extracapsular extension was seen. She received radiation therapy also. BRCA1 and BRCA2 negative.  She notes that she had labs performed about 3 months ago by her Gynecologist.  We will try to get a copy of this and forego labs today.  She notes that she was Vit D deficient at that time and she was started on PO Vit D supplement, but notes that she is not 100% compliant with this medication.  Labs in 6 months: CBC diff, CMET.  I personally reviewed and went over radiographic studies with the patient.  The results are noted within this dictation.  Mammogram was last performed in November 2016 and she is to have her next mammogram in November 2017.  Last year, she had 2 cysts aspirated.  She was referred to [facility] in Jan 2017, but she was a no-show for that appointment.  She is interested in being referred back to [place].  She missed that appointment due to personal issues in Jan 2017 (she did not elaborate).  Chart reviewed.  Gyn appointment with Ms. Laurann Montana is noted.  Patient is to call Gyn with decision regarding tubal ligation versus IUD.  Return in 6 months for follow-up.

## 2016-04-22 NOTE — Progress Notes (Signed)
Default, Provider, MD No address on file  Malignant neoplasm of left female breast, unspecified site of breast ([place]) - Plan: CBC with Differential, Comprehensive metabolic panel, CBC with Differential, Comprehensive metabolic panel, MM DIAG BREAST TOMO BILATERAL, US BREAST LTD UNI RIGHT INC AXILLA, Polysacchar Iron-FA-B12 ([place] 150 FORTE) 150-1-25 MG-MG-MCG CAPS  CURRENT THERAPY: Surveillance per NCCN guidelines  INTERVAL HISTORY: Jessica Simpson 45 y.o. 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 She was treated with dose dense FEC followed by dose dense Taxotere. She had cycle 4 of FEC on 09/14/2011 and completed cycle 4 of dose dense Taxotere on 11/23/2011. She then went on to have surgery by Dr. Osborn Coho on 12/21/2011. She was still found to have a 1 cm high-grade cancer with 3 of 7 positive nodes. LVI was not seen. She had macrometastasis 1.0 cm in one lymph node, 4 mm in another node and she had isolated tumor cells in a third node. Extracapsular extension was seen. She received radiation therapy also.    She denies any complaints today. She denies any changes in her breast tissue. She is performing self breast exams.  She was referred to genetic counseling but unfortunately was a no show for that appointment. This was secondary to personal issues that were ongoing in January 2017. She would not elaborate on these issues with me today. She is instructed in a referral back to genetic counseling. She is provided education regarding genetic counseling and its importance for her ongoing cancer care in addition to her family.  Review of Systems  Constitutional: Negative.  Negative for fever, chills and weight loss.  HENT: Negative.   Eyes: Negative.   Respiratory: Negative.   Cardiovascular:  Negative.   Gastrointestinal: Negative.   Genitourinary: Negative.   Musculoskeletal: Negative.   Skin: Negative.   Neurological: Negative.  Negative for weakness.  Endo/Heme/Allergies: Negative.   Psychiatric/Behavioral: Negative.     Past Medical History  Diagnosis Date  . BRCA1 negative 10/22/2011  . BRCA2 negative 10/22/2011  . Breast cancer, IDC, Left, Stage III, Triple negative 06/30/2011  . Lymphedema of arm   . Lymphedema 06/15/2012  . S/P radiation therapy 2013    50 gray in 25 fractions to the left breast, supraclavicular, and axillary regions. She then received a boost to the left lumpectomy of 10 gray in 5 fractions. This was given at [facility].  . Status post chemotherapy Comp. 11/23/11    FEC and Taxotere  . Fracture     right 4th toe  . Positive fecal occult blood test 07/22/2014  . History of breast cancer 07/22/2014  . Hypertension   . Contraceptive education 01/15/2016    Past Surgical History  Procedure Laterality Date  . Removal breast mass  2004    benign  . Left breast biopsy with axillary biopsy      core bx  . Portacath placement  08/02/2011    dr Margot Chimes  . Tumor removal      on left hand  . Tumor removal      rt. eye  . 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 Breast surgery    . Incision and drainage abscess anal  x3  . Port-a-cath removal  12/21/2011    Procedure: REMOVAL PORT-A-CATH;  Surgeon: Haywood Lasso, MD;  Location: [facility];  Service: General;  Laterality: N/A;  . Evacuation breast hematoma  12/21/2011    Procedure: EVACUATION HEMATOMA BREAST;  Surgeon: Stark Klein, MD;  Location: [facility];  Service: General;  Laterality: Left;  . Hand surgery      tumor on finger, left hand  . Cesarean section    . Treatment fistula anal    . Colonoscopy N/A 08/14/2014    Procedure: COLONOSCOPY;  Surgeon: Rogene Houston, MD;  Location: [facility];  Service:  Endoscopy;  Laterality: N/A;  930    Family History  Problem Relation Age of Onset  . Cancer Father 32    lung cancer   . Cancer Maternal Grandmother     breast  . Colon cancer Neg Hx   . Cancer Maternal Grandfather     Social History   Social History  . Marital Status: Single    Spouse Name: N/A  . Number of Children: N/A  . Years of Education: N/A   Social History Main Topics  . Smoking status: Never Smoker   . Smokeless tobacco: Never Used  . Alcohol Use: No  . Drug Use: No  . Sexual Activity: No   Other Topics Concern  . None   Social History Narrative     PHYSICAL EXAMINATION  ECOG PERFORMANCE STATUS: 0 - Asymptomatic  Filed Vitals:   04/22/16 1000  BP: 121/77  Pulse: 94  Resp: 16    GENERAL:alert, healthy, no distress, well nourished, well developed, comfortable, cooperative, obese, smiling and unaccompanied SKIN: skin color, texture, turgor are normal, no rashes or significant lesions HEAD: Normocephalic, No masses, lesions, tenderness or abnormalities EYES: normal, EOMI, Conjunctiva are pink and non-injected EARS: External ears normal OROPHARYNX:lips, buccal mucosa, and tongue normal and mucous membranes are moist  NECK: supple, no adenopathy, thyroid normal size, non-tender, without nodularity, trachea midline LYMPH:  no palpable lymphadenopathy, no hepatosplenomegaly BREAST: right breast normal without mass, skin or nipple changes or axillary nodes, left breast with surgical site noted without any mass, skin, or nipple changes.  No axillary nodes appreciated. LUNGS: clear to auscultation and percussion HEART: regular rate & rhythm, no murmurs, no gallops, S1 normal and S2 normal ABDOMEN:abdomen soft, non-tender, obese, normal bowel sounds and no masses or organomegaly BACK: Back symmetric, no curvature. EXTREMITIES:less then 2 second capillary refill, no joint deformities, effusion, or inflammation, no skin discoloration, no clubbing, no cyanosis    NEURO: alert & oriented x 3 with fluent speech, no focal motor/sensory deficits, gait normal   LABORATORY DATA: CBC    Component Value Date/Time   WBC 6.0 10/23/2015 1327   WBC 5.0 07/24/2015 1009   RBC 4.50 10/23/2015 1327   RBC 4.84 07/24/2015 1009   HGB 12.4 10/23/2015 1327   HCT 36.5 10/23/2015 1327   HCT 38.4 07/24/2015 1009   PLT 288 10/23/2015 1327   PLT 265 07/24/2015 1009   MCV 81.1 10/23/2015 1327   MCV 79 07/24/2015 1009   MCH 27.6 10/23/2015 1327   MCH 26.2* 07/24/2015 1009   MCHC 34.0 10/23/2015 1327   MCHC 33.1 07/24/2015 1009   RDW 13.7 10/23/2015 1327   RDW 14.2 07/24/2015 1009   LYMPHSABS 2.2 10/23/2015 1327   MONOABS 0.3 10/23/2015 1327   EOSABS 0.1 10/23/2015 1327   BASOSABS 0.0 10/23/2015 1327      Chemistry  Component Value Date/Time   NA 136 10/23/2015 1327   NA 140 07/24/2015 1009   K 3.5 10/23/2015 1327   CL 105 10/23/2015 1327   CO2 27 10/23/2015 1327   BUN 15 10/23/2015 1327   BUN 10 07/24/2015 1009   CREATININE 1.03* 10/23/2015 1327      Component Value Date/Time   CALCIUM 8.7* 10/23/2015 1327   ALKPHOS 84 10/23/2015 1327   AST 16 10/23/2015 1327   ALT 14 10/23/2015 1327   BILITOT 0.2* 10/23/2015 1327   BILITOT 0.3 07/24/2015 1009        PENDING LABS:   RADIOGRAPHIC STUDIES:  No results found.   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 She was treated with dose dense FEC followed by dose dense Taxotere. She had cycle 4 of FEC on 09/14/2011 and completed cycle 4 of dose dense Taxotere on 11/23/2011. She then went on to have surgery by Dr. Osborn Coho on 12/21/2011. She was still found to have a 1 cm high-grade cancer with 3 of 7 positive nodes. LVI was not seen. She had  macrometastasis 1.0 cm in one lymph node, 4 mm in another node and she had isolated tumor cells in a third node. Extracapsular extension was seen. She received radiation therapy also. BRCA1 and BRCA2 negative.  She notes that she had labs performed about 3 months ago by her Gynecologist.  We will try to get a copy of this and forego labs today.  She notes that she was Vit D deficient at that time and she was started on PO Vit D supplement, but notes that she is not 100% compliant with this medication.  Labs in 6 months: CBC diff, CMET.  I personally reviewed and went over radiographic studies with the patient.  The results are noted within this dictation.  Mammogram was last performed in November 2016 and she is to have her next mammogram in November 2017.  Last year, she had 2 cysts aspirated.  She was referred to [facility] in Jan 2017, but she was a no-show for that appointment.  She is interested in being referred back to [facility].  She missed that appointment due to personal issues in Jan 2017 (she did not elaborate).  Chart reviewed.  Gyn appointment with Ms. Laurann Montana is noted.  Patient is to call Gyn with decision regarding tubal ligation versus IUD.  Return in 6 months for follow-up.    ORDERS PLACED FOR THIS ENCOUNTER: Orders Placed This Encounter  Procedures  . MM DIAG BREAST TOMO BILATERAL  . US BREAST LTD UNI RIGHT INC AXILLA  . CBC with Differential  . Comprehensive metabolic panel  . CBC with Differential  . Comprehensive metabolic panel    MEDICATIONS PRESCRIBED THIS ENCOUNTER: No orders of the defined types were placed in this encounter.    THERAPY PLAN:  NCCN guidelines recommends the following surveillance for invasive breast cancer (2.2017):  A. History and Physical exam 1-4 times per year as clinically appropriate for 5 years, then annually.  B. Periodic screening for changes in family history and referral to genetics counseling as indicated  C. Educate,  monitor, and refer to lymphedema management.  D. Mammography every 12 months  E. Routine imaging of reconstructed breast  is not indicated.  F. In the absence of clinical signs and symptoms suggestive of recurrent disease, there is no indication for laboratory or imaging studies for metastases screening.  G. Women on Tamoxifen: annual gynecologic assessment every 12 months if uterus is present.  H. Women on aromatase inhibitor or who experience ovarian failure secondary to treatment should have monitoring of bone health with a bone mineral density determination at baseline and periodically thereafter.  I. Assess and encourage adherence to adjuvant endocrine therapy.  J. Evidence suggests that active lifestyle, healthy diet, limited alcohol intake, and achieving and maintaining an ideal body weight (20-25 BMI) may lead to optimal breast cancer outcomes.  All questions were answered. The patient knows to call the clinic with any problems, questions or concerns. We can certainly see the patient much sooner if necessary.  Patient and plan discussed with Dr. Ancil Linsey and she is in agreement with the aforementioned.   This note is electronically signed by: Doy Mince 04/22/2016 3:54 PM

## 2016-06-03 ENCOUNTER — Other Ambulatory Visit (HOSPITAL_COMMUNITY): Payer: BLUE CROSS/BLUE SHIELD

## 2016-06-03 ENCOUNTER — Encounter (HOSPITAL_COMMUNITY): Payer: BLUE CROSS/BLUE SHIELD | Admitting: Genetic Counselor

## 2016-07-26 ENCOUNTER — Ambulatory Visit (INDEPENDENT_AMBULATORY_CARE_PROVIDER_SITE_OTHER): Payer: BLUE CROSS/BLUE SHIELD | Admitting: Adult Health

## 2016-07-26 ENCOUNTER — Encounter: Payer: Self-pay | Admitting: Adult Health

## 2016-07-26 ENCOUNTER — Other Ambulatory Visit (HOSPITAL_COMMUNITY)
Admission: RE | Admit: 2016-07-26 | Discharge: 2016-07-26 | Disposition: A | Discharge status: Home / Self Care | Payer: BLUE CROSS/BLUE SHIELD | Source: Ambulatory Visit | Attending: Adult Health | Admitting: Adult Health

## 2016-07-26 VITALS — BP 160/80 | HR 72 | Ht 66.0 in | Wt 210.0 lb

## 2016-07-26 DIAGNOSIS — Z853 Personal history of malignant neoplasm of breast: Secondary | ICD-10-CM

## 2016-07-26 DIAGNOSIS — Z01419 Encounter for gynecological examination (general) (routine) without abnormal findings: Secondary | ICD-10-CM | POA: Diagnosis not present

## 2016-07-26 DIAGNOSIS — Z1151 Encounter for screening for human papillomavirus (HPV): Secondary | ICD-10-CM | POA: Insufficient documentation

## 2016-07-26 DIAGNOSIS — Z1212 Encounter for screening for malignant neoplasm of rectum: Secondary | ICD-10-CM | POA: Diagnosis not present

## 2016-07-26 LAB — HEMOCCULT GUIAC POC 1CARD (OFFICE): Fecal Occult Blood, POC: NEGATIVE

## 2016-07-26 NOTE — Progress Notes (Signed)
Patient ID: Jessica Simpson, female   DOB: [birth year], 45 y.o.   MRN: [id number] History of Present Illness: Jessica Simpson is a 45 year old black female in for a well woman gyn exam and pap, no complaints.PCP is Dr Tonette Bihari. She also sees Dr Whitney Muse.    Current Medications, Allergies, Past Medical History, Past Surgical History, Family History and Social History were reviewed in [employer] record.     Review of Systems: Patient denies any headaches, hearing loss, fatigue, blurred vision, shortness of breath, chest pain, abdominal pain, problems with bowel movements, urination, or intercourse. No joint pain or mood swings.    Physical Exam:BP (!) 160/80   Pulse 72   Ht 5\' 6"  (1.676 m)   Wt 210 lb (95.3 kg)   LMP 06/26/2016   BMI 33.89 kg/m  General:  Well developed, well nourished, no acute distress Skin:  Warm and dry Neck:  Midline trachea, normal thyroid, good ROM, no lymphadenopathy Lungs; Clear to auscultation bilaterally Breast:  No dominant palpable mass, retraction, or nipple discharge, has well healed scars  Cardiovascular: Regular rate and rhythm Abdomen:  Soft, non tender, no hepatosplenomegaly Pelvic:  External genitalia is normal in appearance, no lesions.  The vagina is normal in appearance. Urethra has no lesions or masses. The cervix is smooth, pap with HPV performed. Uterus is felt to be normal size, shape, and contour.  No adnexal masses or tenderness noted.Bladder is non tender, no masses felt. Rectal: Good sphincter tone, no polyps, or hemorrhoids felt.  Hemoccult negative. Extremities/musculoskeletal:  No swelling or varicosities noted, no clubbing or cyanosis Psych:  No mood changes, alert and cooperative,seems happy PHQ 2 score 0  Impression: 1. Encounter for routine gynecological examination with Papanicolaou smear of cervix   2. History of breast cancer       Plan: Check CBC,CMP,TSH and lipids,A1c and vitamin D Physical in 1  year, pap in 3 if normal Mammogram yearly

## 2016-07-26 NOTE — Addendum Note (Signed)
Addended by: Linton Rump on: 07/26/2016 05:17 PM   Modules accepted: Orders

## 2016-07-26 NOTE — Patient Instructions (Signed)
Physical in 1 year, pap in 3 years if normal  Mammogram yearly

## 2016-07-27 LAB — TSH: TSH: 1.37 u[IU]/mL (ref 0.450–4.500)

## 2016-07-27 LAB — LIPID PANEL
CHOLESTEROL TOTAL: 212 mg/dL — AB (ref 100–199)
Chol/HDL Ratio: 4.4 ratio units (ref 0.0–4.4)
HDL: 48 mg/dL (ref >39)
LDL CALC: 139 mg/dL — AB (ref 0–99)
Triglycerides: 124 mg/dL (ref 0–149)
VLDL CHOLESTEROL CAL: 25 mg/dL (ref 5–40)

## 2016-07-27 LAB — COMPREHENSIVE METABOLIC PANEL
ALBUMIN: 4.2 g/dL (ref 3.5–5.5)
ALK PHOS: 90 IU/L (ref 39–117)
ALT: 10 IU/L (ref 0–32)
AST: 13 IU/L (ref 0–40)
Albumin/Globulin Ratio: 1.3 (ref 1.2–2.2)
BUN/Creatinine Ratio: 14 (ref 9–23)
BUN: 13 mg/dL (ref 6–24)
Bilirubin Total: 0.2 mg/dL (ref 0.0–1.2)
CALCIUM: 9.2 mg/dL (ref 8.7–10.2)
CO2: 21 mmol/L (ref 18–29)
CREATININE: 0.91 mg/dL (ref 0.57–1.00)
Chloride: 102 mmol/L (ref 96–106)
GFR calc Af Amer: 88 mL/min/{1.73_m2} (ref >59)
GFR, EST NON AFRICAN AMERICAN: 76 mL/min/{1.73_m2} (ref >59)
GLOBULIN, TOTAL: 3.2 g/dL (ref 1.5–4.5)
GLUCOSE: 81 mg/dL (ref 65–99)
Potassium: 3.9 mmol/L (ref 3.5–5.2)
SODIUM: 140 mmol/L (ref 134–144)
Total Protein: 7.4 g/dL (ref 6.0–8.5)

## 2016-07-27 LAB — CBC
HEMATOCRIT: 37.5 % (ref 34.0–46.6)
HEMOGLOBIN: 12.2 g/dL (ref 11.1–15.9)
MCH: 26 pg — ABNORMAL LOW (ref 26.6–33.0)
MCHC: 32.5 g/dL (ref 31.5–35.7)
MCV: 80 fL (ref 79–97)
Platelets: 294 10*3/uL (ref 150–379)
RBC: 4.69 x10E6/uL (ref 3.77–5.28)
RDW: 14 % (ref 12.3–15.4)
WBC: 6.1 10*3/uL (ref 3.4–10.8)

## 2016-07-27 LAB — HEMOGLOBIN A1C
ESTIMATED AVERAGE GLUCOSE: 114 mg/dL
Hgb A1c MFr Bld: 5.6 % (ref 4.8–5.6)

## 2016-07-27 LAB — VITAMIN D 25 HYDROXY (VIT D DEFICIENCY, FRACTURES): Vit D, 25-Hydroxy: 28 ng/mL — ABNORMAL LOW (ref 30.0–100.0)

## 2016-07-28 LAB — CYTOLOGY - PAP

## 2016-07-29 ENCOUNTER — Telehealth: Payer: Self-pay | Admitting: Adult Health

## 2016-07-29 DIAGNOSIS — R7989 Other specified abnormal findings of blood chemistry: Secondary | ICD-10-CM

## 2016-07-29 MED ORDER — CHOLECALCIFEROL 125 MCG (5000 UT) PO CAPS: 5000.0000 [IU] | ORAL_CAPSULE | Freq: Every day | ORAL | Status: DC

## 2016-07-29 NOTE — Telephone Encounter (Signed)
Pt aware of labs, needs to take vitamin D every day and she is aware that creatinine level in elevated and seems to have a steady rise, will make sure she is hydrated and recheck 11/10.

## 2016-09-20 ENCOUNTER — Other Ambulatory Visit (HOSPITAL_COMMUNITY): Payer: Self-pay | Admitting: Oncology

## 2016-09-20 DIAGNOSIS — Z853 Personal history of malignant neoplasm of breast: Secondary | ICD-10-CM

## 2016-09-21 ENCOUNTER — Ambulatory Visit (HOSPITAL_COMMUNITY)
Admission: RE | Admit: 2016-09-21 | Discharge: 2016-09-21 | Disposition: A | Discharge status: Home / Self Care | Payer: BLUE CROSS/BLUE SHIELD | Source: Ambulatory Visit | Attending: Oncology | Admitting: Oncology

## 2016-09-21 DIAGNOSIS — C50912 Malignant neoplasm of unspecified site of left female breast: Secondary | ICD-10-CM | POA: Diagnosis present

## 2016-10-22 ENCOUNTER — Encounter (HOSPITAL_COMMUNITY): Payer: BLUE CROSS/BLUE SHIELD | Attending: Hematology & Oncology | Admitting: Hematology & Oncology

## 2016-10-22 ENCOUNTER — Encounter (HOSPITAL_COMMUNITY): Payer: Self-pay | Admitting: Hematology & Oncology

## 2016-10-22 ENCOUNTER — Encounter (HOSPITAL_COMMUNITY): Payer: BLUE CROSS/BLUE SHIELD

## 2016-10-22 VITALS — BP 156/84 | HR 74 | Temp 97.7°F | Resp 16 | Wt 213.2 lb

## 2016-10-22 DIAGNOSIS — I890 Lymphedema, not elsewhere classified: Secondary | ICD-10-CM

## 2016-10-22 DIAGNOSIS — G4700 Insomnia, unspecified: Secondary | ICD-10-CM

## 2016-10-22 DIAGNOSIS — Z853 Personal history of malignant neoplasm of breast: Secondary | ICD-10-CM | POA: Diagnosis not present

## 2016-10-22 DIAGNOSIS — C50912 Malignant neoplasm of unspecified site of left female breast: Secondary | ICD-10-CM

## 2016-10-22 DIAGNOSIS — Z1371 Encounter for nonprocreative screening for genetic disease carrier status: Secondary | ICD-10-CM

## 2016-10-22 DIAGNOSIS — Z171 Estrogen receptor negative status [ER-]: Secondary | ICD-10-CM

## 2016-10-22 LAB — CBC WITH DIFFERENTIAL/PLATELET
BASOS ABS: 0 10*3/uL (ref 0.0–0.1)
BASOS PCT: 1 %
EOS PCT: 2 %
Eosinophils Absolute: 0.1 10*3/uL (ref 0.0–0.7)
HCT: 38.9 % (ref 36.0–46.0)
Hemoglobin: 12.7 g/dL (ref 12.0–15.0)
Lymphocytes Relative: 34 %
Lymphs Abs: 2 10*3/uL (ref 0.7–4.0)
MCH: 26.6 pg (ref 26.0–34.0)
MCHC: 32.6 g/dL (ref 30.0–36.0)
MCV: 81.6 fL (ref 78.0–100.0)
MONO ABS: 0.3 10*3/uL (ref 0.1–1.0)
Monocytes Relative: 6 %
NEUTROS ABS: 3.5 10*3/uL (ref 1.7–7.7)
Neutrophils Relative %: 57 %
PLATELETS: 302 10*3/uL (ref 150–400)
RBC: 4.77 MIL/uL (ref 3.87–5.11)
RDW: 13.2 % (ref 11.5–15.5)
WBC: 6 10*3/uL (ref 4.0–10.5)

## 2016-10-22 LAB — COMPREHENSIVE METABOLIC PANEL
ALBUMIN: 4.1 g/dL (ref 3.5–5.0)
ALT: 15 U/L (ref 14–54)
AST: 18 U/L (ref 15–41)
Alkaline Phosphatase: 85 U/L (ref 38–126)
Anion gap: 6 (ref 5–15)
BUN: 11 mg/dL (ref 6–20)
CHLORIDE: 101 mmol/L (ref 101–111)
CO2: 28 mmol/L (ref 22–32)
Calcium: 9.1 mg/dL (ref 8.9–10.3)
Creatinine, Ser: 0.91 mg/dL (ref 0.44–1.00)
GFR calc Af Amer: >60 mL/min (ref >60)
GFR calc non Af Amer: >60 mL/min (ref >60)
GLUCOSE: 136 mg/dL — AB (ref 65–99)
POTASSIUM: 3.5 mmol/L (ref 3.5–5.1)
SODIUM: 135 mmol/L (ref 135–145)
Total Bilirubin: 0.6 mg/dL (ref 0.3–1.2)
Total Protein: 7.8 g/dL (ref 6.5–8.1)

## 2016-10-22 NOTE — Progress Notes (Signed)
Default, Provider, MD No address on file  Stage III Triple negative L breast cancer  CURRENT THERAPY: Surveillance per NCCN guidelines  INTERVAL HISTORY: Jessica Simpson 45 y.o. female returns for  regular  visit for followup of Stage III triple negative breast cancer on the left side with a 7- 7.6 cm mass at presentation upper outer quadrant left breast, also a palpable mass in the left axilla that was quite large. She was treated with dose dense FEC followed by dose dense Taxotere. She had cycle 4 of FEC on 09/14/2011 and completed cycle 4 of dose dense Taxotere on 11/23/2011. She then went on to have surgery by Jessica Simpson on 12/21/2011. She was still found to have a 1 cm high-grade cancer with 3 of 7 positive nodes. LVI was not seen. She had macrometastasis 1.0 cm in one lymph node, 4 mm in another node and she had isolated tumor cells in a third node. Extracapsular extension was seen. She received radiation therapy also.   She underwent a colonoscopy by Jessica Simpson on 08/14/2014 that demonstrated a normal colonic exam and she will need her next screening colonoscopy in November 2025  Jessica Simpson 45 y.o. female returns to the [facility] alone today.   She still has regular menstrual cycles. She follows with an OB/GYN. She performs self breast examinations.  She was walking regularly but has not been doing so lately. She had a good Christmas.  She denies smoking or daily alcohol use. She denies headaches or blurry vision. She denies breathing issues, other than a runny nose associated with seasonal allergies.   She reports trouble sleeping. This is an ongoing issue off and on. She will sleep 3 to 4 hours for days then crash, but is has been going on for a while. She has never taken melatonin.  She sees Jessica Simpson for primary care every 6 months. She also goes to the same church as her primary care physician.  She is up to date on all screening studies. She has  her mammograms performed here at [facility].   Past Medical History:  Diagnosis Date  . BRCA1 negative 10/22/2011  . BRCA2 negative 10/22/2011  . Breast cancer, IDC, Left, Stage III, Triple negative 06/30/2011  . Contraceptive education 01/15/2016  . Fracture    right 4th toe  . History of breast cancer 07/22/2014  . Hypertension   . Lymphedema 06/15/2012  . Lymphedema of arm   . Positive fecal occult blood test 07/22/2014  . S/P radiation therapy 2013   50 gray in 25 fractions to the left breast, supraclavicular, and axillary regions. She then received a boost to the left lumpectomy of 10 gray in 5 fractions. This was given at [place].  . Status post chemotherapy Comp. 11/23/11   FEC and Taxotere    has Breast cancer, IDC, Left, Stage III, Triple negative; BRCA1 negative; BRCA2 negative; Lymphedema; Rectal bleeding; Positive fecal occult blood test; History of breast cancer; Benign cyst of right breast; and Contraceptive education on her problem list.     is allergic to codeine.  Jessica Simpson does not currently have medications on file.  Past Surgical History:  Procedure Laterality Date  . 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 BREAST SURGERY    . CESAREAN SECTION    . COLONOSCOPY N/A 08/14/2014   Procedure: COLONOSCOPY;  Surgeon: Jessica Houston, MD;  Location: [facility]  ENDO SUITE;  Service: Endoscopy;  Laterality: N/A;  930  . EVACUATION BREAST HEMATOMA  12/21/2011   Procedure: EVACUATION HEMATOMA BREAST;  Surgeon: Jessica Klein, MD;  Location: [facility];  Service: General;  Laterality: Left;  . HAND SURGERY     tumor on finger, left hand  . INCISION AND DRAINAGE ABSCESS ANAL     x3  . left breast biopsy with axillary biopsy     core bx  . PORT-A-CATH REMOVAL  12/21/2011   Procedure: REMOVAL PORT-A-CATH;  Surgeon: Jessica Lasso, MD;  Location: [place];  Service: General;  Laterality: N/A;  . PORTACATH  PLACEMENT  08/02/2011   dr Jessica Simpson  . removal breast mass  2004   benign  . TREATMENT FISTULA ANAL    . TUMOR REMOVAL     on left hand  . TUMOR REMOVAL     rt. eye    Review of Systems  Constitutional: Negative.   HENT: Negative.        Runny nose associated with seasonal allergies  Eyes: Negative.   Respiratory: Negative.   Cardiovascular: Negative.   Gastrointestinal: Negative.   Genitourinary: Negative.   Musculoskeletal: Negative.   Skin: Negative.   Neurological: Negative.   Endo/Heme/Allergies: Positive for environmental allergies.  Psychiatric/Behavioral: Negative.   All other systems reviewed and are negative. 14 point review of systems was performed and is negative except as detailed under history of present illness and above   PHYSICAL EXAMINATION  ECOG PERFORMANCE STATUS: 0 - Asymptomatic  Vitals:   10/22/16 1051  BP: (!) 156/84  Pulse: 74  Resp: 16  Temp: 97.7 F (36.5 C)    Physical Exam  Constitutional: She is oriented to person, place, and time and well-developed, well-nourished, and in no distress.  Obese, smiling  HENT:  Head: Normocephalic and atraumatic.  Mouth/Throat: Oropharynx is clear and moist.  Eyes: Conjunctivae and EOM are normal. Pupils are equal, round, and reactive to light. No scleral icterus.  Neck: Normal range of motion. Neck supple.  Cardiovascular: Normal rate, regular rhythm and normal heart sounds.   Pulmonary/Chest: Effort normal and breath sounds normal.    Abdominal: Soft. Bowel sounds are normal. She exhibits no distension. There is no tenderness. There is no rebound and no guarding.  Musculoskeletal: Normal range of motion.  Lymphadenopathy:    She has no cervical adenopathy.    She has no axillary adenopathy.  Neurological: She is alert and oriented to person, place, and time. Gait normal.  Skin: Skin is warm and dry.  Psychiatric: Mood, memory, affect and judgment normal.  Nursing note and vitals  reviewed.   LABORATORY DATA: I have reviewed the data as listed  CBC    Component Value Date/Time   WBC 6.0 10/22/2016 1028   RBC 4.77 10/22/2016 1028   HGB 12.7 10/22/2016 1028   HCT 38.9 10/22/2016 1028   HCT 37.5 07/26/2016 1650   PLT 302 10/22/2016 1028   PLT 294 07/26/2016 1650   MCV 81.6 10/22/2016 1028   MCV 80 07/26/2016 1650   MCH 26.6 10/22/2016 1028   MCHC 32.6 10/22/2016 1028   RDW 13.2 10/22/2016 1028   RDW 14.0 07/26/2016 1650   LYMPHSABS 2.0 10/22/2016 1028   MONOABS 0.3 10/22/2016 1028   EOSABS 0.1 10/22/2016 1028   BASOSABS 0.0 10/22/2016 1028      Chemistry      Component Value Date/Time   NA 135 10/22/2016 1028   NA 140 07/26/2016 1650  K 3.5 10/22/2016 1028   CL 101 10/22/2016 1028   CO2 28 10/22/2016 1028   BUN 11 10/22/2016 1028   BUN 13 07/26/2016 1650   CREATININE 0.91 10/22/2016 1028      Component Value Date/Time   CALCIUM 9.1 10/22/2016 1028   ALKPHOS 85 10/22/2016 1028   AST 18 10/22/2016 1028   ALT 15 10/22/2016 1028   BILITOT 0.6 10/22/2016 1028   BILITOT 0.2 07/26/2016 1650     Lab Results  Component Value Date   LABCA2 31.6 04/30/2015    RADIOGRAPHIC STUDIES: I have personally reviewed the radiological images as listed and agreed with the findings in the report.  Study Result   CLINICAL DATA:  History of left breast cancer, status post lumpectomy 2013. Annual exam.  EXAM: 2D DIGITAL DIAGNOSTIC BILATERAL MAMMOGRAM WITH CAD AND ADJUNCT TOMO  COMPARISON:  Previous exam(s).  ACR Breast Density Category c: The breast tissue is heterogeneously dense, which may obscure small masses.  FINDINGS: There are stable lumpectomy changes in the deep upper outer left breast. No mass, nonsurgical distortion, or suspicious microcalcifications identified in either breast to suggest malignancy. There are surgical clips in the left axilla. A ribbon shaped biopsy clip is present in the lower inner right breast.  Mammographic  images were processed with CAD.  IMPRESSION: Lumpectomy changes in left breast. No evidence of malignancy bilaterally.  RECOMMENDATION: Diagnostic mammogram is suggested in 1 year. (Code:DM-B-01Y)  I have discussed the findings and recommendations with the patient. Results were also provided in writing at the conclusion of the visit. If applicable, a reminder letter will be sent to the patient regarding the next appointment.  BI-RADS CATEGORY  2: Benign.   Electronically Signed   By: Curlene Dolphin M.D.   On: 09/21/2016 10:36     ASSESSMENT:  1. 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 She was treated with dose dense FEC followed by dose dense Taxotere. She had cycle 4 of FEC on 09/14/2011 and completed cycle 4 of dose dense Taxotere on 11/23/2011. She then went on to have surgery by Jessica Simpson on 12/21/2011. She was still found to have a 1 cm high-grade cancer with 3 of 7 positive nodes. LVI was not seen. She had macrometastasis 1.0 cm in one lymph node, 4 mm in another node and she had isolated tumor cells in a third node. Extracapsular extension was seen. She received radiation therapy also.  2. Resumption of menstrual periods in October 2013. 3. BRCA1 and BRCA2 negative. 4. Next screening colonoscopy due in November 2025 by Jessica Simpson  Patient Active Problem List   Diagnosis Date Noted  . Contraceptive education 01/15/2016  . Benign cyst of right breast 11/29/2015  . Positive fecal occult blood test 07/22/2014  . History of breast cancer 07/22/2014  . Rectal bleeding 07/10/2014  . Lymphedema 06/15/2012  . BRCA1 negative 10/22/2011  . BRCA2 negative 10/22/2011  . Breast cancer, IDC, Left, Stage III, Triple negative 06/30/2011    Class: Diagnosis of    PLAN:  She is to continue with her walking.  I discussed the  benefits of exercise in reducing breast cancer recurrence. She just had her mammogram in November. She is up to date in regards to other well care.  She will return in 6 months for ongoing observation.  I reviewed her laboratory studies with her. Results are noted above.  I have recommended referring her back to genetics for additional/updated genetics testing, she would like to wait on this.   She is doing well overall.  Breast examination was performed today. Unremarkable for recurrence.   She is having some issue sleeping. I encouraged the patient to try melatonin to help with her sleep.  She is five years out from original diagnosis. Accordingly, her follow up appointments can be spaced out to once yearly with breast examinations. She will continue annual mammograms. She is pre-menopausal and will continue to follow regularly with her ob/gyn.  She will return in 1 year for follow up and breast examination.  THERAPY PLAN:  NCCN guidelines recommends the following surveillance for invasive breast cancer:  A. History and Physical exam every 4-6 months for 5 years and then every 12 months.  B. Mammography every 12 months  C. Women on Tamoxifen: annual gynecologic assessment every 12 months if uterus is present.  D. Women on aromatase inhibitor or who experience ovarian failure secondary to treatment should have monitoring of bone health with a bone mineral density determination at baseline and periodically thereafter.  E. Assess and encourage adherence to adjuvant endocrine therapy.  F. Evidence suggests that active lifestyle and achieving and maintaining an ideal body weight (20-25 BMI) may lead to optimal breast cancer outcomes.  All questions were answered. The patient knows to call the clinic with any problems, questions or concerns. We can certainly see the patient much sooner if necessary.   This document serves as a record of services personally performed by Ancil Linsey, MD.  It was created on her behalf by Arlyce Harman, a trained medical scribe. The creation of this record is based on the scribe's personal observations and the provider's statements to them. This document has been checked and approved by the attending provider.  I have reviewed the above documentation for accuracy and completeness, and I agree with the above.  This note was electronically signed.  Kelby Fam. Whitney Muse, MD

## 2016-10-22 NOTE — Patient Instructions (Addendum)
[facility] at [facility] Discharge Instructions  RECOMMENDATIONS MADE BY THE CONSULTANT AND ANY TEST RESULTS WILL BE SENT TO YOUR REFERRING PHYSICIAN.  You saw Dr.Penland today.  Follow up in one year.  Mammogram in November (prior to follow up)  See Amy at checkout for appointments.  Thank you for choosing [place] at [facility] to provide your oncology and hematology care.  To afford each patient quality time with our provider, please arrive at least 15 minutes before your scheduled appointment time.    If you have a lab appointment with the [facility] please come in thru the  Main Entrance and check in at the main information desk  You need to re-schedule your appointment should you arrive 10 or more minutes late.  We strive to give you quality time with our providers, and arriving late affects you and other patients whose appointments are after yours.  Also, if you no show three or more times for appointments you may be dismissed from the clinic at the providers discretion.     Again, thank you for choosing [facility].  Our hope is that these requests will decrease the amount of time that you wait before being seen by our physicians.       _____________________________________________________________  Should you have questions after your visit to [facility], please contact our office at [phone] between the hours of 8:30 a.m. and 4:30 p.m.  Voicemails left after 4:30 p.m. will not be returned until the following business day.  For prescription refill requests, have your pharmacy contact our office.       Resources For Cancer Patients and their Caregivers ? American Cancer Society: Can assist with transportation, wigs, general needs, runs Look Good Feel Better.        ([phone] ? Cancer Care: Provides financial assistance, online support groups, medication/co-pay assistance.   1-800-813-HOPE 2148658979) ? [facility] Assists [place] cancer patients and their families through emotional , educational and financial support.  251-509-9619 ? [place] DSS Where to apply for food stamps, Medicaid and utility assistance. (956)048-9894 ? RCATS: Transportation to medical appointments. 205-554-4743 ? Social Security Administration: May apply for disability if have a Stage IV cancer. ([phone] [phone] ? [employer], Disability and Transit Services: Assists with nutrition, care and transit needs. [place] Support Programs: @10RELATIVEDAYS @ > Cancer Support Group  2nd Tuesday of the month 1pm-2pm, Journey Room  > Creative Journey  3rd Tuesday of the month 1130am-1pm, Journey Room  > Look Good Feel Better  1st Wednesday of the month 10am-12 noon, Journey Room (Call [facility] to register [phone])

## 2016-11-23 ENCOUNTER — Encounter (HOSPITAL_COMMUNITY): Payer: Self-pay | Admitting: Hematology & Oncology

## 2017-08-09 ENCOUNTER — Other Ambulatory Visit (HOSPITAL_COMMUNITY): Payer: Self-pay | Admitting: Oncology

## 2017-08-09 DIAGNOSIS — Z853 Personal history of malignant neoplasm of breast: Secondary | ICD-10-CM

## 2017-08-24 ENCOUNTER — Ambulatory Visit (INDEPENDENT_AMBULATORY_CARE_PROVIDER_SITE_OTHER): Payer: BLUE CROSS/BLUE SHIELD | Admitting: Adult Health

## 2017-08-24 ENCOUNTER — Encounter: Payer: Self-pay | Admitting: Adult Health

## 2017-08-24 ENCOUNTER — Encounter (INDEPENDENT_AMBULATORY_CARE_PROVIDER_SITE_OTHER): Payer: Self-pay

## 2017-08-24 VITALS — BP 160/90 | HR 88 | Ht 65.5 in | Wt 220.5 lb

## 2017-08-24 DIAGNOSIS — R309 Painful micturition, unspecified: Secondary | ICD-10-CM

## 2017-08-24 DIAGNOSIS — Z1211 Encounter for screening for malignant neoplasm of colon: Secondary | ICD-10-CM

## 2017-08-24 DIAGNOSIS — R350 Frequency of micturition: Secondary | ICD-10-CM | POA: Insufficient documentation

## 2017-08-24 DIAGNOSIS — Z1212 Encounter for screening for malignant neoplasm of rectum: Secondary | ICD-10-CM

## 2017-08-24 DIAGNOSIS — Z01419 Encounter for gynecological examination (general) (routine) without abnormal findings: Secondary | ICD-10-CM

## 2017-08-24 DIAGNOSIS — N6331 Unspecified lump in axillary tail of the right breast: Secondary | ICD-10-CM

## 2017-08-24 DIAGNOSIS — R319 Hematuria, unspecified: Secondary | ICD-10-CM | POA: Diagnosis not present

## 2017-08-24 DIAGNOSIS — Z853 Personal history of malignant neoplasm of breast: Secondary | ICD-10-CM

## 2017-08-24 DIAGNOSIS — Z01411 Encounter for gynecological examination (general) (routine) with abnormal findings: Secondary | ICD-10-CM

## 2017-08-24 LAB — POCT URINALYSIS DIPSTICK
Glucose, UA: NEGATIVE
KETONES UA: NEGATIVE
LEUKOCYTES UA: NEGATIVE
Nitrite, UA: NEGATIVE
PROTEIN UA: NEGATIVE

## 2017-08-24 LAB — HEMOCCULT GUIAC POC 1CARD (OFFICE): Fecal Occult Blood, POC: NEGATIVE

## 2017-08-24 NOTE — Progress Notes (Signed)
Patient ID: Jessica Simpson, female   DOB: 02/10/1971, 46 y.o.   MRN: [id number] History of Present Illness: Jessica Simpson is a 46 year old black female in for a well woman gyn exam, she had a normal pap with negative HPV 07/26/16.     Current Medications, Allergies, Past Medical History, Past Surgical History, Family History and Social History were reviewed in [employer] record.     Review of Systems: Patient denies any headaches, hearing loss, fatigue, blurred vision, shortness of breath, chest pain, abdominal pain, problems with bowel movements,  or intercourse. No joint pain or mood swings. +urinary frequency   Physical Exam:BP (!) 160/90 (BP Location: Right Arm, Patient Position: Sitting, Cuff Size: Large)   Pulse 88   Ht 5' 5.5" (1.664 m)   Wt 220 lb 8 oz (100 kg)   LMP 08/08/2017   BMI 36.14 kg/m urine dipstick trace blood General:  Well developed, well nourished, no acute distress Skin:  Warm and dry Neck:  Midline trachea, normal thyroid, good ROM, no lymphadenopathy Lungs; Clear to auscultation bilaterally Breast:  No dominant palpable mass, retraction, or nipple discharge on left, on right no retraction or nipple discharge but has 2 cm tender mass at about 11 o'clock about 5 FB from nipple in tail of breast under axilla, ?lymph node  Cardiovascular: Regular rate and rhythm Abdomen:  Soft, non tender, no hepatosplenomegaly Pelvic:  External genitalia is normal in appearance, no lesions.  The vagina is normal in appearance. Urethra has no lesions or masses. The cervix is bulbous.  Uterus is felt to be normal size, shape, and contour.  No adnexal masses or tenderness noted.Bladder is non tender, no masses felt. Rectal: Good sphincter tone, no polyps, or hemorrhoids felt.  Hemoccult negative. Extremities/musculoskeletal:  No swelling or varicosities noted, no clubbing or cyanosis Psych:  No mood changes, alert and cooperative,seems happy PHQ 2 score  0.  Impression:  1. Encounter for well woman exam with routine gynecological exam   2. Urinary frequency   3. Pain with urination   4. Screening for colorectal cancer   5. History of breast cancer   6. Mass of axillary tail of right breast   7. Hematuria, unspecified type      Plan: UA C&S sent Pt had [place] scheduled for 11/30 at [facility] will add Right and left [place] to order. Physical in 1 year Pap in 2020

## 2017-08-24 NOTE — Patient Instructions (Signed)
Physical in 1 year 

## 2017-08-25 LAB — MICROSCOPIC EXAMINATION: Casts: NONE SEEN /lpf

## 2017-08-25 LAB — URINALYSIS, ROUTINE W REFLEX MICROSCOPIC
Bilirubin, UA: NEGATIVE
Glucose, UA: NEGATIVE
KETONES UA: NEGATIVE
NITRITE UA: NEGATIVE
PROTEIN UA: NEGATIVE
RBC, UA: NEGATIVE
SPEC GRAV UA: 1.01 (ref 1.005–1.030)
UUROB: 0.2 mg/dL (ref 0.2–1.0)
pH, UA: 5.5 (ref 5.0–7.5)

## 2017-08-25 LAB — URINE CULTURE

## 2017-08-26 ENCOUNTER — Telehealth: Payer: Self-pay | Admitting: Adult Health

## 2017-08-26 NOTE — Telephone Encounter (Signed)
Pt aware urine had no blood and culture did not grow out anything

## 2017-09-23 ENCOUNTER — Ambulatory Visit
Admission: RE | Admit: 2017-09-23 | Discharge: 2017-09-23 | Disposition: A | Discharge status: Home / Self Care | Payer: BLUE CROSS/BLUE SHIELD | Source: Ambulatory Visit | Attending: Oncology | Admitting: Oncology

## 2017-09-23 ENCOUNTER — Ambulatory Visit
Admission: RE | Admit: 2017-09-23 | Discharge: 2017-09-23 | Disposition: A | Discharge status: Home / Self Care | Payer: BLUE CROSS/BLUE SHIELD | Source: Ambulatory Visit | Attending: Adult Health | Admitting: Adult Health

## 2017-09-23 ENCOUNTER — Other Ambulatory Visit (HOSPITAL_COMMUNITY): Payer: Self-pay | Admitting: Oncology

## 2017-09-23 DIAGNOSIS — R921 Mammographic calcification found on diagnostic imaging of breast: Secondary | ICD-10-CM

## 2017-09-23 DIAGNOSIS — R2231 Localized swelling, mass and lump, right upper limb: Secondary | ICD-10-CM

## 2017-09-23 DIAGNOSIS — N6331 Unspecified lump in axillary tail of the right breast: Secondary | ICD-10-CM

## 2017-09-23 DIAGNOSIS — Z853 Personal history of malignant neoplasm of breast: Secondary | ICD-10-CM

## 2017-09-23 HISTORY — DX: Personal history of antineoplastic chemotherapy: Z92.21

## 2017-09-23 HISTORY — DX: Personal history of irradiation: Z92.3

## 2017-09-26 ENCOUNTER — Other Ambulatory Visit (HOSPITAL_COMMUNITY): Payer: Self-pay | Admitting: Oncology

## 2017-09-26 ENCOUNTER — Ambulatory Visit
Admission: RE | Admit: 2017-09-26 | Discharge: 2017-09-26 | Disposition: A | Discharge status: Home / Self Care | Payer: BLUE CROSS/BLUE SHIELD | Source: Ambulatory Visit | Attending: Adult Health | Admitting: Adult Health

## 2017-09-26 DIAGNOSIS — R921 Mammographic calcification found on diagnostic imaging of breast: Secondary | ICD-10-CM

## 2017-09-26 DIAGNOSIS — R2231 Localized swelling, mass and lump, right upper limb: Secondary | ICD-10-CM

## 2017-09-27 ENCOUNTER — Encounter (HOSPITAL_COMMUNITY): Payer: BLUE CROSS/BLUE SHIELD

## 2017-09-27 ENCOUNTER — Telehealth (HOSPITAL_COMMUNITY): Payer: Self-pay | Admitting: Emergency Medicine

## 2017-09-27 ENCOUNTER — Other Ambulatory Visit (HOSPITAL_COMMUNITY): Payer: Self-pay | Admitting: Emergency Medicine

## 2017-09-27 DIAGNOSIS — C773 Secondary and unspecified malignant neoplasm of axilla and upper limb lymph nodes: Principal | ICD-10-CM

## 2017-09-27 DIAGNOSIS — C50919 Malignant neoplasm of unspecified site of unspecified female breast: Secondary | ICD-10-CM

## 2017-09-27 NOTE — Telephone Encounter (Signed)
Jessica Simpson from [facility] notified RN that pts pathology has resulted with lymph node with metastatic disease present.  RN reviewed with Dr Talbert Cage.  Orders received for restaging PET scan, labs and follow up office visit.  Called Jessica Simpson back with these appts.  Jessica Simpson is going to notify pt of these appts.  Jessica Simpson will provide pt with nurse navigators number here at [facility] if she has any additional questions.

## 2017-10-12 ENCOUNTER — Other Ambulatory Visit (HOSPITAL_COMMUNITY): Payer: Self-pay | Admitting: Oncology

## 2017-10-12 ENCOUNTER — Encounter (HOSPITAL_COMMUNITY)
Admission: RE | Admit: 2017-10-12 | Discharge: 2017-10-12 | Disposition: A | Discharge status: Home / Self Care | Payer: BLUE CROSS/BLUE SHIELD | Source: Ambulatory Visit | Attending: Oncology | Admitting: Oncology

## 2017-10-12 DIAGNOSIS — C50919 Malignant neoplasm of unspecified site of unspecified female breast: Secondary | ICD-10-CM

## 2017-10-12 DIAGNOSIS — C773 Secondary and unspecified malignant neoplasm of axilla and upper limb lymph nodes: Secondary | ICD-10-CM | POA: Diagnosis present

## 2017-10-12 LAB — GLUCOSE, CAPILLARY: GLUCOSE-CAPILLARY: 96 mg/dL (ref 65–99)

## 2017-10-12 MED ORDER — FLUDEOXYGLUCOSE F - 18 (FDG) INJECTION
10.1900 | Freq: Once | INTRAVENOUS | Status: AC | PRN
  Administered 2017-10-12: 10.19 via INTRAVENOUS

## 2017-10-14 ENCOUNTER — Encounter (HOSPITAL_COMMUNITY): Payer: Self-pay | Admitting: Oncology

## 2017-10-14 ENCOUNTER — Encounter (HOSPITAL_COMMUNITY): Payer: BLUE CROSS/BLUE SHIELD

## 2017-10-14 ENCOUNTER — Other Ambulatory Visit: Payer: Self-pay

## 2017-10-14 ENCOUNTER — Encounter (HOSPITAL_COMMUNITY): Payer: BLUE CROSS/BLUE SHIELD | Attending: Oncology | Admitting: Oncology

## 2017-10-14 VITALS — BP 189/101 | HR 97 | Temp 98.4°F | Resp 18 | Wt 213.7 lb

## 2017-10-14 DIAGNOSIS — C50412 Malignant neoplasm of upper-outer quadrant of left female breast: Secondary | ICD-10-CM | POA: Diagnosis not present

## 2017-10-14 DIAGNOSIS — C773 Secondary and unspecified malignant neoplasm of axilla and upper limb lymph nodes: Principal | ICD-10-CM

## 2017-10-14 DIAGNOSIS — C50919 Malignant neoplasm of unspecified site of unspecified female breast: Secondary | ICD-10-CM | POA: Insufficient documentation

## 2017-10-14 DIAGNOSIS — Z171 Estrogen receptor negative status [ER-]: Secondary | ICD-10-CM

## 2017-10-14 LAB — CBC WITH DIFFERENTIAL/PLATELET
BASOS ABS: 0 10*3/uL (ref 0.0–0.1)
BASOS PCT: 0 %
EOS PCT: 1 %
Eosinophils Absolute: 0 10*3/uL (ref 0.0–0.7)
HCT: 40.4 % (ref 36.0–46.0)
Hemoglobin: 12.9 g/dL (ref 12.0–15.0)
Lymphocytes Relative: 29 %
Lymphs Abs: 1.9 10*3/uL (ref 0.7–4.0)
MCH: 26.1 pg (ref 26.0–34.0)
MCHC: 31.9 g/dL (ref 30.0–36.0)
MCV: 81.8 fL (ref 78.0–100.0)
MONO ABS: 0.4 10*3/uL (ref 0.1–1.0)
Monocytes Relative: 6 %
NEUTROS ABS: 4.1 10*3/uL (ref 1.7–7.7)
Neutrophils Relative %: 64 %
PLATELETS: 330 10*3/uL (ref 150–400)
RBC: 4.94 MIL/uL (ref 3.87–5.11)
RDW: 13.6 % (ref 11.5–15.5)
WBC: 6.4 10*3/uL (ref 4.0–10.5)

## 2017-10-14 LAB — COMPREHENSIVE METABOLIC PANEL
ALBUMIN: 4.1 g/dL (ref 3.5–5.0)
ALT: 15 U/L (ref 14–54)
AST: 18 U/L (ref 15–41)
Alkaline Phosphatase: 95 U/L (ref 38–126)
Anion gap: 11 (ref 5–15)
BUN: 10 mg/dL (ref 6–20)
CHLORIDE: 102 mmol/L (ref 101–111)
CO2: 24 mmol/L (ref 22–32)
Calcium: 9.4 mg/dL (ref 8.9–10.3)
Creatinine, Ser: 0.96 mg/dL (ref 0.44–1.00)
GFR calc Af Amer: >60 mL/min (ref >60)
GFR calc non Af Amer: >60 mL/min (ref >60)
GLUCOSE: 132 mg/dL — AB (ref 65–99)
POTASSIUM: 3.3 mmol/L — AB (ref 3.5–5.1)
Sodium: 137 mmol/L (ref 135–145)
Total Bilirubin: 0.4 mg/dL (ref 0.3–1.2)
Total Protein: 7.8 g/dL (ref 6.5–8.1)

## 2017-10-14 NOTE — Patient Instructions (Signed)
[facility] at [facility] Discharge Instructions  RECOMMENDATIONS MADE BY THE CONSULTANT AND ANY TEST RESULTS WILL BE SENT TO YOUR REFERRING PHYSICIAN.  You were seen today by Dr. Louise Zhou    Thank you for choosing [facility] at [facility] to provide your oncology and hematology care.  To afford each patient quality time with our provider, please arrive at least 15 minutes before your scheduled appointment time.    If you have a lab appointment with the Cancer Center please come in thru the  Main Entrance and check in at the main information desk  You need to re-schedule your appointment should you arrive 10 or more minutes late.  We strive to give you quality time with our providers, and arriving late affects you and other patients whose appointments are after yours.  Also, if you no show three or more times for appointments you may be dismissed from the clinic at the providers discretion.     Again, thank you for choosing [facility].  Our hope is that these requests will decrease the amount of time that you wait before being seen by our physicians.       _____________________________________________________________  Should you have questions after your visit to [facility], please contact our office at [phone] between the hours of 8:30 a.m. and 4:30 p.m.  Voicemails left after 4:30 p.m. will not be returned until the following business day.  For prescription refill requests, have your pharmacy contact our office.       Resources For Cancer Patients and their Caregivers ? American Cancer Society: Can assist with transportation, wigs, general needs, runs Look Good Feel Better.        1-888-227-6333 ? Cancer Care: Provides financial assistance, online support groups, medication/co-pay assistance.  1-800-813-HOPE (4673) ? [facility] Assists [place] cancer patients and their  families through emotional , educational and financial support.  336-427-4357 ? [place] DSS Where to apply for food stamps, Medicaid and utility assistance. 336-342-1394 ? RCATS: Transportation to medical appointments. 336-347-2287 ? Social Security Administration: May apply for disability if have a Stage IV cancer. 336-342-7796 1-800-772-1213 ? [place] Aging, Disability and Transit Services: Assists with nutrition, care and transit needs. 336-349-2343  Cancer Center Support Programs: @10RELATIVEDAYS@ > Cancer Support Group  2nd Tuesday of the month 1pm-2pm, Journey Room  > Creative Journey  3rd Tuesday of the month 1130am-1pm, Journey Room  > Look Good Feel Better  1st Wednesday of the month 10am-12 noon, Journey Room (Call American Cancer Society to register 1-800-395-5775)    

## 2017-10-14 NOTE — Progress Notes (Signed)
Patient, [name] No address on file  Stage III Triple negative L breast cancer  CURRENT THERAPY: Surveillance per NCCN guidelines  INTERVAL HISTORY: JOHNANNA Simpson 46 y.o. female returns for  regular  visit for followup of Stage III triple negative breast cancer on the left side with a 7- 7.6 cm mass at presentation upper outer quadrant left breast, also a palpable mass in the left axilla that was quite large. She was treated with dose dense FEC followed by dose dense Taxotere. She had cycle 4 of FEC on 09/14/2011 and completed cycle 4 of dose dense Taxotere on 11/23/2011. She then went on to have surgery by Dr. Osborn Coho on 12/21/2011. She was still found to have a 1 cm high-grade cancer with 3 of 7 positive nodes. LVI was not seen. She had macrometastasis 1.0 cm in one lymph node, 4 mm in another node and she had isolated tumor cells in a third node. Extracapsular extension was seen. She received radiation therapy also.   She underwent a colonoscopy by Dr. Laural Golden on 08/14/2014 that demonstrated a normal colonic exam and she will need her next screening colonoscopy in November 2025  10/14/17: Patient presents today for follow-up for her newly diagnosed recurrent triple negative breast cancer.    Patient underwent bilateral diagnostic mammogram on 09/23/2017, after she palpated a mass in her RUQ quadrant/lower axillar region, which demonstrated morphologically abnormal lymph node in the RIGHT axillary tail, corresponding to the palpable area of concern.  Ultrasound-guided biopsy is recommended. New grouped pleomorphic calcifications at the lumpectomy site within the upper-outer quadrant of the left breast, indeterminate, spanning 6 mm. Stereotactic biopsy is recommended.   Biopsy was performed on 09/26/2017 and demonstrated stereotactic left core needle biopsy of the lumpectomy bed in the left upper outer quadrant demonstrated fibrosis, fat necorsis and calcification with no  malignancy identified.  Core needle biopsy right axillary lymph biopsy demonstrated high-grade triple negative carcinoma similar in appearance to the patient's prior ductal carcinoma.    Patient had a restaging PET/CT performed on 10/12/2017 which demonstrated hypermetabolic 1.3 cm right axillary lymph node, maximum SUV 7.0, concerning for recurrent malignancy. Postoperative findings in the left lateral breast from prior lumpectomy. She was also found to have accentuated activity projecting over the right ovary, although conceivably this might be activity in the immediately adjacent right ureter. Pelvic sonography recommended for definitive characterization of the right ovary. If there is a significantly abnormal right ovarian lesion sonography, the possibility of a hypermetabolic small ovarian mass must be considered.  Patient states that overall she feels well.  She denies any chest pain, shortness of breath, abdominal pain, nausea, vomiting, diarrhea, weight loss, focal weakness, recent infections.  She continues to work full-time.  Past Medical History:  Diagnosis Date  . BRCA1 negative 10/22/2011  . BRCA2 negative 10/22/2011  . Breast cancer, IDC, Left, Stage III, Triple negative 06/30/2011  . Contraceptive education 01/15/2016  . Fracture    right 4th toe  . History of breast cancer 07/22/2014  . Hypertension   . Lymphedema 06/15/2012  . Lymphedema of arm   . Personal history of chemotherapy   . Personal history of radiation therapy   . Positive fecal occult blood test 07/22/2014  . S/P radiation therapy 2013   50 gray in 25 fractions to the left breast, supraclavicular, and axillary regions. She then received a boost to the left lumpectomy of 10 gray in 5 fractions. This was given at [facility].  . Status post chemotherapy Comp. 11/23/11   FEC and Taxotere    has Breast cancer, IDC, Left, Stage III, Triple negative; BRCA1 negative; BRCA2 negative;  Lymphedema; Rectal bleeding; Positive fecal occult blood test; History of breast cancer; Benign cyst of right breast; Encounter for well woman exam with routine gynecological exam; Mass of axillary tail of right breast; Pain with urination; Urinary frequency; and Hematuria on their problem list.     is allergic to codeine.  Jessica Simpson had no medications administered during this visit.  Past Surgical History:  Procedure Laterality Date  . 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 BREAST LUMPECTOMY    . BREAST SURGERY    . CESAREAN SECTION    . COLONOSCOPY N/A 08/14/2014   Procedure: COLONOSCOPY;  Surgeon: Rogene Houston, MD;  [facility];  Service: Endoscopy;  Laterality: N/A;  930  . EVACUATION BREAST HEMATOMA  12/21/2011   Procedure: EVACUATION HEMATOMA BREAST;  Surgeon: Stark Klein, MD;  Location: [place];  Service: General;  Laterality: Left;  . HAND SURGERY     tumor on finger, left hand  . INCISION AND DRAINAGE ABSCESS ANAL     x3  . left breast biopsy with axillary biopsy     core bx  . PORT-A-CATH REMOVAL  12/21/2011   Procedure: REMOVAL PORT-A-CATH;  Surgeon: Haywood Lasso, MD;  Location: [place];  Service: General;  Laterality: N/A;  . PORTACATH PLACEMENT  08/02/2011   dr Margot Chimes  . removal breast mass  2004   benign  . TREATMENT FISTULA ANAL    . TUMOR REMOVAL     on left hand  . TUMOR REMOVAL     rt. eye    Review of Systems  Constitutional: Negative.   HENT: Negative.   Eyes: Negative.   Respiratory: Negative.   Cardiovascular: Negative.   Gastrointestinal: Negative.   Genitourinary: Negative.   Musculoskeletal: Negative.   Skin: Negative.   Neurological: Negative.   Endo/Heme/Allergies: Negative for environmental allergies.  Psychiatric/Behavioral: Negative.   All other systems reviewed and are negative. 14 point review of systems was performed and is negative except as detailed under  history of present illness and above   PHYSICAL EXAMINATION  ECOG PERFORMANCE STATUS: 0 - Asymptomatic  Vitals:   10/14/17 1146  BP: (!) 189/101  Pulse: 97  Resp: 18  Temp: 98.4 F (36.9 C)  SpO2: 100%    Physical Exam  Constitutional: She is oriented to person, place, and time and well-developed, well-nourished, and in no distress.  HENT:  Head: Normocephalic and atraumatic.  Mouth/Throat: Oropharynx is clear and moist.  Eyes: Conjunctivae and EOM are normal. Pupils are equal, round, and reactive to light. No scleral icterus.  Neck: Normal range of motion. Neck supple.  Cardiovascular: Normal rate, regular rhythm and normal heart sounds.  Pulmonary/Chest: Effort normal and breath sounds normal.    Abdominal: Soft. Bowel sounds are normal. She exhibits no distension. There is no tenderness. There is no rebound and no guarding.  Musculoskeletal: Normal range of motion.  Lymphadenopathy:    She has no cervical adenopathy.    She has no axillary adenopathy.  Neurological: She is alert and oriented to person, place, and time. Gait normal.  Skin: Skin is warm and dry.  Psychiatric: Mood, memory, affect and judgment normal.  Nursing note and vitals reviewed.   LABORATORY DATA: I have reviewed the data  as listed  CBC    Component Value Date/Time   WBC 6.4 10/14/2017 1058   RBC 4.94 10/14/2017 1058   HGB 12.9 10/14/2017 1058   HGB 12.2 07/26/2016 1650   HCT 40.4 10/14/2017 1058   HCT 37.5 07/26/2016 1650   PLT 330 10/14/2017 1058   PLT 294 07/26/2016 1650   MCV 81.8 10/14/2017 1058   MCV 80 07/26/2016 1650   MCH 26.1 10/14/2017 1058   MCHC 31.9 10/14/2017 1058   RDW 13.6 10/14/2017 1058   RDW 14.0 07/26/2016 1650   LYMPHSABS 1.9 10/14/2017 1058   MONOABS 0.4 10/14/2017 1058   EOSABS 0.0 10/14/2017 1058   BASOSABS 0.0 10/14/2017 1058      Chemistry      Component Value Date/Time   NA 137 10/14/2017 1058   NA 140 07/26/2016 1650   K 3.3 (L) 10/14/2017 1058     CL 102 10/14/2017 1058   CO2 24 10/14/2017 1058   BUN 10 10/14/2017 1058   BUN 13 07/26/2016 1650   CREATININE 0.96 10/14/2017 1058      Component Value Date/Time   CALCIUM 9.4 10/14/2017 1058   ALKPHOS 95 10/14/2017 1058   AST 18 10/14/2017 1058   ALT 15 10/14/2017 1058   BILITOT 0.4 10/14/2017 1058   BILITOT 0.2 07/26/2016 1650     Lab Results  Component Value Date   LABCA2 31.6 04/30/2015    RADIOGRAPHIC STUDIES: I have personally reviewed the radiological images as listed and agreed with the findings in the report.  Study Result   CLINICAL DATA:  History of left breast cancer, status post lumpectomy 2013. Annual exam.  EXAM: 2D DIGITAL DIAGNOSTIC BILATERAL MAMMOGRAM WITH CAD AND ADJUNCT TOMO  COMPARISON:  Previous exam(s).  ACR Breast Density Category c: The breast tissue is heterogeneously dense, which may obscure small masses.  FINDINGS: There are stable lumpectomy changes in the deep upper outer left breast. No mass, nonsurgical distortion, or suspicious microcalcifications identified in either breast to suggest malignancy. There are surgical clips in the left axilla. A ribbon shaped biopsy clip is present in the lower inner right breast.  Mammographic images were processed with CAD.  IMPRESSION: Lumpectomy changes in left breast. No evidence of malignancy bilaterally.  RECOMMENDATION: Diagnostic mammogram is suggested in 1 year. (Code:[id number]-B-01Y)  I have discussed the findings and recommendations with the patient. Results were also provided in writing at the conclusion of the visit. If applicable, a reminder letter will be sent to the patient regarding the next appointment.  BI-RADS CATEGORY  2: Benign.   Electronically Signed   By: Curlene Dolphin M.D.   On: 09/21/2016 10:36     ASSESSMENT:  1. 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 She was treated with dose dense FEC followed by dose dense Taxotere. She had cycle 4 of FEC on 09/14/2011 and completed cycle 4 of dose dense Taxotere on 11/23/2011. She then went on to have surgery by Dr. Osborn Coho on 12/21/2011. She was still found to have a 1 cm high-grade cancer with 3 of 7 positive nodes. LVI was not seen. She had macrometastasis 1.0 cm in one lymph node, 4 mm in another node and she had isolated tumor cells in a third node. Extracapsular extension was seen. She received radiation therapy also.  2. Resumption of menstrual  periods in October 2013. 3. BRCA1 and BRCA2 negative. 4. Recurrent triple negative breast cancer with PET positive axillary lymph node diagnosed 09/26/17.  Patient Active Problem List   Diagnosis Date Noted  . Mass of axillary tail of right breast 08/24/2017  . Pain with urination 08/24/2017  . Urinary frequency 08/24/2017  . Hematuria 08/24/2017  . Encounter for well woman exam with routine gynecological exam 01/15/2016  . Benign cyst of right breast 11/29/2015  . Positive fecal occult blood test 07/22/2014  . History of breast cancer 07/22/2014  . Rectal bleeding 07/10/2014  . Lymphedema 06/15/2012  . BRCA1 negative 10/22/2011  . BRCA2 negative 10/22/2011  . Breast cancer, IDC, Left, Stage III, Triple negative 06/30/2011    Class: Diagnosis of    PLAN:  -I have reviewed patient's biopsy results and PET/CT in detail with her today.  Her only evidence of recurrent disease is in the right axilla.  I will send her to breast surgeon regarding right axillary node dissection and placement of chemoport.  Patient states that she would like to find a surgeon who is in her network, therefore she will get back to me.   -I have discussed with her since she does have recurrent metastatic disease she will need to start chemotherapy.  I have briefly discussed dose dense AC q2weeks for 4 cycles followed by  weekly Taxol for 12 weeks with her. Will discuss more on next visit. -As for the questionable ovarian lesion seen on PET, she will need a transvaginal US performed and follow up with GYN once she completes chemo. -RTC in 6 weeks for follow up to discuss initiating chemotherapy.  All questions were answered. The patient knows to call the clinic with any problems, questions or concerns. We can certainly see the patient much sooner if necessary.   Twana First, MD

## 2017-10-15 LAB — CANCER ANTIGEN 15-3: CA 15-3: 29.1 U/mL — ABNORMAL HIGH (ref 0.0–25.0)

## 2017-10-15 LAB — CANCER ANTIGEN 27.29: CA 27.29: 38.7 U/mL — ABNORMAL HIGH (ref 0.0–38.6)

## 2017-10-19 ENCOUNTER — Telehealth (HOSPITAL_COMMUNITY): Payer: Self-pay | Admitting: Emergency Medicine

## 2017-10-19 ENCOUNTER — Encounter (HOSPITAL_COMMUNITY): Payer: Self-pay | Admitting: Lab

## 2017-10-19 NOTE — Telephone Encounter (Signed)
We can just refer her to Dr. Donne Hazel with [facility].  I will send him an inbasket to make him aware as well.   Thanks! Elzie Rings

## 2017-10-19 NOTE — Telephone Encounter (Signed)
Can you make a referral to [facility] for Dr Donne Hazel?  Breast cancer recurrence in lymph node.  Triple negative.

## 2017-10-19 NOTE — Progress Notes (Unsigned)
Referral sent to Dr Donne Hazel at [place]. Records faxed on 12/26

## 2017-10-24 ENCOUNTER — Ambulatory Visit (HOSPITAL_COMMUNITY): Payer: BLUE CROSS/BLUE SHIELD

## 2017-10-25 HISTORY — PX: BREAST BIOPSY: SHX20

## 2017-10-25 HISTORY — PX: BREAST LUMPECTOMY: SHX2

## 2017-10-26 ENCOUNTER — Other Ambulatory Visit (HOSPITAL_COMMUNITY): Payer: Self-pay | Admitting: Adult Health

## 2017-10-26 DIAGNOSIS — C50919 Malignant neoplasm of unspecified site of unspecified female breast: Secondary | ICD-10-CM

## 2017-10-27 ENCOUNTER — Other Ambulatory Visit (HOSPITAL_COMMUNITY): Payer: Self-pay | Admitting: Emergency Medicine

## 2017-10-27 DIAGNOSIS — C50919 Malignant neoplasm of unspecified site of unspecified female breast: Secondary | ICD-10-CM

## 2017-10-28 ENCOUNTER — Telehealth: Payer: Self-pay | Admitting: Hematology and Oncology

## 2017-10-28 NOTE — Telephone Encounter (Signed)
Spoke with patient regarding appointment D/T/Loc/PH# °

## 2017-10-31 ENCOUNTER — Telehealth: Payer: Self-pay | Admitting: Genetic Counselor

## 2017-10-31 NOTE — Telephone Encounter (Signed)
Spoke with patient regarding Genetics appointment D/T/Loc

## 2017-11-01 ENCOUNTER — Telehealth: Payer: Self-pay | Admitting: Hematology and Oncology

## 2017-11-01 ENCOUNTER — Encounter (HOSPITAL_COMMUNITY): Payer: Self-pay | Admitting: Adult Health

## 2017-11-01 ENCOUNTER — Other Ambulatory Visit: Payer: Self-pay | Admitting: General Surgery

## 2017-11-01 ENCOUNTER — Inpatient Hospital Stay: Payer: BLUE CROSS/BLUE SHIELD | Attending: Hematology and Oncology | Admitting: Hematology and Oncology

## 2017-11-01 VITALS — BP 173/109 | HR 77 | Temp 98.3°F | Resp 16 | Ht 65.0 in | Wt 214.0 lb

## 2017-11-01 DIAGNOSIS — C773 Secondary and unspecified malignant neoplasm of axilla and upper limb lymph nodes: Secondary | ICD-10-CM | POA: Insufficient documentation

## 2017-11-01 DIAGNOSIS — Z9221 Personal history of antineoplastic chemotherapy: Secondary | ICD-10-CM

## 2017-11-01 DIAGNOSIS — I890 Lymphedema, not elsewhere classified: Secondary | ICD-10-CM | POA: Diagnosis not present

## 2017-11-01 DIAGNOSIS — Z5111 Encounter for antineoplastic chemotherapy: Secondary | ICD-10-CM | POA: Insufficient documentation

## 2017-11-01 DIAGNOSIS — Z79899 Other long term (current) drug therapy: Secondary | ICD-10-CM | POA: Insufficient documentation

## 2017-11-01 DIAGNOSIS — Z923 Personal history of irradiation: Secondary | ICD-10-CM | POA: Insufficient documentation

## 2017-11-01 DIAGNOSIS — Z7689 Persons encountering health services in other specified circumstances: Secondary | ICD-10-CM | POA: Insufficient documentation

## 2017-11-01 DIAGNOSIS — I10 Essential (primary) hypertension: Secondary | ICD-10-CM | POA: Diagnosis not present

## 2017-11-01 DIAGNOSIS — Z803 Family history of malignant neoplasm of breast: Secondary | ICD-10-CM

## 2017-11-01 DIAGNOSIS — Z801 Family history of malignant neoplasm of trachea, bronchus and lung: Secondary | ICD-10-CM

## 2017-11-01 DIAGNOSIS — Z171 Estrogen receptor negative status [ER-]: Secondary | ICD-10-CM | POA: Insufficient documentation

## 2017-11-01 DIAGNOSIS — Z853 Personal history of malignant neoplasm of breast: Secondary | ICD-10-CM | POA: Diagnosis not present

## 2017-11-01 DIAGNOSIS — K5900 Constipation, unspecified: Secondary | ICD-10-CM | POA: Insufficient documentation

## 2017-11-01 DIAGNOSIS — Z8781 Personal history of (healed) traumatic fracture: Secondary | ICD-10-CM | POA: Diagnosis not present

## 2017-11-01 DIAGNOSIS — C50412 Malignant neoplasm of upper-outer quadrant of left female breast: Secondary | ICD-10-CM | POA: Diagnosis present

## 2017-11-01 DIAGNOSIS — N6331 Unspecified lump in axillary tail of the right breast: Secondary | ICD-10-CM

## 2017-11-01 MED ORDER — LORAZEPAM 0.5 MG PO TABS: 0.5000 mg | ORAL_TABLET | Freq: Every evening | ORAL | 0 refills | Status: DC | PRN

## 2017-11-01 MED ORDER — PROCHLORPERAZINE MALEATE 10 MG PO TABS: 10.0000 mg | ORAL_TABLET | Freq: Four times a day (QID) | ORAL | 1 refills | Status: DC | PRN

## 2017-11-01 MED ORDER — LIDOCAINE-PRILOCAINE 2.5-2.5 % EX CREA: TOPICAL_CREAM | CUTANEOUS | 3 refills | Status: DC

## 2017-11-01 MED ORDER — ONDANSETRON HCL 8 MG PO TABS: 8.0000 mg | ORAL_TABLET | Freq: Two times a day (BID) | ORAL | 1 refills | Status: DC | PRN

## 2017-11-01 NOTE — Telephone Encounter (Signed)
Gave patient avs and calendar with appts per 1/8 los.  °

## 2017-11-01 NOTE — Assessment & Plan Note (Signed)
06/30/2011:Breast cancer, IDC, Left, Stage III, Triple negative, 7.6 cm breast mass and palpable axillary mass November 2012 to January 2013: Neoadjuvant chemotherapy with dose dense FEC followed by dose dense Taxotere 12/21/2011: Left lumpectomy: High-grade poorly differentiated IDC 1.7 cm with high-grade DCIS, margins negative, 3/7 lymph nodes positive, ER 0%, PR 0%, HER-2 negative ratio 1.33 T1CN1 stage IIb  RELAPSE 09/26/2017: Left breast upper outer quadrant within the lumpectomy bed: Fibrosis no malignancy, right axillary lymph node biopsy metastatic high-grade carcinoma ER 0%, PR 0%, Ki-67 90%, HER-2 negative ratio 1.11 (similar to previous ductal carcinoma)  Pathology review: I discussed with the patient the pathology report and that it is very similar to her prior diagnosis in 2012.  Recommendation: 1. Repeat genetic testing 2. CT scans and bone scans for staging 3.  Neoadjuvant chemotherapy with TC 4.  Axillary lymph node dissection 5.  Adjuvant radiation  Return to clinic to start chemotherapy.

## 2017-11-01 NOTE — Progress Notes (Signed)
START OFF PATHWAY REGIMEN - Breast   [id number]:Gemcitabine + Carboplatin (1000/2) q21 Days:   A cycle is every 21 days:     Gemcitabine      Carboplatin   **Always confirm dose/schedule in your pharmacy ordering system**    Patient Characteristics: Preoperative or Nonsurgical Candidate (Clinical Staging), Neoadjuvant Therapy followed by Surgery, Invasive Disease, Chemotherapy, HER2 Negative/Unknown/Equivocal, ER Negative/Unknown, Platinum Therapy Indicated Therapeutic Status: Preoperative or Nonsurgical Candidate (Clinical Staging) AJCC M Category: cM0 AJCC Grade: G3 Breast Surgical Plan: Neoadjuvant Therapy followed by Surgery ER Status: Negative (-) AJCC 8 Stage Grouping: Unknown HER2 Status: Negative (-) AJCC T Category: cTX AJCC N Category: cN1 PR Status: Negative (-) Type of Therapy: Platinum Therapy Indicated Intent of Therapy: Curative Intent, Discussed with Patient

## 2017-11-01 NOTE — Progress Notes (Signed)
This documentation to serve as summary of Inbasket communication between Dr. Donne Hazel, myself, and Bary Castilla, RN.   Dr. Donne Hazel recommended MRI breasts to r/o malignancy in contralateral breast. MRI breast was ordered and is scheduled for 11/02/17.   Per Dr. Donne Hazel, patient was presented at breast tumor board in [place] recently. It was recommended that she have second opinion with Dr. Lindi Adie at [facility], consider having her treatment at [facility] given that she lives in [place].  Per Bary Castilla, RN-Breast Nurse Navigator at [facility] Dr. Donne Hazel  , the patient has elected to proceed with treatment under the care of Dr. Lindi Adie at [facility].  Joanne Gavel, RN, our nurse navigator here at [facility] made aware of patient's decision. We will cancel all future appointments here at [facility].     Mike Craze, NP [place] [phone]

## 2017-11-01 NOTE — Progress Notes (Signed)
[place] CONSULT NOTE  Patient Care Team: Patient, [name] as PCP - General ([place]) Neldon Mc, MD as Surgeon (General Surgery) Everardo All, MD (Hematology and Oncology)  CHIEF COMPLAINTS/PURPOSE OF CONSULTATION:  Relapse left breast cancer  HISTORY OF PRESENTING ILLNESS:  Jessica Simpson 46 y.o. female is here because of recent diagnosis of left axillary lymph node biopsy positive for breast cancer.  Patient's history is summarized below.  She had original diagnosis September 2012 and underwent neoadjuvant chemotherapy followed by lumpectomy and radiation.  Recently she was diagnosed with a recurrence in the right axilla.  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  This was similar to her prior cancer diagnosis.  She is here today to discuss the treatment plan.  I reviewed her records extensively and collaborated the history with the patient.  SUMMARY OF ONCOLOGIC HISTORY:   Breast cancer, IDC, Left, Stage III, Triple negative   06/30/2011 Initial Diagnosis    Breast cancer, IDC, Left, Stage III, Triple negative, 7.6 cm breast mass and palpable axillary mass      09/06/2011 Miscellaneous    BRCA 1 and 2: Negative      09/14/2011 - 11/23/2011 Neo-Adjuvant Chemotherapy    Dose dense FEC followed by dose dense Taxotere      12/21/2011 Surgery    Left lumpectomy: High-grade poorly differentiated IDC 1.7 cm with high-grade DCIS, margins negative, 3/7 lymph nodes positive, ER 0%, PR 0%, HER-2 negative ratio 1.33 T1CN1 stage IIb      12/31/2011 - 02/15/2012 Radiation Therapy    Radiation at [facility]      09/26/2017 Relapse/Recurrence    Left breast upper outer quadrant within the lumpectomy bed: Fibrosis no malignancy, right axillary lymph node biopsy metastatic high-grade carcinoma ER 0%, PR 0%, Ki-67 90%, HER-2 negative ratio 1.11 (similar to previous ductal  carcinoma)      MEDICAL HISTORY:  Past Medical History:  Diagnosis Date  . BRCA1 negative 10/22/2011  . BRCA2 negative 10/22/2011  . Breast cancer, IDC, Left, Stage III, Triple negative 06/30/2011  . Contraceptive education 01/15/2016  . Fracture    right 4th toe  . History of breast cancer 07/22/2014  . Hypertension   . Lymphedema 06/15/2012  . Lymphedema of arm   . Personal history of chemotherapy   . Personal history of radiation therapy   . Positive fecal occult blood test 07/22/2014  . S/P radiation therapy 2013   50 gray in 25 fractions to the left breast, supraclavicular, and axillary regions. She then received a boost to the left lumpectomy of 10 gray in 5 fractions. This was given at [place].  . Status post chemotherapy Comp. 11/23/11   FEC and Taxotere    SURGICAL HISTORY: Past Surgical History:  Procedure Laterality Date  . 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 BREAST LUMPECTOMY    . BREAST SURGERY    . CESAREAN SECTION    . COLONOSCOPY N/A 08/14/2014   Procedure: COLONOSCOPY;  Surgeon: Rogene Houston, MD;  [facility];  Service: Endoscopy;  Laterality: N/A;  930  . EVACUATION BREAST HEMATOMA  12/21/2011   Procedure: EVACUATION HEMATOMA BREAST;  Surgeon: Stark Klein, MD;  Location: [facility];  Service: General;  Laterality: Left;  . HAND SURGERY     tumor on finger, left hand  .  INCISION AND DRAINAGE ABSCESS ANAL     x3  . left breast biopsy with axillary biopsy     core bx  . PORT-A-CATH REMOVAL  12/21/2011   Procedure: REMOVAL PORT-A-CATH;  Surgeon: Haywood Lasso, MD;  Location: [facility];  Service: General;  Laterality: N/A;  . PORTACATH PLACEMENT  08/02/2011   dr Margot Chimes  . removal breast mass  2004   benign  . TREATMENT FISTULA ANAL    . TUMOR REMOVAL     on left hand  . TUMOR REMOVAL     rt. eye    SOCIAL HISTORY: Social History   Socioeconomic History  .  Marital status: Single    Spouse name: Not on file  . Number of children: Not on file  . Years of education: Not on file  . Highest education level: Not on file  Social Needs  . Financial resource strain: Not on file  . Food insecurity - worry: Not on file  . Food insecurity - inability: Not on file  . Transportation needs - medical: Not on file  . Transportation needs - non-medical: Not on file  Occupational History  . Not on file  Tobacco Use  . Smoking status: Never Smoker  . Smokeless tobacco: Never Used  Substance and Sexual Activity  . Alcohol use: No  . Drug use: No  . Sexual activity: Yes    Birth control/protection: None  Other Topics Concern  . Not on file  Social History Narrative  . Not on file    FAMILY HISTORY: Family History  Problem Relation Age of Onset  . Cancer Father 23       lung cancer   . Cancer Maternal Grandmother        breast  . Cancer Maternal Grandfather   . Colon cancer Neg Hx     ALLERGIES:  is allergic to codeine.  MEDICATIONS:  Current Outpatient Medications  Medication Sig Dispense Refill  . acetaminophen (TYLENOL) 500 MG tablet Take 500-1,000 mg by mouth every 6 (six) hours as needed. For pain    . amLODipine (NORVASC) 5 MG tablet Take 5 mg by mouth daily.  6  . Cholecalciferol 5000 units capsule Take 1 capsule (5,000 Units total) by mouth daily. (Patient taking differently: Take 2,000 Units by mouth daily. )    . loratadine (CLARITIN) 10 MG tablet PRN  1   No current facility-administered medications for this visit.    Facility-Administered Medications Ordered in Other Visits  Medication Dose Route Frequency Provider Last Rate Last Dose  . lactated ringers infusion    Continuous PRN Kerby Less, CRNA        REVIEW OF SYSTEMS:   Constitutional: Denies fevers, chills or abnormal night sweats Eyes: Denies blurriness of vision, double vision or watery eyes Ears, nose, mouth, throat, and face: Denies mucositis or sore  throat Respiratory: Denies cough, dyspnea or wheezes Cardiovascular: Denies palpitation, chest discomfort or lower extremity swelling Gastrointestinal:  Denies nausea, heartburn or change in bowel habits Skin: Denies abnormal skin rashes Lymphatics: Denies new lymphadenopathy or easy bruising Neurological:Denies numbness, tingling or new weaknesses Behavioral/Psych: Mood is stable, no new changes  Breast: Axillary recurrence All other systems were reviewed with the patient and are negative.  PHYSICAL EXAMINATION: ECOG PERFORMANCE STATUS: 1 - Symptomatic but completely ambulatory  Vitals:   11/01/17 0929  BP: (!) 173/109  Pulse: 77  Resp: 16  Temp: 98.3 F (36.8 C)  SpO2: 100%   Filed  Weights   11/01/17 0929  Weight: 214 lb (97.1 kg)    GENERAL:alert, no distress and comfortable SKIN: skin color, texture, turgor are normal, no rashes or significant lesions EYES: normal, conjunctiva are pink and non-injected, sclera clear OROPHARYNX:no exudate, no erythema and lips, buccal mucosa, and tongue normal  NECK: supple, thyroid normal size, non-tender, without nodularity LYMPH:  no palpable lymphadenopathy in the cervical, axillary or inguinal LUNGS: clear to auscultation and percussion with normal breathing effort HEART: regular rate & rhythm and no murmurs and no lower extremity edema ABDOMEN:abdomen soft, non-tender and normal bowel sounds Musculoskeletal:no cyanosis of digits and no clubbing  PSYCH: alert & oriented x 3 with fluent speech NEURO: no focal motor/sensory deficits BREAST: No palpable nodules in breast.  Palpable axillary lymph node (exam performed in the presence of a chaperone)   LABORATORY DATA:  I have reviewed the data as listed Lab Results  Component Value Date   WBC 6.4 10/14/2017   HGB 12.9 10/14/2017   HCT 40.4 10/14/2017   MCV 81.8 10/14/2017   PLT 330 10/14/2017   Lab Results  Component Value Date   NA 137 10/14/2017   K 3.3 (L) 10/14/2017    CL 102 10/14/2017   CO2 24 10/14/2017    RADIOGRAPHIC STUDIES: I have personally reviewed the radiological reports and agreed with the findings in the report.  ASSESSMENT AND PLAN:  Breast cancer, IDC, Left, Stage III, Triple negative 06/30/2011:Breast cancer, IDC, Left, Stage III, Triple negative, 7.6 cm breast mass and palpable axillary mass November 2012 to January 2013: Neoadjuvant chemotherapy with dose dense FEC followed by dose dense Taxotere 12/21/2011: Left lumpectomy: High-grade poorly differentiated IDC 1.7 cm with high-grade DCIS, margins negative, 3/7 lymph nodes positive, ER 0%, PR 0%, HER-2 negative ratio 1.33 T1CN1 stage IIb  RELAPSE 09/26/2017: Left breast upper outer quadrant within the lumpectomy bed: Fibrosis no malignancy, right axillary lymph node biopsy metastatic high-grade carcinoma ER 0%, PR 0%, Ki-67 90%, HER-2 negative ratio 1.11 (similar to previous ductal carcinoma)  Pathology review: I discussed with the patient the pathology report and that it is very similar to her prior diagnosis in 2012. PET/CT scan: Right axillary lymph node, physiological activity in the tonsils and a level 2 cervical node, activity in the right ovary versus ureter needs GYN evaluation  Recommendation: 1.  Breast MRI to be done tomorrow 2. Repeat genetic testing 3. Neoadjuvant chemotherapy with carboplatin and gemcitabine 4.  Surgery with axillary lymph node dissection 5. Adjuvant radiation  Genetic testing comes back positive, she may elect to do bilateral mastectomies. Return to clinic to start chemotherapy next Friday.  I will request Dr. Donne Hazel to place the port next Thursday. Patient has an exam next week and would like to finish the test before she begins chemo.   All questions were answered. The patient knows to call the clinic with any problems, questions or concerns.    Harriette Ohara, MD 11/01/17

## 2017-11-02 ENCOUNTER — Telehealth: Payer: Self-pay | Admitting: Hematology and Oncology

## 2017-11-02 ENCOUNTER — Telehealth (HOSPITAL_COMMUNITY): Payer: Self-pay | Admitting: Emergency Medicine

## 2017-11-02 ENCOUNTER — Ambulatory Visit
Admission: RE | Admit: 2017-11-02 | Discharge: 2017-11-02 | Disposition: A | Discharge status: Home / Self Care | Payer: BLUE CROSS/BLUE SHIELD | Source: Ambulatory Visit | Attending: Adult Health | Admitting: Adult Health

## 2017-11-02 DIAGNOSIS — C50919 Malignant neoplasm of unspecified site of unspecified female breast: Secondary | ICD-10-CM

## 2017-11-02 MED ORDER — GADOBENATE DIMEGLUMINE 529 MG/ML IV SOLN
20.0000 mL | Freq: Once | INTRAVENOUS | Status: AC | PRN
  Administered 2017-11-02: 20 mL via INTRAVENOUS

## 2017-11-02 NOTE — Telephone Encounter (Signed)
Pt called and wanted to know what 4 prescriptions Dr Talbert Cage called in for her.  RN let her know that Dr Talbert Cage was no longer her and that she had not called in any prescriptions for the patient.

## 2017-11-02 NOTE — Telephone Encounter (Signed)
Spoke to patient regarding upcoming January through March appointments. °

## 2017-11-07 ENCOUNTER — Encounter: Payer: Self-pay | Admitting: Hematology and Oncology

## 2017-11-07 ENCOUNTER — Ambulatory Visit (HOSPITAL_COMMUNITY): Payer: BLUE CROSS/BLUE SHIELD | Admitting: Hematology and Oncology

## 2017-11-07 NOTE — Progress Notes (Signed)
The Patient [place] has funding available for pt's Dx and she gave me consent to apply in her behalf so I completed the application over the phone.  She was approved for $4,000 for 12 months from 11/07/17 with a 6 month look back period.  $2,500 is her standard award, $1,500 is accessible on a [employer] basis as long as funding remains available.

## 2017-11-08 NOTE — Pre-Procedure Instructions (Addendum)
Jessica Simpson  11/08/2017      [facility] - [place], [place] [address] Phone: [phone] Fax: [phone]  [id number] [name], [name] [address] Phone: [phone] Fax: [phone]    Your procedure is scheduled on 11/14/2017.  Report to [facility] Admitting at [facility]  Call this number if you have problems the morning of surgery:  [phone]   Remember:  Do not eat food or drink liquids after midnight except complete your Ensure Pre-surgery Drink prior to leaving home the morning of your surgery.   Continue all medications as directed by your physician except follow these medication instructions before surgery below   Take these medicines the morning of surgery with A SIP OF WATER: Acetaminophen (Tylenol) - if needed for pain Amlodipine (Norvasc) Loratadine (Claritin) - if needed  7 days prior to surgery STOP taking any Aspirin(unless otherwise instructed by your surgeon), Aleve, Naproxen, Ibuprofen, Motrin, Advil, Goody's, BC's, all herbal medications, fish oil, and all vitamins    Do not wear jewelry, make-up or nail polish.  Do not wear lotions, powders, or perfumes, or deodorant.  Do not shave 48 hours prior to surgery.    Do not bring valuables to the hospital.  [facility] is not responsible for any belongings or valuables.  Hearing aids, eyeglasses, contacts, dentures or bridgework may not be worn into surgery.  Leave your suitcase in the car.  After surgery it may be brought to your room.  For patients admitted to the hospital, discharge time will be determined by your treatment team.  Patients discharged the day of surgery will not be allowed to drive home.   Name and phone number of your driver:    Special instructions:   [facility]- Preparing For Surgery  Before surgery, you can  play an important role. Because skin is not sterile, your skin needs to be as free of germs as possible. You can reduce the number of germs on your skin by washing with CHG (chlorahexidine gluconate) Soap before surgery.  CHG is an antiseptic cleaner which kills germs and bonds with the skin to continue killing germs even after washing.  Please do not use if you have an allergy to CHG or antibacterial soaps. If your skin becomes reddened/irritated stop using the CHG.  Do not shave (including legs and underarms) for at least 48 hours prior to first CHG shower. It is OK to shave your face.  Please follow these instructions carefully.   1. Shower the NIGHT BEFORE SURGERY and the MORNING OF SURGERY with CHG.   2. If you chose to wash your hair, wash your hair first as usual with your normal shampoo.  3. After you shampoo, rinse your hair and body thoroughly to remove the shampoo.  4. Use CHG as you would any other liquid soap. You can apply CHG directly to the skin and wash gently with a scrungie or a clean washcloth.   5. Apply the CHG Soap to your body ONLY FROM THE NECK DOWN.  Do not use on open wounds or open sores. Avoid contact with your eyes, ears, mouth and genitals (private parts). Wash Face and genitals (private parts)  with your normal soap.  6. Wash thoroughly, paying special attention to the area where your surgery will be performed.  7. Thoroughly  rinse your body with warm water from the neck down.  8. DO NOT shower/wash with your normal soap after using and rinsing off the CHG Soap.  9. Pat yourself dry with a CLEAN TOWEL.  10. Wear CLEAN PAJAMAS to bed the night before surgery, wear comfortable clothes the morning of surgery  11. Place CLEAN SHEETS on your bed the night of your first shower and DO NOT SLEEP WITH PETS.    Day of Surgery: Shower as stated above. Do not apply any deodorants/lotions. Please wear clean clothes to the hospital/surgery center.      Please  read over the following fact sheets that you were given.

## 2017-11-09 ENCOUNTER — Encounter (HOSPITAL_COMMUNITY): Payer: Self-pay

## 2017-11-09 ENCOUNTER — Other Ambulatory Visit: Payer: Self-pay

## 2017-11-09 ENCOUNTER — Encounter (HOSPITAL_COMMUNITY)
Admission: RE | Admit: 2017-11-09 | Discharge: 2017-11-09 | Disposition: A | Discharge status: Home / Self Care | Payer: BLUE CROSS/BLUE SHIELD | Source: Ambulatory Visit | Attending: General Surgery | Admitting: General Surgery

## 2017-11-09 DIAGNOSIS — I10 Essential (primary) hypertension: Secondary | ICD-10-CM | POA: Diagnosis not present

## 2017-11-09 DIAGNOSIS — Z01810 Encounter for preprocedural cardiovascular examination: Secondary | ICD-10-CM | POA: Diagnosis present

## 2017-11-09 DIAGNOSIS — Z01818 Encounter for other preprocedural examination: Secondary | ICD-10-CM | POA: Insufficient documentation

## 2017-11-09 LAB — CBC
HCT: 38.9 % (ref 36.0–46.0)
HEMOGLOBIN: 12.6 g/dL (ref 12.0–15.0)
MCH: 26.4 pg (ref 26.0–34.0)
MCHC: 32.4 g/dL (ref 30.0–36.0)
MCV: 81.4 fL (ref 78.0–100.0)
PLATELETS: 287 10*3/uL (ref 150–400)
RBC: 4.78 MIL/uL (ref 3.87–5.11)
RDW: 13.9 % (ref 11.5–15.5)
WBC: 6.4 10*3/uL (ref 4.0–10.5)

## 2017-11-09 LAB — BASIC METABOLIC PANEL
ANION GAP: 9 (ref 5–15)
BUN: 10 mg/dL (ref 6–20)
CHLORIDE: 106 mmol/L (ref 101–111)
CO2: 23 mmol/L (ref 22–32)
CREATININE: 0.92 mg/dL (ref 0.44–1.00)
Calcium: 9.4 mg/dL (ref 8.9–10.3)
GFR calc non Af Amer: >60 mL/min (ref >60)
Glucose, Bld: 120 mg/dL — ABNORMAL HIGH (ref 65–99)
POTASSIUM: 3.5 mmol/L (ref 3.5–5.1)
SODIUM: 138 mmol/L (ref 135–145)

## 2017-11-09 LAB — HCG, SERUM, QUALITATIVE: PREG SERUM: NEGATIVE

## 2017-11-10 ENCOUNTER — Encounter: Payer: Self-pay | Admitting: Pharmacist

## 2017-11-11 ENCOUNTER — Ambulatory Visit: Payer: BLUE CROSS/BLUE SHIELD | Admitting: Hematology and Oncology

## 2017-11-11 ENCOUNTER — Other Ambulatory Visit: Payer: BLUE CROSS/BLUE SHIELD

## 2017-11-11 ENCOUNTER — Inpatient Hospital Stay: Payer: BLUE CROSS/BLUE SHIELD

## 2017-11-13 NOTE — Anesthesia Preprocedure Evaluation (Addendum)
Anesthesia Evaluation  Patient identified by MRN, date of birth, ID band Patient awake    Reviewed: Allergy & Precautions, H&P , NPO status , Patient's Chart, lab work & pertinent test results  Airway Mallampati: III  TM Distance: >3 FB Neck ROM: Full    Dental no notable dental hx. (+) Teeth Intact, Dental Advisory Given   Pulmonary neg pulmonary ROS,    Pulmonary exam normal breath sounds clear to auscultation       Cardiovascular Exercise Tolerance: Good hypertension, Pt. on medications  Rhythm:Regular Rate:Normal     Neuro/Psych negative neurological ROS  negative psych ROS   GI/Hepatic negative GI ROS, Neg liver ROS,   Endo/Other  Morbid obesity  Renal/GU negative Renal ROS  negative genitourinary   Musculoskeletal   Abdominal   Peds  Hematology negative hematology ROS (+)   Anesthesia Other Findings   Reproductive/Obstetrics negative OB ROS                            Anesthesia Physical Anesthesia Plan  ASA: III  Anesthesia Plan: General   Post-op Pain Management:    Induction: Intravenous  PONV Risk Score and Plan: 4 or greater and Dexamethasone, Ondansetron and Midazolam  Airway Management Planned: LMA  Additional Equipment:   Intra-op Plan:   Post-operative Plan: Extubation in OR  Informed Consent: I have reviewed the patients History and Physical, chart, labs and discussed the procedure including the risks, benefits and alternatives for the proposed anesthesia with the patient or authorized representative who has indicated his/her understanding and acceptance.   Dental advisory given  Plan Discussed with: CRNA  Anesthesia Plan Comments:         Anesthesia Quick Evaluation

## 2017-11-14 ENCOUNTER — Encounter: Payer: Self-pay | Admitting: *Deleted

## 2017-11-14 ENCOUNTER — Encounter (HOSPITAL_COMMUNITY)
Admission: RE | Disposition: A | Discharge status: Home / Self Care | Payer: Self-pay | Source: Ambulatory Visit | Attending: General Surgery

## 2017-11-14 ENCOUNTER — Ambulatory Visit (HOSPITAL_COMMUNITY): Payer: BLUE CROSS/BLUE SHIELD

## 2017-11-14 ENCOUNTER — Inpatient Hospital Stay: Payer: BLUE CROSS/BLUE SHIELD

## 2017-11-14 ENCOUNTER — Inpatient Hospital Stay (HOSPITAL_BASED_OUTPATIENT_CLINIC_OR_DEPARTMENT_OTHER): Payer: BLUE CROSS/BLUE SHIELD | Admitting: Hematology and Oncology

## 2017-11-14 ENCOUNTER — Ambulatory Visit (HOSPITAL_COMMUNITY)
Admission: RE | Admit: 2017-11-14 | Discharge: 2017-11-14 | Disposition: A | Discharge status: Home / Self Care | Payer: BLUE CROSS/BLUE SHIELD | Source: Ambulatory Visit | Attending: General Surgery | Admitting: General Surgery

## 2017-11-14 ENCOUNTER — Ambulatory Visit (HOSPITAL_COMMUNITY): Payer: BLUE CROSS/BLUE SHIELD | Admitting: Certified Registered"

## 2017-11-14 ENCOUNTER — Ambulatory Visit (HOSPITAL_COMMUNITY): Payer: BLUE CROSS/BLUE SHIELD | Admitting: Emergency Medicine

## 2017-11-14 ENCOUNTER — Encounter (HOSPITAL_COMMUNITY): Payer: Self-pay | Admitting: Anesthesiology

## 2017-11-14 DIAGNOSIS — Z95828 Presence of other vascular implants and grafts: Secondary | ICD-10-CM

## 2017-11-14 DIAGNOSIS — Z801 Family history of malignant neoplasm of trachea, bronchus and lung: Secondary | ICD-10-CM

## 2017-11-14 DIAGNOSIS — N6331 Unspecified lump in axillary tail of the right breast: Secondary | ICD-10-CM

## 2017-11-14 DIAGNOSIS — Z5111 Encounter for antineoplastic chemotherapy: Secondary | ICD-10-CM | POA: Diagnosis not present

## 2017-11-14 DIAGNOSIS — Z171 Estrogen receptor negative status [ER-]: Secondary | ICD-10-CM | POA: Diagnosis not present

## 2017-11-14 DIAGNOSIS — Z79899 Other long term (current) drug therapy: Secondary | ICD-10-CM | POA: Diagnosis not present

## 2017-11-14 DIAGNOSIS — C773 Secondary and unspecified malignant neoplasm of axilla and upper limb lymph nodes: Secondary | ICD-10-CM | POA: Diagnosis not present

## 2017-11-14 DIAGNOSIS — Z9221 Personal history of antineoplastic chemotherapy: Secondary | ICD-10-CM

## 2017-11-14 DIAGNOSIS — C50412 Malignant neoplasm of upper-outer quadrant of left female breast: Secondary | ICD-10-CM

## 2017-11-14 DIAGNOSIS — I10 Essential (primary) hypertension: Secondary | ICD-10-CM

## 2017-11-14 DIAGNOSIS — Z885 Allergy status to narcotic agent status: Secondary | ICD-10-CM | POA: Insufficient documentation

## 2017-11-14 DIAGNOSIS — C969 Malignant neoplasm of lymphoid, hematopoietic and related tissue, unspecified: Secondary | ICD-10-CM | POA: Diagnosis present

## 2017-11-14 DIAGNOSIS — Z419 Encounter for procedure for purposes other than remedying health state, unspecified: Secondary | ICD-10-CM

## 2017-11-14 DIAGNOSIS — Z853 Personal history of malignant neoplasm of breast: Secondary | ICD-10-CM | POA: Insufficient documentation

## 2017-11-14 DIAGNOSIS — Z803 Family history of malignant neoplasm of breast: Secondary | ICD-10-CM

## 2017-11-14 DIAGNOSIS — Z923 Personal history of irradiation: Secondary | ICD-10-CM

## 2017-11-14 DIAGNOSIS — Z8781 Personal history of (healed) traumatic fracture: Secondary | ICD-10-CM

## 2017-11-14 DIAGNOSIS — I890 Lymphedema, not elsewhere classified: Secondary | ICD-10-CM

## 2017-11-14 HISTORY — PX: PORTACATH PLACEMENT: SHX2246

## 2017-11-14 SURGERY — INSERTION, TUNNELED CENTRAL VENOUS DEVICE, WITH PORT
Anesthesia: General | Site: Chest | Laterality: Right

## 2017-11-14 MED ORDER — BUPIVACAINE HCL (PF) 0.25 % IJ SOLN
INTRAMUSCULAR | Status: AC
  Filled 2017-11-14: qty 30

## 2017-11-14 MED ORDER — HEPARIN SOD (PORK) LOCK FLUSH 100 UNIT/ML IV SOLN
INTRAVENOUS | Status: DC | PRN
  Administered 2017-11-14: 500 [IU] via INTRAVENOUS

## 2017-11-14 MED ORDER — SODIUM CHLORIDE 0.9% FLUSH
10.0000 mL | INTRAVENOUS | Status: DC | PRN
  Administered 2017-11-14: 10 mL
  Filled 2017-11-14: qty 10

## 2017-11-14 MED ORDER — FENTANYL CITRATE (PF) 100 MCG/2ML IJ SOLN
INTRAMUSCULAR | Status: DC | PRN
  Administered 2017-11-14: 50 ug via INTRAVENOUS
  Administered 2017-11-14: 25 ug via INTRAVENOUS
  Administered 2017-11-14: 50 ug via INTRAVENOUS
  Administered 2017-11-14: 75 ug via INTRAVENOUS

## 2017-11-14 MED ORDER — ACETAMINOPHEN 500 MG PO TABS
1000.0000 mg | ORAL_TABLET | ORAL | Status: AC
  Administered 2017-11-14: 1000 mg via ORAL
  Filled 2017-11-14: qty 2

## 2017-11-14 MED ORDER — PHENYLEPHRINE 40 MCG/ML (10ML) SYRINGE FOR IV PUSH (FOR BLOOD PRESSURE SUPPORT)
PREFILLED_SYRINGE | INTRAVENOUS | Status: DC | PRN
Start: 2017-11-14 — End: 2017-11-14
  Administered 2017-11-14: 80 ug via INTRAVENOUS

## 2017-11-14 MED ORDER — TRAMADOL HCL 50 MG PO TABS
100.0000 mg | ORAL_TABLET | Freq: Two times a day (BID) | ORAL | Status: DC | PRN
  Administered 2017-11-14: 100 mg via ORAL

## 2017-11-14 MED ORDER — PROPOFOL 10 MG/ML IV BOLUS
INTRAVENOUS | Status: AC
  Filled 2017-11-14: qty 40

## 2017-11-14 MED ORDER — EPHEDRINE SULFATE 50 MG/ML IJ SOLN
INTRAMUSCULAR | Status: DC | PRN
  Administered 2017-11-14 (×2): 10 mg via INTRAVENOUS

## 2017-11-14 MED ORDER — TRAMADOL HCL 50 MG PO TABS
ORAL_TABLET | ORAL | Status: AC
  Filled 2017-11-14: qty 2

## 2017-11-14 MED ORDER — SODIUM CHLORIDE 0.9 % IV SOLN
1000.0000 mg/m2 | Freq: Once | INTRAVENOUS | Status: AC
  Administered 2017-11-14: 2128 mg via INTRAVENOUS
  Filled 2017-11-14: qty 55.97

## 2017-11-14 MED ORDER — SODIUM CHLORIDE 0.9 % IV SOLN
280.0000 mg | Freq: Once | INTRAVENOUS | Status: AC
  Administered 2017-11-14: 280 mg via INTRAVENOUS
  Filled 2017-11-14: qty 28

## 2017-11-14 MED ORDER — LIDOCAINE 2% (20 MG/ML) 5 ML SYRINGE
INTRAMUSCULAR | Status: DC | PRN
  Administered 2017-11-14: 60 mg via INTRAVENOUS

## 2017-11-14 MED ORDER — DEXAMETHASONE SODIUM PHOSPHATE 10 MG/ML IJ SOLN
10.0000 mg | Freq: Once | INTRAMUSCULAR | Status: AC
  Administered 2017-11-14: 10 mg via INTRAVENOUS

## 2017-11-14 MED ORDER — SODIUM CHLORIDE 0.9 % IV SOLN
INTRAVENOUS | Status: DC | PRN
  Administered 2017-11-14: 07:00:00

## 2017-11-14 MED ORDER — SODIUM CHLORIDE 0.9% FLUSH: 3.0000 mL | INTRAVENOUS | Status: DC | PRN

## 2017-11-14 MED ORDER — SODIUM CHLORIDE 0.9 % IV SOLN: INTRAVENOUS | Status: DC

## 2017-11-14 MED ORDER — PROPOFOL 10 MG/ML IV BOLUS
INTRAVENOUS | Status: DC | PRN
  Administered 2017-11-14: 180 mg via INTRAVENOUS

## 2017-11-14 MED ORDER — ACETAMINOPHEN 325 MG PO TABS: 650.0000 mg | ORAL_TABLET | ORAL | Status: DC | PRN

## 2017-11-14 MED ORDER — PALONOSETRON HCL INJECTION 0.25 MG/5ML
0.2500 mg | Freq: Once | INTRAVENOUS | Status: AC
  Administered 2017-11-14: 0.25 mg via INTRAVENOUS

## 2017-11-14 MED ORDER — HEPARIN SOD (PORK) LOCK FLUSH 100 UNIT/ML IV SOLN
500.0000 [IU] | Freq: Once | INTRAVENOUS | Status: AC | PRN
  Administered 2017-11-14: 500 [IU]
  Filled 2017-11-14: qty 5

## 2017-11-14 MED ORDER — ONDANSETRON HCL 4 MG/2ML IJ SOLN
INTRAMUSCULAR | Status: DC | PRN
  Administered 2017-11-14: 4 mg via INTRAVENOUS

## 2017-11-14 MED ORDER — MIDAZOLAM HCL 2 MG/2ML IJ SOLN
INTRAMUSCULAR | Status: AC
  Filled 2017-11-14: qty 2

## 2017-11-14 MED ORDER — SODIUM CHLORIDE 0.9% FLUSH: 3.0000 mL | Freq: Two times a day (BID) | INTRAVENOUS | Status: DC

## 2017-11-14 MED ORDER — FENTANYL CITRATE (PF) 100 MCG/2ML IJ SOLN
INTRAMUSCULAR | Status: AC
  Filled 2017-11-14: qty 2

## 2017-11-14 MED ORDER — BUPIVACAINE HCL (PF) 0.25 % IJ SOLN
INTRAMUSCULAR | Status: DC | PRN
  Administered 2017-11-14: 30 mL

## 2017-11-14 MED ORDER — MIDAZOLAM HCL 5 MG/5ML IJ SOLN
INTRAMUSCULAR | Status: DC | PRN
  Administered 2017-11-14: 2 mg via INTRAVENOUS

## 2017-11-14 MED ORDER — DEXAMETHASONE SODIUM PHOSPHATE 10 MG/ML IJ SOLN
INTRAMUSCULAR | Status: AC
  Filled 2017-11-14: qty 1

## 2017-11-14 MED ORDER — GABAPENTIN 300 MG PO CAPS
300.0000 mg | ORAL_CAPSULE | ORAL | Status: AC
  Administered 2017-11-14: 300 mg via ORAL
  Filled 2017-11-14: qty 1

## 2017-11-14 MED ORDER — HEPARIN SOD (PORK) LOCK FLUSH 100 UNIT/ML IV SOLN
INTRAVENOUS | Status: AC
  Filled 2017-11-14: qty 5

## 2017-11-14 MED ORDER — ACETAMINOPHEN 650 MG RE SUPP: 650.0000 mg | RECTAL | Status: DC | PRN

## 2017-11-14 MED ORDER — ONDANSETRON HCL 4 MG/2ML IJ SOLN
INTRAMUSCULAR | Status: AC
  Filled 2017-11-14: qty 2

## 2017-11-14 MED ORDER — DEXAMETHASONE SODIUM PHOSPHATE 4 MG/ML IJ SOLN
INTRAMUSCULAR | Status: DC | PRN
  Administered 2017-11-14: 10 mg via INTRAVENOUS

## 2017-11-14 MED ORDER — FENTANYL CITRATE (PF) 250 MCG/5ML IJ SOLN
INTRAMUSCULAR | Status: AC
  Filled 2017-11-14: qty 5

## 2017-11-14 MED ORDER — TRAMADOL HCL 50 MG PO TABS: 100.0000 mg | ORAL_TABLET | Freq: Four times a day (QID) | ORAL | 0 refills | Status: DC | PRN

## 2017-11-14 MED ORDER — SODIUM CHLORIDE 0.9 % IV SOLN
Freq: Once | INTRAVENOUS | Status: AC
  Administered 2017-11-14: 14:00:00 via INTRAVENOUS

## 2017-11-14 MED ORDER — 0.9 % SODIUM CHLORIDE (POUR BTL) OPTIME
TOPICAL | Status: DC | PRN
  Administered 2017-11-14: 1000 mL

## 2017-11-14 MED ORDER — MORPHINE SULFATE (PF) 2 MG/ML IV SOLN: 2.0000 mg | INTRAVENOUS | Status: DC | PRN

## 2017-11-14 MED ORDER — SODIUM CHLORIDE 0.9 % IV SOLN: 250.0000 mL | INTRAVENOUS | Status: DC | PRN

## 2017-11-14 MED ORDER — CEFAZOLIN SODIUM-DEXTROSE 2-4 GM/100ML-% IV SOLN
2.0000 g | INTRAVENOUS | Status: AC
  Administered 2017-11-14: 2 g via INTRAVENOUS
  Filled 2017-11-14: qty 100

## 2017-11-14 MED ORDER — LIDOCAINE 2% (20 MG/ML) 5 ML SYRINGE
INTRAMUSCULAR | Status: AC
  Filled 2017-11-14: qty 5

## 2017-11-14 MED ORDER — FENTANYL CITRATE (PF) 100 MCG/2ML IJ SOLN
25.0000 ug | INTRAMUSCULAR | Status: DC | PRN
  Administered 2017-11-14: 50 ug via INTRAVENOUS

## 2017-11-14 SURGICAL SUPPLY — 53 items
BAG DECANTER FOR FLEXI CONT (MISCELLANEOUS) ×2 IMPLANT
BLADE SURG 11 STRL SS (BLADE) ×2 IMPLANT
BLADE SURG 15 STRL LF DISP TIS (BLADE) ×1 IMPLANT
BLADE SURG 15 STRL SS (BLADE) ×1
CHLORAPREP W/TINT 26ML (MISCELLANEOUS) ×2 IMPLANT
COVER SURGICAL LIGHT HANDLE (MISCELLANEOUS) ×2 IMPLANT
COVER TRANSDUCER ULTRASND GEL (DRAPE) ×2 IMPLANT
CRADLE DONUT ADULT HEAD (MISCELLANEOUS) ×2 IMPLANT
DECANTER SPIKE VIAL GLASS SM (MISCELLANEOUS) ×2 IMPLANT
DERMABOND ADVANCED (GAUZE/BANDAGES/DRESSINGS) ×1
DERMABOND ADVANCED .7 DNX12 (GAUZE/BANDAGES/DRESSINGS) ×1 IMPLANT
DRAPE C-ARM 42X72 X-RAY (DRAPES) ×2 IMPLANT
DRAPE CHEST BREAST 15X10 FENES (DRAPES) ×2 IMPLANT
DRAPE UTILITY XL STRL (DRAPES) ×2 IMPLANT
DRSG TEGADERM 4X4.75 (GAUZE/BANDAGES/DRESSINGS) ×4 IMPLANT
ELECT CAUTERY BLADE 6.4 (BLADE) ×2 IMPLANT
ELECT REM PT RETURN 9FT ADLT (ELECTROSURGICAL) ×2
ELECTRODE REM PT RTRN 9FT ADLT (ELECTROSURGICAL) ×1 IMPLANT
GAUZE SPONGE 4X4 12PLY STRL LF (GAUZE/BANDAGES/DRESSINGS) ×2 IMPLANT
GAUZE SPONGE 4X4 16PLY XRAY LF (GAUZE/BANDAGES/DRESSINGS) ×2 IMPLANT
GEL ULTRASOUND 20GR AQUASONIC (MISCELLANEOUS) ×2 IMPLANT
GLOVE BIO SURGEON STRL SZ7 (GLOVE) ×2 IMPLANT
GLOVE BIOGEL PI IND STRL 6 (GLOVE) ×1 IMPLANT
GLOVE BIOGEL PI IND STRL 7.5 (GLOVE) ×1 IMPLANT
GLOVE BIOGEL PI INDICATOR 6 (GLOVE) ×1
GLOVE BIOGEL PI INDICATOR 7.5 (GLOVE) ×1
GLOVE SURG SS PI 6.5 STRL IVOR (GLOVE) ×2 IMPLANT
GOWN STRL REUS W/ TWL LRG LVL3 (GOWN DISPOSABLE) ×2 IMPLANT
GOWN STRL REUS W/TWL LRG LVL3 (GOWN DISPOSABLE) ×2
INTRODUCER COOK 11FR (CATHETERS) IMPLANT
KIT BASIN OR (CUSTOM PROCEDURE TRAY) ×2 IMPLANT
KIT PORT POWER 8FR ISP CVUE (Miscellaneous) ×2 IMPLANT
KIT ROOM TURNOVER OR (KITS) ×2 IMPLANT
NEEDLE HYPO 25GX1X1/2 BEV (NEEDLE) ×2 IMPLANT
NS IRRIG 1000ML POUR BTL (IV SOLUTION) ×2 IMPLANT
PACK SURGICAL SETUP 50X90 (CUSTOM PROCEDURE TRAY) ×2 IMPLANT
PAD ARMBOARD 7.5X6 YLW CONV (MISCELLANEOUS) ×2 IMPLANT
PENCIL BUTTON HOLSTER BLD 10FT (ELECTRODE) ×2 IMPLANT
SET INTRODUCER 12FR PACEMAKER (SHEATH) IMPLANT
SET SHEATH INTRODUCER 10FR (MISCELLANEOUS) IMPLANT
SHEATH COOK PEEL AWAY SET 9F (SHEATH) IMPLANT
SUT MNCRL AB 4-0 PS2 18 (SUTURE) ×2 IMPLANT
SUT PROLENE 2 0 SH DA (SUTURE) ×2 IMPLANT
SUT SILK 2 0 (SUTURE)
SUT SILK 2-0 18XBRD TIE 12 (SUTURE) IMPLANT
SUT VIC AB 3-0 SH 27 (SUTURE) ×1
SUT VIC AB 3-0 SH 27XBRD (SUTURE) ×1 IMPLANT
SYR 20ML ECCENTRIC (SYRINGE) ×4 IMPLANT
SYR 5ML LUER SLIP (SYRINGE) ×2 IMPLANT
SYR CONTROL 10ML LL (SYRINGE) IMPLANT
TOWEL OR 17X24 6PK STRL BLUE (TOWEL DISPOSABLE) ×2 IMPLANT
TOWEL OR 17X26 10 PK STRL BLUE (TOWEL DISPOSABLE) IMPLANT
TUBE CONNECTING 12X1/4 (SUCTIONS) ×2 IMPLANT

## 2017-11-14 NOTE — Anesthesia Procedure Notes (Signed)
Procedure Name: LMA Insertion Date/Time: 11/14/2017 7:31 AM Performed by: Lieutenant Diego, CRNA Pre-anesthesia Checklist: Patient identified, Emergency Drugs available, Suction available and Patient being monitored Patient Re-evaluated:Patient Re-evaluated prior to induction Oxygen Delivery Method: Circle system utilized Preoxygenation: Pre-oxygenation with 100% oxygen Induction Type: IV induction Ventilation: Mask ventilation without difficulty LMA: LMA inserted LMA Size: 4.0 Number of attempts: 1 Airway Equipment and Method: Bite block Placement Confirmation: positive ETCO2 and breath sounds checked- equal and bilateral Tube secured with: Tape Dental Injury: Teeth and Oropharynx as per pre-operative assessment

## 2017-11-14 NOTE — Patient Instructions (Signed)
[facility] Discharge Instructions for Patients Receiving Chemotherapy  Today you received the following chemotherapy agents Gemcitabine, Carboplatin  To help prevent nausea and vomiting after your treatment, we encourage you to take your nausea medication as directed   If you develop nausea and vomiting that is not controlled by your nausea medication, call the clinic.   BELOW ARE SYMPTOMS THAT SHOULD BE REPORTED IMMEDIATELY:  *FEVER GREATER THAN 100.5 F  *CHILLS WITH OR WITHOUT FEVER  NAUSEA AND VOMITING THAT IS NOT CONTROLLED WITH YOUR NAUSEA MEDICATION  *UNUSUAL SHORTNESS OF BREATH  *UNUSUAL BRUISING OR BLEEDING  TENDERNESS IN MOUTH AND THROAT WITH OR WITHOUT PRESENCE OF ULCERS  *URINARY PROBLEMS  *BOWEL PROBLEMS  UNUSUAL RASH Items with * indicate a potential emergency and should be followed up as soon as possible.  Feel free to call the clinic should you have any questions or concerns. The clinic phone number is [phone].  Please show the [facility] at check-in to the Emergency Department and triage nurse.

## 2017-11-14 NOTE — Progress Notes (Signed)
Ok to proceed with treatment with 11/09/17 BMP, CBC per Dr. Lindi Adie.  Discharge instructions printed and verbally reviewed. Patient verbalized understanding.

## 2017-11-14 NOTE — Interval H&P Note (Signed)
History and Physical Interval Note:  11/14/2017 7:15 AM  Jessica Simpson  has presented today for surgery, with the diagnosis of BREAST CANCER  The various methods of treatment have been discussed with the patient and family. After consideration of risks, benefits and other options for treatment, the patient has consented to  Procedure(s): INSERTION PORT-A-CATH WITH [place] (N/A) as a surgical intervention .  The patient's history has been reviewed, patient examined, no change in status, stable for surgery.  I have reviewed the patient's chart and labs.  Questions were answered to the patient's satisfaction.     Rolm Bookbinder

## 2017-11-14 NOTE — Discharge Instructions (Signed)
    PORT-A-CATH: POST OP INSTRUCTIONS  Always review your discharge instruction sheet given to you by the facility where your surgery was performed.   1. A prescription for pain medication may be given to you upon discharge. Take your pain medication as prescribed, if needed. If narcotic pain medicine is not needed, then you make take acetaminophen (Tylenol) or ibuprofen (Advil) as needed.  2. Take your usually prescribed medications unless otherwise directed. 3. If you need a refill on your pain medication, please contact our office. All narcotic pain medicine now requires a paper prescription.  Phoned in and fax refills are no longer allowed by law.  Prescriptions will not be filled after 5 pm or on weekends.  4. You should follow a light diet for the remainder of the day after your procedure. 5. Most patients will experience some mild swelling and/or bruising in the area of the incision. It may take several days to resolve. 6. It is common to experience some constipation if taking pain medication after surgery. Increasing fluid intake and taking a stool softener (such as Colace) will usually help or prevent this problem from occurring. A mild laxative (Milk of Magnesia or Miralax) should be taken according to package directions if there are no bowel movements after 48 hours.  7. Unless discharge instructions indicate otherwise, you may remove your bandages 48 hours after surgery, and you may shower at that time. You may have steri-strips (small white skin tapes) in place directly over the incision.  These strips should be left on the skin for 7-10 days.  If your surgeon used Dermabond (skin glue) on the incision, you may shower in 24 hours.  The glue will flake off over the next 2-3 weeks.  8. If your port is left accessed at the end of surgery (needle left in port), the dressing cannot get wet and should only by changed by a healthcare professional. When the port is no longer accessed (when the  needle has been removed), follow step 7.   9. ACTIVITIES:  Limit activity involving your arms for the next 72 hours. Do no strenuous exercise or activity for 1 week. You may drive when you are no longer taking prescription pain medication, you can comfortably wear a seatbelt, and you can maneuver your car. 10.You may need to see your doctor in the office for a follow-up appointment.  Please       check with your doctor.  11.When you receive a new Port-a-Cath, you will get a product guide and        ID card.  Please keep them in case you need them.  WHEN TO CALL YOUR DOCTOR ([phone]): 1. Fever over 101.0 2. Chills 3. Continued bleeding from incision 4. Increased redness and tenderness at the site 5. Shortness of breath, difficulty breathing   The clinic staff is available to answer your questions during regular business hours. Please don't hesitate to call and ask to speak to one of the nurses or medical assistants for clinical concerns. If you have a medical emergency, go to the nearest emergency room or call 911.  A surgeon from [facility] is always on call at the hospital.     For further information, please visit [email or web address]      

## 2017-11-14 NOTE — Progress Notes (Signed)
Patient Care Team: Patient, [name] as PCP - General ([facility]) Neldon Mc, MD as Surgeon (General Surgery) Everardo All, MD (Hematology and Oncology)  DIAGNOSIS:  Encounter Diagnosis  Name Primary?  . Malignant neoplasm of upper-outer quadrant of left breast in female, estrogen receptor negative ([place])     SUMMARY OF ONCOLOGIC HISTORY:   Breast cancer, IDC, Left, Stage III, Triple negative   06/30/2011 Initial Diagnosis    Breast cancer, IDC, Left, Stage III, Triple negative, 7.6 cm breast mass and palpable axillary mass      09/06/2011 Miscellaneous    BRCA 1 and 2: Negative      09/14/2011 - 11/23/2011 Neo-Adjuvant Chemotherapy    Dose dense FEC followed by dose dense Taxotere      12/21/2011 Surgery    Left lumpectomy: High-grade poorly differentiated IDC 1.7 cm with high-grade DCIS, margins negative, 3/7 lymph nodes positive, ER 0%, PR 0%, HER-2 negative ratio 1.33 T1CN1 stage IIb      12/31/2011 - 02/15/2012 Radiation Therapy    Radiation at [facility]      09/26/2017 Relapse/Recurrence    Left breast upper outer quadrant within the lumpectomy bed: Fibrosis no malignancy, right axillary lymph node biopsy metastatic high-grade carcinoma ER 0%, PR 0%, Ki-67 90%, HER-2 negative ratio 1.11 (similar to previous ductal carcinoma)      11/14/2017 -  Chemotherapy    Adjuvant chemotherapy with Gemzar and carboplatin days 1 and 8 every 3 weeks       CHIEF COMPLIANT: Cycle 1 Gemzar carboplatin  INTERVAL HISTORY: Jessica Simpson is a 47 year old with above-mentioned history of breast cancer is here to receive her first cycle of adjuvant chemotherapy with Gemzar and carboplatin.  She had a port placed this morning and is here to receive her first cycle of chemotherapy.  She denies any nausea vomiting.  REVIEW OF SYSTEMS:   Constitutional: Denies fevers, chills or abnormal weight loss Eyes: Denies blurriness of vision Ears, nose, mouth, throat, and face: Denies  mucositis or sore throat Respiratory: Denies cough, dyspnea or wheezes Cardiovascular: Denies palpitation, chest discomfort Gastrointestinal:  Denies nausea, heartburn or change in bowel habits Skin: Denies abnormal skin rashes Lymphatics: Denies new lymphadenopathy or easy bruising Neurological:Denies numbness, tingling or new weaknesses Behavioral/Psych: Mood is stable, no new changes  Extremities: No lower extremity edema  All other systems were reviewed with the patient and are negative.  I have reviewed the past medical history, past surgical history, social history and family history with the patient and they are unchanged from previous note.  ALLERGIES:  is allergic to codeine and lortab [hydrocodone-acetaminophen].  MEDICATIONS:  Current Outpatient Medications  Medication Sig Dispense Refill  . acetaminophen (TYLENOL) 500 MG tablet Take 500-1,000 mg by mouth every 6 (six) hours as needed. For pain    . amLODipine (NORVASC) 5 MG tablet Take 5 mg by mouth daily.  6  . Cholecalciferol 5000 units capsule Take 1 capsule (5,000 Units total) by mouth daily.    Marland Kitchen lidocaine-prilocaine (EMLA) cream Apply to affected area once 30 g 3  . loratadine (CLARITIN) 10 MG tablet Take 10 mg by mouth daily as needed (for seasonal allergies (Spring)).    Marland Kitchen LORazepam (ATIVAN) 0.5 MG tablet Take 1 tablet (0.5 mg total) by mouth at bedtime as needed for sleep. 30 tablet 0  . ondansetron (ZOFRAN) 8 MG tablet Take 1 tablet (8 mg total) by mouth 2 (two) times daily as needed for refractory nausea / vomiting. Start on  day 3 after chemotherapy. 30 tablet 1  . prochlorperazine (COMPAZINE) 10 MG tablet Take 1 tablet (10 mg total) by mouth every 6 (six) hours as needed (Nausea or vomiting). 30 tablet 1  . traMADol (ULTRAM) 50 MG tablet Take 2 tablets (100 mg total) by mouth every 6 (six) hours as needed. 10 tablet 0   No current facility-administered medications for this visit.    Facility-Administered  Medications Ordered in Other Visits  Medication Dose Route Frequency Provider Last Rate Last Dose  . lactated ringers infusion    Continuous PRN Kerby Less, CRNA        PHYSICAL EXAMINATION: ECOG PERFORMANCE STATUS: 1 - Symptomatic but completely ambulatory  Vitals:   11/14/17 1136  BP: 140/82  Pulse: 91  Resp: 20  Temp: (!) 97.4 F (36.3 C)  SpO2: 100%   Filed Weights   11/14/17 1136  Weight: 217 lb 3.2 oz (98.5 kg)    GENERAL:alert, no distress and comfortable SKIN: skin color, texture, turgor are normal, no rashes or significant lesions EYES: normal, Conjunctiva are pink and non-injected, sclera clear OROPHARYNX:no exudate, no erythema and lips, buccal mucosa, and tongue normal  NECK: supple, thyroid normal size, non-tender, without nodularity LYMPH:  no palpable lymphadenopathy in the cervical, axillary or inguinal LUNGS: clear to auscultation and percussion with normal breathing effort HEART: regular rate & rhythm and no murmurs and no lower extremity edema ABDOMEN:abdomen soft, non-tender and normal bowel sounds MUSCULOSKELETAL:no cyanosis of digits and no clubbing  NEURO: alert & oriented x 3 with fluent speech, no focal motor/sensory deficits EXTREMITIES: No lower extremity edema  LABORATORY DATA:  I have reviewed the data as listed CMP Latest Ref Rng & Units 11/09/2017 10/14/2017 10/22/2016  Glucose 65 - 99 mg/dL 120(H) 132(H) 136(H)  BUN 6 - 20 mg/dL _0 Creatinine 0.44 - 1.00 mg/dL 0.92 0.96 0.91  Sodium 135 - 145 mmol/L 138 137 135  Potassium 3.5 - 5.1 mmol/L 3.5 3.3(L) 3.5  Chloride 101 - 111 mmol/L 106 102 101  CO2 22 - 32 mmol/L _1 Calcium 8.9 - 10.3 mg/dL 9.4 9.4 9.1  Total Protein 6.5 - 8.1 g/dL - 7.8 7.8  Total Bilirubin 0.3 - 1.2 mg/dL - 0.4 0.6  Alkaline Phos 38 - 126 U/L - 95 85  AST 15 - 41 U/L - 18 18  ALT 14 - 54 U/L - 15 15    Lab Results  Component Value Date   WBC 6.4 11/09/2017   HGB 12.6 11/09/2017   HCT 38.9  11/09/2017   MCV 81.4 11/09/2017   PLT 287 11/09/2017   NEUTROABS 4.1 10/14/2017    ASSESSMENT & PLAN:  Breast cancer, IDC, Left, Stage III, Triple negative 06/30/2011:Breast cancer, IDC, Left, Stage III, Triple negative, 7.6 cm breast mass and palpable axillary mass November 2012 to January 2013: Neoadjuvant chemotherapy with dose dense FEC followed by dose dense Taxotere 12/21/2011: Left lumpectomy: High-grade poorly differentiated IDC 1.7 cm with high-grade DCIS, margins negative, 3/7 lymph nodes positive, ER 0%, PR 0%, HER-2 negative ratio 1.33 T1CN1 stage IIb  RELAPSE 09/26/2017: Left breast upper outer quadrant within the lumpectomy bed: Fibrosis no malignancy, right axillary lymph node biopsy metastatic high-grade carcinoma ER 0%, PR 0%, Ki-67 90%, HER-2 negative ratio 1.11 (similar to previous ductal carcinoma)  Current treatment: Carboplatin and gemcitabine days 1 and 8 every 3 weeks x6 cycles adjuvant therapy  Return to clinic in 1 week for cycle 1 day 8  I spent 25 minutes talking to the patient of which more than half was spent in counseling and coordination of care.  No orders of the defined types were placed in this encounter.  The patient has a good understanding of the overall plan. she agrees with it. she will call with any problems that may develop before the next visit here.   Harriette Ohara, MD 11/14/17

## 2017-11-14 NOTE — Op Note (Signed)
Preoperative diagnosis:breast cancer metastatic to lymph node Postoperative diagnosis: same as above Procedure: right ij US guided powerport insertion Surgeon: Dr Serita Grammes EBL: minimal Anes: general  Specimens none Complications none Drains none Sponge count correct Dispo to pacu stable  Indications: This is a 15 yof with history of left breast cancer who presents with right axillary lymphadenopathy. Core biopsy consistent with breast primary. She has mri with no disease in either breast and she has negative staging.  She has seen Dr Lindi Adie and is scheduled to begin chemotherapy today. We discussed port placement. She would like in same place last one was. This was subclavian so will plan IJ placement  Procedure: After informed consent was obtained the patient was taken to the operating room. She was given antibiotics. Sequential compression devices were on her legs. She was then placed under general anesthesia with an LMA. Then she was prepped and draped in the standard sterile surgical fashion. Surgical timeout was then performed.  I used the ultrasound to identify the right internal jugular vein. I then accessed the vein using the ultrasound.This aspirated blood. I then placed the wire. This was confirmed by fluoroscopy and ultrasound to be in the correct position.I tunneled the line between the 2 sites. She has a lot of scar tissue below clavicle from her old pot. The tract was difficult to pass the dilator and I bent the first one.   I then dilated the tract and placed the dilator assembly with the sheath. This was done under fluoroscopy. I then removed the sheath and dilator. The wire was also removed. The line was then pulled back to be in the venacava. I hooked this up to the port. I sutured this into place with 2-0 Prolene in 2 places. This aspirated blood and flushed easily.This was confirmed with a final fluoroscopy. I then closed this with 2-0 Vicryl and 4-0  Monocryl.This withdrew blood and I placed heparin in it. I accessed the port with the needle and left this in place for chemotherapy later today. Dermabond was placed on both the incisions.A dressing was placed. She tolerated this well and was transferred to the recovery room in stable condition

## 2017-11-14 NOTE — Assessment & Plan Note (Signed)
06/30/2011:Breast cancer, IDC, Left, Stage III, Triple negative, 7.6 cm breast mass and palpable axillary mass November 2012 to January 2013: Neoadjuvant chemotherapy with dose dense FEC followed by dose dense Taxotere 12/21/2011: Left lumpectomy: High-grade poorly differentiated IDC 1.7 cm with high-grade DCIS, margins negative, 3/7 lymph nodes positive, ER 0%, PR 0%, HER-2 negative ratio 1.33 T1CN1 stage IIb  RELAPSE 09/26/2017: Left breast upper outer quadrant within the lumpectomy bed: Fibrosis no malignancy, right axillary lymph node biopsy metastatic high-grade carcinoma ER 0%, PR 0%, Ki-67 90%, HER-2 negative ratio 1.11 (similar to previous ductal carcinoma)  Current treatment: Carboplatin and gemcitabine days 1 and 8 every 3 weeks x6 cycles adjuvant therapy  Return to clinic in 1 week for cycle 1 day 8

## 2017-11-14 NOTE — OR Nursing (Signed)
Discharge instructions were reviewed and given to pt and pt family/friend. Pt port was left accessed because pt will be going over to have chemotherapy at [facility] after she eats lunch. This is the plan per Dr. Donne Hazel. Pt and family denied any questions regarding discharge teaching/instructions. Pt pain level tolerable and denies any n/v.

## 2017-11-14 NOTE — H&P (Signed)
47 yof referred by Mike Craze for right axillary node core biopsy c/w breast cancer. she has history of a stage III TNBC in 2012 that was treated with primary chemotherapy followed by lump/alnd. there was residual cancer and 3/7 nodes had remaining cancer. she then underwent radiation therapy. she had a right sided subclavian port at that time. she did undergo genetic testing then that by report was negative for brca 1 and 2. she does have a 47 yo daughter. 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 she does not have any family help there. she noted a low lying axillary breast mass recently. she had done fine until then. she underwent mm november 2018. her density is c. she was noted to have new grouped pleomorphic calcs in the lumpectomy site of the left breast spanning 6 mm. the right breast has a morphologically abnl low lying node present. US shows an abnl node in right axillary tail with no addiitonal nodes noted. core biopsy of the left is fat necrosis, fibrosis and calcs and this is concordant. the right axillary node is metastatic high grade carcinoma (will ask if any lymphoid tissue present) that is TN and Ki is 90%. she has undergone pet that shows hypermetabolic 1.3 cm right axillary node, postop findings on left. there is question of possible right ovarian activity. she is here today to discuss options having [facility] locums oncologist at [facility]. she has no discharge, no other complaints Has seen med onc at [facility] long and due to begin chemo. Mri showed only a single right axillary node.    Past Surgical History Benjiman Core, CMA; 10/28/2017 10:55 AM) Anal Fissure Repair  Breast Biopsy  Bilateral. Breast Mass; Local Excision  Left.  Diagnostic Studies History Benjiman Core, [place]; 10/28/2017 10:55 AM) Colonoscopy  1-5 years ago Mammogram  within last year Pap Smear  1-5 years ago  Allergies Benjiman Core, [place]; 10/28/2017 10:58 AM) [place] Meth   Medication  History Benjiman Core, CMA; 10/28/2017 10:59 AM) Acetaminophen (500MG Tablet, Oral) Active. Norvasc (5MG Tablet, Oral) Active. Cholecalciferol (5000UNIT Capsule, Oral) Active. Claritin (10MG Tablet, Oral) Active. Medications Reconciled  Social History Benjiman Core, CMA; 10/28/2017 10:55 AM) Caffeine use  Carbonated beverages. No alcohol use  No drug use  Tobacco use  Never smoker.  Family History Benjiman Core, [place]; 10/28/2017 10:55 AM) Heart disease in female family member before age 82  Hypertension  Mother, Sister. Migraine Headache  Mother.  Pregnancy / Birth History Benjiman Core, [place]; 10/28/2017 10:55 AM) Age at menarche  58 years. Contraceptive History  Oral contraceptives. Gravida  1 Maternal age  19-30 Para  1 Regular periods   Other Problems Benjiman Core, CMA; 10/28/2017 10:55 AM) Breast Cancer  High blood pressure  Lump In Breast     Review of Systems (Armen Glenn CMA; 10/28/2017 10:55 AM) General Not Present- Appetite Loss, Chills, Fatigue, Fever, Night Sweats, Weight Gain and Weight Loss. Skin Not Present- Change in Wart/Mole, Dryness, Hives, Jaundice, New Lesions, Non-Healing Wounds, Rash and Ulcer. HEENT Not Present- Earache, Hearing Loss, Hoarseness, Nose Bleed, Oral Ulcers, Ringing in the Ears, Seasonal Allergies, Sinus Pain, Sore Throat, Visual Disturbances, Wears glasses/contact lenses and Yellow Eyes. Respiratory Not Present- Bloody sputum, Chronic Cough, Difficulty Breathing, Snoring and Wheezing. Breast Present- Breast Mass. Not Present- Breast Pain, Nipple Discharge and Skin Changes. Cardiovascular Present- Swelling of Extremities. Not Present- Chest Pain, Difficulty Breathing Lying Down, Leg Cramps, Palpitations, Rapid Heart Rate and Shortness of Breath. Gastrointestinal Not Present-  Abdominal Pain, Bloating, Bloody Stool, Change in Bowel Habits, Chronic diarrhea, Constipation, Difficulty Swallowing, Excessive gas, Gets full quickly at meals,  Hemorrhoids, Indigestion, Nausea, Rectal Pain and Vomiting. Female Genitourinary Not Present- Frequency, Nocturia, Painful Urination, Pelvic Pain and Urgency. Musculoskeletal Not Present- Back Pain, Joint Pain, Joint Stiffness, Muscle Pain, Muscle Weakness and Swelling of Extremities. Neurological Not Present- Decreased Memory, Fainting, Headaches, Numbness, Seizures, Tingling, Tremor, Trouble walking and Weakness. Endocrine Not Present- Cold Intolerance, Excessive Hunger, Hair Changes, Heat Intolerance, Hot flashes and New Diabetes.  Vitals (Armen Glenn CMA; 10/28/2017 10:57 AM) 10/28/2017 10:57 AM Weight: 216 lb Height: 65in Body Surface Area: 2.04 m Body Mass Index: 35.94 kg/m  Temp.: 9F  Pulse: 112 (Regular)  P.OX: 97% (Room air) BP: 144/92 (Sitting, Left Arm, Standard)       Physical Exam Rolm Bookbinder MD; 10/28/2017 12:30 PM) General Mental Status-Alert. Orientation-Oriented X3.  Head and Neck Trachea-midline.  Eye Sclera/Conjunctiva - Bilateral-No scleral icterus.  Chest and Lung Exam Chest and lung exam reveals -quiet, even and easy respiratory effort with no use of accessory muscles and on auscultation, normal breath sounds, no adventitious sounds and normal vocal resonance.  Breast Nipples-No Discharge. Breast Lump-No Palpable Breast Mass.   Cardiovascular Cardiovascular examination reveals -normal heart sounds, regular rate and rhythm with no murmurs and no digital clubbing, cyanosis, edema, increased warmth or tenderness.  Lymphatic Head & Neck  General Head & Neck Lymphatics: Bilateral - Description - Normal. Axillary -Note: single low lying axillary node.  Note: no [place] adenopathy   Assessment & Plan Rolm Bookbinder MD; 10/28/2017 12:33 PM) BREAST CANCER METASTASIZED TO AXILLARY LYMPH NODE, RIGHT (C50.911) Port placement

## 2017-11-14 NOTE — Transfer of Care (Signed)
Immediate Anesthesia Transfer of Care Note  Patient: Jessica Simpson  Procedure(s) Performed: INSERTION PORT-A-CATH WITH [place] (Right Chest)  Patient Location: PACU  Anesthesia Type:General  Level of Consciousness: sedated  Airway & Oxygen Therapy: Patient Spontanous Breathing and Patient connected to face mask oxygen  Post-op Assessment: Report given to RN and Post -op Vital signs reviewed and stable  Post vital signs: Reviewed and stable  Last Vitals:  Vitals:   11/14/17 0613 11/14/17 0838  BP: (!) 158/89   Pulse: 83   Resp: 19   Temp: 36.7 C (P) 36.7 C  SpO2: 100%     Last Pain:  Vitals:   11/14/17 0613  TempSrc: Oral      Patients Stated Pain Goal: 2 (94/17/40 8144)  Complications: No apparent anesthesia complications

## 2017-11-15 ENCOUNTER — Encounter (HOSPITAL_COMMUNITY): Payer: Self-pay | Admitting: General Surgery

## 2017-11-15 NOTE — Anesthesia Postprocedure Evaluation (Signed)
Anesthesia Post Note  Patient: Jessica Simpson  Procedure(s) Performed: INSERTION PORT-A-CATH WITH [place] (Right Chest)     Patient location during evaluation: PACU Anesthesia Type: General Level of consciousness: awake and alert Pain management: pain level controlled Vital Signs Assessment: post-procedure vital signs reviewed and stable Respiratory status: spontaneous breathing, nonlabored ventilation and respiratory function stable Cardiovascular status: blood pressure returned to baseline and stable Postop Assessment: no apparent nausea or vomiting Anesthetic complications: no    Last Vitals:  Vitals:   11/14/17 0929 11/14/17 0930  BP: (!) 144/88 (!) 147/93  Pulse: 89   Resp: 14 16  Temp: (!) 36.4 C   SpO2: 99% 100%    Last Pain:  Vitals:   11/14/17 0908  TempSrc:   PainSc: 5                  Ruhee Enck,W. EDMOND

## 2017-11-16 ENCOUNTER — Telehealth: Payer: Self-pay | Admitting: Hematology and Oncology

## 2017-11-16 NOTE — Telephone Encounter (Signed)
11/16/17 @ 1:40 pm called and left vm for patient confirming FMLA paperwork was successfully faxed to [employer] @ [phone] on 11/14/17 @ 11:36 am

## 2017-11-16 NOTE — Telephone Encounter (Signed)
No 1/21 los.  °

## 2017-11-18 ENCOUNTER — Other Ambulatory Visit: Payer: BLUE CROSS/BLUE SHIELD

## 2017-11-18 ENCOUNTER — Ambulatory Visit: Payer: BLUE CROSS/BLUE SHIELD | Admitting: Hematology and Oncology

## 2017-11-20 NOTE — Assessment & Plan Note (Signed)
06/30/2011:Breast cancer, IDC, Left, Stage III, Triple negative, 7.6 cm breast mass and palpable axillary mass November 2012 to January 2013: Neoadjuvant chemotherapy with dose dense FEC followed by dose dense Taxotere 12/21/2011: Left lumpectomy: High-grade poorly differentiated IDC 1.7 cm with high-grade DCIS, margins negative, 3/7 lymph nodes positive, ER 0%, PR 0%, HER-2 negative ratio 1.33 T1CN1 stage IIb  RELAPSE 09/26/2017: Left breast upper outer quadrant within the lumpectomy bed: Fibrosis no malignancy, right axillary lymph node biopsy metastatic high-grade carcinoma ER 0%, PR 0%, Ki-67 90%, HER-2 negative ratio 1.11 (similar to previous ductal carcinoma)  Current treatment: Carboplatin and gemcitabine days 1 and 8 every 3 weeks x6 cycles adjuvant therapy. Today is cycle 1 day 8  Return to clinic in 2 week for cycle 2

## 2017-11-21 ENCOUNTER — Other Ambulatory Visit: Payer: Self-pay

## 2017-11-21 ENCOUNTER — Telehealth: Payer: Self-pay | Admitting: Hematology and Oncology

## 2017-11-21 ENCOUNTER — Inpatient Hospital Stay: Payer: BLUE CROSS/BLUE SHIELD

## 2017-11-21 ENCOUNTER — Inpatient Hospital Stay (HOSPITAL_BASED_OUTPATIENT_CLINIC_OR_DEPARTMENT_OTHER): Payer: BLUE CROSS/BLUE SHIELD | Admitting: Hematology and Oncology

## 2017-11-21 ENCOUNTER — Encounter: Payer: Self-pay | Admitting: *Deleted

## 2017-11-21 DIAGNOSIS — Z8781 Personal history of (healed) traumatic fracture: Secondary | ICD-10-CM

## 2017-11-21 DIAGNOSIS — C50012 Malignant neoplasm of nipple and areola, left female breast: Secondary | ICD-10-CM

## 2017-11-21 DIAGNOSIS — Z171 Estrogen receptor negative status [ER-]: Principal | ICD-10-CM

## 2017-11-21 DIAGNOSIS — I890 Lymphedema, not elsewhere classified: Secondary | ICD-10-CM | POA: Diagnosis not present

## 2017-11-21 DIAGNOSIS — K5900 Constipation, unspecified: Secondary | ICD-10-CM | POA: Diagnosis not present

## 2017-11-21 DIAGNOSIS — N6331 Unspecified lump in axillary tail of the right breast: Secondary | ICD-10-CM

## 2017-11-21 DIAGNOSIS — Z5111 Encounter for antineoplastic chemotherapy: Secondary | ICD-10-CM | POA: Diagnosis not present

## 2017-11-21 DIAGNOSIS — I10 Essential (primary) hypertension: Secondary | ICD-10-CM | POA: Diagnosis not present

## 2017-11-21 DIAGNOSIS — Z9221 Personal history of antineoplastic chemotherapy: Secondary | ICD-10-CM

## 2017-11-21 DIAGNOSIS — Z79899 Other long term (current) drug therapy: Secondary | ICD-10-CM | POA: Diagnosis not present

## 2017-11-21 DIAGNOSIS — C50412 Malignant neoplasm of upper-outer quadrant of left female breast: Secondary | ICD-10-CM | POA: Diagnosis not present

## 2017-11-21 DIAGNOSIS — C773 Secondary and unspecified malignant neoplasm of axilla and upper limb lymph nodes: Secondary | ICD-10-CM | POA: Diagnosis not present

## 2017-11-21 DIAGNOSIS — Z803 Family history of malignant neoplasm of breast: Secondary | ICD-10-CM

## 2017-11-21 DIAGNOSIS — Z923 Personal history of irradiation: Secondary | ICD-10-CM | POA: Diagnosis not present

## 2017-11-21 DIAGNOSIS — Z95828 Presence of other vascular implants and grafts: Secondary | ICD-10-CM

## 2017-11-21 DIAGNOSIS — C171 Malignant neoplasm of jejunum: Secondary | ICD-10-CM

## 2017-11-21 DIAGNOSIS — Z801 Family history of malignant neoplasm of trachea, bronchus and lung: Secondary | ICD-10-CM

## 2017-11-21 DIAGNOSIS — Z853 Personal history of malignant neoplasm of breast: Secondary | ICD-10-CM

## 2017-11-21 LAB — CMP (CANCER CENTER ONLY)
ALBUMIN: 3.6 g/dL (ref 3.5–5.0)
ALK PHOS: 90 U/L (ref 40–150)
ALT: 25 U/L (ref 0–55)
AST: 14 U/L (ref 5–34)
Anion gap: 8 (ref 3–11)
BILIRUBIN TOTAL: 0.3 mg/dL (ref 0.2–1.2)
BUN: 15 mg/dL (ref 7–26)
CALCIUM: 8.9 mg/dL (ref 8.4–10.4)
CO2: 26 mmol/L (ref 22–29)
Chloride: 105 mmol/L (ref 98–109)
Creatinine: 0.95 mg/dL (ref 0.60–1.10)
GFR, Est AFR Am: >60 mL/min (ref >60)
GFR, Estimated: >60 mL/min (ref >60)
GLUCOSE: 121 mg/dL (ref 70–140)
Potassium: 3.6 mmol/L (ref 3.3–4.7)
Sodium: 139 mmol/L (ref 136–145)
TOTAL PROTEIN: 7.2 g/dL (ref 6.4–8.3)

## 2017-11-21 LAB — CBC WITH DIFFERENTIAL (CANCER CENTER ONLY)
BASOS ABS: 0 10*3/uL (ref 0.0–0.1)
BASOS PCT: 1 %
Eosinophils Absolute: 0.1 10*3/uL (ref 0.0–0.5)
Eosinophils Relative: 2 %
HEMATOCRIT: 34.5 % — AB (ref 34.8–46.6)
HEMOGLOBIN: 11.2 g/dL — AB (ref 11.6–15.9)
Lymphocytes Relative: 54 %
Lymphs Abs: 1.7 10*3/uL (ref 0.9–3.3)
MCH: 25.9 pg (ref 25.1–34.0)
MCHC: 32.5 g/dL (ref 31.5–36.0)
MCV: 79.9 fL (ref 79.5–101.0)
Monocytes Absolute: 0.1 10*3/uL (ref 0.1–0.9)
Monocytes Relative: 3 %
NEUTROS ABS: 1.2 10*3/uL — AB (ref 1.5–6.5)
NEUTROS PCT: 40 %
Platelet Count: 223 10*3/uL (ref 145–400)
RBC: 4.32 MIL/uL (ref 3.70–5.45)
RDW: 13.6 % (ref 11.2–16.1)
WBC Count: 3.1 10*3/uL — ABNORMAL LOW (ref 3.9–10.3)

## 2017-11-21 MED ORDER — SODIUM CHLORIDE 0.9% FLUSH
10.0000 mL | INTRAVENOUS | Status: DC | PRN
  Administered 2017-11-21: 10 mL
  Filled 2017-11-21: qty 10

## 2017-11-21 MED ORDER — SODIUM CHLORIDE 0.9% FLUSH
10.0000 mL | Freq: Once | INTRAVENOUS | Status: AC
  Administered 2017-11-21: 10 mL
  Filled 2017-11-21: qty 10

## 2017-11-21 MED ORDER — DEXAMETHASONE SODIUM PHOSPHATE 10 MG/ML IJ SOLN
10.0000 mg | Freq: Once | INTRAMUSCULAR | Status: AC
  Administered 2017-11-21: 10 mg via INTRAVENOUS

## 2017-11-21 MED ORDER — SODIUM CHLORIDE 0.9 % IV SOLN
1000.0000 mg/m2 | Freq: Once | INTRAVENOUS | Status: AC
  Administered 2017-11-21: 2128 mg via INTRAVENOUS
  Filled 2017-11-21: qty 55.97

## 2017-11-21 MED ORDER — PALONOSETRON HCL INJECTION 0.25 MG/5ML
0.2500 mg | Freq: Once | INTRAVENOUS | Status: AC
  Administered 2017-11-21: 0.25 mg via INTRAVENOUS

## 2017-11-21 MED ORDER — HEPARIN SOD (PORK) LOCK FLUSH 100 UNIT/ML IV SOLN
500.0000 [IU] | Freq: Once | INTRAVENOUS | Status: AC | PRN
  Administered 2017-11-21: 500 [IU]
  Filled 2017-11-21: qty 5

## 2017-11-21 MED ORDER — SODIUM CHLORIDE 0.9 % IV SOLN
284.2000 mg | Freq: Once | INTRAVENOUS | Status: AC
  Administered 2017-11-21: 280 mg via INTRAVENOUS
  Filled 2017-11-21: qty 28

## 2017-11-21 MED ORDER — SODIUM CHLORIDE 0.9 % IV SOLN
Freq: Once | INTRAVENOUS | Status: AC
  Administered 2017-11-21: 15:00:00 via INTRAVENOUS

## 2017-11-21 MED ORDER — PEGFILGRASTIM INJECTION 6 MG/0.6ML ~~LOC~~: 6.0000 mg | PREFILLED_SYRINGE | Freq: Once | SUBCUTANEOUS | Status: DC

## 2017-11-21 NOTE — Progress Notes (Signed)
Patient ok to treat today with ANC of 1.2 per Dr. Lindi Adie. Pt is to receive Neulasta on 01/30 day 10 of cylcle.  Cyndia Bent RN

## 2017-11-21 NOTE — Patient Instructions (Signed)
[facility] Discharge Instructions for Patients Receiving Chemotherapy  Today you received the following chemotherapy agents Gemcitabine, Carboplatin  To help prevent nausea and vomiting after your treatment, we encourage you to take your nausea medication as directed   If you develop nausea and vomiting that is not controlled by your nausea medication, call the clinic.   BELOW ARE SYMPTOMS THAT SHOULD BE REPORTED IMMEDIATELY:  *FEVER GREATER THAN 100.5 F  *CHILLS WITH OR WITHOUT FEVER  NAUSEA AND VOMITING THAT IS NOT CONTROLLED WITH YOUR NAUSEA MEDICATION  *UNUSUAL SHORTNESS OF BREATH  *UNUSUAL BRUISING OR BLEEDING  TENDERNESS IN MOUTH AND THROAT WITH OR WITHOUT PRESENCE OF ULCERS  *URINARY PROBLEMS  *BOWEL PROBLEMS  UNUSUAL RASH Items with * indicate a potential emergency and should be followed up as soon as possible.  Feel free to call the clinic should you have any questions or concerns. The clinic phone number is [phone].  Please show the [place] at check-in to the Emergency Department and triage nurse.

## 2017-11-21 NOTE — Telephone Encounter (Signed)
Gave patient AVs and calendar of upcoming January through March appointments.

## 2017-11-21 NOTE — Progress Notes (Signed)
Patient Care Team: Patient, [name] as PCP - General ([place]) Neldon Mc, MD as Surgeon (General Surgery) Everardo All, MD (Hematology and Oncology)  DIAGNOSIS:  Encounter Diagnosis  Name Primary?  . Malignant neoplasm of upper-outer quadrant of left breast in female, estrogen receptor negative ([place])     SUMMARY OF ONCOLOGIC HISTORY:   Breast cancer, IDC, Left, Stage III, Triple negative   06/30/2011 Initial Diagnosis    Breast cancer, IDC, Left, Stage III, Triple negative, 7.6 cm breast mass and palpable axillary mass      09/06/2011 Miscellaneous    BRCA 1 and 2: Negative      09/14/2011 - 11/23/2011 Neo-Adjuvant Chemotherapy    Dose dense FEC followed by dose dense Taxotere      12/21/2011 Surgery    Left lumpectomy: High-grade poorly differentiated IDC 1.7 cm with high-grade DCIS, margins negative, 3/7 lymph nodes positive, ER 0%, PR 0%, HER-2 negative ratio 1.33 T1CN1 stage IIb      12/31/2011 - 02/15/2012 Radiation Therapy    Radiation at [facility]      09/26/2017 Relapse/Recurrence    Left breast upper outer quadrant within the lumpectomy bed: Fibrosis no malignancy, right axillary lymph node biopsy metastatic high-grade carcinoma ER 0%, PR 0%, Ki-67 90%, HER-2 negative ratio 1.11 (similar to previous ductal carcinoma)      11/14/2017 -  Chemotherapy    Adjuvant chemotherapy with Gemzar and carboplatin days 1 and 8 every 3 weeks       CHIEF COMPLIANT: Cycle 1 day 8 carboplatin and gemcitabine  INTERVAL HISTORY: Jessica Simpson is a 52-year with above-mentioned history of recurrent left breast cancer who is currently on adjuvant chemotherapy and today is cycle 1 day 8 of carboplatin and gemcitabine.  Overall she did very well with the chemotherapy.  Did not have any nausea or vomiting.  Did have fatigue.  REVIEW OF SYSTEMS:   Constitutional: Denies fevers, chills or abnormal weight loss Eyes: Denies blurriness of vision Ears, nose, mouth,  throat, and face: Denies mucositis or sore throat Respiratory: Denies cough, dyspnea or wheezes Cardiovascular: Denies palpitation, chest discomfort Gastrointestinal:  Denies nausea, heartburn or change in bowel habits Skin: Denies abnormal skin rashes Lymphatics: Denies new lymphadenopathy or easy bruising Neurological:Denies numbness, tingling or new weaknesses Behavioral/Psych: Mood is stable, no new changes  Extremities: No lower extremity edema Breast:  denies any pain or lumps or nodules in either breasts All other systems were reviewed with the patient and are negative.  I have reviewed the past medical history, past surgical history, social history and family history with the patient and they are unchanged from previous note.  ALLERGIES:  is allergic to codeine and lortab [hydrocodone-acetaminophen].  MEDICATIONS:  Current Outpatient Medications  Medication Sig Dispense Refill  . acetaminophen (TYLENOL) 500 MG tablet Take 500-1,000 mg by mouth every 6 (six) hours as needed. For pain    . amLODipine (NORVASC) 5 MG tablet Take 5 mg by mouth daily.  6  . Cholecalciferol 5000 units capsule Take 1 capsule (5,000 Units total) by mouth daily.    Marland Kitchen lidocaine-prilocaine (EMLA) cream Apply to affected area once 30 g 3  . loratadine (CLARITIN) 10 MG tablet Take 10 mg by mouth daily as needed (for seasonal allergies (Spring)).    Marland Kitchen LORazepam (ATIVAN) 0.5 MG tablet Take 1 tablet (0.5 mg total) by mouth at bedtime as needed for sleep. 30 tablet 0  . ondansetron (ZOFRAN) 8 MG tablet Take 1 tablet (8 mg  total) by mouth 2 (two) times daily as needed for refractory nausea / vomiting. Start on day 3 after chemotherapy. 30 tablet 1  . prochlorperazine (COMPAZINE) 10 MG tablet Take 1 tablet (10 mg total) by mouth every 6 (six) hours as needed (Nausea or vomiting). 30 tablet 1  . traMADol (ULTRAM) 50 MG tablet Take 2 tablets (100 mg total) by mouth every 6 (six) hours as needed. 10 tablet 0   No  current facility-administered medications for this visit.    Facility-Administered Medications Ordered in Other Visits  Medication Dose Route Frequency Provider Last Rate Last Dose  . CARBOplatin (PARAPLATIN) 280 mg in sodium chloride 0.9 % 250 mL chemo infusion  280 mg Intravenous Once Nicholas Lose, MD 556 mL/hr at 11/21/17 1630 280 mg at 11/21/17 1630  . heparin lock flush 100 unit/mL  500 Units Intracatheter Once PRN Nicholas Lose, MD      . lactated ringers infusion    Continuous PRN Kerby Less, CRNA      . sodium chloride flush (NS) 0.9 % injection 10 mL  10 mL Intracatheter PRN Nicholas Lose, MD        PHYSICAL EXAMINATION: ECOG PERFORMANCE STATUS: 1 - Symptomatic but completely ambulatory  Vitals:   11/21/17 1412  BP: (!) 147/88  Pulse: 82  Resp: 16  Temp: 97.9 F (36.6 C)  SpO2: 100%   Filed Weights   11/21/17 1412  Weight: 216 lb 8 oz (98.2 kg)    GENERAL:alert, no distress and comfortable SKIN: skin color, texture, turgor are normal, no rashes or significant lesions EYES: normal, Conjunctiva are pink and non-injected, sclera clear OROPHARYNX:no exudate, no erythema and lips, buccal mucosa, and tongue normal  NECK: supple, thyroid normal size, non-tender, without nodularity LYMPH:  no palpable lymphadenopathy in the cervical, axillary or inguinal LUNGS: clear to auscultation and percussion with normal breathing effort HEART: regular rate & rhythm and no murmurs and no lower extremity edema ABDOMEN:abdomen soft, non-tender and normal bowel sounds MUSCULOSKELETAL:no cyanosis of digits and no clubbing  NEURO: alert & oriented x 3 with fluent speech, no focal motor/sensory deficits EXTREMITIES: No lower extremity edema   LABORATORY DATA:  I have reviewed the data as listed CMP Latest Ref Rng & Units 11/21/2017 11/09/2017 10/14/2017  Glucose 70 - 140 mg/dL 121 120(H) 132(H)  BUN 7 - 26 mg/dL 15 10 10   Creatinine 0.44 - 1.00 mg/dL - 0.92 0.96  Sodium 136 -  145 mmol/L 139 138 137  Potassium 3.3 - 4.7 mmol/L 3.6 3.5 3.3(L)  Chloride 98 - 109 mmol/L 105 106 102  CO2 22 - 29 mmol/L 26 23 24   Calcium 8.4 - 10.4 mg/dL 8.9 9.4 9.4  Total Protein 6.4 - 8.3 g/dL 7.2 - 7.8  Total Bilirubin 0.2 - 1.2 mg/dL 0.3 - 0.4  Alkaline Phos 40 - 150 U/L 90 - 95  AST 5 - 34 U/L 14 - 18  ALT 0 - 55 U/L 25 - 15    Lab Results  Component Value Date   WBC 3.1 (L) 11/21/2017   HGB 12.6 11/09/2017   HCT 34.5 (L) 11/21/2017   MCV 79.9 11/21/2017   PLT 223 11/21/2017   NEUTROABS 1.2 (L) 11/21/2017    ASSESSMENT & PLAN:  Breast cancer, IDC, Left, Stage III, Triple negative 06/30/2011:Breast cancer, IDC, Left, Stage III, Triple negative, 7.6 cm breast mass and palpable axillary mass November 2012 to January 2013: Neoadjuvant chemotherapy with dose dense FEC followed by dose dense Taxotere 12/21/2011:  Left lumpectomy: High-grade poorly differentiated IDC 1.7 cm with high-grade DCIS, margins negative, 3/7 lymph nodes positive, ER 0%, PR 0%, HER-2 negative ratio 1.33 T1CN1 stage IIb  RELAPSE 09/26/2017: Left breast upper outer quadrant within the lumpectomy bed: Fibrosis no malignancy, right axillary lymph node biopsy metastatic high-grade carcinoma ER 0%, PR 0%, Ki-67 90%, HER-2 negative ratio 1.11 (similar to previous ductal carcinoma)  Current treatment: Carboplatin and gemcitabine days 1 and 8 every 3 weeks x6 cycles adjuvant therapy. Today is cycle 1 day 8 Today her ANC is 1.2.  I would like to continue with the treatment with Neulasta bio- similar on day 10. Constipation: Currently on MiraLAX  Return to clinic in 2 week for cycle 2  I spent 25 minutes talking to the patient of which more than half was spent in counseling and coordination of care.  No orders of the defined types were placed in this encounter.  The patient has a good understanding of the overall plan. she agrees with it. she will call with any problems that may develop before the next visit  here.   Jessica Ohara, MD 11/21/17

## 2017-11-23 ENCOUNTER — Inpatient Hospital Stay: Payer: BLUE CROSS/BLUE SHIELD

## 2017-11-23 VITALS — BP 142/73 | HR 74 | Temp 98.1°F | Resp 16

## 2017-11-23 DIAGNOSIS — N6331 Unspecified lump in axillary tail of the right breast: Secondary | ICD-10-CM

## 2017-11-23 DIAGNOSIS — C50412 Malignant neoplasm of upper-outer quadrant of left female breast: Secondary | ICD-10-CM | POA: Diagnosis not present

## 2017-11-23 MED ORDER — PEGFILGRASTIM-CBQV 6 MG/0.6ML ~~LOC~~ SOSY
6.0000 mg | PREFILLED_SYRINGE | Freq: Once | SUBCUTANEOUS | Status: AC
  Administered 2017-11-23: 6 mg via SUBCUTANEOUS
  Filled 2017-11-23: qty 0.6

## 2017-11-23 MED ORDER — PEGFILGRASTIM INJECTION 6 MG/0.6ML ~~LOC~~: 6.0000 mg | PREFILLED_SYRINGE | Freq: Once | SUBCUTANEOUS | Status: DC

## 2017-11-25 ENCOUNTER — Ambulatory Visit (HOSPITAL_COMMUNITY): Payer: BLUE CROSS/BLUE SHIELD | Admitting: Internal Medicine

## 2017-12-02 ENCOUNTER — Telehealth: Payer: Self-pay | Admitting: Hematology and Oncology

## 2017-12-02 NOTE — Telephone Encounter (Signed)
12/02/17 @ 2:08 pm left vm for patient to confirm Disability paperwork has been successfully faxed on 11/25/17 @ 9:37 am to [place] @ [phone]

## 2017-12-05 ENCOUNTER — Inpatient Hospital Stay (HOSPITAL_BASED_OUTPATIENT_CLINIC_OR_DEPARTMENT_OTHER): Payer: BLUE CROSS/BLUE SHIELD | Admitting: Hematology and Oncology

## 2017-12-05 ENCOUNTER — Inpatient Hospital Stay: Payer: BLUE CROSS/BLUE SHIELD

## 2017-12-05 ENCOUNTER — Inpatient Hospital Stay: Payer: BLUE CROSS/BLUE SHIELD | Attending: Hematology and Oncology

## 2017-12-05 DIAGNOSIS — Z79899 Other long term (current) drug therapy: Secondary | ICD-10-CM | POA: Diagnosis not present

## 2017-12-05 DIAGNOSIS — Z5111 Encounter for antineoplastic chemotherapy: Secondary | ICD-10-CM | POA: Insufficient documentation

## 2017-12-05 DIAGNOSIS — K5900 Constipation, unspecified: Secondary | ICD-10-CM

## 2017-12-05 DIAGNOSIS — Z923 Personal history of irradiation: Secondary | ICD-10-CM

## 2017-12-05 DIAGNOSIS — R5383 Other fatigue: Secondary | ICD-10-CM | POA: Diagnosis not present

## 2017-12-05 DIAGNOSIS — N6331 Unspecified lump in axillary tail of the right breast: Secondary | ICD-10-CM

## 2017-12-05 DIAGNOSIS — R110 Nausea: Secondary | ICD-10-CM | POA: Insufficient documentation

## 2017-12-05 DIAGNOSIS — C773 Secondary and unspecified malignant neoplasm of axilla and upper limb lymph nodes: Secondary | ICD-10-CM | POA: Diagnosis not present

## 2017-12-05 DIAGNOSIS — Z171 Estrogen receptor negative status [ER-]: Secondary | ICD-10-CM | POA: Diagnosis not present

## 2017-12-05 DIAGNOSIS — Z7183 Encounter for nonprocreative genetic counseling: Secondary | ICD-10-CM | POA: Diagnosis not present

## 2017-12-05 DIAGNOSIS — Z95828 Presence of other vascular implants and grafts: Secondary | ICD-10-CM

## 2017-12-05 DIAGNOSIS — Z803 Family history of malignant neoplasm of breast: Secondary | ICD-10-CM | POA: Insufficient documentation

## 2017-12-05 DIAGNOSIS — Z801 Family history of malignant neoplasm of trachea, bronchus and lung: Secondary | ICD-10-CM | POA: Insufficient documentation

## 2017-12-05 DIAGNOSIS — C50012 Malignant neoplasm of nipple and areola, left female breast: Secondary | ICD-10-CM

## 2017-12-05 DIAGNOSIS — Z7689 Persons encountering health services in other specified circumstances: Secondary | ICD-10-CM | POA: Diagnosis not present

## 2017-12-05 DIAGNOSIS — Z853 Personal history of malignant neoplasm of breast: Secondary | ICD-10-CM | POA: Insufficient documentation

## 2017-12-05 LAB — CBC WITH DIFFERENTIAL (CANCER CENTER ONLY)
BASOS PCT: 0 %
Basophils Absolute: 0 10*3/uL (ref 0.0–0.1)
EOS ABS: 0 10*3/uL (ref 0.0–0.5)
EOS PCT: 0 %
HCT: 33.4 % — ABNORMAL LOW (ref 34.8–46.6)
Hemoglobin: 11.1 g/dL — ABNORMAL LOW (ref 11.6–15.9)
LYMPHS ABS: 1.8 10*3/uL (ref 0.9–3.3)
Lymphocytes Relative: 19 %
MCH: 26.4 pg (ref 25.1–34.0)
MCHC: 33.2 g/dL (ref 31.5–36.0)
MCV: 79.5 fL (ref 79.5–101.0)
MONO ABS: 0.7 10*3/uL (ref 0.1–0.9)
MONOS PCT: 7 %
NEUTROS PCT: 74 %
Neutro Abs: 6.8 10*3/uL — ABNORMAL HIGH (ref 1.5–6.5)
PLATELETS: 327 10*3/uL (ref 145–400)
RBC: 4.21 MIL/uL (ref 3.70–5.45)
RDW: 14 % (ref 11.2–14.5)
WBC Count: 9.3 10*3/uL (ref 3.9–10.3)

## 2017-12-05 LAB — CMP (CANCER CENTER ONLY)
ALK PHOS: 129 U/L (ref 40–150)
ALT: 53 U/L (ref 0–55)
AST: 21 U/L (ref 5–34)
Albumin: 3.7 g/dL (ref 3.5–5.0)
Anion gap: 8 (ref 3–11)
BUN: 12 mg/dL (ref 7–26)
CALCIUM: 9.2 mg/dL (ref 8.4–10.4)
CHLORIDE: 105 mmol/L (ref 98–109)
CO2: 25 mmol/L (ref 22–29)
CREATININE: 0.9 mg/dL (ref 0.60–1.10)
Glucose, Bld: 113 mg/dL (ref 70–140)
Potassium: 3.6 mmol/L (ref 3.5–5.1)
Sodium: 138 mmol/L (ref 136–145)
Total Protein: 7.1 g/dL (ref 6.4–8.3)

## 2017-12-05 MED ORDER — SODIUM CHLORIDE 0.9% FLUSH
10.0000 mL | INTRAVENOUS | Status: DC | PRN
  Administered 2017-12-05: 10 mL
  Filled 2017-12-05: qty 10

## 2017-12-05 MED ORDER — HEPARIN SOD (PORK) LOCK FLUSH 100 UNIT/ML IV SOLN
500.0000 [IU] | Freq: Once | INTRAVENOUS | Status: AC | PRN
  Administered 2017-12-05: 500 [IU]
  Filled 2017-12-05: qty 5

## 2017-12-05 MED ORDER — CARBOPLATIN CHEMO INJECTION 450 MG/45ML
280.0000 mg | Freq: Once | INTRAVENOUS | Status: AC
  Administered 2017-12-05: 280 mg via INTRAVENOUS
  Filled 2017-12-05: qty 28

## 2017-12-05 MED ORDER — SODIUM CHLORIDE 0.9% FLUSH
10.0000 mL | Freq: Once | INTRAVENOUS | Status: AC
  Administered 2017-12-05: 10 mL
  Filled 2017-12-05: qty 10

## 2017-12-05 MED ORDER — PALONOSETRON HCL INJECTION 0.25 MG/5ML
INTRAVENOUS | Status: AC
  Filled 2017-12-05: qty 5

## 2017-12-05 MED ORDER — SODIUM CHLORIDE 0.9 % IV SOLN
Freq: Once | INTRAVENOUS | Status: AC
  Administered 2017-12-05: 14:00:00 via INTRAVENOUS

## 2017-12-05 MED ORDER — DEXAMETHASONE SODIUM PHOSPHATE 10 MG/ML IJ SOLN
INTRAMUSCULAR | Status: AC
  Filled 2017-12-05: qty 1

## 2017-12-05 MED ORDER — GEMCITABINE HCL CHEMO INJECTION 1 GM/26.3ML
1000.0000 mg/m2 | Freq: Once | INTRAVENOUS | Status: AC
  Administered 2017-12-05: 2128 mg via INTRAVENOUS
  Filled 2017-12-05: qty 55.97

## 2017-12-05 MED ORDER — PALONOSETRON HCL INJECTION 0.25 MG/5ML
0.2500 mg | Freq: Once | INTRAVENOUS | Status: AC
  Administered 2017-12-05: 0.25 mg via INTRAVENOUS

## 2017-12-05 MED ORDER — DEXAMETHASONE SODIUM PHOSPHATE 10 MG/ML IJ SOLN
10.0000 mg | Freq: Once | INTRAMUSCULAR | Status: AC
  Administered 2017-12-05: 10 mg via INTRAVENOUS

## 2017-12-05 NOTE — Progress Notes (Signed)
Patient Care Team: Patient, [name] as PCP - General ([place]) Neldon Mc, MD as Surgeon (General Surgery) Everardo All, MD (Hematology and Oncology)  DIAGNOSIS:  Encounter Diagnosis  Name Primary?  . 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, unspecified estrogen receptor status ([place])     SUMMARY OF ONCOLOGIC HISTORY:   Breast cancer, IDC, Left, Stage III, Triple negative   06/30/2011 Initial Diagnosis    Breast cancer, IDC, Left, Stage III, Triple negative, 7.6 cm breast mass and palpable axillary mass      09/06/2011 Miscellaneous    BRCA 1 and 2: Negative      09/14/2011 - 11/23/2011 Neo-Adjuvant Chemotherapy    Dose dense FEC followed by dose dense Taxotere      12/21/2011 Surgery    Left lumpectomy: High-grade poorly differentiated IDC 1.7 cm with high-grade DCIS, margins negative, 3/7 lymph nodes positive, ER 0%, PR 0%, HER-2 negative ratio 1.33 T1CN1 stage IIb      12/31/2011 - 02/15/2012 Radiation Therapy    Radiation at [facility]      09/26/2017 Relapse/Recurrence    Left breast upper outer quadrant within the lumpectomy bed: Fibrosis no malignancy, right axillary lymph node biopsy metastatic high-grade carcinoma ER 0%, PR 0%, Ki-67 90%, HER-2 negative ratio 1.11 (similar to previous ductal carcinoma)      11/14/2017 -  Chemotherapy    Adjuvant chemotherapy with Gemzar and carboplatin days 1 and 8 every 3 weeks       CHIEF COMPLIANT: Cycle 2-day 1 carboplatin and gemcitabine  INTERVAL HISTORY: Jessica Simpson is a 64-year with above-mentioned history left breast cancer was treated with lumpectomy but then developed recurrence which was in the right axillary lymph node and biopsy proven to be high-grade carcinoma that was triple negative disease.  She is currently on adjuvant chemotherapy with carboplatin and gemcitabine.  Today is cycle 2-day 1.  Last couple weeks she had felt much better.  Energy levels have recovered.  Denies any  nausea vomiting.  Constipation is also improved.  She denies any fevers or chills.  REVIEW OF SYSTEMS:   Constitutional: Denies fevers, chills or abnormal weight loss Eyes: Denies blurriness of vision Ears, nose, mouth, throat, and face: Denies mucositis or sore throat Respiratory: Denies cough, dyspnea or wheezes Cardiovascular: Denies palpitation, chest discomfort Gastrointestinal:  Denies nausea, heartburn or change in bowel habits Skin: Denies abnormal skin rashes Lymphatics: Denies new lymphadenopathy or easy bruising Neurological:Denies numbness, tingling or new weaknesses Behavioral/Psych: Mood is stable, no new changes  Extremities: No lower extremity edema Breast:  denies any pain or lumps or nodules in either breasts All other systems were reviewed with the patient and are negative.  I have reviewed the past medical history, past surgical history, social history and family history with the patient and they are unchanged from previous note.  ALLERGIES:  is allergic to codeine and lortab [hydrocodone-acetaminophen].  MEDICATIONS:  Current Outpatient Medications  Medication Sig Dispense Refill  . acetaminophen (TYLENOL) 500 MG tablet Take 500-1,000 mg by mouth every 6 (six) hours as needed. For pain    . amLODipine (NORVASC) 5 MG tablet Take 5 mg by mouth daily.  6  . Cholecalciferol 5000 units capsule Take 1 capsule (5,000 Units total) by mouth daily.    Marland Kitchen lidocaine-prilocaine (EMLA) cream Apply to affected area once 30 g 3  . loratadine (CLARITIN) 10 MG tablet Take 10 mg by mouth daily as needed (for seasonal allergies (Spring)).    Marland Kitchen  LORazepam (ATIVAN) 0.5 MG tablet Take 1 tablet (0.5 mg total) by mouth at bedtime as needed for sleep. 30 tablet 0  . ondansetron (ZOFRAN) 8 MG tablet Take 1 tablet (8 mg total) by mouth 2 (two) times daily as needed for refractory nausea / vomiting. Start on day 3 after chemotherapy. 30 tablet 1  . prochlorperazine (COMPAZINE) 10 MG tablet Take  1 tablet (10 mg total) by mouth every 6 (six) hours as needed (Nausea or vomiting). 30 tablet 1  . traMADol (ULTRAM) 50 MG tablet Take 2 tablets (100 mg total) by mouth every 6 (six) hours as needed. 10 tablet 0   No current facility-administered medications for this visit.    Facility-Administered Medications Ordered in Other Visits  Medication Dose Route Frequency Provider Last Rate Last Dose  . 0.9 %  sodium chloride infusion   Intravenous Once Nicholas Lose, MD      . CARBOplatin (PARAPLATIN) 280 mg in sodium chloride 0.9 % 250 mL chemo infusion  280 mg Intravenous Once Nicholas Lose, MD      . dexamethasone (DECADRON) injection 10 mg  10 mg Intravenous Once Nicholas Lose, MD      . gemcitabine (GEMZAR) 2,128 mg in sodium chloride 0.9 % 250 mL chemo infusion  1,000 mg/m2 (Treatment Plan Recorded) Intravenous Once Nicholas Lose, MD      . heparin lock flush 100 unit/mL  500 Units Intracatheter Once PRN Nicholas Lose, MD      . lactated ringers infusion    Continuous PRN Kerby Less, CRNA      . palonosetron (ALOXI) injection 0.25 mg  0.25 mg Intravenous Once Nicholas Lose, MD      . sodium chloride flush (NS) 0.9 % injection 10 mL  10 mL Intracatheter PRN Nicholas Lose, MD        PHYSICAL EXAMINATION: ECOG PERFORMANCE STATUS: 1 - Symptomatic but completely ambulatory  Vitals:   12/05/17 1157  BP: (!) 148/94  Pulse: 74  Resp: 18  Temp: 97.6 F (36.4 C)  SpO2: 100%   Filed Weights   12/05/17 1157  Weight: 217 lb 6.4 oz (98.6 kg)    GENERAL:alert, no distress and comfortable SKIN: skin color, texture, turgor are normal, no rashes or significant lesions EYES: normal, Conjunctiva are pink and non-injected, sclera clear OROPHARYNX:no exudate, no erythema and lips, buccal mucosa, and tongue normal  NECK: supple, thyroid normal size, non-tender, without nodularity LYMPH:  no palpable lymphadenopathy in the cervical, axillary or inguinal LUNGS: clear to auscultation and  percussion with normal breathing effort HEART: regular rate & rhythm and no murmurs and no lower extremity edema ABDOMEN:abdomen soft, non-tender and normal bowel sounds MUSCULOSKELETAL:no cyanosis of digits and no clubbing  NEURO: alert & oriented x 3 with fluent speech, no focal motor/sensory deficits EXTREMITIES: No lower extremity edema  LABORATORY DATA:  I have reviewed the data as listed CMP Latest Ref Rng & Units 12/05/2017 11/21/2017 11/09/2017  Glucose 70 - 140 mg/dL 113 121 120(H)  BUN 7 - 26 mg/dL _0 Creatinine 0.60 - 1.10 mg/dL 0.90 0.95 0.92  Sodium 136 - 145 mmol/L 138 139 138  Potassium 3.5 - 5.1 mmol/L 3.6 3.6 3.5  Chloride 98 - 109 mmol/L 105 105 106  CO2 22 - 29 mmol/L _1 Calcium 8.4 - 10.4 mg/dL 9.2 8.9 9.4  Total Protein 6.4 - 8.3 g/dL 7.1 7.2 -  Total Bilirubin 0.2 - 1.2 mg/dL <0.2(L) 0.3 -  Alkaline Phos 40 -  150 U/L 129 90 -  AST 5 - 34 U/L 21 14 -  ALT 0 - 55 U/L 53 25 -    Lab Results  Component Value Date   WBC 9.3 12/05/2017   HGB 12.6 11/09/2017   HCT 33.4 (L) 12/05/2017   MCV 79.5 12/05/2017   PLT 327 12/05/2017   NEUTROABS 6.8 (H) 12/05/2017    ASSESSMENT & PLAN:  Breast cancer, IDC, Left, Stage III, Triple negative 06/30/2011:Breast cancer, IDC, Left, Stage III, Triple negative, 7.6 cm breast mass and palpable axillary mass November 2012 to January 2013: Neoadjuvant chemotherapy with dose dense FEC followed by dose dense Taxotere 12/21/2011: Left lumpectomy: High-grade poorly differentiated IDC 1.7 cm with high-grade DCIS, margins negative, 3/7 lymph nodes positive, ER 0%, PR 0%, HER-2 negative ratio 1.33 T1CN1 stage IIb  RELAPSE 09/26/2017: Left breast upper outer quadrant within the lumpectomy bed: Fibrosis no malignancy, right axillary lymph node biopsy metastatic high-grade carcinoma ER 0%, PR 0%, Ki-67 90%, HER-2 negative ratio 1.11 (similar to previous ductal carcinoma)  Current treatment: Carboplatin and gemcitabine days 1 and  8 every 3 weeks x6 cycles adjuvant therapy. Today is cycle 2 day 1  Chemo toxicities: 1.  Fatigue 2. Nausea 3.  Constipation   Return to clinic in 3 week for cycle 3   I spent 25 minutes talking to the patient of which more than half was spent in counseling and coordination of care.  No orders of the defined types were placed in this encounter.  The patient has a good understanding of the overall plan. she agrees with it. she will call with any problems that may develop before the next visit here.   Harriette Ohara, MD 12/05/17

## 2017-12-05 NOTE — Assessment & Plan Note (Signed)
06/30/2011:Breast cancer, IDC, Left, Stage III, Triple negative, 7.6 cm breast mass and palpable axillary mass November 2012 to January 2013: Neoadjuvant chemotherapy with dose dense FEC followed by dose dense Taxotere 12/21/2011: Left lumpectomy: High-grade poorly differentiated IDC 1.7 cm with high-grade DCIS, margins negative, 3/7 lymph nodes positive, ER 0%, PR 0%, HER-2 negative ratio 1.33 T1CN1 stage IIb  RELAPSE 09/26/2017: Left breast upper outer quadrant within the lumpectomy bed: Fibrosis no malignancy, right axillary lymph node biopsy metastatic high-grade carcinoma ER 0%, PR 0%, Ki-67 90%, HER-2 negative ratio 1.11 (similar to previous ductal carcinoma)  Current treatment: Carboplatin and gemcitabine days 1 and 8 every 3 weeks x6 cycles adjuvant therapy. Today is cycle 2 day 1  Chemo toxicities: 1.  Fatigue 2. Nausea 3.  Constipation   Return to clinic in 3 week for cycle 3

## 2017-12-05 NOTE — Patient Instructions (Signed)
[facility] Discharge Instructions for Patients Receiving Chemotherapy  Today you received the following chemotherapy agents :  Gemcitabine, Carboplatin.  To help prevent nausea and vomiting after your treatment, we encourage you to take your nausea medication as prescribed.   If you develop nausea and vomiting that is not controlled by your nausea medication, call the clinic.   BELOW ARE SYMPTOMS THAT SHOULD BE REPORTED IMMEDIATELY:  *FEVER GREATER THAN 100.5 F  *CHILLS WITH OR WITHOUT FEVER  NAUSEA AND VOMITING THAT IS NOT CONTROLLED WITH YOUR NAUSEA MEDICATION  *UNUSUAL SHORTNESS OF BREATH  *UNUSUAL BRUISING OR BLEEDING  TENDERNESS IN MOUTH AND THROAT WITH OR WITHOUT PRESENCE OF ULCERS  *URINARY PROBLEMS  *BOWEL PROBLEMS  UNUSUAL RASH Items with * indicate a potential emergency and should be followed up as soon as possible.  Feel free to call the clinic should you have any questions or concerns. The clinic phone number is [phone].  Please show the CHEMO ALERT CARD at check-in to the Emergency Department and triage nurse.   

## 2017-12-07 ENCOUNTER — Telehealth: Payer: Self-pay

## 2017-12-07 ENCOUNTER — Ambulatory Visit: Payer: BLUE CROSS/BLUE SHIELD

## 2017-12-07 ENCOUNTER — Other Ambulatory Visit: Payer: Self-pay | Admitting: Hematology and Oncology

## 2017-12-07 NOTE — Telephone Encounter (Signed)
Called pt and left VM to inform her there was a scheduling error and she is not to have an injection today.  Cyndia Bent RN

## 2017-12-12 ENCOUNTER — Inpatient Hospital Stay: Payer: BLUE CROSS/BLUE SHIELD

## 2017-12-12 ENCOUNTER — Other Ambulatory Visit: Payer: Self-pay | Admitting: Hematology and Oncology

## 2017-12-12 VITALS — BP 140/83 | HR 78 | Temp 98.6°F | Resp 18

## 2017-12-12 DIAGNOSIS — N6331 Unspecified lump in axillary tail of the right breast: Secondary | ICD-10-CM

## 2017-12-12 DIAGNOSIS — C50012 Malignant neoplasm of nipple and areola, left female breast: Secondary | ICD-10-CM | POA: Diagnosis not present

## 2017-12-12 LAB — CMP (CANCER CENTER ONLY)
ALBUMIN: 3.8 g/dL (ref 3.5–5.0)
ALT: 83 U/L — AB (ref 0–55)
AST: 28 U/L (ref 5–34)
Alkaline Phosphatase: 100 U/L (ref 40–150)
Anion gap: 8 (ref 3–11)
BILIRUBIN TOTAL: 0.3 mg/dL (ref 0.2–1.2)
BUN: 14 mg/dL (ref 7–26)
CHLORIDE: 105 mmol/L (ref 98–109)
CO2: 25 mmol/L (ref 22–29)
CREATININE: 0.9 mg/dL (ref 0.60–1.10)
Calcium: 9.6 mg/dL (ref 8.4–10.4)
GFR, Est AFR Am: >60 mL/min (ref >60)
GFR, Estimated: >60 mL/min (ref >60)
GLUCOSE: 93 mg/dL (ref 70–140)
POTASSIUM: 3.8 mmol/L (ref 3.5–5.1)
Sodium: 138 mmol/L (ref 136–145)
Total Protein: 7.4 g/dL (ref 6.4–8.3)

## 2017-12-12 LAB — CBC WITH DIFFERENTIAL (CANCER CENTER ONLY)
BASOS ABS: 0.1 10*3/uL (ref 0.0–0.1)
BASOS PCT: 3 %
Eosinophils Absolute: 0 10*3/uL (ref 0.0–0.5)
Eosinophils Relative: 0 %
HEMATOCRIT: 31.8 % — AB (ref 34.8–46.6)
HEMOGLOBIN: 10.4 g/dL — AB (ref 11.6–15.9)
Lymphocytes Relative: 62 %
Lymphs Abs: 1.7 10*3/uL (ref 0.9–3.3)
MCH: 26.1 pg (ref 25.1–34.0)
MCHC: 32.8 g/dL (ref 31.5–36.0)
MCV: 79.5 fL (ref 79.5–101.0)
Monocytes Absolute: 0.2 10*3/uL (ref 0.1–0.9)
Monocytes Relative: 8 %
NEUTROS ABS: 0.7 10*3/uL — AB (ref 1.5–6.5)
NEUTROS PCT: 27 %
Platelet Count: 444 10*3/uL — ABNORMAL HIGH (ref 145–400)
RBC: 4 MIL/uL (ref 3.70–5.45)
RDW: 14.3 % (ref 11.2–14.5)
WBC: 2.7 10*3/uL — AB (ref 3.9–10.3)

## 2017-12-12 MED ORDER — PALONOSETRON HCL INJECTION 0.25 MG/5ML
INTRAVENOUS | Status: AC
  Filled 2017-12-12: qty 5

## 2017-12-12 MED ORDER — SODIUM CHLORIDE 0.9% FLUSH
10.0000 mL | INTRAVENOUS | Status: DC | PRN
  Administered 2017-12-12: 10 mL
  Filled 2017-12-12: qty 10

## 2017-12-12 MED ORDER — DEXAMETHASONE SODIUM PHOSPHATE 10 MG/ML IJ SOLN
10.0000 mg | Freq: Once | INTRAMUSCULAR | Status: AC
  Administered 2017-12-12: 10 mg via INTRAVENOUS

## 2017-12-12 MED ORDER — SODIUM CHLORIDE 0.9 % IV SOLN
Freq: Once | INTRAVENOUS | Status: AC
  Administered 2017-12-12: 12:00:00 via INTRAVENOUS

## 2017-12-12 MED ORDER — HEPARIN SOD (PORK) LOCK FLUSH 100 UNIT/ML IV SOLN
500.0000 [IU] | Freq: Once | INTRAVENOUS | Status: AC | PRN
  Administered 2017-12-12: 500 [IU]
  Filled 2017-12-12: qty 5

## 2017-12-12 MED ORDER — DEXAMETHASONE SODIUM PHOSPHATE 10 MG/ML IJ SOLN
INTRAMUSCULAR | Status: AC
  Filled 2017-12-12: qty 1

## 2017-12-12 MED ORDER — PALONOSETRON HCL INJECTION 0.25 MG/5ML
0.2500 mg | Freq: Once | INTRAVENOUS | Status: AC
  Administered 2017-12-12: 0.25 mg via INTRAVENOUS

## 2017-12-12 MED ORDER — SODIUM CHLORIDE 0.9 % IV SOLN
800.0000 mg/m2 | Freq: Once | INTRAVENOUS | Status: AC
  Administered 2017-12-12: 1672 mg via INTRAVENOUS
  Filled 2017-12-12: qty 43.97

## 2017-12-12 MED ORDER — CARBOPLATIN CHEMO INJECTION 450 MG/45ML
289.4000 mg | Freq: Once | INTRAVENOUS | Status: AC
  Administered 2017-12-12: 290 mg via INTRAVENOUS
  Filled 2017-12-12: qty 29

## 2017-12-12 NOTE — Progress Notes (Signed)
Per Dr. Lindi Adie ok to treat with ANC of 0.7 and ALT of 83. Her dose will be reduced today.

## 2017-12-12 NOTE — Patient Instructions (Signed)
[facility] Discharge Instructions for Patients Receiving Chemotherapy  Today you received the following chemotherapy agents :  Gemcitabine, Carboplatin.  To help prevent nausea and vomiting after your treatment, we encourage you to take your nausea medication as prescribed.   If you develop nausea and vomiting that is not controlled by your nausea medication, call the clinic.   BELOW ARE SYMPTOMS THAT SHOULD BE REPORTED IMMEDIATELY:  *FEVER GREATER THAN 100.5 F  *CHILLS WITH OR WITHOUT FEVER  NAUSEA AND VOMITING THAT IS NOT CONTROLLED WITH YOUR NAUSEA MEDICATION  *UNUSUAL SHORTNESS OF BREATH  *UNUSUAL BRUISING OR BLEEDING  TENDERNESS IN MOUTH AND THROAT WITH OR WITHOUT PRESENCE OF ULCERS  *URINARY PROBLEMS  *BOWEL PROBLEMS  UNUSUAL RASH Items with * indicate a potential emergency and should be followed up as soon as possible.  Feel free to call the clinic should you have any questions or concerns. The clinic phone number is [phone].  Please show the CHEMO ALERT CARD at check-in to the Emergency Department and triage nurse.   

## 2017-12-14 ENCOUNTER — Inpatient Hospital Stay: Payer: BLUE CROSS/BLUE SHIELD

## 2017-12-14 VITALS — BP 151/76 | HR 88 | Temp 98.0°F | Resp 18

## 2017-12-14 DIAGNOSIS — N6331 Unspecified lump in axillary tail of the right breast: Secondary | ICD-10-CM

## 2017-12-14 DIAGNOSIS — C50012 Malignant neoplasm of nipple and areola, left female breast: Secondary | ICD-10-CM | POA: Diagnosis not present

## 2017-12-14 MED ORDER — PEGFILGRASTIM-CBQV 6 MG/0.6ML ~~LOC~~ SOSY
6.0000 mg | PREFILLED_SYRINGE | Freq: Once | SUBCUTANEOUS | Status: AC
  Administered 2017-12-14: 6 mg via SUBCUTANEOUS
  Filled 2017-12-14: qty 0.6

## 2017-12-19 ENCOUNTER — Encounter: Payer: Self-pay | Admitting: Genetic Counselor

## 2017-12-19 ENCOUNTER — Inpatient Hospital Stay (HOSPITAL_BASED_OUTPATIENT_CLINIC_OR_DEPARTMENT_OTHER): Payer: BLUE CROSS/BLUE SHIELD | Admitting: Genetic Counselor

## 2017-12-19 ENCOUNTER — Inpatient Hospital Stay: Payer: BLUE CROSS/BLUE SHIELD

## 2017-12-19 DIAGNOSIS — Z801 Family history of malignant neoplasm of trachea, bronchus and lung: Secondary | ICD-10-CM

## 2017-12-19 DIAGNOSIS — Z171 Estrogen receptor negative status [ER-]: Secondary | ICD-10-CM | POA: Diagnosis not present

## 2017-12-19 DIAGNOSIS — Z803 Family history of malignant neoplasm of breast: Secondary | ICD-10-CM | POA: Insufficient documentation

## 2017-12-19 DIAGNOSIS — Z853 Personal history of malignant neoplasm of breast: Secondary | ICD-10-CM | POA: Diagnosis not present

## 2017-12-19 DIAGNOSIS — C50012 Malignant neoplasm of nipple and areola, left female breast: Secondary | ICD-10-CM | POA: Diagnosis not present

## 2017-12-19 DIAGNOSIS — N6331 Unspecified lump in axillary tail of the right breast: Secondary | ICD-10-CM

## 2017-12-19 DIAGNOSIS — Z7183 Encounter for nonprocreative genetic counseling: Secondary | ICD-10-CM | POA: Diagnosis not present

## 2017-12-19 LAB — CBC WITH DIFFERENTIAL (CANCER CENTER ONLY)
Basophils Absolute: 0.2 10*3/uL — ABNORMAL HIGH (ref 0.0–0.1)
Basophils Relative: 1 %
EOS PCT: 0 %
Eosinophils Absolute: 0 10*3/uL (ref 0.0–0.5)
HCT: 31.3 % — ABNORMAL LOW (ref 34.8–46.6)
Hemoglobin: 10.2 g/dL — ABNORMAL LOW (ref 11.6–15.9)
LYMPHS ABS: 2.5 10*3/uL (ref 0.9–3.3)
LYMPHS PCT: 8 %
MCH: 25.9 pg (ref 25.1–34.0)
MCHC: 32.7 g/dL (ref 31.5–36.0)
MCV: 79.3 fL — AB (ref 79.5–101.0)
Monocytes Absolute: 1 10*3/uL — ABNORMAL HIGH (ref 0.1–0.9)
Monocytes Relative: 3 %
Neutro Abs: 26.8 10*3/uL — ABNORMAL HIGH (ref 1.5–6.5)
Neutrophils Relative %: 88 %
PLATELETS: 152 10*3/uL (ref 145–400)
RBC: 3.95 MIL/uL (ref 3.70–5.45)
RDW: 13.9 % (ref 11.2–14.5)
WBC: 30.5 10*3/uL — AB (ref 3.9–10.3)

## 2017-12-19 LAB — CMP (CANCER CENTER ONLY)
ALT: 63 U/L — ABNORMAL HIGH (ref 0–55)
AST: 25 U/L (ref 5–34)
Albumin: 3.9 g/dL (ref 3.5–5.0)
Alkaline Phosphatase: 208 U/L — ABNORMAL HIGH (ref 40–150)
Anion gap: 10 (ref 3–11)
BUN: 12 mg/dL (ref 7–26)
CHLORIDE: 105 mmol/L (ref 98–109)
CO2: 27 mmol/L (ref 22–29)
Calcium: 9.9 mg/dL (ref 8.4–10.4)
Creatinine: 1.02 mg/dL (ref 0.60–1.10)
GFR, Est AFR Am: >60 mL/min (ref >60)
Glucose, Bld: 98 mg/dL (ref 70–140)
Potassium: 3.6 mmol/L (ref 3.5–5.1)
Sodium: 142 mmol/L (ref 136–145)
Total Bilirubin: <0.2 mg/dL — ABNORMAL LOW (ref 0.2–1.2)
Total Protein: 7.5 g/dL (ref 6.4–8.3)

## 2017-12-19 NOTE — Progress Notes (Signed)
REFERRING PROVIDER: Rolm Bookbinder, MD [address] [place], [place] [address]  PRIMARY PROVIDER:  Patient, [name]  PRIMARY REASON FOR VISIT:  1. 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 Family history of breast cancer   3. History of breast cancer      HISTORY OF PRESENT ILLNESS:   Ms. Borrayo, a 47 y.o. female, was seen for a [employer] cancer genetics consultation at the request of Dr. Donne Hazel due to a personal and family history of cancer.  Ms. Salahuddin presents to clinic today to discuss the possibility of a hereditary predisposition to cancer, genetic testing, and to further clarify her future cancer risks, as well as potential cancer risks for family members.   In 2013, at the age of 46, Ms. Rutt was diagnosed with cancer of the left breast. The tumor was triple negative. This was treated with chemotherapy, lumpectomy and radiation. She had genetic testing at that time for BRCA and BART which is reportedly negative.  In December 2018, Ms. Dickerman was diagnosed with cancer of the Left breast.  She is currently on chemotherapy again.   CANCER HISTORY:    Breast cancer, IDC, Left, Stage III, Triple negative   06/30/2011 Initial Diagnosis    Breast cancer, IDC, Left, Stage III, Triple negative, 7.6 cm breast mass and palpable axillary mass      09/06/2011 Miscellaneous    BRCA 1 and 2: Negative      09/14/2011 - 11/23/2011 Neo-Adjuvant Chemotherapy    Dose dense FEC followed by dose dense Taxotere      12/21/2011 Surgery    Left lumpectomy: High-grade poorly differentiated IDC 1.7 cm with high-grade DCIS, margins negative, 3/7 lymph nodes positive, ER 0%, PR 0%, HER-2 negative ratio 1.33 T1CN1 stage IIb      12/31/2011 - 02/15/2012 Radiation Therapy    Radiation at [facility]      09/26/2017 Relapse/Recurrence    Left breast upper outer quadrant within the lumpectomy bed: Fibrosis no malignancy,  right axillary lymph node biopsy metastatic high-grade carcinoma ER 0%, PR 0%, Ki-67 90%, HER-2 negative ratio 1.11 (similar to previous ductal carcinoma)      11/14/2017 -  Chemotherapy    Adjuvant chemotherapy with Gemzar and carboplatin days 1 and 8 every 3 weeks        HORMONAL RISK FACTORS:  Menarche was at age 72.  First live birth at age 41.  OCP use for approximately 20+ years.  Ovaries intact: yes.  Hysterectomy: no.  Menopausal status: premenopausal.  HRT use: 0 years. Colonoscopy: yes; normal. Mammogram within the last year: yes. Number of breast biopsies: 2. Up to date with pelvic exams:  yes. Any excessive radiation exposure in the past:  no  Past Medical History:  Diagnosis Date  . BRCA1 negative 10/22/2011  . BRCA2 negative 10/22/2011  . Breast cancer, IDC, Left, Stage III, Triple negative 06/30/2011  . Contraceptive education 01/15/2016  . Family history of breast cancer   . Fracture    right 4th toe  . History of breast cancer 07/22/2014  . Hypertension   . Lymphedema 06/15/2012  . Lymphedema of arm   . Personal history of chemotherapy   . Personal history of radiation therapy   . Positive fecal occult blood test 07/22/2014  . S/P radiation therapy 2013   50 gray in 25 fractions to the left breast, supraclavicular, and axillary regions. She then received a boost  to the left lumpectomy of 10 gray in 5 fractions. This was given at [facility].  . Status post chemotherapy Comp. 11/23/11   FEC and Taxotere    Past Surgical History:  Procedure Laterality Date  . 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 BREAST LUMPECTOMY    . BREAST SURGERY    . CESAREAN SECTION    . COLONOSCOPY N/A 08/14/2014   Procedure: COLONOSCOPY;  Surgeon: Rogene Houston, MD;  [facility];  Service: Endoscopy;  Laterality: N/A;  930  . EVACUATION BREAST HEMATOMA  12/21/2011   Procedure: EVACUATION HEMATOMA BREAST;   Surgeon: Stark Klein, MD;  Location: [facility];  Service: General;  Laterality: Left;  . HAND SURGERY     tumor on finger, left hand  . INCISION AND DRAINAGE ABSCESS ANAL     x3  . left breast biopsy with axillary biopsy     core bx  . PORT-A-CATH REMOVAL  12/21/2011   Procedure: REMOVAL PORT-A-CATH;  Surgeon: Haywood Lasso, MD;  Location: [facility];  Service: General;  Laterality: N/A;  . PORTACATH PLACEMENT  08/02/2011   dr Margot Chimes  . PORTACATH PLACEMENT Right 11/14/2017   Procedure: INSERTION PORT-A-CATH WITH [place];  Surgeon: Rolm Bookbinder, MD;  Location: [facility];  Service: General;  Laterality: Right;  . removal breast mass  2004   benign  . TREATMENT FISTULA ANAL    . TUMOR REMOVAL     on left hand  . TUMOR REMOVAL     rt. eye    Social History   Socioeconomic History  . Marital status: Single    Spouse name: Not on file  . Number of children: Not on file  . Years of education: Not on file  . Highest education level: Not on file  Social Needs  . Financial resource strain: Not on file  . Food insecurity - worry: Not on file  . Food insecurity - inability: Not on file  . Transportation needs - medical: Not on file  . Transportation needs - non-medical: Not on file  Occupational History  . Not on file  Tobacco Use  . Smoking status: Never Smoker  . Smokeless tobacco: Never Used  Substance and Sexual Activity  . Alcohol use: No  . Drug use: No  . Sexual activity: Yes    Birth control/protection: None  Other Topics Concern  . Not on file  Social History Narrative  . Not on file     FAMILY HISTORY:  We obtained a detailed, 4-generation family history.  Significant diagnoses are listed below: Family History  Problem Relation Age of Onset  . Cancer Father 17       lung cancer   . Cancer Maternal Grandmother        breast  . Cancer Maternal Grandfather   . Breast cancer Other        MGMs mother  . Colon cancer Neg Hx     The patient has one  daughter who is cancer free.  She has a maternal half brother and sister who are cancer free.  Her father is deceased from lung cancer and her mother is alive.  The patient's father was a captain in [employer] and developed lung cancer.  It is thought his cancer was from exposures on the ship.  Nothing is known about his family.  The patient's mother had four brothers, none had cancer.  The maternal grandmother and her mother both had breast cancer.  The maternal grandfather died of an unknown cancer.  Reportedly there are several individuals on his side of the family with cancer.  Ms. Hepburn is unaware of previous family history of genetic testing for hereditary cancer risks. Patient's maternal ancestors are of African American descent, and paternal ancestors are of African American descent. There is no reported Ashkenazi Jewish ancestry. There is no known consanguinity.  GENETIC COUNSELING ASSESSMENT: JAYLYNN MCALEER is a 47 y.o. female with a personal and family history of breast cancer which is somewhat suggestive of a hereditary cancer syndrome and predisposition to cancer. We, therefore, discussed and recommended the following at today's visit.   DISCUSSION: We discussed that about 5-10% of breast cancer is hereditary with most cases due to BRCA mutation.  The patient tested negative for BRCA mutations in the past, so the most likely situation is that she will be negative again.  However, there are other genes that we can now test for that can also increase the risk for breast cancer.  The most common of these include ATM, CHEK2 and PALB2.  We reviewed the characteristics, features and inheritance patterns of hereditary cancer syndromes. We also discussed genetic testing, including the appropriate family members to test, the process of testing, insurance coverage and turn-around-time for results. We discussed the implications of a negative, positive and/or variant of uncertain significant result.  We recommended Ms. Shehan pursue genetic testing for the common hereditary cancer gene panel. The Hereditary Gene Panel offered by [facility] includes sequencing and/or deletion duplication testing of the following 47 genes: APC, ATM, AXIN2, BARD1, BMPR1A, BRCA1, BRCA2, BRIP1, CDH1, CDK4, CDKN2A (p14ARF), CDKN2A (p16INK4a), CHEK2, CTNNA1, DICER1, EPCAM (Deletion/duplication testing only), GREM1 (promoter region deletion/duplication testing only), KIT, MEN1, MLH1, MSH2, MSH3, MSH6, MUTYH, NBN, NF1, NHTL1, PALB2, PDGFRA, PMS2, POLD1, POLE, PTEN, RAD50, RAD51C, RAD51D, SDHB, SDHC, SDHD, SMAD4, SMARCA4. STK11, TP53, TSC1, TSC2, and VHL.  The following genes were evaluated for sequence changes only: SDHA and HOXB13 c.251G>A variant only.   Based on Ms. Hippler's personal and family history of cancer, she meets medical criteria for genetic testing. Despite that she meets criteria, she may still have an out of pocket cost. We discussed that if her out of pocket cost for testing is over $100, the laboratory will call and confirm whether she wants to proceed with testing.  If the out of pocket cost of testing is less than $100 she will be billed by the genetic testing laboratory.   PLAN: After considering the risks, benefits, and limitations, Ms. Rosete  provided informed consent to pursue genetic testing and the blood sample was sent to [facility] for analysis of the common hereditary cancer panel. Results should be available within approximately 2-3 weeks' time, at which point they will be disclosed by telephone to Ms. Borgwardt, as will any additional recommendations warranted by these results. Ms. Vanderploeg will receive a summary of her genetic counseling visit and a copy of her results once available. This information will also be available in Epic. We encouraged Ms. Oxendine to remain in contact with cancer genetics annually so that we can continuously update the family history and inform her of any  changes in cancer genetics and testing that may be of benefit for her family. Ms. Dehnert questions were answered to her satisfaction today. Our contact information was provided should additional questions or concerns arise.  Lastly, we encouraged Ms. Bellevue to remain in contact with cancer  genetics annually so that we can continuously update the family history and inform her of any changes in cancer genetics and testing that may be of benefit for this family.   Ms.  Pollman questions were answered to her satisfaction today. Our contact information was provided should additional questions or concerns arise. Thank you for the referral and allowing [place] to share in the care of your patient.   Karen P. Florene Glen, [place], [facility] Certified Genetic Counselor [email or web address] phone: [phone]  The patient was seen for a total of 45 minutes in face-to-face genetic counseling.  This patient was discussed with Drs. Magrinat, Lindi Adie and/or Burr Medico who agrees with the above.    _______________________________________________________________________ For Office Staff:  Number of people involved in session: 2 Was an Intern/ student involved with case: no

## 2017-12-26 ENCOUNTER — Inpatient Hospital Stay: Payer: BLUE CROSS/BLUE SHIELD | Attending: Hematology and Oncology

## 2017-12-26 ENCOUNTER — Telehealth: Payer: Self-pay | Admitting: Hematology and Oncology

## 2017-12-26 ENCOUNTER — Inpatient Hospital Stay (HOSPITAL_BASED_OUTPATIENT_CLINIC_OR_DEPARTMENT_OTHER): Payer: BLUE CROSS/BLUE SHIELD | Admitting: Hematology and Oncology

## 2017-12-26 ENCOUNTER — Encounter: Payer: Self-pay | Admitting: *Deleted

## 2017-12-26 ENCOUNTER — Inpatient Hospital Stay: Payer: BLUE CROSS/BLUE SHIELD

## 2017-12-26 VITALS — BP 154/87

## 2017-12-26 DIAGNOSIS — C50012 Malignant neoplasm of nipple and areola, left female breast: Secondary | ICD-10-CM

## 2017-12-26 DIAGNOSIS — Z171 Estrogen receptor negative status [ER-]: Secondary | ICD-10-CM | POA: Insufficient documentation

## 2017-12-26 DIAGNOSIS — Z853 Personal history of malignant neoplasm of breast: Secondary | ICD-10-CM | POA: Insufficient documentation

## 2017-12-26 DIAGNOSIS — R5383 Other fatigue: Secondary | ICD-10-CM

## 2017-12-26 DIAGNOSIS — C50412 Malignant neoplasm of upper-outer quadrant of left female breast: Secondary | ICD-10-CM | POA: Diagnosis present

## 2017-12-26 DIAGNOSIS — Z7689 Persons encountering health services in other specified circumstances: Secondary | ICD-10-CM | POA: Insufficient documentation

## 2017-12-26 DIAGNOSIS — Z5111 Encounter for antineoplastic chemotherapy: Secondary | ICD-10-CM | POA: Insufficient documentation

## 2017-12-26 DIAGNOSIS — R110 Nausea: Secondary | ICD-10-CM | POA: Insufficient documentation

## 2017-12-26 DIAGNOSIS — N6331 Unspecified lump in axillary tail of the right breast: Secondary | ICD-10-CM

## 2017-12-26 DIAGNOSIS — Z95828 Presence of other vascular implants and grafts: Secondary | ICD-10-CM

## 2017-12-26 DIAGNOSIS — K5900 Constipation, unspecified: Secondary | ICD-10-CM | POA: Insufficient documentation

## 2017-12-26 LAB — CBC WITH DIFFERENTIAL (CANCER CENTER ONLY)
Basophils Absolute: 0 10*3/uL (ref 0.0–0.1)
Basophils Relative: 0 %
EOS ABS: 0.1 10*3/uL (ref 0.0–0.5)
EOS PCT: 1 %
HCT: 32.6 % — ABNORMAL LOW (ref 34.8–46.6)
Hemoglobin: 10.7 g/dL — ABNORMAL LOW (ref 11.6–15.9)
LYMPHS ABS: 1.8 10*3/uL (ref 0.9–3.3)
Lymphocytes Relative: 18 %
MCH: 26.8 pg (ref 25.1–34.0)
MCHC: 32.8 g/dL (ref 31.5–36.0)
MCV: 81.5 fL (ref 79.5–101.0)
MONOS PCT: 10 %
Monocytes Absolute: 1 10*3/uL — ABNORMAL HIGH (ref 0.1–0.9)
NRBC: 1 /100{WBCs} — AB
Neutro Abs: 7.2 10*3/uL — ABNORMAL HIGH (ref 1.5–6.5)
Neutrophils Relative %: 71 %
PLATELETS: 232 10*3/uL (ref 145–400)
RBC: 4 MIL/uL (ref 3.70–5.45)
RDW: 15.8 % — AB (ref 11.2–14.5)
WBC Count: 10.1 10*3/uL (ref 3.9–10.3)

## 2017-12-26 LAB — CMP (CANCER CENTER ONLY)
ALT: 61 U/L — AB (ref 0–55)
AST: 26 U/L (ref 5–34)
Albumin: 3.9 g/dL (ref 3.5–5.0)
Alkaline Phosphatase: 149 U/L (ref 40–150)
Anion gap: 9 (ref 3–11)
BUN: 12 mg/dL (ref 7–26)
CHLORIDE: 104 mmol/L (ref 98–109)
CO2: 26 mmol/L (ref 22–29)
CREATININE: 0.86 mg/dL (ref 0.60–1.10)
Calcium: 9.8 mg/dL (ref 8.4–10.4)
GFR, Est AFR Am: >60 mL/min (ref >60)
GFR, Estimated: >60 mL/min (ref >60)
GLUCOSE: 76 mg/dL (ref 70–140)
Potassium: 3.9 mmol/L (ref 3.5–5.1)
Sodium: 139 mmol/L (ref 136–145)
Total Bilirubin: 0.3 mg/dL (ref 0.2–1.2)
Total Protein: 7.7 g/dL (ref 6.4–8.3)

## 2017-12-26 MED ORDER — PALONOSETRON HCL INJECTION 0.25 MG/5ML
INTRAVENOUS | Status: AC
  Filled 2017-12-26: qty 5

## 2017-12-26 MED ORDER — SODIUM CHLORIDE 0.9 % IV SOLN
Freq: Once | INTRAVENOUS | Status: AC
  Administered 2017-12-26: 13:00:00 via INTRAVENOUS

## 2017-12-26 MED ORDER — SODIUM CHLORIDE 0.9 % IV SOLN
800.0000 mg/m2 | Freq: Once | INTRAVENOUS | Status: AC
  Administered 2017-12-26: 1672 mg via INTRAVENOUS
  Filled 2017-12-26: qty 43.97

## 2017-12-26 MED ORDER — SODIUM CHLORIDE 0.9% FLUSH
10.0000 mL | Freq: Once | INTRAVENOUS | Status: AC
  Administered 2017-12-26: 10 mL
  Filled 2017-12-26: qty 10

## 2017-12-26 MED ORDER — CARBOPLATIN CHEMO INJECTION 450 MG/45ML
300.0000 mg | Freq: Once | INTRAVENOUS | Status: AC
  Administered 2017-12-26: 300 mg via INTRAVENOUS
  Filled 2017-12-26: qty 30

## 2017-12-26 MED ORDER — DEXAMETHASONE SODIUM PHOSPHATE 10 MG/ML IJ SOLN
10.0000 mg | Freq: Once | INTRAMUSCULAR | Status: AC
  Administered 2017-12-26: 10 mg via INTRAVENOUS

## 2017-12-26 MED ORDER — HEPARIN SOD (PORK) LOCK FLUSH 100 UNIT/ML IV SOLN
500.0000 [IU] | Freq: Once | INTRAVENOUS | Status: AC | PRN
  Administered 2017-12-26: 500 [IU]
  Filled 2017-12-26: qty 5

## 2017-12-26 MED ORDER — SODIUM CHLORIDE 0.9% FLUSH
10.0000 mL | INTRAVENOUS | Status: DC | PRN
  Administered 2017-12-26: 10 mL
  Filled 2017-12-26: qty 10

## 2017-12-26 MED ORDER — DEXAMETHASONE SODIUM PHOSPHATE 10 MG/ML IJ SOLN
INTRAMUSCULAR | Status: AC
  Filled 2017-12-26: qty 1

## 2017-12-26 MED ORDER — PALONOSETRON HCL INJECTION 0.25 MG/5ML
0.2500 mg | Freq: Once | INTRAVENOUS | Status: AC
  Administered 2017-12-26: 0.25 mg via INTRAVENOUS

## 2017-12-26 NOTE — Telephone Encounter (Signed)
Gave patient AVS and calendar of upcoming march through may appointments.

## 2017-12-26 NOTE — Patient Instructions (Signed)
[facility] Discharge Instructions for Patients Receiving Chemotherapy  Today you received the following chemotherapy agents Gemzar and Carboplatin   To help prevent nausea and vomiting after your treatment, we encourage you to take your nausea medication as directed.    If you develop nausea and vomiting that is not controlled by your nausea medication, call the clinic.   BELOW ARE SYMPTOMS THAT SHOULD BE REPORTED IMMEDIATELY:  *FEVER GREATER THAN 100.5 F  *CHILLS WITH OR WITHOUT FEVER  NAUSEA AND VOMITING THAT IS NOT CONTROLLED WITH YOUR NAUSEA MEDICATION  *UNUSUAL SHORTNESS OF BREATH  *UNUSUAL BRUISING OR BLEEDING  TENDERNESS IN MOUTH AND THROAT WITH OR WITHOUT PRESENCE OF ULCERS  *URINARY PROBLEMS  *BOWEL PROBLEMS  UNUSUAL RASH Items with * indicate a potential emergency and should be followed up as soon as possible.  Feel free to call the clinic should you have any questions or concerns. The clinic phone number is [phone].  Please show the CHEMO ALERT CARD at check-in to the Emergency Department and triage nurse.   

## 2017-12-26 NOTE — Assessment & Plan Note (Signed)
06/30/2011:Breast cancer, IDC, Left, Stage III, Triple negative, 7.6 cm breast mass and palpable axillary mass November 2012 to January 2013: Neoadjuvant chemotherapy with dose dense FEC followed by dose dense Taxotere 12/21/2011: Left lumpectomy: High-grade poorly differentiated IDC 1.7 cm with high-grade DCIS, margins negative, 3/7 lymph nodes positive, ER 0%, PR 0%, HER-2 negative ratio 1.33 T1CN1 stage IIb  RELAPSE 09/26/2017: Left breast upper outer quadrant within the lumpectomy bed: Fibrosis no malignancy, right axillary lymph node biopsy metastatic high-grade carcinoma ER 0%, PR 0%, Ki-67 90%, HER-2 negative ratio 1.11 (similar to previous ductal carcinoma)  Current treatment:Carboplatin and gemcitabine days 1 and 8 every 3 weeks x6 cycles adjuvant therapy. Today is cycle 2 day 1  Chemo toxicities: 1.  Fatigue 2. Nausea 3.  Constipation   Return to clinic in 3 week for cycle 4

## 2017-12-26 NOTE — Progress Notes (Signed)
Patient Care Team: Patient, [name] as PCP - General ([place]) Neldon Mc, MD as Surgeon (General Surgery) Everardo All, MD (Hematology and Oncology)  DIAGNOSIS:  Encounter Diagnosis  Name Primary?  . 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, unspecified estrogen receptor status ([place])     SUMMARY OF ONCOLOGIC HISTORY:   Breast cancer, IDC, Left, Stage III, Triple negative   06/30/2011 Initial Diagnosis    Breast cancer, IDC, Left, Stage III, Triple negative, 7.6 cm breast mass and palpable axillary mass      09/06/2011 Miscellaneous    BRCA 1 and 2: Negative      09/14/2011 - 11/23/2011 Neo-Adjuvant Chemotherapy    Dose dense FEC followed by dose dense Taxotere      12/21/2011 Surgery    Left lumpectomy: High-grade poorly differentiated IDC 1.7 cm with high-grade DCIS, margins negative, 3/7 lymph nodes positive, ER 0%, PR 0%, HER-2 negative ratio 1.33 T1CN1 stage IIb      12/31/2011 - 02/15/2012 Radiation Therapy    Radiation at [facility]      09/26/2017 Relapse/Recurrence    Left breast upper outer quadrant within the lumpectomy bed: Fibrosis no malignancy, right axillary lymph node biopsy metastatic high-grade carcinoma ER 0%, PR 0%, Ki-67 90%, HER-2 negative ratio 1.11 (similar to previous ductal carcinoma)      11/14/2017 -  Chemotherapy    Adjuvant chemotherapy with Gemzar and carboplatin days 1 and 8 every 3 weeks       CHIEF COMPLIANT: Cycle 3-day 1 carboplatin and gemcitabine  INTERVAL HISTORY: IMAJEAN MCDERMID is a 19-year with above-mentioned history of relapsed left breast cancer who is currently on neoadjuvant chemotherapy with carboplatin and gemcitabine.  She is tolerating the chemo extremely well.  She is very concerned that the tumor may not be shrinking because she cannot feel that the size of the tumor is any smaller than before.  She does have discomfort in the axilla as well.  REVIEW OF SYSTEMS:   Constitutional: Denies  fevers, chills or abnormal weight loss Eyes: Denies blurriness of vision Ears, nose, mouth, throat, and face: Denies mucositis or sore throat Respiratory: Denies cough, dyspnea or wheezes Cardiovascular: Denies palpitation, chest discomfort Gastrointestinal:  Denies nausea, heartburn or change in bowel habits Skin: Denies abnormal skin rashes Lymphatics: Denies new lymphadenopathy or easy bruising Neurological:Denies numbness, tingling or new weaknesses Behavioral/Psych: Mood is stable, no new changes  Extremities: No lower extremity edema Breast: Patient reports that the tumor in the right breast does not appear to be getting smaller All other systems were reviewed with the patient and are negative.  I have reviewed the past medical history, past surgical history, social history and family history with the patient and they are unchanged from previous note.  ALLERGIES:  is allergic to codeine and lortab [hydrocodone-acetaminophen].  MEDICATIONS:  Current Outpatient Medications  Medication Sig Dispense Refill  . acetaminophen (TYLENOL) 500 MG tablet Take 500-1,000 mg by mouth every 6 (six) hours as needed. For pain    . amLODipine (NORVASC) 5 MG tablet Take 5 mg by mouth daily.  6  . Cholecalciferol 5000 units capsule Take 1 capsule (5,000 Units total) by mouth daily.    Marland Kitchen lidocaine-prilocaine (EMLA) cream Apply to affected area once 30 g 3  . loratadine (CLARITIN) 10 MG tablet Take 10 mg by mouth daily as needed (for seasonal allergies (Spring)).    Marland Kitchen LORazepam (ATIVAN) 0.5 MG tablet Take 1 tablet (0.5 mg total) by mouth  at bedtime as needed for sleep. 30 tablet 0  . ondansetron (ZOFRAN) 8 MG tablet Take 1 tablet (8 mg total) by mouth 2 (two) times daily as needed for refractory nausea / vomiting. Start on day 3 after chemotherapy. 30 tablet 1  . prochlorperazine (COMPAZINE) 10 MG tablet Take 1 tablet (10 mg total) by mouth every 6 (six) hours as needed (Nausea or vomiting). 30 tablet 1    . traMADol (ULTRAM) 50 MG tablet Take 2 tablets (100 mg total) by mouth every 6 (six) hours as needed. 10 tablet 0   No current facility-administered medications for this visit.    Facility-Administered Medications Ordered in Other Visits  Medication Dose Route Frequency Provider Last Rate Last Dose  . lactated ringers infusion    Continuous PRN Kerby Less, CRNA        PHYSICAL EXAMINATION: ECOG PERFORMANCE STATUS: 1 - Symptomatic but completely ambulatory  Vitals:   12/26/17 1141  BP: (!) 159/100  Pulse: 75  Resp: 20  Temp: 98.2 F (36.8 C)  SpO2: 100%   Filed Weights   12/26/17 1141  Weight: 218 lb 12.8 oz (99.2 kg)    GENERAL:alert, no distress and comfortable SKIN: skin color, texture, turgor are normal, no rashes or significant lesions EYES: normal, Conjunctiva are pink and non-injected, sclera clear OROPHARYNX:no exudate, no erythema and lips, buccal mucosa, and tongue normal  NECK: supple, thyroid normal size, non-tender, without nodularity LYMPH:  no palpable lymphadenopathy in the cervical, axillary or inguinal LUNGS: clear to auscultation and percussion with normal breathing effort HEART: regular rate & rhythm and no murmurs and no lower extremity edema ABDOMEN:abdomen soft, non-tender and normal bowel sounds MUSCULOSKELETAL:no cyanosis of digits and no clubbing  NEURO: alert & oriented x 3 with fluent speech, no focal motor/sensory deficits EXTREMITIES: No lower extremity edema  LABORATORY DATA:  I have reviewed the data as listed CMP Latest Ref Rng & Units 12/19/2017 12/12/2017 12/05/2017  Glucose 70 - 140 mg/dL 98 93 113  BUN 7 - 26 mg/dL 12 14 12   Creatinine 0.60 - 1.10 mg/dL 1.02 0.90 0.90  Sodium 136 - 145 mmol/L 142 138 138  Potassium 3.5 - 5.1 mmol/L 3.6 3.8 3.6  Chloride 98 - 109 mmol/L 105 105 105  CO2 22 - 29 mmol/L 27 25 25   Calcium 8.4 - 10.4 mg/dL 9.9 9.6 9.2  Total Protein 6.4 - 8.3 g/dL 7.5 7.4 7.1  Total Bilirubin 0.2 - 1.2 mg/dL  <0.2(L) 0.3 <0.2(L)  Alkaline Phos 40 - 150 U/L 208(H) 100 129  AST 5 - 34 U/L 25 28 21   ALT 0 - 55 U/L 63(H) 83(H) 53    Lab Results  Component Value Date   WBC 10.1 12/26/2017   HGB 12.6 11/09/2017   HCT 32.6 (L) 12/26/2017   MCV 81.5 12/26/2017   PLT 232 12/26/2017   NEUTROABS 7.2 (H) 12/26/2017    ASSESSMENT & PLAN:  Breast cancer, IDC, Left, Stage III, Triple negative 06/30/2011:Breast cancer, IDC, Left, Stage III, Triple negative, 7.6 cm breast mass and palpable axillary mass November 2012 to January 2013: Neoadjuvant chemotherapy with dose dense FEC followed by dose dense Taxotere 12/21/2011: Left lumpectomy: High-grade poorly differentiated IDC 1.7 cm with high-grade DCIS, margins negative, 3/7 lymph nodes positive, ER 0%, PR 0%, HER-2 negative ratio 1.33 T1CN1 stage IIb  RELAPSE 09/26/2017: Left breast upper outer quadrant within the lumpectomy bed: Fibrosis no malignancy, right axillary lymph node biopsy metastatic high-grade carcinoma ER 0%, PR 0%, Ki-67  90%, HER-2 negative ratio 1.11 (similar to previous ductal carcinoma)  Current treatment:Carboplatin and gemcitabine days 1 and 8 every 3 weeks x6 cycles adjuvant therapy. Today is cycle 3 day 1  Chemo toxicities: 1.  Fatigue 2. Nausea 3.  Constipation   Because the patient feels that the tumor is not getting any smaller, I recommended that we obtain an ultrasound of the breast and see her on her fourth cycle of her chemotherapy.  Return to clinic in 3 week for cycle 4  I spent 25 minutes talking to the patient of which more than half was spent in counseling and coordination of care.  No orders of the defined types were placed in this encounter.  The patient has a good understanding of the overall plan. she agrees with it. she will call with any problems that may develop before the next visit here.   Harriette Ohara, MD 12/26/17

## 2017-12-28 ENCOUNTER — Ambulatory Visit: Payer: BLUE CROSS/BLUE SHIELD

## 2018-01-02 ENCOUNTER — Inpatient Hospital Stay: Payer: BLUE CROSS/BLUE SHIELD

## 2018-01-02 VITALS — BP 162/90 | HR 79 | Temp 98.1°F | Resp 17

## 2018-01-02 DIAGNOSIS — C50012 Malignant neoplasm of nipple and areola, left female breast: Secondary | ICD-10-CM

## 2018-01-02 DIAGNOSIS — Z95828 Presence of other vascular implants and grafts: Secondary | ICD-10-CM

## 2018-01-02 DIAGNOSIS — C50412 Malignant neoplasm of upper-outer quadrant of left female breast: Secondary | ICD-10-CM | POA: Diagnosis not present

## 2018-01-02 DIAGNOSIS — N6331 Unspecified lump in axillary tail of the right breast: Secondary | ICD-10-CM

## 2018-01-02 LAB — CBC WITH DIFFERENTIAL (CANCER CENTER ONLY)
Basophils Absolute: 0.1 10*3/uL (ref 0.0–0.1)
Basophils Relative: 2 %
EOS ABS: 0 10*3/uL (ref 0.0–0.5)
EOS PCT: 2 %
HCT: 29.4 % — ABNORMAL LOW (ref 34.8–46.6)
Hemoglobin: 9.6 g/dL — ABNORMAL LOW (ref 11.6–15.9)
LYMPHS PCT: 55 %
Lymphs Abs: 1.5 10*3/uL (ref 0.9–3.3)
MCH: 26.3 pg (ref 25.1–34.0)
MCHC: 32.7 g/dL (ref 31.5–36.0)
MCV: 80.5 fL (ref 79.5–101.0)
MONO ABS: 0.2 10*3/uL (ref 0.1–0.9)
Monocytes Relative: 7 %
Neutro Abs: 0.9 10*3/uL — ABNORMAL LOW (ref 1.5–6.5)
Neutrophils Relative %: 34 %
PLATELETS: 306 10*3/uL (ref 145–400)
RBC: 3.65 MIL/uL — AB (ref 3.70–5.45)
RDW: 15.5 % — AB (ref 11.2–14.5)
WBC: 2.7 10*3/uL — AB (ref 3.9–10.3)

## 2018-01-02 LAB — CMP (CANCER CENTER ONLY)
ALBUMIN: 3.8 g/dL (ref 3.5–5.0)
ALT: 79 U/L — AB (ref 0–55)
AST: 30 U/L (ref 5–34)
Alkaline Phosphatase: 103 U/L (ref 40–150)
Anion gap: 8 (ref 3–11)
BUN: 13 mg/dL (ref 7–26)
CHLORIDE: 107 mmol/L (ref 98–109)
CO2: 26 mmol/L (ref 22–29)
CREATININE: 0.81 mg/dL (ref 0.60–1.10)
Calcium: 9.5 mg/dL (ref 8.4–10.4)
GFR, Est AFR Am: >60 mL/min (ref >60)
GLUCOSE: 99 mg/dL (ref 70–140)
Potassium: 3.7 mmol/L (ref 3.5–5.1)
Sodium: 141 mmol/L (ref 136–145)
Total Bilirubin: 0.3 mg/dL (ref 0.2–1.2)
Total Protein: 7.4 g/dL (ref 6.4–8.3)

## 2018-01-02 MED ORDER — SODIUM CHLORIDE 0.9% FLUSH
10.0000 mL | INTRAVENOUS | Status: DC | PRN
  Administered 2018-01-02: 10 mL
  Filled 2018-01-02: qty 10

## 2018-01-02 MED ORDER — SODIUM CHLORIDE 0.9 % IV SOLN
Freq: Once | INTRAVENOUS | Status: AC
  Administered 2018-01-02: 13:00:00 via INTRAVENOUS

## 2018-01-02 MED ORDER — DEXAMETHASONE SODIUM PHOSPHATE 10 MG/ML IJ SOLN
INTRAMUSCULAR | Status: AC
  Filled 2018-01-02: qty 1

## 2018-01-02 MED ORDER — SODIUM CHLORIDE 0.9% FLUSH
10.0000 mL | Freq: Once | INTRAVENOUS | Status: AC
  Administered 2018-01-02: 10 mL
  Filled 2018-01-02: qty 10

## 2018-01-02 MED ORDER — PALONOSETRON HCL INJECTION 0.25 MG/5ML
INTRAVENOUS | Status: AC
  Filled 2018-01-02: qty 5

## 2018-01-02 MED ORDER — PALONOSETRON HCL INJECTION 0.25 MG/5ML
0.2500 mg | Freq: Once | INTRAVENOUS | Status: AC
  Administered 2018-01-02: 0.25 mg via INTRAVENOUS

## 2018-01-02 MED ORDER — DEXAMETHASONE SODIUM PHOSPHATE 10 MG/ML IJ SOLN
10.0000 mg | Freq: Once | INTRAMUSCULAR | Status: AC
  Administered 2018-01-02: 10 mg via INTRAVENOUS

## 2018-01-02 MED ORDER — GEMCITABINE HCL CHEMO INJECTION 1 GM/26.3ML
800.0000 mg/m2 | Freq: Once | INTRAVENOUS | Status: AC
  Administered 2018-01-02: 1672 mg via INTRAVENOUS
  Filled 2018-01-02: qty 43.97

## 2018-01-02 MED ORDER — HEPARIN SOD (PORK) LOCK FLUSH 100 UNIT/ML IV SOLN
500.0000 [IU] | Freq: Once | INTRAVENOUS | Status: AC | PRN
  Administered 2018-01-02: 500 [IU]
  Filled 2018-01-02: qty 5

## 2018-01-02 MED ORDER — SODIUM CHLORIDE 0.9 % IV SOLN
300.0000 mg | Freq: Once | INTRAVENOUS | Status: AC
  Administered 2018-01-02: 300 mg via INTRAVENOUS
  Filled 2018-01-02: qty 30

## 2018-01-02 NOTE — Patient Instructions (Signed)
[facility] Discharge Instructions for Patients Receiving Chemotherapy  Today you received the following chemotherapy agents: Gemcitabine (Gemzar) and Carboplatin (Paraplatin).  To help prevent nausea and vomiting after your treatment, we encourage you to take your nausea medication as prescribed. Received Aloxi during treatment-->take Compazine (not Zofran) for the next 3 days.  If you develop nausea and vomiting that is not controlled by your nausea medication, call the clinic.   BELOW ARE SYMPTOMS THAT SHOULD BE REPORTED IMMEDIATELY:  *FEVER GREATER THAN 100.5 F  *CHILLS WITH OR WITHOUT FEVER  NAUSEA AND VOMITING THAT IS NOT CONTROLLED WITH YOUR NAUSEA MEDICATION  *UNUSUAL SHORTNESS OF BREATH  *UNUSUAL BRUISING OR BLEEDING  TENDERNESS IN MOUTH AND THROAT WITH OR WITHOUT PRESENCE OF ULCERS  *URINARY PROBLEMS  *BOWEL PROBLEMS  UNUSUAL RASH Items with * indicate a potential emergency and should be followed up as soon as possible.  Feel free to call the clinic should you have any questions or concerns. The clinic phone number is [phone].  Please show the [place] at check-in to the Emergency Department and triage nurse.   Managing Low Blood Counts During Cancer Treatment Cancer treatments such as chemotherapy and radiation can sometimes cause a drop in the supply of blood cells in the body, including red blood cells, white blood cells, and platelets. These blood cells are produced in the body and are released into the blood to perform specific functions:  Red blood cells carry gases such as oxygen and carbon dioxide to and from your lungs.  White blood cells help protect you from infection.  Platelets help your body to form blood clots to prevent and control bleeding.  When cancer treatments cause a drop in blood cell counts, your body may not have enough cells to keep up its normal functions. Symptoms or problems that may result will vary  depending on which type of blood cells the treatment is affecting. If your blood counts are low, you can take steps to help manage any problems. How can low blood counts affect me? Low blood counts have various effects depending on the type of blood cells involved:  If you have a low number of red blood cells, you have a condition called anemia. This can cause symptoms such as: ? Feeling tired and weak. ? Feeling light-headed. ? Being short of breath.  If you have a low number of white blood cells, you may be at higher risk for infections.  If you have a low number of platelets, you may bleed more easily, or your body may have trouble stopping any bleeding. You may also have more bruising.  How to manage symptoms or prevent problems from a low blood count If you have a low blood count, you can take steps to manage symptoms or prevent problems that may develop. The steps to take will depend on which type of blood cell is low. Low red blood cells Take these steps to help manage the symptoms of anemia:  Go for a walk or do some light exercise each day.  Take short naps during the day.  Eat foods that contain a lot of iron and protein. These include leafy vegetables, meat and fish, beans, sweet potatoes, and dried fruit such as prunes, raisins, and apricots.  Ask for help with errands and with work that needs to be done around the house. It is important to save your energy.  Take vitamins or supplements-such as iron, vitamin W88, or folic acid-as told by  your health care provider.  Practice relaxation techniques, such as yoga or meditation.  Low white blood cells Take these steps to help prevent infections:  Wash your hands often with warm, soapy water.  Avoid crowds of people and any person who has the flu or a fever.  Take care when cleaning yourself after using the bathroom. Tell your health care provider if you have any rectal sores or bleeding.  Avoid dental work. Check your  mouth each day for sores or signs of infection.  Do not share utensils.  Avoid contact with pet waste. Wash your hands after handling pets.  If you get a scrape or cut, clean it thoroughly right away.  Avoid fresh plants or dried flowers.  Do not swim or wade in lakes, ponds, rivers, water parks, or hot tubs.  Follow food safety guidelines. Cook meat thoroughly and wash all raw fruits and vegetables.  You may be instructed to wear a mask when around others to protect yourself.  Low platelets Take these steps to help prevent or control bleeding and bruising:  Use an electric razor for shaving instead of a blade.  Use a soft toothbrush and be careful during oral care. Talk with your cancer care team about whether you should avoid flossing. If your mouth is bleeding, rinse it with ice water.  Avoid activities that could cause injury, such as contact sports.  Talk with your health care provider about using laxatives or stool softeners to avoid constipation.  Do not use medicines such as ibuprofen, aspirin, or naproxen unless your health care provider tells you to.  Limit alcohol use.  Monitor any bleeding closely. If you start bleeding, hold pressure on the area for 5 minutes to stop the bleeding. Bleeding that does not stop is considered an emergency.  What treatments can help increase a low blood count? If needed, your health care provider may recommend treatment for a low blood count. Treatment will depend on the type of blood cell that is low and the severity of your condition. Treatment options may include:  Taking medicines to help stimulate the growth of blood cells. This is an option for treating a low red blood cell count. Your health care provider may also recommend that you take iron, folic acid, or vitamin B12 supplements.  Making dietary changes. Including more iron and protein in your diet can help stimulate the growth of red blood cells.  Adjusting your current  medicines to help raise blood counts.  Making changes to your treatment plan.  Having a blood transfusion. This may be done if your blood count is very low.  Contact a health care provider if:  You feel extremely tired and weak.  You have more bruising or bleeding.  You feel ill or you develop a cough.  You have swelling or redness.  You have mouth sores or a sore throat.  You have painful urination or you have blood in your urine or stool.  You are thinking of taking any new supplements or vitamins or making dietary changes. Get help right away if:  You are short of breath, have chest pain, or feel dizzy.  You have a fever or chills.  You have abdominal pain or diarrhea.  You have bleeding that will not stop. Summary  Cancer treatments such as chemotherapy and radiation can sometimes cause a drop in the supply of blood cells in the body, including red blood cells, white blood cells, and platelets.  If you have a low  blood count, you can take steps to manage symptoms or prevent problems that may develop.  Depending on which type of blood cell is low, you may need to take steps to prevent infection, prevent bleeding, or manage symptoms that may develop.  If needed, your health care provider may recommend treatment for a low blood count. This information is not intended to replace advice given to you by your health care provider. Make sure you discuss any questions you have with your health care provider. Document Released: 06/05/2016 Document Revised: 06/05/2016 Document Reviewed: 06/05/2016 Elsevier Interactive Patient Education  2018 [employer].   Neutropenia Neutropenia is a condition that occurs when you have a lower-than-normal level of a type of white blood cell (neutrophil) in your body. Neutrophils are made in the spongy center of large bones (bone marrow) and they fight infections. Neutrophils are your body's main defense against bacterial and fungal  infections. The fewer neutrophils you have and the longer your body remains without them, the greater your risk of getting a severe infection. What are the causes? This condition can occur if your body uses up or destroys neutrophils faster than your bone marrow can make them. This problem may happen because of:  Bacterial or fungal infection.  Allergic disorders.  Reactions to some medicines.  Autoimmune disease.  An enlarged spleen.  This condition can also occur if your bone marrow does not produce enough neutrophils. This problem may be caused by:  Cancer.  Cancer treatments, such as radiation or chemotherapy.  Viral infections.  Medicines, such as phenytoin.  Vitamin B12 deficiency.  Diseases of the bone marrow.  Environmental toxins, such as insecticides.  What are the signs or symptoms? This condition does not usually cause symptoms. If symptoms are present, they are usually caused by an underlying infection. Symptoms of an infection may include:  Fever.  Chills.  Swollen glands.  Oral or anal ulcers.  Cough and shortness of breath.  Rash.  Skin infection.  Fatigue.  How is this diagnosed? Your health care provider may suspect neutropenia if you have:  A condition that may cause neutropenia.  Symptoms of infection, especially fever.  Frequent and unusual infections.  You will have a medical history and physical exam. Tests will also be done, such as:  A complete blood count (CBC).  A procedure to collect a sample of bone marrow for examination (bone marrow biopsy).  A chest X-ray.  A urine culture.  A blood culture.  How is this treated? Treatment depends on the underlying cause and severity of your condition. Mild neutropenia may not require treatment. Treatment may include medicines, such as:  Antibiotic medicine given through an IV tube.  Antiviral medicines.  Antifungal medicines.  A medicine to increase neutrophil production  (colony-stimulating factor). You may get this drug through an IV tube or by injection.  Steroids given through an IV tube.  If an underlying condition is causing neutropenia, you may need treatment for that condition. If medicines you are taking are causing neutropenia, your health care provider may have you stop taking those medicines. Follow these instructions at home: Medicines  Take over-the-counter and prescription medicines only as told by your health care provider.  Get a seasonal flu shot (influenza vaccine). Lifestyle  Do not eat unpasteurized foods.Do not eat unwashed raw fruits or vegetables.  Avoid exposure to groups of people or children.  Avoid being around people who are sick.  Avoid being around dirt or dust, such as in construction areas  or gardens.  Do not provide direct care for pets. Avoid animal droppings. Do not clean litter boxes and bird cages. Hygiene   Bathe daily.  Clean the area between the genitals and the anus (perineal area) after you urinate or have a bowel movement. If you are female, wipe from front to back.  Brush your teeth with a soft toothbrush before and after meals.  Do not use a razor that has a blade. Use an electric razor to remove hair.  Wash your hands often. Make sure others who come in contact with you also wash their hands. If soap and water are not available, use hand sanitizer. General instructions  Do not have sex unless your health care provider has approved.  Take actions to avoid cuts and burns. For example: ? Be cautious when you use knives. Always cut away from yourself. ? Keep knives in protective sheaths or guards when not in use. ? Use oven mitts when you cook with a hot stove, oven, or grill. ? Stand a safe distance away from open fires.  Avoid people who received a vaccine in the past 30 days if that vaccine contained a live version of the germ (live vaccine). You should not get a live vaccine. Common live  vaccines are varicella, measles, mumps, and rubella.  Do not share food utensils.  Do not use tampons, enemas, or rectal suppositories unless your health care provider has approved.  Keep all appointments as told by your health care provider. This is important. Contact a health care provider if:  You have a fever.  You have chills or you start to shake.  You have: ? A sore throat. ? A warm, red, or tender area on your skin. ? A cough. ? Frequent or painful urination. ? Vaginal discharge or itching.  You develop: ? Sores in your mouth or anus. ? Swollen lymph nodes. ? Red streaks on the skin. ? A rash.  You feel: ? Nauseous or you vomit. ? Very fatigued. ? Short of breath. This information is not intended to replace advice given to you by your health care provider. Make sure you discuss any questions you have with your health care provider. Document Released: 04/02/2002 Document Revised: 03/18/2016 Document Reviewed: 04/23/2015 Elsevier Interactive Patient Education  [employer].

## 2018-01-02 NOTE — Progress Notes (Signed)
Per Dr. Lindi Adie: OK to treat with ANC of 0.9. Reinforced neutropenic precautions with pt and significant other. Will continue to monitor

## 2018-01-02 NOTE — Patient Instructions (Signed)
[facility] Discharge Instructions for Patients Receiving Chemotherapy  Today you received the following chemotherapy agents Carboplatin and Gemzar  To help prevent nausea and vomiting after your treatment, we encourage you to take your nausea medication as directed.  If you develop nausea and vomiting that is not controlled by your nausea medication, call the clinic.   BELOW ARE SYMPTOMS THAT SHOULD BE REPORTED IMMEDIATELY:  *FEVER GREATER THAN 100.5 F  *CHILLS WITH OR WITHOUT FEVER  NAUSEA AND VOMITING THAT IS NOT CONTROLLED WITH YOUR NAUSEA MEDICATION  *UNUSUAL SHORTNESS OF BREATH  *UNUSUAL BRUISING OR BLEEDING  TENDERNESS IN MOUTH AND THROAT WITH OR WITHOUT PRESENCE OF ULCERS  *URINARY PROBLEMS  *BOWEL PROBLEMS  UNUSUAL RASH Items with * indicate a potential emergency and should be followed up as soon as possible.  Feel free to call the clinic should you have any questions or concerns. The clinic phone number is [phone].  Please show the CHEMO ALERT CARD at check-in to the Emergency Department and triage nurse.   

## 2018-01-04 ENCOUNTER — Inpatient Hospital Stay: Payer: BLUE CROSS/BLUE SHIELD

## 2018-01-04 DIAGNOSIS — C50412 Malignant neoplasm of upper-outer quadrant of left female breast: Secondary | ICD-10-CM | POA: Diagnosis not present

## 2018-01-04 DIAGNOSIS — N6331 Unspecified lump in axillary tail of the right breast: Secondary | ICD-10-CM

## 2018-01-04 MED ORDER — PEGFILGRASTIM-CBQV 6 MG/0.6ML ~~LOC~~ SOSY
PREFILLED_SYRINGE | SUBCUTANEOUS | Status: AC
  Filled 2018-01-04: qty 0.6

## 2018-01-04 MED ORDER — PEGFILGRASTIM-CBQV 6 MG/0.6ML ~~LOC~~ SOSY
6.0000 mg | PREFILLED_SYRINGE | Freq: Once | SUBCUTANEOUS | Status: AC
  Administered 2018-01-04: 6 mg via SUBCUTANEOUS

## 2018-01-12 ENCOUNTER — Encounter: Payer: Self-pay | Admitting: Genetic Counselor

## 2018-01-12 ENCOUNTER — Ambulatory Visit: Payer: Self-pay | Admitting: Genetic Counselor

## 2018-01-12 ENCOUNTER — Telehealth: Payer: Self-pay | Admitting: Genetic Counselor

## 2018-01-12 DIAGNOSIS — Z1379 Encounter for other screening for genetic and chromosomal anomalies: Secondary | ICD-10-CM

## 2018-01-12 DIAGNOSIS — Z853 Personal history of malignant neoplasm of breast: Secondary | ICD-10-CM

## 2018-01-12 DIAGNOSIS — Z803 Family history of malignant neoplasm of breast: Secondary | ICD-10-CM

## 2018-01-12 DIAGNOSIS — C50012 Malignant neoplasm of nipple and areola, left female breast: Secondary | ICD-10-CM

## 2018-01-12 NOTE — Progress Notes (Signed)
HPI: Ms. Frese was previously seen in the [place] clinic due to a personal and family history of cancer and concerns regarding a hereditary predisposition to cancer. Please refer to our prior cancer genetics clinic note for more information regarding Ms. Capizzi's medical, social and family histories, and our assessment and recommendations, at the time. Ms. Winzer recent genetic test results were disclosed to her, as were recommendations warranted by these results. These results and recommendations are discussed in more detail below.  CANCER HISTORY:    Breast cancer, IDC, Left, Stage III, Triple negative   06/30/2011 Initial Diagnosis    Breast cancer, IDC, Left, Stage III, Triple negative, 7.6 cm breast mass and palpable axillary mass      09/06/2011 Miscellaneous    BRCA 1 and 2: Negative      09/14/2011 - 11/23/2011 Neo-Adjuvant Chemotherapy    Dose dense FEC followed by dose dense Taxotere      12/21/2011 Surgery    Left lumpectomy: High-grade poorly differentiated IDC 1.7 cm with high-grade DCIS, margins negative, 3/7 lymph nodes positive, ER 0%, PR 0%, HER-2 negative ratio 1.33 T1CN1 stage IIb      12/31/2011 - 02/15/2012 Radiation Therapy    Radiation at [facility]      09/26/2017 Relapse/Recurrence    Left breast upper outer quadrant within the lumpectomy bed: Fibrosis no malignancy, right axillary lymph node biopsy metastatic high-grade carcinoma ER 0%, PR 0%, Ki-67 90%, HER-2 negative ratio 1.11 (similar to previous ductal carcinoma)      11/14/2017 -  Chemotherapy    Adjuvant chemotherapy with Gemzar and carboplatin days 1 and 8 every 3 weeks      01/10/2018 Genetic Testing    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  The Hereditary Gene Panel offered by [facility] includes sequencing and/or deletion duplication testing of the following 47 genes: APC, ATM, AXIN2, BARD1, BMPR1A, BRCA1, BRCA2, BRIP1, CDH1, CDK4, CDKN2A  (p14ARF), CDKN2A (p16INK4a), CHEK2, CTNNA1, DICER1, EPCAM (Deletion/duplication testing only), GREM1 (promoter region deletion/duplication testing only), KIT, MEN1, MLH1, MSH2, MSH3, MSH6, MUTYH, NBN, NF1, NHTL1, PALB2, PDGFRA, PMS2, POLD1, POLE, PTEN, RAD50, RAD51C, RAD51D, SDHB, SDHC, SDHD, SMAD4, SMARCA4. STK11, TP53, TSC1, TSC2, and VHL.  The following genes were evaluated for sequence changes only: SDHA and HOXB13 c.251G>A variant only. The report date is January 10, 2018.        FAMILY HISTORY:  We obtained a detailed, 4-generation family history.  Significant diagnoses are listed below: Family History  Problem Relation Age of Onset  . Cancer Father 97       lung cancer   . Cancer Maternal Grandmother        breast  . Cancer Maternal Grandfather   . Breast cancer Other        MGMs mother  . Colon cancer Neg Hx     The patient has one daughter who is cancer free.  She has a maternal half brother and sister who are cancer free.  Her father is deceased from lung cancer and her mother is alive.  The patient's father was a captain in [employer] and developed lung cancer.  It is thought his cancer was from exposures on the ship.  Nothing is known about his family.  The patient's mother had four brothers, none had cancer.  The maternal grandmother and her mother both had breast cancer.  The maternal grandfather died of an unknown cancer.  Reportedly there are several individuals on his side  of the family with cancer.  Ms. Hukill is unaware of previous family history of genetic testing for hereditary cancer risks. Patient's maternal ancestors are of African American descent, and paternal ancestors are of African American descent. There is no reported Ashkenazi Jewish ancestry. There is no known consanguinity  GENETIC TEST RESULTS: Genetic testing reported out on January 10, 2018 through the common hereditary cancer panel found no deleterious mutations.  The Hereditary Gene Panel offered by  [facility] includes sequencing and/or deletion duplication testing of the following 47 genes: APC, ATM, AXIN2, BARD1, BMPR1A, BRCA1, BRCA2, BRIP1, CDH1, CDK4, CDKN2A (p14ARF), CDKN2A (p16INK4a), CHEK2, CTNNA1, DICER1, EPCAM (Deletion/duplication testing only), GREM1 (promoter region deletion/duplication testing only), KIT, MEN1, MLH1, MSH2, MSH3, MSH6, MUTYH, NBN, NF1, NHTL1, PALB2, PDGFRA, PMS2, POLD1, POLE, PTEN, RAD50, RAD51C, RAD51D, SDHB, SDHC, SDHD, SMAD4, SMARCA4. STK11, TP53, TSC1, TSC2, and VHL.  The following genes were evaluated for sequence changes only: SDHA and HOXB13 c.251G>A variant only.  The test report has been scanned into EPIC and is located under the Molecular Pathology section of the Results Review tab.   We discussed with Ms. Mcglade that since the current genetic testing is not perfect, it is possible there may be a gene mutation in one of these genes that current testing cannot detect, but that chance is small. We also discussed, that it is possible that another gene that has not yet been discovered, or that we have not yet tested, is responsible for the cancer diagnoses in the family, and it is, therefore, important to remain in touch with cancer genetics in the future so that we can continue to offer Ms. Lerman the most up to date genetic testing.   Genetic testing did detect a Variant of Unknown Significance in the SDHA gene called c.1375G>C (p.Asp459His).  At this time, it is unknown if this variant is associated with increased cancer risk or if this is a normal finding, but most variants such as this get reclassified to being inconsequential. It should not be used to make medical management decisions. With time, we suspect the lab will determine the significance of this variant, if any. If we do learn more about it, we will try to contact Ms. Dickerson to discuss it further. However, it is important to stay in touch with [place] periodically and keep the address and phone number up to  date.    CANCER SCREENING RECOMMENDATIONS: This result is reassuring and indicates that Ms. Stephani likely does not have an increased risk for a future cancer due to a mutation in one of these genes. This normal test also suggests that Ms. Delpriore's cancer was most likely not due to an inherited predisposition associated with one of these genes.  Most cancers happen by chance and this negative test suggests that her cancer falls into this category.  We, therefore, recommended she continue to follow the cancer management and screening guidelines provided by her oncology and primary healthcare provider.   An individual's cancer risk and medical management are not determined by genetic test results alone. Overall cancer risk assessment incorporates additional factors, including personal medical history, family history, and any available genetic information that may result in a personalized plan for cancer prevention and surveillance.  RECOMMENDATIONS FOR FAMILY MEMBERS: Women in this family might be at some increased risk of developing cancer, over the general population risk, simply due to the family history of cancer. We recommended women in this family have a yearly mammogram beginning at age 68, or 60 years  younger than the earliest onset of cancer, an annual clinical breast exam, and perform monthly breast self-exams. Women in this family should also have a gynecological exam as recommended by their primary provider. All family members should have a colonoscopy by age 62.  FOLLOW-UP: Lastly, we discussed with Ms. Beachem that cancer genetics is a rapidly advancing field and it is possible that new genetic tests will be appropriate for her and/or her family members in the future. We encouraged her to remain in contact with cancer genetics on an annual basis so we can update her personal and family histories and let her know of advances in cancer genetics that may benefit this family.   Our contact  number was provided. Ms. Chay questions were answered to her satisfaction, and she knows she is welcome to call us at anytime with additional questions or concerns.   Roma Kayser, MS, [facility] Certified Genetic Counselor [email or web address]

## 2018-01-12 NOTE — Telephone Encounter (Signed)
Revealed negative genetic testing.  Discussed that we do not know why she has breast cancer or why there is cancer in the family. It could be due to a different gene that we are not testing, or maybe our current technology may not be able to pick something up.  It will be important for her to keep in contact with genetics to keep up with whether additional testing may be needed.  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  Reassured that this is still a normal result.  We will recontact her in the future when this is reclassified.

## 2018-01-16 ENCOUNTER — Inpatient Hospital Stay: Payer: BLUE CROSS/BLUE SHIELD

## 2018-01-16 ENCOUNTER — Encounter: Payer: Self-pay | Admitting: *Deleted

## 2018-01-16 ENCOUNTER — Inpatient Hospital Stay (HOSPITAL_BASED_OUTPATIENT_CLINIC_OR_DEPARTMENT_OTHER): Payer: BLUE CROSS/BLUE SHIELD | Admitting: Hematology and Oncology

## 2018-01-16 ENCOUNTER — Telehealth: Payer: Self-pay | Admitting: Hematology and Oncology

## 2018-01-16 VITALS — BP 136/77

## 2018-01-16 DIAGNOSIS — Z853 Personal history of malignant neoplasm of breast: Secondary | ICD-10-CM

## 2018-01-16 DIAGNOSIS — R5383 Other fatigue: Secondary | ICD-10-CM

## 2018-01-16 DIAGNOSIS — C50012 Malignant neoplasm of nipple and areola, left female breast: Secondary | ICD-10-CM

## 2018-01-16 DIAGNOSIS — K5900 Constipation, unspecified: Secondary | ICD-10-CM

## 2018-01-16 DIAGNOSIS — C50412 Malignant neoplasm of upper-outer quadrant of left female breast: Secondary | ICD-10-CM | POA: Diagnosis not present

## 2018-01-16 DIAGNOSIS — Z171 Estrogen receptor negative status [ER-]: Secondary | ICD-10-CM

## 2018-01-16 DIAGNOSIS — Z7689 Persons encountering health services in other specified circumstances: Secondary | ICD-10-CM

## 2018-01-16 DIAGNOSIS — R110 Nausea: Secondary | ICD-10-CM | POA: Diagnosis not present

## 2018-01-16 DIAGNOSIS — Z5111 Encounter for antineoplastic chemotherapy: Secondary | ICD-10-CM | POA: Diagnosis not present

## 2018-01-16 DIAGNOSIS — Z95828 Presence of other vascular implants and grafts: Secondary | ICD-10-CM

## 2018-01-16 DIAGNOSIS — N6331 Unspecified lump in axillary tail of the right breast: Secondary | ICD-10-CM

## 2018-01-16 LAB — CBC WITH DIFFERENTIAL (CANCER CENTER ONLY)
BASOS PCT: 0 %
Basophils Absolute: 0 10*3/uL (ref 0.0–0.1)
Eosinophils Absolute: 0 10*3/uL (ref 0.0–0.5)
Eosinophils Relative: 1 %
HEMATOCRIT: 29.4 % — AB (ref 34.8–46.6)
HEMOGLOBIN: 9.5 g/dL — AB (ref 11.6–15.9)
LYMPHS PCT: 22 %
Lymphs Abs: 1.4 10*3/uL (ref 0.9–3.3)
MCH: 27.1 pg (ref 25.1–34.0)
MCHC: 32.3 g/dL (ref 31.5–36.0)
MCV: 83.8 fL (ref 79.5–101.0)
MONO ABS: 0.8 10*3/uL (ref 0.1–0.9)
MONOS PCT: 12 %
NEUTROS ABS: 4.2 10*3/uL (ref 1.5–6.5)
NEUTROS PCT: 65 %
NRBC: 1 /100{WBCs} — AB
Platelet Count: 222 10*3/uL (ref 145–400)
RBC: 3.51 MIL/uL — ABNORMAL LOW (ref 3.70–5.45)
RDW: 18.5 % — ABNORMAL HIGH (ref 11.2–14.5)
WBC Count: 6.5 10*3/uL (ref 3.9–10.3)

## 2018-01-16 LAB — CMP (CANCER CENTER ONLY)
ALK PHOS: 118 U/L (ref 40–150)
ALT: 41 U/L (ref 0–55)
ANION GAP: 8 (ref 3–11)
AST: 22 U/L (ref 5–34)
Albumin: 3.7 g/dL (ref 3.5–5.0)
BUN: 12 mg/dL (ref 7–26)
CALCIUM: 9.4 mg/dL (ref 8.4–10.4)
CO2: 26 mmol/L (ref 22–29)
CREATININE: 0.85 mg/dL (ref 0.60–1.10)
Chloride: 106 mmol/L (ref 98–109)
GFR, Estimated: >60 mL/min (ref >60)
Glucose, Bld: 115 mg/dL (ref 70–140)
Potassium: 3.5 mmol/L (ref 3.5–5.1)
SODIUM: 140 mmol/L (ref 136–145)
TOTAL PROTEIN: 7.1 g/dL (ref 6.4–8.3)

## 2018-01-16 MED ORDER — PALONOSETRON HCL INJECTION 0.25 MG/5ML
INTRAVENOUS | Status: AC
  Filled 2018-01-16: qty 5

## 2018-01-16 MED ORDER — PALONOSETRON HCL INJECTION 0.25 MG/5ML
0.2500 mg | Freq: Once | INTRAVENOUS | Status: AC
Start: 2018-01-16 — End: 2018-01-16
  Administered 2018-01-16: 0.25 mg via INTRAVENOUS

## 2018-01-16 MED ORDER — SODIUM CHLORIDE 0.9 % IV SOLN
800.0000 mg/m2 | Freq: Once | INTRAVENOUS | Status: AC
  Administered 2018-01-16: 1672 mg via INTRAVENOUS
  Filled 2018-01-16: qty 43.97

## 2018-01-16 MED ORDER — SODIUM CHLORIDE 0.9% FLUSH
10.0000 mL | INTRAVENOUS | Status: DC | PRN
  Filled 2018-01-16: qty 10

## 2018-01-16 MED ORDER — DEXAMETHASONE SODIUM PHOSPHATE 10 MG/ML IJ SOLN
INTRAMUSCULAR | Status: AC
  Filled 2018-01-16: qty 1

## 2018-01-16 MED ORDER — HEPARIN SOD (PORK) LOCK FLUSH 100 UNIT/ML IV SOLN
500.0000 [IU] | Freq: Once | INTRAVENOUS | Status: DC | PRN
  Filled 2018-01-16: qty 5

## 2018-01-16 MED ORDER — SODIUM CHLORIDE 0.9 % IV SOLN
Freq: Once | INTRAVENOUS | Status: AC
  Administered 2018-01-16: 14:00:00 via INTRAVENOUS

## 2018-01-16 MED ORDER — SODIUM CHLORIDE 0.9% FLUSH
10.0000 mL | Freq: Once | INTRAVENOUS | Status: AC
  Administered 2018-01-16: 10 mL
  Filled 2018-01-16: qty 10

## 2018-01-16 MED ORDER — DEXAMETHASONE SODIUM PHOSPHATE 10 MG/ML IJ SOLN
10.0000 mg | Freq: Once | INTRAMUSCULAR | Status: AC
  Administered 2018-01-16: 10 mg via INTRAVENOUS

## 2018-01-16 MED ORDER — SODIUM CHLORIDE 0.9 % IV SOLN
300.0000 mg | Freq: Once | INTRAVENOUS | Status: AC
  Administered 2018-01-16: 300 mg via INTRAVENOUS
  Filled 2018-01-16: qty 30

## 2018-01-16 NOTE — Patient Instructions (Signed)
Implanted Port Home Guide An implanted port is a type of central line that is placed under the skin. Central lines are used to provide IV access when treatment or nutrition needs to be given through a person's veins. Implanted ports are used for long-term IV access. An implanted port may be placed because:  You need IV medicine that would be irritating to the small veins in your hands or arms.  You need long-term IV medicines, such as antibiotics.  You need IV nutrition for a long period.  You need frequent blood draws for lab tests.  You need dialysis.  Implanted ports are usually placed in the chest area, but they can also be placed in the upper arm, the abdomen, or the leg. An implanted port has two main parts:  Reservoir. The reservoir is round and will appear as a small, raised area under your skin. The reservoir is the part where a needle is inserted to give medicines or draw blood.  Catheter. The catheter is a thin, flexible tube that extends from the reservoir. The catheter is placed into a large vein. Medicine that is inserted into the reservoir goes into the catheter and then into the vein.  How will I care for my incision site? Do not get the incision site wet. Bathe or shower as directed by your health care provider. How is my port accessed? Special steps must be taken to access the port:  Before the port is accessed, a numbing cream can be placed on the skin. This helps numb the skin over the port site.  Your health care provider uses a sterile technique to access the port. ? Your health care provider must put on a mask and sterile gloves. ? The skin over your port is cleaned carefully with an antiseptic and allowed to dry. ? The port is gently pinched between sterile gloves, and a needle is inserted into the port.  Only "non-coring" port needles should be used to access the port. Once the port is accessed, a blood return should be checked. This helps ensure that the port  is in the vein and is not clogged.  If your port needs to remain accessed for a constant infusion, a clear (transparent) bandage will be placed over the needle site. The bandage and needle will need to be changed every week, or as directed by your health care provider.  Keep the bandage covering the needle clean and dry. Do not get it wet. Follow your health care provider's instructions on how to take a shower or bath while the port is accessed.  If your port does not need to stay accessed, no bandage is needed over the port.  What is flushing? Flushing helps keep the port from getting clogged. Follow your health care provider's instructions on how and when to flush the port. Ports are usually flushed with saline solution or a medicine called heparin. The need for flushing will depend on how the port is used.  If the port is used for intermittent medicines or blood draws, the port will need to be flushed: ? After medicines have been given. ? After blood has been drawn. ? As part of routine maintenance.  If a constant infusion is running, the port may not need to be flushed.  How long will my port stay implanted? The port can stay in for as long as your health care provider thinks it is needed. When it is time for the port to come out, surgery will be   done to remove it. The procedure is similar to the one performed when the port was put in. When should I seek immediate medical care? When you have an implanted port, you should seek immediate medical care if:  You notice a bad smell coming from the incision site.  You have swelling, redness, or drainage at the incision site.  You have more swelling or pain at the port site or the surrounding area.  You have a fever that is not controlled with medicine.  This information is not intended to replace advice given to you by your health care provider. Make sure you discuss any questions you have with your health care provider. Document  Released: 10/11/2005 Document Revised: 03/18/2016 Document Reviewed: 06/18/2013 Elsevier Interactive Patient Education  2017 [employer]vier [employer].  

## 2018-01-16 NOTE — Progress Notes (Signed)
Patient Care Team: Patient, [name] as PCP - General ([place]) Neldon Mc, MD as Surgeon (General Surgery) Everardo All, MD (Hematology and Oncology)  DIAGNOSIS:  Encounter Diagnosis  Name Primary?  . 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, unspecified estrogen receptor status ([place])     SUMMARY OF ONCOLOGIC HISTORY:   Breast cancer, IDC, Left, Stage III, Triple negative   06/30/2011 Initial Diagnosis    Breast cancer, IDC, Left, Stage III, Triple negative, 7.6 cm breast mass and palpable axillary mass      09/06/2011 Miscellaneous    BRCA 1 and 2: Negative      09/14/2011 - 11/23/2011 Neo-Adjuvant Chemotherapy    Dose dense FEC followed by dose dense Taxotere      12/21/2011 Surgery    Left lumpectomy: High-grade poorly differentiated IDC 1.7 cm with high-grade DCIS, margins negative, 3/7 lymph nodes positive, ER 0%, PR 0%, HER-2 negative ratio 1.33 T1CN1 stage IIb      12/31/2011 - 02/15/2012 Radiation Therapy    Radiation at [facility]      09/26/2017 Relapse/Recurrence    Left breast upper outer quadrant within the lumpectomy bed: Fibrosis no malignancy, right axillary lymph node biopsy metastatic high-grade carcinoma ER 0%, PR 0%, Ki-67 90%, HER-2 negative ratio 1.11 (similar to previous ductal carcinoma)      11/14/2017 -  Neo-Adjuvant Chemotherapy    Neo-Adjuvant chemotherapy with Gemzar and carboplatin days 1 and 8 every 3 weeks      01/10/2018 Genetic Testing    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  The Hereditary Gene Panel offered by [facility] includes sequencing and/or deletion duplication testing of the following 47 genes: APC, ATM, AXIN2, BARD1, BMPR1A, BRCA1, BRCA2, BRIP1, CDH1, CDK4, CDKN2A (p14ARF), CDKN2A (p16INK4a), CHEK2, CTNNA1, DICER1, EPCAM (Deletion/duplication testing only), GREM1 (promoter region deletion/duplication testing only), KIT, MEN1, MLH1, MSH2, MSH3, MSH6, MUTYH, NBN,  NF1, NHTL1, PALB2, PDGFRA, PMS2, POLD1, POLE, PTEN, RAD50, RAD51C, RAD51D, SDHB, SDHC, SDHD, SMAD4, SMARCA4. STK11, TP53, TSC1, TSC2, and VHL.  The following genes were evaluated for sequence changes only: SDHA and HOXB13 c.251G>A variant only. The report date is January 10, 2018.        CHIEF COMPLIANT: Cycle 4 carboplatin and gemcitabine neoadjuvant chemo  INTERVAL HISTORY: Jessica Simpson is a 47 year old with above-mentioned history of relapsed left breast cancer currently neoadjuvant chemotherapy and today is cycle 4-day 1 of carboplatin and gemcitabine.   REVIEW OF SYSTEMS:   Constitutional: Denies fevers, chills or abnormal weight loss Eyes: Denies blurriness of vision Ears, nose, mouth, throat, and face: Denies mucositis or sore throat Respiratory: Denies cough, dyspnea or wheezes Cardiovascular: Denies palpitation, chest discomfort Gastrointestinal:  Denies nausea, heartburn or change in bowel habits Skin: Denies abnormal skin rashes Lymphatics: Denies new lymphadenopathy or easy bruising Neurological:Denies numbness, tingling or new weaknesses Behavioral/Psych: Mood is stable, no new changes  Extremities: No lower extremity edema  All other systems were reviewed with the patient and are negative.  I have reviewed the past medical history, past surgical history, social history and family history with the patient and they are unchanged from previous note.  ALLERGIES:  is allergic to codeine and lortab [hydrocodone-acetaminophen].  MEDICATIONS:  Current Outpatient Medications  Medication Sig Dispense Refill  . acetaminophen (TYLENOL) 500 MG tablet Take 500-1,000 mg by mouth every 6 (six) hours as needed. For pain    . amLODipine (NORVASC) 5 MG tablet Take 5 mg by mouth daily.  6  . Cholecalciferol 5000 units capsule Take 1 capsule (5,000 Units total) by mouth daily.    Marland Kitchen lidocaine-prilocaine (EMLA) cream Apply to affected area once 30 g 3  . loratadine (CLARITIN) 10 MG  tablet Take 10 mg by mouth daily as needed (for seasonal allergies (Spring)).    Marland Kitchen LORazepam (ATIVAN) 0.5 MG tablet Take 1 tablet (0.5 mg total) by mouth at bedtime as needed for sleep. 30 tablet 0  . ondansetron (ZOFRAN) 8 MG tablet Take 1 tablet (8 mg total) by mouth 2 (two) times daily as needed for refractory nausea / vomiting. Start on day 3 after chemotherapy. 30 tablet 1  . prochlorperazine (COMPAZINE) 10 MG tablet Take 1 tablet (10 mg total) by mouth every 6 (six) hours as needed (Nausea or vomiting). 30 tablet 1  . traMADol (ULTRAM) 50 MG tablet Take 2 tablets (100 mg total) by mouth every 6 (six) hours as needed. 10 tablet 0   No current facility-administered medications for this visit.    Facility-Administered Medications Ordered in Other Visits  Medication Dose Route Frequency Provider Last Rate Last Dose  . lactated ringers infusion    Continuous PRN Kerby Less, CRNA        PHYSICAL EXAMINATION: ECOG PERFORMANCE STATUS: 1 - Symptomatic but completely ambulatory  Vitals:   01/16/18 1148  BP: (!) 187/106  Pulse: 87  Resp: 18  Temp: 98.4 F (36.9 C)  SpO2: 100%   Filed Weights   01/16/18 1148  Weight: 223 lb 4.8 oz (101.3 kg)    GENERAL:alert, no distress and comfortable SKIN: skin color, texture, turgor are normal, no rashes or significant lesions EYES: normal, Conjunctiva are pink and non-injected, sclera clear OROPHARYNX:no exudate, no erythema and lips, buccal mucosa, and tongue normal  NECK: supple, thyroid normal size, non-tender, without nodularity LYMPH:  no palpable lymphadenopathy in the cervical, axillary or inguinal LUNGS: clear to auscultation and percussion with normal breathing effort HEART: regular rate & rhythm and no murmurs and no lower extremity edema ABDOMEN:abdomen soft, non-tender and normal bowel sounds MUSCULOSKELETAL:no cyanosis of digits and no clubbing  NEURO: alert & oriented x 3 with fluent speech, no focal motor/sensory  deficits EXTREMITIES: No lower extremity edema  LABORATORY DATA:  I have reviewed the data as listed CMP Latest Ref Rng & Units 01/02/2018 12/26/2017 12/19/2017  Glucose 70 - 140 mg/dL 99 76 98  BUN 7 - 26 mg/dL 13 12 12   Creatinine 0.60 - 1.10 mg/dL 0.81 0.86 1.02  Sodium 136 - 145 mmol/L 141 139 142  Potassium 3.5 - 5.1 mmol/L 3.7 3.9 3.6  Chloride 98 - 109 mmol/L 107 104 105  CO2 22 - 29 mmol/L 26 26 27   Calcium 8.4 - 10.4 mg/dL 9.5 9.8 9.9  Total Protein 6.4 - 8.3 g/dL 7.4 7.7 7.5  Total Bilirubin 0.2 - 1.2 mg/dL 0.3 0.3 <0.2(L)  Alkaline Phos 40 - 150 U/L 103 149 208(H)  AST 5 - 34 U/L 30 26 25   ALT 0 - 55 U/L 79(H) 61(H) 63(H)    Lab Results  Component Value Date   WBC 6.5 01/16/2018   HGB 12.6 11/09/2017   HCT 29.4 (L) 01/16/2018   MCV 83.8 01/16/2018   PLT 222 01/16/2018   NEUTROABS 4.2 01/16/2018    ASSESSMENT & PLAN:  Breast cancer, IDC, Left, Stage III, Triple negative 09/26/2017: Left breast upper outer quadrant within the lumpectomy bed: Fibrosis no malignancy, right axillary lymph node biopsy metastatic high-grade carcinoma ER 0%, PR  0%, Ki-67 90%, HER-2 negative ratio 1.11 (similar to previous ductal carcinoma)  Current treatment:Carboplatin and gemcitabine days 1 and 8 every 3 weeks x6 cycles adjuvant therapy. Today is cycle4day 1  Chemo toxicities: 1.Fatigue 2.Nausea 3.Constipation  4.  Low neutrophil count: We will add Neupogen on the Saturday (day 6).  We scheduled the patient for a mammogram and ultrasound at the breast center but it has not been done.  We will try to reschedule it again.  Patient is very concerned that the tumor may not be shrinking.  Return to clinic in 3 weeks for cycle 5 I spent 25 minutes talking to the patient of which more than half was spent in counseling and coordination of care.  No orders of the defined types were placed in this encounter.  The patient has a good understanding of the overall plan. she agrees with  it. she will call with any problems that may develop before the next visit here.   Harriette Ohara, MD 01/16/18

## 2018-01-16 NOTE — Patient Instructions (Signed)
[facility] Discharge Instructions for Patients Receiving Chemotherapy  Today you received the following chemotherapy agents:  Gemzar, Carboplatin  To help prevent nausea and vomiting after your treatment, we encourage you to take your nausea medication as prescribed.   If you develop nausea and vomiting that is not controlled by your nausea medication, call the clinic.   BELOW ARE SYMPTOMS THAT SHOULD BE REPORTED IMMEDIATELY:  *FEVER GREATER THAN 100.5 F  *CHILLS WITH OR WITHOUT FEVER  NAUSEA AND VOMITING THAT IS NOT CONTROLLED WITH YOUR NAUSEA MEDICATION  *UNUSUAL SHORTNESS OF BREATH  *UNUSUAL BRUISING OR BLEEDING  TENDERNESS IN MOUTH AND THROAT WITH OR WITHOUT PRESENCE OF ULCERS  *URINARY PROBLEMS  *BOWEL PROBLEMS  UNUSUAL RASH Items with * indicate a potential emergency and should be followed up as soon as possible.  Feel free to call the clinic should you have any questions or concerns. The clinic phone number is [phone].  Please show the CHEMO ALERT CARD at check-in to the Emergency Department and triage nurse.   

## 2018-01-16 NOTE — Assessment & Plan Note (Signed)
09/26/2017: Left breast upper outer quadrant within the lumpectomy bed: Fibrosis no malignancy, right axillary lymph node biopsy metastatic high-grade carcinoma ER 0%, PR 0%, Ki-67 90%, HER-2 negative ratio 1.11 (similar to previous ductal carcinoma)  Current treatment:Carboplatin and gemcitabine days 1 and 8 every 3 weeks x6 cycles adjuvant therapy. Today is cycle4day 1  Chemo toxicities: 1.Fatigue 2.Nausea 3.Constipation  4.  Low neutrophil count: We will add Neupogen on the Saturday before day 8.

## 2018-01-16 NOTE — Telephone Encounter (Signed)
Gave avs and calendar ° °

## 2018-01-20 ENCOUNTER — Other Ambulatory Visit: Payer: Self-pay | Admitting: Hematology and Oncology

## 2018-01-20 DIAGNOSIS — C50012 Malignant neoplasm of nipple and areola, left female breast: Secondary | ICD-10-CM

## 2018-01-21 ENCOUNTER — Inpatient Hospital Stay: Payer: BLUE CROSS/BLUE SHIELD

## 2018-01-21 VITALS — BP 142/87 | HR 77 | Temp 98.1°F | Resp 18

## 2018-01-21 DIAGNOSIS — C50412 Malignant neoplasm of upper-outer quadrant of left female breast: Secondary | ICD-10-CM | POA: Diagnosis not present

## 2018-01-21 DIAGNOSIS — N6331 Unspecified lump in axillary tail of the right breast: Secondary | ICD-10-CM

## 2018-01-21 MED ORDER — TBO-FILGRASTIM 480 MCG/0.8ML ~~LOC~~ SOSY
PREFILLED_SYRINGE | SUBCUTANEOUS | Status: AC
Start: 2018-01-21 — End: 2018-01-21
  Filled 2018-01-21: qty 0.8

## 2018-01-21 MED ORDER — TBO-FILGRASTIM 480 MCG/0.8ML ~~LOC~~ SOSY
480.0000 ug | PREFILLED_SYRINGE | Freq: Once | SUBCUTANEOUS | Status: AC
  Administered 2018-01-21: 480 ug via SUBCUTANEOUS

## 2018-01-21 NOTE — Patient Instructions (Signed)
Tbo-Filgrastim injection What is this medicine? TBO-FILGRASTIM (T B O fil GRA stim) is a granulocyte colony-stimulating factor that stimulates the growth of neutrophils, a type of white blood cell important in the body's fight against infection. It is used to reduce the incidence of fever and infection in patients with certain types of cancer who are receiving chemotherapy that affects the bone marrow. This medicine may be used for other purposes; ask your health care provider or pharmacist if you have questions. COMMON BRAND NAME(S): Granix What should I tell my health care provider before I take this medicine? They need to know if you have any of these conditions: -bone scan or tests planned -kidney disease -sickle cell anemia -an unusual or allergic reaction to tbo-filgrastim, filgrastim, pegfilgrastim, other medicines, foods, dyes, or preservatives -pregnant or trying to get pregnant -breast-feeding How should I use this medicine? This medicine is for injection under the skin. If you get this medicine at home, you will be taught how to prepare and give this medicine. Refer to the Instructions for Use that come with your medication packaging. Use exactly as directed. Take your medicine at regular intervals. Do not take your medicine more often than directed. It is important that you put your used needles and syringes in a special sharps container. Do not put them in a trash can. If you do not have a sharps container, call your pharmacist or healthcare provider to get one. Talk to your pediatrician regarding the use of this medicine in children. Special care may be needed. Overdosage: If you think you have taken too much of this medicine contact a poison control center or emergency room at once. NOTE: This medicine is only for you. Do not share this medicine with others. What if I miss a dose? It is important not to miss your dose. Call your doctor or health care professional if you miss a  dose. What may interact with this medicine? This medicine may interact with the following medications: -medicines that may cause a release of neutrophils, such as lithium This list may not describe all possible interactions. Give your health care provider a list of all the medicines, herbs, non-prescription drugs, or dietary supplements you use. Also tell them if you smoke, drink alcohol, or use illegal drugs. Some items may interact with your medicine. What should I watch for while using this medicine? You may need blood work done while you are taking this medicine. What side effects may I notice from receiving this medicine? Side effects that you should report to your doctor or health care professional as soon as possible: -allergic reactions like skin rash, itching or hives, swelling of the face, lips, or tongue -blood in the urine -dark urine -dizziness -fast heartbeat -feeling faint -shortness of breath or breathing problems -signs and symptoms of infection like fever or chills; cough; or sore throat -signs and symptoms of kidney injury like trouble passing urine or change in the amount of urine -stomach or side pain, or pain at the shoulder -sweating -swelling of the legs, ankles, or abdomen -tiredness Side effects that usually do not require medical attention (report to your doctor or health care professional if they continue or are bothersome): -bone pain -headache -muscle pain -vomiting This list may not describe all possible side effects. Call your doctor for medical advice about side effects. You may report side effects to FDA at 1-800-FDA-1088. Where should I keep my medicine? Keep out of the reach of children. Store in a refrigerator between   2 and 8 degrees C (36 and 46 degrees F). Keep in carton to protect from light. Throw away this medicine if it is left out of the refrigerator for more than 5 consecutive days. Throw away any unused medicine after the expiration  date. NOTE: This sheet is a summary. It may not cover all possible information. If you have questions about this medicine, talk to your doctor, pharmacist, or health care provider.  2018 Elsevier/Gold Standard (2015-12-01 19:07:04)  

## 2018-01-23 ENCOUNTER — Inpatient Hospital Stay: Payer: BLUE CROSS/BLUE SHIELD

## 2018-01-23 ENCOUNTER — Inpatient Hospital Stay: Payer: BLUE CROSS/BLUE SHIELD | Attending: Hematology and Oncology

## 2018-01-23 VITALS — BP 146/80 | HR 73 | Temp 98.0°F | Resp 18

## 2018-01-23 DIAGNOSIS — Z79899 Other long term (current) drug therapy: Secondary | ICD-10-CM | POA: Insufficient documentation

## 2018-01-23 DIAGNOSIS — Z171 Estrogen receptor negative status [ER-]: Secondary | ICD-10-CM | POA: Diagnosis not present

## 2018-01-23 DIAGNOSIS — C50012 Malignant neoplasm of nipple and areola, left female breast: Secondary | ICD-10-CM | POA: Insufficient documentation

## 2018-01-23 DIAGNOSIS — R5383 Other fatigue: Secondary | ICD-10-CM | POA: Insufficient documentation

## 2018-01-23 DIAGNOSIS — R110 Nausea: Secondary | ICD-10-CM | POA: Diagnosis not present

## 2018-01-23 DIAGNOSIS — Z5111 Encounter for antineoplastic chemotherapy: Secondary | ICD-10-CM | POA: Insufficient documentation

## 2018-01-23 DIAGNOSIS — N6331 Unspecified lump in axillary tail of the right breast: Secondary | ICD-10-CM

## 2018-01-23 DIAGNOSIS — C773 Secondary and unspecified malignant neoplasm of axilla and upper limb lymph nodes: Secondary | ICD-10-CM | POA: Diagnosis not present

## 2018-01-23 DIAGNOSIS — Z7689 Persons encountering health services in other specified circumstances: Secondary | ICD-10-CM | POA: Insufficient documentation

## 2018-01-23 DIAGNOSIS — Z95828 Presence of other vascular implants and grafts: Secondary | ICD-10-CM

## 2018-01-23 DIAGNOSIS — K5900 Constipation, unspecified: Secondary | ICD-10-CM | POA: Diagnosis not present

## 2018-01-23 LAB — CMP (CANCER CENTER ONLY)
ALK PHOS: 108 U/L (ref 40–150)
ALT: 57 U/L — AB (ref 0–55)
AST: 23 U/L (ref 5–34)
Albumin: 3.9 g/dL (ref 3.5–5.0)
Anion gap: 8 (ref 3–11)
BUN: 12 mg/dL (ref 7–26)
CALCIUM: 9.7 mg/dL (ref 8.4–10.4)
CHLORIDE: 106 mmol/L (ref 98–109)
CO2: 26 mmol/L (ref 22–29)
CREATININE: 0.83 mg/dL (ref 0.60–1.10)
GFR, Estimated: >60 mL/min (ref >60)
Glucose, Bld: 89 mg/dL (ref 70–140)
Potassium: 3.7 mmol/L (ref 3.5–5.1)
Sodium: 140 mmol/L (ref 136–145)
TOTAL PROTEIN: 7.4 g/dL (ref 6.4–8.3)
Total Bilirubin: <0.2 mg/dL — ABNORMAL LOW (ref 0.2–1.2)

## 2018-01-23 LAB — CBC WITH DIFFERENTIAL (CANCER CENTER ONLY)
BASOS PCT: 0 %
Basophils Absolute: 0 10*3/uL (ref 0.0–0.1)
EOS ABS: 0 10*3/uL (ref 0.0–0.5)
EOS PCT: 0 %
HCT: 28.1 % — ABNORMAL LOW (ref 34.8–46.6)
HEMOGLOBIN: 9.3 g/dL — AB (ref 11.6–15.9)
Lymphocytes Relative: 15 %
Lymphs Abs: 1.6 10*3/uL (ref 0.9–3.3)
MCH: 27.5 pg (ref 25.1–34.0)
MCHC: 33.2 g/dL (ref 31.5–36.0)
MCV: 82.6 fL (ref 79.5–101.0)
Monocytes Absolute: 0.5 10*3/uL (ref 0.1–0.9)
Monocytes Relative: 5 %
NEUTROS PCT: 80 %
Neutro Abs: 8.3 10*3/uL — ABNORMAL HIGH (ref 1.5–6.5)
PLATELETS: 314 10*3/uL (ref 145–400)
RBC: 3.4 MIL/uL — AB (ref 3.70–5.45)
RDW: 19.5 % — ABNORMAL HIGH (ref 11.2–14.5)
WBC Count: 10.5 10*3/uL — ABNORMAL HIGH (ref 3.9–10.3)

## 2018-01-23 MED ORDER — GEMCITABINE HCL CHEMO INJECTION 1 GM/26.3ML
800.0000 mg/m2 | Freq: Once | INTRAVENOUS | Status: AC
  Administered 2018-01-23: 1672 mg via INTRAVENOUS
  Filled 2018-01-23: qty 43.97

## 2018-01-23 MED ORDER — SODIUM CHLORIDE 0.9% FLUSH
10.0000 mL | INTRAVENOUS | Status: DC | PRN
  Administered 2018-01-23: 10 mL
  Filled 2018-01-23: qty 10

## 2018-01-23 MED ORDER — PALONOSETRON HCL INJECTION 0.25 MG/5ML
0.2500 mg | Freq: Once | INTRAVENOUS | Status: AC
  Administered 2018-01-23: 0.25 mg via INTRAVENOUS

## 2018-01-23 MED ORDER — SODIUM CHLORIDE 0.9 % IV SOLN
300.0000 mg | Freq: Once | INTRAVENOUS | Status: AC
  Administered 2018-01-23: 300 mg via INTRAVENOUS
  Filled 2018-01-23: qty 30

## 2018-01-23 MED ORDER — DEXAMETHASONE SODIUM PHOSPHATE 10 MG/ML IJ SOLN
10.0000 mg | Freq: Once | INTRAMUSCULAR | Status: AC
  Administered 2018-01-23: 10 mg via INTRAVENOUS

## 2018-01-23 MED ORDER — SODIUM CHLORIDE 0.9% FLUSH
10.0000 mL | Freq: Once | INTRAVENOUS | Status: AC
  Administered 2018-01-23: 10 mL
  Filled 2018-01-23: qty 10

## 2018-01-23 MED ORDER — PALONOSETRON HCL INJECTION 0.25 MG/5ML
INTRAVENOUS | Status: AC
  Filled 2018-01-23: qty 5

## 2018-01-23 MED ORDER — DEXAMETHASONE SODIUM PHOSPHATE 10 MG/ML IJ SOLN
INTRAMUSCULAR | Status: AC
  Filled 2018-01-23: qty 1

## 2018-01-23 MED ORDER — SODIUM CHLORIDE 0.9 % IV SOLN
Freq: Once | INTRAVENOUS | Status: AC
  Administered 2018-01-23: 12:00:00 via INTRAVENOUS

## 2018-01-23 MED ORDER — HEPARIN SOD (PORK) LOCK FLUSH 100 UNIT/ML IV SOLN
500.0000 [IU] | Freq: Once | INTRAVENOUS | Status: AC | PRN
Start: 2018-01-23 — End: 2018-01-23
  Administered 2018-01-23: 500 [IU]
  Filled 2018-01-23: qty 5

## 2018-01-23 NOTE — Patient Instructions (Signed)
[facility] Discharge Instructions for Patients Receiving Chemotherapy  Today you received the following chemotherapy agents Gemzar and Carboplatin   To help prevent nausea and vomiting after your treatment, we encourage you to take your nausea medication as directed.    If you develop nausea and vomiting that is not controlled by your nausea medication, call the clinic.   BELOW ARE SYMPTOMS THAT SHOULD BE REPORTED IMMEDIATELY:  *FEVER GREATER THAN 100.5 F  *CHILLS WITH OR WITHOUT FEVER  NAUSEA AND VOMITING THAT IS NOT CONTROLLED WITH YOUR NAUSEA MEDICATION  *UNUSUAL SHORTNESS OF BREATH  *UNUSUAL BRUISING OR BLEEDING  TENDERNESS IN MOUTH AND THROAT WITH OR WITHOUT PRESENCE OF ULCERS  *URINARY PROBLEMS  *BOWEL PROBLEMS  UNUSUAL RASH Items with * indicate a potential emergency and should be followed up as soon as possible.  Feel free to call the clinic should you have any questions or concerns. The clinic phone number is [phone].  Please show the CHEMO ALERT CARD at check-in to the Emergency Department and triage nurse.   

## 2018-01-25 ENCOUNTER — Ambulatory Visit
Admission: RE | Admit: 2018-01-25 | Discharge: 2018-01-25 | Disposition: A | Discharge status: Home / Self Care | Payer: BLUE CROSS/BLUE SHIELD | Source: Ambulatory Visit | Attending: Hematology and Oncology | Admitting: Hematology and Oncology

## 2018-01-25 ENCOUNTER — Inpatient Hospital Stay: Payer: BLUE CROSS/BLUE SHIELD

## 2018-01-25 VITALS — BP 156/73 | HR 78 | Temp 98.3°F | Resp 20

## 2018-01-25 DIAGNOSIS — N6331 Unspecified lump in axillary tail of the right breast: Secondary | ICD-10-CM

## 2018-01-25 DIAGNOSIS — C50012 Malignant neoplasm of nipple and areola, left female breast: Secondary | ICD-10-CM

## 2018-01-25 MED ORDER — PEGFILGRASTIM-CBQV 6 MG/0.6ML ~~LOC~~ SOSY
6.0000 mg | PREFILLED_SYRINGE | Freq: Once | SUBCUTANEOUS | Status: AC
  Administered 2018-01-25: 6 mg via SUBCUTANEOUS

## 2018-01-25 MED ORDER — PEGFILGRASTIM-CBQV 6 MG/0.6ML ~~LOC~~ SOSY
PREFILLED_SYRINGE | SUBCUTANEOUS | Status: AC
  Filled 2018-01-25: qty 0.6

## 2018-01-25 NOTE — Patient Instructions (Signed)
Tbo-Filgrastim injection What is this medicine? TBO-FILGRASTIM (T B O fil GRA stim) is a granulocyte colony-stimulating factor that stimulates the growth of neutrophils, a type of white blood cell important in the body's fight against infection. It is used to reduce the incidence of fever and infection in patients with certain types of cancer who are receiving chemotherapy that affects the bone marrow. This medicine may be used for other purposes; ask your health care provider or pharmacist if you have questions. COMMON BRAND NAME(S): Granix What should I tell my health care provider before I take this medicine? They need to know if you have any of these conditions: -bone scan or tests planned -kidney disease -sickle cell anemia -an unusual or allergic reaction to tbo-filgrastim, filgrastim, pegfilgrastim, other medicines, foods, dyes, or preservatives -pregnant or trying to get pregnant -breast-feeding How should I use this medicine? This medicine is for injection under the skin. If you get this medicine at home, you will be taught how to prepare and give this medicine. Refer to the Instructions for Use that come with your medication packaging. Use exactly as directed. Take your medicine at regular intervals. Do not take your medicine more often than directed. It is important that you put your used needles and syringes in a special sharps container. Do not put them in a trash can. If you do not have a sharps container, call your pharmacist or healthcare provider to get one. Talk to your pediatrician regarding the use of this medicine in children. Special care may be needed. Overdosage: If you think you have taken too much of this medicine contact a poison control center or emergency room at once. NOTE: This medicine is only for you. Do not share this medicine with others. What if I miss a dose? It is important not to miss your dose. Call your doctor or health care professional if you miss a  dose. What may interact with this medicine? This medicine may interact with the following medications: -medicines that may cause a release of neutrophils, such as lithium This list may not describe all possible interactions. Give your health care provider a list of all the medicines, herbs, non-prescription drugs, or dietary supplements you use. Also tell them if you smoke, drink alcohol, or use illegal drugs. Some items may interact with your medicine. What should I watch for while using this medicine? You may need blood work done while you are taking this medicine. What side effects may I notice from receiving this medicine? Side effects that you should report to your doctor or health care professional as soon as possible: -allergic reactions like skin rash, itching or hives, swelling of the face, lips, or tongue -blood in the urine -dark urine -dizziness -fast heartbeat -feeling faint -shortness of breath or breathing problems -signs and symptoms of infection like fever or chills; cough; or sore throat -signs and symptoms of kidney injury like trouble passing urine or change in the amount of urine -stomach or side pain, or pain at the shoulder -sweating -swelling of the legs, ankles, or abdomen -tiredness Side effects that usually do not require medical attention (report to your doctor or health care professional if they continue or are bothersome): -bone pain -headache -muscle pain -vomiting This list may not describe all possible side effects. Call your doctor for medical advice about side effects. You may report side effects to FDA at 1-800-FDA-1088. Where should I keep my medicine? Keep out of the reach of children. Store in a refrigerator between   2 and 8 degrees C (36 and 46 degrees F). Keep in carton to protect from light. Throw away this medicine if it is left out of the refrigerator for more than 5 consecutive days. Throw away any unused medicine after the expiration  date. NOTE: This sheet is a summary. It may not cover all possible information. If you have questions about this medicine, talk to your doctor, pharmacist, or health care provider.  2018 Elsevier/Gold Standard (2015-12-01 19:07:04)  

## 2018-02-03 ENCOUNTER — Telehealth: Payer: Self-pay | Admitting: Hematology and Oncology

## 2018-02-03 NOTE — Telephone Encounter (Signed)
Spoke to patient regarding upcoming April appointments per 4/11 sch message.

## 2018-02-06 ENCOUNTER — Inpatient Hospital Stay (HOSPITAL_BASED_OUTPATIENT_CLINIC_OR_DEPARTMENT_OTHER): Payer: BLUE CROSS/BLUE SHIELD | Admitting: Hematology and Oncology

## 2018-02-06 ENCOUNTER — Inpatient Hospital Stay: Payer: BLUE CROSS/BLUE SHIELD

## 2018-02-06 DIAGNOSIS — C773 Secondary and unspecified malignant neoplasm of axilla and upper limb lymph nodes: Secondary | ICD-10-CM

## 2018-02-06 DIAGNOSIS — C50012 Malignant neoplasm of nipple and areola, left female breast: Secondary | ICD-10-CM

## 2018-02-06 DIAGNOSIS — Z95828 Presence of other vascular implants and grafts: Secondary | ICD-10-CM

## 2018-02-06 DIAGNOSIS — Z7689 Persons encountering health services in other specified circumstances: Secondary | ICD-10-CM

## 2018-02-06 DIAGNOSIS — Z171 Estrogen receptor negative status [ER-]: Secondary | ICD-10-CM

## 2018-02-06 DIAGNOSIS — K5900 Constipation, unspecified: Secondary | ICD-10-CM

## 2018-02-06 DIAGNOSIS — N6331 Unspecified lump in axillary tail of the right breast: Secondary | ICD-10-CM

## 2018-02-06 DIAGNOSIS — R5383 Other fatigue: Secondary | ICD-10-CM | POA: Diagnosis not present

## 2018-02-06 DIAGNOSIS — Z79899 Other long term (current) drug therapy: Secondary | ICD-10-CM

## 2018-02-06 DIAGNOSIS — Z5111 Encounter for antineoplastic chemotherapy: Secondary | ICD-10-CM

## 2018-02-06 DIAGNOSIS — R110 Nausea: Secondary | ICD-10-CM

## 2018-02-06 LAB — CBC WITH DIFFERENTIAL (CANCER CENTER ONLY)
BASOS ABS: 0 10*3/uL (ref 0.0–0.1)
BASOS PCT: 0 %
Eosinophils Absolute: 0.1 10*3/uL (ref 0.0–0.5)
Eosinophils Relative: 1 %
HEMATOCRIT: 29.6 % — AB (ref 34.8–46.6)
HEMOGLOBIN: 9.5 g/dL — AB (ref 11.6–15.9)
LYMPHS PCT: 17 %
Lymphs Abs: 1.7 10*3/uL (ref 0.9–3.3)
MCH: 27.7 pg (ref 25.1–34.0)
MCHC: 32.1 g/dL (ref 31.5–36.0)
MCV: 86.3 fL (ref 79.5–101.0)
Monocytes Absolute: 1 10*3/uL — ABNORMAL HIGH (ref 0.1–0.9)
Monocytes Relative: 10 %
NEUTROS ABS: 7.4 10*3/uL — AB (ref 1.5–6.5)
Neutrophils Relative %: 72 %
Platelet Count: 205 10*3/uL (ref 145–400)
RBC: 3.43 MIL/uL — AB (ref 3.70–5.45)
RDW: 19.9 % — AB (ref 11.2–14.5)
WBC Count: 10.2 10*3/uL (ref 3.9–10.3)
nRBC: 2 /100 WBC — ABNORMAL HIGH

## 2018-02-06 LAB — CMP (CANCER CENTER ONLY)
ALK PHOS: 139 U/L (ref 40–150)
ALT: 57 U/L — AB (ref 0–55)
AST: 26 U/L (ref 5–34)
Albumin: 4 g/dL (ref 3.5–5.0)
Anion gap: 9 (ref 3–11)
BUN: 14 mg/dL (ref 7–26)
CALCIUM: 9.8 mg/dL (ref 8.4–10.4)
CHLORIDE: 106 mmol/L (ref 98–109)
CO2: 26 mmol/L (ref 22–29)
CREATININE: 1.05 mg/dL (ref 0.60–1.10)
GFR, Estimated: >60 mL/min (ref >60)
Glucose, Bld: 99 mg/dL (ref 70–140)
Potassium: 3.7 mmol/L (ref 3.5–5.1)
Sodium: 141 mmol/L (ref 136–145)
Total Bilirubin: 0.2 mg/dL (ref 0.2–1.2)
Total Protein: 7.5 g/dL (ref 6.4–8.3)

## 2018-02-06 MED ORDER — SODIUM CHLORIDE 0.9% FLUSH
10.0000 mL | Freq: Once | INTRAVENOUS | Status: AC
  Administered 2018-02-06: 10 mL
  Filled 2018-02-06: qty 10

## 2018-02-06 MED ORDER — HEPARIN SOD (PORK) LOCK FLUSH 100 UNIT/ML IV SOLN
500.0000 [IU] | Freq: Once | INTRAVENOUS | Status: AC | PRN
  Administered 2018-02-06: 500 [IU]
  Filled 2018-02-06: qty 5

## 2018-02-06 MED ORDER — PALONOSETRON HCL INJECTION 0.25 MG/5ML
0.2500 mg | Freq: Once | INTRAVENOUS | Status: AC
  Administered 2018-02-06: 0.25 mg via INTRAVENOUS

## 2018-02-06 MED ORDER — DEXAMETHASONE SODIUM PHOSPHATE 10 MG/ML IJ SOLN
10.0000 mg | Freq: Once | INTRAMUSCULAR | Status: AC
  Administered 2018-02-06: 10 mg via INTRAVENOUS

## 2018-02-06 MED ORDER — PALONOSETRON HCL INJECTION 0.25 MG/5ML
INTRAVENOUS | Status: AC
  Filled 2018-02-06: qty 5

## 2018-02-06 MED ORDER — CARBOPLATIN CHEMO INJECTION 450 MG/45ML
253.0000 mg | Freq: Once | INTRAVENOUS | Status: AC
  Administered 2018-02-06: 250 mg via INTRAVENOUS
  Filled 2018-02-06: qty 25

## 2018-02-06 MED ORDER — DEXAMETHASONE SODIUM PHOSPHATE 10 MG/ML IJ SOLN
INTRAMUSCULAR | Status: AC
  Filled 2018-02-06: qty 1

## 2018-02-06 MED ORDER — SODIUM CHLORIDE 0.9% FLUSH
10.0000 mL | INTRAVENOUS | Status: DC | PRN
  Administered 2018-02-06: 10 mL
  Filled 2018-02-06: qty 10

## 2018-02-06 MED ORDER — SODIUM CHLORIDE 0.9 % IV SOLN
Freq: Once | INTRAVENOUS | Status: AC
  Administered 2018-02-06: 13:00:00 via INTRAVENOUS

## 2018-02-06 MED ORDER — SODIUM CHLORIDE 0.9 % IV SOLN
800.0000 mg/m2 | Freq: Once | INTRAVENOUS | Status: AC
  Administered 2018-02-06: 1672 mg via INTRAVENOUS
  Filled 2018-02-06: qty 43.97

## 2018-02-06 NOTE — Progress Notes (Signed)
Patient Care Team: Patient, [name] as PCP - General ([place]) Neldon Mc, MD as Surgeon (General Surgery) Everardo All, MD (Hematology and Oncology)  DIAGNOSIS:  Encounter Diagnosis  Name Primary?  . 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, unspecified estrogen receptor status ([address])     SUMMARY OF ONCOLOGIC HISTORY:   Breast cancer, IDC, Left, Stage III, Triple negative   06/30/2011 Initial Diagnosis    Breast cancer, IDC, Left, Stage III, Triple negative, 7.6 cm breast mass and palpable axillary mass      09/06/2011 Miscellaneous    BRCA 1 and 2: Negative      09/14/2011 - 11/23/2011 Neo-Adjuvant Chemotherapy    Dose dense FEC followed by dose dense Taxotere      12/21/2011 Surgery    Left lumpectomy: High-grade poorly differentiated IDC 1.7 cm with high-grade DCIS, margins negative, 3/7 lymph nodes positive, ER 0%, PR 0%, HER-2 negative ratio 1.33 T1CN1 stage IIb      12/31/2011 - 02/15/2012 Radiation Therapy    Radiation at [facility]      09/26/2017 Relapse/Recurrence    Left breast upper outer quadrant within the lumpectomy bed: Fibrosis no malignancy, right axillary lymph node biopsy metastatic high-grade carcinoma ER 0%, PR 0%, Ki-67 90%, HER-2 negative ratio 1.11 (similar to previous ductal carcinoma)      11/14/2017 -  Neo-Adjuvant Chemotherapy    Neo-Adjuvant chemotherapy with Gemzar and carboplatin days 1 and 8 every 3 weeks      01/10/2018 Genetic Testing    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  The Hereditary Gene Panel offered by [facility] includes sequencing and/or deletion duplication testing of the following 47 genes: APC, ATM, AXIN2, BARD1, BMPR1A, BRCA1, BRCA2, BRIP1, CDH1, CDK4, CDKN2A (p14ARF), CDKN2A (p16INK4a), CHEK2, CTNNA1, DICER1, EPCAM (Deletion/duplication testing only), GREM1 (promoter region deletion/duplication testing only), KIT, MEN1, MLH1, MSH2, MSH3, MSH6, MUTYH, NBN,  NF1, NHTL1, PALB2, PDGFRA, PMS2, POLD1, POLE, PTEN, RAD50, RAD51C, RAD51D, SDHB, SDHC, SDHD, SMAD4, SMARCA4. STK11, TP53, TSC1, TSC2, and VHL.  The following genes were evaluated for sequence changes only: SDHA and HOXB13 c.251G>A variant only. The report date is January 10, 2018.        CHIEF COMPLIANT: Cycle 5 carboplatin gemcitabine  INTERVAL HISTORY: Jessica Simpson is a 47 year old with above-mentioned history of left breast cancer currently on neoadjuvant chemotherapy with carboplatin and gemcitabine.  Today is cycle 5 of treatment.  She had a mammogram and ultrasound which showed a only a slight decrease in the right axillary lymph node.  She is tolerating chemo extremely well.  Does not have any nausea vomiting she did have leukopenia for which we added Neupogen the week before each treatment and neulasta  REVIEW OF SYSTEMS:   Constitutional: Denies fevers, chills or abnormal weight loss Eyes: Denies blurriness of vision Ears, nose, mouth, throat, and face: Denies mucositis or sore throat Respiratory: Denies cough, dyspnea or wheezes Cardiovascular: Denies palpitation, chest discomfort Gastrointestinal:  Denies nausea, heartburn or change in bowel habits Skin: Denies abnormal skin rashes Lymphatics: Denies new lymphadenopathy or easy bruising Neurological:Denies numbness, tingling or new weaknesses Behavioral/Psych: Mood is stable, no new changes  Extremities: No lower extremity edema  All other systems were reviewed with the patient and are negative.  I have reviewed the past medical history, past surgical history, social history and family history with the patient and they are unchanged from previous note.  ALLERGIES:  is allergic to codeine and lortab [hydrocodone-acetaminophen].  MEDICATIONS:  Current Outpatient Medications  Medication Sig Dispense Refill  . acetaminophen (TYLENOL) 500 MG tablet Take 500-1,000 mg by mouth every 6 (six) hours as needed. For pain    .  amLODipine (NORVASC) 5 MG tablet Take 5 mg by mouth daily.  6  . Cholecalciferol 5000 units capsule Take 1 capsule (5,000 Units total) by mouth daily.    Marland Kitchen lidocaine-prilocaine (EMLA) cream Apply to affected area once 30 g 3  . loratadine (CLARITIN) 10 MG tablet Take 10 mg by mouth daily as needed (for seasonal allergies (Spring)).    Marland Kitchen LORazepam (ATIVAN) 0.5 MG tablet Take 1 tablet (0.5 mg total) by mouth at bedtime as needed for sleep. 30 tablet 0  . ondansetron (ZOFRAN) 8 MG tablet Take 1 tablet (8 mg total) by mouth 2 (two) times daily as needed for refractory nausea / vomiting. Start on day 3 after chemotherapy. 30 tablet 1  . prochlorperazine (COMPAZINE) 10 MG tablet Take 1 tablet (10 mg total) by mouth every 6 (six) hours as needed (Nausea or vomiting). 30 tablet 1  . traMADol (ULTRAM) 50 MG tablet Take 2 tablets (100 mg total) by mouth every 6 (six) hours as needed. 10 tablet 0   No current facility-administered medications for this visit.    Facility-Administered Medications Ordered in Other Visits  Medication Dose Route Frequency Provider Last Rate Last Dose  . lactated ringers infusion    Continuous PRN Kerby Less, CRNA        PHYSICAL EXAMINATION: ECOG PERFORMANCE STATUS: 1 - Symptomatic but completely ambulatory  Vitals:   02/06/18 1149  BP: (!) 149/89  Pulse: 88  Resp: 19  Temp: 98.4 F (36.9 C)  SpO2: 100%   Filed Weights   02/06/18 1149  Weight: 223 lb 12.8 oz (101.5 kg)    GENERAL:alert, no distress and comfortable SKIN: skin color, texture, turgor are normal, no rashes or significant lesions EYES: normal, Conjunctiva are pink and non-injected, sclera clear OROPHARYNX:no exudate, no erythema and lips, buccal mucosa, and tongue normal  NECK: supple, thyroid normal size, non-tender, without nodularity LYMPH:  no palpable lymphadenopathy in the cervical, axillary or inguinal LUNGS: clear to auscultation and percussion with normal breathing effort HEART:  regular rate & rhythm and no murmurs and no lower extremity edema ABDOMEN:abdomen soft, non-tender and normal bowel sounds MUSCULOSKELETAL:no cyanosis of digits and no clubbing  NEURO: alert & oriented x 3 with fluent speech, no focal motor/sensory deficits EXTREMITIES: No lower extremity edema  LABORATORY DATA:  I have reviewed the data as listed CMP Latest Ref Rng & Units 02/06/2018 01/23/2018 01/16/2018  Glucose 70 - 140 mg/dL 99 89 115  BUN 7 - 26 mg/dL 14 12 12   Creatinine 0.60 - 1.10 mg/dL 1.05 0.83 0.85  Sodium 136 - 145 mmol/L 141 140 140  Potassium 3.5 - 5.1 mmol/L 3.7 3.7 3.5  Chloride 98 - 109 mmol/L 106 106 106  CO2 22 - 29 mmol/L 26 26 26   Calcium 8.4 - 10.4 mg/dL 9.8 9.7 9.4  Total Protein 6.4 - 8.3 g/dL 7.5 7.4 7.1  Total Bilirubin 0.2 - 1.2 mg/dL 0.2 <0.2(L) <0.2(L)  Alkaline Phos 40 - 150 U/L 139 108 118  AST 5 - 34 U/L 26 23 22   ALT 0 - 55 U/L 57(H) 57(H) 41    Lab Results  Component Value Date   WBC 10.2 02/06/2018   HGB 12.6 11/09/2017   HCT 29.6 (L) 02/06/2018   MCV 86.3 02/06/2018   PLT  205 02/06/2018   NEUTROABS 7.4 (H) 02/06/2018    ASSESSMENT & PLAN:  Breast cancer, IDC, Left, Stage III, Triple negative 09/26/2017: Left breast upper outer quadrant within the lumpectomy bed: Fibrosis no malignancy, right axillary lymph node biopsy metastatic high-grade carcinoma ER 0%, PR 0%, Ki-67 90%, HER-2 negative ratio 1.11 (similar to previous ductal carcinoma)  Current treatment:Carboplatin and gemcitabine days 1 and 8 every 3 weeks x6 cycles adjuvant therapy. Today is cycle5day 1  Chemo toxicities: 1.Fatigue 2.Nausea 3.Constipation  4.  Low neutrophil count: We will add Neupogen on the Saturday (day 6).  Mammogram and ultrasound after 4 cycles of treatment: Interval reduction in the size of the right axillary lymph node 1.9 x 0.8 x 1.4 cm previously it was 2 x 1.1 x 1.8 cm I discussed with the patient that the degree of reduction in the lymph node  size is not dramatic.  She has at least a 35% reduction in the tumor.  I discussed with her that we may need to keep the port even after the conclusion of chemotherapy because there is a possibility that she may need additional chemotherapy after the surgery.    Regarding surgery, patient wants to go on a vacation to the [place] after the chemo is completed.  She will plan to do so in the first week of June. Return to clinic in 3 weeks for cycle 6 after which she will need to undergo surgery.  No orders of the defined types were placed in this encounter.  The patient has a good understanding of the overall plan. she agrees with it. she will call with any problems that may develop before the next visit here.   Harriette Ohara, MD 02/06/18

## 2018-02-06 NOTE — Patient Instructions (Signed)
[facility] Discharge Instructions for Patients Receiving Chemotherapy  Today you received the following chemotherapy agents gemzar/carboplatin  To help prevent nausea and vomiting after your treatment, we encourage you to take your nausea medication as directed   If you develop nausea and vomiting that is not controlled by your nausea medication, call the clinic.   BELOW ARE SYMPTOMS THAT SHOULD BE REPORTED IMMEDIATELY:  *FEVER GREATER THAN 100.5 F  *CHILLS WITH OR WITHOUT FEVER  NAUSEA AND VOMITING THAT IS NOT CONTROLLED WITH YOUR NAUSEA MEDICATION  *UNUSUAL SHORTNESS OF BREATH  *UNUSUAL BRUISING OR BLEEDING  TENDERNESS IN MOUTH AND THROAT WITH OR WITHOUT PRESENCE OF ULCERS  *URINARY PROBLEMS  *BOWEL PROBLEMS  UNUSUAL RASH Items with * indicate a potential emergency and should be followed up as soon as possible.  Feel free to call the clinic you have any questions or concerns. The clinic phone number is [phone].  

## 2018-02-06 NOTE — Assessment & Plan Note (Signed)
09/26/2017: Left breast upper outer quadrant within the lumpectomy bed: Fibrosis no malignancy, right axillary lymph node biopsy metastatic high-grade carcinoma ER 0%, PR 0%, Ki-67 90%, HER-2 negative ratio 1.11 (similar to previous ductal carcinoma)  Current treatment:Carboplatin and gemcitabine days 1 and 8 every 3 weeks x6 cycles adjuvant therapy. Today is cycle5day 1  Chemo toxicities: 1.Fatigue 2.Nausea 3.Constipation  4.  Low neutrophil count: We will add Neupogen on the Saturday (day 6).  Mammogram and ultrasound after 4 cycles of treatment: Interval reduction in the size of the right axillary lymph node 1.9 x 0.8 x 1.4 cm previously it was 2 x 1.1 x 1.8 cm  Return to clinic in 3 weeks for cycle 6 after which she will need to undergo surgery.   

## 2018-02-11 ENCOUNTER — Inpatient Hospital Stay: Payer: BLUE CROSS/BLUE SHIELD

## 2018-02-11 VITALS — BP 137/83 | HR 86 | Temp 98.3°F | Resp 18

## 2018-02-11 DIAGNOSIS — C50012 Malignant neoplasm of nipple and areola, left female breast: Secondary | ICD-10-CM | POA: Diagnosis not present

## 2018-02-11 DIAGNOSIS — N6331 Unspecified lump in axillary tail of the right breast: Secondary | ICD-10-CM

## 2018-02-11 MED ORDER — TBO-FILGRASTIM 480 MCG/0.8ML ~~LOC~~ SOSY
480.0000 ug | PREFILLED_SYRINGE | Freq: Once | SUBCUTANEOUS | Status: AC
  Administered 2018-02-11: 480 ug via SUBCUTANEOUS

## 2018-02-11 MED ORDER — TBO-FILGRASTIM 480 MCG/0.8ML ~~LOC~~ SOSY
PREFILLED_SYRINGE | SUBCUTANEOUS | Status: AC
  Filled 2018-02-11: qty 0.8

## 2018-02-13 ENCOUNTER — Inpatient Hospital Stay: Payer: BLUE CROSS/BLUE SHIELD

## 2018-02-13 VITALS — BP 142/92 | HR 85 | Temp 98.2°F | Resp 16

## 2018-02-13 DIAGNOSIS — N6331 Unspecified lump in axillary tail of the right breast: Secondary | ICD-10-CM

## 2018-02-13 DIAGNOSIS — C50912 Malignant neoplasm of unspecified site of left female breast: Secondary | ICD-10-CM

## 2018-02-13 DIAGNOSIS — C50012 Malignant neoplasm of nipple and areola, left female breast: Secondary | ICD-10-CM | POA: Diagnosis not present

## 2018-02-13 LAB — CBC WITH DIFFERENTIAL (CANCER CENTER ONLY)
BASOS PCT: 1 %
Basophils Absolute: 0.1 10*3/uL (ref 0.0–0.1)
EOS ABS: 0.1 10*3/uL (ref 0.0–0.5)
Eosinophils Relative: 0 %
HCT: 26.7 % — ABNORMAL LOW (ref 34.8–46.6)
Hemoglobin: 8.8 g/dL — ABNORMAL LOW (ref 11.6–15.9)
Lymphocytes Relative: 13 %
Lymphs Abs: 2.1 10*3/uL (ref 0.9–3.3)
MCH: 28 pg (ref 25.1–34.0)
MCHC: 33.1 g/dL (ref 31.5–36.0)
MCV: 84.6 fL (ref 79.5–101.0)
MONOS PCT: 5 %
Monocytes Absolute: 0.8 10*3/uL (ref 0.1–0.9)
Neutro Abs: 13.2 10*3/uL — ABNORMAL HIGH (ref 1.5–6.5)
Neutrophils Relative %: 81 %
Platelet Count: 289 10*3/uL (ref 145–400)
RBC: 3.16 MIL/uL — ABNORMAL LOW (ref 3.70–5.45)
RDW: 20.3 % — AB (ref 11.2–14.5)
WBC Count: 16.2 10*3/uL — ABNORMAL HIGH (ref 3.9–10.3)

## 2018-02-13 LAB — CMP (CANCER CENTER ONLY)
ALBUMIN: 4 g/dL (ref 3.5–5.0)
ALT: 42 U/L (ref 0–55)
ANION GAP: 10 (ref 3–11)
AST: 22 U/L (ref 5–34)
Alkaline Phosphatase: 124 U/L (ref 40–150)
BUN: 12 mg/dL (ref 7–26)
CO2: 23 mmol/L (ref 22–29)
Calcium: 9.7 mg/dL (ref 8.4–10.4)
Chloride: 107 mmol/L (ref 98–109)
Creatinine: 0.86 mg/dL (ref 0.60–1.10)
GFR, Est AFR Am: >60 mL/min (ref >60)
GFR, Estimated: >60 mL/min (ref >60)
GLUCOSE: 108 mg/dL (ref 70–140)
POTASSIUM: 3.5 mmol/L (ref 3.5–5.1)
Sodium: 140 mmol/L (ref 136–145)
TOTAL PROTEIN: 7.4 g/dL (ref 6.4–8.3)

## 2018-02-13 MED ORDER — PALONOSETRON HCL INJECTION 0.25 MG/5ML
INTRAVENOUS | Status: AC
  Filled 2018-02-13: qty 5

## 2018-02-13 MED ORDER — SODIUM CHLORIDE 0.9 % IV SOLN
Freq: Once | INTRAVENOUS | Status: AC
  Administered 2018-02-13: 13:00:00 via INTRAVENOUS

## 2018-02-13 MED ORDER — SODIUM CHLORIDE 0.9 % IJ SOLN
10.0000 mL | Freq: Once | INTRAMUSCULAR | Status: AC
  Administered 2018-02-13: 10 mL via INTRAVENOUS
  Filled 2018-02-13: qty 10

## 2018-02-13 MED ORDER — HEPARIN SOD (PORK) LOCK FLUSH 100 UNIT/ML IV SOLN
500.0000 [IU] | Freq: Once | INTRAVENOUS | Status: AC | PRN
  Administered 2018-02-13: 500 [IU]
  Filled 2018-02-13: qty 5

## 2018-02-13 MED ORDER — DEXAMETHASONE SODIUM PHOSPHATE 10 MG/ML IJ SOLN
10.0000 mg | Freq: Once | INTRAMUSCULAR | Status: AC
  Administered 2018-02-13: 10 mg via INTRAVENOUS

## 2018-02-13 MED ORDER — GEMCITABINE HCL CHEMO INJECTION 1 GM/26.3ML
800.0000 mg/m2 | Freq: Once | INTRAVENOUS | Status: AC
  Administered 2018-02-13: 1672 mg via INTRAVENOUS
  Filled 2018-02-13: qty 43.97

## 2018-02-13 MED ORDER — DEXAMETHASONE SODIUM PHOSPHATE 10 MG/ML IJ SOLN
INTRAMUSCULAR | Status: AC
  Filled 2018-02-13: qty 1

## 2018-02-13 MED ORDER — PALONOSETRON HCL INJECTION 0.25 MG/5ML
0.2500 mg | Freq: Once | INTRAVENOUS | Status: AC
  Administered 2018-02-13: 0.25 mg via INTRAVENOUS

## 2018-02-13 MED ORDER — SODIUM CHLORIDE 0.9 % IV SOLN
298.0000 mg | Freq: Once | INTRAVENOUS | Status: AC
  Administered 2018-02-13: 300 mg via INTRAVENOUS
  Filled 2018-02-13: qty 30

## 2018-02-13 MED ORDER — SODIUM CHLORIDE 0.9% FLUSH
10.0000 mL | INTRAVENOUS | Status: DC | PRN
  Administered 2018-02-13: 10 mL
  Filled 2018-02-13: qty 10

## 2018-02-13 NOTE — Patient Instructions (Signed)

## 2018-02-13 NOTE — Patient Instructions (Signed)
[facility] Discharge Instructions for Patients Receiving Chemotherapy  Today you received the following chemotherapy agents Gemzar and Carboplatin   To help prevent nausea and vomiting after your treatment, we encourage you to take your nausea medication as directed.    If you develop nausea and vomiting that is not controlled by your nausea medication, call the clinic.   BELOW ARE SYMPTOMS THAT SHOULD BE REPORTED IMMEDIATELY:  *FEVER GREATER THAN 100.5 F  *CHILLS WITH OR WITHOUT FEVER  NAUSEA AND VOMITING THAT IS NOT CONTROLLED WITH YOUR NAUSEA MEDICATION  *UNUSUAL SHORTNESS OF BREATH  *UNUSUAL BRUISING OR BLEEDING  TENDERNESS IN MOUTH AND THROAT WITH OR WITHOUT PRESENCE OF ULCERS  *URINARY PROBLEMS  *BOWEL PROBLEMS  UNUSUAL RASH Items with * indicate a potential emergency and should be followed up as soon as possible.  Feel free to call the clinic should you have any questions or concerns. The clinic phone number is [phone].  Please show the CHEMO ALERT CARD at check-in to the Emergency Department and triage nurse.   

## 2018-02-15 ENCOUNTER — Inpatient Hospital Stay: Payer: BLUE CROSS/BLUE SHIELD

## 2018-02-15 VITALS — BP 155/82 | HR 75 | Temp 98.2°F | Resp 18

## 2018-02-15 DIAGNOSIS — C50012 Malignant neoplasm of nipple and areola, left female breast: Secondary | ICD-10-CM | POA: Diagnosis not present

## 2018-02-15 DIAGNOSIS — N6331 Unspecified lump in axillary tail of the right breast: Secondary | ICD-10-CM

## 2018-02-15 MED ORDER — PEGFILGRASTIM-CBQV 6 MG/0.6ML ~~LOC~~ SOSY
6.0000 mg | PREFILLED_SYRINGE | Freq: Once | SUBCUTANEOUS | Status: AC
  Administered 2018-02-15: 6 mg via SUBCUTANEOUS

## 2018-02-15 NOTE — Patient Instructions (Signed)
Pegfilgrastim injection What is this medicine? PEGFILGRASTIM (PEG fil gra stim) is a long-acting granulocyte colony-stimulating factor that stimulates the growth of neutrophils, a type of white blood cell important in the body's fight against infection. It is used to reduce the incidence of fever and infection in patients with certain types of cancer who are receiving chemotherapy that affects the bone marrow, and to increase survival after being exposed to high doses of radiation. This medicine may be used for other purposes; ask your health care provider or pharmacist if you have questions. COMMON BRAND NAME(S): Neulasta What should I tell my health care provider before I take this medicine? They need to know if you have any of these conditions: -kidney disease -latex allergy -ongoing radiation therapy -sickle cell disease -skin reactions to acrylic adhesives (On-Body Injector only) -an unusual or allergic reaction to pegfilgrastim, filgrastim, other medicines, foods, dyes, or preservatives -pregnant or trying to get pregnant -breast-feeding How should I use this medicine? This medicine is for injection under the skin. If you get this medicine at home, you will be taught how to prepare and give the pre-filled syringe or how to use the On-body Injector. Refer to the patient Instructions for Use for detailed instructions. Use exactly as directed. Tell your healthcare provider immediately if you suspect that the On-body Injector may not have performed as intended or if you suspect the use of the On-body Injector resulted in a missed or partial dose. It is important that you put your used needles and syringes in a special sharps container. Do not put them in a trash can. If you do not have a sharps container, call your pharmacist or healthcare provider to get one. Talk to your pediatrician regarding the use of this medicine in children. While this drug may be prescribed for selected conditions,  precautions do apply. Overdosage: If you think you have taken too much of this medicine contact a poison control center or emergency room at once. NOTE: This medicine is only for you. Do not share this medicine with others. What if I miss a dose? It is important not to miss your dose. Call your doctor or health care professional if you miss your dose. If you miss a dose due to an On-body Injector failure or leakage, a new dose should be administered as soon as possible using a single prefilled syringe for manual use. What may interact with this medicine? Interactions have not been studied. Give your health care provider a list of all the medicines, herbs, non-prescription drugs, or dietary supplements you use. Also tell them if you smoke, drink alcohol, or use illegal drugs. Some items may interact with your medicine. This list may not describe all possible interactions. Give your health care provider a list of all the medicines, herbs, non-prescription drugs, or dietary supplements you use. Also tell them if you smoke, drink alcohol, or use illegal drugs. Some items may interact with your medicine. What should I watch for while using this medicine? You may need blood work done while you are taking this medicine. If you are going to need a MRI, CT scan, or other procedure, tell your doctor that you are using this medicine (On-Body Injector only). What side effects may I notice from receiving this medicine? Side effects that you should report to your doctor or health care professional as soon as possible: -allergic reactions like skin rash, itching or hives, swelling of the face, lips, or tongue -dizziness -fever -pain, redness, or irritation at site   where injected -pinpoint red spots on the skin -red or dark-brown urine -shortness of breath or breathing problems -stomach or side pain, or pain at the shoulder -swelling -tiredness -trouble passing urine or change in the amount of urine Side  effects that usually do not require medical attention (report to your doctor or health care professional if they continue or are bothersome): -bone pain -muscle pain This list may not describe all possible side effects. Call your doctor for medical advice about side effects. You may report side effects to FDA at 1-800-FDA-1088. Where should I keep my medicine? Keep out of the reach of children. Store pre-filled syringes in a refrigerator between 2 and 8 degrees C (36 and 46 degrees F). Do not freeze. Keep in carton to protect from light. Throw away this medicine if it is left out of the refrigerator for more than 48 hours. Throw away any unused medicine after the expiration date. NOTE: This sheet is a summary. It may not cover all possible information. If you have questions about this medicine, talk to your doctor, pharmacist, or health care provider.  2018 Elsevier/Gold Standard (2016-10-07 12:58:03)  

## 2018-02-23 MED ORDER — DIPHENHYDRAMINE HCL 50 MG/ML IJ SOLN
INTRAMUSCULAR | Status: AC
  Filled 2018-02-23: qty 1

## 2018-02-23 MED ORDER — FAMOTIDINE IN NACL 20-0.9 MG/50ML-% IV SOLN
INTRAVENOUS | Status: AC
  Filled 2018-02-23: qty 50

## 2018-02-27 ENCOUNTER — Telehealth: Payer: Self-pay

## 2018-02-27 ENCOUNTER — Inpatient Hospital Stay (HOSPITAL_BASED_OUTPATIENT_CLINIC_OR_DEPARTMENT_OTHER): Payer: BLUE CROSS/BLUE SHIELD | Admitting: Hematology and Oncology

## 2018-02-27 ENCOUNTER — Inpatient Hospital Stay: Payer: BLUE CROSS/BLUE SHIELD

## 2018-02-27 ENCOUNTER — Telehealth: Payer: Self-pay | Admitting: Hematology and Oncology

## 2018-02-27 ENCOUNTER — Inpatient Hospital Stay: Payer: BLUE CROSS/BLUE SHIELD | Attending: Hematology and Oncology

## 2018-02-27 VITALS — BP 153/89 | HR 87 | Temp 97.9°F | Resp 18 | Ht 60.56 in | Wt 224.3 lb

## 2018-02-27 DIAGNOSIS — R110 Nausea: Secondary | ICD-10-CM

## 2018-02-27 DIAGNOSIS — Z79899 Other long term (current) drug therapy: Secondary | ICD-10-CM | POA: Diagnosis not present

## 2018-02-27 DIAGNOSIS — Z923 Personal history of irradiation: Secondary | ICD-10-CM | POA: Insufficient documentation

## 2018-02-27 DIAGNOSIS — Z5111 Encounter for antineoplastic chemotherapy: Secondary | ICD-10-CM

## 2018-02-27 DIAGNOSIS — Z171 Estrogen receptor negative status [ER-]: Secondary | ICD-10-CM | POA: Diagnosis not present

## 2018-02-27 DIAGNOSIS — N6331 Unspecified lump in axillary tail of the right breast: Secondary | ICD-10-CM

## 2018-02-27 DIAGNOSIS — C50012 Malignant neoplasm of nipple and areola, left female breast: Secondary | ICD-10-CM | POA: Insufficient documentation

## 2018-02-27 DIAGNOSIS — K5900 Constipation, unspecified: Secondary | ICD-10-CM

## 2018-02-27 DIAGNOSIS — Z95828 Presence of other vascular implants and grafts: Secondary | ICD-10-CM

## 2018-02-27 DIAGNOSIS — R5383 Other fatigue: Secondary | ICD-10-CM | POA: Diagnosis not present

## 2018-02-27 LAB — CMP (CANCER CENTER ONLY)
ALBUMIN: 4.3 g/dL (ref 3.5–5.0)
ALK PHOS: 137 U/L (ref 40–150)
ALT: 41 U/L (ref 0–55)
ANION GAP: 6 (ref 3–11)
AST: 24 U/L (ref 5–34)
BILIRUBIN TOTAL: 0.3 mg/dL (ref 0.2–1.2)
BUN: 14 mg/dL (ref 7–26)
CALCIUM: 9.8 mg/dL (ref 8.4–10.4)
CO2: 27 mmol/L (ref 22–29)
Chloride: 106 mmol/L (ref 98–109)
Creatinine: 1.03 mg/dL (ref 0.60–1.10)
GFR, Est AFR Am: >60 mL/min (ref >60)
GFR, Estimated: >60 mL/min (ref >60)
GLUCOSE: 105 mg/dL (ref 70–140)
Potassium: 3.4 mmol/L — ABNORMAL LOW (ref 3.5–5.1)
Sodium: 139 mmol/L (ref 136–145)
TOTAL PROTEIN: 7.6 g/dL (ref 6.4–8.3)

## 2018-02-27 LAB — CBC WITH DIFFERENTIAL (CANCER CENTER ONLY)
BASOS ABS: 0 10*3/uL (ref 0.0–0.1)
BASOS PCT: 0 %
EOS PCT: 1 %
Eosinophils Absolute: 0.1 10*3/uL (ref 0.0–0.5)
HCT: 29.1 % — ABNORMAL LOW (ref 34.8–46.6)
Hemoglobin: 9.1 g/dL — ABNORMAL LOW (ref 11.6–15.9)
Lymphocytes Relative: 14 %
Lymphs Abs: 1.4 10*3/uL (ref 0.9–3.3)
MCH: 28 pg (ref 25.1–34.0)
MCHC: 31.3 g/dL — ABNORMAL LOW (ref 31.5–36.0)
MCV: 89.5 fL (ref 79.5–101.0)
MONO ABS: 1.1 10*3/uL — AB (ref 0.1–0.9)
Monocytes Relative: 11 %
NRBC: 1 /100{WBCs} — AB
Neutro Abs: 7.5 10*3/uL — ABNORMAL HIGH (ref 1.5–6.5)
Neutrophils Relative %: 74 %
PLATELETS: 156 10*3/uL (ref 145–400)
RBC: 3.25 MIL/uL — ABNORMAL LOW (ref 3.70–5.45)
RDW: 20.2 % — ABNORMAL HIGH (ref 11.2–14.5)
WBC Count: 10 10*3/uL (ref 3.9–10.3)

## 2018-02-27 MED ORDER — SODIUM CHLORIDE 0.9 % IV SOLN
Freq: Once | INTRAVENOUS | Status: AC
  Administered 2018-02-27: 14:00:00 via INTRAVENOUS

## 2018-02-27 MED ORDER — DEXAMETHASONE SODIUM PHOSPHATE 10 MG/ML IJ SOLN
INTRAMUSCULAR | Status: AC
Start: 2018-02-27 — End: 2018-02-27
  Filled 2018-02-27: qty 1

## 2018-02-27 MED ORDER — PALONOSETRON HCL INJECTION 0.25 MG/5ML
INTRAVENOUS | Status: AC
  Filled 2018-02-27: qty 5

## 2018-02-27 MED ORDER — GEMCITABINE HCL CHEMO INJECTION 1 GM/26.3ML
800.0000 mg/m2 | Freq: Once | INTRAVENOUS | Status: AC
  Administered 2018-02-27: 1672 mg via INTRAVENOUS
  Filled 2018-02-27: qty 43.97

## 2018-02-27 MED ORDER — DEXAMETHASONE SODIUM PHOSPHATE 10 MG/ML IJ SOLN
10.0000 mg | Freq: Once | INTRAMUSCULAR | Status: AC
  Administered 2018-02-27: 10 mg via INTRAVENOUS

## 2018-02-27 MED ORDER — SODIUM CHLORIDE 0.9% FLUSH
10.0000 mL | INTRAVENOUS | Status: DC | PRN
  Administered 2018-02-27: 10 mL
  Filled 2018-02-27: qty 10

## 2018-02-27 MED ORDER — PALONOSETRON HCL INJECTION 0.25 MG/5ML
0.2500 mg | Freq: Once | INTRAVENOUS | Status: AC
  Administered 2018-02-27: 0.25 mg via INTRAVENOUS

## 2018-02-27 MED ORDER — HEPARIN SOD (PORK) LOCK FLUSH 100 UNIT/ML IV SOLN
500.0000 [IU] | Freq: Once | INTRAVENOUS | Status: AC | PRN
  Administered 2018-02-27: 500 [IU]
  Filled 2018-02-27: qty 5

## 2018-02-27 MED ORDER — SODIUM CHLORIDE 0.9% FLUSH
10.0000 mL | Freq: Once | INTRAVENOUS | Status: AC
  Administered 2018-02-27: 10 mL
  Filled 2018-02-27: qty 10

## 2018-02-27 MED ORDER — CARBOPLATIN CHEMO INJECTION 450 MG/45ML
257.0000 mg | Freq: Once | INTRAVENOUS | Status: AC
  Administered 2018-02-27: 260 mg via INTRAVENOUS
  Filled 2018-02-27: qty 26

## 2018-02-27 NOTE — Telephone Encounter (Signed)
Printed avs and calender of upcoming appointment. Per 5/6 los 

## 2018-02-27 NOTE — Telephone Encounter (Signed)
Gave patient AVs and calendar of upcoming may appointments.  °

## 2018-02-27 NOTE — Assessment & Plan Note (Addendum)
09/26/2017: Left breast upper outer quadrant within the lumpectomy bed: Fibrosis no malignancy, right axillary lymph node biopsy metastatic high-grade carcinoma ER 0%, PR 0%, Ki-67 90%, HER-2 negative ratio 1.11 (similar to previous ductal carcinoma)  Current treatment:Carboplatin and gemcitabine days 1 and 8 every 3 weeks x6 cycles adjuvant therapy. Today is cycle6day 1  Chemo toxicities: 1.Fatigue 2.Nausea 3.Constipation  4.  Low neutrophil count: We will add Neupogen on the Saturday (day 6).  Mammogram and ultrasound after 4 cycles of treatment: Interval reduction in the size of the right axillary lymph node 1.9 x 0.8 x 1.4 cm previously it was 2 x 1.1 x 1.8 cm  Plan: 1. Breast MRI on 5/17/20191.  2. Tumor board presentation 3.  Follow-up with Dr. Donne Hazel to discuss surgery We will need to keep the port in place to see if she needs any additional systemic therapy afterwards.  Return to clinic after breast MRI to discuss results Patient will be going to [place] for a week starting 03/20/2018

## 2018-02-27 NOTE — Progress Notes (Signed)
Patient Care Team: Patient, [name] as PCP - General ([place]) Neldon Mc, MD as Surgeon (General Surgery) Everardo All, MD (Hematology and Oncology)  DIAGNOSIS:  Encounter Diagnosis  Name Primary?  . 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, unspecified estrogen receptor status ([place]) Yes    SUMMARY OF ONCOLOGIC HISTORY:   Breast cancer, IDC, Left, Stage III, Triple negative   06/30/2011 Initial Diagnosis    Breast cancer, IDC, Left, Stage III, Triple negative, 7.6 cm breast mass and palpable axillary mass      09/06/2011 Miscellaneous    BRCA 1 and 2: Negative      09/14/2011 - 11/23/2011 Neo-Adjuvant Chemotherapy    Dose dense FEC followed by dose dense Taxotere      12/21/2011 Surgery    Left lumpectomy: High-grade poorly differentiated IDC 1.7 cm with high-grade DCIS, margins negative, 3/7 lymph nodes positive, ER 0%, PR 0%, HER-2 negative ratio 1.33 T1CN1 stage IIb      12/31/2011 - 02/15/2012 Radiation Therapy    Radiation at [facility]      09/26/2017 Relapse/Recurrence    Left breast upper outer quadrant within the lumpectomy bed: Fibrosis no malignancy, right axillary lymph node biopsy metastatic high-grade carcinoma ER 0%, PR 0%, Ki-67 90%, HER-2 negative ratio 1.11 (similar to previous ductal carcinoma)      11/14/2017 -  Neo-Adjuvant Chemotherapy    Neo-Adjuvant chemotherapy with Gemzar and carboplatin days 1 and 8 every 3 weeks      01/10/2018 Genetic Testing    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  The Hereditary Gene Panel offered by [facility] includes sequencing and/or deletion duplication testing of the following 47 genes: APC, ATM, AXIN2, BARD1, BMPR1A, BRCA1, BRCA2, BRIP1, CDH1, CDK4, CDKN2A (p14ARF), CDKN2A (p16INK4a), CHEK2, CTNNA1, DICER1, EPCAM (Deletion/duplication testing only), GREM1 (promoter region deletion/duplication testing only), KIT, MEN1, MLH1, MSH2, MSH3, MSH6, MUTYH, NBN,  NF1, NHTL1, PALB2, PDGFRA, PMS2, POLD1, POLE, PTEN, RAD50, RAD51C, RAD51D, SDHB, SDHC, SDHD, SMAD4, SMARCA4. STK11, TP53, TSC1, TSC2, and VHL.  The following genes were evaluated for sequence changes only: SDHA and HOXB13 c.251G>A variant only. The report date is January 10, 2018.        CHIEF COMPLIANT: Cycle 6-day 1 carboplatin and gemcitabine  INTERVAL HISTORY: Jessica Simpson is a 47 year old with above-mentioned history of right breast cancer who is undergoing neoadjuvant chemotherapy with carboplatin and gemcitabine.  Today is cycle 6-day 1.  She has no major side effects to chemotherapy.  She is still anxious that the lump has not shrunk in size.  REVIEW OF SYSTEMS:   Constitutional: Denies fevers, chills or abnormal weight loss Eyes: Denies blurriness of vision Ears, nose, mouth, throat, and face: Denies mucositis or sore throat Respiratory: Denies cough, dyspnea or wheezes Cardiovascular: Denies palpitation, chest discomfort Gastrointestinal:  Denies nausea, heartburn or change in bowel habits Skin: Denies abnormal skin rashes Lymphatics: Denies new lymphadenopathy or easy bruising Neurological:Denies numbness, tingling or new weaknesses Behavioral/Psych: Mood is stable, no new changes  Extremities: No lower extremity edema  All other systems were reviewed with the patient and are negative.  I have reviewed the past medical history, past surgical history, social history and family history with the patient and they are unchanged from previous note.  ALLERGIES:  is allergic to codeine and lortab [hydrocodone-acetaminophen].  MEDICATIONS:  Current Outpatient Medications  Medication Sig Dispense Refill  . acetaminophen (TYLENOL) 500 MG tablet Take 500-1,000 mg by mouth every 6 (six)  hours as needed. For pain    . amLODipine (NORVASC) 5 MG tablet Take 5 mg by mouth daily.  6  . Cholecalciferol 5000 units capsule Take 1 capsule (5,000 Units total) by mouth daily.    Marland Kitchen  lidocaine-prilocaine (EMLA) cream Apply to affected area once 30 g 3  . loratadine (CLARITIN) 10 MG tablet Take 10 mg by mouth daily as needed (for seasonal allergies (Spring)).    Marland Kitchen LORazepam (ATIVAN) 0.5 MG tablet Take 1 tablet (0.5 mg total) by mouth at bedtime as needed for sleep. 30 tablet 0  . ondansetron (ZOFRAN) 8 MG tablet Take 1 tablet (8 mg total) by mouth 2 (two) times daily as needed for refractory nausea / vomiting. Start on day 3 after chemotherapy. 30 tablet 1  . prochlorperazine (COMPAZINE) 10 MG tablet Take 1 tablet (10 mg total) by mouth every 6 (six) hours as needed (Nausea or vomiting). 30 tablet 1  . traMADol (ULTRAM) 50 MG tablet Take 2 tablets (100 mg total) by mouth every 6 (six) hours as needed. 10 tablet 0   No current facility-administered medications for this visit.    Facility-Administered Medications Ordered in Other Visits  Medication Dose Route Frequency Provider Last Rate Last Dose  . lactated ringers infusion    Continuous PRN Kerby Less, CRNA        PHYSICAL EXAMINATION: ECOG PERFORMANCE STATUS: 1 - Symptomatic but completely ambulatory  Vitals:   02/27/18 1152  BP: (!) 153/89  Pulse: 87  Resp: 18  Temp: 97.9 F (36.6 C)  SpO2: 100%   Filed Weights   02/27/18 1152  Weight: 224 lb 4.8 oz (101.7 kg)    GENERAL:alert, no distress and comfortable SKIN: skin color, texture, turgor are normal, no rashes or significant lesions EYES: normal, Conjunctiva are pink and non-injected, sclera clear OROPHARYNX:no exudate, no erythema and lips, buccal mucosa, and tongue normal  NECK: supple, thyroid normal size, non-tender, without nodularity LYMPH:  no palpable lymphadenopathy in the cervical, axillary or inguinal LUNGS: clear to auscultation and percussion with normal breathing effort HEART: regular rate & rhythm and no murmurs and no lower extremity edema ABDOMEN:abdomen soft, non-tender and normal bowel sounds MUSCULOSKELETAL:no cyanosis of  digits and no clubbing  NEURO: alert & oriented x 3 with fluent speech, no focal motor/sensory deficits EXTREMITIES: No lower extremity edema  LABORATORY DATA:  I have reviewed the data as listed CMP Latest Ref Rng & Units 02/27/2018 02/13/2018 02/06/2018  Glucose 70 - 140 mg/dL 105 108 99  BUN 7 - 26 mg/dL 14 12 14   Creatinine 0.60 - 1.10 mg/dL 1.03 0.86 1.05  Sodium 136 - 145 mmol/L 139 140 141  Potassium 3.5 - 5.1 mmol/L 3.4(L) 3.5 3.7  Chloride 98 - 109 mmol/L 106 107 106  CO2 22 - 29 mmol/L 27 23 26   Calcium 8.4 - 10.4 mg/dL 9.8 9.7 9.8  Total Protein 6.4 - 8.3 g/dL 7.6 7.4 7.5  Total Bilirubin 0.2 - 1.2 mg/dL 0.3 <0.2(L) 0.2  Alkaline Phos 40 - 150 U/L 137 124 139  AST 5 - 34 U/L 24 22 26   ALT 0 - 55 U/L 41 42 57(H)    Lab Results  Component Value Date   WBC 10.0 02/27/2018   HGB 9.1 (L) 02/27/2018   HCT 29.1 (L) 02/27/2018   MCV 89.5 02/27/2018   PLT 156 02/27/2018   NEUTROABS 7.5 (H) 02/27/2018    ASSESSMENT & PLAN:  Breast cancer, IDC, Left, Stage III, Triple negative 09/26/2017:  Left breast upper outer quadrant within the lumpectomy bed: Fibrosis no malignancy, right axillary lymph node biopsy metastatic high-grade carcinoma ER 0%, PR 0%, Ki-67 90%, HER-2 negative ratio 1.11 (similar to previous ductal carcinoma)  Current treatment:Carboplatin and gemcitabine days 1 and 8 every 3 weeks x6 cycles adjuvant therapy. Today is cycle6day 1  Chemo toxicities: 1.Fatigue 2.Nausea 3.Constipation  4.  Low neutrophil count: We will add Neupogen on the Saturday (day 6).  Mammogram and ultrasound after 4 cycles of treatment: Interval reduction in the size of the right axillary lymph node 1.9 x 0.8 x 1.4 cm previously it was 2 x 1.1 x 1.8 cm  Plan: 1. Breast MRI on 5/17/20191.  2. Tumor board presentation 3.  Follow-up with Dr. Donne Hazel to discuss surgery We will need to keep the port in place to see if she needs any additional systemic therapy  afterwards.  Return to clinic after breast MRI to discuss results Patient will be going to [place] for a week starting 03/20/2018 Cycle 6-day 1 carboplatin and gemcitabine   Orders Placed This Encounter  Procedures  . MR BREAST BILATERAL W WO CONTRAST INC CAD    Standing Status:   Future    Standing Expiration Date:   04/30/2019    Order Specific Question:   If indicated for the ordered procedure, I authorize the administration of contrast media per Radiology protocol    Answer:   Yes    Order Specific Question:   What is the patient's sedation requirement?    Answer:   No Sedation    Order Specific Question:   Does the patient have a pacemaker or implanted devices?    Answer:   No    Order Specific Question:   Radiology Contrast Protocol - do NOT remove file path    Answer:   [email or web address]    Order Specific Question:   Preferred imaging location?    Answer:   [facility] (table limit-550lbs)   The patient has a good understanding of the overall plan. she agrees with it. she will call with any problems that may develop before the next visit here.   Harriette Ohara, MD 02/27/18

## 2018-02-27 NOTE — Patient Instructions (Signed)
[facility] Discharge Instructions for Patients Receiving Chemotherapy  Today you received the following chemotherapy agents:  Carboplatin & Gemzar  To help prevent nausea and vomiting after your treatment, we encourage you to take your nausea medication as prescribed.    If you develop nausea and vomiting that is not controlled by your nausea medication, call the clinic.   BELOW ARE SYMPTOMS THAT SHOULD BE REPORTED IMMEDIATELY:  *FEVER GREATER THAN 100.5 F  *CHILLS WITH OR WITHOUT FEVER  NAUSEA AND VOMITING THAT IS NOT CONTROLLED WITH YOUR NAUSEA MEDICATION  *UNUSUAL SHORTNESS OF BREATH  *UNUSUAL BRUISING OR BLEEDING  TENDERNESS IN MOUTH AND THROAT WITH OR WITHOUT PRESENCE OF ULCERS  *URINARY PROBLEMS  *BOWEL PROBLEMS  UNUSUAL RASH Items with * indicate a potential emergency and should be followed up as soon as possible.  Feel free to call the clinic should you have any questions or concerns. The clinic phone number is [phone].  Please show the CHEMO ALERT CARD at check-in to the Emergency Department and triage nurse.   

## 2018-03-04 ENCOUNTER — Inpatient Hospital Stay (HOSPITAL_BASED_OUTPATIENT_CLINIC_OR_DEPARTMENT_OTHER): Payer: BLUE CROSS/BLUE SHIELD

## 2018-03-04 VITALS — BP 148/74 | HR 99 | Temp 98.3°F | Resp 18

## 2018-03-04 DIAGNOSIS — C50012 Malignant neoplasm of nipple and areola, left female breast: Secondary | ICD-10-CM | POA: Diagnosis not present

## 2018-03-04 DIAGNOSIS — Z171 Estrogen receptor negative status [ER-]: Secondary | ICD-10-CM | POA: Diagnosis not present

## 2018-03-04 DIAGNOSIS — N6331 Unspecified lump in axillary tail of the right breast: Secondary | ICD-10-CM

## 2018-03-04 DIAGNOSIS — K5900 Constipation, unspecified: Secondary | ICD-10-CM

## 2018-03-04 DIAGNOSIS — R110 Nausea: Secondary | ICD-10-CM

## 2018-03-04 DIAGNOSIS — Z79899 Other long term (current) drug therapy: Secondary | ICD-10-CM | POA: Diagnosis not present

## 2018-03-04 DIAGNOSIS — Z5111 Encounter for antineoplastic chemotherapy: Secondary | ICD-10-CM | POA: Diagnosis not present

## 2018-03-04 DIAGNOSIS — R5383 Other fatigue: Secondary | ICD-10-CM | POA: Diagnosis not present

## 2018-03-04 DIAGNOSIS — Z923 Personal history of irradiation: Secondary | ICD-10-CM

## 2018-03-04 MED ORDER — TBO-FILGRASTIM 480 MCG/0.8ML ~~LOC~~ SOSY
PREFILLED_SYRINGE | SUBCUTANEOUS | Status: AC
  Filled 2018-03-04: qty 0.8

## 2018-03-04 MED ORDER — TBO-FILGRASTIM 480 MCG/0.8ML ~~LOC~~ SOSY
480.0000 ug | PREFILLED_SYRINGE | Freq: Once | SUBCUTANEOUS | Status: AC
  Administered 2018-03-04: 480 ug via SUBCUTANEOUS

## 2018-03-04 NOTE — Patient Instructions (Signed)
Tbo-Filgrastim injection What is this medicine? TBO-FILGRASTIM (T B O fil GRA stim) is a granulocyte colony-stimulating factor that stimulates the growth of neutrophils, a type of white blood cell important in the body's fight against infection. It is used to reduce the incidence of fever and infection in patients with certain types of cancer who are receiving chemotherapy that affects the bone marrow. This medicine may be used for other purposes; ask your health care provider or pharmacist if you have questions. COMMON BRAND NAME(S): Granix What should I tell my health care provider before I take this medicine? They need to know if you have any of these conditions: -bone scan or tests planned -kidney disease -sickle cell anemia -an unusual or allergic reaction to tbo-filgrastim, filgrastim, pegfilgrastim, other medicines, foods, dyes, or preservatives -pregnant or trying to get pregnant -breast-feeding How should I use this medicine? This medicine is for injection under the skin. If you get this medicine at home, you will be taught how to prepare and give this medicine. Refer to the Instructions for Use that come with your medication packaging. Use exactly as directed. Take your medicine at regular intervals. Do not take your medicine more often than directed. It is important that you put your used needles and syringes in a special sharps container. Do not put them in a trash can. If you do not have a sharps container, call your pharmacist or healthcare provider to get one. Talk to your pediatrician regarding the use of this medicine in children. Special care may be needed. Overdosage: If you think you have taken too much of this medicine contact a poison control center or emergency room at once. NOTE: This medicine is only for you. Do not share this medicine with others. What if I miss a dose? It is important not to miss your dose. Call your doctor or health care professional if you miss a  dose. What may interact with this medicine? This medicine may interact with the following medications: -medicines that may cause a release of neutrophils, such as lithium This list may not describe all possible interactions. Give your health care provider a list of all the medicines, herbs, non-prescription drugs, or dietary supplements you use. Also tell them if you smoke, drink alcohol, or use illegal drugs. Some items may interact with your medicine. What should I watch for while using this medicine? You may need blood work done while you are taking this medicine. What side effects may I notice from receiving this medicine? Side effects that you should report to your doctor or health care professional as soon as possible: -allergic reactions like skin rash, itching or hives, swelling of the face, lips, or tongue -blood in the urine -dark urine -dizziness -fast heartbeat -feeling faint -shortness of breath or breathing problems -signs and symptoms of infection like fever or chills; cough; or sore throat -signs and symptoms of kidney injury like trouble passing urine or change in the amount of urine -stomach or side pain, or pain at the shoulder -sweating -swelling of the legs, ankles, or abdomen -tiredness Side effects that usually do not require medical attention (report to your doctor or health care professional if they continue or are bothersome): -bone pain -headache -muscle pain -vomiting This list may not describe all possible side effects. Call your doctor for medical advice about side effects. You may report side effects to FDA at 1-800-FDA-1088. Where should I keep my medicine? Keep out of the reach of children. Store in a refrigerator between   2 and 8 degrees C (36 and 46 degrees F). Keep in carton to protect from light. Throw away this medicine if it is left out of the refrigerator for more than 5 consecutive days. Throw away any unused medicine after the expiration  date. NOTE: This sheet is a summary. It may not cover all possible information. If you have questions about this medicine, talk to your doctor, pharmacist, or health care provider.  2018 Elsevier/Gold Standard (2015-12-01 19:07:04)  

## 2018-03-06 ENCOUNTER — Inpatient Hospital Stay: Payer: BLUE CROSS/BLUE SHIELD

## 2018-03-06 VITALS — BP 142/83 | HR 79 | Temp 98.3°F | Resp 18

## 2018-03-06 DIAGNOSIS — N6331 Unspecified lump in axillary tail of the right breast: Secondary | ICD-10-CM

## 2018-03-06 DIAGNOSIS — C50012 Malignant neoplasm of nipple and areola, left female breast: Secondary | ICD-10-CM | POA: Diagnosis not present

## 2018-03-06 LAB — CMP (CANCER CENTER ONLY)
ALT: 41 U/L (ref 0–55)
AST: 23 U/L (ref 5–34)
Albumin: 4.3 g/dL (ref 3.5–5.0)
Alkaline Phosphatase: 119 U/L (ref 40–150)
Anion gap: 9 (ref 3–11)
BILIRUBIN TOTAL: 0.3 mg/dL (ref 0.2–1.2)
BUN: 14 mg/dL (ref 7–26)
CO2: 26 mmol/L (ref 22–29)
CREATININE: 0.85 mg/dL (ref 0.60–1.10)
Calcium: 9.9 mg/dL (ref 8.4–10.4)
Chloride: 106 mmol/L (ref 98–109)
GFR, Est AFR Am: >60 mL/min (ref >60)
Glucose, Bld: 88 mg/dL (ref 70–140)
POTASSIUM: 3.7 mmol/L (ref 3.5–5.1)
Sodium: 141 mmol/L (ref 136–145)
TOTAL PROTEIN: 7.6 g/dL (ref 6.4–8.3)

## 2018-03-06 LAB — CBC WITH DIFFERENTIAL (CANCER CENTER ONLY)
BASOS PCT: 0 %
Basophils Absolute: 0 10*3/uL (ref 0.0–0.1)
EOS PCT: 0 %
Eosinophils Absolute: 0.1 10*3/uL (ref 0.0–0.5)
HCT: 26.3 % — ABNORMAL LOW (ref 34.8–46.6)
Hemoglobin: 8.5 g/dL — ABNORMAL LOW (ref 11.6–15.9)
Lymphocytes Relative: 17 %
Lymphs Abs: 2.1 10*3/uL (ref 0.9–3.3)
MCH: 28.3 pg (ref 25.1–34.0)
MCHC: 32.3 g/dL (ref 31.5–36.0)
MCV: 87.7 fL (ref 79.5–101.0)
MONO ABS: 0.7 10*3/uL (ref 0.1–0.9)
Monocytes Relative: 5 %
Neutro Abs: 9.7 10*3/uL — ABNORMAL HIGH (ref 1.5–6.5)
Neutrophils Relative %: 78 %
PLATELETS: 227 10*3/uL (ref 145–400)
RBC: 3 MIL/uL — AB (ref 3.70–5.45)
RDW: 17.9 % — AB (ref 11.2–14.5)
WBC: 12.5 10*3/uL — AB (ref 3.9–10.3)

## 2018-03-06 MED ORDER — DEXAMETHASONE SODIUM PHOSPHATE 10 MG/ML IJ SOLN
10.0000 mg | Freq: Once | INTRAMUSCULAR | Status: AC
  Administered 2018-03-06: 10 mg via INTRAVENOUS

## 2018-03-06 MED ORDER — PALONOSETRON HCL INJECTION 0.25 MG/5ML
INTRAVENOUS | Status: AC
  Filled 2018-03-06: qty 5

## 2018-03-06 MED ORDER — SODIUM CHLORIDE 0.9% FLUSH
10.0000 mL | INTRAVENOUS | Status: DC | PRN
  Administered 2018-03-06: 10 mL
  Filled 2018-03-06: qty 10

## 2018-03-06 MED ORDER — SODIUM CHLORIDE 0.9 % IV SOLN
300.0000 mg | Freq: Once | INTRAVENOUS | Status: AC
  Administered 2018-03-06: 300 mg via INTRAVENOUS
  Filled 2018-03-06: qty 30

## 2018-03-06 MED ORDER — SODIUM CHLORIDE 0.9 % IV SOLN
800.0000 mg/m2 | Freq: Once | INTRAVENOUS | Status: AC
  Administered 2018-03-06: 1672 mg via INTRAVENOUS
  Filled 2018-03-06: qty 43.97

## 2018-03-06 MED ORDER — SODIUM CHLORIDE 0.9 % IV SOLN
Freq: Once | INTRAVENOUS | Status: AC
  Administered 2018-03-06: 12:00:00 via INTRAVENOUS

## 2018-03-06 MED ORDER — HEPARIN SOD (PORK) LOCK FLUSH 100 UNIT/ML IV SOLN
500.0000 [IU] | Freq: Once | INTRAVENOUS | Status: AC | PRN
  Administered 2018-03-06: 500 [IU]
  Filled 2018-03-06: qty 5

## 2018-03-06 MED ORDER — DEXAMETHASONE SODIUM PHOSPHATE 10 MG/ML IJ SOLN
INTRAMUSCULAR | Status: AC
  Filled 2018-03-06: qty 1

## 2018-03-06 MED ORDER — PALONOSETRON HCL INJECTION 0.25 MG/5ML
0.2500 mg | Freq: Once | INTRAVENOUS | Status: AC
  Administered 2018-03-06: 0.25 mg via INTRAVENOUS

## 2018-03-06 NOTE — Patient Instructions (Signed)
[facility] Discharge Instructions for Patients Receiving Chemotherapy  Today you received the following chemotherapy agents:  Carboplatin & Gemzar  To help prevent nausea and vomiting after your treatment, we encourage you to take your nausea medication as prescribed.    If you develop nausea and vomiting that is not controlled by your nausea medication, call the clinic.   BELOW ARE SYMPTOMS THAT SHOULD BE REPORTED IMMEDIATELY:  *FEVER GREATER THAN 100.5 F  *CHILLS WITH OR WITHOUT FEVER  NAUSEA AND VOMITING THAT IS NOT CONTROLLED WITH YOUR NAUSEA MEDICATION  *UNUSUAL SHORTNESS OF BREATH  *UNUSUAL BRUISING OR BLEEDING  TENDERNESS IN MOUTH AND THROAT WITH OR WITHOUT PRESENCE OF ULCERS  *URINARY PROBLEMS  *BOWEL PROBLEMS  UNUSUAL RASH Items with * indicate a potential emergency and should be followed up as soon as possible.  Feel free to call the clinic should you have any questions or concerns. The clinic phone number is [phone].  Please show the CHEMO ALERT CARD at check-in to the Emergency Department and triage nurse.   

## 2018-03-08 ENCOUNTER — Inpatient Hospital Stay: Payer: BLUE CROSS/BLUE SHIELD

## 2018-03-08 ENCOUNTER — Ambulatory Visit
Admission: RE | Admit: 2018-03-08 | Discharge: 2018-03-08 | Disposition: A | Discharge status: Home / Self Care | Payer: BLUE CROSS/BLUE SHIELD | Source: Ambulatory Visit | Attending: Hematology and Oncology | Admitting: Hematology and Oncology

## 2018-03-08 VITALS — BP 149/85 | HR 90 | Temp 97.9°F | Resp 20

## 2018-03-08 DIAGNOSIS — C50012 Malignant neoplasm of nipple and areola, left female breast: Secondary | ICD-10-CM

## 2018-03-08 DIAGNOSIS — N6331 Unspecified lump in axillary tail of the right breast: Secondary | ICD-10-CM

## 2018-03-08 MED ORDER — GADOBENATE DIMEGLUMINE 529 MG/ML IV SOLN
20.0000 mL | Freq: Once | INTRAVENOUS | Status: AC | PRN
  Administered 2018-03-08: 20 mL via INTRAVENOUS

## 2018-03-08 MED ORDER — PEGFILGRASTIM-CBQV 6 MG/0.6ML ~~LOC~~ SOSY
6.0000 mg | PREFILLED_SYRINGE | Freq: Once | SUBCUTANEOUS | Status: AC
  Administered 2018-03-08: 6 mg via SUBCUTANEOUS

## 2018-03-08 MED ORDER — PEGFILGRASTIM-CBQV 6 MG/0.6ML ~~LOC~~ SOSY
PREFILLED_SYRINGE | SUBCUTANEOUS | Status: AC
  Filled 2018-03-08: qty 0.6

## 2018-03-08 NOTE — Patient Instructions (Signed)
Pegfilgrastim injection What is this medicine? PEGFILGRASTIM (PEG fil gra stim) is a long-acting granulocyte colony-stimulating factor that stimulates the growth of neutrophils, a type of white blood cell important in the body's fight against infection. It is used to reduce the incidence of fever and infection in patients with certain types of cancer who are receiving chemotherapy that affects the bone marrow, and to increase survival after being exposed to high doses of radiation. This medicine may be used for other purposes; ask your health care provider or pharmacist if you have questions. COMMON BRAND NAME(S): Neulasta What should I tell my health care provider before I take this medicine? They need to know if you have any of these conditions: -kidney disease -latex allergy -ongoing radiation therapy -sickle cell disease -skin reactions to acrylic adhesives (On-Body Injector only) -an unusual or allergic reaction to pegfilgrastim, filgrastim, other medicines, foods, dyes, or preservatives -pregnant or trying to get pregnant -breast-feeding How should I use this medicine? This medicine is for injection under the skin. If you get this medicine at home, you will be taught how to prepare and give the pre-filled syringe or how to use the On-body Injector. Refer to the patient Instructions for Use for detailed instructions. Use exactly as directed. Tell your healthcare provider immediately if you suspect that the On-body Injector may not have performed as intended or if you suspect the use of the On-body Injector resulted in a missed or partial dose. It is important that you put your used needles and syringes in a special sharps container. Do not put them in a trash can. If you do not have a sharps container, call your pharmacist or healthcare provider to get one. Talk to your pediatrician regarding the use of this medicine in children. While this drug may be prescribed for selected conditions,  precautions do apply. Overdosage: If you think you have taken too much of this medicine contact a poison control center or emergency room at once. NOTE: This medicine is only for you. Do not share this medicine with others. What if I miss a dose? It is important not to miss your dose. Call your doctor or health care professional if you miss your dose. If you miss a dose due to an On-body Injector failure or leakage, a new dose should be administered as soon as possible using a single prefilled syringe for manual use. What may interact with this medicine? Interactions have not been studied. Give your health care provider a list of all the medicines, herbs, non-prescription drugs, or dietary supplements you use. Also tell them if you smoke, drink alcohol, or use illegal drugs. Some items may interact with your medicine. This list may not describe all possible interactions. Give your health care provider a list of all the medicines, herbs, non-prescription drugs, or dietary supplements you use. Also tell them if you smoke, drink alcohol, or use illegal drugs. Some items may interact with your medicine. What should I watch for while using this medicine? You may need blood work done while you are taking this medicine. If you are going to need a MRI, CT scan, or other procedure, tell your doctor that you are using this medicine (On-Body Injector only). What side effects may I notice from receiving this medicine? Side effects that you should report to your doctor or health care professional as soon as possible: -allergic reactions like skin rash, itching or hives, swelling of the face, lips, or tongue -dizziness -fever -pain, redness, or irritation at site   where injected -pinpoint red spots on the skin -red or dark-brown urine -shortness of breath or breathing problems -stomach or side pain, or pain at the shoulder -swelling -tiredness -trouble passing urine or change in the amount of urine Side  effects that usually do not require medical attention (report to your doctor or health care professional if they continue or are bothersome): -bone pain -muscle pain This list may not describe all possible side effects. Call your doctor for medical advice about side effects. You may report side effects to FDA at 1-800-FDA-1088. Where should I keep my medicine? Keep out of the reach of children. Store pre-filled syringes in a refrigerator between 2 and 8 degrees C (36 and 46 degrees F). Do not freeze. Keep in carton to protect from light. Throw away this medicine if it is left out of the refrigerator for more than 48 hours. Throw away any unused medicine after the expiration date. NOTE: This sheet is a summary. It may not cover all possible information. If you have questions about this medicine, talk to your doctor, pharmacist, or health care provider.  2018 Elsevier/Gold Standard (2016-10-07 12:58:03)  

## 2018-03-15 ENCOUNTER — Other Ambulatory Visit: Payer: Self-pay | Admitting: General Surgery

## 2018-03-15 ENCOUNTER — Inpatient Hospital Stay (HOSPITAL_BASED_OUTPATIENT_CLINIC_OR_DEPARTMENT_OTHER): Payer: BLUE CROSS/BLUE SHIELD | Admitting: Hematology and Oncology

## 2018-03-15 VITALS — BP 154/96 | HR 83 | Temp 98.4°F | Resp 18 | Ht 60.56 in | Wt 225.3 lb

## 2018-03-15 DIAGNOSIS — C50012 Malignant neoplasm of nipple and areola, left female breast: Secondary | ICD-10-CM

## 2018-03-15 NOTE — Assessment & Plan Note (Addendum)
09/26/2017: Left breast upper outer quadrant within the lumpectomy bed: Fibrosis no malignancy, right axillary lymph node biopsy metastatic high-grade carcinoma ER 0%, PR 0%, Ki-67 90%, HER-2 negative ratio 1.11 (similar to previous ductal carcinoma)  Treatment summary:Carboplatin and gemcitabine days 1 and 8 every 3 weeks x6 cycles adjuvant therapy.  Breast MRI 03/08/2018: Interval decrease in the right axillary lymph node to 1.2 x 1.5 cm was previously 1.5 x 1.7 cm  Follow-up with Dr. Donne Hazel to discuss surgery with axillary lymph node dissection  Tumor board recommendation:  1. Adjuvant radiation therapy with Xeloda 2. We will need to keep the port in place to see if she needs any additional systemic therapy afterwards.  Patient will be going to [place] for a week starting 03/20/2018  Return to clinic after surgery.

## 2018-03-15 NOTE — Progress Notes (Signed)
Patient Care Team: Patient, [name] as PCP - General ([place]) Neldon Mc, MD as Surgeon (General Surgery) Everardo All, MD (Hematology and Oncology)  DIAGNOSIS:  Encounter Diagnosis  Name Primary?  . 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, unspecified estrogen receptor status ([place]) Yes    SUMMARY OF ONCOLOGIC HISTORY:   Breast cancer, IDC, Left, Stage III, Triple negative   06/30/2011 Initial Diagnosis    Breast cancer, IDC, Left, Stage III, Triple negative, 7.6 cm breast mass and palpable axillary mass      09/06/2011 Miscellaneous    BRCA 1 and 2: Negative      09/14/2011 - 11/23/2011 Neo-Adjuvant Chemotherapy    Dose dense FEC followed by dose dense Taxotere      12/21/2011 Surgery    Left lumpectomy: High-grade poorly differentiated IDC 1.7 cm with high-grade DCIS, margins negative, 3/7 lymph nodes positive, ER 0%, PR 0%, HER-2 negative ratio 1.33 T1CN1 stage IIb      12/31/2011 - 02/15/2012 Radiation Therapy    Radiation at 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  The Hereditary Gene Panel offered by [facility] includes sequencing and/or deletion duplication testing of the following 47 genes: APC, ATM, AXIN2, BARD1, BMPR1A, BRCA1, BRCA2, BRIP1, CDH1, CDK4, CDKN2A (p14ARF), CDKN2A (p16INK4a), CHEK2, CTNNA1, DICER1, EPCAM (Deletion/duplication testing only), GREM1 (promoter region deletion/duplication testing only), KIT, MEN1, MLH1, MSH2, MSH3, MSH6, MUTYH, NBN,  NF1, NHTL1, PALB2, PDGFRA, PMS2, POLD1, POLE, PTEN, RAD50, RAD51C, RAD51D, SDHB, SDHC, SDHD, SMAD4, SMARCA4. STK11, TP53, TSC1, TSC2, and VHL.  The following genes were evaluated for sequence changes only: SDHA and HOXB13 c.251G>A variant only. The report date is January 10, 2018.        CHIEF COMPLIANT: Follow-up after recent breast MRI  INTERVAL HISTORY: Jessica Simpson is a 47 year old lady with relapsed breast cancer who returns for discussion regarding the breast MRI.  She completed 6 cycles of carboplatin and gemcitabine.  The breast MRI was reviewed at the multidisciplinary tumor board this morning.  There has been interval shrinkage in the size of the lymph node but it has not completely gone.  She is recovering very well from the chemotherapy related toxicities.   REVIEW OF SYSTEMS:   Constitutional: Denies fevers, chills or abnormal weight loss Eyes: Denies blurriness of vision Ears, nose, mouth, throat, and face: Denies mucositis or sore throat Respiratory: Denies cough, dyspnea or wheezes Cardiovascular: Denies palpitation, chest discomfort Gastrointestinal:  Denies nausea, heartburn or change in bowel habits Skin: Denies abnormal skin rashes Lymphatics: Denies new lymphadenopathy or easy bruising Neurological:Denies numbness, tingling or new weaknesses Behavioral/Psych: Mood is stable, no new changes  Extremities: No lower extremity edema Breast:  denies any pain or lumps or nodules in either breasts All other systems were reviewed with the patient and are negative.  I have reviewed the past medical history, past surgical history, social history and family history with the patient and they are unchanged from previous note.  ALLERGIES:  is  allergic to codeine and lortab [hydrocodone-acetaminophen].  MEDICATIONS:  Current Outpatient Medications  Medication Sig Dispense Refill  . acetaminophen (TYLENOL) 500 MG tablet Take 500-1,000 mg by mouth every 6 (six) hours as  needed. For pain    . amLODipine (NORVASC) 5 MG tablet Take 5 mg by mouth daily.  6  . Cholecalciferol 5000 units capsule Take 1 capsule (5,000 Units total) by mouth daily.    Marland Kitchen lidocaine-prilocaine (EMLA) cream Apply to affected area once 30 g 3  . loratadine (CLARITIN) 10 MG tablet Take 10 mg by mouth daily as needed (for seasonal allergies (Spring)).    Marland Kitchen LORazepam (ATIVAN) 0.5 MG tablet Take 1 tablet (0.5 mg total) by mouth at bedtime as needed for sleep. 30 tablet 0  . ondansetron (ZOFRAN) 8 MG tablet Take 1 tablet (8 mg total) by mouth 2 (two) times daily as needed for refractory nausea / vomiting. Start on day 3 after chemotherapy. 30 tablet 1  . prochlorperazine (COMPAZINE) 10 MG tablet Take 1 tablet (10 mg total) by mouth every 6 (six) hours as needed (Nausea or vomiting). 30 tablet 1  . traMADol (ULTRAM) 50 MG tablet Take 2 tablets (100 mg total) by mouth every 6 (six) hours as needed. 10 tablet 0   No current facility-administered medications for this visit.    Facility-Administered Medications Ordered in Other Visits  Medication Dose Route Frequency Provider Last Rate Last Dose  . lactated ringers infusion    Continuous PRN Kerby Less, CRNA        PHYSICAL EXAMINATION: ECOG PERFORMANCE STATUS: 1 - Symptomatic but completely ambulatory  Vitals:   03/15/18 1117  BP: (!) 154/96  Pulse: 83  Resp: 18  Temp: 98.4 F (36.9 C)  SpO2: 100%   Filed Weights   03/15/18 1117  Weight: 225 lb 4.8 oz (102.2 kg)    GENERAL:alert, no distress and comfortable SKIN: skin color, texture, turgor are normal, no rashes or significant lesions EYES: normal, Conjunctiva are pink and non-injected, sclera clear OROPHARYNX:no exudate, no erythema and lips, buccal mucosa, and tongue normal  NECK: supple, thyroid normal size, non-tender, without nodularity LYMPH:  no palpable lymphadenopathy in the cervical, axillary or inguinal LUNGS: clear to auscultation and percussion with normal  breathing effort HEART: regular rate & rhythm and no murmurs and no lower extremity edema ABDOMEN:abdomen soft, non-tender and normal bowel sounds MUSCULOSKELETAL:no cyanosis of digits and no clubbing  NEURO: alert & oriented x 3 with fluent speech, no focal motor/sensory deficits EXTREMITIES: No lower extremity edema  LABORATORY DATA:  I have reviewed the data as listed CMP Latest Ref Rng & Units 03/06/2018 02/27/2018 02/13/2018  Glucose 70 - 140 mg/dL 88 105 108  BUN 7 - 26 mg/dL 14 14 12   Creatinine 0.60 - 1.10 mg/dL 0.85 1.03 0.86  Sodium 136 - 145 mmol/L 141 139 140  Potassium 3.5 - 5.1 mmol/L 3.7 3.4(L) 3.5  Chloride 98 - 109 mmol/L 106 106 107  CO2 22 - 29 mmol/L 26 27 23   Calcium 8.4 - 10.4 mg/dL 9.9 9.8 9.7  Total Protein 6.4 - 8.3 g/dL 7.6 7.6 7.4  Total Bilirubin 0.2 - 1.2 mg/dL 0.3 0.3 <0.2(L)  Alkaline Phos 40 - 150 U/L 119 137 124  AST 5 - 34 U/L 23 24 22   ALT 0 - 55 U/L 41 41 42    Lab Results  Component Value Date   WBC 12.5 (H) 03/06/2018   HGB 8.5 (L) 03/06/2018   HCT 26.3 (L)  03/06/2018   MCV 87.7 03/06/2018   PLT 227 03/06/2018   NEUTROABS 9.7 (H) 03/06/2018    ASSESSMENT & PLAN:  Breast cancer, IDC, Left, Stage III, Triple negative 09/26/2017: Left breast upper outer quadrant within the lumpectomy bed: Fibrosis no malignancy, right axillary lymph node biopsy metastatic high-grade carcinoma ER 0%, PR 0%, Ki-67 90%, HER-2 negative ratio 1.11 (similar to previous ductal carcinoma)  Treatment summary:Carboplatin and gemcitabine days 1 and 8 every 3 weeks x6 cycles adjuvant therapy.  Breast MRI 03/08/2018: Interval decrease in the right axillary lymph node to 1.2 x 1.5 cm was previously 1.5 x 1.7 cm  Follow-up with Dr. Donne Hazel to discuss surgery with axillary lymph node dissection  Tumor board recommendation:  1. Adjuvant radiation therapy with Xeloda 2. We will need to keep the port in place to see if she needs any additional systemic therapy afterwards.   Most likely with [facility]  Patient will be going to [place] for a week starting 03/20/2018 Discussed her case in the tumor board and with Dr. Donne Hazel extensively. Return to clinic after surgery.    No orders of the defined types were placed in this encounter.  The patient has a good understanding of the overall plan. she agrees with it. she will call with any problems that may develop before the next visit here.   Harriette Ohara, MD 03/15/18

## 2018-03-31 ENCOUNTER — Encounter: Payer: Self-pay | Admitting: Radiation Oncology

## 2018-03-31 ENCOUNTER — Encounter (HOSPITAL_COMMUNITY)
Admission: RE | Admit: 2018-03-31 | Discharge: 2018-03-31 | Disposition: A | Discharge status: Home / Self Care | Payer: BLUE CROSS/BLUE SHIELD | Source: Ambulatory Visit | Attending: General Surgery | Admitting: General Surgery

## 2018-03-31 ENCOUNTER — Encounter (HOSPITAL_COMMUNITY): Payer: Self-pay

## 2018-03-31 DIAGNOSIS — Z01812 Encounter for preprocedural laboratory examination: Secondary | ICD-10-CM | POA: Diagnosis present

## 2018-03-31 LAB — CBC
HEMATOCRIT: 34.2 % — AB (ref 36.0–46.0)
Hemoglobin: 10.5 g/dL — ABNORMAL LOW (ref 12.0–15.0)
MCH: 28.1 pg (ref 26.0–34.0)
MCHC: 30.7 g/dL (ref 30.0–36.0)
MCV: 91.4 fL (ref 78.0–100.0)
Platelets: 356 10*3/uL (ref 150–400)
RBC: 3.74 MIL/uL — ABNORMAL LOW (ref 3.87–5.11)
RDW: 15.9 % — AB (ref 11.5–15.5)
WBC: 5.3 10*3/uL (ref 4.0–10.5)

## 2018-03-31 LAB — BASIC METABOLIC PANEL
ANION GAP: 12 (ref 5–15)
BUN: 12 mg/dL (ref 6–20)
CALCIUM: 9.9 mg/dL (ref 8.9–10.3)
CO2: 24 mmol/L (ref 22–32)
Chloride: 106 mmol/L (ref 101–111)
Creatinine, Ser: 0.98 mg/dL (ref 0.44–1.00)
GFR calc Af Amer: >60 mL/min (ref >60)
GFR calc non Af Amer: >60 mL/min (ref >60)
GLUCOSE: 102 mg/dL — AB (ref 65–99)
Potassium: 3.8 mmol/L (ref 3.5–5.1)
Sodium: 142 mmol/L (ref 135–145)

## 2018-03-31 NOTE — Pre-Procedure Instructions (Signed)
    Jessica Simpson  03/31/2018      [facility] - [place], [place] [address] Phone: [phone] Fax: [phone]  [id number] [name], [place] [address] Phone: [phone] Fax: [phone]    Your procedure is scheduled on 04/06/18.  Report to [facility] Admitting at 730 A.M.  Call this number if you have problems the morning of surgery:  [phone]   Remember:  No food after midnight.     Take these medicines the morning of surgery with A SIP OF WATER --tylenol,norvasc    Do not wear jewelry, make-up or nail polish.  Do not wear lotions, powders, or perfumes, or deodorant.  Do not shave 48 hours prior to surgery.  Men may shave face and neck.  Do not bring valuables to the hospital.  [facility] is not responsible for any belongings or valuables.  Contacts, dentures or bridgework may not be worn into surgery.  Leave your suitcase in the car.  After surgery it may be brought to your room.  For patients admitted to the hospital, discharge time will be determined by your treatment team.  Patients discharged the day of surgery will not be allowed to drive home.   Name and phone number of your driver:   Do not take any aspirin,anti-inflammatories,vitamins,or herbal supplements 5-7 days prior to surgery.Please complete your PRE-SURGERY ENSURE that was given to before you leave your house the morning of surgery.  Please, if able, drink it in one setting. DO NOT SIP. Special instructions:   Please read over the following fact sheets that you were given.

## 2018-04-06 ENCOUNTER — Ambulatory Visit (HOSPITAL_COMMUNITY): Payer: BLUE CROSS/BLUE SHIELD | Admitting: Anesthesiology

## 2018-04-06 ENCOUNTER — Encounter (HOSPITAL_COMMUNITY)
Admission: RE | Disposition: A | Discharge status: Home / Self Care | Payer: Self-pay | Source: Ambulatory Visit | Attending: General Surgery

## 2018-04-06 ENCOUNTER — Observation Stay (HOSPITAL_COMMUNITY)
Admission: RE | Admit: 2018-04-06 | Discharge: 2018-04-07 | Disposition: A | Discharge status: Home / Self Care | Payer: BLUE CROSS/BLUE SHIELD | Source: Ambulatory Visit | Attending: General Surgery | Admitting: General Surgery

## 2018-04-06 ENCOUNTER — Encounter (HOSPITAL_COMMUNITY): Payer: Self-pay | Admitting: Urology

## 2018-04-06 ENCOUNTER — Other Ambulatory Visit: Payer: Self-pay

## 2018-04-06 DIAGNOSIS — E669 Obesity, unspecified: Secondary | ICD-10-CM | POA: Insufficient documentation

## 2018-04-06 DIAGNOSIS — Z79899 Other long term (current) drug therapy: Secondary | ICD-10-CM | POA: Diagnosis not present

## 2018-04-06 DIAGNOSIS — Z6838 Body mass index (BMI) 38.0-38.9, adult: Secondary | ICD-10-CM | POA: Diagnosis not present

## 2018-04-06 DIAGNOSIS — I10 Essential (primary) hypertension: Secondary | ICD-10-CM | POA: Diagnosis not present

## 2018-04-06 DIAGNOSIS — C773 Secondary and unspecified malignant neoplasm of axilla and upper limb lymph nodes: Secondary | ICD-10-CM | POA: Diagnosis not present

## 2018-04-06 DIAGNOSIS — Z923 Personal history of irradiation: Secondary | ICD-10-CM | POA: Insufficient documentation

## 2018-04-06 DIAGNOSIS — Z853 Personal history of malignant neoplasm of breast: Secondary | ICD-10-CM | POA: Insufficient documentation

## 2018-04-06 DIAGNOSIS — C50911 Malignant neoplasm of unspecified site of right female breast: Secondary | ICD-10-CM | POA: Diagnosis present

## 2018-04-06 DIAGNOSIS — R590 Localized enlarged lymph nodes: Secondary | ICD-10-CM | POA: Diagnosis present

## 2018-04-06 HISTORY — PX: AXILLARY LYMPH NODE BIOPSY: SHX5737

## 2018-04-06 HISTORY — PX: AXILLARY LYMPH NODE DISSECTION: SHX5229

## 2018-04-06 LAB — POCT PREGNANCY, URINE: PREG TEST UR: NEGATIVE

## 2018-04-06 SURGERY — LYMPHADENECTOMY, AXILLARY
Anesthesia: General | Site: Axilla | Laterality: Right

## 2018-04-06 MED ORDER — FENTANYL CITRATE (PF) 250 MCG/5ML IJ SOLN
INTRAMUSCULAR | Status: AC
  Filled 2018-04-06: qty 5

## 2018-04-06 MED ORDER — GABAPENTIN 300 MG PO CAPS
ORAL_CAPSULE | ORAL | Status: AC
  Administered 2018-04-06: 300 mg via ORAL
  Filled 2018-04-06: qty 1

## 2018-04-06 MED ORDER — MORPHINE SULFATE (PF) 2 MG/ML IV SOLN: 2.0000 mg | INTRAVENOUS | Status: DC | PRN

## 2018-04-06 MED ORDER — FENTANYL CITRATE (PF) 100 MCG/2ML IJ SOLN: 25.0000 ug | INTRAMUSCULAR | Status: DC | PRN

## 2018-04-06 MED ORDER — PROCHLORPERAZINE MALEATE 10 MG PO TABS
10.0000 mg | ORAL_TABLET | Freq: Four times a day (QID) | ORAL | Status: DC | PRN
  Filled 2018-04-06: qty 1

## 2018-04-06 MED ORDER — GABAPENTIN 300 MG PO CAPS
300.0000 mg | ORAL_CAPSULE | ORAL | Status: AC
  Administered 2018-04-06: 300 mg via ORAL

## 2018-04-06 MED ORDER — CEFAZOLIN SODIUM-DEXTROSE 2-4 GM/100ML-% IV SOLN: 2.0000 g | INTRAVENOUS | Status: DC

## 2018-04-06 MED ORDER — HEMOSTATIC AGENTS (NO CHARGE) OPTIME
TOPICAL | Status: DC | PRN
  Administered 2018-04-06 (×2): 1 via TOPICAL

## 2018-04-06 MED ORDER — LIDOCAINE 2% (20 MG/ML) 5 ML SYRINGE
INTRAMUSCULAR | Status: AC
  Filled 2018-04-06: qty 5

## 2018-04-06 MED ORDER — FENTANYL CITRATE (PF) 100 MCG/2ML IJ SOLN
50.0000 ug | Freq: Once | INTRAMUSCULAR | Status: AC
  Administered 2018-04-06: 50 ug via INTRAVENOUS

## 2018-04-06 MED ORDER — ONDANSETRON HCL 4 MG/2ML IJ SOLN
INTRAMUSCULAR | Status: DC | PRN
  Administered 2018-04-06: 4 mg via INTRAVENOUS

## 2018-04-06 MED ORDER — 0.9 % SODIUM CHLORIDE (POUR BTL) OPTIME
TOPICAL | Status: DC | PRN
  Administered 2018-04-06: 1000 mL

## 2018-04-06 MED ORDER — PROPOFOL 10 MG/ML IV BOLUS
INTRAVENOUS | Status: AC
Start: 2018-04-06 — End: ?
  Filled 2018-04-06: qty 20

## 2018-04-06 MED ORDER — FENTANYL CITRATE (PF) 100 MCG/2ML IJ SOLN
INTRAMUSCULAR | Status: DC | PRN
  Administered 2018-04-06: 50 ug via INTRAVENOUS
  Administered 2018-04-06: 25 ug via INTRAVENOUS

## 2018-04-06 MED ORDER — FENTANYL CITRATE (PF) 100 MCG/2ML IJ SOLN
INTRAMUSCULAR | Status: AC
  Administered 2018-04-06: 50 ug via INTRAVENOUS
  Filled 2018-04-06: qty 2

## 2018-04-06 MED ORDER — BUPIVACAINE-EPINEPHRINE 0.25% -1:200000 IJ SOLN
INTRAMUSCULAR | Status: DC | PRN
  Administered 2018-04-06: 7 mL

## 2018-04-06 MED ORDER — ACETAMINOPHEN 500 MG PO TABS
1000.0000 mg | ORAL_TABLET | Freq: Four times a day (QID) | ORAL | Status: DC
  Administered 2018-04-06 – 2018-04-07 (×3): 1000 mg via ORAL
  Filled 2018-04-06 (×3): qty 2

## 2018-04-06 MED ORDER — LORATADINE 10 MG PO TABS: 10.0000 mg | ORAL_TABLET | Freq: Every day | ORAL | Status: DC | PRN

## 2018-04-06 MED ORDER — LIDOCAINE HCL (CARDIAC) PF 100 MG/5ML IV SOSY
PREFILLED_SYRINGE | INTRAVENOUS | Status: DC | PRN
  Administered 2018-04-06: 60 mg via INTRAVENOUS

## 2018-04-06 MED ORDER — MIDAZOLAM HCL 2 MG/2ML IJ SOLN
INTRAMUSCULAR | Status: AC
  Administered 2018-04-06: 2 mg via INTRAVENOUS
  Filled 2018-04-06: qty 2

## 2018-04-06 MED ORDER — ONDANSETRON 4 MG PO TBDP: 4.0000 mg | ORAL_TABLET | Freq: Four times a day (QID) | ORAL | Status: DC | PRN

## 2018-04-06 MED ORDER — KETOROLAC TROMETHAMINE 15 MG/ML IJ SOLN
15.0000 mg | INTRAMUSCULAR | Status: AC
  Administered 2018-04-06: 15 mg via INTRAVENOUS

## 2018-04-06 MED ORDER — CEFAZOLIN SODIUM-DEXTROSE 2-4 GM/100ML-% IV SOLN
INTRAVENOUS | Status: AC
  Filled 2018-04-06: qty 100

## 2018-04-06 MED ORDER — METHOCARBAMOL 500 MG PO TABS
500.0000 mg | ORAL_TABLET | Freq: Three times a day (TID) | ORAL | Status: DC
  Administered 2018-04-06 (×2): 500 mg via ORAL
  Filled 2018-04-06 (×2): qty 1

## 2018-04-06 MED ORDER — PROPOFOL 10 MG/ML IV BOLUS
INTRAVENOUS | Status: AC
  Filled 2018-04-06: qty 20

## 2018-04-06 MED ORDER — PROCHLORPERAZINE EDISYLATE 10 MG/2ML IJ SOLN: 5.0000 mg | Freq: Four times a day (QID) | INTRAMUSCULAR | Status: DC | PRN

## 2018-04-06 MED ORDER — ACETAMINOPHEN 500 MG PO TABS
1000.0000 mg | ORAL_TABLET | ORAL | Status: AC
  Administered 2018-04-06: 1000 mg via ORAL

## 2018-04-06 MED ORDER — AMLODIPINE BESYLATE 5 MG PO TABS: 5.0000 mg | ORAL_TABLET | Freq: Every day | ORAL | Status: DC

## 2018-04-06 MED ORDER — OXYCODONE HCL 5 MG PO TABS
5.0000 mg | ORAL_TABLET | ORAL | Status: DC | PRN
  Administered 2018-04-06: 5 mg via ORAL

## 2018-04-06 MED ORDER — LACTATED RINGERS IV SOLN
INTRAVENOUS | Status: DC
  Administered 2018-04-06: 08:00:00 via INTRAVENOUS

## 2018-04-06 MED ORDER — KETOROLAC TROMETHAMINE 15 MG/ML IJ SOLN
INTRAMUSCULAR | Status: AC
  Administered 2018-04-06: 15 mg via INTRAVENOUS
  Filled 2018-04-06: qty 1

## 2018-04-06 MED ORDER — MIDAZOLAM HCL 2 MG/2ML IJ SOLN
INTRAMUSCULAR | Status: AC
Start: 2018-04-06 — End: ?
  Filled 2018-04-06: qty 2

## 2018-04-06 MED ORDER — PHENYLEPHRINE HCL 10 MG/ML IJ SOLN
INTRAMUSCULAR | Status: DC | PRN
  Administered 2018-04-06 (×3): 80 ug via INTRAVENOUS

## 2018-04-06 MED ORDER — ENOXAPARIN SODIUM 40 MG/0.4ML ~~LOC~~ SOLN: 40.0000 mg | SUBCUTANEOUS | Status: DC

## 2018-04-06 MED ORDER — ENSURE PRE-SURGERY PO LIQD
296.0000 mL | Freq: Once | ORAL | Status: DC
  Filled 2018-04-06 (×2): qty 296

## 2018-04-06 MED ORDER — BUPIVACAINE-EPINEPHRINE (PF) 0.5% -1:200000 IJ SOLN
INTRAMUSCULAR | Status: DC | PRN
  Administered 2018-04-06: 30 mL

## 2018-04-06 MED ORDER — SIMETHICONE 80 MG PO CHEW: 40.0000 mg | CHEWABLE_TABLET | Freq: Four times a day (QID) | ORAL | Status: DC | PRN

## 2018-04-06 MED ORDER — SCOPOLAMINE 1 MG/3DAYS TD PT72
MEDICATED_PATCH | TRANSDERMAL | Status: AC
  Filled 2018-04-06: qty 1

## 2018-04-06 MED ORDER — LORAZEPAM 0.5 MG PO TABS
0.5000 mg | ORAL_TABLET | Freq: Every evening | ORAL | Status: DC | PRN
Start: 2018-04-06 — End: 2018-04-07

## 2018-04-06 MED ORDER — ONDANSETRON HCL 4 MG/2ML IJ SOLN: 4.0000 mg | Freq: Four times a day (QID) | INTRAMUSCULAR | Status: DC | PRN

## 2018-04-06 MED ORDER — DEXAMETHASONE SODIUM PHOSPHATE 10 MG/ML IJ SOLN
INTRAMUSCULAR | Status: DC | PRN
  Administered 2018-04-06: 5 mg via INTRAVENOUS

## 2018-04-06 MED ORDER — ACETAMINOPHEN 500 MG PO TABS
ORAL_TABLET | ORAL | Status: AC
  Administered 2018-04-06: 1000 mg via ORAL
  Filled 2018-04-06: qty 2

## 2018-04-06 MED ORDER — PROMETHAZINE HCL 25 MG/ML IJ SOLN: 6.2500 mg | INTRAMUSCULAR | Status: DC | PRN

## 2018-04-06 MED ORDER — SCOPOLAMINE 1 MG/3DAYS TD PT72
MEDICATED_PATCH | TRANSDERMAL | Status: DC | PRN
  Administered 2018-04-06: 1 via TRANSDERMAL

## 2018-04-06 MED ORDER — MIDAZOLAM HCL 2 MG/2ML IJ SOLN
2.0000 mg | Freq: Once | INTRAMUSCULAR | Status: AC
  Administered 2018-04-06: 2 mg via INTRAVENOUS

## 2018-04-06 MED ORDER — PROPOFOL 10 MG/ML IV BOLUS
INTRAVENOUS | Status: DC | PRN
  Administered 2018-04-06: 180 mg via INTRAVENOUS

## 2018-04-06 MED ORDER — SODIUM CHLORIDE 0.9 % IV SOLN
INTRAVENOUS | Status: DC
  Administered 2018-04-06: 14:00:00 via INTRAVENOUS

## 2018-04-06 SURGICAL SUPPLY — 53 items
APPLIER CLIP 9.375 MED OPEN (MISCELLANEOUS) ×6
BIOPATCH RED 1 DISK 7.0 (GAUZE/BANDAGES/DRESSINGS) ×2 IMPLANT
BLADE CLIPPER SURG (BLADE) IMPLANT
CANISTER SUCT 3000ML PPV (MISCELLANEOUS) ×2 IMPLANT
CHLORAPREP W/TINT 26ML (MISCELLANEOUS) ×2 IMPLANT
CLIP APPLIE 9.375 MED OPEN (MISCELLANEOUS) ×3 IMPLANT
CONT SPEC 4OZ CLIKSEAL STRL BL (MISCELLANEOUS) ×4 IMPLANT
COVER SURGICAL LIGHT HANDLE (MISCELLANEOUS) ×2 IMPLANT
DECANTER SPIKE VIAL GLASS SM (MISCELLANEOUS) IMPLANT
DERMABOND ADVANCED (GAUZE/BANDAGES/DRESSINGS) ×1
DERMABOND ADVANCED .7 DNX12 (GAUZE/BANDAGES/DRESSINGS) ×1 IMPLANT
DRAIN CHANNEL 19F RND (DRAIN) ×2 IMPLANT
DRAPE LAPAROTOMY 100X72 PEDS (DRAPES) ×2 IMPLANT
DRSG TEGADERM 4X4.75 (GAUZE/BANDAGES/DRESSINGS) ×2 IMPLANT
ELECT BLADE 4.0 EZ CLEAN MEGAD (MISCELLANEOUS) ×2
ELECT CAUTERY BLADE 6.4 (BLADE) ×2 IMPLANT
ELECT REM PT RETURN 9FT ADLT (ELECTROSURGICAL) ×2
ELECTRODE BLDE 4.0 EZ CLN MEGD (MISCELLANEOUS) ×1 IMPLANT
ELECTRODE REM PT RTRN 9FT ADLT (ELECTROSURGICAL) ×1 IMPLANT
EVACUATOR SILICONE 100CC (DRAIN) ×2 IMPLANT
GAUZE SPONGE 4X4 16PLY XRAY LF (GAUZE/BANDAGES/DRESSINGS) ×2 IMPLANT
GLOVE BIO SURGEON STRL SZ7 (GLOVE) ×2 IMPLANT
GLOVE BIOGEL PI IND STRL 7.5 (GLOVE) ×1 IMPLANT
GLOVE BIOGEL PI INDICATOR 7.5 (GLOVE) ×1
GOWN STRL REUS W/ TWL LRG LVL3 (GOWN DISPOSABLE) ×2 IMPLANT
GOWN STRL REUS W/TWL LRG LVL3 (GOWN DISPOSABLE) ×2
HEMOSTAT ARISTA ABSORB 3G PWDR (MISCELLANEOUS) ×2 IMPLANT
HEMOSTAT SURGICEL 2X14 (HEMOSTASIS) ×2 IMPLANT
KIT BASIN OR (CUSTOM PROCEDURE TRAY) ×2 IMPLANT
KIT TURNOVER KIT B (KITS) ×2 IMPLANT
NEEDLE HYPO 25GX1X1/2 BEV (NEEDLE) IMPLANT
NS IRRIG 1000ML POUR BTL (IV SOLUTION) ×2 IMPLANT
PACK SURGICAL SETUP 50X90 (CUSTOM PROCEDURE TRAY) ×2 IMPLANT
PAD ARMBOARD 7.5X6 YLW CONV (MISCELLANEOUS) ×4 IMPLANT
PENCIL BUTTON HOLSTER BLD 10FT (ELECTRODE) ×2 IMPLANT
SPONGE LAP 4X18 RFD (DISPOSABLE) ×2 IMPLANT
STAPLER VISISTAT 35W (STAPLE) IMPLANT
STOCKINETTE IMPERVIOUS 9X36 MD (GAUZE/BANDAGES/DRESSINGS) IMPLANT
STRIP CLOSURE SKIN 1/2X4 (GAUZE/BANDAGES/DRESSINGS) ×2 IMPLANT
SUT ETHILON 3 0 PS 1 (SUTURE) ×2 IMPLANT
SUT MNCRL AB 4-0 PS2 18 (SUTURE) ×2 IMPLANT
SUT SILK 3 0 (SUTURE) ×1
SUT SILK 3-0 18XBRD TIE 12 (SUTURE) ×1 IMPLANT
SUT VIC AB 2-0 SH 27 (SUTURE) ×2
SUT VIC AB 2-0 SH 27X BRD (SUTURE) ×2 IMPLANT
SUT VIC AB 3-0 SH 27 (SUTURE) ×1
SUT VIC AB 3-0 SH 27X BRD (SUTURE) ×1 IMPLANT
SYR BULB 3OZ (MISCELLANEOUS) ×2 IMPLANT
SYR CONTROL 10ML LL (SYRINGE) IMPLANT
TOWEL OR 17X24 6PK STRL BLUE (TOWEL DISPOSABLE) ×2 IMPLANT
TOWEL OR 17X26 10 PK STRL BLUE (TOWEL DISPOSABLE) ×2 IMPLANT
TUBE CONNECTING 12X1/4 (SUCTIONS) ×2 IMPLANT
YANKAUER SUCT BULB TIP NO VENT (SUCTIONS) ×2 IMPLANT

## 2018-04-06 NOTE — H&P (Signed)
  47 yof referred by Mike Craze for right axillary node core biopsy c/w breast cancer. she has history of a stage III TNBC in 2012 that was treated with primary chemotherapy followed by lump/alnd. there was residual cancer and 3/7 nodes had remaining cancer. she then underwent radiation therapy. she had a right sided subclavian port at that time. she did undergo genetic testing then that by report was negative for brca 1 and 2. she does have a 47 yo daughter. 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 she does not have any family help there. she noted a low lying axillary breast mass recently. she had done fine until then. she underwent mm november 2018. her density is c. she was noted to have new grouped pleomorphic calcs in the lumpectomy site of the left breast spanning 6 mm. the right breast has a morphologically abnl low lying node present. US shows an abnl node in right axillary tail with no addiitonal nodes noted. core biopsy of the left is fat necrosis, fibrosis and calcs and this is concordant. the right axillary node is metastatic high grade carcinoma (will ask if any lymphoid tissue present) that is TN and Ki is 90%. she has undergone pet that shows hypermetabolic 1.3 cm right axillary node, postop findings on left. there is question of possible right ovarian activity. she had an mri initially that showed a 1.7 cm right axillary node with no breast lesions on other side. repeat mri shows a 1.2x1.5 cm right axillary node otherwise negaitve. she has also undergone panel testing that is negative. she has tolerated chemo well   Past Surgical History Rolm Bookbinder, MD; 03/14/2018 2:56 PM) Anal Fissure Repair  Breast Biopsy  Bilateral. Breast Mass; Local Excision  Left.  Diagnostic Studies History Rolm Bookbinder, MD; 03/14/2018 2:56 PM) Colonoscopy  1-5 years ago Mammogram  within last year Pap Smear  1-5 years ago  Allergies Levonne Spiller,  [place]; 03/14/2018 2:15 PM) CODEINE  Allergies Reconciled   Medication History (Danielle Gerrigner, CMA; 03/14/2018 2:15 PM) Acetaminophen ('500MG'$  Tablet, Oral) Active. Norvasc ('5MG'$  Tablet, Oral) Active. Cholecalciferol (5000UNIT Capsule, Oral) Active. Claritin ('10MG'$  Tablet, Oral) Active. Medications Reconciled  Social History Rolm Bookbinder, MD; 03/14/2018 2:56 PM) Caffeine use  Carbonated beverages. No alcohol use  No drug use  Tobacco use  Never smoker.  Family History Rolm Bookbinder, MD; 03/14/2018 2:56 PM) Heart disease in female family member before age 39  Hypertension  Mother, Sister. Migraine Headache  Mother.  ROS Negative  Vitals (Danielle Gerrigner CMA; 03/14/2018 2:15 PM) 03/14/2018 2:15 PM Weight: 223.38 lb Height: 65in Body Surface Area: 2.07 m Body Mass Index: 37.17 kg/m  Temp.: 98.85F(Oral)  Pulse: 110 (Regular)  BP: 160/90 (Sitting, Right Arm, Standard) Physical Exam Rolm Bookbinder MD; 03/14/2018 2:54 PM) Breast Nipples-No Discharge. Breast Lump-No Palpable Breast Mass. Lymphatic Note: i do not palpate any right axillary adenopathy now cv rrr Lungs clear  Assessment & Plan Rolm Bookbinder MD; 03/14/2018 2:55 PM) BREAST CANCER METASTASIZED TO AXILLARY LYMPH NODE (C50.919) Story: Right ALND I think with negative genetics, negative mri for bilateral breasts times two, pet with just right axillary node that she could be treated with right alnd alone. we discussed options today. will discuss at conference tomorrow. I will call her back. discussed alnd with drain and overnight hospital stay about 2-3 weeks from now ( she finished chemo last week).

## 2018-04-06 NOTE — Anesthesia Postprocedure Evaluation (Signed)
Anesthesia Post Note  Patient: Jessica Simpson  Procedure(s) Performed: RIGHT AXILLARY LYMPH NODE DISSECTION (Right Axilla)     Patient location during evaluation: PACU Anesthesia Type: General Level of consciousness: awake and alert Pain management: pain level controlled Vital Signs Assessment: post-procedure vital signs reviewed and stable Respiratory status: spontaneous breathing, nonlabored ventilation and respiratory function stable Cardiovascular status: blood pressure returned to baseline and stable Postop Assessment: no apparent nausea or vomiting Anesthetic complications: no    Last Vitals:  Vitals:   04/06/18 1230 04/06/18 1257  BP:  (!) 157/85  Pulse: 84 78  Resp: 16 14  Temp:  36.7 C  SpO2: 97% 99%    Last Pain:  Vitals:   04/06/18 1257  TempSrc: Oral  PainSc:                  Audry Pili

## 2018-04-06 NOTE — Anesthesia Preprocedure Evaluation (Addendum)
Anesthesia Evaluation  Patient identified by MRN, date of birth, ID band Patient awake    Reviewed: Allergy & Precautions, H&P , NPO status , Patient's Chart, lab work & pertinent test results  Airway Mallampati: III  TM Distance: >3 FB Neck ROM: Full    Dental no notable dental hx. (+) Teeth Intact, Dental Advisory Given   Pulmonary neg pulmonary ROS,    Pulmonary exam normal breath sounds clear to auscultation       Cardiovascular Exercise Tolerance: Good hypertension, Pt. on medications  Rhythm:Regular Rate:Normal     Neuro/Psych negative neurological ROS  negative psych ROS   GI/Hepatic negative GI ROS, Neg liver ROS,   Endo/Other  Obesity  Renal/GU negative Renal ROS  negative genitourinary   Musculoskeletal   Abdominal   Peds  Hematology negative hematology ROS (+) anemia ,   Anesthesia Other Findings Breast cancer  Reproductive/Obstetrics                             Anesthesia Physical  Anesthesia Plan  ASA: III  Anesthesia Plan: General   Post-op Pain Management:  Regional for Post-op pain   Induction: Intravenous  PONV Risk Score and Plan: 4 or greater and Dexamethasone, Ondansetron, Midazolam, Treatment may vary due to age or medical condition and Scopolamine patch - Pre-op  Airway Management Planned: LMA  Additional Equipment: None  Intra-op Plan:   Post-operative Plan: Extubation in OR  Informed Consent: I have reviewed the patients History and Physical, chart, labs and discussed the procedure including the risks, benefits and alternatives for the proposed anesthesia with the patient or authorized representative who has indicated his/her understanding and acceptance.   Dental advisory given  Plan Discussed with: CRNA and Anesthesiologist  Anesthesia Plan Comments:         Anesthesia Quick Evaluation

## 2018-04-06 NOTE — Transfer of Care (Signed)
Immediate Anesthesia Transfer of Care Note  Patient: Jessica Simpson  Procedure(s) Performed: RIGHT AXILLARY LYMPH NODE DISSECTION (Right Axilla)  Patient Location: PACU  Anesthesia Type:GA combined with regional for post-op pain  Level of Consciousness: drowsy  Airway & Oxygen Therapy: Patient Spontanous Breathing  Post-op Assessment: Report given to RN and Post -op Vital signs reviewed and stable  Post vital signs: Reviewed and stable  Last Vitals:  Vitals Value Taken Time  BP 144/89 04/06/2018 11:30 AM  Temp    Pulse 82 04/06/2018 11:32 AM  Resp 14 04/06/2018 11:32 AM  SpO2 100 % 04/06/2018 11:32 AM  Vitals shown include unvalidated device data.  Last Pain:  Vitals:   04/06/18 0805  TempSrc: Oral  PainSc:          Complications: No apparent anesthesia complications

## 2018-04-06 NOTE — Anesthesia Procedure Notes (Signed)
Procedure Name: LMA Insertion Date/Time: 04/06/2018 9:31 AM Performed by: Audry Pili, MD Pre-anesthesia Checklist: Patient identified, Emergency Drugs available, Suction available, Patient being monitored and Timeout performed Patient Re-evaluated:Patient Re-evaluated prior to induction Oxygen Delivery Method: Circle System Utilized Preoxygenation: Pre-oxygenation with 100% oxygen Induction Type: IV induction Ventilation: Mask ventilation without difficulty LMA: LMA inserted LMA Size: 4.0 Number of attempts: 1 Placement Confirmation: positive ETCO2 Tube secured with: Tape Dental Injury: Teeth and Oropharynx as per pre-operative assessment

## 2018-04-06 NOTE — Anesthesia Procedure Notes (Signed)
Anesthesia Regional Block: Pectoralis block   Pre-Anesthetic Checklist: ,, timeout performed, Correct Patient, Correct Site, Correct Laterality, Correct Procedure, Correct Position, risks and benefits discussed, surgical consent, pre-op evaluation,  At surgeon's request and post-op pain management  Laterality: Right  Prep: chloraprep       Needles:  Injection technique: Single-shot  Needle Type: Echogenic Needle     Needle Length: 9cm  Needle Gauge: 21     Additional Needles:   Narrative:  Start time: 04/06/2018 9:09 AM End time: 04/06/2018 9:14 AM Injection made incrementally with aspirations every 5 mL.  Performed by: Personally  Anesthesiologist: Audry Pili, MD  Additional Notes: No pain on injection. No increased resistance to injection. Injection made in 5cc increments. Good needle visualization. Patient tolerated the procedure well.

## 2018-04-06 NOTE — Discharge Instructions (Signed)
[facility], [place] [phone]  POST OP INSTRUCTIONS Take 400 mg ibuprofen every 8 hours for three days or tylenol 650 mg every six hours then as needed.  Apply ice daily for first 3 days.  Always review your discharge instruction sheet given to you by the facility where your surgery was performed. IF YOU HAVE DISABILITY OR FAMILY LEAVE FORMS, YOU MUST BRING THEM TO THE OFFICE FOR PROCESSING.   DO NOT GIVE THEM TO YOUR DOCTOR. A prescription for pain medication may be given to you upon discharge.  Take your pain medication as prescribed, if needed.  If narcotic pain medicine is not needed, then you may take acetaminophen (Tylenol), naprosyn (Alleve) or ibuprofen (Advil) as needed. 1. Take your usually prescribed medications unless otherwise directed. 2. If you need a refill on your pain medication, please contact your pharmacy.  They will contact our office to request authorization.  Prescriptions will not be filled after 5pm or on week-ends. 3. You should follow a light diet the first few days after arrival home, such as soup and crackers, etc.  Resume your normal diet the day after surgery. 4. Most patients will experience some swelling and bruising on the chest and underarm.  Ice packs will help.  Swelling and bruising can take several days to resolve. Wear the binder day and night until you return to the office.  5. It is common to experience some constipation if taking pain medication after surgery.  Increasing fluid intake and taking a stool softener (such as Colace) will usually help or prevent this problem from occurring.  A mild laxative (Milk of Magnesia or Miralax) should be taken according to package instructions if there are no bowel movements after 48 hours. 6. Unless discharge instructions indicate otherwise, leave your bandage dry and in place until your next appointment in 3-5 days.  You may shower in 48 hours with the drain in place You may have steri-strips (small  skin tapes) in place directly over the incision.  These strips should be left on the skin until they start to come off.   Any sutures will be removed at an office visit 7. DRAINS:  If you have drains in place, it is important to keep a list of the amount of drainage produced each day in your drains.  Before leaving the hospital, you should be instructed on drain care.  Call our office if you have any questions about your drains. I will remove your drains when they put out less than 30 cc or ml for 2 consecutive days. 8. ACTIVITIES:  You may resume regular (light) daily activities beginning the next day--such as daily self-care, walking, climbing stairs--gradually increasing activities as tolerated.  You may have sexual intercourse when it is comfortable.  Refrain from any heavy lifting or straining until approved by your doctor. a. You may drive when you are no longer taking prescription pain medication, you can comfortably wear a seatbelt, and you can safely maneuver your car and apply brakes. b. RETURN TO WORK:  __________________________________________________________ 9. You should see your doctor in the office for a follow-up appointment approximately 3-5 days after your surgery.  Your doctors nurse will typically make your follow-up appointment when she calls you with your pathology report.  Expect your pathology report 3-4business days after surgery. 10. OTHER INSTRUCTIONS: ______________________________________________________________________________________________ ____________________________________________________________________________________________ WHEN TO CALL YOUR DR Christin Moline: 1. Fever over 101.0 2. Nausea and/or vomiting 3. Extreme swelling or bruising 4. Continued bleeding from incision. 5. Increased pain,  redness, or drainage from the incision. The clinic staff is available to answer your questions during regular business hours.  Please dont hesitate to call and ask to speak to  one of the nurses for clinical concerns.  If you have a medical emergency, go to the nearest emergency room or call 911.  A surgeon from [facility] is always on call at the hospital. [address] [place], [place], [place]  [address] ? [address], [place], [place]   [address] [phone] ? [phone] ? FAX [phone] Web site: [email or web address]

## 2018-04-06 NOTE — Progress Notes (Signed)
Patient states that she has lymphedema in left arm and surgery is on right arm.  Notified Dr. Donne Hazel and received verbal order that we can use the left arm for IV for short periods of time.

## 2018-04-06 NOTE — Op Note (Signed)
Preoperative diagnosis: Prior left breast cancer, right axillary adenopathy, s/p primary chemotherapy Postoperative diagnosis: Same as above Procedure: Right axillary lymph node dissection Surgeon: Dr. Serita Grammes Anesthesia: General with pectoral block Estimated blood loss: 40 cc Complications: None Drains: 19 French Blake drain to axilla Specimens: Right axillary contents to pathology, right axillary node prior biopsy Sponge and count was correct x2 at end of operation Disposition to recovery stable  Indications: This is a 47 year old female who has history of left breast cancer treated remotely. She now has right axillary adenopathy with no other disease noted in either breast or distant.  She has undergone chemotherapy and has partial response in the biopsied node. We discussed options and I recommended a complete axillary lymph node dissection. The procedure was discussed as well as the risks.  Procedure: After informed consent was obtained the patient was taken to the operating room. She first received a pectoral block. SCDs were in place. She received antibiotics. She was placed under general anesthesia without complication. She was prepped and draped in the standard sterile surgical fashion. Surgical timeout was then performed.  I made a curvilinear incision below the axillary hairline. I then dissected through the axillary fascia.  I then entered into the axilla. I was then able to identify the pectoralis major. I identified the prior positive node easily and removed this separately. This operation was difficult due to her habitus. I went underneath this and I identified the pectoralis minor. I swept the lymph node and axillary tissue off of the chest wall. I did see the long thoracic nerve as well as the thoracodorsal bundle. I identified the axillary vein and then I swept the tissue caudad from the vein on the chest wall to the latissimus laterally. The floor was  dissected free so the muscle was visible. I then removed all of this tissue and passed off the table as a specimen. I did clip many small lymphatics and several small draining veins. I then obtained hemostasis. I did place some Arista in the cavity. I placed a 76 [place] Blake drain secured this with a 2-0 nylon suture. I then closed the axillary fascia with 2-0 Vicryl. The dermis was closed with 3-0 Vicryl and the skin with 4-0 Monocryl. Steri-Strips and glue were then placed. She tolerated this well was transferred to recovery room in stable condition

## 2018-04-06 NOTE — Interval H&P Note (Signed)
History and Physical Interval Note:  04/06/2018 8:54 AM  Jessica Simpson  has presented today for surgery, with the diagnosis of BREAST CANCER  The various methods of treatment have been discussed with the patient and family. After consideration of risks, benefits and other options for treatment, the patient has consented to  Procedure(s): RIGHT AXILLARY LYMPH NODE DISSECTION (Right) as a surgical intervention .  The patient's history has been reviewed, patient examined, no change in status, stable for surgery.  I have reviewed the patient's chart and labs.  Questions were answered to the patient's satisfaction.     Rolm Bookbinder

## 2018-04-07 ENCOUNTER — Encounter (HOSPITAL_COMMUNITY): Payer: Self-pay | Admitting: General Surgery

## 2018-04-07 DIAGNOSIS — C773 Secondary and unspecified malignant neoplasm of axilla and upper limb lymph nodes: Secondary | ICD-10-CM | POA: Diagnosis not present

## 2018-04-07 MED ORDER — METHOCARBAMOL 500 MG PO TABS: 500.0000 mg | ORAL_TABLET | Freq: Three times a day (TID) | ORAL | 0 refills | Status: DC | PRN

## 2018-04-07 MED ORDER — OXYCODONE HCL 5 MG PO TABS: 5.0000 mg | ORAL_TABLET | Freq: Four times a day (QID) | ORAL | 0 refills | Status: DC | PRN

## 2018-04-07 MED ORDER — PROMETHAZINE HCL 12.5 MG PO TABS: 12.5000 mg | ORAL_TABLET | Freq: Four times a day (QID) | ORAL | 0 refills | Status: DC | PRN

## 2018-04-07 NOTE — Plan of Care (Signed)
  Problem: Education: Goal: Knowledge of General Education information will improve Outcome: Progressing   Problem: Clinical Measurements: Goal: Ability to maintain clinical measurements within normal limits will improve Outcome: Progressing Goal: Will remain free from infection Outcome: Progressing Goal: Cardiovascular complication will be avoided Outcome: Progressing   Problem: Activity: Goal: Risk for activity intolerance will decrease Outcome: Progressing   Problem: Pain Managment: Goal: General experience of comfort will improve Outcome: Progressing

## 2018-04-07 NOTE — Discharge Summary (Signed)
Physician Discharge Summary  Patient ID: Jessica Simpson MRN: [id number] DOB/AGE: [birth year] 47 y.o.  Admit date: 04/06/2018 Discharge date: 04/07/2018  Admission Diagnoses: Right axillary lymphadenopathy with metastatic breast cancer  Discharge Diagnoses:  Active Problems:   Breast cancer, right [facility])   Discharged Condition: good  Hospital Course: 47 yof after primary chemotherapy admitted for right alnd. Tolerated well, drain doing fine in am with expected output.  She is tolerating diet with good pain control, will dc home  Consults: None  Significant Diagnostic Studies: none  Treatments: surgery: right alnd  Discharge Exam: Blood pressure (!) 134/92, pulse (!) 56, temperature 98.4 F (36.9 C), temperature source Oral, resp. rate 18, height 5\' 5"  (1.651 m), weight 103.9 kg (229 lb 1.6 oz), SpO2 100 %. drain as expected, serosang, incision clean without hematoma  Disposition: Discharge disposition: 01-Home or Self Care        Allergies as of 04/07/2018      Reactions   Codeine Nausea And Vomiting   Lortab [hydrocodone-acetaminophen] Nausea And Vomiting      Medication List    TAKE these medications   acetaminophen 500 MG tablet Commonly known as:  TYLENOL Take 500 mg by mouth every 6 (six) hours as needed. For pain   amLODipine 5 MG tablet Commonly known as:  NORVASC Take 5 mg by mouth daily.   Cholecalciferol 5000 units capsule Take 1 capsule (5,000 Units total) by mouth daily.   lidocaine-prilocaine cream Commonly known as:  EMLA Apply to affected area once   loratadine 10 MG tablet Commonly known as:  CLARITIN Take 10 mg by mouth daily as needed for allergies.   LORazepam 0.5 MG tablet Commonly known as:  ATIVAN Take 1 tablet (0.5 mg total) by mouth at bedtime as needed for sleep.   methocarbamol 500 MG tablet Commonly known as:  ROBAXIN Take 1 tablet (500 mg total) by mouth every 8 (eight) hours as needed for muscle spasms.    multivitamin tablet Take 1 tablet by mouth daily.   ondansetron 8 MG tablet Commonly known as:  ZOFRAN Take 1 tablet (8 mg total) by mouth 2 (two) times daily as needed for refractory nausea / vomiting. Start on day 3 after chemotherapy.   oxyCODONE 5 MG immediate release tablet Commonly known as:  Oxy IR/ROXICODONE Take 1 tablet (5 mg total) by mouth every 6 (six) hours as needed for moderate pain.   prochlorperazine 10 MG tablet Commonly known as:  COMPAZINE Take 1 tablet (10 mg total) by mouth every 6 (six) hours as needed (Nausea or vomiting).   promethazine 12.5 MG tablet Commonly known as:  PHENERGAN Take 1 tablet (12.5 mg total) by mouth every 6 (six) hours as needed for nausea or vomiting.   traMADol 50 MG tablet Commonly known as:  ULTRAM Take 2 tablets (100 mg total) by mouth every 6 (six) hours as needed.   vitamin B-12 500 MCG tablet Commonly known as:  CYANOCOBALAMIN Take 500 mcg by mouth daily.      Follow-up Information    Rolm Bookbinder, MD In 2 weeks.   Specialty:  General Surgery Contact information: [address] [place] [address] [phone]           Signed: Rolm Bookbinder 04/07/2018, 7:55 AM

## 2018-04-07 NOTE — Progress Notes (Signed)
error 

## 2018-04-09 ENCOUNTER — Emergency Department (HOSPITAL_COMMUNITY)
Admission: EM | Admit: 2018-04-09 | Discharge: 2018-04-09 | Disposition: A | Discharge status: Home / Self Care | Payer: BLUE CROSS/BLUE SHIELD | Attending: Emergency Medicine | Admitting: Emergency Medicine

## 2018-04-09 ENCOUNTER — Encounter (HOSPITAL_COMMUNITY): Payer: Self-pay | Admitting: Emergency Medicine

## 2018-04-09 DIAGNOSIS — Z853 Personal history of malignant neoplasm of breast: Secondary | ICD-10-CM | POA: Insufficient documentation

## 2018-04-09 DIAGNOSIS — Z79899 Other long term (current) drug therapy: Secondary | ICD-10-CM | POA: Diagnosis not present

## 2018-04-09 DIAGNOSIS — Z4801 Encounter for change or removal of surgical wound dressing: Secondary | ICD-10-CM | POA: Diagnosis present

## 2018-04-09 DIAGNOSIS — Z9221 Personal history of antineoplastic chemotherapy: Secondary | ICD-10-CM | POA: Diagnosis not present

## 2018-04-09 DIAGNOSIS — Z923 Personal history of irradiation: Secondary | ICD-10-CM | POA: Insufficient documentation

## 2018-04-09 DIAGNOSIS — Z5189 Encounter for other specified aftercare: Secondary | ICD-10-CM | POA: Insufficient documentation

## 2018-04-09 LAB — CBC WITH DIFFERENTIAL/PLATELET
ABS IMMATURE GRANULOCYTES: 0 10*3/uL (ref 0.0–0.1)
BASOS ABS: 0.1 10*3/uL (ref 0.0–0.1)
Basophils Relative: 1 %
EOS PCT: 3 %
Eosinophils Absolute: 0.2 10*3/uL (ref 0.0–0.7)
HCT: 35.9 % — ABNORMAL LOW (ref 36.0–46.0)
HEMOGLOBIN: 10.8 g/dL — AB (ref 12.0–15.0)
Immature Granulocytes: 0 %
LYMPHS PCT: 31 %
Lymphs Abs: 1.9 10*3/uL (ref 0.7–4.0)
MCH: 27.2 pg (ref 26.0–34.0)
MCHC: 30.1 g/dL (ref 30.0–36.0)
MCV: 90.4 fL (ref 78.0–100.0)
Monocytes Absolute: 0.6 10*3/uL (ref 0.1–1.0)
Monocytes Relative: 10 %
NEUTROS PCT: 55 %
Neutro Abs: 3.4 10*3/uL (ref 1.7–7.7)
Platelets: 307 10*3/uL (ref 150–400)
RBC: 3.97 MIL/uL (ref 3.87–5.11)
RDW: 14.8 % (ref 11.5–15.5)
WBC: 6.2 10*3/uL (ref 4.0–10.5)

## 2018-04-09 LAB — COMPREHENSIVE METABOLIC PANEL
ALT: 20 U/L (ref 14–54)
ANION GAP: 9 (ref 5–15)
AST: 22 U/L (ref 15–41)
Albumin: 3.6 g/dL (ref 3.5–5.0)
Alkaline Phosphatase: 79 U/L (ref 38–126)
BILIRUBIN TOTAL: 0.4 mg/dL (ref 0.3–1.2)
BUN: 15 mg/dL (ref 6–20)
CO2: 28 mmol/L (ref 22–32)
Calcium: 9.6 mg/dL (ref 8.9–10.3)
Chloride: 102 mmol/L (ref 101–111)
Creatinine, Ser: 0.97 mg/dL (ref 0.44–1.00)
GFR calc non Af Amer: >60 mL/min (ref >60)
Glucose, Bld: 149 mg/dL — ABNORMAL HIGH (ref 65–99)
POTASSIUM: 3.3 mmol/L — AB (ref 3.5–5.1)
Sodium: 139 mmol/L (ref 135–145)
TOTAL PROTEIN: 7.1 g/dL (ref 6.5–8.1)

## 2018-04-09 LAB — I-STAT BETA HCG BLOOD, ED (MC, WL, AP ONLY)

## 2018-04-09 LAB — I-STAT CG4 LACTIC ACID, ED: Lactic Acid, Venous: 1.14 mmol/L (ref 0.5–1.9)

## 2018-04-09 NOTE — ED Provider Notes (Signed)
[facility] EMERGENCY DEPARTMENT Provider Note   CSN: [id number] Arrival date & time: 04/09/18  1556     History   Chief Complaint Chief Complaint  Patient presents with  . Wound Check    HPI Jessica Simpson is a 47 y.o. female with history of breast cancer with recent lymph node excision 4 days ago who presents from drainage from her train on her right breast.  She has had some tenderness around the wound, however no more than since the surgery.  She has been taking Motrin.  She comes today because she has noticed wetness on her clothes coming from the drain.  Drain is still draining into the reservoir and has been thinning out as she was told it is supposed to.  She denies any fevers, other chest pain, shortness of breath, abdominal pain, nausea, vomiting.  HPI  Past Medical History:  Diagnosis Date  . BRCA1 negative 10/22/2011  . BRCA2 negative 10/22/2011  . Breast cancer, IDC, Left, Stage III, Triple negative 06/30/2011  . Contraceptive education 01/15/2016  . Family history of breast cancer   . Fracture    right 4th toe  . History of breast cancer 07/22/2014  . Hypertension   . Lymphedema 06/15/2012  . Lymphedema of arm   . Personal history of chemotherapy   . Personal history of radiation therapy   . Positive fecal occult blood test 07/22/2014  . S/P radiation therapy 2013   50 gray in 25 fractions to the left breast, supraclavicular, and axillary regions. She then received a boost to the left lumpectomy of 10 gray in 5 fractions. This was given at [facility].  . Status post chemotherapy Comp. 11/23/11   FEC and Taxotere    Patient Active Problem List   Diagnosis Date Noted  . Breast cancer, right ([place]) 04/06/2018  . Genetic testing 01/12/2018  . Family history of breast cancer   . Port-A-Cath in place 11/21/2017  . Mass of axillary tail of right breast 08/24/2017  . Pain with urination 08/24/2017  . Urinary  frequency 08/24/2017  . Hematuria 08/24/2017  . Encounter for well woman exam with routine gynecological exam 01/15/2016  . Benign cyst of right breast 11/29/2015  . Positive fecal occult blood test 07/22/2014  . History of breast cancer 07/22/2014  . Rectal bleeding 07/10/2014  . Lymphedema 06/15/2012  . BRCA1 negative 10/22/2011  . BRCA2 negative 10/22/2011  . Breast cancer, IDC, Left, Stage III, Triple negative 06/30/2011    Class: Diagnosis of    Past Surgical History:  Procedure Laterality Date  . 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 AXILLARY LYMPH NODE BIOPSY Right 04/06/2018  . AXILLARY LYMPH NODE DISSECTION Right 04/06/2018   Procedure: RIGHT AXILLARY LYMPH NODE DISSECTION;  Surgeon: Rolm Bookbinder, MD;  Location: [facility];  Service: General;  Laterality: Right;  . BREAST LUMPECTOMY    . BREAST SURGERY    . CESAREAN SECTION    . COLONOSCOPY N/A 08/14/2014   Procedure: COLONOSCOPY;  Surgeon: Rogene Houston, MD;  [facility];  Service: Endoscopy;  Laterality: N/A;  930  . EVACUATION BREAST HEMATOMA  12/21/2011   Procedure: EVACUATION HEMATOMA BREAST;  Surgeon: Stark Klein, MD;  Location: [facility];  Service: General;  Laterality: Left;  . HAND SURGERY     tumor on finger, left hand  . INCISION AND DRAINAGE ABSCESS ANAL     x3  .  left breast biopsy with axillary biopsy     core bx  . PORT-A-CATH REMOVAL  12/21/2011   Procedure: REMOVAL PORT-A-CATH;  Surgeon: Haywood Lasso, MD;  Location: [facility];  Service: General;  Laterality: N/A;  . PORTACATH PLACEMENT  08/02/2011   dr Margot Chimes  . PORTACATH PLACEMENT Right 11/14/2017   Procedure: INSERTION PORT-A-CATH WITH [place];  Surgeon: Rolm Bookbinder, MD;  Location: [facility];  Service: General;  Laterality: Right;  . removal breast mass  2004   benign  . TREATMENT FISTULA ANAL    . TUMOR REMOVAL     on left hand  . TUMOR REMOVAL     rt. eye     OB History    Gravida    1   Para  1   Term  1   Preterm      AB      Living  1     SAB      TAB      Ectopic      Multiple      Live Births  1            Home Medications    Prior to Admission medications   Medication Sig Start Date End Date Taking? Authorizing Provider  acetaminophen (TYLENOL) 500 MG tablet Take 500 mg by mouth every 6 (six) hours as needed. For pain    Yes [provider]  amLODipine (NORVASC) 5 MG tablet Take 5 mg by mouth daily. 01/05/16  Yes [provider]  Cholecalciferol 5000 units capsule Take 1 capsule (5,000 Units total) by mouth daily. 07/29/16  Yes Derrek Monaco A, NP  loratadine (CLARITIN) 10 MG tablet Take 10 mg by mouth daily as needed for allergies.    Yes [provider]  methocarbamol (ROBAXIN) 500 MG tablet Take 1 tablet (500 mg total) by mouth every 8 (eight) hours as needed for muscle spasms. 04/07/18  Yes Rolm Bookbinder, MD  Multiple Vitamin (MULTIVITAMIN) tablet Take 1 tablet by mouth daily.   Yes [provider]  oxyCODONE (OXY IR/ROXICODONE) 5 MG immediate release tablet Take 1 tablet (5 mg total) by mouth every 6 (six) hours as needed for moderate pain. 04/07/18  Yes Rolm Bookbinder, MD  promethazine (PHENERGAN) 12.5 MG tablet Take 1 tablet (12.5 mg total) by mouth every 6 (six) hours as needed for nausea or vomiting. 04/07/18  Yes Rolm Bookbinder, MD  vitamin B-12 (CYANOCOBALAMIN) 500 MCG tablet Take 500 mcg by mouth daily.   Yes [provider]  lidocaine-prilocaine (EMLA) cream Apply to affected area once Patient not taking: Reported on 03/24/2018 11/01/17   Nicholas Lose, MD  LORazepam (ATIVAN) 0.5 MG tablet Take 1 tablet (0.5 mg total) by mouth at bedtime as needed for sleep. Patient not taking: Reported on 03/24/2018 11/01/17   Nicholas Lose, MD  ondansetron (ZOFRAN) 8 MG tablet Take 1 tablet (8 mg total) by mouth 2 (two) times daily as needed for refractory nausea / vomiting. Start on day 3 after  chemotherapy. Patient not taking: Reported on 03/24/2018 11/01/17   Nicholas Lose, MD  prochlorperazine (COMPAZINE) 10 MG tablet Take 1 tablet (10 mg total) by mouth every 6 (six) hours as needed (Nausea or vomiting). Patient not taking: Reported on 03/24/2018 11/01/17   Nicholas Lose, MD  traMADol (ULTRAM) 50 MG tablet Take 2 tablets (100 mg total) by mouth every 6 (six) hours as needed. Patient not taking: Reported on 03/24/2018 11/14/17   Rolm Bookbinder, MD  Family History Family History  Problem Relation Age of Onset  . Cancer Father 63       lung cancer   . Cancer Maternal Grandmother        breast  . Cancer Maternal Grandfather   . Breast cancer Other        MGMs mother  . Colon cancer Neg Hx     Social History Social History   Tobacco Use  . Smoking status: Never Smoker  . Smokeless tobacco: Never Used  Substance Use Topics  . Alcohol use: No  . Drug use: No     Allergies   Codeine and Lortab [hydrocodone-acetaminophen]   Review of Systems Review of Systems  Constitutional: Negative for chills and fever.  HENT: Negative for facial swelling and sore throat.   Respiratory: Negative for shortness of breath.   Cardiovascular: Negative for chest pain.  Gastrointestinal: Negative for abdominal pain, nausea and vomiting.  Genitourinary: Negative for dysuria.  Musculoskeletal: Negative for back pain.  Skin: Positive for wound. Negative for rash.  Neurological: Negative for headaches.  Psychiatric/Behavioral: The patient is not nervous/anxious.      Physical Exam Updated Vital Signs BP (!) 146/87   Pulse 83   Temp 98 F (36.7 C) (Oral)   Resp 18   LMP 12/10/2017   SpO2 99%   Physical Exam  Constitutional: She appears well-developed and well-nourished. No distress.  HENT:  Head: Normocephalic and atraumatic.  Mouth/Throat: Oropharynx is clear and moist. No oropharyngeal exudate.  Eyes: Pupils are equal, round, and reactive to light. Conjunctivae are  normal. Right eye exhibits no discharge. Left eye exhibits no discharge. No scleral icterus.  Neck: Normal range of motion. Neck supple. No thyromegaly present.  Cardiovascular: Normal rate, regular rhythm, normal heart sounds and intact distal pulses. Exam reveals no gallop and no friction rub.  No murmur heard. Pulmonary/Chest: Effort normal and breath sounds normal. No stridor. No respiratory distress. She has no wheezes. She has no rales.  Abdominal: Soft. Bowel sounds are normal. She exhibits no distension. There is no tenderness. There is no rebound and no guarding.  Musculoskeletal: She exhibits no edema.  Lymphadenopathy:    She has no cervical adenopathy.  Neurological: She is alert. Coordination normal.  Skin: Skin is warm and dry. No rash noted. She is not diaphoretic. No pallor.  Drain in place to R lateral breast, no erythema around the site of the drain, mild tenderness, dark pink, clear drainage in the reservoir Wound above with Steri-Strips in place, mild erythema to the left edge, mild tenderness  Psychiatric: She has a normal mood and affect.  Nursing note and vitals reviewed.      ED Treatments / Results  Labs (all labs ordered are listed, but only abnormal results are displayed) Labs Reviewed  COMPREHENSIVE METABOLIC PANEL - Abnormal; Notable for the following components:      Result Value   Potassium 3.3 (*)    Glucose, Bld 149 (*)    All other components within normal limits  CBC WITH DIFFERENTIAL/PLATELET - Abnormal; Notable for the following components:   Hemoglobin 10.8 (*)    HCT 35.9 (*)    All other components within normal limits  I-STAT CG4 LACTIC ACID, ED  I-STAT BETA HCG BLOOD, ED ([facility], [facility], [facility] ONLY)    EKG None  Radiology No results found.  Procedures Procedures (including critical care time)  Medications Ordered in ED Medications - No data to display   Initial Impression / Assessment  and Plan / ED Course  I have reviewed the triage  vital signs and the nursing notes.  Pertinent labs & imaging results that were available during my care of the patient were reviewed by me and considered in my medical decision making (see chart for details).     Patient presenting for wound check and leaking from her drain following lymph node excision 4 days ago.  No signs of infection.  Labs are unremarkable.  Patient is afebrile.  She denies any significant pain.  To make them aware of her visit today.  Dressing changed by me using sterile technique.  Replaced Biopatch and covered with Tegaderm.  Patient was advised to call her surgeon tomorrow.  Return precautions discussed. Patient understands and agrees with plan.  Patient vitals stable throughout ED course and discharged in satisfactory condition. I discussed patient case with Dr. Rex Kras who guided the patient's management and agrees with plan.   Final Clinical Impressions(s) / ED Diagnoses   Final diagnoses:  Visit for wound check  Change or removal of surgical wound dressing    ED Discharge Orders    None       Frederica Kuster, Hershal Coria 04/09/18 2124    Little, Wenda Overland, MD 04/10/18 1132

## 2018-04-09 NOTE — ED Triage Notes (Signed)
Pt states she had a lymph node removed Thursday by MD Donne Hazel. Pt arrives with c.o. Leaking from drain site. Pt has a JP drain, dressing to drain has loosened. Surgical site above the drain also has a small portion with redness surrounding the steri strips with warmth, but not to entire suture sight.

## 2018-04-09 NOTE — Discharge Instructions (Signed)
Continue management as you have at home.  Please call your surgeon tomorrow to let them know you are here in case they would like to follow-up with you sooner.  Please return to the emergency department if you develop any new or worsening symptoms including increasing pain, redness, swelling, yellow or thick drainage, or fever.

## 2018-04-21 ENCOUNTER — Ambulatory Visit
Admission: RE | Admit: 2018-04-21 | Discharge: 2018-04-21 | Disposition: A | Discharge status: Home / Self Care | Payer: BLUE CROSS/BLUE SHIELD | Source: Ambulatory Visit | Attending: Radiation Oncology | Admitting: Radiation Oncology

## 2018-04-21 ENCOUNTER — Encounter: Payer: Self-pay | Admitting: Radiation Oncology

## 2018-04-21 ENCOUNTER — Ambulatory Visit: Payer: BLUE CROSS/BLUE SHIELD

## 2018-04-21 NOTE — Progress Notes (Signed)
Location of Breast Cancer: Right Axillary  Histology per Pathology Report:  09/26/17 Diagnosis 1. Breast, left, needle core biopsy, stereotactic, upper outer quadrant, within the lumpectomy bed - FIBROSIS, FAT NECROSIS AND CALCIFICATIONS. - NO MALIGNANCY IDENTIFIED. 2. Lymph node, needle/core biopsy, right axillary - METASTATIC HIGH GRADE CARCINOMA.  Receptor Status: ER(NEG), PR (NEG), Her2-neu (NEG), Ki-(90%)  Did patient present with symptoms or was this found on screening mammography?: She reports that she found the lump and brought it to the attention of her physcian.  Annual diagnostic mammography in November, 2018 demonstrated a palpable enlarged RIGHT axillary lymph node which was biopsied, pathology demonstrating metastatic triple negative high-grade carcinoma similar to her previous LEFT breast cancer. No evidence of malignancy in the RIGHT breast on mammography or prior MRI.   Past/Anticipated interventions by surgeon, if any: 11/14/17 Procedure: right ij US guided powerport insertion Surgeon: Dr Serita Grammes  04/06/18 Procedure: Right axillary lymph node dissection Surgeon: Dr. Serita Grammes   Past/Anticipated interventions by medical oncology, if any: 03/15/18 Wilber Bihari NP, medical oncology.  ASSESSMENT & PLAN:  Breast cancer, IDC, Left, Stage III, Triple negative 09/26/2017: Left breast upper outer quadrant within the lumpectomy bed: Fibrosis no malignancy, right axillary lymph node biopsy metastatic high-grade carcinoma ER 0%, PR 0%, Ki-67 90%, HER-2 negative ratio 1.11 (similar to previous ductal carcinoma) Treatment summary:Carboplatin and gemcitabine days 1 and 8 every 3 weeks x6 cycles adjuvant therapy. Breast MRI 03/08/2018: Interval decrease in the right axillary lymph node to 1.2 x 1.5 cm was previously 1.5 x 1.7 cm Follow-up with Dr. Donne Hazel to discuss surgery with axillary lymph node dissection Tumor board recommendation:  1. Adjuvant radiation  therapy with Xeloda 2. We will need to keep the port in place to see if she needs any additional systemic therapy afterwards.  Most likely with [facility]  Patient will be going to [place] for a week starting 03/20/2018 Discussed her case in the tumor board and with Dr. Donne Hazel extensively. Return to clinic after surgery.   Lymphedema issues, if any:  She does have lymphedema problems to her left breast. She denies lymphedema to her Right arm.  Pain issues, if any:  She reports nerve pain to her Right arm. She began taking gabapentin this past weekend with improvement of her pain. She is taking motrin as needed.   SAFETY ISSUES:  Prior radiation? Yes, She completed a total dose of 60 Gray in 30 fractions on 02/29/2012. She receive 50 gray in 25 fractions to the left breast, supraclavicular, and axillary regions. She then received a boost to the left lumpectomy of 10 gray in 5 fractions. This was given at [facility]  Pacemaker/ICD? No  Possible current pregnancy? No, she has not had a period since February.   Is the patient on methotrexate? 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

## 2018-04-25 ENCOUNTER — Encounter: Payer: Self-pay | Admitting: Radiation Oncology

## 2018-04-25 ENCOUNTER — Ambulatory Visit
Admission: RE | Admit: 2018-04-25 | Discharge: 2018-04-25 | Disposition: A | Discharge status: Home / Self Care | Payer: BLUE CROSS/BLUE SHIELD | Source: Ambulatory Visit | Attending: Radiation Oncology | Admitting: Radiation Oncology

## 2018-04-25 ENCOUNTER — Other Ambulatory Visit: Payer: Self-pay

## 2018-04-25 VITALS — BP 176/101 | HR 76 | Temp 97.8°F | Resp 18 | Ht 65.0 in | Wt 224.4 lb

## 2018-04-25 VITALS — BP 176/101 | HR 67 | Temp 97.8°F | Resp 18 | Wt 224.4 lb

## 2018-04-25 DIAGNOSIS — Z853 Personal history of malignant neoplasm of breast: Secondary | ICD-10-CM | POA: Insufficient documentation

## 2018-04-25 DIAGNOSIS — Z79899 Other long term (current) drug therapy: Secondary | ICD-10-CM | POA: Insufficient documentation

## 2018-04-25 DIAGNOSIS — Z171 Estrogen receptor negative status [ER-]: Secondary | ICD-10-CM

## 2018-04-25 DIAGNOSIS — Z923 Personal history of irradiation: Secondary | ICD-10-CM | POA: Diagnosis not present

## 2018-04-25 DIAGNOSIS — Z8249 Family history of ischemic heart disease and other diseases of the circulatory system: Secondary | ICD-10-CM | POA: Insufficient documentation

## 2018-04-25 DIAGNOSIS — Z9221 Personal history of antineoplastic chemotherapy: Secondary | ICD-10-CM | POA: Diagnosis not present

## 2018-04-25 DIAGNOSIS — C50611 Malignant neoplasm of axillary tail of right female breast: Secondary | ICD-10-CM | POA: Insufficient documentation

## 2018-04-25 DIAGNOSIS — Z510 Encounter for antineoplastic radiation therapy: Secondary | ICD-10-CM | POA: Insufficient documentation

## 2018-04-25 DIAGNOSIS — Z885 Allergy status to narcotic agent status: Secondary | ICD-10-CM | POA: Insufficient documentation

## 2018-04-25 DIAGNOSIS — N6331 Unspecified lump in axillary tail of the right breast: Secondary | ICD-10-CM

## 2018-04-25 LAB — PREGNANCY, URINE: Preg Test, Ur: NEGATIVE

## 2018-04-25 NOTE — Progress Notes (Addendum)
Radiation Oncology         [phone] ________________________________  Outpatient Consultation  Name: Jessica Simpson MRN: [id number]  Date: 04/25/2018  DOB: 10-18-1971  NT:[id number], [name]  Nicholas Lose, MD   REFERRING PHYSICIAN: Nicholas Lose, MD  DIAGNOSIS:    ICD-10-CM   1. History of breast cancer Z85.3 Pregnancy, urine  2. 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 KI-67 of 99%    - Pathologic: No stage assigned - Unsigned Prognostic indicators: Triple negative. KI-67 of 99%     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 ([place]) Staging form: Breast, AJCC 8th Edition - Clinical: Stage IIB (cT0, cN1, cM0, G3, ER-, PR-, HER2-) - Signed by Eppie Gibson, MD on 04/25/2018 - Pathologic: No Stage Recommended (ypT0, pN1, cM0, G3, ER-, PR-, HER2-) - Signed by Eppie Gibson, MD on 04/25/2018   CHIEF COMPLAINT: Here to discuss management of right breast cancer  HISTORY OF PRESENT ILLNESS::Jessica Simpson is a 47 y.o. female who has a history of left breast cancer in 2012-2013 found a lump and brought it to the attention of her provider. I was her radiation oncologist in [place]  She had a diagnostic bilateral breast mammogram with TOMO and Ultra sound of the right breast on the date of 09/23/2017, breast density category C, showing morphologically abnormal lymph node in the right axillary tail, corresponding to the palpable area of concern. Ultra-sound guided biopsy recommended. New grouped pleomorphic calcifications at the lumpectomy site within the upper-outer quadrant  of the left breast, indeterminate, spanning 6 mm. Stereotactic biopsy recommended.    Biopsy on date of 09/26/2017 showed fibrosis, fat necrosis and calcifications and no malignancy in the left breast. It also showed Metastatic High Grade Carcinoma in the right axillary. ER status: (-); PR status (-), Her2 status (-), Proliferation Marker Ki67: 90%.  On 10/12/2017 the patient underwent a PET showing, Hypermetabolic 1.3 cm right axillary lymph node, maximum SUV 7.0, concerning for recurrent malignancy. Postoperative findings in the left lateral breast from prior lumpectomy. Bilateral accentuated tonsillar activity, relatively symmetric, accordingly most likely to be physiologic rather than neoplastic. Faintly accentuated metabolic activity in a normal-sized left station 2 lymph node, maximum SUV 4.3. Accentuated activity projecting over the right ovary, although conceivably this might be activity in the immediately adjacent right ureter. Pelvic sonography recommended for definitive characterization of the right ovary. If there is a significantly abnormal right ovarian lesion sonography, the possibility of a hypermetabolic small ovarian mass must be considered. Aortic Atherosclerosis (ICD10-I70.0).  Mild cardiomegaly.  Ch[place]ell had an MRI of her bilateral breasts with and without contrast on 11/02/2017, breast density category C, showing biopsy-proven metastatic lymph node within the right axilla measuring 1.7 cm. No evidence of malignancy within either breast. Postsurgical changes within the left breast.  The patient met with Nicholas Lose, MD  for a consultation on 11/01/2017. It was decided she would undergo Neoadjuvant chemotherapy with carboplatin and gemcitabine, surgery with axillary lymph node dissection and a referral for radiation therapy. She proceeded to have a port placed on 11/14/2017 by Dr. Donne Hazel.  Genetic testing was done on 12/19/2017 and was reported out on January 10, 2018 through the common  hereditary cancer panel found no deleterious mutations. The Hereditary Gene Panel offered by [facility] includes sequencing and/or deletion duplication testing of the following 47 genes: APC, ATM, AXIN2, BARD1, BMPR1A, BRCA1, BRCA2, BRIP1, CDH1, CDK4, CDKN2A (p14ARF), CDKN2A (p16INK4a), CHEK2, CTNNA1, DICER1, EPCAM (Deletion/duplication testing only), GREM1 (promoter region deletion/duplication testing only), KIT, MEN1, MLH1, MSH2, MSH3, MSH6, MUTYH, NBN, NF1, NHTL1, PALB2, PDGFRA, PMS2, POLD1, POLE, PTEN, RAD50, RAD51C, RAD51D, SDHB, SDHC, SDHD, SMAD4, SMARCA4. STK11, TP53, TSC1, TSC2, and VHL.  The following genes were evaluated for sequence changes only: SDHA and HOXB13 c.251G>A variant only.    Diagnostic unilateral right breast mammogram with TOMO and right axilla U/S on 01/25/2018, breast density category C, showing interval reduction in size of the biopsy-proven metastatic right axillary lymph node since November 2018, indication treatment response. The node has current measurements of 1.9 x 0.8 x 1.4 cm with a maximum cortical thickness of 4 mm (previously 2.0 x 1.1 x 1.8 cm with a maximal cortical thickness of 6 mm). No mammographic evidence of malignancy involving the right breast.  MRI Bilateral breast with and without contrast on 03/08/2018, breast density category C, showing interval decrease in size of the biopsy-proven metastatic right axillary lymph node since the pretreatment MRI 11/02/2017, indicating response to treatment. No MRI evidence of malignancy involving either breast.   She completed 6 cycles of carboplatin and gemcitabine.   On 04/06/2018 the patient underwent Right Axillary Lymph node Dissection showing metastatic high grade carcinoma to lymph node, consistent with patient's clinical history of primary breast ductal carcinoma in the prior positive right axilla. The ten other lymph nodes tested resulted negative for carcinoma. ER status: (-); PR status (-), Her2 status (-), Proliferation  Marker Ki67: 40%.   She will receive Xeloda soon.  PREVIOUS RADIATION THERAPY: Yes, She completed a total dose of 60 Gray in 30 fractions on 02/29/2012. She received 50 Gray in 25 fractions to the left breast, supraclavicular and axillary regions. She then received a boost to the left lumpectomy of 10 Gray in 5 fractions. This was [facility].   06/30/2011:Breast cancer, IDC, Left, Stage III, Triple negative, 7.6 cm breast mass and palpable axillary mass November 2012 to January 2013: Neoadjuvant chemotherapy with dose denseFECfollowed by dose dense Taxotere 12/21/2011:Left lumpectomy: High-grade poorly differentiated IDC 1.7 cm with high-grade DCIS, margins negative, 3/7 lymph nodes positive, ER 0%, PR 0%, HER-2 negative ratio 1.33 T1CN1 stage IIb   PAST MEDICAL HISTORY:  has a past medical history of BRCA1 negative (10/22/2011), BRCA2 negative (10/22/2011), Breast cancer, IDC, Left, Stage III, Triple negative (06/30/2011), Contraceptive education (01/15/2016), Family history of breast cancer, Fracture, History of breast cancer (07/22/2014), Hypertension, Lymphedema (06/15/2012), Lymphedema of arm, Personal history of chemotherapy, Personal history of radiation therapy, Positive fecal occult blood test (07/22/2014), S/P radiation therapy (2013), and Status post chemotherapy (Comp. 11/23/11).    PAST SURGICAL HISTORY: Past Surgical History:  Procedure Laterality Date  . 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 AXILLARY LYMPH NODE BIOPSY Right 04/06/2018  . AXILLARY LYMPH NODE DISSECTION Right 04/06/2018   Procedure: RIGHT AXILLARY LYMPH NODE DISSECTION;  Surgeon: Rolm Bookbinder, MD;  Location: [place];  Service: General;  Laterality: Right;  . BREAST LUMPECTOMY    . BREAST SURGERY    . CESAREAN SECTION    . COLONOSCOPY N/A 08/14/2014  Procedure: COLONOSCOPY;  Surgeon: Rogene Houston, MD;  [facility];  Service: Endoscopy;   Laterality: N/A;  930  . EVACUATION BREAST HEMATOMA  12/21/2011   Procedure: EVACUATION HEMATOMA BREAST;  Surgeon: Stark Klein, MD;  Location: [place];  Service: General;  Laterality: Left;  . HAND SURGERY     tumor on finger, left hand  . INCISION AND DRAINAGE ABSCESS ANAL     x3  . left breast biopsy with axillary biopsy     core bx  . PORT-A-CATH REMOVAL  12/21/2011   Procedure: REMOVAL PORT-A-CATH;  Surgeon: Haywood Lasso, MD;  Location: [place];  Service: General;  Laterality: N/A;  . PORTACATH PLACEMENT  08/02/2011   dr Margot Chimes  . PORTACATH PLACEMENT Right 11/14/2017   Procedure: INSERTION PORT-A-CATH WITH [place];  Surgeon: Rolm Bookbinder, MD;  Location: [place];  Service: General;  Laterality: Right;  . removal breast mass  2004   benign  . TREATMENT FISTULA ANAL    . TUMOR REMOVAL     on left hand  . TUMOR REMOVAL     rt. eye    FAMILY HISTORY: family history includes Breast cancer in her other; Cancer in her maternal grandfather and maternal grandmother; Cancer (age of onset: 70) in her father.  SOCIAL HISTORY:  reports that she has never smoked. She has never used smokeless tobacco. She reports that she does not drink alcohol or use drugs.  ALLERGIES: Codeine and Lortab [hydrocodone-acetaminophen]  MEDICATIONS:  Current Outpatient Medications  Medication Sig Dispense Refill  . acetaminophen (TYLENOL) 500 MG tablet Take 500 mg by mouth every 6 (six) hours as needed. For pain     . amLODipine (NORVASC) 5 MG tablet Take 5 mg by mouth daily.  6  . Cholecalciferol 5000 units capsule Take 1 capsule (5,000 Units total) by mouth daily.    Marland Kitchen gabapentin (NEURONTIN) 100 MG capsule Take 100 mg by mouth at bedtime.    Marland Kitchen ibuprofen (ADVIL,MOTRIN) 400 MG tablet Take 400 mg by mouth every 8 (eight) hours as needed.    . Multiple Vitamin (MULTIVITAMIN) tablet Take 1 tablet by mouth daily.    . vitamin B-12 (CYANOCOBALAMIN) 500 MCG tablet Take 500 mcg by mouth daily.      Marland Kitchen lidocaine-prilocaine (EMLA) cream Apply to affected area once (Patient not taking: Reported on 03/24/2018) 30 g 3  . loratadine (CLARITIN) 10 MG tablet Take 10 mg by mouth daily as needed for allergies.     Marland Kitchen LORazepam (ATIVAN) 0.5 MG tablet Take 1 tablet (0.5 mg total) by mouth at bedtime as needed for sleep. (Patient not taking: Reported on 03/24/2018) 30 tablet 0  . methocarbamol (ROBAXIN) 500 MG tablet Take 1 tablet (500 mg total) by mouth every 8 (eight) hours as needed for muscle spasms. (Patient not taking: Reported on 04/25/2018) 20 tablet 0  . ondansetron (ZOFRAN) 8 MG tablet Take 1 tablet (8 mg total) by mouth 2 (two) times daily as needed for refractory nausea / vomiting. Start on day 3 after chemotherapy. (Patient not taking: Reported on 03/24/2018) 30 tablet 1  . oxyCODONE (OXY IR/ROXICODONE) 5 MG immediate release tablet Take 1 tablet (5 mg total) by mouth every 6 (six) hours as needed for moderate pain. (Patient not taking: Reported on 04/25/2018) 15 tablet 0  . prochlorperazine (COMPAZINE) 10 MG tablet Take 1 tablet (10 mg total) by mouth every 6 (six) hours as needed (Nausea or vomiting). (Patient not taking: Reported on 03/24/2018) 30  tablet 1  . promethazine (PHENERGAN) 12.5 MG tablet Take 1 tablet (12.5 mg total) by mouth every 6 (six) hours as needed for nausea or vomiting. (Patient not taking: Reported on 04/25/2018) 10 tablet 0  . traMADol (ULTRAM) 50 MG tablet Take 2 tablets (100 mg total) by mouth every 6 (six) hours as needed. (Patient not taking: Reported on 03/24/2018) 10 tablet 0   No current facility-administered medications for this encounter.    Facility-Administered Medications Ordered in Other Encounters  Medication Dose Route Frequency Provider Last Rate Last Dose  . lactated ringers infusion    Continuous PRN Kerby Less, CRNA        REVIEW OF SYSTEMS:  As above    PHYSICAL EXAM:  height is _0  (1.651 m) and weight is 224 lb 6.4 oz (101.8 kg). Her  temperature is 97.8 F (36.6 C). Her blood pressure is 176/101 (abnormal) and her pulse is 76. Her respiration is 18 and oxygen saturation is 100%.   General: Alert and oriented, in no acute distress HEENT: Head is normocephalic. Extraocular movements are intact. Oropharynx is clear. Neck: Neck is supple, no palpable cervical or supraclavicular lymphadenopathy. Heart: Regular in rate and rhythm with no murmurs, rubs, or gallops. Chest: Clear to auscultation bilaterally, with no rhonchi, wheezes, or rales. Vascular: Right upper chest Port-a-cath Abdomen: Soft, nontender, nondistended, with no rigidity or guarding. Extremities: No cyanosis or edema. Lymphatics: see Neck Exam Skin: No concerning lesions. Musculoskeletal: symmetric strength and muscle tone throughout. Neurologic: Cranial nerves II through XII are grossly intact. No obvious focalities. Speech is fluent. Coordination is intact. Psychiatric: Judgment and insight are intact. Affect is appropriate. Breasts: She has JP drain in the right lateral breast with minimal fluid right now. She has a axillary scar in the right breast that is healing well. Left breast tissue thickening just medial to the left lumpectomy scar. No other palpable masses appreciated in the breasts or axillae.  ECOG = 0  0 - Asymptomatic (Fully active, able to carry on all predisease activities without restriction)  1 - Symptomatic but completely ambulatory (Restricted in physically strenuous activity but ambulatory and able to carry out work of a light or sedentary nature. For example, light housework, office work)  2 - Symptomatic, <50% in bed during the day (Ambulatory and capable of all self care but unable to carry out any work activities. Up and about more than 50% of waking hours)  3 - Symptomatic, >50% in bed, but not bedbound (Capable of only limited self-care, confined to bed or chair 50% or more of waking hours)  4 - Bedbound (Completely disabled.  Cannot carry on any self-care. Totally confined to bed or chair)  5 - Death   Eustace Pen MM, Creech RH, Tormey DC, et al. [phone]). "Toxicity and response criteria of the [facility] Group". [place] Oncol. 5 (6): 649-55   LABORATORY DATA:  Lab Results  Component Value Date   WBC 6.2 04/09/2018   HGB 10.8 (L) 04/09/2018   HCT 35.9 (L) 04/09/2018   MCV 90.4 04/09/2018   PLT 307 04/09/2018   CMP     Component Value Date/Time   NA 139 04/09/2018 1615   NA 140 07/26/2016 1650   K 3.3 (L) 04/09/2018 1615   CL 102 04/09/2018 1615   CO2 28 04/09/2018 1615   GLUCOSE 149 (H) 04/09/2018 1615   BUN 15 04/09/2018 1615   BUN 13 07/26/2016 1650   CREATININE 0.97 04/09/2018 1615  CREATININE 0.85 03/06/2018 1117   CALCIUM 9.6 04/09/2018 1615   PROT 7.1 04/09/2018 1615   PROT 7.4 07/26/2016 1650   ALBUMIN 3.6 04/09/2018 1615   ALBUMIN 4.2 07/26/2016 1650   AST 22 04/09/2018 1615   AST 23 03/06/2018 1117   ALT 20 04/09/2018 1615   ALT 41 03/06/2018 1117   ALKPHOS 79 04/09/2018 1615   BILITOT 0.4 04/09/2018 1615   BILITOT 0.3 03/06/2018 1117   GFRNONAA >60 04/09/2018 1615   GFRNONAA >60 03/06/2018 1117   GFRAA >60 04/09/2018 1615   GFRAA >60 03/06/2018 1117    RADIOGRAPHY: as above    IMPRESSION/PLAN: Right axillary cancer, related to breast; prior left breast cancer  It was a pleasure meeting the patient today. We discussed the risks, benefits, and side effects of radiotherapy. I recommend radiotherapy to the right breast and regional nodes to reduce her risk of locoregional recurrence by 2/3.  We discussed that radiation would take approximately 5.5 weeks to complete and that I would give the patient a few weeks to heal following surgery before starting treatment planning. We spoke about acute effects including skin irritation and fatigue as well as much less common late effects including internal organ injury or irritation. We spoke about the latest technology that is  used to minimize the risk of late effects for patients undergoing radiotherapy to the breast or chest wall. No guarantees of treatment were given. The patient is enthusiastic about proceeding with treatment. I look forward to participating in the patient's care. Consent signed today.  She wishes to have her treatment in [place] even though I offered referrals closer to her home  We will obtain a urine pregnancy test today, 04/25/18. She understands she will use condoms while completing her treatments.   We will obtain her records from [facility] hospital to take in consideration her prior treatment fields.   I will order a U/S to follow-up on the hypermetabolic activity in the ovarian tissue from the staging PET scan. I will ask nursing to find out from radiology what the appropriate order code is for this in the EMR.  We will tentatively plan to simulate her treatment the week of July 15th to give time for her drain to be removed.   I spent 45 minutes  face to face with the patient and more than 50% of that time was spent in counseling and/or coordination of care.   __________________________________________   Eppie Gibson, MD   This document serves as a record of services personally performed by Eppie Gibson, MD. It was created on her behalf by Margit Banda, a trained medical scribe. The creation of this record is based on the scribe's personal observations and the provider's statements to them. This document has been checked and approved by the attending provider.

## 2018-04-26 ENCOUNTER — Other Ambulatory Visit: Payer: Self-pay | Admitting: Radiation Oncology

## 2018-04-26 DIAGNOSIS — Z171 Estrogen receptor negative status [ER-]: Principal | ICD-10-CM

## 2018-04-26 DIAGNOSIS — C50611 Malignant neoplasm of axillary tail of right female breast: Secondary | ICD-10-CM

## 2018-05-01 ENCOUNTER — Telehealth: Payer: Self-pay | Admitting: Hematology and Oncology

## 2018-05-01 NOTE — Telephone Encounter (Signed)
Spoke to patient regarding upcoming august appts per 7/8 sch message.

## 2018-05-08 ENCOUNTER — Telehealth: Payer: Self-pay

## 2018-05-08 ENCOUNTER — Ambulatory Visit
Admission: RE | Admit: 2018-05-08 | Discharge: 2018-05-08 | Disposition: A | Discharge status: Home / Self Care | Payer: BLUE CROSS/BLUE SHIELD | Source: Ambulatory Visit | Attending: Radiation Oncology | Admitting: Radiation Oncology

## 2018-05-08 ENCOUNTER — Other Ambulatory Visit: Payer: Self-pay | Admitting: Hematology and Oncology

## 2018-05-08 DIAGNOSIS — Z171 Estrogen receptor negative status [ER-]: Principal | ICD-10-CM

## 2018-05-08 DIAGNOSIS — C50611 Malignant neoplasm of axillary tail of right female breast: Secondary | ICD-10-CM

## 2018-05-08 MED ORDER — CAPECITABINE 500 MG PO TABS: 1000.0000 mg | ORAL_TABLET | Freq: Two times a day (BID) | ORAL | 0 refills | Status: DC

## 2018-05-08 NOTE — Progress Notes (Signed)
Pt stopped in the lobby to inquire about starting on her xeloda pill with her upcoming radiation treatment. Pt wanted to make sure she receives the medicine prior to radonc start date 05/16/18. Notified Dr.Gudena to place order for pt and will notify Denyse Amass for prior auth.  Pt aware of plan and will follow up by the end of this week if she doesn't hear from our office.

## 2018-05-08 NOTE — Telephone Encounter (Signed)
Per 7/15 patient walk in. Unable to cancel appointment for outside sources. Patient did not have a appointment here in [place].

## 2018-05-08 NOTE — Addendum Note (Signed)
Encounter addended by: Eppie Gibson, MD on: 05/08/2018 7:44 AM  Actions taken: Sign clinical note

## 2018-05-08 NOTE — Progress Notes (Signed)
  Radiation Oncology         [phone] ________________________________  Name: Jessica Simpson MRN: [id number]  Date: 05/08/2018  DOB: 06/17/1971  SIMULATION AND TREATMENT PLANNING NOTE // Special Treatment Procedure Note   Outpatient  DIAGNOSIS:     ICD-10-CM   1. 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  Identity was confirmed.  All relevant records and images related to the planned course of therapy were reviewed.  The patient freely provided informed written consent to proceed with treatment after reviewing the details related to the planned course of therapy. The consent form was witnessed and verified by the simulation staff.    Then, the patient was set-up in a stable reproducible supine position for radiation therapy with her ipsilateral arm over her head, and her upper body secured in a custom-made Vac-lok device.  CT images were obtained.  Surface markings were placed.  The CT images were loaded into the planning software.    TREATMENT PLANNING NOTE: Treatment planning then occurred.  The radiation prescription was entered and confirmed.     A total of 5 medically necessary complex treatment devices were fabricated and supervised by me: 4 fields with MLCs for custom blocks to protect heart, and lungs;  and, a Vac-lok. MORE COMPLEX DEVICES MAY BE MADE IN DOSIMETRY FOR FIELD IN FIELD BEAMS FOR DOSE HOMOGENEITY.  I have requested : 3D Simulation which is medically necessary to give adequate dose to at risk tissues while sparing lungs and heart.  I have requested a DVH of the following structures: lungs, heart, esophagus, cord.    The patient will receive 50.4 Gy in 28 fractions to the right breast and regional nodes with 4 fields.  This will not be followed by a boost.  Optical Surface Tracking Plan:  Since intensity modulated  radiotherapy (IMRT) and 3D conformal radiation treatment methods are predicated on accurate and precise positioning for treatment, intrafraction motion monitoring is medically necessary to ensure accurate and safe treatment delivery. The ability to quantify intrafraction motion without excessive ionizing radiation dose can only be performed with optical surface tracking. Accordingly, surface imaging offers the opportunity to obtain 3D measurements of patient position throughout IMRT and 3D treatments without excessive radiation exposure. I am ordering optical surface tracking for this patient's upcoming course of radiotherapy.  ________________________________   Reference:  Ursula Alert, J, et al. Surface imaging-based analysis of intrafraction motion for breast radiotherapy patients.Journal of [place], n. 6, nov. 2014. ISSN [id number].  Available at: <http://www.jacmp.org/index.php/jacmp/article/view/4957>.   Special Treatment Procedure Note: The patient received prior radiotherapy close to her current fields. There could be some overlap of radiation dose.  Prior regional radiotherapy increases the risk of side effects from treatment. I have considered this in the treatment planning process and have aimed to minimize tissue overlap.  This increases the complexity of this patient's treatment and therefore this constitutes a special treatment procedure.  -----------------------------------  Eppie Gibson, MD

## 2018-05-09 ENCOUNTER — Telehealth: Payer: Self-pay | Admitting: Pharmacist

## 2018-05-09 DIAGNOSIS — C50012 Malignant neoplasm of nipple and areola, left female breast: Secondary | ICD-10-CM

## 2018-05-09 MED ORDER — CAPECITABINE 500 MG PO TABS: 1000.0000 mg | ORAL_TABLET | Freq: Two times a day (BID) | ORAL | 0 refills | Status: DC

## 2018-05-09 NOTE — Telephone Encounter (Addendum)
Oral Oncology Pharmacist Encounter  Received new prescription for Xeloda (capcitabine) for the treatment of recurrent breast cancer in conjunction with radiation, planned duration 28 treatments.  Labs from 04/09/18 assessed, OK for treatment.  Current medication list in Epic reviewed, no significant DDIs with Xeloda identified.  Prescription has been e-scribed to the [facility] for benefits analysis and approval.  Oral Oncology Clinic will continue to follow for insurance authorization, copayment issues, initial counseling and start date.  Johny Drilling, PharmD, BCPS, BCOP  05/09/2018 12:54 PM Oral Oncology Clinic [phone]

## 2018-05-10 ENCOUNTER — Telehealth: Payer: Self-pay | Admitting: Pharmacist

## 2018-05-10 MED ORDER — CAPECITABINE 500 MG PO TABS: 1000.0000 mg | ORAL_TABLET | Freq: Two times a day (BID) | ORAL | 0 refills | Status: DC

## 2018-05-10 NOTE — Addendum Note (Signed)
Addended by: Enis Gash on: 05/10/2018 01:16 PM   Modules accepted: Orders

## 2018-05-10 NOTE — Telephone Encounter (Signed)
Oral Chemotherapy Pharmacist Encounter   I spoke with patient for overview of: Xeloda.   Counseled patient on administration, dosing, side effects, monitoring, drug-food interactions, safe handling, storage, and disposal.  Patient will take Xeloda 500mg  tablets, 2 tablets (1000mg ) by mouth in AM and 2 tabs (1000mg ) by mouth in PM, within 30 minutes of finishing meals, on days of radiation only.  Xeloda and radiation start date: 05/16/2018  Adverse effects of Xeloda include but are not limited to: fatigue, decreased blood counts, GI upset, diarrhea, and hand-foot syndrome.  Patient has anti-emetic on hand and knows to take it if nausea develops.   Patient will obtain anti diarrheal and alert the office of 4 or more loose stools above baseline.   Reviewed with patient importance of keeping a medication schedule and plan for any missed doses.  Jessica Simpson voiced understanding and appreciation.   All questions answered.  Medication reconciliation performed and medication/allergy list updated.  Patient updated that insurance authorization is pending. [facility] per insurance requirement. Prescription has been E [facility].  Patient instructed to reach out to the pharmacy on Friday (05/12/2018) to check status of Xeloda prescription and to obtain copayment information if she has not yet heard from dispensing pharmacy.  [facility] phone number: [phone]  Patient instructed that dispensing pharmacy may have to send her Xeloda in separate fills as radiation course spans over more than a 30 calendar day period.  Patient instructed to reach out to oral oncology clinic if she has any trouble obtaining or affording her medication.  Patient knows to call the office with questions or concerns. Oral Oncology Clinic will continue to follow.  Thank you,  Johny Drilling, PharmD, BCPS, BCOP  05/10/2018   12:55 PM Oral Oncology Clinic [phone]

## 2018-05-10 NOTE — Telephone Encounter (Signed)
Oral Oncology Pharmacist Encounter  Prior insurance authorization for Xeloda (capecitabine) submitted on cover my meds Key: [id number] Clinical questions answered Status is pending Insurance authorization faxed to [employer] through cover my meds to [phone] May take 5 business days for determination  This encounter will continue to be updated until final determination  Johny Drilling, PharmD, BCPS, BCOP  05/10/2018 12:04 PM Oral Oncology Clinic [phone]

## 2018-05-11 NOTE — Telephone Encounter (Signed)
Oral Oncology Patient Advocate Encounter  Prior Authorization for Xeloda has been approved.    PA# [id number] TT Effective dates: 05/11/2018 through 05/12/2019 [facility] #: [phone]  Oral Oncology Clinic will continue to follow.   [place] Patient [place] Phone [phone] Fax [phone]

## 2018-05-12 ENCOUNTER — Ambulatory Visit: Payer: BLUE CROSS/BLUE SHIELD | Admitting: Rehabilitation

## 2018-05-12 DIAGNOSIS — C50611 Malignant neoplasm of axillary tail of right female breast: Secondary | ICD-10-CM | POA: Diagnosis not present

## 2018-05-15 ENCOUNTER — Ambulatory Visit
Admission: RE | Admit: 2018-05-15 | Discharge: 2018-05-15 | Disposition: A | Discharge status: Home / Self Care | Payer: BLUE CROSS/BLUE SHIELD | Source: Ambulatory Visit | Attending: Radiation Oncology | Admitting: Radiation Oncology

## 2018-05-15 ENCOUNTER — Telehealth: Payer: Self-pay | Admitting: Pharmacist

## 2018-05-15 DIAGNOSIS — C50611 Malignant neoplasm of axillary tail of right female breast: Secondary | ICD-10-CM | POA: Diagnosis not present

## 2018-05-15 NOTE — Telephone Encounter (Signed)
Oral Oncology Pharmacist Encounter  [facility]ed [facility] for update on patient Xeloda prescription.  Per representative, Xeloda prescription had not yet been verified with the pharmacist, however, it has been through their insurance investigation phase.  Urgent message was sent to pharmacist so that Xeloda could be processed.  Representative stated that test claim revealed copayment of $0.  Representative stated I could instruct the patient to call the mid afternoon to schedule shipment of their Xeloda, and patient should be instructed to request early morning delivery.  I met patient in [facility] lobby and delivered above information. Phone number to the pharmacy and to oral oncology clinic provided to patient.  Patient will call this afternoon to schedule shipment of her first fill of Xeloda. Patient was happy to hear about copayment of $0. I reviewed with patient that she will only receive first 30 calendar day supply of Xeloda, and will have to be scheduled for a refill of Xeloda to obtain the rest of her supply.  All questions answered. Patient expressed understanding and appreciation. Patient knows to call the office with any additional questions or concerns.  Johny Drilling, PharmD, BCPS, BCOP  05/15/2018 3:34 PM Oral Oncology Clinic [phone]

## 2018-05-16 ENCOUNTER — Ambulatory Visit
Admission: RE | Admit: 2018-05-16 | Discharge: 2018-05-16 | Disposition: A | Discharge status: Home / Self Care | Payer: BLUE CROSS/BLUE SHIELD | Source: Ambulatory Visit | Attending: Radiation Oncology | Admitting: Radiation Oncology

## 2018-05-16 DIAGNOSIS — C50611 Malignant neoplasm of axillary tail of right female breast: Secondary | ICD-10-CM

## 2018-05-16 DIAGNOSIS — Z171 Estrogen receptor negative status [ER-]: Principal | ICD-10-CM

## 2018-05-16 MED ORDER — ALRA NON-METALLIC DEODORANT (RAD-ONC)
1.0000 "application " | Freq: Once | TOPICAL | Status: AC
  Administered 2018-05-16: 1 via TOPICAL

## 2018-05-16 MED ORDER — RADIAPLEXRX EX GEL
Freq: Once | CUTANEOUS | Status: AC
  Administered 2018-05-16: 14:00:00 via TOPICAL

## 2018-05-16 NOTE — Progress Notes (Signed)

## 2018-05-17 ENCOUNTER — Ambulatory Visit
Admission: RE | Admit: 2018-05-17 | Discharge: 2018-05-17 | Disposition: A | Discharge status: Home / Self Care | Payer: BLUE CROSS/BLUE SHIELD | Source: Ambulatory Visit | Attending: Radiation Oncology | Admitting: Radiation Oncology

## 2018-05-17 DIAGNOSIS — C50611 Malignant neoplasm of axillary tail of right female breast: Secondary | ICD-10-CM | POA: Diagnosis not present

## 2018-05-17 NOTE — Telephone Encounter (Signed)
Oral Oncology Patient Advocate Encounter  [facility] to follow up on delivery of Xeloda to the patient.  Xeloda was shipped on the 22nd and delivered on the 23rd.  [place] Patient Jessica Simpson Phone [phone] Fax [phone]

## 2018-05-18 ENCOUNTER — Ambulatory Visit
Admission: RE | Admit: 2018-05-18 | Discharge: 2018-05-18 | Disposition: A | Discharge status: Home / Self Care | Payer: BLUE CROSS/BLUE SHIELD | Source: Ambulatory Visit | Attending: Radiation Oncology | Admitting: Radiation Oncology

## 2018-05-18 DIAGNOSIS — C50611 Malignant neoplasm of axillary tail of right female breast: Secondary | ICD-10-CM | POA: Diagnosis not present

## 2018-05-19 ENCOUNTER — Ambulatory Visit
Admission: RE | Admit: 2018-05-19 | Discharge: 2018-05-19 | Disposition: A | Discharge status: Home / Self Care | Payer: BLUE CROSS/BLUE SHIELD | Source: Ambulatory Visit | Attending: Radiation Oncology | Admitting: Radiation Oncology

## 2018-05-19 DIAGNOSIS — C50611 Malignant neoplasm of axillary tail of right female breast: Secondary | ICD-10-CM | POA: Diagnosis not present

## 2018-05-22 ENCOUNTER — Ambulatory Visit
Admission: RE | Admit: 2018-05-22 | Discharge: 2018-05-22 | Disposition: A | Discharge status: Home / Self Care | Payer: BLUE CROSS/BLUE SHIELD | Source: Ambulatory Visit | Attending: Radiation Oncology | Admitting: Radiation Oncology

## 2018-05-22 DIAGNOSIS — C50611 Malignant neoplasm of axillary tail of right female breast: Secondary | ICD-10-CM | POA: Diagnosis not present

## 2018-05-23 ENCOUNTER — Ambulatory Visit
Admission: RE | Admit: 2018-05-23 | Discharge: 2018-05-23 | Disposition: A | Discharge status: Home / Self Care | Payer: BLUE CROSS/BLUE SHIELD | Source: Ambulatory Visit | Attending: Radiation Oncology | Admitting: Radiation Oncology

## 2018-05-23 DIAGNOSIS — C50611 Malignant neoplasm of axillary tail of right female breast: Secondary | ICD-10-CM | POA: Diagnosis not present

## 2018-05-24 ENCOUNTER — Ambulatory Visit
Admission: RE | Admit: 2018-05-24 | Discharge: 2018-05-24 | Disposition: A | Discharge status: Home / Self Care | Payer: BLUE CROSS/BLUE SHIELD | Source: Ambulatory Visit | Attending: Radiation Oncology | Admitting: Radiation Oncology

## 2018-05-24 DIAGNOSIS — C50611 Malignant neoplasm of axillary tail of right female breast: Secondary | ICD-10-CM | POA: Diagnosis not present

## 2018-05-25 ENCOUNTER — Ambulatory Visit
Admission: RE | Admit: 2018-05-25 | Discharge: 2018-05-25 | Disposition: A | Discharge status: Home / Self Care | Payer: BLUE CROSS/BLUE SHIELD | Source: Ambulatory Visit | Attending: Radiation Oncology | Admitting: Radiation Oncology

## 2018-05-25 DIAGNOSIS — C50911 Malignant neoplasm of unspecified site of right female breast: Secondary | ICD-10-CM | POA: Diagnosis not present

## 2018-05-25 DIAGNOSIS — C50611 Malignant neoplasm of axillary tail of right female breast: Secondary | ICD-10-CM | POA: Diagnosis present

## 2018-05-25 DIAGNOSIS — Z510 Encounter for antineoplastic radiation therapy: Secondary | ICD-10-CM | POA: Insufficient documentation

## 2018-05-26 ENCOUNTER — Ambulatory Visit
Admission: RE | Admit: 2018-05-26 | Discharge: 2018-05-26 | Disposition: A | Discharge status: Home / Self Care | Payer: BLUE CROSS/BLUE SHIELD | Source: Ambulatory Visit | Attending: Radiation Oncology | Admitting: Radiation Oncology

## 2018-05-26 DIAGNOSIS — C50911 Malignant neoplasm of unspecified site of right female breast: Secondary | ICD-10-CM | POA: Diagnosis not present

## 2018-05-29 ENCOUNTER — Ambulatory Visit
Admission: RE | Admit: 2018-05-29 | Discharge: 2018-05-29 | Disposition: A | Discharge status: Home / Self Care | Payer: BLUE CROSS/BLUE SHIELD | Source: Ambulatory Visit | Attending: Radiation Oncology | Admitting: Radiation Oncology

## 2018-05-29 DIAGNOSIS — C50911 Malignant neoplasm of unspecified site of right female breast: Secondary | ICD-10-CM | POA: Diagnosis not present

## 2018-05-30 ENCOUNTER — Ambulatory Visit
Admission: RE | Admit: 2018-05-30 | Discharge: 2018-05-30 | Disposition: A | Discharge status: Home / Self Care | Payer: BLUE CROSS/BLUE SHIELD | Source: Ambulatory Visit | Attending: Radiation Oncology | Admitting: Radiation Oncology

## 2018-05-30 DIAGNOSIS — C50911 Malignant neoplasm of unspecified site of right female breast: Secondary | ICD-10-CM | POA: Diagnosis not present

## 2018-05-31 ENCOUNTER — Ambulatory Visit
Admission: RE | Admit: 2018-05-31 | Discharge: 2018-05-31 | Disposition: A | Discharge status: Home / Self Care | Payer: BLUE CROSS/BLUE SHIELD | Source: Ambulatory Visit | Attending: Radiation Oncology | Admitting: Radiation Oncology

## 2018-05-31 DIAGNOSIS — C50911 Malignant neoplasm of unspecified site of right female breast: Secondary | ICD-10-CM | POA: Diagnosis not present

## 2018-06-01 ENCOUNTER — Ambulatory Visit
Admission: RE | Admit: 2018-06-01 | Discharge: 2018-06-01 | Disposition: A | Discharge status: Home / Self Care | Payer: BLUE CROSS/BLUE SHIELD | Source: Ambulatory Visit | Attending: Radiation Oncology | Admitting: Radiation Oncology

## 2018-06-01 DIAGNOSIS — C50911 Malignant neoplasm of unspecified site of right female breast: Secondary | ICD-10-CM | POA: Diagnosis not present

## 2018-06-02 ENCOUNTER — Ambulatory Visit
Admission: RE | Admit: 2018-06-02 | Discharge: 2018-06-02 | Disposition: A | Discharge status: Home / Self Care | Payer: BLUE CROSS/BLUE SHIELD | Source: Ambulatory Visit | Attending: Radiation Oncology | Admitting: Radiation Oncology

## 2018-06-02 ENCOUNTER — Other Ambulatory Visit: Payer: Self-pay | Admitting: Hematology and Oncology

## 2018-06-02 DIAGNOSIS — C50911 Malignant neoplasm of unspecified site of right female breast: Secondary | ICD-10-CM | POA: Diagnosis not present

## 2018-06-02 DIAGNOSIS — C50012 Malignant neoplasm of nipple and areola, left female breast: Secondary | ICD-10-CM

## 2018-06-05 ENCOUNTER — Ambulatory Visit
Admission: RE | Admit: 2018-06-05 | Discharge: 2018-06-05 | Disposition: A | Discharge status: Home / Self Care | Payer: BLUE CROSS/BLUE SHIELD | Source: Ambulatory Visit | Attending: Radiation Oncology | Admitting: Radiation Oncology

## 2018-06-05 DIAGNOSIS — C50911 Malignant neoplasm of unspecified site of right female breast: Secondary | ICD-10-CM | POA: Diagnosis not present

## 2018-06-06 ENCOUNTER — Ambulatory Visit
Admission: RE | Admit: 2018-06-06 | Discharge: 2018-06-06 | Disposition: A | Discharge status: Home / Self Care | Payer: BLUE CROSS/BLUE SHIELD | Source: Ambulatory Visit | Attending: Radiation Oncology | Admitting: Radiation Oncology

## 2018-06-06 DIAGNOSIS — C50911 Malignant neoplasm of unspecified site of right female breast: Secondary | ICD-10-CM | POA: Diagnosis not present

## 2018-06-07 ENCOUNTER — Ambulatory Visit
Admission: RE | Admit: 2018-06-07 | Discharge: 2018-06-07 | Disposition: A | Discharge status: Home / Self Care | Payer: BLUE CROSS/BLUE SHIELD | Source: Ambulatory Visit | Attending: Radiation Oncology | Admitting: Radiation Oncology

## 2018-06-07 DIAGNOSIS — C50911 Malignant neoplasm of unspecified site of right female breast: Secondary | ICD-10-CM | POA: Diagnosis not present

## 2018-06-08 ENCOUNTER — Ambulatory Visit
Admission: RE | Admit: 2018-06-08 | Discharge: 2018-06-08 | Disposition: A | Discharge status: Home / Self Care | Payer: BLUE CROSS/BLUE SHIELD | Source: Ambulatory Visit | Attending: Radiation Oncology | Admitting: Radiation Oncology

## 2018-06-08 DIAGNOSIS — C50911 Malignant neoplasm of unspecified site of right female breast: Secondary | ICD-10-CM | POA: Diagnosis not present

## 2018-06-09 ENCOUNTER — Ambulatory Visit
Admission: RE | Admit: 2018-06-09 | Discharge: 2018-06-09 | Disposition: A | Discharge status: Home / Self Care | Payer: BLUE CROSS/BLUE SHIELD | Source: Ambulatory Visit | Attending: Radiation Oncology | Admitting: Radiation Oncology

## 2018-06-09 DIAGNOSIS — C50911 Malignant neoplasm of unspecified site of right female breast: Secondary | ICD-10-CM | POA: Diagnosis not present

## 2018-06-11 ENCOUNTER — Ambulatory Visit: Admission: RE | Admit: 2018-06-11 | Payer: BLUE CROSS/BLUE SHIELD | Source: Ambulatory Visit

## 2018-06-12 ENCOUNTER — Ambulatory Visit
Admission: RE | Admit: 2018-06-12 | Discharge: 2018-06-12 | Disposition: A | Discharge status: Home / Self Care | Payer: BLUE CROSS/BLUE SHIELD | Source: Ambulatory Visit | Attending: Radiation Oncology | Admitting: Radiation Oncology

## 2018-06-12 DIAGNOSIS — C50911 Malignant neoplasm of unspecified site of right female breast: Secondary | ICD-10-CM | POA: Diagnosis not present

## 2018-06-13 ENCOUNTER — Ambulatory Visit
Admission: RE | Admit: 2018-06-13 | Discharge: 2018-06-13 | Disposition: A | Discharge status: Home / Self Care | Payer: BLUE CROSS/BLUE SHIELD | Source: Ambulatory Visit | Attending: Radiation Oncology | Admitting: Radiation Oncology

## 2018-06-13 DIAGNOSIS — C50911 Malignant neoplasm of unspecified site of right female breast: Secondary | ICD-10-CM | POA: Diagnosis not present

## 2018-06-14 ENCOUNTER — Ambulatory Visit
Admission: RE | Admit: 2018-06-14 | Discharge: 2018-06-14 | Disposition: A | Discharge status: Home / Self Care | Payer: BLUE CROSS/BLUE SHIELD | Source: Ambulatory Visit | Attending: Radiation Oncology | Admitting: Radiation Oncology

## 2018-06-14 DIAGNOSIS — C50911 Malignant neoplasm of unspecified site of right female breast: Secondary | ICD-10-CM | POA: Diagnosis not present

## 2018-06-15 ENCOUNTER — Ambulatory Visit
Admission: RE | Admit: 2018-06-15 | Discharge: 2018-06-15 | Disposition: A | Discharge status: Home / Self Care | Payer: BLUE CROSS/BLUE SHIELD | Source: Ambulatory Visit | Attending: Radiation Oncology | Admitting: Radiation Oncology

## 2018-06-15 DIAGNOSIS — C50911 Malignant neoplasm of unspecified site of right female breast: Secondary | ICD-10-CM | POA: Diagnosis not present

## 2018-06-16 ENCOUNTER — Ambulatory Visit
Admission: RE | Admit: 2018-06-16 | Discharge: 2018-06-16 | Disposition: A | Discharge status: Home / Self Care | Payer: BLUE CROSS/BLUE SHIELD | Source: Ambulatory Visit | Attending: Radiation Oncology | Admitting: Radiation Oncology

## 2018-06-16 DIAGNOSIS — C50911 Malignant neoplasm of unspecified site of right female breast: Secondary | ICD-10-CM | POA: Diagnosis not present

## 2018-06-19 ENCOUNTER — Ambulatory Visit
Admission: RE | Admit: 2018-06-19 | Discharge: 2018-06-19 | Disposition: A | Discharge status: Home / Self Care | Payer: BLUE CROSS/BLUE SHIELD | Source: Ambulatory Visit | Attending: Radiation Oncology | Admitting: Radiation Oncology

## 2018-06-19 DIAGNOSIS — C50911 Malignant neoplasm of unspecified site of right female breast: Secondary | ICD-10-CM | POA: Diagnosis not present

## 2018-06-20 ENCOUNTER — Inpatient Hospital Stay: Payer: BLUE CROSS/BLUE SHIELD | Attending: Hematology and Oncology | Admitting: Hematology and Oncology

## 2018-06-20 ENCOUNTER — Ambulatory Visit
Admission: RE | Admit: 2018-06-20 | Discharge: 2018-06-20 | Disposition: A | Discharge status: Home / Self Care | Payer: BLUE CROSS/BLUE SHIELD | Source: Ambulatory Visit | Attending: Radiation Oncology | Admitting: Radiation Oncology

## 2018-06-20 ENCOUNTER — Inpatient Hospital Stay: Payer: BLUE CROSS/BLUE SHIELD

## 2018-06-20 DIAGNOSIS — Z923 Personal history of irradiation: Secondary | ICD-10-CM | POA: Diagnosis not present

## 2018-06-20 DIAGNOSIS — C773 Secondary and unspecified malignant neoplasm of axilla and upper limb lymph nodes: Secondary | ICD-10-CM | POA: Diagnosis not present

## 2018-06-20 DIAGNOSIS — Z170 Estrogen receptor positive status [ER+]: Secondary | ICD-10-CM

## 2018-06-20 DIAGNOSIS — R531 Weakness: Secondary | ICD-10-CM

## 2018-06-20 DIAGNOSIS — Z171 Estrogen receptor negative status [ER-]: Secondary | ICD-10-CM

## 2018-06-20 DIAGNOSIS — Z79899 Other long term (current) drug therapy: Secondary | ICD-10-CM | POA: Diagnosis not present

## 2018-06-20 DIAGNOSIS — C50012 Malignant neoplasm of nipple and areola, left female breast: Secondary | ICD-10-CM

## 2018-06-20 DIAGNOSIS — C50911 Malignant neoplasm of unspecified site of right female breast: Secondary | ICD-10-CM | POA: Diagnosis not present

## 2018-06-20 DIAGNOSIS — Z9221 Personal history of antineoplastic chemotherapy: Secondary | ICD-10-CM | POA: Diagnosis not present

## 2018-06-20 DIAGNOSIS — R5383 Other fatigue: Secondary | ICD-10-CM

## 2018-06-20 DIAGNOSIS — N6331 Unspecified lump in axillary tail of the right breast: Secondary | ICD-10-CM

## 2018-06-20 DIAGNOSIS — C50611 Malignant neoplasm of axillary tail of right female breast: Secondary | ICD-10-CM

## 2018-06-20 LAB — CBC WITH DIFFERENTIAL (CANCER CENTER ONLY)
BASOS ABS: 0 10*3/uL (ref 0.0–0.1)
Basophils Relative: 0 %
EOS PCT: 1 %
Eosinophils Absolute: 0 10*3/uL (ref 0.0–0.5)
HEMATOCRIT: 35.8 % (ref 34.8–46.6)
Hemoglobin: 11.5 g/dL — ABNORMAL LOW (ref 11.6–15.9)
LYMPHS ABS: 0.6 10*3/uL — AB (ref 0.9–3.3)
LYMPHS PCT: 19 %
MCH: 25.7 pg (ref 25.1–34.0)
MCHC: 32.1 g/dL (ref 31.5–36.0)
MCV: 80.1 fL (ref 79.5–101.0)
MONO ABS: 0.5 10*3/uL (ref 0.1–0.9)
Monocytes Relative: 18 %
NEUTROS ABS: 1.9 10*3/uL (ref 1.5–6.5)
Neutrophils Relative %: 62 %
Platelet Count: 212 10*3/uL (ref 145–400)
RBC: 4.48 MIL/uL (ref 3.70–5.45)
RDW: 19 % — ABNORMAL HIGH (ref 11.2–14.5)
WBC Count: 3 10*3/uL — ABNORMAL LOW (ref 3.9–10.3)

## 2018-06-20 LAB — CMP (CANCER CENTER ONLY)
ALBUMIN: 3.6 g/dL (ref 3.5–5.0)
ALT: 13 U/L (ref 0–44)
ANION GAP: 7 (ref 5–15)
AST: 15 U/L (ref 15–41)
Alkaline Phosphatase: 93 U/L (ref 38–126)
BUN: 13 mg/dL (ref 6–20)
CO2: 29 mmol/L (ref 22–32)
Calcium: 9.3 mg/dL (ref 8.9–10.3)
Chloride: 104 mmol/L (ref 98–111)
Creatinine: 0.88 mg/dL (ref 0.44–1.00)
GFR, Est AFR Am: >60 mL/min (ref >60)
GFR, Estimated: >60 mL/min (ref >60)
GLUCOSE: 106 mg/dL — AB (ref 70–99)
POTASSIUM: 3.6 mmol/L (ref 3.5–5.1)
SODIUM: 140 mmol/L (ref 135–145)
TOTAL PROTEIN: 7.4 g/dL (ref 6.5–8.1)
Total Bilirubin: 0.2 mg/dL — ABNORMAL LOW (ref 0.3–1.2)

## 2018-06-20 NOTE — Assessment & Plan Note (Signed)
Prior left breast cancer 2012: Treated with neoadjuvant FEC-Taxotere status post lumpectomy radiation. Recurrence: Right axillary lymph node. Neoadjuvant chemo carboplatin and gemcitabine x6 cycles Right axillary lymph node dissection 1/11 lymph nodes positive  Current treatment: Adjuvant radiation with Xeloda Xeloda toxicities:  Return to clinic in 2 weeks for follow-up with labs

## 2018-06-20 NOTE — Progress Notes (Signed)
Patient Care Team: Patient, [name] as PCP - General ([place]) Neldon Mc, MD as Surgeon (General Surgery) Everardo All, MD (Hematology and Oncology)  DIAGNOSIS:  Encounter Diagnosis  Name Primary?  . 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, estrogen receptor negative ([place])     SUMMARY OF ONCOLOGIC HISTORY:   Breast cancer, IDC, Left, Stage III, Triple negative   06/30/2011 Initial Diagnosis    Breast cancer, IDC, Left, Stage III, Triple negative, 7.6 cm breast mass and palpable axillary mass    09/06/2011 Miscellaneous    BRCA 1 and 2: Negative    09/14/2011 - 11/23/2011 Neo-Adjuvant Chemotherapy    Dose dense FEC followed by dose dense Taxotere    12/21/2011 Surgery    Left lumpectomy: High-grade poorly differentiated IDC 1.7 cm with high-grade DCIS, margins negative, 3/7 lymph nodes positive, ER 0%, PR 0%, HER-2 negative ratio 1.33 T1CN1 stage IIb    12/31/2011 - 02/15/2012 Radiation Therapy    Radiation at [facility]    09/26/2017 Relapse/Recurrence    Left breast upper outer quadrant within the lumpectomy bed: Fibrosis no malignancy, right axillary lymph node biopsy metastatic high-grade carcinoma ER 0%, PR 0%, Ki-67 90%, HER-2 negative ratio 1.11 (similar to previous ductal carcinoma)    11/14/2017 -  Neo-Adjuvant Chemotherapy    Neo-Adjuvant chemotherapy with Gemzar and carboplatin days 1 and 8 every 3 weeks    01/10/2018 Genetic Testing    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  The Hereditary Gene Panel offered by [facility] includes sequencing and/or deletion duplication testing of the following 47 genes: APC, ATM, AXIN2, BARD1, BMPR1A, BRCA1, BRCA2, BRIP1, CDH1, CDK4, CDKN2A (p14ARF), CDKN2A (p16INK4a), CHEK2, CTNNA1, DICER1, EPCAM (Deletion/duplication testing only), GREM1 (promoter region deletion/duplication testing only), KIT, MEN1, MLH1, MSH2, MSH3, MSH6, MUTYH, NBN, NF1, NHTL1, PALB2, PDGFRA,  PMS2, POLD1, POLE, PTEN, RAD50, RAD51C, RAD51D, SDHB, SDHC, SDHD, SMAD4, SMARCA4. STK11, TP53, TSC1, TSC2, and VHL.  The following genes were evaluated for sequence changes only: SDHA and HOXB13 c.251G>A variant only. The report date is January 10, 2018.      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 ([place])   04/06/2018 Surgery    Right axillary lymph node dissection: 1/11 node positive for metastatic high-grade carcinoma    04/25/2018 Initial Diagnosis    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 ([place])    04/25/2018 Cancer Staging    Staging form: Breast, AJCC 8th Edition - Clinical: Stage IIB (cT0, cN1, cM0, G3, ER-, PR-, HER2-) - Signed by Eppie Gibson, MD on 04/25/2018    05/16/2018 -  Radiation Therapy    Adjuvant radiation therapy with Xeloda     CHIEF COMPLIANT: Currently on radiation with Xeloda  INTERVAL HISTORY: ANETTA Jessica Simpson is a 47 year old with above-mentioned history of right axillary lymph node recurrence for which she underwent neoadjuvant chemotherapy followed by surgery and she is currently on radiation with Xeloda.  She appears to be tolerating Xeloda extremely well.  She does not have any significant diarrhea.  She had occasional loose stools.  Does not have any hand-foot syndrome.  She complains of severe fatigue and result of radiation and chemotherapy.  REVIEW OF SYSTEMS:   Constitutional: Denies fevers, chills or abnormal weight loss Eyes: Denies blurriness of vision Ears, nose, mouth, throat, and face: Denies mucositis or sore throat Respiratory: Denies cough, dyspnea or wheezes Cardiovascular: Denies palpitation, chest discomfort Gastrointestinal:  Denies nausea, heartburn or change in  bowel habits Skin: Denies abnormal skin rashes Lymphatics: Denies new lymphadenopathy or easy bruising Neurological:Denies numbness, tingling or new weaknesses Behavioral/Psych: Mood is stable, no new changes  Extremities: No lower extremity edema  All other systems  were reviewed with the patient and are negative.  I have reviewed the past medical history, past surgical history, social history and family history with the patient and they are unchanged from previous note.  ALLERGIES:  is allergic to codeine and lortab [hydrocodone-acetaminophen].  MEDICATIONS:  Current Outpatient Medications  Medication Sig Dispense Refill  . acetaminophen (TYLENOL) 500 MG tablet Take 500 mg by mouth every 6 (six) hours as needed. For pain     . amLODipine (NORVASC) 5 MG tablet Take 5 mg by mouth daily.  6  . capecitabine (XELODA) 500 MG tablet TAKE TWO TABLETS BY MOUTH 2 TIMES DAILY AFTER A MEAL. TAKE ONLY ON DAYS OF RADIATION (MON-FRI) 112 tablet 0  . Cholecalciferol 5000 units capsule Take 1 capsule (5,000 Units total) by mouth daily.    Marland Kitchen gabapentin (NEURONTIN) 100 MG capsule Take 100 mg by mouth at bedtime.    Marland Kitchen ibuprofen (ADVIL,MOTRIN) 400 MG tablet Take 400 mg by mouth every 8 (eight) hours as needed.    . lidocaine-prilocaine (EMLA) cream Apply to affected area once (Patient not taking: Reported on 03/24/2018) 30 g 3  . loratadine (CLARITIN) 10 MG tablet Take 10 mg by mouth daily as needed for allergies.     Marland Kitchen LORazepam (ATIVAN) 0.5 MG tablet Take 1 tablet (0.5 mg total) by mouth at bedtime as needed for sleep. (Patient not taking: Reported on 03/24/2018) 30 tablet 0  . methocarbamol (ROBAXIN) 500 MG tablet Take 1 tablet (500 mg total) by mouth every 8 (eight) hours as needed for muscle spasms. (Patient not taking: Reported on 04/25/2018) 20 tablet 0  . Multiple Vitamin (MULTIVITAMIN) tablet Take 1 tablet by mouth daily.    . ondansetron (ZOFRAN) 8 MG tablet Take 1 tablet (8 mg total) by mouth 2 (two) times daily as needed for refractory nausea / vomiting. Start on day 3 after chemotherapy. (Patient not taking: Reported on 03/24/2018) 30 tablet 1  . oxyCODONE (OXY IR/ROXICODONE) 5 MG immediate release tablet Take 1 tablet (5 mg total) by mouth every 6 (six) hours as  needed for moderate pain. (Patient not taking: Reported on 04/25/2018) 15 tablet 0  . prochlorperazine (COMPAZINE) 10 MG tablet Take 1 tablet (10 mg total) by mouth every 6 (six) hours as needed (Nausea or vomiting). (Patient not taking: Reported on 03/24/2018) 30 tablet 1  . promethazine (PHENERGAN) 12.5 MG tablet Take 1 tablet (12.5 mg total) by mouth every 6 (six) hours as needed for nausea or vomiting. (Patient not taking: Reported on 04/25/2018) 10 tablet 0  . traMADol (ULTRAM) 50 MG tablet Take 2 tablets (100 mg total) by mouth every 6 (six) hours as needed. (Patient not taking: Reported on 03/24/2018) 10 tablet 0  . vitamin B-12 (CYANOCOBALAMIN) 500 MCG tablet Take 500 mcg by mouth daily.     No current facility-administered medications for this visit.    Facility-Administered Medications Ordered in Other Visits  Medication Dose Route Frequency Provider Last Rate Last Dose  . lactated ringers infusion    Continuous PRN Kerby Less, CRNA        PHYSICAL EXAMINATION: ECOG PERFORMANCE STATUS: 1 - Symptomatic but completely ambulatory  Vitals:   06/20/18 1128  BP: (!) 193/117  Pulse: 72  Resp: 18  Temp: 97.8 F (36.6  C)  SpO2: 100%   Filed Weights   06/20/18 1128  Weight: 228 lb 9.6 oz (103.7 kg)    GENERAL:alert, no distress and comfortable SKIN: skin color, texture, turgor are normal, no rashes or significant lesions EYES: normal, Conjunctiva are pink and non-injected, sclera clear OROPHARYNX:no exudate, no erythema and lips, buccal mucosa, and tongue normal  NECK: supple, thyroid normal size, non-tender, without nodularity LYMPH:  no palpable lymphadenopathy in the cervical, axillary or inguinal LUNGS: clear to auscultation and percussion with normal breathing effort HEART: regular rate & rhythm and no murmurs and no lower extremity edema ABDOMEN:abdomen soft, non-tender and normal bowel sounds MUSCULOSKELETAL:no cyanosis of digits and no clubbing  NEURO: alert &  oriented x 3 with fluent speech, no focal motor/sensory deficits EXTREMITIES: No lower extremity edema     LABORATORY DATA:  I have reviewed the data as listed CMP Latest Ref Rng & Units 04/09/2018 03/31/2018 03/06/2018  Glucose 65 - 99 mg/dL 149(H) 102(H) 88  BUN 6 - 20 mg/dL 15 12 14   Creatinine 0.44 - 1.00 mg/dL 0.97 0.98 0.85  Sodium 135 - 145 mmol/L 139 142 141  Potassium 3.5 - 5.1 mmol/L 3.3(L) 3.8 3.7  Chloride 101 - 111 mmol/L 102 106 106  CO2 22 - 32 mmol/L 28 24 26   Calcium 8.9 - 10.3 mg/dL 9.6 9.9 9.9  Total Protein 6.5 - 8.1 g/dL 7.1 - 7.6  Total Bilirubin 0.3 - 1.2 mg/dL 0.4 - 0.3  Alkaline Phos 38 - 126 U/L 79 - 119  AST 15 - 41 U/L 22 - 23  ALT 14 - 54 U/L 20 - 41    Lab Results  Component Value Date   WBC 6.2 04/09/2018   HGB 10.8 (L) 04/09/2018   HCT 35.9 (L) 04/09/2018   MCV 90.4 04/09/2018   PLT 307 04/09/2018   NEUTROABS 3.4 04/09/2018    ASSESSMENT & PLAN:  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 ([place]) Prior left breast cancer 2012: Treated with neoadjuvant FEC-Taxotere status post lumpectomy radiation. Recurrence: Right axillary lymph node. Neoadjuvant chemo carboplatin and gemcitabine x6 cycles Right axillary lymph node dissection 1/11 lymph nodes positive  Current treatment: Adjuvant radiation with Xeloda Xeloda toxicities: 1.  Generalized fatigue and weakness 2. intermittent diarrhea  I discussed with her that the options of treatment after radiation is been complete includes Xeloda for 6 months maintenance therapy versus CMF for 6 months. I will discuss her case with some of my colleagues and then make a decision. I will call her with this decision and then we will arrange appointments accordingly. I filled out disability paperwork to extend her disability until the end of October.  We will make a decision based upon how she is feeling and what the decision is regarding additional chemotherapy.    Orders Placed This Encounter    Procedures  . CBC with Differential (Cancer Center Only)    Standing Status:   Future    Standing Expiration Date:   06/21/2019  . CMP ([place] only)    Standing Status:   Future    Standing Expiration Date:   06/21/2019   The patient has a good understanding of the overall plan. she agrees with it. she will call with any problems that may develop before the next visit here.   Harriette Ohara, MD 06/20/18

## 2018-06-20 NOTE — Progress Notes (Signed)
FMLA paperwork completed by Dr. Lindi Adie.  Faxed to [employer], Employee Relations at [phone].

## 2018-06-21 ENCOUNTER — Ambulatory Visit
Admission: RE | Admit: 2018-06-21 | Discharge: 2018-06-21 | Disposition: A | Discharge status: Home / Self Care | Payer: BLUE CROSS/BLUE SHIELD | Source: Ambulatory Visit | Attending: Radiation Oncology | Admitting: Radiation Oncology

## 2018-06-21 DIAGNOSIS — C50911 Malignant neoplasm of unspecified site of right female breast: Secondary | ICD-10-CM | POA: Diagnosis not present

## 2018-06-21 NOTE — Progress Notes (Signed)
FMLA successfully faxed to [employer]. at [phone]. Mailed copy to patient address on file.

## 2018-06-22 ENCOUNTER — Ambulatory Visit
Admission: RE | Admit: 2018-06-22 | Discharge: 2018-06-22 | Disposition: A | Discharge status: Home / Self Care | Payer: BLUE CROSS/BLUE SHIELD | Source: Ambulatory Visit | Attending: Radiation Oncology | Admitting: Radiation Oncology

## 2018-06-22 ENCOUNTER — Encounter: Payer: Self-pay | Admitting: Radiation Oncology

## 2018-06-22 DIAGNOSIS — C50911 Malignant neoplasm of unspecified site of right female breast: Secondary | ICD-10-CM | POA: Diagnosis not present

## 2018-06-27 NOTE — Progress Notes (Signed)
  Radiation Oncology         [phone] ________________________________  Name: Jessica Simpson MRN: [id number]  Date: 06/22/2018  DOB: Jan 25, 1971  End of Treatment Note  Diagnosis:   47 y.o. female with Right Breast Axillary High Grade Carcinoma of presumed breast Primary, ER- / PR- / Her2-, High grade   Cancer Staging Breast cancer, IDC, Left, Stage III, Triple negative Staging form: Breast, AJCC 7th Edition - Clinical: Stage IIIA (T3, N2, cM0) - Signed by Pieter Partridge, MD on 08/03/2011 Prognostic indicators: Triple negative. KI-67 of 99%    - Pathologic: No stage assigned - Unsigned Prognostic indicators: Triple negative. KI-67 of 99%     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 ([place]) Staging form: Breast, AJCC 8th Edition - Clinical: Stage IIB (cT0, cN1, cM0, G3, ER-, PR-, HER2-) - Signed by Eppie Gibson, MD on 04/25/2018 - Pathologic: No Stage Recommended (ypT0, pN1, cM0, G3, ER-, PR-, HER2-) - Signed by Eppie Gibson, MD on 04/25/2018   Indication for treatment:  Curative       Radiation treatment dates:   05/16/2018 - 06/22/2018  Site/dose:    1. Right Breast / 50.4 Gy in 28 fractions 2. Right posterior axilla and SCV nodes / 50.4 Gy in 28 fractions  Beams/energy:    1. 3D / 10X, 15X Photon 2. 3D / 15X, 6X Photon  Narrative: The patient tolerated radiation treatment relatively well.  She had some expected skin changes as she progressed through treatment, notably hyperpigmentation over the right breast and axilla as well as dry superficial peeling in the axilla. She also reported mild fatigue, intermittent sharp pains in her breast, and some tenderness in her axilla.    Plan: The patient has completed radiation treatment. I advised her to put Neosporin on any skin that develops moist peeling in the future. Otherwise, continue radiaplex as she is doing. The patient will return to radiation oncology clinic for routine followup in one month. I advised them to  call or return sooner if they have any questions or concerns related to their recovery or treatment.  -----------------------------------  Eppie Gibson, MD  This document serves as a record of services personally performed by Eppie Gibson, MD. It was created on her behalf by Rae Lips, a trained medical scribe. The creation of this record is based on the scribe's personal observations and the provider's statements to them. This document has been checked and approved by the attending provider.

## 2018-06-29 ENCOUNTER — Telehealth: Payer: Self-pay | Admitting: Hematology and Oncology

## 2018-06-29 NOTE — Telephone Encounter (Signed)
I left a voicemail for the patient that after discussions with my colleagues and other oncologists it was felt that patient should receive adjuvant CMF chemotherapy. I instructed her to call us back to schedule date and time for starting chemo.

## 2018-07-03 ENCOUNTER — Other Ambulatory Visit: Payer: Self-pay | Admitting: Hematology and Oncology

## 2018-07-03 DIAGNOSIS — N6331 Unspecified lump in axillary tail of the right breast: Secondary | ICD-10-CM

## 2018-07-03 DIAGNOSIS — C50012 Malignant neoplasm of nipple and areola, left female breast: Secondary | ICD-10-CM

## 2018-07-03 MED ORDER — LORAZEPAM 0.5 MG PO TABS: 0.5000 mg | ORAL_TABLET | Freq: Four times a day (QID) | ORAL | 0 refills | Status: DC | PRN

## 2018-07-03 MED ORDER — ONDANSETRON HCL 8 MG PO TABS: 8.0000 mg | ORAL_TABLET | Freq: Two times a day (BID) | ORAL | 1 refills | Status: DC | PRN

## 2018-07-03 MED ORDER — PROCHLORPERAZINE MALEATE 10 MG PO TABS: 10.0000 mg | ORAL_TABLET | Freq: Four times a day (QID) | ORAL | 1 refills | Status: DC | PRN

## 2018-07-03 MED ORDER — LIDOCAINE-PRILOCAINE 2.5-2.5 % EX CREA: TOPICAL_CREAM | CUTANEOUS | 3 refills | Status: DC

## 2018-07-03 NOTE — Progress Notes (Signed)
Dr. Marlou Sa yes

## 2018-07-03 NOTE — Progress Notes (Signed)
DISCONTINUE OFF PATHWAY REGIMEN - Breast   [id number]:Gemcitabine + Carboplatin (1000/2) q21 Days:   A cycle is every 21 days:     Gemcitabine      Carboplatin   **Always confirm dose/schedule in your pharmacy ordering system**  REASON: Other Reason PRIOR TREATMENT: Off Pathway: Gemcitabine + Carboplatin (1000/2) q21 Days TREATMENT RESPONSE: N/A - Adjuvant Therapy  START OFF PATHWAY REGIMEN - Breast   [id number]:CMF (IV cyclophosphamide) q21 days:   A cycle is every 21 days:     Cyclophosphamide      Methotrexate      5-Fluorouracil   **Always confirm dose/schedule in your pharmacy ordering system**  Patient Characteristics: Post-Neoadjuvant Therapy and Resection, HER2 Negative/Unknown/Equivocal, ER Negative/Unknown, Residual Disease, Adjuvant Therapy - Residual Disease After Neoadjuvant Chemotherapy Therapeutic Status: Post-Neoadjuvant Therapy and Resection ER Status: Negative (-) HER2 Status: Negative (-) PR Status: Negative (-) Residual Invasive Disease Post-Neoadjuvant Therapy<= Yes Intent of Therapy: Curative Intent, Discussed with Patient

## 2018-07-05 ENCOUNTER — Telehealth: Payer: Self-pay | Admitting: Hematology and Oncology

## 2018-07-05 ENCOUNTER — Other Ambulatory Visit: Payer: Self-pay | Admitting: Hematology and Oncology

## 2018-07-05 NOTE — Telephone Encounter (Signed)
Called patient regarding 9/17 and  The gap

## 2018-07-10 ENCOUNTER — Other Ambulatory Visit: Payer: Self-pay | Admitting: Hematology and Oncology

## 2018-07-10 NOTE — Assessment & Plan Note (Signed)
Prior left breast cancer 2012: Treated with neoadjuvant FEC-Taxotere status post lumpectomy radiation. Recurrence: Right axillary lymph node. Neoadjuvant chemo carboplatin and gemcitabine x6 cycles Right axillary lymph node dissection 1/11 lymph nodes positive Adjuvant radiation with Xeloda completed 06/19/18  Current treatment: Cycle 1 CMF adj chemo Labs reviewed Anti emetics reviewed  Return to clinic in 1 week for follow-up with labs

## 2018-07-11 ENCOUNTER — Telehealth: Payer: Self-pay | Admitting: Hematology and Oncology

## 2018-07-11 ENCOUNTER — Inpatient Hospital Stay: Payer: BLUE CROSS/BLUE SHIELD | Attending: Hematology and Oncology

## 2018-07-11 ENCOUNTER — Inpatient Hospital Stay (HOSPITAL_BASED_OUTPATIENT_CLINIC_OR_DEPARTMENT_OTHER): Payer: BLUE CROSS/BLUE SHIELD | Admitting: Hematology and Oncology

## 2018-07-11 ENCOUNTER — Inpatient Hospital Stay: Payer: BLUE CROSS/BLUE SHIELD

## 2018-07-11 DIAGNOSIS — N6331 Unspecified lump in axillary tail of the right breast: Secondary | ICD-10-CM

## 2018-07-11 DIAGNOSIS — Z171 Estrogen receptor negative status [ER-]: Secondary | ICD-10-CM | POA: Insufficient documentation

## 2018-07-11 DIAGNOSIS — C50012 Malignant neoplasm of nipple and areola, left female breast: Secondary | ICD-10-CM

## 2018-07-11 DIAGNOSIS — Z923 Personal history of irradiation: Secondary | ICD-10-CM | POA: Insufficient documentation

## 2018-07-11 DIAGNOSIS — C773 Secondary and unspecified malignant neoplasm of axilla and upper limb lymph nodes: Secondary | ICD-10-CM | POA: Diagnosis not present

## 2018-07-11 DIAGNOSIS — Z79899 Other long term (current) drug therapy: Secondary | ICD-10-CM | POA: Diagnosis not present

## 2018-07-11 DIAGNOSIS — Z170 Estrogen receptor positive status [ER+]: Secondary | ICD-10-CM | POA: Diagnosis not present

## 2018-07-11 DIAGNOSIS — Z5111 Encounter for antineoplastic chemotherapy: Secondary | ICD-10-CM | POA: Diagnosis not present

## 2018-07-11 DIAGNOSIS — R110 Nausea: Secondary | ICD-10-CM | POA: Insufficient documentation

## 2018-07-11 DIAGNOSIS — R5383 Other fatigue: Secondary | ICD-10-CM

## 2018-07-11 DIAGNOSIS — R531 Weakness: Secondary | ICD-10-CM | POA: Diagnosis not present

## 2018-07-11 DIAGNOSIS — Z9221 Personal history of antineoplastic chemotherapy: Secondary | ICD-10-CM

## 2018-07-11 DIAGNOSIS — R51 Headache: Secondary | ICD-10-CM | POA: Insufficient documentation

## 2018-07-11 DIAGNOSIS — Z95828 Presence of other vascular implants and grafts: Secondary | ICD-10-CM

## 2018-07-11 LAB — COMPREHENSIVE METABOLIC PANEL
ALBUMIN: 3.7 g/dL (ref 3.5–5.0)
ALT: 13 U/L (ref 0–44)
ANION GAP: 9 (ref 5–15)
AST: 15 U/L (ref 15–41)
Alkaline Phosphatase: 119 U/L (ref 38–126)
BUN: 10 mg/dL (ref 6–20)
CALCIUM: 9.5 mg/dL (ref 8.9–10.3)
CO2: 28 mmol/L (ref 22–32)
CREATININE: 0.94 mg/dL (ref 0.44–1.00)
Chloride: 104 mmol/L (ref 98–111)
GFR calc Af Amer: >60 mL/min (ref >60)
GFR calc non Af Amer: >60 mL/min (ref >60)
GLUCOSE: 113 mg/dL — AB (ref 70–99)
Potassium: 3.5 mmol/L (ref 3.5–5.1)
Sodium: 141 mmol/L (ref 135–145)
TOTAL PROTEIN: 7.7 g/dL (ref 6.5–8.1)
Total Bilirubin: 0.3 mg/dL (ref 0.3–1.2)

## 2018-07-11 LAB — CBC WITH DIFFERENTIAL/PLATELET
BASOS ABS: 0 10*3/uL (ref 0.0–0.1)
BASOS PCT: 0 %
Eosinophils Absolute: 0.1 10*3/uL (ref 0.0–0.5)
Eosinophils Relative: 2 %
HEMATOCRIT: 37 % (ref 34.8–46.6)
HEMOGLOBIN: 12.1 g/dL (ref 11.6–15.9)
LYMPHS PCT: 20 %
Lymphs Abs: 0.7 10*3/uL — ABNORMAL LOW (ref 0.9–3.3)
MCH: 26.2 pg (ref 25.1–34.0)
MCHC: 32.7 g/dL (ref 31.5–36.0)
MCV: 80.1 fL (ref 79.5–101.0)
MONO ABS: 0.5 10*3/uL (ref 0.1–0.9)
Monocytes Relative: 14 %
NEUTROS ABS: 2.2 10*3/uL (ref 1.5–6.5)
NEUTROS PCT: 64 %
PLATELETS: 199 10*3/uL (ref 145–400)
RBC: 4.62 MIL/uL (ref 3.70–5.45)
RDW: 17.9 % — AB (ref 11.2–14.5)
WBC: 3.4 10*3/uL — AB (ref 3.9–10.3)

## 2018-07-11 MED ORDER — SODIUM CHLORIDE 0.9% FLUSH
10.0000 mL | Freq: Once | INTRAVENOUS | Status: AC
  Administered 2018-07-11: 10 mL
  Filled 2018-07-11: qty 10

## 2018-07-11 MED ORDER — PALONOSETRON HCL INJECTION 0.25 MG/5ML
INTRAVENOUS | Status: AC
  Filled 2018-07-11: qty 5

## 2018-07-11 MED ORDER — HEPARIN SOD (PORK) LOCK FLUSH 100 UNIT/ML IV SOLN
500.0000 [IU] | Freq: Once | INTRAVENOUS | Status: AC | PRN
  Administered 2018-07-11: 500 [IU]
  Filled 2018-07-11: qty 5

## 2018-07-11 MED ORDER — SODIUM CHLORIDE 0.9 % IV SOLN
600.0000 mg/m2 | Freq: Once | INTRAVENOUS | Status: AC
  Administered 2018-07-11: 1300 mg via INTRAVENOUS
  Filled 2018-07-11: qty 65

## 2018-07-11 MED ORDER — DEXAMETHASONE SODIUM PHOSPHATE 10 MG/ML IJ SOLN
INTRAMUSCULAR | Status: AC
  Filled 2018-07-11: qty 1

## 2018-07-11 MED ORDER — DEXAMETHASONE SODIUM PHOSPHATE 10 MG/ML IJ SOLN
10.0000 mg | Freq: Once | INTRAMUSCULAR | Status: AC
  Administered 2018-07-11: 10 mg via INTRAVENOUS

## 2018-07-11 MED ORDER — SODIUM CHLORIDE 0.9 % IV SOLN
Freq: Once | INTRAVENOUS | Status: AC
  Administered 2018-07-11: 12:00:00 via INTRAVENOUS
  Filled 2018-07-11: qty 250

## 2018-07-11 MED ORDER — METHOTREXATE SODIUM (PF) CHEMO INJECTION 250 MG/10ML
39.9000 mg/m2 | Freq: Once | INTRAMUSCULAR | Status: AC
  Administered 2018-07-11: 87 mg via INTRAVENOUS
  Filled 2018-07-11: qty 3.48

## 2018-07-11 MED ORDER — FLUOROURACIL CHEMO INJECTION 2.5 GM/50ML
600.0000 mg/m2 | Freq: Once | INTRAVENOUS | Status: AC
  Administered 2018-07-11: 1300 mg via INTRAVENOUS
  Filled 2018-07-11: qty 26

## 2018-07-11 MED ORDER — PALONOSETRON HCL INJECTION 0.25 MG/5ML
0.2500 mg | Freq: Once | INTRAVENOUS | Status: AC
  Administered 2018-07-11: 0.25 mg via INTRAVENOUS

## 2018-07-11 MED ORDER — SODIUM CHLORIDE 0.9% FLUSH
10.0000 mL | INTRAVENOUS | Status: DC | PRN
  Administered 2018-07-11: 10 mL
  Filled 2018-07-11: qty 10

## 2018-07-11 NOTE — Telephone Encounter (Signed)
Per 9/17 no los °

## 2018-07-11 NOTE — Progress Notes (Signed)
Patient Care Team: Patient, [name] as PCP - General ([place]) Neldon Mc, MD as Surgeon (General Surgery) Everardo All, MD (Hematology and Oncology)  DIAGNOSIS:  Encounter Diagnosis  Name Primary?  . 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, unspecified estrogen receptor status ([place])     SUMMARY OF ONCOLOGIC HISTORY:   Breast cancer, IDC, Left, Stage III, Triple negative   06/30/2011 Initial Diagnosis    Breast cancer, IDC, Left, Stage III, Triple negative, 7.6 cm breast mass and palpable axillary mass    09/06/2011 Miscellaneous    BRCA 1 and 2: Negative    09/14/2011 - 11/23/2011 Neo-Adjuvant Chemotherapy    Dose dense FEC followed by dose dense Taxotere    12/21/2011 Surgery    Left lumpectomy: High-grade poorly differentiated IDC 1.7 cm with high-grade DCIS, margins negative, 3/7 lymph nodes positive, ER 0%, PR 0%, HER-2 negative ratio 1.33 T1CN1 stage IIb    12/31/2011 - 02/15/2012 Radiation Therapy    Radiation at [facility]    09/26/2017 Relapse/Recurrence    Left breast upper outer quadrant within the lumpectomy bed: Fibrosis no malignancy, right axillary lymph node biopsy metastatic high-grade carcinoma ER 0%, PR 0%, Ki-67 90%, HER-2 negative ratio 1.11 (similar to previous ductal carcinoma)    11/14/2017 - 03/06/2018 Neo-Adjuvant Chemotherapy    Neo-Adjuvant chemotherapy with Gemzar and carboplatin days 1 and 8 every 3 weeks    01/10/2018 Genetic Testing    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  The Hereditary Gene Panel offered by [facility] includes sequencing and/or deletion duplication testing of the following 47 genes: APC, ATM, AXIN2, BARD1, BMPR1A, BRCA1, BRCA2, BRIP1, CDH1, CDK4, CDKN2A (p14ARF), CDKN2A (p16INK4a), CHEK2, CTNNA1, DICER1, EPCAM (Deletion/duplication testing only), GREM1 (promoter region deletion/duplication testing only), KIT, MEN1, MLH1, MSH2, MSH3, MSH6, MUTYH, NBN, NF1, NHTL1,  PALB2, PDGFRA, PMS2, POLD1, POLE, PTEN, RAD50, RAD51C, RAD51D, SDHB, SDHC, SDHD, SMAD4, SMARCA4. STK11, TP53, TSC1, TSC2, and VHL.  The following genes were evaluated for sequence changes only: SDHA and HOXB13 c.251G>A variant only. The report date is January 10, 2018.      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 ([place])   04/06/2018 Surgery    Right axillary lymph node dissection: 1/11 node positive for metastatic high-grade carcinoma    04/25/2018 Initial Diagnosis    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 ([place])    04/25/2018 Cancer Staging    Staging form: Breast, AJCC 8th Edition - Clinical: Stage IIB (cT0, cN1, cM0, G3, ER-, PR-, HER2-) - Signed by Eppie Gibson, MD on 04/25/2018    05/16/2018 - 06/22/2018 Radiation Therapy    Adjuvant radiation therapy with Xeloda    07/11/2018 -  Chemotherapy    Adjuvant chemotherapy with CMF      CHIEF COMPLIANT: Cycle 1 adjuvant CMF  INTERVAL HISTORY: Jessica Simpson is a 47 year old with above-mentioned history of triple negative right breast cancer who underwent neoadjuvant chemotherapy with carboplatin and gemcitabine followed by surgery.  She still had persistent disease and finished adjuvant radiation with Xeloda.  She is here today to begin her cycle of adjuvant chemotherapy with CMF.  REVIEW OF SYSTEMS:   Constitutional: Denies fevers, chills or abnormal weight loss Eyes: Denies blurriness of vision Ears, nose, mouth, throat, and face: Denies mucositis or sore throat Respiratory: Denies cough, dyspnea or wheezes Cardiovascular: Denies palpitation, chest discomfort Gastrointestinal:  Denies nausea, heartburn or change in bowel habits Skin: Denies abnormal skin rashes Lymphatics:  Denies new lymphadenopathy or easy bruising Neurological:Denies numbness, tingling or new weaknesses Behavioral/Psych: Mood is stable, no new changes  Extremities: No lower extremity edema   All other systems were reviewed with the patient and are  negative.  I have reviewed the past medical history, past surgical history, social history and family history with the patient and they are unchanged from previous note.  ALLERGIES:  is allergic to codeine and lortab [hydrocodone-acetaminophen].  MEDICATIONS:  Current Outpatient Medications  Medication Sig Dispense Refill  . acetaminophen (TYLENOL) 500 MG tablet Take 500 mg by mouth every 6 (six) hours as needed. For pain     . amLODipine (NORVASC) 5 MG tablet Take 5 mg by mouth daily.  6  . capecitabine (XELODA) 500 MG tablet TAKE TWO TABLETS BY MOUTH 2 TIMES DAILY AFTER A MEAL. TAKE ONLY ON DAYS OF RADIATION (MON-FRI) 112 tablet 0  . Cholecalciferol 5000 units capsule Take 1 capsule (5,000 Units total) by mouth daily.    Marland Kitchen gabapentin (NEURONTIN) 100 MG capsule Take 100 mg by mouth at bedtime.    Marland Kitchen ibuprofen (ADVIL,MOTRIN) 400 MG tablet Take 400 mg by mouth every 8 (eight) hours as needed.    . lidocaine-prilocaine (EMLA) cream Apply to affected area once 30 g 3  . loratadine (CLARITIN) 10 MG tablet Take 10 mg by mouth daily as needed for allergies.     Marland Kitchen LORazepam (ATIVAN) 0.5 MG tablet Take 1 tablet (0.5 mg total) by mouth every 6 (six) hours as needed (Nausea or vomiting). 30 tablet 0  . methocarbamol (ROBAXIN) 500 MG tablet Take 1 tablet (500 mg total) by mouth every 8 (eight) hours as needed for muscle spasms. (Patient not taking: Reported on 04/25/2018) 20 tablet 0  . Multiple Vitamin (MULTIVITAMIN) tablet Take 1 tablet by mouth daily.    . ondansetron (ZOFRAN) 8 MG tablet Take 1 tablet (8 mg total) by mouth 2 (two) times daily as needed for refractory nausea / vomiting. Start on day 3 after chemotherapy. 30 tablet 1  . oxyCODONE (OXY IR/ROXICODONE) 5 MG immediate release tablet Take 1 tablet (5 mg total) by mouth every 6 (six) hours as needed for moderate pain. (Patient not taking: Reported on 04/25/2018) 15 tablet 0  . prochlorperazine (COMPAZINE) 10 MG tablet Take 1 tablet (10 mg total)  by mouth every 6 (six) hours as needed (Nausea or vomiting). 30 tablet 1  . promethazine (PHENERGAN) 12.5 MG tablet Take 1 tablet (12.5 mg total) by mouth every 6 (six) hours as needed for nausea or vomiting. (Patient not taking: Reported on 04/25/2018) 10 tablet 0  . traMADol (ULTRAM) 50 MG tablet Take 2 tablets (100 mg total) by mouth every 6 (six) hours as needed. (Patient not taking: Reported on 03/24/2018) 10 tablet 0  . vitamin B-12 (CYANOCOBALAMIN) 500 MCG tablet Take 500 mcg by mouth daily.     No current facility-administered medications for this visit.    Facility-Administered Medications Ordered in Other Visits  Medication Dose Route Frequency Provider Last Rate Last Dose  . lactated ringers infusion    Continuous PRN Kerby Less, CRNA        PHYSICAL EXAMINATION: ECOG PERFORMANCE STATUS: 1 - Symptomatic but completely ambulatory  Vitals:   07/11/18 1029  BP: (!) 187/103  Pulse: 71  Resp: 18  Temp: 98.3 F (36.8 C)  SpO2: 100%   Filed Weights   07/11/18 1029  Weight: 227 lb (103 kg)    GENERAL:alert, no distress and comfortable SKIN: skin  color, texture, turgor are normal, no rashes or significant lesions EYES: normal, Conjunctiva are pink and non-injected, sclera clear OROPHARYNX:no exudate, no erythema and lips, buccal mucosa, and tongue normal  NECK: supple, thyroid normal size, non-tender, without nodularity LYMPH:  no palpable lymphadenopathy in the cervical, axillary or inguinal LUNGS: clear to auscultation and percussion with normal breathing effort HEART: regular rate & rhythm and no murmurs and no lower extremity edema ABDOMEN:abdomen soft, non-tender and normal bowel sounds MUSCULOSKELETAL:no cyanosis of digits and no clubbing  NEURO: alert & oriented x 3 with fluent speech, no focal motor/sensory deficits EXTREMITIES: No lower extremity edema   LABORATORY DATA:  I have reviewed the data as listed CMP Latest Ref Rng & Units 06/20/2018 04/09/2018  03/31/2018  Glucose 70 - 99 mg/dL 106(H) 149(H) 102(H)  BUN 6 - 20 mg/dL _0 Creatinine 0.44 - 1.00 mg/dL 0.88 0.97 0.98  Sodium 135 - 145 mmol/L 140 139 142  Potassium 3.5 - 5.1 mmol/L 3.6 3.3(L) 3.8  Chloride 98 - 111 mmol/L 104 102 106  CO2 22 - 32 mmol/L _1 Calcium 8.9 - 10.3 mg/dL 9.3 9.6 9.9  Total Protein 6.5 - 8.1 g/dL 7.4 7.1 -  Total Bilirubin 0.3 - 1.2 mg/dL 0.2(L) 0.4 -  Alkaline Phos 38 - 126 U/L 93 79 -  AST 15 - 41 U/L 15 22 -  ALT 0 - 44 U/L 13 20 -    Lab Results  Component Value Date   WBC 3.4 (L) 07/11/2018   HGB 12.1 07/11/2018   HCT 37.0 07/11/2018   MCV 80.1 07/11/2018   PLT 199 07/11/2018   NEUTROABS 2.2 07/11/2018    ASSESSMENT & PLAN:  Breast cancer, IDC, Left, Stage III, Triple negative Prior left breast cancer 2012: Treated with neoadjuvant FEC-Taxotere status post lumpectomy radiation. Recurrence: Right axillary lymph node. Neoadjuvant chemo carboplatin and gemcitabine x6 cycles Right axillary lymph node dissection 1/11 lymph nodes positive Adjuvant radiation with Xeloda completed 06/19/18  Current treatment: Cycle 1 CMF adj chemo Labs reviewed Anti emetics reviewed  Return to clinic in 1 week for follow-up with labs    No orders of the defined types were placed in this encounter.  The patient has a good understanding of the overall plan. she agrees with it. she will call with any problems that may develop before the next visit here.   Harriette Ohara, MD 07/11/18

## 2018-07-11 NOTE — Patient Instructions (Signed)
Methotrexate injection What is this medicine? METHOTREXATE (METH oh TREX ate) is a chemotherapy drug used to treat cancer including breast cancer, leukemia, and lymphoma. This medicine can also be used to treat psoriasis and certain kinds of arthritis. This medicine may be used for other purposes; ask your health care provider or pharmacist if you have questions. What should I tell my health care provider before I take this medicine? They need to know if you have any of these conditions: -fluid in the stomach area or lungs -if you often drink alcohol -infection or immune system problems -kidney disease -liver disease -low blood counts, like low white cell, platelet, or red cell counts -lung disease -radiation therapy -stomach ulcers -ulcerative colitis -an unusual or allergic reaction to methotrexate, other medicines, foods, dyes, or preservatives -pregnant or trying to get pregnant -breast-feeding How should I use this medicine? This medicine is for infusion into a vein or for injection into muscle or into the spinal fluid (whichever applies). It is usually given by a health care professional in a hospital or clinic setting. In rare cases, you might get this medicine at home. You will be taught how to give this medicine. Use exactly as directed. Take your medicine at regular intervals. Do not take your medicine more often than directed. If this medicine is used for arthritis or psoriasis, it should be taken weekly, NOT daily. It is important that you put your used needles and syringes in a special sharps container. Do not put them in a trash can. If you do not have a sharps container, call your pharmacist or healthcare provider to get one. Talk to your pediatrician regarding the use of this medicine in children. While this drug may be prescribed for children as young as 2 years for selected conditions, precautions do apply. Overdosage: If you think you have taken too much of this medicine  contact a poison control center or emergency room at once. NOTE: This medicine is only for you. Do not share this medicine with others. What if I miss a dose? It is important not to miss your dose. Call your doctor or health care professional if you are unable to keep an appointment. If you give yourself the medicine and you miss a dose, talk with your doctor or health care professional. Do not take double or extra doses. What may interact with this medicine? This medicine may interact with the following medications: -acitretin -aspirin or aspirin-like medicines including salicylates -azathioprine -certain antibiotics like chloramphenicol, penicillin, tetracycline -certain medicines for stomach problems like esomeprazole, omeprazole, pantoprazole -cyclosporine -gold -hydroxychloroquine -live virus vaccines -mercaptopurine -NSAIDs, medicines for pain and inflammation, like ibuprofen or naproxen -other cytotoxic agents -penicillamine -phenylbutazone -phenytoin -probenacid -retinoids such as isotretinoin and tretinoin -steroid medicines like prednisone or cortisone -sulfonamides like sulfasalazine and trimethoprim/sulfamethoxazole -theophylline This list may not describe all possible interactions. Give your health care provider a list of all the medicines, herbs, non-prescription drugs, or dietary supplements you use. Also tell them if you smoke, drink alcohol, or use illegal drugs. Some items may interact with your medicine. What should I watch for while using this medicine? Avoid alcoholic drinks. In some cases, you may be given additional medicines to help with side effects. Follow all directions for their use. This medicine can make you more sensitive to the sun. Keep out of the sun. If you cannot avoid being in the sun, wear protective clothing and use sunscreen. Do not use sun lamps or tanning beds/booths. You may get drowsy   or dizzy. Do not drive, use machinery, or do anything that  needs mental alertness until you know how this medicine affects you. Do not stand or sit up quickly, especially if you are an older patient. This reduces the risk of dizzy or fainting spells. You may need blood work done while you are taking this medicine. Call your doctor or health care professional for advice if you get a fever, chills or sore throat, or other symptoms of a cold or flu. Do not treat yourself. This drug decreases your body's ability to fight infections. Try to avoid being around people who are sick. This medicine may increase your risk to bruise or bleed. Call your doctor or health care professional if you notice any unusual bleeding. Check with your doctor or health care professional if you get an attack of severe diarrhea, nausea and vomiting, or if you sweat a lot. The loss of too much body fluid can make it dangerous for you to take this medicine. Talk to your doctor about your risk of cancer. You may be more at risk for certain types of cancers if you take this medicine. Both men and women must use effective birth control with this medicine. Do not become pregnant while taking this medicine or until at least 1 normal menstrual cycle has occurred after stopping it. Women should inform their doctor if they wish to become pregnant or think they might be pregnant. Men should not father a child while taking this medicine and for 3 months after stopping it. There is a potential for serious side effects to an unborn child. Talk to your health care professional or pharmacist for more information. Do not breast-feed an infant while taking this medicine. What side effects may I notice from receiving this medicine? Side effects that you should report to your doctor or health care professional as soon as possible: -allergic reactions like skin rash, itching or hives, swelling of the face, lips, or tongue -back pain -breathing problems or shortness of breath -confusion -diarrhea -dry,  nonproductive cough -low blood counts - this medicine may decrease the number of [place] blood cells, red blood cells and platelets. You may be at increased risk of infections and bleeding -mouth sores -redness, blistering, peeling or loosening of the skin, including inside the mouth -seizures -severe headaches -signs of infection - fever or chills, cough, sore throat, pain or difficulty passing urine -signs and symptoms of bleeding such as bloody or black, tarry stools; red or dark-brown urine; spitting up blood or brown material that looks like coffee grounds; red spots on the skin; unusual bruising or bleeding from the eye, gums, or nose -signs and symptoms of kidney injury like trouble passing urine or change in the amount of urine -signs and symptoms of liver injury like dark yellow or brown urine; general ill feeling or flu-like symptoms; light-colored stools; loss of appetite; nausea; right upper belly pain; unusually weak or tired; yellowing of the eyes or skin -stiff neck -vomiting Side effects that usually do not require medical attention (report to your doctor or health care professional if they continue or are bothersome): -dizziness -hair loss -headache -stomach pain -upset stomach This list may not describe all possible side effects. Call your doctor for medical advice about side effects. You may report side effects to FDA at 1-800-FDA-1088. Where should I keep my medicine? If you are using this medicine at home, you will be instructed on how to store this medicine. Throw away any unused medicine after   the expiration date on the label. NOTE: This sheet is a summary. It may not cover all possible information. If you have questions about this medicine, talk to your doctor, pharmacist, or health care provider.  2018 Elsevier/Gold Standard (2015-01-30 12:36:41) Cyclophosphamide injection What is this medicine? CYCLOPHOSPHAMIDE (sye kloe FOSS fa mide) is a chemotherapy drug. It  slows the growth of cancer cells. This medicine is used to treat many types of cancer like lymphoma, myeloma, leukemia, breast cancer, and ovarian cancer, to name a few. This medicine may be used for other purposes; ask your health care provider or pharmacist if you have questions. COMMON BRAND NAME(S): Cytoxan, Neosar What should I tell my health care provider before I take this medicine? They need to know if you have any of these conditions: -blood disorders -history of other chemotherapy -infection -kidney disease -liver disease -recent or ongoing radiation therapy -tumors in the bone marrow -an unusual or allergic reaction to cyclophosphamide, other chemotherapy, other medicines, foods, dyes, or preservatives -pregnant or trying to get pregnant -breast-feeding How should I use this medicine? This drug is usually given as an injection into a vein or muscle or by infusion into a vein. It is administered in a hospital or clinic by a specially trained health care professional. Talk to your pediatrician regarding the use of this medicine in children. Special care may be needed. Overdosage: If you think you have taken too much of this medicine contact a poison control center or emergency room at once. NOTE: This medicine is only for you. Do not share this medicine with others. What if I miss a dose? It is important not to miss your dose. Call your doctor or health care professional if you are unable to keep an appointment. What may interact with this medicine? This medicine may interact with the following medications: -amiodarone -amphotericin B -azathioprine -certain antiviral medicines for HIV or AIDS such as protease inhibitors (e.g., indinavir, ritonavir) and zidovudine -certain blood pressure medications such as benazepril, captopril, enalapril, fosinopril, lisinopril, moexipril, monopril, perindopril, quinapril, ramipril, trandolapril -certain cancer medications such as  anthracyclines (e.g., daunorubicin, doxorubicin), busulfan, cytarabine, paclitaxel, pentostatin, tamoxifen, trastuzumab -certain diuretics such as chlorothiazide, chlorthalidone, hydrochlorothiazide, indapamide, metolazone -certain medicines that treat or prevent blood clots like warfarin -certain muscle relaxants such as succinylcholine -cyclosporine -etanercept -indomethacin -medicines to increase blood counts like filgrastim, pegfilgrastim, sargramostim -medicines used as general anesthesia -metronidazole -natalizumab This list may not describe all possible interactions. Give your health care provider a list of all the medicines, herbs, non-prescription drugs, or dietary supplements you use. Also tell them if you smoke, drink alcohol, or use illegal drugs. Some items may interact with your medicine. What should I watch for while using this medicine? Visit your doctor for checks on your progress. This drug may make you feel generally unwell. This is not uncommon, as chemotherapy can affect healthy cells as well as cancer cells. Report any side effects. Continue your course of treatment even though you feel ill unless your doctor tells you to stop. Drink water or other fluids as directed. Urinate often, even at night. In some cases, you may be given additional medicines to help with side effects. Follow all directions for their use. Call your doctor or health care professional for advice if you get a fever, chills or sore throat, or other symptoms of a cold or flu. Do not treat yourself. This drug decreases your body's ability to fight infections. Try to avoid being around people who are sick. This  medicine may increase your risk to bruise or bleed. Call your doctor or health care professional if you notice any unusual bleeding. Be careful brushing and flossing your teeth or using a toothpick because you may get an infection or bleed more easily. If you have any dental work done, tell your dentist  you are receiving this medicine. You may get drowsy or dizzy. Do not drive, use machinery, or do anything that needs mental alertness until you know how this medicine affects you. Do not become pregnant while taking this medicine or for 1 year after stopping it. Women should inform their doctor if they wish to become pregnant or think they might be pregnant. Men should not father a child while taking this medicine and for 4 months after stopping it. There is a potential for serious side effects to an unborn child. Talk to your health care professional or pharmacist for more information. Do not breast-feed an infant while taking this medicine. This medicine may interfere with the ability to have a child. This medicine has caused ovarian failure in some women. This medicine has caused reduced sperm counts in some men. You should talk with your doctor or health care professional if you are concerned about your fertility. If you are going to have surgery, tell your doctor or health care professional that you have taken this medicine. What side effects may I notice from receiving this medicine? Side effects that you should report to your doctor or health care professional as soon as possible: -allergic reactions like skin rash, itching or hives, swelling of the face, lips, or tongue -low blood counts - this medicine may decrease the number of white blood cells, red blood cells and platelets. You may be at increased risk for infections and bleeding. -signs of infection - fever or chills, cough, sore throat, pain or difficulty passing urine -signs of decreased platelets or bleeding - bruising, pinpoint red spots on the skin, black, tarry stools, blood in the urine -signs of decreased red blood cells - unusually weak or tired, fainting spells, lightheadedness -breathing problems -dark urine -dizziness -palpitations -swelling of the ankles, feet, hands -trouble passing urine or change in the amount of  urine -weight gain -yellowing of the eyes or skin Side effects that usually do not require medical attention (report to your doctor or health care professional if they continue or are bothersome): -changes in nail or skin color -hair loss -missed menstrual periods -mouth sores -nausea, vomiting This list may not describe all possible side effects. Call your doctor for medical advice about side effects. You may report side effects to FDA at 1-800-FDA-1088. Where should I keep my medicine? This drug is given in a hospital or clinic and will not be stored at home. NOTE: This sheet is a summary. It may not cover all possible information. If you have questions about this medicine, talk to your doctor, pharmacist, or health care provider.  2018 Elsevier/Gold Standard (2012-08-25 16:22:58)

## 2018-07-12 ENCOUNTER — Telehealth: Payer: Self-pay

## 2018-07-12 NOTE — Telephone Encounter (Signed)
Pt complains of a headache. She wants to know if she is able to take anything over the counter, since she just finished her first time chemo yesterday. Advised pt that she can take otc pain relievers for headache and to increase her po fluid intake. Also suggested that she take something for nausea before eating. Sometimes, headaches can cause a lot of nausea and decrease appetite. Told pt to call in the next 24 hrs if headache is not relieved by otc medication and plenty of fluids/rest. Pt verbalized understanding.

## 2018-07-17 ENCOUNTER — Encounter: Payer: Self-pay | Admitting: Radiation Oncology

## 2018-07-17 NOTE — Assessment & Plan Note (Signed)
Prior left breast cancer 2012: Treated with neoadjuvant FEC-Taxotere status post lumpectomy radiation. Recurrence: Right axillary lymph node. Neoadjuvant chemo carboplatin and gemcitabine x6 cycles Right axillary lymph node dissection 1/11 lymph nodes positive Adjuvant radiation with Xeloda completed 06/19/18  Current treatment: Cycle 1 day 8 CMF adj chemo Labs reviewed Chemo Toxicities:  RTC in 2 weeks for cycle 2

## 2018-07-17 NOTE — Progress Notes (Signed)
Ms. Ozturk presents for follow up of radiation completed 06/22/18 to her Right breast and Right posterior axilla and SCV nodes. She received her first dose of adjuvant CMF therapy on 07/11/18 and the next cycle is scheduled for 08/01/18. She last saw Dr. Lindi Adie on 07/18/18. She has recovered well from radiation. She is not using any cream at this time. I have suggested she begin using vitamin E lotion or oil to her radiation site.   BP (!) 189/99 (BP Location: Left Leg)   Pulse 74   Temp 97.6 F (36.4 C) (Oral)   Ht 5\' 5"  (1.651 m)   Wt 226 lb 6.4 oz (102.7 kg)   SpO2 100% Comment: room air  BMI 37.67 kg/m    Wt Readings from Last 3 Encounters:  07/21/18 226 lb 6.4 oz (102.7 kg)  07/18/18 226 lb (102.5 kg)  07/11/18 227 lb (103 kg)

## 2018-07-18 ENCOUNTER — Inpatient Hospital Stay: Payer: BLUE CROSS/BLUE SHIELD

## 2018-07-18 ENCOUNTER — Inpatient Hospital Stay (HOSPITAL_BASED_OUTPATIENT_CLINIC_OR_DEPARTMENT_OTHER): Payer: BLUE CROSS/BLUE SHIELD | Admitting: Hematology and Oncology

## 2018-07-18 DIAGNOSIS — C50012 Malignant neoplasm of nipple and areola, left female breast: Secondary | ICD-10-CM

## 2018-07-18 DIAGNOSIS — R51 Headache: Secondary | ICD-10-CM

## 2018-07-18 DIAGNOSIS — N6331 Unspecified lump in axillary tail of the right breast: Secondary | ICD-10-CM

## 2018-07-18 DIAGNOSIS — Z79899 Other long term (current) drug therapy: Secondary | ICD-10-CM

## 2018-07-18 DIAGNOSIS — R110 Nausea: Secondary | ICD-10-CM

## 2018-07-18 DIAGNOSIS — Z171 Estrogen receptor negative status [ER-]: Secondary | ICD-10-CM | POA: Diagnosis not present

## 2018-07-18 DIAGNOSIS — C773 Secondary and unspecified malignant neoplasm of axilla and upper limb lymph nodes: Secondary | ICD-10-CM | POA: Diagnosis not present

## 2018-07-18 DIAGNOSIS — Z923 Personal history of irradiation: Secondary | ICD-10-CM

## 2018-07-18 DIAGNOSIS — Z5111 Encounter for antineoplastic chemotherapy: Secondary | ICD-10-CM | POA: Diagnosis not present

## 2018-07-18 LAB — CMP (CANCER CENTER ONLY)
ALBUMIN: 3.7 g/dL (ref 3.5–5.0)
ALT: 13 U/L (ref 0–44)
ANION GAP: 9 (ref 5–15)
AST: 11 U/L — ABNORMAL LOW (ref 15–41)
Alkaline Phosphatase: 98 U/L (ref 38–126)
BILIRUBIN TOTAL: 0.3 mg/dL (ref 0.3–1.2)
BUN: 12 mg/dL (ref 6–20)
CO2: 25 mmol/L (ref 22–32)
Calcium: 9.2 mg/dL (ref 8.9–10.3)
Chloride: 102 mmol/L (ref 98–111)
Creatinine: 0.85 mg/dL (ref 0.44–1.00)
GFR, Est AFR Am: >60 mL/min (ref >60)
GFR, Estimated: >60 mL/min (ref >60)
GLUCOSE: 113 mg/dL — AB (ref 70–99)
POTASSIUM: 3.5 mmol/L (ref 3.5–5.1)
SODIUM: 136 mmol/L (ref 135–145)
TOTAL PROTEIN: 7.4 g/dL (ref 6.5–8.1)

## 2018-07-18 LAB — CBC WITH DIFFERENTIAL (CANCER CENTER ONLY)
BASOS PCT: 0 %
Basophils Absolute: 0 10*3/uL (ref 0.0–0.1)
EOS ABS: 0.1 10*3/uL (ref 0.0–0.5)
EOS PCT: 3 %
HEMATOCRIT: 35.3 % (ref 34.8–46.6)
Hemoglobin: 11.5 g/dL — ABNORMAL LOW (ref 11.6–15.9)
Lymphocytes Relative: 14 %
Lymphs Abs: 0.5 10*3/uL — ABNORMAL LOW (ref 0.9–3.3)
MCH: 25.8 pg (ref 25.1–34.0)
MCHC: 32.6 g/dL (ref 31.5–36.0)
MCV: 79.3 fL — ABNORMAL LOW (ref 79.5–101.0)
MONO ABS: 0.4 10*3/uL (ref 0.1–0.9)
Monocytes Relative: 12 %
Neutro Abs: 2.6 10*3/uL (ref 1.5–6.5)
Neutrophils Relative %: 71 %
Platelet Count: 184 10*3/uL (ref 145–400)
RBC: 4.45 MIL/uL (ref 3.70–5.45)
RDW: 17.8 % — AB (ref 11.2–14.5)
WBC Count: 3.6 10*3/uL — ABNORMAL LOW (ref 3.9–10.3)

## 2018-07-18 NOTE — Progress Notes (Signed)
Patient Care Team: Patient, [name] as PCP - General ([facility]) Neldon Mc, MD as Surgeon (General Surgery) Everardo All, MD (Hematology and Oncology)  DIAGNOSIS:  Encounter Diagnosis  Name Primary?  . 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, unspecified estrogen receptor status ([place])     SUMMARY OF ONCOLOGIC HISTORY:   Breast cancer, IDC, Left, Stage III, Triple negative   06/30/2011 Initial Diagnosis    Breast cancer, IDC, Left, Stage III, Triple negative, 7.6 cm breast mass and palpable axillary mass    09/06/2011 Miscellaneous    BRCA 1 and 2: Negative    09/14/2011 - 11/23/2011 Neo-Adjuvant Chemotherapy    Dose dense FEC followed by dose dense Taxotere    12/21/2011 Surgery    Left lumpectomy: High-grade poorly differentiated IDC 1.7 cm with high-grade DCIS, margins negative, 3/7 lymph nodes positive, ER 0%, PR 0%, HER-2 negative ratio 1.33 T1CN1 stage IIb    12/31/2011 - 02/15/2012 Radiation Therapy    Radiation at [facility]    09/26/2017 Relapse/Recurrence    Left breast upper outer quadrant within the lumpectomy bed: Fibrosis no malignancy, right axillary lymph node biopsy metastatic high-grade carcinoma ER 0%, PR 0%, Ki-67 90%, HER-2 negative ratio 1.11 (similar to previous ductal carcinoma)    11/14/2017 - 03/06/2018 Neo-Adjuvant Chemotherapy    Neo-Adjuvant chemotherapy with Gemzar and carboplatin days 1 and 8 every 3 weeks    01/10/2018 Genetic Testing    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  The Hereditary Gene Panel offered by [facility] includes sequencing and/or deletion duplication testing of the following 47 genes: APC, ATM, AXIN2, BARD1, BMPR1A, BRCA1, BRCA2, BRIP1, CDH1, CDK4, CDKN2A (p14ARF), CDKN2A (p16INK4a), CHEK2, CTNNA1, DICER1, EPCAM (Deletion/duplication testing only), GREM1 (promoter region deletion/duplication testing only), KIT, MEN1, MLH1, MSH2, MSH3, MSH6, MUTYH, NBN, NF1, NHTL1,  PALB2, PDGFRA, PMS2, POLD1, POLE, PTEN, RAD50, RAD51C, RAD51D, SDHB, SDHC, SDHD, SMAD4, SMARCA4. STK11, TP53, TSC1, TSC2, and VHL.  The following genes were evaluated for sequence changes only: SDHA and HOXB13 c.251G>A variant only. The report date is January 10, 2018.      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 ([place])   04/06/2018 Surgery    Right axillary lymph node dissection: 1/11 node positive for metastatic high-grade carcinoma    04/25/2018 Initial Diagnosis    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 ([place])    04/25/2018 Cancer Staging    Staging form: Breast, AJCC 8th Edition - Clinical: Stage IIB (cT0, cN1, cM0, G3, ER-, PR-, HER2-) - Signed by Eppie Gibson, MD on 04/25/2018    05/16/2018 - 06/22/2018 Radiation Therapy    Adjuvant radiation therapy with Xeloda    07/11/2018 -  Chemotherapy    Adjuvant chemotherapy with CMF      CHIEF COMPLIANT: Cycle 1 day 8 CMF  INTERVAL HISTORY: Jessica Simpson is a 32-year with above-mentioned history of bilateral breast cancers who is currently on adjuvant chemotherapy with CMF and today is cycle 1 day 8.  She had mild nausea and headache after chemo but it had improved after the first day with Motrin.  She denies any other major complaints.  She is able to eat well.  She did not have any fevers or chills.  REVIEW OF SYSTEMS:   Constitutional: Denies fevers, chills or abnormal weight loss Eyes: Denies blurriness of vision Ears, nose, mouth, throat, and face: Denies mucositis or sore throat Respiratory: Denies cough, dyspnea or wheezes Cardiovascular: Denies  palpitation, chest discomfort Gastrointestinal:  Denies nausea, heartburn or change in bowel habits Skin: Denies abnormal skin rashes Lymphatics: Denies new lymphadenopathy or easy bruising Neurological:Denies numbness, tingling or new weaknesses Behavioral/Psych: Mood is stable, no new changes  Extremities: No lower extremity edema  All other systems were reviewed with  the patient and are negative.  I have reviewed the past medical history, past surgical history, social history and family history with the patient and they are unchanged from previous note.  ALLERGIES:  is allergic to codeine and lortab [hydrocodone-acetaminophen].  MEDICATIONS:  Current Outpatient Medications  Medication Sig Dispense Refill  . acetaminophen (TYLENOL) 500 MG tablet Take 500 mg by mouth every 6 (six) hours as needed. For pain     . amLODipine (NORVASC) 5 MG tablet Take 5 mg by mouth daily.  6  . Cholecalciferol 5000 units capsule Take 1 capsule (5,000 Units total) by mouth daily.    Marland Kitchen gabapentin (NEURONTIN) 100 MG capsule Take 100 mg by mouth at bedtime.    Marland Kitchen ibuprofen (ADVIL,MOTRIN) 400 MG tablet Take 400 mg by mouth every 8 (eight) hours as needed.    . lidocaine-prilocaine (EMLA) cream Apply to affected area once 30 g 3  . loratadine (CLARITIN) 10 MG tablet Take 10 mg by mouth daily as needed for allergies.     Marland Kitchen LORazepam (ATIVAN) 0.5 MG tablet Take 1 tablet (0.5 mg total) by mouth every 6 (six) hours as needed (Nausea or vomiting). 30 tablet 0  . methocarbamol (ROBAXIN) 500 MG tablet Take 1 tablet (500 mg total) by mouth every 8 (eight) hours as needed for muscle spasms. (Patient not taking: Reported on 04/25/2018) 20 tablet 0  . Multiple Vitamin (MULTIVITAMIN) tablet Take 1 tablet by mouth daily.    . ondansetron (ZOFRAN) 8 MG tablet Take 1 tablet (8 mg total) by mouth 2 (two) times daily as needed for refractory nausea / vomiting. Start on day 3 after chemotherapy. 30 tablet 1  . oxyCODONE (OXY IR/ROXICODONE) 5 MG immediate release tablet Take 1 tablet (5 mg total) by mouth every 6 (six) hours as needed for moderate pain. (Patient not taking: Reported on 04/25/2018) 15 tablet 0  . prochlorperazine (COMPAZINE) 10 MG tablet Take 1 tablet (10 mg total) by mouth every 6 (six) hours as needed (Nausea or vomiting). 30 tablet 1  . promethazine (PHENERGAN) 12.5 MG tablet Take 1  tablet (12.5 mg total) by mouth every 6 (six) hours as needed for nausea or vomiting. (Patient not taking: Reported on 04/25/2018) 10 tablet 0  . traMADol (ULTRAM) 50 MG tablet Take 2 tablets (100 mg total) by mouth every 6 (six) hours as needed. (Patient not taking: Reported on 03/24/2018) 10 tablet 0  . vitamin B-12 (CYANOCOBALAMIN) 500 MCG tablet Take 500 mcg by mouth daily.     No current facility-administered medications for this visit.    Facility-Administered Medications Ordered in Other Visits  Medication Dose Route Frequency Provider Last Rate Last Dose  . lactated ringers infusion    Continuous PRN Kerby Less, CRNA        PHYSICAL EXAMINATION: ECOG PERFORMANCE STATUS: 1 - Symptomatic but completely ambulatory  Vitals:   07/18/18 0939  BP: (!) 188/86  Pulse: 72  Resp: 18  Temp: 98.1 F (36.7 C)  SpO2: 100%   Filed Weights   07/18/18 0939  Weight: 226 lb (102.5 kg)    GENERAL:alert, no distress and comfortable SKIN: skin color, texture, turgor are normal, no rashes or significant lesions  EYES: normal, Conjunctiva are pink and non-injected, sclera clear OROPHARYNX:no exudate, no erythema and lips, buccal mucosa, and tongue normal  NECK: supple, thyroid normal size, non-tender, without nodularity LYMPH:  no palpable lymphadenopathy in the cervical, axillary or inguinal LUNGS: clear to auscultation and percussion with normal breathing effort HEART: regular rate & rhythm and no murmurs and no lower extremity edema ABDOMEN:abdomen soft, non-tender and normal bowel sounds MUSCULOSKELETAL:no cyanosis of digits and no clubbing  NEURO: alert & oriented x 3 with fluent speech, no focal motor/sensory deficits EXTREMITIES: No lower extremity edema   LABORATORY DATA:  I have reviewed the data as listed CMP Latest Ref Rng & Units 07/11/2018 06/20/2018 04/09/2018  Glucose 70 - 99 mg/dL 113(H) 106(H) 149(H)  BUN 6 - 20 mg/dL 10 13 15   Creatinine 0.44 - 1.00 mg/dL 0.94 0.88  0.97  Sodium 135 - 145 mmol/L 141 140 139  Potassium 3.5 - 5.1 mmol/L 3.5 3.6 3.3(L)  Chloride 98 - 111 mmol/L 104 104 102  CO2 22 - 32 mmol/L 28 29 28   Calcium 8.9 - 10.3 mg/dL 9.5 9.3 9.6  Total Protein 6.5 - 8.1 g/dL 7.7 7.4 7.1  Total Bilirubin 0.3 - 1.2 mg/dL 0.3 0.2(L) 0.4  Alkaline Phos 38 - 126 U/L 119 93 79  AST 15 - 41 U/L 15 15 22   ALT 0 - 44 U/L 13 13 20     Lab Results  Component Value Date   WBC 3.6 (L) 07/18/2018   HGB 11.5 (L) 07/18/2018   HCT 35.3 07/18/2018   MCV 79.3 (L) 07/18/2018   PLT 184 07/18/2018   NEUTROABS 2.6 07/18/2018    ASSESSMENT & PLAN:  Breast cancer, IDC, Left, Stage III, Triple negative Prior left breast cancer 2012: Treated with neoadjuvant FEC-Taxotere status post lumpectomy radiation. Recurrence: Right axillary lymph node. Neoadjuvant chemo carboplatin and gemcitabine x6 cycles Right axillary lymph node dissection 1/11 lymph nodes positive Adjuvant radiation with Xeloda completed 06/19/18  Current treatment: Cycle 1 day 8 CMF adj chemo Labs reviewed: No evidence of neutropenia.  We will maintain the same dosage. Chemo Toxicities: 1.  Headache 2. mild nausea  RTC in 2 weeks for cycle 2    No orders of the defined types were placed in this encounter.  The patient has a good understanding of the overall plan. she agrees with it. she will call with any problems that may develop before the next visit here.   Harriette Ohara, MD 07/18/18

## 2018-07-21 ENCOUNTER — Ambulatory Visit
Admission: RE | Admit: 2018-07-21 | Discharge: 2018-07-21 | Disposition: A | Discharge status: Home / Self Care | Payer: BLUE CROSS/BLUE SHIELD | Source: Ambulatory Visit | Attending: Radiation Oncology | Admitting: Radiation Oncology

## 2018-07-21 ENCOUNTER — Encounter: Payer: Self-pay | Admitting: Radiation Oncology

## 2018-07-21 ENCOUNTER — Other Ambulatory Visit: Payer: Self-pay

## 2018-07-21 VITALS — BP 189/99 | HR 74 | Temp 97.6°F | Ht 65.0 in | Wt 226.4 lb

## 2018-07-21 DIAGNOSIS — Z79899 Other long term (current) drug therapy: Secondary | ICD-10-CM | POA: Insufficient documentation

## 2018-07-21 DIAGNOSIS — Z923 Personal history of irradiation: Secondary | ICD-10-CM | POA: Diagnosis not present

## 2018-07-21 DIAGNOSIS — Z885 Allergy status to narcotic agent status: Secondary | ICD-10-CM | POA: Diagnosis not present

## 2018-07-21 DIAGNOSIS — C50611 Malignant neoplasm of axillary tail of right female breast: Secondary | ICD-10-CM

## 2018-07-21 DIAGNOSIS — Z171 Estrogen receptor negative status [ER-]: Secondary | ICD-10-CM | POA: Insufficient documentation

## 2018-07-21 HISTORY — DX: Personal history of irradiation: Z92.3

## 2018-07-21 NOTE — Progress Notes (Signed)
Radiation Oncology         [phone] ________________________________  Name: Jessica Simpson MRN: [id number]  Date: 07/21/2018  DOB: 23-Feb-1971  Follow-Up Visit Note  Outpatient  CC: Patient, [name]  Nicholas Lose, MD  Diagnosis and Prior Radiotherapy:    ICD-10-CM   1. 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 She received her first dose of adjuvant CMF therapy on 07/11/2018 and the next cycle is scheduled for 08/01/2018. She last saw Dr. Lindi Adie on 07/18/2018. Skin healing well in RT site. She is here with her daughter today.                             ALLERGIES:  is allergic to codeine and lortab [hydrocodone-acetaminophen].  Meds: Current Outpatient Medications  Medication Sig Dispense Refill  . acetaminophen (TYLENOL) 500 MG tablet Take 500 mg by mouth every 6 (six) hours as needed. For pain     . amLODipine (NORVASC) 5 MG tablet Take 5 mg by mouth daily.  6  . Cholecalciferol 5000 units capsule Take 1 capsule (5,000 Units total) by mouth daily.    Marland Kitchen ibuprofen (ADVIL,MOTRIN) 400 MG tablet Take 400 mg by mouth every 8 (eight) hours as needed.    . lidocaine-prilocaine (EMLA) cream Apply to affected area once 30 g 3  . vitamin B-12 (CYANOCOBALAMIN) 500 MCG tablet Take 500 mcg by mouth daily.    Marland Kitchen gabapentin (NEURONTIN) 100 MG capsule Take 100 mg by mouth at bedtime.    Marland Kitchen loratadine (CLARITIN) 10 MG tablet Take 10 mg by mouth daily as  needed for allergies.     Marland Kitchen LORazepam (ATIVAN) 0.5 MG tablet Take 1 tablet (0.5 mg total) by mouth every 6 (six) hours as needed (Nausea or vomiting). (Patient not taking: Reported on 07/21/2018) 30 tablet 0  . methocarbamol (ROBAXIN) 500 MG tablet Take 1 tablet (500 mg total) by mouth every 8 (eight) hours as needed for muscle spasms. (Patient not taking: Reported on 04/25/2018) 20 tablet 0  . Multiple Vitamin (MULTIVITAMIN) tablet Take 1 tablet by mouth daily.    . ondansetron (ZOFRAN) 8 MG tablet Take 1 tablet (8 mg total) by mouth 2 (two) times daily as needed for refractory nausea / vomiting. Start on day 3 after chemotherapy. (Patient not taking: Reported on 07/21/2018) 30 tablet 1  . oxyCODONE (OXY IR/ROXICODONE) 5 MG immediate release tablet Take 1 tablet (5 mg total) by mouth every 6 (six) hours as needed for moderate pain. (Patient not taking: Reported on 04/25/2018) 15 tablet 0  . prochlorperazine (COMPAZINE) 10 MG tablet Take 1 tablet (10 mg total) by mouth every 6 (six) hours as needed (Nausea or vomiting). (Patient not taking: Reported on 07/21/2018) 30 tablet 1  . promethazine (PHENERGAN) 12.5 MG tablet Take 1 tablet (12.5 mg total) by mouth every 6 (six) hours as needed for nausea or vomiting. (Patient not taking: Reported on 04/25/2018) 10  tablet 0  . traMADol (ULTRAM) 50 MG tablet Take 2 tablets (100 mg total) by mouth every 6 (six) hours as needed. (Patient not taking: Reported on 03/24/2018) 10 tablet 0   No current facility-administered medications for this encounter.    Facility-Administered Medications Ordered in Other Encounters  Medication Dose Route Frequency Provider Last Rate Last Dose  . lactated ringers infusion    Continuous PRN Kerby Less, CRNA        Physical Findings: The patient is in no acute distress. Patient is alert and oriented.  height is 5' 5"  (1.651 m) and weight is 226 lb 6.4 oz (102.7 kg). Her oral temperature is 97.6 F (36.4 C). Her blood pressure is  189/99 (abnormal) and her pulse is 74. Her oxygen saturation is 100%. .    Satisfactory skin healing in radiotherapy fields over right breast/ low neck/ right upper back   Lab Findings: Lab Results  Component Value Date   WBC 3.6 (L) 07/18/2018   HGB 11.5 (L) 07/18/2018   HCT 35.3 07/18/2018   MCV 79.3 (L) 07/18/2018   PLT 184 07/18/2018    Radiographic Findings: No results found.  Impression/Plan: Healing well from radiotherapy to the breast tissue.   Continue skin care with topical Vitamin E Oil and / or lotion for at least 2 more months for further healing.  I encouraged her to continue with yearly mammography and followup with medical oncology for CMF treatments. I will see her back on an as-needed basis. I have encouraged her to call if she has any issues or concerns in the future. I wished her the very best.  Her daughter is interested in a medical career and she is applying through [facility] for clearance to shadow me in our clinic.  I look forward to mentoring her in October.   _____________________________________   Eppie Gibson, MD   This document serves as a record of services personally performed by Eppie Gibson, MD. It was created on her behalf by Arlyce Harman, a trained medical scribe. The creation of this record is based on the scribe's personal observations and the provider's statements to them. This document has been checked and approved by the attending provider.

## 2018-07-24 ENCOUNTER — Encounter: Payer: Self-pay | Admitting: Radiation Oncology

## 2018-07-27 ENCOUNTER — Ambulatory Visit: Payer: BLUE CROSS/BLUE SHIELD | Attending: Hematology and Oncology | Admitting: Rehabilitation

## 2018-07-27 ENCOUNTER — Other Ambulatory Visit: Payer: Self-pay

## 2018-07-27 ENCOUNTER — Encounter: Payer: Self-pay | Admitting: Rehabilitation

## 2018-07-27 DIAGNOSIS — M25612 Stiffness of left shoulder, not elsewhere classified: Secondary | ICD-10-CM

## 2018-07-27 DIAGNOSIS — I890 Lymphedema, not elsewhere classified: Secondary | ICD-10-CM

## 2018-07-27 NOTE — Therapy (Signed)
Jessica Simpson, [place], [address] Phone: [phone]   Fax:  [phone]  Physical Therapy Evaluation  Patient Details  Name: Jessica Simpson MRN: [id number] Date of Birth: Apr 21, 1971 Referring Provider (PT): Dr. Lindi Simpson   Encounter Date: 07/27/2018  PT End of Session - 07/27/18 1716    Visit Number  1    Number of Visits  6    Date for PT Re-Evaluation  09/07/18    PT Start Time  7096    PT Stop Time  1510    PT Time Calculation (min)  39 min    Activity Tolerance  Patient tolerated treatment well    Behavior During Therapy  [facility] for tasks assessed/performed       Past Medical History:  Diagnosis Date  . BRCA1 negative 10/22/2011  . BRCA2 negative 10/22/2011  . Breast cancer, IDC, Left, Stage III, Triple negative 06/30/2011  . Contraceptive education 01/15/2016  . Family history of breast cancer   . Fracture    right 4th toe  . History of breast cancer 07/22/2014  . History of radiation therapy 05/16/18- 06/22/18   Right Breast/ 50.4 Gy in 28 fractions. Right posterior axilla and SCV nodes/ 50.4 Gy in 28 fractions.   . Hypertension   . Lymphedema 06/15/2012  . Lymphedema of arm   . Personal history of chemotherapy   . Personal history of radiation therapy   . Positive fecal occult blood test 07/22/2014  . S/P radiation therapy 2013   50 gray in 25 fractions to the left breast, supraclavicular, and axillary regions. She then received a boost to the left lumpectomy of 10 gray in 5 fractions. This was given at [place].  . Status post chemotherapy Comp. 11/23/11   FEC and Taxotere    Past Surgical History:  Procedure Laterality Date  . 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 AXILLARY LYMPH NODE BIOPSY Right 04/06/2018  . AXILLARY LYMPH NODE DISSECTION Right 04/06/2018   Procedure: RIGHT AXILLARY LYMPH NODE DISSECTION;  Surgeon: Rolm Bookbinder, MD;  Location: [place];  Service: General;  Laterality: Right;  . BREAST LUMPECTOMY    . BREAST SURGERY    . CESAREAN SECTION    . COLONOSCOPY N/A 08/14/2014   Procedure: COLONOSCOPY;  Surgeon: Rogene Houston, MD;  [facility];  Service: Endoscopy;  Laterality: N/A;  930  . EVACUATION BREAST HEMATOMA  12/21/2011   Procedure: EVACUATION HEMATOMA BREAST;  Surgeon: Stark Klein, MD;  Location: [place];  Service: General;  Laterality: Left;  . HAND SURGERY     tumor on finger, left hand  . INCISION AND DRAINAGE ABSCESS ANAL     x3  . left breast biopsy with axillary biopsy     core bx  . PORT-A-CATH REMOVAL  12/21/2011   Procedure: REMOVAL PORT-A-CATH;  Surgeon: Haywood Lasso, MD;  Location: [place];  Service: General;  Laterality: N/A;  . PORTACATH PLACEMENT  08/02/2011   dr Margot Chimes  . PORTACATH PLACEMENT Right 11/14/2017   Procedure: INSERTION PORT-A-CATH WITH [place];  Surgeon: Rolm Bookbinder, MD;  Location: [place];  Service: General;  Laterality: Right;  . removal breast mass  2004   benign  . TREATMENT FISTULA ANAL    . TUMOR REMOVAL     on left hand  . TUMOR REMOVAL     rt. eye    There were  no vitals filed for this visit.   Subjective Assessment - 07/27/18 1433    Subjective  Presents today with complaints of swelling in the Lt UE.  The middle finger started swelling more.  Had therapy at [facility] for lymphedema no bandaging but MLD.  Got a compression garment and glove but doesn't wear it much.  Sometimes it goes away at night     Pertinent History  Breast cancer Lt treated in 2012-2013 with chemotherapy/taxotere post lumpectomy and SLNB 3/7 nodes and radiation with onset of lymphedema during radiation, treatment of lymphedema at [facility] in 2013.  Reoccurence in Rt axillary lymph node with chemotherapy ongoing carboplatin and gemcitabine x 6 cycles and ALND 1/11 positive, radiation completed 06/19/18 on the Rt.  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  No/denies         [facility] PT Assessment - 07/27/18 0001      Assessment   Medical Diagnosis  lymphedema Lt UE    Referring Provider (PT)  Dr. Lindi Simpson    Onset Date/Surgical Date  01/24/12    Hand Dominance  Right    Next MD Visit  for Chemo    Prior Therapy  yes [facility]      Precautions   Precaution Comments  lymphedema bilateral, cancer      Restrictions   Weight Bearing Restrictions  No      Balance Screen   Has the patient fallen in the past 6 months  No    Has the patient had a decrease in activity level because of a fear of falling?   No    Is the patient reluctant to leave their home because of a fear of falling?   No      Home Film/video editor residence      Prior Function   Level of Independence  Independent    Vocation  Full time employment   off work until treatment is over   [employer] job    Leisure  nothing reported      Cognition   Overall Cognitive Status  Within Functional Limits for tasks assessed      Observation/Other Assessments   Observations  no sig size difference in seated of the Lt UE      Coordination   Gross Motor Movements are Fluid and Coordinated  Yes      ROM / Strength   AROM / PROM / Strength  AROM;PROM;Strength      AROM   AROM Assessment Site  Shoulder    Right/Left Shoulder  Right;Left    Right Shoulder Horizontal ABduction  40 Degrees    Left Shoulder Flexion  170 Degrees    Left Shoulder ABduction  170 Degrees    Left Shoulder Internal Rotation  80 Degrees    Left Shoulder External Rotation  90 Degrees    Left Shoulder Horizontal ABduction  30 Degrees      PROM   PROM Assessment Site  Shoulder    Right/Left Shoulder  Right;Left      Strength   Overall Strength Comments   bilateral UE WNL        LYMPHEDEMA/ONCOLOGY QUESTIONNAIRE - 07/27/18 1429  Type   Cancer Type  bilateral breast      Surgeries   Lumpectomy Date  12/21/11    Sentinel Lymph Node Biopsy Date  12/21/11   3/7 nodes on the Lt   Axillary Lymph Node Dissection Date  04/06/18   Rt side 1/11 with reoccurrence on the Rt but not breast tiss     Date Lymphedema/Swelling Started   Date  01/24/12      Treatment   Active Chemotherapy Treatment  Yes    Date  07/11/18    Past Chemotherapy Treatment  Yes    Date  09/14/11    Active Radiation Treatment  No    Past Radiation Treatment  Yes    Body Site  bilateral breast    Current Hormone Treatment  No    Past Hormone Therapy  No      What other symptoms do you have   Are you Having Heaviness or Tightness  Yes    Are you having Pain  No      Lymphedema Assessments   Lymphedema Assessments  Upper extremities      Right Upper Extremity Lymphedema   10 cm Proximal to Olecranon Process  37.2 cm    Olecranon Process  31 cm    10 cm Proximal to Ulnar Styloid Process  26 cm    Just Proximal to Ulnar Styloid Process  19.7 cm    Across Hand at [employer]  23.3 cm    At [place] of 2nd Digit  7.1 cm      Left Upper Extremity Lymphedema   10 cm Proximal to Olecranon Process  35.8 cm    Olecranon Process  30.1 cm    10 cm Proximal to Ulnar Styloid Process  25.4 cm    Just Proximal to Ulnar Styloid Process  21.2 cm    Across Hand at [employer]  23.5 cm    At [place] of 2nd Digit  7 cm             Objective measurements completed on examination: See above findings.              PT Education - 07/27/18 1716    Education Details  POC    Person(s) Educated  Patient    Methods  Explanation    Comprehension  Verbalized understanding          PT Long Term Goals - 07/27/18 1724      PT LONG TERM GOAL #1   Title  Pt will be knowledgeable about when to wear compression to stop lymphedema progression    Time  6     Period  Weeks    Status  New    Target Date  09/07/18      PT LONG TERM GOAL #2   Title  Pt will obtain new compression sleeve and glove for lymphedema     Time  6    Period  Weeks    Status  New    Target Date  09/07/18      PT LONG TERM GOAL #3   Title  Pt will be ind with self MLD or pump use for the Lt UE    Time  6    Period  Weeks    Status  New    Target Date  09/07/18      PT LONG TERM GOAL #4   Title  Pt be ind with self stretches for pectoralis/shoulders  Time  6    Period  Weeks    Status  New    Target Date  09/07/18             Plan - 07/27/18 1711    Clinical Impression Statement  Beena presents today with continued Lt UE lymphedema after treatments for breast cancer in 2012-2013.  She had onset during radiation and has been able to control it wearing a Juzo circular knit sleeve and juzo flat knit glove.  The glove is 47 years old and the sleeve about 79 months old per patient.  She will notice intermittent swelling in the hand with activity and for no reason.  The size of the UE is not significantly different from the Rt side except for near the ulnar styloid process.  her shoulder ROM is WNL except for horizontal abduction by about 10 degrees.  She will benefit from a few sessions of MLD with re-education of MLD, new compression garments, and pt is interested in a pump to do everything she can.      History and Personal Factors relevant to plan of care:  Breast cancer Lt treated in 2012-2013 with chemotherapy/taxotere post lumpectomy and SLNB 3/7 nodes and radiation with onset of lymphedema during radiation, treatment of lymphedema at [facility] in 2013. Reoccurence in Rt axillary lymph node with chemotherapy ongoing carboplatin and gemcitabine x 6 cycles and ALND 1/11 positive, radiation completed 06/19/18 on the Rt. 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 the hands, does experience fatigue     Clinical Presentation  Evolving    Clinical  Presentation due to:  chemo treatment status for Rt axillary reoccurrence, on and off swelling     Clinical Decision Making  Moderate    Rehab Potential  Excellent    PT Frequency  1x / week    PT Duration  6 weeks    PT Treatment/Interventions  ADLs/Self Care Home Management;Therapeutic exercise;Taping;Manual lymph drainage;Compression bandaging;DME Instruction    PT Next Visit Plan  Begin Lt UE MLD, talk with pt about setting up an appt for new compression/ideas, eventually teach MLD, show pectoralis stretches    Consulted and Agree with Plan of Care  Patient       Patient will benefit from skilled therapeutic intervention in order to improve the following deficits and impairments:  Decreased range of motion, Decreased knowledge of precautions, Increased edema  Visit Diagnosis: Lymphedema, not elsewhere classified  Stiffness of left shoulder, not elsewhere classified     Problem List Patient Active Problem List   Diagnosis Date Noted  . 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 ([place]) 04/25/2018  . Breast cancer, right ([place]) 04/06/2018  . Genetic testing 01/12/2018  . Family history of breast cancer   . Port-A-Cath in place 11/21/2017  . Mass of axillary tail of right breast 08/24/2017  . Pain with urination 08/24/2017  . Urinary frequency 08/24/2017  . Hematuria 08/24/2017  . Encounter for well woman exam with routine gynecological exam 01/15/2016  . Benign cyst of right breast 11/29/2015  . Positive fecal occult blood test 07/22/2014  . History of breast cancer 07/22/2014  . Rectal bleeding 07/10/2014  . Lymphedema 06/15/2012  . BRCA1 negative 10/22/2011  . BRCA2 negative 10/22/2011  . Breast cancer, IDC, Left, Stage III, Triple negative 06/30/2011    Class: Diagnosis of    Jessica Simpson, PT 07/27/2018, 5:26 PM  [place], [place], [address]  Phone: [phone]   Fax:   [phone]  Name: Jessica Simpson MRN: [id number] Date of Birth: 11-27-1970

## 2018-08-01 ENCOUNTER — Inpatient Hospital Stay: Payer: BLUE CROSS/BLUE SHIELD

## 2018-08-01 ENCOUNTER — Inpatient Hospital Stay: Payer: BLUE CROSS/BLUE SHIELD | Attending: Hematology and Oncology

## 2018-08-01 ENCOUNTER — Inpatient Hospital Stay (HOSPITAL_BASED_OUTPATIENT_CLINIC_OR_DEPARTMENT_OTHER): Payer: BLUE CROSS/BLUE SHIELD | Admitting: Hematology and Oncology

## 2018-08-01 VITALS — BP 160/87 | HR 77

## 2018-08-01 DIAGNOSIS — Z95828 Presence of other vascular implants and grafts: Secondary | ICD-10-CM

## 2018-08-01 DIAGNOSIS — Z5111 Encounter for antineoplastic chemotherapy: Secondary | ICD-10-CM | POA: Diagnosis not present

## 2018-08-01 DIAGNOSIS — C50012 Malignant neoplasm of nipple and areola, left female breast: Secondary | ICD-10-CM

## 2018-08-01 DIAGNOSIS — Z171 Estrogen receptor negative status [ER-]: Secondary | ICD-10-CM | POA: Insufficient documentation

## 2018-08-01 DIAGNOSIS — N6331 Unspecified lump in axillary tail of the right breast: Secondary | ICD-10-CM

## 2018-08-01 DIAGNOSIS — R5383 Other fatigue: Secondary | ICD-10-CM | POA: Insufficient documentation

## 2018-08-01 DIAGNOSIS — R110 Nausea: Secondary | ICD-10-CM | POA: Insufficient documentation

## 2018-08-01 DIAGNOSIS — Z79899 Other long term (current) drug therapy: Secondary | ICD-10-CM | POA: Diagnosis not present

## 2018-08-01 DIAGNOSIS — R51 Headache: Secondary | ICD-10-CM | POA: Insufficient documentation

## 2018-08-01 DIAGNOSIS — C7989 Secondary malignant neoplasm of other specified sites: Secondary | ICD-10-CM | POA: Diagnosis not present

## 2018-08-01 DIAGNOSIS — C50611 Malignant neoplasm of axillary tail of right female breast: Secondary | ICD-10-CM

## 2018-08-01 DIAGNOSIS — Z923 Personal history of irradiation: Secondary | ICD-10-CM

## 2018-08-01 DIAGNOSIS — C171 Malignant neoplasm of jejunum: Secondary | ICD-10-CM

## 2018-08-01 DIAGNOSIS — D709 Neutropenia, unspecified: Secondary | ICD-10-CM | POA: Insufficient documentation

## 2018-08-01 LAB — CBC WITH DIFFERENTIAL (CANCER CENTER ONLY)
ABS IMMATURE GRANULOCYTES: 0.03 10*3/uL (ref 0.00–0.07)
Basophils Absolute: 0 10*3/uL (ref 0.0–0.1)
Basophils Relative: 1 %
Eosinophils Absolute: 0.1 10*3/uL (ref 0.0–0.5)
Eosinophils Relative: 2 %
HCT: 35.3 % — ABNORMAL LOW (ref 36.0–46.0)
HEMOGLOBIN: 11.4 g/dL — AB (ref 12.0–15.0)
IMMATURE GRANULOCYTES: 1 %
LYMPHS PCT: 16 %
Lymphs Abs: 0.7 10*3/uL (ref 0.7–4.0)
MCH: 26 pg (ref 26.0–34.0)
MCHC: 32.3 g/dL (ref 30.0–36.0)
MCV: 80.6 fL (ref 80.0–100.0)
MONO ABS: 0.8 10*3/uL (ref 0.1–1.0)
Monocytes Relative: 18 %
NEUTROS ABS: 2.7 10*3/uL (ref 1.7–7.7)
NEUTROS PCT: 62 %
PLATELETS: 213 10*3/uL (ref 150–400)
RBC: 4.38 MIL/uL (ref 3.87–5.11)
RDW: 17.9 % — ABNORMAL HIGH (ref 11.5–15.5)
WBC: 4.2 10*3/uL (ref 4.0–10.5)
nRBC: 0 % (ref 0.0–0.2)

## 2018-08-01 LAB — CMP (CANCER CENTER ONLY)
ALBUMIN: 3.6 g/dL (ref 3.5–5.0)
ALK PHOS: 112 U/L (ref 38–126)
ALT: 17 U/L (ref 0–44)
AST: 15 U/L (ref 15–41)
Anion gap: 9 (ref 5–15)
BUN: 15 mg/dL (ref 6–20)
CO2: 28 mmol/L (ref 22–32)
CREATININE: 0.93 mg/dL (ref 0.44–1.00)
Calcium: 9.5 mg/dL (ref 8.9–10.3)
Chloride: 104 mmol/L (ref 98–111)
GFR, Estimated: >60 mL/min (ref >60)
GLUCOSE: 109 mg/dL — AB (ref 70–99)
Potassium: 3.9 mmol/L (ref 3.5–5.1)
SODIUM: 141 mmol/L (ref 135–145)
Total Bilirubin: 0.3 mg/dL (ref 0.3–1.2)
Total Protein: 7.4 g/dL (ref 6.5–8.1)

## 2018-08-01 MED ORDER — PALONOSETRON HCL INJECTION 0.25 MG/5ML
0.2500 mg | Freq: Once | INTRAVENOUS | Status: AC
  Administered 2018-08-01: 0.25 mg via INTRAVENOUS

## 2018-08-01 MED ORDER — DEXAMETHASONE SODIUM PHOSPHATE 10 MG/ML IJ SOLN
INTRAMUSCULAR | Status: AC
  Filled 2018-08-01: qty 1

## 2018-08-01 MED ORDER — SODIUM CHLORIDE 0.9 % IV SOLN
Freq: Once | INTRAVENOUS | Status: AC
  Administered 2018-08-01: 13:00:00 via INTRAVENOUS
  Filled 2018-08-01: qty 250

## 2018-08-01 MED ORDER — SODIUM CHLORIDE 0.9% FLUSH
10.0000 mL | INTRAVENOUS | Status: DC | PRN
  Administered 2018-08-01: 10 mL
  Filled 2018-08-01: qty 10

## 2018-08-01 MED ORDER — PALONOSETRON HCL INJECTION 0.25 MG/5ML
INTRAVENOUS | Status: AC
  Filled 2018-08-01: qty 5

## 2018-08-01 MED ORDER — METHOTREXATE SODIUM (PF) CHEMO INJECTION 250 MG/10ML
39.9000 mg/m2 | Freq: Once | INTRAMUSCULAR | Status: AC
  Administered 2018-08-01: 87 mg via INTRAVENOUS
  Filled 2018-08-01: qty 3.48

## 2018-08-01 MED ORDER — SODIUM CHLORIDE 0.9 % IV SOLN
600.0000 mg/m2 | Freq: Once | INTRAVENOUS | Status: AC
  Administered 2018-08-01: 1300 mg via INTRAVENOUS
  Filled 2018-08-01: qty 65

## 2018-08-01 MED ORDER — HEPARIN SOD (PORK) LOCK FLUSH 100 UNIT/ML IV SOLN
500.0000 [IU] | Freq: Once | INTRAVENOUS | Status: AC | PRN
  Administered 2018-08-01: 500 [IU]
  Filled 2018-08-01: qty 5

## 2018-08-01 MED ORDER — SODIUM CHLORIDE 0.9% FLUSH
10.0000 mL | Freq: Once | INTRAVENOUS | Status: AC
  Administered 2018-08-01: 10 mL
  Filled 2018-08-01: qty 10

## 2018-08-01 MED ORDER — DEXAMETHASONE SODIUM PHOSPHATE 10 MG/ML IJ SOLN
10.0000 mg | Freq: Once | INTRAMUSCULAR | Status: AC
  Administered 2018-08-01: 10 mg via INTRAVENOUS

## 2018-08-01 MED ORDER — FLUOROURACIL CHEMO INJECTION 2.5 GM/50ML
600.0000 mg/m2 | Freq: Once | INTRAVENOUS | Status: AC
  Administered 2018-08-01: 1300 mg via INTRAVENOUS
  Filled 2018-08-01: qty 26

## 2018-08-01 NOTE — Patient Instructions (Signed)
[facility] Discharge Instructions for Patients Receiving Chemotherapy  Today you received the following chemotherapy agents: Cyclophosphamide (Cytoxan), Methotrexate (PF), and Fluorouracil (Adrucil, 5-FU)  To help prevent nausea and vomiting after your treatment, we encourage you to take your nausea medication as directed.    If you develop nausea and vomiting that is not controlled by your nausea medication, call the clinic.   BELOW ARE SYMPTOMS THAT SHOULD BE REPORTED IMMEDIATELY:  *FEVER GREATER THAN 100.5 F  *CHILLS WITH OR WITHOUT FEVER  NAUSEA AND VOMITING THAT IS NOT CONTROLLED WITH YOUR NAUSEA MEDICATION  *UNUSUAL SHORTNESS OF BREATH  *UNUSUAL BRUISING OR BLEEDING  TENDERNESS IN MOUTH AND THROAT WITH OR WITHOUT PRESENCE OF ULCERS  *URINARY PROBLEMS  *BOWEL PROBLEMS  UNUSUAL RASH Items with * indicate a potential emergency and should be followed up as soon as possible.  Feel free to call the clinic should you have any questions or concerns. The clinic phone number is [phone].  Please show the [place] at check-in to the Emergency Department and triage nurse.

## 2018-08-01 NOTE — Progress Notes (Signed)
I spoke with patient down in infusion today about her elevated blood pressure. She had known that her blood pressures have been elevated when she comes to the cancer center for treatments. She was concerned about this so she talked with her PCP about it. Her PCP told her to double up on her amlodipine on days of treatment since her blood pressures at home and at her PCP office were normal. Dr. Lindi Adie was okay with this plan as well. The patient will do this from now on and monitor her BP carefully at home.   I talked with her about possibly needing to increase her amlodipine dose daily depending on her home blood pressures, but she will talk with her PCP about this.   Regarding her headaches, she will let us know if they improve with increasing her blood pressure medication for next treatment. Another possibility could be changing Aloxi to a different anti-emetic if they persist - she may talk with Dr. Lindi Adie about this in the future.   Demetrius Charity, PharmD, [facility] Phone: [phone] 08/01/2018

## 2018-08-01 NOTE — Assessment & Plan Note (Signed)
Prior left breast cancer 2012: Treated with neoadjuvant FEC-Taxotere status post lumpectomy radiation. Recurrence: Right axillary lymph node. Neoadjuvant chemo carboplatin and gemcitabine x6 cycles Right axillary lymph node dissection 1/11 lymph nodes positive Adjuvant radiation with Xelodacompleted 06/19/18  Current treatment:Cycle 2 day 1 CMF adj chemo Labs reviewed: No evidence of neutropenia.  We will maintain the same dosage. Chemo Toxicities: 1.  Headache: Persistent.  I discussed with her that if the headaches continue then we may have to scan her head. 2. mild nausea: Did not require any nausea medication at home.  Labs have been reviewed Monitoring closely for chemotherapy side effects.  RTC in 3 weeks for cycle 3

## 2018-08-01 NOTE — Progress Notes (Signed)
Patient Care Team: Patient, [name] as PCP - General ([place]) Neldon Mc, MD as Surgeon (General Surgery) Everardo All, MD (Hematology and Oncology)  DIAGNOSIS:  Encounter Diagnosis  Name Primary?  . 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, estrogen receptor negative ([place])     SUMMARY OF ONCOLOGIC HISTORY:   Cancer of left breast ([place])   06/30/2011 Initial Diagnosis    Breast cancer, IDC, Left, Stage III, Triple negative, 7.6 cm breast mass and palpable axillary mass    09/06/2011 Miscellaneous    BRCA 1 and 2: Negative    09/14/2011 - 11/23/2011 Neo-Adjuvant Chemotherapy    Dose dense FEC followed by dose dense Taxotere    12/21/2011 Surgery    Left lumpectomy: High-grade poorly differentiated IDC 1.7 cm with high-grade DCIS, margins negative, 3/7 lymph nodes positive, ER 0%, PR 0%, HER-2 negative ratio 1.33 T1CN1 stage IIb    12/31/2011 - 02/15/2012 Radiation Therapy    Radiation at [facility]    09/26/2017 Relapse/Recurrence    Left breast upper outer quadrant within the lumpectomy bed: Fibrosis no malignancy, right axillary lymph node biopsy metastatic high-grade carcinoma ER 0%, PR 0%, Ki-67 90%, HER-2 negative ratio 1.11 (similar to previous ductal carcinoma)    11/14/2017 - 03/06/2018 Neo-Adjuvant Chemotherapy    Neo-Adjuvant chemotherapy with Gemzar and carboplatin days 1 and 8 every 3 weeks    01/10/2018 Genetic Testing    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  The Hereditary Gene Panel offered by [facility] includes sequencing and/or deletion duplication testing of the following 47 genes: APC, ATM, AXIN2, BARD1, BMPR1A, BRCA1, BRCA2, BRIP1, CDH1, CDK4, CDKN2A (p14ARF), CDKN2A (p16INK4a), CHEK2, CTNNA1, DICER1, EPCAM (Deletion/duplication testing only), GREM1 (promoter region deletion/duplication testing only), KIT, MEN1, MLH1, MSH2, MSH3, MSH6, MUTYH, NBN, NF1, NHTL1, PALB2, PDGFRA, PMS2, POLD1,  POLE, PTEN, RAD50, RAD51C, RAD51D, SDHB, SDHC, SDHD, SMAD4, SMARCA4. STK11, TP53, TSC1, TSC2, and VHL.  The following genes were evaluated for sequence changes only: SDHA and HOXB13 c.251G>A variant only. The report date is January 10, 2018.      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 ([place])   04/06/2018 Surgery    Right axillary lymph node dissection: 1/11 node positive for metastatic high-grade carcinoma    04/25/2018 Initial Diagnosis    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 ([place])    04/25/2018 Cancer Staging    Staging form: Breast, AJCC 8th Edition - Clinical: Stage IIB (cT0, cN1, cM0, G3, ER-, PR-, HER2-) - Signed by Eppie Gibson, MD on 04/25/2018    05/16/2018 - 06/22/2018 Radiation Therapy    Adjuvant radiation therapy with Xeloda    07/11/2018 -  Chemotherapy    Adjuvant chemotherapy with CMF      CHIEF COMPLIANT: Cycle 2 CMF  INTERVAL HISTORY: Jessica Simpson is a 47 year old with above-mentioned history of initial left breast cancer now right breast cancer with the recurrence for which she is on adjuvant chemotherapy with CMF.  Today is cycle 2 of treatment.  She reports to have intermittent headaches that have been going on since cycle 1.  She denies any nausea vomiting.  REVIEW OF SYSTEMS:   Constitutional: Denies fevers, chills or abnormal weight loss Eyes: Denies blurriness of vision Ears, nose, mouth, throat, and face: Denies mucositis or sore throat Respiratory: Denies cough, dyspnea or wheezes Cardiovascular: Denies palpitation, chest discomfort Gastrointestinal:  Denies nausea, heartburn or change in bowel habits Skin: Denies abnormal skin  rashes Lymphatics: Denies new lymphadenopathy or easy bruising Neurological:Denies numbness, tingling or new weaknesses Behavioral/Psych: Mood is stable, no new changes  Extremities: No lower extremity edema   All other systems were reviewed with the patient and are negative.  I have reviewed the past medical  history, past surgical history, social history and family history with the patient and they are unchanged from previous note.  ALLERGIES:  is allergic to codeine and lortab [hydrocodone-acetaminophen].  MEDICATIONS:  Current Outpatient Medications  Medication Sig Dispense Refill  . acetaminophen (TYLENOL) 500 MG tablet Take 500 mg by mouth every 6 (six) hours as needed. For pain     . amLODipine (NORVASC) 5 MG tablet Take 5 mg by mouth daily.  6  . Cholecalciferol 5000 units capsule Take 1 capsule (5,000 Units total) by mouth daily.    Marland Kitchen gabapentin (NEURONTIN) 100 MG capsule Take 100 mg by mouth at bedtime.    Marland Kitchen ibuprofen (ADVIL,MOTRIN) 400 MG tablet Take 400 mg by mouth every 8 (eight) hours as needed.    . lidocaine-prilocaine (EMLA) cream Apply to affected area once 30 g 3  . loratadine (CLARITIN) 10 MG tablet Take 10 mg by mouth daily as needed for allergies.     Marland Kitchen LORazepam (ATIVAN) 0.5 MG tablet Take 1 tablet (0.5 mg total) by mouth every 6 (six) hours as needed (Nausea or vomiting). 30 tablet 0  . methocarbamol (ROBAXIN) 500 MG tablet Take 1 tablet (500 mg total) by mouth every 8 (eight) hours as needed for muscle spasms. 20 tablet 0  . Multiple Vitamin (MULTIVITAMIN) tablet Take 1 tablet by mouth daily.    . ondansetron (ZOFRAN) 8 MG tablet Take 1 tablet (8 mg total) by mouth 2 (two) times daily as needed for refractory nausea / vomiting. Start on day 3 after chemotherapy. 30 tablet 1  . oxyCODONE (OXY IR/ROXICODONE) 5 MG immediate release tablet Take 1 tablet (5 mg total) by mouth every 6 (six) hours as needed for moderate pain. 15 tablet 0  . prochlorperazine (COMPAZINE) 10 MG tablet Take 1 tablet (10 mg total) by mouth every 6 (six) hours as needed (Nausea or vomiting). 30 tablet 1  . promethazine (PHENERGAN) 12.5 MG tablet Take 1 tablet (12.5 mg total) by mouth every 6 (six) hours as needed for nausea or vomiting. 10 tablet 0  . traMADol (ULTRAM) 50 MG tablet Take 2 tablets (100 mg  total) by mouth every 6 (six) hours as needed. 10 tablet 0  . vitamin B-12 (CYANOCOBALAMIN) 500 MCG tablet Take 500 mcg by mouth daily.     No current facility-administered medications for this visit.    Facility-Administered Medications Ordered in Other Visits  Medication Dose Route Frequency Provider Last Rate Last Dose  . lactated ringers infusion    Continuous PRN Kerby Less, CRNA        PHYSICAL EXAMINATION: ECOG PERFORMANCE STATUS: 1 - Symptomatic but completely ambulatory  Vitals:   08/01/18 1158  BP: (!) 173/100  Pulse: 89  Resp: 17  Temp: 98 F (36.7 C)  SpO2: 99%   Filed Weights   08/01/18 1158  Weight: 231 lb 8 oz (105 kg)    GENERAL:alert, no distress and comfortable SKIN: skin color, texture, turgor are normal, no rashes or significant lesions EYES: normal, Conjunctiva are pink and non-injected, sclera clear OROPHARYNX:no exudate, no erythema and lips, buccal mucosa, and tongue normal  NECK: supple, thyroid normal size, non-tender, without nodularity LYMPH:  no palpable lymphadenopathy in the cervical, axillary  or inguinal LUNGS: clear to auscultation and percussion with normal breathing effort HEART: regular rate & rhythm and no murmurs and no lower extremity edema ABDOMEN:abdomen soft, non-tender and normal bowel sounds MUSCULOSKELETAL:no cyanosis of digits and no clubbing  NEURO: alert & oriented x 3 with fluent speech, no focal motor/sensory deficits EXTREMITIES: No lower extremity edema   LABORATORY DATA:  I have reviewed the data as listed CMP Latest Ref Rng & Units 07/18/2018 07/11/2018 06/20/2018  Glucose 70 - 99 mg/dL 113(H) 113(H) 106(H)  BUN 6 - 20 mg/dL _0 Creatinine 0.44 - 1.00 mg/dL 0.85 0.94 0.88  Sodium 135 - 145 mmol/L 136 141 140  Potassium 3.5 - 5.1 mmol/L 3.5 3.5 3.6  Chloride 98 - 111 mmol/L 102 104 104  CO2 22 - 32 mmol/L _1 Calcium 8.9 - 10.3 mg/dL 9.2 9.5 9.3  Total Protein 6.5 - 8.1 g/dL 7.4 7.7 7.4  Total  Bilirubin 0.3 - 1.2 mg/dL 0.3 0.3 0.2(L)  Alkaline Phos 38 - 126 U/L 98 119 93  AST 15 - 41 U/L 11(L) 15 15  ALT 0 - 44 U/L _2 Lab Results  Component Value Date   WBC 4.2 08/01/2018   HGB 11.4 (L) 08/01/2018   HCT 35.3 (L) 08/01/2018   MCV 80.6 08/01/2018   PLT 213 08/01/2018   NEUTROABS 2.7 08/01/2018    ASSESSMENT & PLAN:  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 ([place]) Prior left breast cancer 2012: Treated with neoadjuvant FEC-Taxotere status post lumpectomy radiation. Recurrence: Right axillary lymph node. Neoadjuvant chemo carboplatin and gemcitabine x6 cycles Right axillary lymph node dissection 1/11 lymph nodes positive Adjuvant radiation with Xelodacompleted 06/19/18  Current treatment:Cycle 2 day  1 CMF adj chemo Labs reviewed: No evidence of neutropenia.  We will maintain the same dosage. Chemo Toxicities: 1.  Headache: Persistent.  I discussed with her that if the headaches continue then we may have to scan her head. 2. mild nausea: Did not require any nausea medication at home.  Labs have been reviewed Monitoring closely for chemotherapy side effects.  RTC in 3 weeks for cycle 3    No orders of the defined types were placed in this encounter.  The patient has a good understanding of the overall plan. she agrees with it. she will call with any problems that may develop before the next visit here.   Harriette Ohara, MD 08/01/18

## 2018-08-07 ENCOUNTER — Ambulatory Visit: Payer: BLUE CROSS/BLUE SHIELD | Admitting: Rehabilitation

## 2018-08-08 ENCOUNTER — Ambulatory Visit: Payer: BLUE CROSS/BLUE SHIELD | Admitting: Physical Therapy

## 2018-08-08 ENCOUNTER — Encounter: Payer: Self-pay | Admitting: Physical Therapy

## 2018-08-08 DIAGNOSIS — M25612 Stiffness of left shoulder, not elsewhere classified: Secondary | ICD-10-CM

## 2018-08-08 DIAGNOSIS — I890 Lymphedema, not elsewhere classified: Secondary | ICD-10-CM | POA: Diagnosis not present

## 2018-08-08 NOTE — Therapy (Signed)
[place], [place], [address] Phone: [phone]   Fax:  [phone]  Physical Therapy Treatment  Patient Details  Name: Jessica Simpson MRN: [id number] Date of Birth: 1971-04-18 Referring Provider (PT): Dr. Lindi Adie   Encounter Date: 08/08/2018  PT End of Session - 08/08/18 1729    Visit Number  2    Number of Visits  6    Date for PT Re-Evaluation  09/07/18    PT Start Time  1300    PT Stop Time  1345    PT Time Calculation (min)  45 min    Activity Tolerance  Patient tolerated treatment well    Behavior During Therapy  [facility] for tasks assessed/performed       Past Medical History:  Diagnosis Date  . BRCA1 negative 10/22/2011  . BRCA2 negative 10/22/2011  . Breast cancer, IDC, Left, Stage III, Triple negative 06/30/2011  . Contraceptive education 01/15/2016  . Family history of breast cancer   . Fracture    right 4th toe  . History of breast cancer 07/22/2014  . History of radiation therapy 05/16/18- 06/22/18   Right Breast/ 50.4 Gy in 28 fractions. Right posterior axilla and SCV nodes/ 50.4 Gy in 28 fractions.   . Hypertension   . Lymphedema 06/15/2012  . Lymphedema of arm   . Personal history of chemotherapy   . Personal history of radiation therapy   . Positive fecal occult blood test 07/22/2014  . S/P radiation therapy 2013   50 gray in 25 fractions to the left breast, supraclavicular, and axillary regions. She then received a boost to the left lumpectomy of 10 gray in 5 fractions. This was given at [facility].  . Status post chemotherapy Comp. 11/23/11   FEC and Taxotere    Past Surgical History:  Procedure Laterality Date  . 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 AXILLARY LYMPH NODE BIOPSY Right 04/06/2018  . AXILLARY LYMPH NODE DISSECTION Right 04/06/2018   Procedure: RIGHT AXILLARY LYMPH NODE DISSECTION;  Surgeon: Rolm Bookbinder, MD;  Location: [place];  Service: General;  Laterality: Right;  . BREAST LUMPECTOMY    . BREAST SURGERY    . CESAREAN SECTION    . COLONOSCOPY N/A 08/14/2014   Procedure: COLONOSCOPY;  Surgeon: Rogene Houston, MD;  [facility];  Service: Endoscopy;  Laterality: N/A;  930  . EVACUATION BREAST HEMATOMA  12/21/2011   Procedure: EVACUATION HEMATOMA BREAST;  Surgeon: Stark Klein, MD;  Location: [place];  Service: General;  Laterality: Left;  . HAND SURGERY     tumor on finger, left hand  . INCISION AND DRAINAGE ABSCESS ANAL     x3  . left breast biopsy with axillary biopsy     core bx  . PORT-A-CATH REMOVAL  12/21/2011   Procedure: REMOVAL PORT-A-CATH;  Surgeon: Haywood Lasso, MD;  Location: [place];  Service: General;  Laterality: N/A;  . PORTACATH PLACEMENT  08/02/2011   dr Margot Chimes  . PORTACATH PLACEMENT Right 11/14/2017   Procedure: INSERTION PORT-A-CATH WITH [place];  Surgeon: Rolm Bookbinder, MD;  Location: [place];  Service: General;  Laterality: Right;  . removal breast mass  2004   benign  . TREATMENT FISTULA ANAL    . TUMOR REMOVAL     on left hand  . TUMOR REMOVAL     rt. eye    There were  no vitals filed for this visit.  Subjective Assessment - 08/08/18 1344    Subjective  Pt says she got her pump demonstration today and it went well.  She has some reduction after the treatment.  She wants to get her day and nighttime slleves and consents to bandaging today    Pertinent History  Breast cancer Lt treated in 2012-2013 with chemotherapy/taxotere post lumpectomy and SLNB 3/7 nodes and radiation with onset of lymphedema during radiation, treatment of lymphedema at [facility] in 2013.  Reoccurence in Rt axillary lymph node with chemotherapy ongoing carboplatin and gemcitabine x 6 cycles and ALND 1/11 positive, radiation completed 06/19/18 on the Rt.  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  No/denies                       [facility] Adult PT Treatment/Exercise - 08/08/18 0001      Manual Therapy   Manual Therapy  Edema management;Manual Lymphatic Drainage (MLD);Compression Bandaging    Edema Management  reviewed plan for CDT to get garments and pt agrees.  She will wear bandages as long as possible and place daytime sleeves .  and will bring back all bandages next time     Manual Lymphatic Drainage (MLD)  short neck, superficial and deep abdominals, ( no axillary nodes ) left inguinal nodes and left axillo0inguinal anastamosis, left shoulder to hand with return,     Compression Bandaging  with significant other Chris watching, thick stockinetter elastomull to fingers 1-4, artiflex and 4 short stretch bandabes                   PT Long Term Goals - 07/27/18 1724      PT LONG TERM GOAL #1   Title  Pt will be knowledgeable about when to wear compression to stop lymphedema progression    Time  6    Period  Weeks    Status  New    Target Date  09/07/18      PT LONG TERM GOAL #2   Title  Pt will obtain new compression sleeve and glove for lymphedema     Time  6    Period  Weeks    Status  New    Target Date  09/07/18      PT LONG TERM GOAL #3   Title  Pt will be ind with self MLD or pump use for the Lt UE    Time  6    Period  Weeks    Status  New    Target Date  09/07/18      PT LONG TERM GOAL #4   Title  Pt be ind with self stretches for pectoralis/shoulders     Time  6    Period  Weeks    Status  New    Target Date  09/07/18            Plan - 08/08/18 1729    Clinical Impression Statement  MLD and compression wrapping to LUE with instruction to pt and significant other for wrapping.  Pt wants to get reduction so she can get measured for compression sleeve ( day and night)  and she will need a compression sleeve for RUE also for prophylaxis Will send script     PT Frequency  2x / week    PT Duration  6 weeks  PT  Treatment/Interventions  ADLs/Self Care Home Management;Therapeutic exercise;Taping;Manual lymph drainage;Compression bandaging;DME Instruction    PT Next Visit Plan  check for script back, remeasure, arrange for garments with [facility] when pt is reduced     Consulted and Agree with Plan of Care  Patient       Patient will benefit from skilled therapeutic intervention in order to improve the following deficits and impairments:     Visit Diagnosis: Lymphedema, not elsewhere classified  Stiffness of left shoulder, not elsewhere classified     Problem List Patient Active Problem List   Diagnosis Date Noted  . 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 ([place]) 04/25/2018  . Genetic testing 01/12/2018  . Family history of breast cancer   . Port-A-Cath in place 11/21/2017  . Mass of axillary tail of right breast 08/24/2017  . Pain with urination 08/24/2017  . Urinary frequency 08/24/2017  . Hematuria 08/24/2017  . Encounter for well woman exam with routine gynecological exam 01/15/2016  . Benign cyst of right breast 11/29/2015  . Positive fecal occult blood test 07/22/2014  . History of breast cancer 07/22/2014  . Rectal bleeding 07/10/2014  . Lymphedema 06/15/2012  . BRCA1 negative 10/22/2011  . BRCA2 negative 10/22/2011  . Cancer of left breast ([place]) 06/30/2011    Class: Diagnosis of   Donato Heinz. Owens Shark PT  [place] Levo 08/08/2018, 5:36 PM  [place], [place], [address] Phone: [phone]   Fax:  [phone]  Name: MADYSEN FAIRCLOTH MRN: [id number] Date of Birth: September 19, 1971

## 2018-08-10 ENCOUNTER — Encounter: Payer: Self-pay | Admitting: Physical Therapy

## 2018-08-10 ENCOUNTER — Ambulatory Visit: Payer: BLUE CROSS/BLUE SHIELD | Admitting: Physical Therapy

## 2018-08-10 DIAGNOSIS — I890 Lymphedema, not elsewhere classified: Secondary | ICD-10-CM | POA: Diagnosis not present

## 2018-08-10 DIAGNOSIS — M25612 Stiffness of left shoulder, not elsewhere classified: Secondary | ICD-10-CM

## 2018-08-10 NOTE — Patient Instructions (Signed)
First of all, check with your insurance company to see if provider is in network    [facility] (for wigs and compression sleeves / gloves/gauntlets )  [address] [place], [place] [address] [phone]  Will file some insurances --- call for appointment   [facility] (for mastectomy prosthetics and garments) [address] [place], [place] [address] [phone] Will file some insurances --- call for appointment  [facility]  [address]  [place], [place] [address] [phone] Lower extremity garments  [facility] [address] [place], [place]  [address] [phone]  Cathy Rubel ( Medicaid certified lymphedema fitter) [phone] [email or web address]  Melissa Meares  [facility]  [phone]  [facility] [address] [place], [place] [address] [phone]  Other Resources: National Lymphedema Network:  www.lymphnet.org www.Klosetraining.com for patient articles and self manual lymph drainage information www.lymphedemablog.com has informative articles.  www.compressionguru.com www.lymphedemaproducts.com www.brightlifedirect.com www.compressionguru.com 

## 2018-08-10 NOTE — Therapy (Signed)
[place], [place], [address] Phone: [phone]   Fax:  [phone]  Physical Therapy Treatment  Patient Details  Name: Jessica Simpson MRN: [id number] Date of Birth: 07-18-1971 Referring Provider (PT): Dr. Lindi Adie   Encounter Date: 08/10/2018  PT End of Session - 08/10/18 1358    Visit Number  3    Number of Visits  6    Date for PT Re-Evaluation  09/07/18    PT Start Time  1300    PT Stop Time  1345    PT Time Calculation (min)  45 min    Activity Tolerance  Patient tolerated treatment well    Behavior During Therapy  [facility] for tasks assessed/performed       Past Medical History:  Diagnosis Date  . BRCA1 negative 10/22/2011  . BRCA2 negative 10/22/2011  . Breast cancer, IDC, Left, Stage III, Triple negative 06/30/2011  . Contraceptive education 01/15/2016  . Family history of breast cancer   . Fracture    right 4th toe  . History of breast cancer 07/22/2014  . History of radiation therapy 05/16/18- 06/22/18   Right Breast/ 50.4 Gy in 28 fractions. Right posterior axilla and SCV nodes/ 50.4 Gy in 28 fractions.   . Hypertension   . Lymphedema 06/15/2012  . Lymphedema of arm   . Personal history of chemotherapy   . Personal history of radiation therapy   . Positive fecal occult blood test 07/22/2014  . S/P radiation therapy 2013   50 gray in 25 fractions to the left breast, supraclavicular, and axillary regions. She then received a boost to the left lumpectomy of 10 gray in 5 fractions. This was given at [place].  . Status post chemotherapy Comp. 11/23/11   FEC and Taxotere    Past Surgical History:  Procedure Laterality Date  . 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 AXILLARY LYMPH NODE BIOPSY Right 04/06/2018  . AXILLARY LYMPH NODE DISSECTION Right 04/06/2018   Procedure: RIGHT AXILLARY LYMPH NODE DISSECTION;  Surgeon: Rolm Bookbinder, MD;  Location: [place];  Service: General;  Laterality: Right;  . BREAST LUMPECTOMY    . BREAST SURGERY    . CESAREAN SECTION    . COLONOSCOPY N/A 08/14/2014   Procedure: COLONOSCOPY;  Surgeon: Rogene Houston, MD;  [facility];  Service: Endoscopy;  Laterality: N/A;  930  . EVACUATION BREAST HEMATOMA  12/21/2011   Procedure: EVACUATION HEMATOMA BREAST;  Surgeon: Stark Klein, MD;  Location: [place];  Service: General;  Laterality: Left;  . HAND SURGERY     tumor on finger, left hand  . INCISION AND DRAINAGE ABSCESS ANAL     x3  . left breast biopsy with axillary biopsy     core bx  . PORT-A-CATH REMOVAL  12/21/2011   Procedure: REMOVAL PORT-A-CATH;  Surgeon: Haywood Lasso, MD;  Location: [place];  Service: General;  Laterality: N/A;  . PORTACATH PLACEMENT  08/02/2011   dr Margot Chimes  . PORTACATH PLACEMENT Right 11/14/2017   Procedure: INSERTION PORT-A-CATH WITH [place];  Surgeon: Rolm Bookbinder, MD;  Location: [place];  Service: General;  Laterality: Right;  . removal breast mass  2004   benign  . TREATMENT FISTULA ANAL    . TUMOR REMOVAL     on left hand  . TUMOR REMOVAL     rt. eye    There were  no vitals filed for this visit.  Subjective Assessment - 08/10/18 1356    Subjective  Pt says she was able to keep the bandages on until last night.     Pertinent History  Breast cancer Lt treated in 2012-2013 with chemotherapy/taxotere post lumpectomy and SLNB 3/7 nodes and radiation with onset of lymphedema during radiation, treatment of lymphedema at [facility] in 2013.  Reoccurence in Rt axillary lymph node with chemotherapy ongoing carboplatin and gemcitabine x 6 cycles and ALND 1/11 positive, radiation completed 06/19/18 on the Rt.  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  No/denies             LYMPHEDEMA/ONCOLOGY QUESTIONNAIRE - 08/10/18 1306      Left Upper Extremity Lymphedema   10 cm Proximal to Olecranon Process  36.5 cm    Olecranon Process  28.3 cm    10 cm Proximal to Ulnar Styloid Process  25.2 cm    Just Proximal to Ulnar Styloid Process  18.5 cm    Across Hand at [employer]  21.5 cm    At [place] of 2nd Digit  6.5 cm                [facility] Adult PT Treatment/Exercise - 08/10/18 0001      Manual Therapy   Edema Management  discussed option for Tribute night for LUE in addtion to compression bras, left UE flat knit and RUE circular knit     Manual Lymphatic Drainage (MLD)  short neck, superficial and deep abdominals, ( no axillary nodes ) left inguinal nodes and left axillo0inguinal anastamosis, left shoulder to hand with return,  then to sidelying for left back and lateral trunk              PT Education - 08/10/18 1358    Education Details  Where to go to get compression sleeves     Person(s) Educated  Patient    Methods  Explanation    Comprehension  Verbalized understanding          PT Long Term Goals - 07/27/18 1724      PT LONG TERM GOAL #1   Title  Pt will be knowledgeable about when to wear compression to stop lymphedema progression    Time  6    Period  Weeks    Status  New    Target Date  09/07/18      PT LONG TERM GOAL #2   Title  Pt will obtain new compression sleeve and glove for lymphedema     Time  6    Period  Weeks    Status  New    Target Date  09/07/18      PT LONG TERM GOAL #3   Title  Pt will be ind with self MLD or pump use for the Lt UE    Time  6    Period  Weeks    Status  New    Target Date  09/07/18      PT LONG TERM GOAL #4   Title  Pt be ind with self stretches for pectoralis/shoulders     Time  6    Period  Weeks    Status  New    Target Date  09/07/18  Plan - 08/10/18 1359    Clinical Impression Statement  Pt had good reduction with compression bandaging even  with taking them off last night.  She wants to wear her sleeve and glove today.  Her plan is to come and get wrapped on Monday, and keep them on until Wedsnesday when she will have a treatment with [place], then go directly to [facility] to get measured for garments     Rehab Potential  Excellent    PT Frequency  2x / week    PT Duration  6 weeks    PT Treatment/Interventions  ADLs/Self Care Home Management;Therapeutic exercise;Taping;Manual lymph drainage;Compression bandaging;DME Instruction    PT Next Visit Plan  check for script back, remeasure, and rewrap. plan  for garments with [facility] when pt is reduced  teach shoulder stretches        Patient will benefit from skilled therapeutic intervention in order to improve the following deficits and impairments:     Visit Diagnosis: Lymphedema, not elsewhere classified  Stiffness of left shoulder, not elsewhere classified     Problem List Patient Active Problem List   Diagnosis Date Noted  . 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 ([place]) 04/25/2018  . Genetic testing 01/12/2018  . Family history of breast cancer   . Port-A-Cath in place 11/21/2017  . Mass of axillary tail of right breast 08/24/2017  . Pain with urination 08/24/2017  . Urinary frequency 08/24/2017  . Hematuria 08/24/2017  . Encounter for well woman exam with routine gynecological exam 01/15/2016  . Benign cyst of right breast 11/29/2015  . Positive fecal occult blood test 07/22/2014  . History of breast cancer 07/22/2014  . Rectal bleeding 07/10/2014  . Lymphedema 06/15/2012  . BRCA1 negative 10/22/2011  . BRCA2 negative 10/22/2011  . Cancer of left breast ([place]) 06/30/2011    Class: Diagnosis of   Donato Heinz. Owens Shark PT  [place] Levo 08/10/2018, 2:03 PM  [place], [place], [address] Phone: [phone]   Fax:  [phone]  Name: AMADI YOSHINO MRN:  [id number] Date of Birth: 1971-01-04

## 2018-08-11 ENCOUNTER — Encounter

## 2018-08-14 ENCOUNTER — Encounter: Payer: Self-pay | Admitting: Rehabilitation

## 2018-08-14 ENCOUNTER — Ambulatory Visit: Payer: BLUE CROSS/BLUE SHIELD | Admitting: Rehabilitation

## 2018-08-14 DIAGNOSIS — M25612 Stiffness of left shoulder, not elsewhere classified: Secondary | ICD-10-CM

## 2018-08-14 DIAGNOSIS — I890 Lymphedema, not elsewhere classified: Secondary | ICD-10-CM

## 2018-08-14 NOTE — Therapy (Signed)
[place], [place], [address] Phone: [phone]   Fax:  [phone]  Physical Therapy Treatment  Patient Details  Name: Jessica Simpson MRN: [id number] Date of Birth: 1971-08-12 Referring Provider (PT): Dr. Lindi Adie   Encounter Date: 08/14/2018  PT End of Session - 08/14/18 1349    Visit Number  4    Number of Visits  6    Date for PT Re-Evaluation  09/07/18    PT Start Time  1304    PT Stop Time  1345    PT Time Calculation (min)  41 min    Activity Tolerance  Patient tolerated treatment well    Behavior During Therapy  [facility] for tasks assessed/performed       Past Medical History:  Diagnosis Date  . BRCA1 negative 10/22/2011  . BRCA2 negative 10/22/2011  . Breast cancer, IDC, Left, Stage III, Triple negative 06/30/2011  . Contraceptive education 01/15/2016  . Family history of breast cancer   . Fracture    right 4th toe  . History of breast cancer 07/22/2014  . History of radiation therapy 05/16/18- 06/22/18   Right Breast/ 50.4 Gy in 28 fractions. Right posterior axilla and SCV nodes/ 50.4 Gy in 28 fractions.   . Hypertension   . Lymphedema 06/15/2012  . Lymphedema of arm   . Personal history of chemotherapy   . Personal history of radiation therapy   . Positive fecal occult blood test 07/22/2014  . S/P radiation therapy 2013   50 gray in 25 fractions to the left breast, supraclavicular, and axillary regions. She then received a boost to the left lumpectomy of 10 gray in 5 fractions. This was given at [place].  . Status post chemotherapy Comp. 11/23/11   FEC and Taxotere    Past Surgical History:  Procedure Laterality Date  . 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 AXILLARY LYMPH NODE BIOPSY Right 04/06/2018  . AXILLARY LYMPH NODE DISSECTION Right 04/06/2018   Procedure: RIGHT AXILLARY LYMPH NODE DISSECTION;  Surgeon: Rolm Bookbinder, MD;  Location: [place];  Service: General;  Laterality: Right;  . BREAST LUMPECTOMY    . BREAST SURGERY    . CESAREAN SECTION    . COLONOSCOPY N/A 08/14/2014   Procedure: COLONOSCOPY;  Surgeon: Rogene Houston, MD;  [facility];  Service: Endoscopy;  Laterality: N/A;  930  . EVACUATION BREAST HEMATOMA  12/21/2011   Procedure: EVACUATION HEMATOMA BREAST;  Surgeon: Stark Klein, MD;  Location: [place];  Service: General;  Laterality: Left;  . HAND SURGERY     tumor on finger, left hand  . INCISION AND DRAINAGE ABSCESS ANAL     x3  . left breast biopsy with axillary biopsy     core bx  . PORT-A-CATH REMOVAL  12/21/2011   Procedure: REMOVAL PORT-A-CATH;  Surgeon: Haywood Lasso, MD;  Location: [place];  Service: General;  Laterality: N/A;  . PORTACATH PLACEMENT  08/02/2011   dr Margot Chimes  . PORTACATH PLACEMENT Right 11/14/2017   Procedure: INSERTION PORT-A-CATH WITH [place];  Surgeon: Rolm Bookbinder, MD;  Location: [place];  Service: General;  Laterality: Right;  . removal breast mass  2004   benign  . TREATMENT FISTULA ANAL    . TUMOR REMOVAL     on left hand  . TUMOR REMOVAL     rt. eye    There were  no vitals filed for this visit.  Subjective Assessment - 08/14/18 1304    Subjective  I have not made an appointment yet to get measured will call tomorrow.  I have chemo next tuesday so lets cancel wednesday since I won't feel well     Pertinent History  Breast cancer Lt treated in 2012-2013 with chemotherapy/taxotere post lumpectomy and SLNB 3/7 nodes and radiation with onset of lymphedema during radiation, treatment of lymphedema at [facility] in 2013.  Reoccurence in Rt axillary lymph node with chemotherapy ongoing carboplatin and gemcitabine x 6 cycles and ALND 1/11 positive, radiation completed 06/19/18 on the Rt.  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  No/denies                       [facility] Adult PT Treatment/Exercise - 08/14/18 0001      Manual Therapy   Edema Management  will get measured tomorrow or wed for garments     Manual Lymphatic Drainage (MLD)  short neck, superficial and deep abdominals, ( no axillary nodes ) left inguinal nodes and left axillo0inguinal anastamosis, left shoulder to hand with return,  then to sidelying for left back and lateral trunk     Compression Bandaging  Lt UE: thick stockinette, artiflex from hand to axilla, 1-6cm tothe hand 2-8cm working up the arm.  Only needing 3 bandages today for good compression due to decreased size.                    PT Long Term Goals - 07/27/18 1724      PT LONG TERM GOAL #1   Title  Pt will be knowledgeable about when to wear compression to stop lymphedema progression    Time  6    Period  Weeks    Status  New    Target Date  09/07/18      PT LONG TERM GOAL #2   Title  Pt will obtain new compression sleeve and glove for lymphedema     Time  6    Period  Weeks    Status  New    Target Date  09/07/18      PT LONG TERM GOAL #3   Title  Pt will be ind with self MLD or pump use for the Lt UE    Time  6    Period  Weeks    Status  New    Target Date  09/07/18      PT LONG TERM GOAL #4   Title  Pt be ind with self stretches for pectoralis/shoulders     Time  6    Period  Weeks    Status  New    Target Date  09/07/18            Plan - 08/14/18 1349    Clinical Impression Statement  Did not measure today due to time out of bandages. Rewrapped today with good size of the UE.  Pt will get measured today or Wed at [facility] due to good reduction.  Given 2 copies of the script    PT Frequency  2x / week    PT Duration  6 weeks    PT Treatment/Interventions  ADLs/Self Care Home Management;Therapeutic exercise;Taping;Manual  lymph drainage;Compression bandaging;DME Instruction    PT Next  Visit Plan  get measured? remeasure, and rewrap or but compression garments back on? Only has 1 visit next week.   teach shoulder stretches        Patient will benefit from skilled therapeutic intervention in order to improve the following deficits and impairments:  Decreased range of motion, Decreased knowledge of precautions, Increased edema  Visit Diagnosis: Lymphedema, not elsewhere classified  Stiffness of left shoulder, not elsewhere classified     Problem List Patient Active Problem List   Diagnosis Date Noted  . 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 ([place]) 04/25/2018  . Genetic testing 01/12/2018  . Family history of breast cancer   . Port-A-Cath in place 11/21/2017  . Mass of axillary tail of right breast 08/24/2017  . Pain with urination 08/24/2017  . Urinary frequency 08/24/2017  . Hematuria 08/24/2017  . Encounter for well woman exam with routine gynecological exam 01/15/2016  . Benign cyst of right breast 11/29/2015  . Positive fecal occult blood test 07/22/2014  . History of breast cancer 07/22/2014  . Rectal bleeding 07/10/2014  . Lymphedema 06/15/2012  . BRCA1 negative 10/22/2011  . BRCA2 negative 10/22/2011  . Cancer of left breast ([place]) 06/30/2011    Class: Diagnosis of    Shan Levans, PT 08/14/2018, 1:54 PM  [place], [place], [address] Phone: [phone]   Fax:  [phone]  Name: Jessica Simpson MRN: [id number] Date of Birth: 1971-03-24

## 2018-08-16 ENCOUNTER — Ambulatory Visit: Payer: BLUE CROSS/BLUE SHIELD | Admitting: Physical Therapy

## 2018-08-16 ENCOUNTER — Encounter: Payer: Self-pay | Admitting: Physical Therapy

## 2018-08-16 DIAGNOSIS — M25612 Stiffness of left shoulder, not elsewhere classified: Secondary | ICD-10-CM

## 2018-08-16 DIAGNOSIS — I890 Lymphedema, not elsewhere classified: Secondary | ICD-10-CM | POA: Diagnosis not present

## 2018-08-16 NOTE — Therapy (Signed)
[place], [place], [address] Phone: [phone]   Fax:  [phone]  Physical Therapy Treatment  Patient Details  Name: Jessica Simpson MRN: [id number] Date of Birth: 1971-05-27 Referring Provider (PT): Dr. Lindi Adie   Encounter Date: 08/16/2018  PT End of Session - 08/16/18 1616    Visit Number  5    Number of Visits  6    Date for PT Re-Evaluation  09/07/18    Activity Tolerance  Patient tolerated treatment well    Behavior During Therapy  [facility] for tasks assessed/performed       Past Medical History:  Diagnosis Date  . BRCA1 negative 10/22/2011  . BRCA2 negative 10/22/2011  . Breast cancer, IDC, Left, Stage III, Triple negative 06/30/2011  . Contraceptive education 01/15/2016  . Family history of breast cancer   . Fracture    right 4th toe  . History of breast cancer 07/22/2014  . History of radiation therapy 05/16/18- 06/22/18   Right Breast/ 50.4 Gy in 28 fractions. Right posterior axilla and SCV nodes/ 50.4 Gy in 28 fractions.   . Hypertension   . Lymphedema 06/15/2012  . Lymphedema of arm   . Personal history of chemotherapy   . Personal history of radiation therapy   . Positive fecal occult blood test 07/22/2014  . S/P radiation therapy 2013   50 gray in 25 fractions to the left breast, supraclavicular, and axillary regions. She then received a boost to the left lumpectomy of 10 gray in 5 fractions. This was given at [place].  . Status post chemotherapy Comp. 11/23/11   FEC and Taxotere    Past Surgical History:  Procedure Laterality Date  . 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 AXILLARY LYMPH NODE BIOPSY Right 04/06/2018  . AXILLARY LYMPH NODE DISSECTION Right 04/06/2018   Procedure: RIGHT AXILLARY LYMPH NODE DISSECTION;  Surgeon: Rolm Bookbinder, MD;  Location: [place];  Service: General;  Laterality: Right;  . BREAST  LUMPECTOMY    . BREAST SURGERY    . CESAREAN SECTION    . COLONOSCOPY N/A 08/14/2014   Procedure: COLONOSCOPY;  Surgeon: Rogene Houston, MD;  [facility];  Service: Endoscopy;  Laterality: N/A;  930  . EVACUATION BREAST HEMATOMA  12/21/2011   Procedure: EVACUATION HEMATOMA BREAST;  Surgeon: Stark Klein, MD;  Location: [place];  Service: General;  Laterality: Left;  . HAND SURGERY     tumor on finger, left hand  . INCISION AND DRAINAGE ABSCESS ANAL     x3  . left breast biopsy with axillary biopsy     core bx  . PORT-A-CATH REMOVAL  12/21/2011   Procedure: REMOVAL PORT-A-CATH;  Surgeon: Haywood Lasso, MD;  Location: [place];  Service: General;  Laterality: N/A;  . PORTACATH PLACEMENT  08/02/2011   dr Margot Chimes  . PORTACATH PLACEMENT Right 11/14/2017   Procedure: INSERTION PORT-A-CATH WITH [place];  Surgeon: Rolm Bookbinder, MD;  Location: [place];  Service: General;  Laterality: Right;  . removal breast mass  2004   benign  . TREATMENT FISTULA ANAL    . TUMOR REMOVAL     on left hand  . TUMOR REMOVAL     rt. eye    There were no vitals filed for this visit.  Subjective Assessment - 08/16/18 1615    Subjective  Pt is going to get measured for garments today.  If she gets her compression sleeve, she may not come back for PT. She will make an appointment as she needs it     Pertinent History  Breast cancer Lt treated in 2012-2013 with chemotherapy/taxotere post lumpectomy and SLNB 3/7 nodes and radiation with onset of lymphedema during radiation, treatment of lymphedema at [facility] in 2013.  Reoccurence in Rt axillary lymph node with chemotherapy ongoing carboplatin and gemcitabine x 6 cycles and ALND 1/11 positive, radiation completed 06/19/18 on the Rt.  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  No/denies             LYMPHEDEMA/ONCOLOGY QUESTIONNAIRE - 08/16/18 1523      Right Upper Extremity Lymphedema   10 cm Proximal to Olecranon Process  37.2 cm    Olecranon Process  29 cm    10 cm Proximal to Ulnar Styloid Process  25.8 cm    Just Proximal to Ulnar Styloid Process  19 cm    Across Hand at [employer]  21.5 cm    At [place] of 2nd Digit  6.9 cm      Left Upper Extremity Lymphedema   10 cm Proximal to Olecranon Process  36.3 cm    Olecranon Process  28.3 cm    10 cm Proximal to Ulnar Styloid Process  25.5 cm    Just Proximal to Ulnar Styloid Process  18.8 cm    Across Hand at [employer]  21 cm    At [place] of 2nd Digit  6.5 cm                [facility] Adult PT Treatment/Exercise - 08/16/18 0001      Exercises   Exercises  Shoulder      Shoulder Exercises: Supine   Other Supine Exercises  supine dowel rod stretches       Manual Therapy   Manual Therapy  Edema management;Manual Lymphatic Drainage (MLD)    Edema Management  remeasured both arms.  Is going to [facility] to be measured for day and nigttime garments today  gave pt small piece of small dotted foam to wear on back of left hand around to thenar eminence inside glove     Manual Lymphatic Drainage (MLD)  short neck, superficial and deep abdominals, ( no axillary nodes ) left inguinal nodes and left axillo0inguinal anastamosis, left shoulder to hand with return,                    PT Long Term Goals - 08/16/18 1619      PT LONG TERM GOAL #1   Title  Pt will be knowledgeable about when to wear compression to stop lymphedema progression    Status  Achieved      PT LONG TERM GOAL #2   Title  Pt will obtain new compression sleeve and glove for lymphedema     Time  6    Period  Weeks    Status  On-going      PT LONG TERM GOAL #3   Title  Pt will be ind with self MLD or pump use for the Lt UE    Time  6    Period  Weeks    Status  On-going      PT LONG  TERM GOAL #4   Title  Pt be ind  with self stretches for pectoralis/shoulders     Time  6    Period  Weeks    Status  On-going            Plan - 08/16/18 1616    Clinical Impression Statement  Pt has had reduction from initial eval, but is about the same as last week She took bandages off last night as her fingers were swelling.  She is waiting to hear if the compression pump with get covered by insurance .  She is anxious to get her day and nighttime sleeves. She is most concerned about her hand so gave her a piece of small dotted foam to wear inside the glove     Rehab Potential  Excellent    PT Frequency  2x / week    PT Duration  6 weeks    PT Treatment/Interventions  ADLs/Self Care Home Management;Therapeutic exercise;Taping;Manual lymph drainage;Compression bandaging;DME Instruction    PT Next Visit Plan  remeasure? check  garments? rewrap if needed        Patient will benefit from skilled therapeutic intervention in order to improve the following deficits and impairments:  Decreased range of motion, Decreased knowledge of precautions, Increased edema  Visit Diagnosis: Lymphedema, not elsewhere classified  Stiffness of left shoulder, not elsewhere classified     Problem List Patient Active Problem List   Diagnosis Date Noted  . 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 ([place]) 04/25/2018  . Genetic testing 01/12/2018  . Family history of breast cancer   . Port-A-Cath in place 11/21/2017  . Mass of axillary tail of right breast 08/24/2017  . Pain with urination 08/24/2017  . Urinary frequency 08/24/2017  . Hematuria 08/24/2017  . Encounter for well woman exam with routine gynecological exam 01/15/2016  . Benign cyst of right breast 11/29/2015  . Positive fecal occult blood test 07/22/2014  . History of breast cancer 07/22/2014  . Rectal bleeding 07/10/2014  . Lymphedema 06/15/2012  . BRCA1 negative 10/22/2011  . BRCA2 negative 10/22/2011  . Cancer of left breast ([place]) 06/30/2011    Class:  Diagnosis of   Donato Heinz. Owens Shark PT  Norwood Levo 08/16/2018, 4:21 PM  [place], [place], [address] Phone: [phone]   Fax:  [phone]  Name: SHAKEMIA MADERA MRN: [id number] Date of Birth: [birth year]

## 2018-08-21 ENCOUNTER — Encounter: Payer: Self-pay | Admitting: Rehabilitation

## 2018-08-21 ENCOUNTER — Ambulatory Visit: Payer: BLUE CROSS/BLUE SHIELD | Admitting: Rehabilitation

## 2018-08-21 DIAGNOSIS — M25612 Stiffness of left shoulder, not elsewhere classified: Secondary | ICD-10-CM

## 2018-08-21 DIAGNOSIS — I890 Lymphedema, not elsewhere classified: Secondary | ICD-10-CM | POA: Diagnosis not present

## 2018-08-21 NOTE — Therapy (Signed)
[place], [place], [address] Phone: [phone]   Fax:  [phone]  Physical Therapy Treatment  Patient Details  Name: Jessica Simpson MRN: [id number] Date of Birth: 03-08-1971 Referring Provider (PT): Dr. Lindi Simpson   Encounter Date: 08/21/2018  PT End of Session - 08/21/18 1304    Visit Number  6    Number of Visits  6    Date for PT Re-Evaluation  09/07/18    PT Start Time  1300    PT Stop Time  1343    PT Time Calculation (min)  43 min    Activity Tolerance  Patient tolerated treatment well    Behavior During Therapy  [facility] for tasks assessed/performed       Past Medical History:  Diagnosis Date  . BRCA1 negative 10/22/2011  . BRCA2 negative 10/22/2011  . Breast cancer, IDC, Left, Stage III, Triple negative 06/30/2011  . Contraceptive education 01/15/2016  . Family history of breast cancer   . Fracture    right 4th toe  . History of breast cancer 07/22/2014  . History of radiation therapy 05/16/18- 06/22/18   Right Breast/ 50.4 Gy in 28 fractions. Right posterior axilla and SCV nodes/ 50.4 Gy in 28 fractions.   . Hypertension   . Lymphedema 06/15/2012  . Lymphedema of arm   . Personal history of chemotherapy   . Personal history of radiation therapy   . Positive fecal occult blood test 07/22/2014  . S/P radiation therapy 2013   50 gray in 25 fractions to the left breast, supraclavicular, and axillary regions. She then received a boost to the left lumpectomy of 10 gray in 5 fractions. This was given at [place].  . Status post chemotherapy Comp. 11/23/11   FEC and Taxotere    Past Surgical History:  Procedure Laterality Date  . 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 AXILLARY LYMPH NODE BIOPSY Right 04/06/2018  . AXILLARY LYMPH NODE DISSECTION Right 04/06/2018   Procedure: RIGHT AXILLARY LYMPH NODE DISSECTION;  Surgeon: Rolm Bookbinder, MD;  Location: [place];  Service: General;  Laterality: Right;  . BREAST LUMPECTOMY    . BREAST SURGERY    . CESAREAN SECTION    . COLONOSCOPY N/A 08/14/2014   Procedure: COLONOSCOPY;  Surgeon: Rogene Houston, MD;  [facility];  Service: Endoscopy;  Laterality: N/A;  930  . EVACUATION BREAST HEMATOMA  12/21/2011   Procedure: EVACUATION HEMATOMA BREAST;  Surgeon: Stark Klein, MD;  Location: [place];  Service: General;  Laterality: Left;  . HAND SURGERY     tumor on finger, left hand  . INCISION AND DRAINAGE ABSCESS ANAL     x3  . left breast biopsy with axillary biopsy     core bx  . PORT-A-CATH REMOVAL  12/21/2011   Procedure: REMOVAL PORT-A-CATH;  Surgeon: Haywood Lasso, MD;  Location: [place];  Service: General;  Laterality: N/A;  . PORTACATH PLACEMENT  08/02/2011   dr Margot Chimes  . PORTACATH PLACEMENT Right 11/14/2017   Procedure: INSERTION PORT-A-CATH WITH [place];  Surgeon: Rolm Bookbinder, MD;  Location: [place];  Service: General;  Laterality: Right;  . removal breast mass  2004   benign  . TREATMENT FISTULA ANAL    . TUMOR REMOVAL     on left hand  . TUMOR REMOVAL     rt. eye    There were  no vitals filed for this visit.  Subjective Assessment - 08/21/18 1302    Subjective  Got measured for the garments.  Will be custom made and got something for the Rt UE.      Pertinent History  Breast cancer Lt treated in 2012-2013 with chemotherapy/taxotere post lumpectomy and SLNB 3/7 nodes and radiation with onset of lymphedema during radiation, treatment of lymphedema at [facility] in 2013.  Reoccurence in Rt axillary lymph node with chemotherapy ongoing carboplatin and gemcitabine x 6 cycles and ALND 1/11 positive, radiation completed 06/19/18 on the Rt.  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  No/denies                        [facility] Adult PT Treatment/Exercise - 08/21/18 0001      Manual Therapy   Edema Management  educated with pt performing MLD sequence with cueing as needed    Manual Lymphatic Drainage (MLD)  short neck, superficial and deep abdominals, ( no axillary nodes ) left inguinal nodes and left axillo0inguinal anastamosis, left shoulder to hand with return,               PT Education - 08/21/18 1345    Education Details  self MLD     Person(s) Educated  Patient    Methods  Explanation;Demonstration;Tactile cues;Verbal cues;Handout    Comprehension  Verbalized understanding;Returned demonstration          PT Long Term Goals - 08/21/18 1346      PT LONG TERM GOAL #1   Title  Pt will be knowledgeable about when to wear compression to stop lymphedema progression    Status  Achieved      PT LONG TERM GOAL #2   Title  Pt will obtain new compression sleeve and glove for lymphedema     Status  Partially Met      PT LONG TERM GOAL #3   Title  Pt will be ind with self MLD or pump use for the Lt UE    Status  Achieved      PT LONG TERM GOAL #4   Title  Pt be ind with self stretches for pectoralis/shoulders     Status  Deferred            Plan - 08/21/18 1345    Clinical Impression Statement  [name] is now set up for independent care for her lymphedema.  She will wear her old sleeve until the new garments come in.  She is ind with self MLD and should be getting a flexitouch pump soon.      Consulted and Agree with Plan of Care  Patient       Patient will benefit from skilled therapeutic intervention in order to improve the following deficits and impairments:  Decreased range of motion, Decreased knowledge of precautions, Increased edema  Visit Diagnosis: Lymphedema, not elsewhere classified  Stiffness of left shoulder, not elsewhere classified     Problem List Patient Active Problem List   Diagnosis Date Noted  . Cancer of axillary  tail of right female breast ([place]) 04/25/2018  . Genetic testing 01/12/2018  . Family history of breast cancer   . Port-A-Cath in place 11/21/2017  . Mass of axillary tail of right breast 08/24/2017  .  Pain with urination 08/24/2017  . Urinary frequency 08/24/2017  . Hematuria 08/24/2017  . Encounter for well woman exam with routine gynecological exam 01/15/2016  . Benign cyst of right breast 11/29/2015  . Positive fecal occult blood test 07/22/2014  . History of breast cancer 07/22/2014  . Rectal bleeding 07/10/2014  . Lymphedema 06/15/2012  . BRCA1 negative 10/22/2011  . BRCA2 negative 10/22/2011  . Cancer of left breast ([place]) 06/30/2011    Class: Diagnosis of    Shan Levans, PT 08/21/2018, 1:48 PM  [place], [place], [address] Phone: [phone]   Fax:  [phone]  Name: Jessica Simpson MRN: [id number] Date of Birth: [birth year]  PHYSICAL THERAPY DISCHARGE SUMMARY  Visits from Start of Care: 6  Current functional level related to goals / functional outcomes: See above   Remaining deficits: Need for continual lymphedema care   Education / Equipment: Got custom garments Rt UE day and night, OTS Lt garment, and should be getting a compression pump Plan: Patient agrees to discharge.  Patient goals were met. Patient is being discharged due to meeting the stated rehab goals.  ?????    Shan Levans, PT

## 2018-08-21 NOTE — Patient Instructions (Signed)

## 2018-08-22 ENCOUNTER — Inpatient Hospital Stay (HOSPITAL_BASED_OUTPATIENT_CLINIC_OR_DEPARTMENT_OTHER): Payer: BLUE CROSS/BLUE SHIELD | Admitting: Hematology and Oncology

## 2018-08-22 ENCOUNTER — Inpatient Hospital Stay: Payer: BLUE CROSS/BLUE SHIELD

## 2018-08-22 ENCOUNTER — Telehealth: Payer: Self-pay | Admitting: Hematology and Oncology

## 2018-08-22 DIAGNOSIS — Z171 Estrogen receptor negative status [ER-]: Secondary | ICD-10-CM | POA: Diagnosis not present

## 2018-08-22 DIAGNOSIS — Z923 Personal history of irradiation: Secondary | ICD-10-CM

## 2018-08-22 DIAGNOSIS — N6331 Unspecified lump in axillary tail of the right breast: Secondary | ICD-10-CM

## 2018-08-22 DIAGNOSIS — Z5111 Encounter for antineoplastic chemotherapy: Secondary | ICD-10-CM | POA: Diagnosis not present

## 2018-08-22 DIAGNOSIS — C50012 Malignant neoplasm of nipple and areola, left female breast: Secondary | ICD-10-CM

## 2018-08-22 DIAGNOSIS — R110 Nausea: Secondary | ICD-10-CM

## 2018-08-22 DIAGNOSIS — Z95828 Presence of other vascular implants and grafts: Secondary | ICD-10-CM

## 2018-08-22 DIAGNOSIS — Z79899 Other long term (current) drug therapy: Secondary | ICD-10-CM

## 2018-08-22 DIAGNOSIS — R5383 Other fatigue: Secondary | ICD-10-CM

## 2018-08-22 DIAGNOSIS — C50611 Malignant neoplasm of axillary tail of right female breast: Secondary | ICD-10-CM

## 2018-08-22 DIAGNOSIS — C7989 Secondary malignant neoplasm of other specified sites: Secondary | ICD-10-CM | POA: Diagnosis not present

## 2018-08-22 DIAGNOSIS — D709 Neutropenia, unspecified: Secondary | ICD-10-CM

## 2018-08-22 DIAGNOSIS — R51 Headache: Secondary | ICD-10-CM

## 2018-08-22 LAB — CBC WITH DIFFERENTIAL (CANCER CENTER ONLY)
ABS IMMATURE GRANULOCYTES: 0.03 10*3/uL (ref 0.00–0.07)
BASOS PCT: 0 %
Basophils Absolute: 0 10*3/uL (ref 0.0–0.1)
Eosinophils Absolute: 0.1 10*3/uL (ref 0.0–0.5)
Eosinophils Relative: 3 %
HEMATOCRIT: 34 % — AB (ref 36.0–46.0)
Hemoglobin: 10.9 g/dL — ABNORMAL LOW (ref 12.0–15.0)
IMMATURE GRANULOCYTES: 1 %
Lymphocytes Relative: 24 %
Lymphs Abs: 0.7 10*3/uL (ref 0.7–4.0)
MCH: 26.5 pg (ref 26.0–34.0)
MCHC: 32.1 g/dL (ref 30.0–36.0)
MCV: 82.7 fL (ref 80.0–100.0)
MONOS PCT: 19 %
Monocytes Absolute: 0.5 10*3/uL (ref 0.1–1.0)
NEUTROS ABS: 1.5 10*3/uL — AB (ref 1.7–7.7)
NEUTROS PCT: 53 %
NRBC: 0 % (ref 0.0–0.2)
Platelet Count: 216 10*3/uL (ref 150–400)
RBC: 4.11 MIL/uL (ref 3.87–5.11)
RDW: 16.5 % — AB (ref 11.5–15.5)
WBC: 2.7 10*3/uL — AB (ref 4.0–10.5)

## 2018-08-22 LAB — CMP (CANCER CENTER ONLY)
ALBUMIN: 3.6 g/dL (ref 3.5–5.0)
ALT: 16 U/L (ref 0–44)
ANION GAP: 9 (ref 5–15)
AST: 15 U/L (ref 15–41)
Alkaline Phosphatase: 108 U/L (ref 38–126)
BILIRUBIN TOTAL: 0.4 mg/dL (ref 0.3–1.2)
BUN: 12 mg/dL (ref 6–20)
CO2: 27 mmol/L (ref 22–32)
Calcium: 9.3 mg/dL (ref 8.9–10.3)
Chloride: 105 mmol/L (ref 98–111)
Creatinine: 1 mg/dL (ref 0.44–1.00)
GFR, Est AFR Am: >60 mL/min (ref >60)
GFR, Estimated: >60 mL/min (ref >60)
GLUCOSE: 121 mg/dL — AB (ref 70–99)
POTASSIUM: 3.6 mmol/L (ref 3.5–5.1)
Sodium: 141 mmol/L (ref 135–145)
TOTAL PROTEIN: 7.1 g/dL (ref 6.5–8.1)

## 2018-08-22 MED ORDER — SODIUM CHLORIDE 0.9 % IV SOLN
600.0000 mg/m2 | Freq: Once | INTRAVENOUS | Status: AC
  Administered 2018-08-22: 1300 mg via INTRAVENOUS
  Filled 2018-08-22: qty 65

## 2018-08-22 MED ORDER — PALONOSETRON HCL INJECTION 0.25 MG/5ML: 0.2500 mg | Freq: Once | INTRAVENOUS | Status: DC

## 2018-08-22 MED ORDER — HEPARIN SOD (PORK) LOCK FLUSH 100 UNIT/ML IV SOLN
500.0000 [IU] | Freq: Once | INTRAVENOUS | Status: AC | PRN
  Administered 2018-08-22: 500 [IU]
  Filled 2018-08-22: qty 5

## 2018-08-22 MED ORDER — METHOTREXATE SODIUM (PF) CHEMO INJECTION 250 MG/10ML
39.8750 mg/m2 | Freq: Once | INTRAMUSCULAR | Status: AC
  Administered 2018-08-22: 87 mg via INTRAVENOUS
  Filled 2018-08-22: qty 3.48

## 2018-08-22 MED ORDER — PALONOSETRON HCL INJECTION 0.25 MG/5ML
0.2500 mg | Freq: Once | INTRAVENOUS | Status: AC
  Administered 2018-08-22: 0.25 mg via INTRAVENOUS

## 2018-08-22 MED ORDER — DEXAMETHASONE SODIUM PHOSPHATE 10 MG/ML IJ SOLN
10.0000 mg | Freq: Once | INTRAMUSCULAR | Status: AC
  Administered 2018-08-22: 10 mg via INTRAVENOUS

## 2018-08-22 MED ORDER — SODIUM CHLORIDE 0.9 % IJ SOLN
10.0000 mL | Freq: Once | INTRAMUSCULAR | Status: AC
  Administered 2018-08-22: 10 mL
  Filled 2018-08-22: qty 10

## 2018-08-22 MED ORDER — SODIUM CHLORIDE 0.9% FLUSH
10.0000 mL | INTRAVENOUS | Status: DC | PRN
  Administered 2018-08-22: 10 mL
  Filled 2018-08-22: qty 10

## 2018-08-22 MED ORDER — PALONOSETRON HCL INJECTION 0.25 MG/5ML
INTRAVENOUS | Status: AC
  Filled 2018-08-22: qty 5

## 2018-08-22 MED ORDER — SODIUM CHLORIDE 0.9 % IV SOLN
Freq: Once | INTRAVENOUS | Status: AC
  Administered 2018-08-22: 13:00:00 via INTRAVENOUS
  Filled 2018-08-22: qty 250

## 2018-08-22 MED ORDER — DEXAMETHASONE SODIUM PHOSPHATE 10 MG/ML IJ SOLN
INTRAMUSCULAR | Status: AC
  Filled 2018-08-22: qty 1

## 2018-08-22 MED ORDER — FLUOROURACIL CHEMO INJECTION 2.5 GM/50ML
600.0000 mg/m2 | Freq: Once | INTRAVENOUS | Status: AC
  Administered 2018-08-22: 1300 mg via INTRAVENOUS
  Filled 2018-08-22: qty 26

## 2018-08-22 NOTE — Telephone Encounter (Signed)
No 10/29 los orders.  °

## 2018-08-22 NOTE — Assessment & Plan Note (Signed)
Prior left breast cancer 2012: Treated with neoadjuvant FEC-Taxotere status post lumpectomy radiation. Recurrence: Right axillary lymph node. Neoadjuvant chemo carboplatin and gemcitabine x6 cycles Right axillary lymph node dissection 1/11 lymph nodes positive Adjuvant radiation with Xelodacompleted 06/19/18  Current treatment:Cycle 3day  1CMF adj chemo Labs reviewed: No evidence of neutropenia. We will maintain the same dosage. Chemo Toxicities: 1.Headache: Persistent.  I discussed with her that if the headaches continue then we may have to scan her head. 2.mild nausea: Did not require any nausea medication at home.  Labs have been reviewed Monitoring closely for chemotherapy side effects.  RTC in 3 weeks for cycle 4

## 2018-08-22 NOTE — Patient Instructions (Signed)
[facility] Discharge Instructions for Patients Receiving Chemotherapy  Today you received the following chemotherapy agents: cyclophosphamide (Cytoxan), methotrexate (PF), and fluorouracil (Adrucil/5FU)  To help prevent nausea and vomiting after your treatment, we encourage you to take your nausea medication as directed.    If you develop nausea and vomiting that is not controlled by your nausea medication, call the clinic.   BELOW ARE SYMPTOMS THAT SHOULD BE REPORTED IMMEDIATELY:  *FEVER GREATER THAN 100.5 F  *CHILLS WITH OR WITHOUT FEVER  NAUSEA AND VOMITING THAT IS NOT CONTROLLED WITH YOUR NAUSEA MEDICATION  *UNUSUAL SHORTNESS OF BREATH  *UNUSUAL BRUISING OR BLEEDING  TENDERNESS IN MOUTH AND THROAT WITH OR WITHOUT PRESENCE OF ULCERS  *URINARY PROBLEMS  *BOWEL PROBLEMS  UNUSUAL RASH Items with * indicate a potential emergency and should be followed up as soon as possible.  Feel free to call the clinic should you have any questions or concerns. The clinic phone number is [phone].  Please show the CHEMO ALERT CARD at check-in to the Emergency Department and triage nurse.   

## 2018-08-22 NOTE — Progress Notes (Signed)
Pt has experienced HA likely associated w/ Aloxi.  Pt reported to Carolanne Grumbling, RN that her HA onset occurs on same day of tx and last for a couple of days.  With cycle 2 the duration of HA < than w/ 1st cycle. Dr. Lindi Adie was ok to d/c the Aloxi w/ her txs since no CINV reported, however pt wants to continue receiving Aloxi and states "HA are not that bad" and she would rather get the HA than struggle w/ CINV. Kennith Center, Pharm.D., CPP 08/22/2018@1 :32 PM

## 2018-08-22 NOTE — Progress Notes (Signed)
Patient Care Team: Patient, [name] as PCP - General ([place]) Neldon Mc, MD as Surgeon (General Surgery) Everardo All, MD (Hematology and Oncology)  DIAGNOSIS:    ICD-10-CM   1. 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, unspecified estrogen receptor status ([place]) C50.012     SUMMARY OF ONCOLOGIC HISTORY:   Cancer of left breast ([place])   06/30/2011 Initial Diagnosis    Breast cancer, IDC, Left, Stage III, Triple negative, 7.6 cm breast mass and palpable axillary mass    09/06/2011 Miscellaneous    BRCA 1 and 2: Negative    09/14/2011 - 11/23/2011 Neo-Adjuvant Chemotherapy    Dose dense FEC followed by dose dense Taxotere    12/21/2011 Surgery    Left lumpectomy: High-grade poorly differentiated IDC 1.7 cm with high-grade DCIS, margins negative, 3/7 lymph nodes positive, ER 0%, PR 0%, HER-2 negative ratio 1.33 T1CN1 stage IIb    12/31/2011 - 02/15/2012 Radiation Therapy    Radiation at [facility]    09/26/2017 Relapse/Recurrence    Left breast upper outer quadrant within the lumpectomy bed: Fibrosis no malignancy, right axillary lymph node biopsy metastatic high-grade carcinoma ER 0%, PR 0%, Ki-67 90%, HER-2 negative ratio 1.11 (similar to previous ductal carcinoma)    11/14/2017 - 03/06/2018 Neo-Adjuvant Chemotherapy    Neo-Adjuvant chemotherapy with Gemzar and carboplatin days 1 and 8 every 3 weeks    01/10/2018 Genetic Testing    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  The Hereditary Gene Panel offered by [facility] includes sequencing and/or deletion duplication testing of the following 47 genes: APC, ATM, AXIN2, BARD1, BMPR1A, BRCA1, BRCA2, BRIP1, CDH1, CDK4, CDKN2A (p14ARF), CDKN2A (p16INK4a), CHEK2, CTNNA1, DICER1, EPCAM (Deletion/duplication testing only), GREM1 (promoter region deletion/duplication testing only), KIT, MEN1, MLH1, MSH2, MSH3, MSH6, MUTYH, NBN, NF1, NHTL1, PALB2, PDGFRA, PMS2, POLD1, POLE, PTEN,  RAD50, RAD51C, RAD51D, SDHB, SDHC, SDHD, SMAD4, SMARCA4. STK11, TP53, TSC1, TSC2, and VHL.  The following genes were evaluated for sequence changes only: SDHA and HOXB13 c.251G>A variant only. The report date is January 10, 2018.      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 with above-mentioned history of initial left breast cancer now right breast cancer with recurrence for which she is on adjuvant chemotherapy with CMF. Today is cycle 3 of treatment. She notes that with her last cycle, she had persistent fatigue and mild intermittent nausea. She hasn't tried any medications for her nausea. She denies bowel issues, tingling to hands/feet, mouth sores, or any other symptoms.    REVIEW OF SYSTEMS:   Constitutional: Denies fevers, chills or abnormal weight loss (+) persistent fatigue Eyes: Denies blurriness of vision Ears, nose, mouth, throat, and face: Denies mucositis, sore throat, or mouth sores Respiratory: Denies cough, dyspnea  or wheezes Cardiovascular: Denies palpitation, chest discomfort Gastrointestinal:  Denies heartburn or change in bowel habits (+) nausea Skin: Denies abnormal skin rashes Lymphatics: Denies new lymphadenopathy or easy bruising Neurological:Denies numbness, tingling or new weaknesses Behavioral/Psych: Mood is stable, no new changes  Extremities:  No lower extremity edema Breast: denies any pain or lumps or nodules in either breasts All other systems were reviewed with the patient and are negative.  I have reviewed the past medical history, past surgical history, social history and family history with the patient and they are unchanged from previous note.  ALLERGIES:  is allergic to codeine and lortab [hydrocodone-acetaminophen].  MEDICATIONS:  Current Outpatient Medications  Medication Sig Dispense Refill  . acetaminophen (TYLENOL) 500 MG tablet Take 500 mg by mouth every 6 (six) hours as needed. For pain     . amLODipine (NORVASC) 5 MG tablet Take 5 mg by mouth daily.  6  . Cholecalciferol 5000 units capsule Take 1 capsule (5,000 Units total) by mouth daily.    Marland Kitchen gabapentin (NEURONTIN) 100 MG capsule Take 100 mg by mouth at bedtime.    Marland Kitchen ibuprofen (ADVIL,MOTRIN) 400 MG tablet Take 400 mg by mouth every 8 (eight) hours as needed.    . lidocaine-prilocaine (EMLA) cream Apply to affected area once 30 g 3  . loratadine (CLARITIN) 10 MG tablet Take 10 mg by mouth daily as needed for allergies.     Marland Kitchen LORazepam (ATIVAN) 0.5 MG tablet Take 1 tablet (0.5 mg total) by mouth every 6 (six) hours as needed (Nausea or vomiting). 30 tablet 0  . methocarbamol (ROBAXIN) 500 MG tablet Take 1 tablet (500 mg total) by mouth every 8 (eight) hours as needed for muscle spasms. 20 tablet 0  . Multiple Vitamin (MULTIVITAMIN) tablet Take 1 tablet by mouth daily.    . ondansetron (ZOFRAN) 8 MG tablet Take 1 tablet (8 mg total) by mouth 2 (two) times daily as needed for refractory nausea / vomiting. Start on day 3 after chemotherapy. 30 tablet 1  . oxyCODONE (OXY IR/ROXICODONE) 5 MG immediate release tablet Take 1 tablet (5 mg total) by mouth every 6 (six) hours as needed for moderate pain. 15 tablet 0  . prochlorperazine (COMPAZINE) 10 MG tablet Take 1 tablet (10 mg total) by mouth every 6 (six) hours as needed (Nausea or vomiting). 30 tablet 1  . promethazine  (PHENERGAN) 12.5 MG tablet Take 1 tablet (12.5 mg total) by mouth every 6 (six) hours as needed for nausea or vomiting. 10 tablet 0  . traMADol (ULTRAM) 50 MG tablet Take 2 tablets (100 mg total) by mouth every 6 (six) hours as needed. 10 tablet 0  . vitamin B-12 (CYANOCOBALAMIN) 500 MCG tablet Take 500 mcg by mouth daily.     No current facility-administered medications for this visit.    Facility-Administered Medications Ordered in Other Visits  Medication Dose Route Frequency Provider Last Rate Last Dose  . lactated ringers infusion    Continuous PRN Kerby Less, CRNA        PHYSICAL EXAMINATION: ECOG PERFORMANCE STATUS: 1 - Symptomatic but completely ambulatory  Vitals:   08/22/18 1202  BP: (!) 140/94  Pulse: 90  Resp: 18  Temp: 98.4 F (36.9 C)  SpO2: 100%   Filed Weights   08/22/18 1202  Weight: 230 lb 4.8 oz (104.5 kg)    GENERAL:alert, no distress and comfortable SKIN: skin color, texture, turgor are normal, no rashes or significant lesions EYES: normal, Conjunctiva are pink and non-injected,  sclera clear OROPHARYNX:no exudate, no erythema and lips, buccal mucosa, and tongue normal  NECK: supple, thyroid normal size, non-tender, without nodularity LYMPH:  no palpable lymphadenopathy in the cervical, axillary or inguinal LUNGS: clear to auscultation and percussion with normal breathing effort HEART: regular rate & rhythm and no murmurs and no lower extremity edema ABDOMEN:abdomen soft, non-tender and normal bowel sounds MUSCULOSKELETAL:no cyanosis of digits and no clubbing  NEURO: alert & oriented x 3 with fluent speech, no focal motor/sensory deficits EXTREMITIES: No lower extremity edema   LABORATORY DATA:  I have reviewed the data as listed CMP Latest Ref Rng & Units 08/01/2018 07/18/2018 07/11/2018  Glucose 70 - 99 mg/dL 109(H) 113(H) 113(H)  BUN 6 - 20 mg/dL _0 Creatinine 0.44 - 1.00 mg/dL 0.93 0.85 0.94  Sodium 135 - 145 mmol/L 141 136 141    Potassium 3.5 - 5.1 mmol/L 3.9 3.5 3.5  Chloride 98 - 111 mmol/L 104 102 104  CO2 22 - 32 mmol/L _1 Calcium 8.9 - 10.3 mg/dL 9.5 9.2 9.5  Total Protein 6.5 - 8.1 g/dL 7.4 7.4 7.7  Total Bilirubin 0.3 - 1.2 mg/dL 0.3 0.3 0.3  Alkaline Phos 38 - 126 U/L 112 98 119  AST 15 - 41 U/L 15 11(L) 15  ALT 0 - 44 U/L _2 Lab Results  Component Value Date   WBC 2.7 (L) 08/22/2018   HGB 10.9 (L) 08/22/2018   HCT 34.0 (L) 08/22/2018   MCV 82.7 08/22/2018   PLT 216 08/22/2018   NEUTROABS 1.5 (L) 08/22/2018    ASSESSMENT & PLAN:  Cancer of left breast ([place]) Prior left breast cancer 2012: Treated with neoadjuvant FEC-Taxotere status post lumpectomy radiation. Recurrence: Right axillary lymph node. Neoadjuvant chemo carboplatin and gemcitabine x6 cycles Right axillary lymph node dissection 1/11 lymph nodes positive Adjuvant radiation with Xelodacompleted 06/19/18  Current treatment:Cycle 3day  1CMF adj chemo Labs reviewed: No evidence of neutropenia. We will maintain the same dosage.  Chemo Toxicities: 1.Headache:  2.mild nausea: Did not require any nausea medication at home. 3.  Fatigue that lasts for 1 to 2 days 4.  Neutropenia: ANC 1.5, I discussed with her that if her [place] does not stay up then we may have to reduce her dosage on and hold treatment.  Today she can proceed with her chemotherapy as planned.  Labs have been reviewed Monitoring closely for chemotherapy side effects.  RTC in 3 weeks for cycle 4  No orders of the defined types were placed in this encounter.  The patient has a good understanding of the overall plan. she agrees with it. she will call with any problems that may develop before the next visit here.  Nicholas Lose, MD 08/22/2018  I, Soijett Blue am acting as scribe for Dr. Nicholas Lose.  I have reviewed the above documentation for accuracy and completeness, and I agree with the above.

## 2018-08-23 ENCOUNTER — Other Ambulatory Visit: Payer: Self-pay | Admitting: Hematology and Oncology

## 2018-08-23 ENCOUNTER — Ambulatory Visit: Payer: BLUE CROSS/BLUE SHIELD | Admitting: Physical Therapy

## 2018-08-23 DIAGNOSIS — Z853 Personal history of malignant neoplasm of breast: Secondary | ICD-10-CM

## 2018-08-28 ENCOUNTER — Ambulatory Visit: Payer: BLUE CROSS/BLUE SHIELD | Admitting: Rehabilitation

## 2018-08-30 ENCOUNTER — Encounter: Payer: Self-pay | Admitting: Physical Therapy

## 2018-08-30 ENCOUNTER — Ambulatory Visit: Payer: BLUE CROSS/BLUE SHIELD | Attending: Hematology and Oncology | Admitting: Physical Therapy

## 2018-08-30 DIAGNOSIS — I890 Lymphedema, not elsewhere classified: Secondary | ICD-10-CM

## 2018-08-30 DIAGNOSIS — M25612 Stiffness of left shoulder, not elsewhere classified: Secondary | ICD-10-CM | POA: Insufficient documentation

## 2018-08-30 NOTE — Therapy (Signed)
[place], [place], [address] Phone: [phone]   Fax:  [phone]  Physical Therapy Treatment  Patient Details  Name: Jessica Simpson MRN: [id number] Date of Birth: 14-Jul-1971 Referring Provider (PT): Dr. Lindi Adie    Encounter Date: 08/30/2018  PT End of Session - 08/30/18 1906    Visit Number  6    Number of Visits  14    Date for PT Re-Evaluation  09/29/18    PT Start Time  1355   pt arrived late   PT Stop Time  1430    PT Time Calculation (min)  35 min    Activity Tolerance  Patient tolerated treatment well    Behavior During Therapy  [facility] for tasks assessed/performed       Past Medical History:  Diagnosis Date  . BRCA1 negative 10/22/2011  . BRCA2 negative 10/22/2011  . Breast cancer, IDC, Left, Stage III, Triple negative 06/30/2011  . Contraceptive education 01/15/2016  . Family history of breast cancer   . Fracture    right 4th toe  . History of breast cancer 07/22/2014  . History of radiation therapy 05/16/18- 06/22/18   Right Breast/ 50.4 Gy in 28 fractions. Right posterior axilla and SCV nodes/ 50.4 Gy in 28 fractions.   . Hypertension   . Lymphedema 06/15/2012  . Lymphedema of arm   . Personal history of chemotherapy   . Personal history of radiation therapy   . Positive fecal occult blood test 07/22/2014  . S/P radiation therapy 2013   50 gray in 25 fractions to the left breast, supraclavicular, and axillary regions. She then received a boost to the left lumpectomy of 10 gray in 5 fractions. This was given at [facility].  . Status post chemotherapy Comp. 11/23/11   FEC and Taxotere    Past Surgical History:  Procedure Laterality Date  . 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 AXILLARY LYMPH NODE BIOPSY Right 04/06/2018  . AXILLARY LYMPH NODE DISSECTION Right 04/06/2018   Procedure: RIGHT AXILLARY LYMPH NODE DISSECTION;   Surgeon: Rolm Bookbinder, MD;  Location: [facility];  Service: General;  Laterality: Right;  . BREAST LUMPECTOMY    . BREAST SURGERY    . CESAREAN SECTION    . COLONOSCOPY N/A 08/14/2014   Procedure: COLONOSCOPY;  Surgeon: Rogene Houston, MD;  [facility];  Service: Endoscopy;  Laterality: N/A;  930  . EVACUATION BREAST HEMATOMA  12/21/2011   Procedure: EVACUATION HEMATOMA BREAST;  Surgeon: Stark Klein, MD;  Location: [facility];  Service: General;  Laterality: Left;  . HAND SURGERY     tumor on finger, left hand  . INCISION AND DRAINAGE ABSCESS ANAL     x3  . left breast biopsy with axillary biopsy     core bx  . PORT-A-CATH REMOVAL  12/21/2011   Procedure: REMOVAL PORT-A-CATH;  Surgeon: Haywood Lasso, MD;  Location: [facility];  Service: General;  Laterality: N/A;  . PORTACATH PLACEMENT  08/02/2011   dr Margot Chimes  . PORTACATH PLACEMENT Right 11/14/2017   Procedure: INSERTION PORT-A-CATH WITH [place];  Surgeon: Rolm Bookbinder, MD;  Location: [facility];  Service: General;  Laterality: Right;  . removal breast mass  2004   benign  . TREATMENT FISTULA ANAL    . TUMOR REMOVAL     on left hand  . TUMOR REMOVAL     rt. eye  There were no vitals filed for this visit.  Subjective Assessment - 08/30/18 1901    Subjective  Pt still had appointment scheduled for PT and got a phone message notice so returned to PT even though discharge was done last visit.  She wants help in getting approval for Flexitouch as she got a denial from [employer] and [employer]. She wants to continue PT as lymphedema is persistent     Pertinent History  Breast cancer Lt treated in 2012-2013 with chemotherapy/taxotere post lumpectomy and SLNB 3/7 nodes and radiation with onset of lymphedema during radiation, treatment of lymphedema at [facility] in 2013.  Reoccurence in Rt axillary lymph node with chemotherapy ongoing carboplatin and gemcitabine x 6 cycles and ALND 1/11 positive, radiation completed  06/19/18 on the Rt.  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  No/denies         [facility] PT Assessment - 08/30/18 0001      Assessment   Medical Diagnosis  lymphedema Lt UE    Referring Provider (PT)  Dr. Lindi Adie     Onset Date/Surgical Date  01/24/12      Prior Function   Level of Independence  Independent                   [facility] Adult PT Treatment/Exercise - 08/30/18 0001      Manual Therapy   Manual Lymphatic Drainage (MLD)  short neck, stationalry circle and MLD pumps to left shoulder and arm with extra time spent on forearm and hand . then to sideling for stationary circles and pumps to lateral trunk and back                   PT Long Term Goals - 08/30/18 1911      PT LONG TERM GOAL #1   Title  Pt will be knowledgeable about when to wear compression to stop lymphedema progression    Status  Achieved      PT LONG TERM GOAL #2   Title  Pt will obtain new compression sleeve and glove for lymphedema     Time  4    Period  Weeks    Status  On-going      PT LONG TERM GOAL #3   Title  Pt will be ind with self MLD or pump use for the Lt UE    Time  4    Status  On-going      PT LONG TERM GOAL #4   Title  Pt be ind with self stretches for pectoralis/shoulders     Time  4    Status  On-going            Plan - 08/30/18 1907    Clinical Impression Statement  Pt returns for continued PT as her lymphedema is persisitent in her left arm and trunk despeite consistent use of her compression garments and exercise.  She needs to have the Flexitouch for independence in home managment  She is also waiting for her flat knit compression sleeve ,glove and nigthtime garment to control persistent lymphostatic fibrosis     PT Frequency  2x / week    PT Duration  4 weeks    PT Next Visit Plan  remeasure? check  garments? rewrap if needed        Patient will benefit from skilled therapeutic  intervention in order to improve  the following deficits and impairments:     Visit Diagnosis: Lymphedema, not elsewhere classified - Plan: PT plan of care cert/re-cert  Stiffness of left shoulder, not elsewhere classified - Plan: PT plan of care cert/re-cert     Problem List Patient Active Problem List   Diagnosis Date Noted  . 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 Genetic testing 01/12/2018  . Family history of breast cancer   . Port-A-Cath in place 11/21/2017  . Mass of axillary tail of right breast 08/24/2017  . Pain with urination 08/24/2017  . Urinary frequency 08/24/2017  . Hematuria 08/24/2017  . Encounter for well woman exam with routine gynecological exam 01/15/2016  . Benign cyst of right breast 11/29/2015  . Positive fecal occult blood test 07/22/2014  . History of breast cancer 07/22/2014  . Rectal bleeding 07/10/2014  . Lymphedema 06/15/2012  . BRCA1 negative 10/22/2011  . BRCA2 negative 10/22/2011  . Cancer of left breast ([place]) 06/30/2011    Class: Diagnosis of   Donato Heinz. Owens Shark PT  Norwood Levo 08/30/2018, 7:14 PM  [place], [place], [address] Phone: [phone]   Fax:  [phone]  Name: REBECCAH IVINS MRN: [id number] Date of Birth: 1971-05-23

## 2018-08-31 ENCOUNTER — Other Ambulatory Visit: Payer: Self-pay | Admitting: *Deleted

## 2018-08-31 DIAGNOSIS — C50012 Malignant neoplasm of nipple and areola, left female breast: Secondary | ICD-10-CM

## 2018-09-01 ENCOUNTER — Encounter: Payer: Self-pay | Admitting: Adult Health

## 2018-09-01 ENCOUNTER — Ambulatory Visit (INDEPENDENT_AMBULATORY_CARE_PROVIDER_SITE_OTHER): Payer: BLUE CROSS/BLUE SHIELD | Admitting: Adult Health

## 2018-09-01 ENCOUNTER — Other Ambulatory Visit (HOSPITAL_COMMUNITY)
Admission: RE | Admit: 2018-09-01 | Discharge: 2018-09-01 | Disposition: A | Discharge status: Home / Self Care | Payer: BLUE CROSS/BLUE SHIELD | Source: Ambulatory Visit | Attending: Adult Health | Admitting: Adult Health

## 2018-09-01 VITALS — BP 150/110 | HR 97 | Ht 65.0 in | Wt 232.0 lb

## 2018-09-01 DIAGNOSIS — Z1212 Encounter for screening for malignant neoplasm of rectum: Secondary | ICD-10-CM | POA: Diagnosis not present

## 2018-09-01 DIAGNOSIS — Z1211 Encounter for screening for malignant neoplasm of colon: Secondary | ICD-10-CM

## 2018-09-01 DIAGNOSIS — Z853 Personal history of malignant neoplasm of breast: Secondary | ICD-10-CM

## 2018-09-01 DIAGNOSIS — Z01419 Encounter for gynecological examination (general) (routine) without abnormal findings: Secondary | ICD-10-CM

## 2018-09-01 DIAGNOSIS — I10 Essential (primary) hypertension: Secondary | ICD-10-CM

## 2018-09-01 LAB — HEMOCCULT GUIAC POC 1CARD (OFFICE): FECAL OCCULT BLD: NEGATIVE

## 2018-09-01 MED ORDER — HYDROCHLOROTHIAZIDE 12.5 MG PO CAPS: 12.5000 mg | ORAL_CAPSULE | Freq: Every day | ORAL | 6 refills | Status: DC

## 2018-09-01 NOTE — Progress Notes (Addendum)
Patient ID: [name], female   DOB: 01/28/1971, 47 y.o.   MRN: [id number] History of Present Illness: Jessica Simpson is a 47 year old black female in for well woman gyn exam and pap. PCP is [facility].   Current Medications, Allergies, Past Medical History, Past Surgical History, Family History and Social History were reviewed in [facility] record.     Review of Systems: Patient denies any headaches, hearing loss, fatigue, blurred vision, shortness of breath, chest pain, abdominal pain, problems with bowel movements, urination, or intercourse(not active currently). No joint pain or mood swings.    Physical Exam:BP (!) 150/110 (BP Location: Right Leg, Patient Position: Sitting, Cuff Size: Normal)   Pulse 97   Ht 5\' 5"  (1.651 m)   Wt 232 lb (105.2 kg)   LMP 08/07/2018   BMI 38.61 kg/m  General:  Well developed, well nourished, no acute distress Skin:  Warm and dry Neck:  Midline trachea, normal thyroid, good ROM, no lymphadenopathy Lungs; Clear to auscultation bilaterally Breast:  No dominant palpable mass, retraction, or nipple discharge,has well healed scars, has port a cath on right chest Cardiovascular: Regular rate and rhythm Abdomen:  Soft, non tender, no hepatosplenomegaly Pelvic:  External genitalia is normal in appearance, no lesions.  The vagina is normal in appearance. Urethra has no lesions or masses. The cervix is bulbous. Pap with HPV performed. Uterus is felt to be normal size, shape, and contour.  No adnexal masses or tenderness noted.Bladder is non tender, no masses felt. Rectal: Good sphincter tone, no polyps, or hemorrhoids felt.  Hemoccult negative. Extremities/musculoskeletal:  No swelling or varicosities noted, no clubbing or cyanosis Psych:  No mood changes, alert and cooperative,seems happy PHQ 9 score 2. Take 2 norvasc 2 daily and will add Microzide  Examination chaperoned by Jessica Simpson CMA.  Impression:  1.  Encounter for gynecological examination with Papanicolaou smear of cervix   2. Screening for colorectal cancer   3. History of breast cancer   4. Hypertension, unspecified type      Plan: Take 2 norvasc daily  and get BP checked when sees oncologist 09/12/18 Meds ordered this encounter  Medications  . hydrochlorothiazide (MICROZIDE) 12.5 MG capsule    Sig: Take 1 capsule (12.5 mg total) by mouth daily.    Dispense:  30 capsule    Refill:  6    Order Specific Question:   Supervising Provider    Answer:   Jessica Simpson [[id number]]   Physical in 1 year Pap in 3 if normal Mammogram yearly Labs with PCP  Colonoscopy per GI

## 2018-09-04 ENCOUNTER — Encounter: Payer: Self-pay | Admitting: Rehabilitation

## 2018-09-04 ENCOUNTER — Ambulatory Visit: Payer: BLUE CROSS/BLUE SHIELD | Admitting: Rehabilitation

## 2018-09-04 DIAGNOSIS — I890 Lymphedema, not elsewhere classified: Secondary | ICD-10-CM | POA: Diagnosis not present

## 2018-09-04 DIAGNOSIS — M25612 Stiffness of left shoulder, not elsewhere classified: Secondary | ICD-10-CM

## 2018-09-04 LAB — CYTOLOGY - PAP
ADEQUACY: ABSENT
Diagnosis: NEGATIVE
HPV: NOT DETECTED

## 2018-09-04 NOTE — Patient Instructions (Signed)
Access Code: [facility]  URL: [email or web address]  Date: 09/04/2018  Prepared by: Shan Levans   Exercises  Supine Lower Trunk Rotation - 10 reps - 1 sets - 6seconds hold - 1x daily - 7x weekly  Hip Flexion Stretch - 3 reps - 1 sets - 20seconds hold - 1x daily - 7x weekly  Supine Double Knee to Chest - 3 reps - 1 sets - 20 seconds hold - 1x daily - 7x weekly
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
## 2018-09-06 ENCOUNTER — Ambulatory Visit: Payer: BLUE CROSS/BLUE SHIELD | Admitting: Physical Therapy

## 2018-09-12 ENCOUNTER — Inpatient Hospital Stay: Payer: BLUE CROSS/BLUE SHIELD

## 2018-09-12 ENCOUNTER — Inpatient Hospital Stay (HOSPITAL_BASED_OUTPATIENT_CLINIC_OR_DEPARTMENT_OTHER): Payer: BLUE CROSS/BLUE SHIELD | Admitting: Hematology and Oncology

## 2018-09-12 ENCOUNTER — Inpatient Hospital Stay: Payer: BLUE CROSS/BLUE SHIELD | Attending: Hematology and Oncology

## 2018-09-12 ENCOUNTER — Telehealth: Payer: Self-pay | Admitting: Hematology and Oncology

## 2018-09-12 VITALS — BP 151/101

## 2018-09-12 DIAGNOSIS — D709 Neutropenia, unspecified: Secondary | ICD-10-CM | POA: Insufficient documentation

## 2018-09-12 DIAGNOSIS — C50012 Malignant neoplasm of nipple and areola, left female breast: Secondary | ICD-10-CM

## 2018-09-12 DIAGNOSIS — R110 Nausea: Secondary | ICD-10-CM | POA: Diagnosis not present

## 2018-09-12 DIAGNOSIS — N6331 Unspecified lump in axillary tail of the right breast: Secondary | ICD-10-CM

## 2018-09-12 DIAGNOSIS — C50612 Malignant neoplasm of axillary tail of left female breast: Secondary | ICD-10-CM | POA: Diagnosis present

## 2018-09-12 DIAGNOSIS — Z95828 Presence of other vascular implants and grafts: Secondary | ICD-10-CM

## 2018-09-12 DIAGNOSIS — Z923 Personal history of irradiation: Secondary | ICD-10-CM

## 2018-09-12 DIAGNOSIS — Z5111 Encounter for antineoplastic chemotherapy: Secondary | ICD-10-CM | POA: Insufficient documentation

## 2018-09-12 DIAGNOSIS — C773 Secondary and unspecified malignant neoplasm of axilla and upper limb lymph nodes: Secondary | ICD-10-CM | POA: Insufficient documentation

## 2018-09-12 DIAGNOSIS — R5383 Other fatigue: Secondary | ICD-10-CM | POA: Diagnosis not present

## 2018-09-12 DIAGNOSIS — Z9221 Personal history of antineoplastic chemotherapy: Secondary | ICD-10-CM

## 2018-09-12 DIAGNOSIS — C50611 Malignant neoplasm of axillary tail of right female breast: Secondary | ICD-10-CM | POA: Diagnosis not present

## 2018-09-12 DIAGNOSIS — Z79899 Other long term (current) drug therapy: Secondary | ICD-10-CM

## 2018-09-12 DIAGNOSIS — Z171 Estrogen receptor negative status [ER-]: Secondary | ICD-10-CM | POA: Diagnosis not present

## 2018-09-12 DIAGNOSIS — R51 Headache: Secondary | ICD-10-CM

## 2018-09-12 LAB — CMP (CANCER CENTER ONLY)
ALBUMIN: 3.3 g/dL — AB (ref 3.5–5.0)
ALT: 14 U/L (ref 0–44)
AST: 10 U/L — AB (ref 15–41)
Alkaline Phosphatase: 101 U/L (ref 38–126)
Anion gap: 10 (ref 5–15)
BILIRUBIN TOTAL: 0.3 mg/dL (ref 0.3–1.2)
BUN: 13 mg/dL (ref 6–20)
CHLORIDE: 106 mmol/L (ref 98–111)
CO2: 25 mmol/L (ref 22–32)
Calcium: 9.1 mg/dL (ref 8.9–10.3)
Creatinine: 0.92 mg/dL (ref 0.44–1.00)
GFR, Est AFR Am: >60 mL/min (ref >60)
GFR, Estimated: >60 mL/min (ref >60)
Glucose, Bld: 139 mg/dL — ABNORMAL HIGH (ref 70–99)
POTASSIUM: 3.5 mmol/L (ref 3.5–5.1)
Sodium: 141 mmol/L (ref 135–145)
Total Protein: 6.8 g/dL (ref 6.5–8.1)

## 2018-09-12 LAB — CBC WITH DIFFERENTIAL (CANCER CENTER ONLY)
ABS IMMATURE GRANULOCYTES: 0.17 10*3/uL — AB (ref 0.00–0.07)
Basophils Absolute: 0 10*3/uL (ref 0.0–0.1)
Basophils Relative: 1 %
Eosinophils Absolute: 0.1 10*3/uL (ref 0.0–0.5)
Eosinophils Relative: 3 %
HEMATOCRIT: 33.2 % — AB (ref 36.0–46.0)
HEMOGLOBIN: 10.7 g/dL — AB (ref 12.0–15.0)
Immature Granulocytes: 4 %
LYMPHS PCT: 15 %
Lymphs Abs: 0.6 10*3/uL — ABNORMAL LOW (ref 0.7–4.0)
MCH: 27 pg (ref 26.0–34.0)
MCHC: 32.2 g/dL (ref 30.0–36.0)
MCV: 83.8 fL (ref 80.0–100.0)
MONO ABS: 0.7 10*3/uL (ref 0.1–1.0)
MONOS PCT: 17 %
NEUTROS ABS: 2.5 10*3/uL (ref 1.7–7.7)
Neutrophils Relative %: 60 %
Platelet Count: 211 10*3/uL (ref 150–400)
RBC: 3.96 MIL/uL (ref 3.87–5.11)
RDW: 15.1 % (ref 11.5–15.5)
WBC Count: 4.1 10*3/uL (ref 4.0–10.5)
nRBC: 0 % (ref 0.0–0.2)

## 2018-09-12 MED ORDER — SODIUM CHLORIDE 0.9 % IV SOLN
Freq: Once | INTRAVENOUS | Status: AC
  Administered 2018-09-12: 10:00:00 via INTRAVENOUS
  Filled 2018-09-12: qty 250

## 2018-09-12 MED ORDER — DEXAMETHASONE SODIUM PHOSPHATE 10 MG/ML IJ SOLN
10.0000 mg | Freq: Once | INTRAMUSCULAR | Status: AC
  Administered 2018-09-12: 10 mg via INTRAVENOUS

## 2018-09-12 MED ORDER — SODIUM CHLORIDE 0.9 % IV SOLN
600.0000 mg/m2 | Freq: Once | INTRAVENOUS | Status: AC
  Administered 2018-09-12: 1300 mg via INTRAVENOUS
  Filled 2018-09-12: qty 65

## 2018-09-12 MED ORDER — SODIUM CHLORIDE 0.9% FLUSH
10.0000 mL | Freq: Once | INTRAVENOUS | Status: AC
  Administered 2018-09-12: 10 mL
  Filled 2018-09-12: qty 10

## 2018-09-12 MED ORDER — FLUOROURACIL CHEMO INJECTION 2.5 GM/50ML
600.0000 mg/m2 | Freq: Once | INTRAVENOUS | Status: AC
  Administered 2018-09-12: 1300 mg via INTRAVENOUS
  Filled 2018-09-12: qty 26

## 2018-09-12 MED ORDER — METHOTREXATE SODIUM (PF) CHEMO INJECTION 250 MG/10ML
39.9000 mg/m2 | Freq: Once | INTRAMUSCULAR | Status: AC
  Administered 2018-09-12: 87 mg via INTRAVENOUS
  Filled 2018-09-12: qty 3.48

## 2018-09-12 MED ORDER — DEXAMETHASONE SODIUM PHOSPHATE 10 MG/ML IJ SOLN
INTRAMUSCULAR | Status: AC
  Filled 2018-09-12: qty 1

## 2018-09-12 MED ORDER — HEPARIN SOD (PORK) LOCK FLUSH 100 UNIT/ML IV SOLN
500.0000 [IU] | Freq: Once | INTRAVENOUS | Status: AC | PRN
  Administered 2018-09-12: 500 [IU]
  Filled 2018-09-12: qty 5

## 2018-09-12 MED ORDER — SODIUM CHLORIDE 0.9% FLUSH
10.0000 mL | INTRAVENOUS | Status: DC | PRN
  Administered 2018-09-12: 10 mL
  Filled 2018-09-12: qty 10

## 2018-09-12 MED ORDER — PALONOSETRON HCL INJECTION 0.25 MG/5ML
0.2500 mg | Freq: Once | INTRAVENOUS | Status: AC
  Administered 2018-09-12: 0.25 mg via INTRAVENOUS

## 2018-09-12 MED ORDER — PALONOSETRON HCL INJECTION 0.25 MG/5ML
INTRAVENOUS | Status: AC
  Filled 2018-09-12: qty 5

## 2018-09-12 NOTE — Assessment & Plan Note (Signed)
Prior left breast cancer 2012: Treated with neoadjuvant FEC-Taxotere status post lumpectomy radiation. Recurrence: Right axillary lymph node. Neoadjuvant chemo carboplatin and gemcitabine x6 cycles Right axillary lymph node dissection 1/11 lymph nodes positive Adjuvant radiation with Xelodacompleted 06/19/18  Current treatment:Cycle4day1CMF adj chemo Labs reviewed: No evidence of neutropenia. We will maintain the same dosage.  Chemo Toxicities: 1.Headache:  2.mild nausea: Did not require any nausea medication at home. 3.  Fatigue that lasts for 1 to 2 days 4.  Neutropenia: ANC 1.5, I discussed with her that if her [place] does not stay up then we may have to reduce her dosage on and hold treatment.  Today she can proceed with her chemotherapy as planned.  Labs have been reviewed Monitoring closely for chemotherapy side effects.  RTC in3weeks for cycle 5

## 2018-09-12 NOTE — [facility] Notes (Signed)
Ok to treat with BP 150/101, taken on lower leg per Dr.Gudena. Pt on BP medication at home and is currently asymptomatic. RN in infusion to recheck and monitor BP.

## 2018-09-12 NOTE — [facility] Notes (Signed)
Patient Care Team: Patient, [name] as PCP - General ([place]) Neldon Mc, MD as Surgeon (General Surgery) Everardo All, MD (Hematology and Oncology)  DIAGNOSIS:    ICD-10-CM   1. 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, unspecified estrogen receptor status ([place]) C50.012     SUMMARY OF ONCOLOGIC HISTORY:   Cancer of left breast ([place])   06/30/2011 Initial Diagnosis    Breast cancer, IDC, Left, Stage III, Triple negative, 7.6 cm breast mass and palpable axillary mass    09/06/2011 Miscellaneous    BRCA 1 and 2: Negative    09/14/2011 - 11/23/2011 Neo-Adjuvant Chemotherapy    Dose dense FEC followed by dose dense Taxotere    12/21/2011 Surgery    Left lumpectomy: High-grade poorly differentiated IDC 1.7 cm with high-grade DCIS, margins negative, 3/7 lymph nodes positive, ER 0%, PR 0%, HER-2 negative ratio 1.33 T1CN1 stage IIb    12/31/2011 - 02/15/2012 Radiation Therapy    Radiation at [facility]    09/26/2017 Relapse/Recurrence    Left breast upper outer quadrant within the lumpectomy bed: Fibrosis no malignancy, right axillary lymph node biopsy metastatic high-grade carcinoma ER 0%, PR 0%, Ki-67 90%, HER-2 negative ratio 1.11 (similar to previous ductal carcinoma)    11/14/2017 - 03/06/2018 Neo-Adjuvant Chemotherapy    Neo-Adjuvant chemotherapy with Gemzar and carboplatin days 1 and 8 every 3 weeks    01/10/2018 Genetic Testing    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  The Hereditary Gene Panel offered by [facility] includes sequencing and/or deletion duplication testing of the following 47 genes: APC, ATM, AXIN2, BARD1, BMPR1A, BRCA1, BRCA2, BRIP1, CDH1, CDK4, CDKN2A (p14ARF), CDKN2A (p16INK4a), CHEK2, CTNNA1, DICER1, EPCAM (Deletion/duplication testing only), GREM1 (promoter region deletion/duplication testing only), KIT, MEN1, MLH1, MSH2, MSH3, MSH6, MUTYH, NBN, NF1, NHTL1, PALB2, PDGFRA, PMS2, POLD1, POLE, PTEN,  RAD50, RAD51C, RAD51D, SDHB, SDHC, SDHD, SMAD4, SMARCA4. STK11, TP53, TSC1, TSC2, and VHL.  The following genes were evaluated for sequence changes only: SDHA and HOXB13 c.251G>A variant only. The report date is January 10, 2018.      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 ([place])   04/06/2018 Surgery    Right axillary lymph node dissection: 1/11 node positive for metastatic high-grade carcinoma    04/25/2018 Initial Diagnosis    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 ([place])    04/25/2018 Cancer Staging    Staging form: Breast, AJCC 8th Edition - Clinical: Stage IIB (cT0, cN1, cM0, G3, ER-, PR-, HER2-) - Signed by Eppie Gibson, MD on 04/25/2018    05/16/2018 - 06/22/2018 Radiation Therapy    Adjuvant radiation therapy with Xeloda    07/11/2018 -  Chemotherapy    Adjuvant chemotherapy with CMF      CHIEF COMPLIANT: Follow up for Cycle 4 of CMF  INTERVAL HISTORY: Jessica Simpson is a 47 y.o. with above-mentioned history of initial left breast cancer now right breast cancer with recurrence for which she is on adjuvant chemotherapy with CMF. Today is cycle 4 of treatment.  She presented to the clinic today alone. She notes she is tolerating chemotherapy well. She denies nausea, and notes tolerable constipation and diarrhea that does not last more than a day. Her Hg is 10.7, platelets 211. She is stable and ready for cycle 4 of treatment today. Her last CMF is 10/24/18. She will get her next mammogram and [place] on 09/27/18. She notes her concern about whole body scans, but  guidelines do not require.    REVIEW OF SYSTEMS:  Constitutional: Denies fevers, chills or abnormal weight loss Eyes: Denies blurriness of vision Ears, nose, mouth, throat, and face: Denies mucositis or sore throat Respiratory: Denies cough, dyspnea or wheezes Cardiovascular: Denies palpitation, chest discomfort Gastrointestinal:  Denies nausea, heartburn or change in bowel habits Skin: Denies abnormal skin  rashes Lymphatics: Denies new lymphadenopathy or easy bruising Neurological:Denies numbness, tingling or new weaknesses Behavioral/Psych: Mood is stable, no new changes  Extremities: No lower extremity edema Breast: denies any pain or lumps or nodules in either breasts All other systems were reviewed with the patient and are negative.  I have reviewed the past medical history, past surgical history, social history and family history with the patient and they are unchanged from previous note.  ALLERGIES:  is allergic to codeine and lortab [hydrocodone-acetaminophen].  MEDICATIONS:  Current Outpatient Medications  Medication Sig Dispense Refill  . acetaminophen (TYLENOL) 500 MG tablet Take 500 mg by mouth every 6 (six) hours as needed. For pain     . amLODipine (NORVASC) 5 MG tablet Take 5 mg by mouth daily.  6  . Cholecalciferol 5000 units capsule Take 1 capsule (5,000 Units total) by mouth daily.    Marland Kitchen gabapentin (NEURONTIN) 100 MG capsule Take 100 mg by mouth at bedtime.    . hydrochlorothiazide (MICROZIDE) 12.5 MG capsule Take 1 capsule (12.5 mg total) by mouth daily. 30 capsule 6  . ibuprofen (ADVIL,MOTRIN) 400 MG tablet Take 400 mg by mouth every 8 (eight) hours as needed.    . lidocaine-prilocaine (EMLA) cream Apply to affected area once 30 g 3  . loratadine (CLARITIN) 10 MG tablet Take 10 mg by mouth daily as needed for allergies.     Marland Kitchen LORazepam (ATIVAN) 0.5 MG tablet Take 1 tablet (0.5 mg total) by mouth every 6 (six) hours as needed (Nausea or vomiting). 30 tablet 0  . methocarbamol (ROBAXIN) 500 MG tablet Take 1 tablet (500 mg total) by mouth every 8 (eight) hours as needed for muscle spasms. 20 tablet 0  . Multiple Vitamin (MULTIVITAMIN) tablet Take 1 tablet by mouth daily.    . ondansetron (ZOFRAN) 8 MG tablet Take 1 tablet (8 mg total) by mouth 2 (two) times daily as needed for refractory nausea / vomiting. Start on day 3 after chemotherapy. 30 tablet 1  . oxyCODONE (OXY  IR/ROXICODONE) 5 MG immediate release tablet Take 1 tablet (5 mg total) by mouth every 6 (six) hours as needed for moderate pain. 15 tablet 0  . prochlorperazine (COMPAZINE) 10 MG tablet Take 1 tablet (10 mg total) by mouth every 6 (six) hours as needed (Nausea or vomiting). 30 tablet 1  . promethazine (PHENERGAN) 12.5 MG tablet Take 1 tablet (12.5 mg total) by mouth every 6 (six) hours as needed for nausea or vomiting. 10 tablet 0  . traMADol (ULTRAM) 50 MG tablet Take 2 tablets (100 mg total) by mouth every 6 (six) hours as needed. 10 tablet 0  . vitamin B-12 (CYANOCOBALAMIN) 500 MCG tablet Take 500 mcg by mouth daily.     No current facility-administered medications for this visit.    Facility-Administered Medications Ordered in Other Visits  Medication Dose Route Frequency Provider Last Rate Last Dose  . lactated ringers infusion    Continuous PRN Kerby Less, CRNA        PHYSICAL EXAMINATION: ECOG PERFORMANCE STATUS: 1 - Symptomatic but completely ambulatory  Vitals:   09/12/18 0920  BP: (!) 188/95  Pulse: 87  Resp: 18  Temp: 98.2 F (36.8 C)  SpO2: 100%   Filed Weights   09/12/18 0920  Weight: 232 lb 9.6 oz (105.5 kg)    GENERAL:alert, no distress and comfortable SKIN: skin color, texture, turgor are normal, no rashes or significant lesions EYES: normal, Conjunctiva are pink and non-injected, sclera clear OROPHARYNX:no exudate, no erythema and lips, buccal mucosa, and tongue normal  NECK: supple, thyroid normal size, non-tender, without nodularity LYMPH:  no palpable lymphadenopathy in the cervical, axillary or inguinal LUNGS: clear to auscultation and percussion with normal breathing effort HEART: regular rate & rhythm and no murmurs and no lower extremity edema ABDOMEN:abdomen soft, non-tender and normal bowel sounds MUSCULOSKELETAL:no cyanosis of digits and no clubbing  NEURO: alert & oriented x 3 with fluent speech, no focal motor/sensory  deficits EXTREMITIES: No lower extremity edema   LABORATORY DATA:  I have reviewed the data as listed CMP Latest Ref Rng & Units 08/22/2018 08/01/2018 07/18/2018  Glucose 70 - 99 mg/dL 121(H) 109(H) 113(H)  BUN 6 - 20 mg/dL 12 15 12   Creatinine 0.44 - 1.00 mg/dL 1.00 0.93 0.85  Sodium 135 - 145 mmol/L 141 141 136  Potassium 3.5 - 5.1 mmol/L 3.6 3.9 3.5  Chloride 98 - 111 mmol/L 105 104 102  CO2 22 - 32 mmol/L 27 28 25   Calcium 8.9 - 10.3 mg/dL 9.3 9.5 9.2  Total Protein 6.5 - 8.1 g/dL 7.1 7.4 7.4  Total Bilirubin 0.3 - 1.2 mg/dL 0.4 0.3 0.3  Alkaline Phos 38 - 126 U/L 108 112 98  AST 15 - 41 U/L 15 15 11(L)  ALT 0 - 44 U/L 16 17 13     Lab Results  Component Value Date   WBC 4.1 09/12/2018   HGB 10.7 (L) 09/12/2018   HCT 33.2 (L) 09/12/2018   MCV 83.8 09/12/2018   PLT 211 09/12/2018   NEUTROABS 2.5 09/12/2018    ASSESSMENT & PLAN:  Cancer of left breast ([place]) Prior left breast cancer 2012: Treated with neoadjuvant FEC-Taxotere status post lumpectomy radiation. Recurrence: Right axillary lymph node. Neoadjuvant chemo carboplatin and gemcitabine x6 cycles Right axillary lymph node dissection 1/11 lymph nodes positive Adjuvant radiation with Xelodacompleted 06/19/18  Current treatment:Cycle4day1CMF adj chemo Labs reviewed: No evidence of neutropenia. We will maintain the same dosage.  Chemo Toxicities: 1.Headache:  2.mild nausea: Did not require any nausea medication at home. 3.  Fatigue that lasts for 1 to 2 days 4.  Neutropenia: ANC 1.5, I discussed with her that if her [place] does not stay up then we may have to reduce her dosage on and hold treatment.  Today she can proceed with her chemotherapy as planned.  Labs have been reviewed Monitoring closely for chemotherapy side effects.  We briefly discussed with her that CT scans are not routinely indicated unless she has symptoms. RTC in3weeks for cycle 5    No orders of the defined types were placed in  this encounter.  The patient has a good understanding of the overall plan. she agrees with it. she will call with any problems that may develop before the next visit here.  Nicholas Lose, MD 09/12/2018   I, Cloyde Reams Dorshimer, am acting as scribe for Nicholas Lose, MD.  I have reviewed the above documentation for accuracy and completeness, and I agree with the above.

## 2018-09-12 NOTE — Telephone Encounter (Signed)
Printed medical records for [employer], Release ID: [id number]

## 2018-09-12 NOTE — Telephone Encounter (Signed)
No 11/19 los orders. °

## 2018-09-12 NOTE — Patient Instructions (Signed)
[facility] Discharge Instructions for Patients Receiving Chemotherapy  Today you received the following chemotherapy agents: cyclophosphamide (Cytoxan), methotrexate (PF), and fluorouracil (Adrucil/5FU)  To help prevent nausea and vomiting after your treatment, we encourage you to take your nausea medication as directed.    If you develop nausea and vomiting that is not controlled by your nausea medication, call the clinic.   BELOW ARE SYMPTOMS THAT SHOULD BE REPORTED IMMEDIATELY:  *FEVER GREATER THAN 100.5 F  *CHILLS WITH OR WITHOUT FEVER  NAUSEA AND VOMITING THAT IS NOT CONTROLLED WITH YOUR NAUSEA MEDICATION  *UNUSUAL SHORTNESS OF BREATH  *UNUSUAL BRUISING OR BLEEDING  TENDERNESS IN MOUTH AND THROAT WITH OR WITHOUT PRESENCE OF ULCERS  *URINARY PROBLEMS  *BOWEL PROBLEMS  UNUSUAL RASH Items with * indicate a potential emergency and should be followed up as soon as possible.  Feel free to call the clinic should you have any questions or concerns. The clinic phone number is [phone].  Please show the [place] at check-in to the Emergency Department and triage nurse.

## 2018-09-27 ENCOUNTER — Ambulatory Visit
Admission: RE | Admit: 2018-09-27 | Discharge: 2018-09-27 | Disposition: A | Discharge status: Home / Self Care | Payer: BLUE CROSS/BLUE SHIELD | Source: Ambulatory Visit | Attending: Hematology and Oncology | Admitting: Hematology and Oncology

## 2018-09-27 ENCOUNTER — Ambulatory Visit: Payer: BLUE CROSS/BLUE SHIELD

## 2018-09-27 DIAGNOSIS — Z853 Personal history of malignant neoplasm of breast: Secondary | ICD-10-CM

## 2018-10-03 ENCOUNTER — Inpatient Hospital Stay: Payer: BLUE CROSS/BLUE SHIELD

## 2018-10-03 ENCOUNTER — Inpatient Hospital Stay (HOSPITAL_BASED_OUTPATIENT_CLINIC_OR_DEPARTMENT_OTHER): Payer: BLUE CROSS/BLUE SHIELD | Admitting: Adult Health

## 2018-10-03 ENCOUNTER — Encounter: Payer: Self-pay | Admitting: Adult Health

## 2018-10-03 ENCOUNTER — Inpatient Hospital Stay: Payer: BLUE CROSS/BLUE SHIELD | Attending: Hematology and Oncology

## 2018-10-03 VITALS — BP 183/111 | HR 87 | Temp 97.9°F | Ht 65.0 in | Wt 231.2 lb

## 2018-10-03 VITALS — BP 181/75 | HR 73

## 2018-10-03 DIAGNOSIS — R5383 Other fatigue: Secondary | ICD-10-CM | POA: Diagnosis not present

## 2018-10-03 DIAGNOSIS — Z923 Personal history of irradiation: Secondary | ICD-10-CM | POA: Diagnosis not present

## 2018-10-03 DIAGNOSIS — C50012 Malignant neoplasm of nipple and areola, left female breast: Secondary | ICD-10-CM

## 2018-10-03 DIAGNOSIS — C50611 Malignant neoplasm of axillary tail of right female breast: Secondary | ICD-10-CM | POA: Insufficient documentation

## 2018-10-03 DIAGNOSIS — Z9221 Personal history of antineoplastic chemotherapy: Secondary | ICD-10-CM

## 2018-10-03 DIAGNOSIS — Z79899 Other long term (current) drug therapy: Secondary | ICD-10-CM | POA: Insufficient documentation

## 2018-10-03 DIAGNOSIS — M545 Low back pain: Secondary | ICD-10-CM | POA: Diagnosis not present

## 2018-10-03 DIAGNOSIS — R110 Nausea: Secondary | ICD-10-CM | POA: Insufficient documentation

## 2018-10-03 DIAGNOSIS — I10 Essential (primary) hypertension: Secondary | ICD-10-CM | POA: Insufficient documentation

## 2018-10-03 DIAGNOSIS — Z853 Personal history of malignant neoplasm of breast: Secondary | ICD-10-CM | POA: Diagnosis not present

## 2018-10-03 DIAGNOSIS — N6331 Unspecified lump in axillary tail of the right breast: Secondary | ICD-10-CM

## 2018-10-03 DIAGNOSIS — Z5111 Encounter for antineoplastic chemotherapy: Secondary | ICD-10-CM | POA: Diagnosis not present

## 2018-10-03 DIAGNOSIS — Z171 Estrogen receptor negative status [ER-]: Secondary | ICD-10-CM | POA: Insufficient documentation

## 2018-10-03 DIAGNOSIS — Z803 Family history of malignant neoplasm of breast: Secondary | ICD-10-CM | POA: Insufficient documentation

## 2018-10-03 DIAGNOSIS — Z801 Family history of malignant neoplasm of trachea, bronchus and lung: Secondary | ICD-10-CM | POA: Diagnosis not present

## 2018-10-03 DIAGNOSIS — Z95828 Presence of other vascular implants and grafts: Secondary | ICD-10-CM

## 2018-10-03 LAB — CBC WITH DIFFERENTIAL (CANCER CENTER ONLY)
Abs Immature Granulocytes: 0.05 10*3/uL (ref 0.00–0.07)
BASOS PCT: 0 %
Basophils Absolute: 0 10*3/uL (ref 0.0–0.1)
EOS ABS: 0.1 10*3/uL (ref 0.0–0.5)
Eosinophils Relative: 5 %
HCT: 35.5 % — ABNORMAL LOW (ref 36.0–46.0)
Hemoglobin: 11.3 g/dL — ABNORMAL LOW (ref 12.0–15.0)
Immature Granulocytes: 2 %
Lymphocytes Relative: 22 %
Lymphs Abs: 0.6 10*3/uL — ABNORMAL LOW (ref 0.7–4.0)
MCH: 26.8 pg (ref 26.0–34.0)
MCHC: 31.8 g/dL (ref 30.0–36.0)
MCV: 84.3 fL (ref 80.0–100.0)
MONO ABS: 0.5 10*3/uL (ref 0.1–1.0)
Monocytes Relative: 17 %
Neutro Abs: 1.6 10*3/uL — ABNORMAL LOW (ref 1.7–7.7)
Neutrophils Relative %: 54 %
PLATELETS: 226 10*3/uL (ref 150–400)
RBC: 4.21 MIL/uL (ref 3.87–5.11)
RDW: 14 % (ref 11.5–15.5)
WBC: 3 10*3/uL — AB (ref 4.0–10.5)
nRBC: 0 % (ref 0.0–0.2)

## 2018-10-03 LAB — CMP (CANCER CENTER ONLY)
ALK PHOS: 109 U/L (ref 38–126)
ALT: 20 U/L (ref 0–44)
ANION GAP: 8 (ref 5–15)
AST: 17 U/L (ref 15–41)
Albumin: 3.7 g/dL (ref 3.5–5.0)
BILIRUBIN TOTAL: 0.3 mg/dL (ref 0.3–1.2)
BUN: 12 mg/dL (ref 6–20)
CALCIUM: 9.6 mg/dL (ref 8.9–10.3)
CO2: 27 mmol/L (ref 22–32)
Chloride: 106 mmol/L (ref 98–111)
Creatinine: 0.98 mg/dL (ref 0.44–1.00)
GFR, Est AFR Am: >60 mL/min (ref >60)
Glucose, Bld: 131 mg/dL — ABNORMAL HIGH (ref 70–99)
Potassium: 3.5 mmol/L (ref 3.5–5.1)
Sodium: 141 mmol/L (ref 135–145)
TOTAL PROTEIN: 7.3 g/dL (ref 6.5–8.1)

## 2018-10-03 MED ORDER — METHOTREXATE SODIUM (PF) CHEMO INJECTION 250 MG/10ML
39.9000 mg/m2 | Freq: Once | INTRAMUSCULAR | Status: AC
  Administered 2018-10-03: 87 mg via INTRAVENOUS
  Filled 2018-10-03: qty 3.48

## 2018-10-03 MED ORDER — DEXAMETHASONE SODIUM PHOSPHATE 10 MG/ML IJ SOLN
10.0000 mg | Freq: Once | INTRAMUSCULAR | Status: AC
  Administered 2018-10-03: 10 mg via INTRAVENOUS

## 2018-10-03 MED ORDER — PALONOSETRON HCL INJECTION 0.25 MG/5ML
0.2500 mg | Freq: Once | INTRAVENOUS | Status: AC
  Administered 2018-10-03: 0.25 mg via INTRAVENOUS

## 2018-10-03 MED ORDER — SODIUM CHLORIDE 0.9% FLUSH
10.0000 mL | INTRAVENOUS | Status: DC | PRN
  Administered 2018-10-03: 10 mL
  Filled 2018-10-03: qty 10

## 2018-10-03 MED ORDER — FLUOROURACIL CHEMO INJECTION 2.5 GM/50ML
600.0000 mg/m2 | Freq: Once | INTRAVENOUS | Status: AC
  Administered 2018-10-03: 1300 mg via INTRAVENOUS
  Filled 2018-10-03: qty 26

## 2018-10-03 MED ORDER — SODIUM CHLORIDE 0.9% FLUSH
10.0000 mL | Freq: Once | INTRAVENOUS | Status: AC
  Administered 2018-10-03: 10 mL
  Filled 2018-10-03: qty 10

## 2018-10-03 MED ORDER — HEPARIN SOD (PORK) LOCK FLUSH 100 UNIT/ML IV SOLN
500.0000 [IU] | Freq: Once | INTRAVENOUS | Status: AC | PRN
  Administered 2018-10-03: 500 [IU]
  Filled 2018-10-03: qty 5

## 2018-10-03 MED ORDER — SODIUM CHLORIDE 0.9 % IV SOLN
600.0000 mg/m2 | Freq: Once | INTRAVENOUS | Status: AC
  Administered 2018-10-03: 1300 mg via INTRAVENOUS
  Filled 2018-10-03: qty 65

## 2018-10-03 MED ORDER — PALONOSETRON HCL INJECTION 0.25 MG/5ML
INTRAVENOUS | Status: AC
  Filled 2018-10-03: qty 5

## 2018-10-03 MED ORDER — SODIUM CHLORIDE 0.9 % IV SOLN
Freq: Once | INTRAVENOUS | Status: AC
  Administered 2018-10-03: 12:00:00 via INTRAVENOUS
  Filled 2018-10-03: qty 250

## 2018-10-03 MED ORDER — DEXAMETHASONE SODIUM PHOSPHATE 10 MG/ML IJ SOLN
INTRAMUSCULAR | Status: AC
  Filled 2018-10-03: qty 1

## 2018-10-03 NOTE — Progress Notes (Signed)
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 KI-67 of 99%    - Pathologic: No stage assigned - Unsigned Prognostic indicators: Triple negative. KI-67 of 99%      SUMMARY OF ONCOLOGIC HISTORY:   Jessica Simpson of left breast ([place])   06/30/2011 Initial Diagnosis    Breast Jessica Simpson, IDC, Left, Stage III, Triple negative, 7.6 cm breast mass and palpable axillary mass    09/06/2011 Miscellaneous    BRCA 1 and 2: Negative    09/14/2011 - 11/23/2011 Neo-Adjuvant Chemotherapy    Dose dense FEC followed by dose dense Taxotere    12/21/2011 Surgery    Left lumpectomy: High-grade poorly differentiated IDC 1.7 cm with high-grade DCIS, margins negative, 3/7 lymph nodes positive, ER 0%, PR 0%, HER-2 negative ratio 1.33 T1CN1 stage IIb    12/31/2011 - 02/15/2012 Radiation Therapy    Radiation at [facility]    09/26/2017 Relapse/Recurrence    Left breast upper outer quadrant within the lumpectomy bed: Fibrosis no malignancy, right axillary lymph node biopsy metastatic high-grade carcinoma ER 0%, PR 0%, Ki-67 90%, HER-2 negative ratio 1.11 (similar to previous ductal carcinoma)    11/14/2017 - 03/06/2018 Neo-Adjuvant Chemotherapy    Neo-Adjuvant chemotherapy with Gemzar and carboplatin days 1 and 8 every 3 weeks    01/10/2018 Genetic Testing    SDHA c.1375G>C (p.Asp459His) VUS identified on the common hereditary Jessica Simpson panel.  The Hereditary Gene Panel  offered by [facility] includes sequencing and/or deletion duplication testing of the following 47 genes: APC, ATM, AXIN2, BARD1, BMPR1A, BRCA1, BRCA2, BRIP1, CDH1, CDK4, CDKN2A (p14ARF), CDKN2A (p16INK4a), CHEK2, CTNNA1, DICER1, EPCAM (Deletion/duplication testing only), GREM1 (promoter region deletion/duplication testing only), KIT, MEN1, MLH1, MSH2, MSH3, MSH6, MUTYH, NBN, NF1, NHTL1, PALB2, PDGFRA, PMS2, POLD1, POLE, PTEN, RAD50, RAD51C, RAD51D, SDHB, SDHC, SDHD, SMAD4, SMARCA4. STK11, TP53, TSC1, TSC2, and VHL.  The following genes were evaluated for sequence changes only: SDHA and HOXB13 c.251G>A variant only. The report date is January 10, 2018.      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 female returns for evaluation prior to receiving her fifth cycle of CMF.  She is doing well today. She does experience some fatigue and mild nausea from chemotherapy.  She is  otherwise tolerating it well.  She doesn't typically take anti emetics for her nausea.  It resolves on her own.  She sees her PCP about her blood pressure and notes that her blood pressure isn't elevated at that office, that it is only elevated here.  They have contributed it to anxiety around her Jessica Simpson treatments.     Patient Active Problem List   Diagnosis Date Noted  . Hypertension 09/01/2018  . Screening for colorectal  Jessica Simpson 09/01/2018  . Encounter for gynecological examination with Papanicolaou smear of cervix 09/01/2018  . 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 Genetic testing 01/12/2018  . Family history of breast Jessica Simpson   . Port-A-Cath in place 11/21/2017  . Mass of axillary tail of right breast 08/24/2017  . Pain with urination 08/24/2017  . Urinary frequency 08/24/2017  . Hematuria 08/24/2017  . Encounter for well woman exam with routine gynecological exam 01/15/2016  . Benign cyst of right breast 11/29/2015  . Positive fecal occult blood test 07/22/2014  . History of breast Jessica Simpson 07/22/2014  . Rectal bleeding 07/10/2014  . Lymphedema 06/15/2012  . BRCA1 negative 10/22/2011  . BRCA2 negative 10/22/2011  . Jessica Simpson of left breast ([place]) 06/30/2011    Class: Diagnosis of    is allergic to codeine and lortab [hydrocodone-acetaminophen].  MEDICAL HISTORY: Past Medical History:  Diagnosis Date  . BRCA1 negative 10/22/2011  . BRCA2 negative 10/22/2011  . Breast Jessica Simpson, IDC, Left, Stage III, Triple negative 06/30/2011  . Contraceptive education 01/15/2016  . Family history of breast Jessica Simpson   . Fracture    right 4th toe  . History of breast Jessica Simpson 07/22/2014  . History of radiation therapy 05/16/18- 06/22/18   Right Breast/ 50.4 Gy in 28 fractions. Right posterior axilla and SCV nodes/ 50.4 Gy in 28 fractions.   . Hypertension   . Lymphedema 06/15/2012  . Lymphedema of arm   . Personal history of chemotherapy   . Personal history of radiation therapy   . Positive fecal occult blood test 07/22/2014  . S/P radiation therapy 2013   50 gray in 25 fractions to the left breast, supraclavicular, and axillary regions. She then received a boost to the left lumpectomy of 10 gray in 5 fractions. This was given at [place].  . Status post chemotherapy Comp. 11/23/11   FEC and Taxotere    SURGICAL HISTORY: Past Surgical History:  Procedure  Laterality Date  . 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 AXILLARY LYMPH NODE BIOPSY Right 04/06/2018  . AXILLARY LYMPH NODE DISSECTION Right 04/06/2018   Procedure: RIGHT AXILLARY LYMPH NODE DISSECTION;  Surgeon: Rolm Bookbinder, MD;  Location: [place];  Service: General;  Laterality: Right;  . BREAST LUMPECTOMY    . BREAST SURGERY    . CESAREAN SECTION    . COLONOSCOPY N/A 08/14/2014   Procedure: COLONOSCOPY;  Surgeon: Rogene Houston, MD;  [facility];  Service: Endoscopy;  Laterality: N/A;  930  . EVACUATION BREAST HEMATOMA  12/21/2011   Procedure: EVACUATION HEMATOMA BREAST;  Surgeon: Stark Klein, MD;  Location: [place];  Service: General;  Laterality: Left;  . HAND SURGERY     tumor on finger, left hand  . INCISION AND DRAINAGE ABSCESS ANAL     x3  . left breast biopsy with axillary biopsy     core bx  . PORT-A-CATH REMOVAL  12/21/2011   Procedure: REMOVAL  PORT-A-CATH;  Surgeon: Haywood Lasso, MD;  Location: [place];  Service: General;  Laterality: N/A;  . PORTACATH PLACEMENT  08/02/2011   dr Margot Chimes  . PORTACATH PLACEMENT Right 11/14/2017   Procedure: INSERTION PORT-A-CATH WITH [place];  Surgeon: Rolm Bookbinder, MD;  Location: [place];  Service: General;  Laterality: Right;  . removal breast mass  2004   benign  . TREATMENT FISTULA ANAL    . TUMOR REMOVAL     on left hand  . TUMOR REMOVAL     rt. eye    SOCIAL HISTORY: Social History   Socioeconomic History  . Marital status: Single    Spouse name: Not on file  . Number of children: Not on file  . Years of education: Not on file  . Highest education level: Not on file  Occupational History  . Not on file  Social Needs  . Financial resource strain: Not on file  . Food insecurity:    Worry: Not on file    Inability: Not on file  . Transportation needs:    Medical: No    Non-medical: No  Tobacco Use  . Smoking status: Never Smoker  . Smokeless tobacco:  Never Used  Substance and Sexual Activity  . Alcohol use: No  . Drug use: No  . Sexual activity: Not Currently    Birth control/protection: None  Lifestyle  . Physical activity:    Days per week: Not on file    Minutes per session: Not on file  . Stress: Not on file  Relationships  . Social connections:    Talks on phone: Not on file    Gets together: Not on file    Attends religious service: Not on file    Active member of club or organization: Not on file    Attends meetings of clubs or organizations: Not on file    Relationship status: Not on file  . Intimate partner violence:    Fear of current or ex partner: No    Emotionally abused: No    Physically abused: No    Forced sexual activity: No  Other Topics Concern  . Not on file  Social History Narrative  . Not on file    FAMILY HISTORY: Family History  Problem Relation Age of Onset  . Jessica Simpson Father 7       lung Jessica Simpson   . Jessica Simpson Maternal Grandmother        breast  . Jessica Simpson Maternal Grandfather   . Breast Jessica Simpson Other        MGMs mother  . Colon Jessica Simpson Neg Hx     Review of Systems  Constitutional: Positive for fatigue. Negative for appetite change, chills, fever and unexpected weight change.  HENT:   Negative for hearing loss, lump/mass and trouble swallowing.   Eyes: Negative for eye problems and icterus.  Respiratory: Negative for chest tightness, cough and shortness of breath.   Cardiovascular: Negative for chest pain, leg swelling and palpitations.  Gastrointestinal: Positive for nausea. Negative for abdominal distention, abdominal pain, constipation, diarrhea and vomiting.  Endocrine: Negative for hot flashes.  Genitourinary: Negative for difficulty urinating.   Musculoskeletal: Negative for arthralgias.  Skin: Negative for itching and rash.  Neurological: Negative for dizziness, extremity weakness, headaches and numbness.  Hematological: Negative for adenopathy. Does not bruise/bleed easily.   Psychiatric/Behavioral: Negative for depression. The patient is not nervous/anxious.       PHYSICAL EXAMINATION  ECOG PERFORMANCE STATUS: 1 - Symptomatic but  completely ambulatory  Vitals:   10/03/18 1025  BP: (!) 183/111  Pulse: 87  Temp: 97.9 F (36.6 C)  SpO2: 100%    Physical Exam  Constitutional: She is oriented to person, place, and time. She appears well-developed and well-nourished.  HENT:  Head: Normocephalic and atraumatic.  Mouth/Throat: Oropharynx is clear and moist. No oropharyngeal exudate.  Eyes: Pupils are equal, round, and reactive to light. No scleral icterus.  Neck: Neck supple.  Cardiovascular: Normal rate, regular rhythm and normal heart sounds.  Pulmonary/Chest: Effort normal and breath sounds normal.  Abdominal: Soft. Bowel sounds are normal. She exhibits no distension and no mass. There is no tenderness. There is no guarding.  Musculoskeletal: She exhibits no edema.  Lymphadenopathy:    She has no cervical adenopathy.  Neurological: She is alert and oriented to person, place, and time.  Skin: Skin is warm and dry. Capillary refill takes less than 2 seconds. No erythema.  Psychiatric: She has a normal mood and affect.    LABORATORY DATA:  CBC    Component Value Date/Time   WBC 3.0 (L) 10/03/2018 1007   WBC 3.4 (L) 07/11/2018 0952   RBC 4.21 10/03/2018 1007   HGB 11.3 (L) 10/03/2018 1007   HGB 12.2 07/26/2016 1650   HCT 35.5 (L) 10/03/2018 1007   HCT 37.5 07/26/2016 1650   PLT 226 10/03/2018 1007   PLT 294 07/26/2016 1650   MCV 84.3 10/03/2018 1007   MCV 80 07/26/2016 1650   MCH 26.8 10/03/2018 1007   MCHC 31.8 10/03/2018 1007   RDW 14.0 10/03/2018 1007   RDW 14.0 07/26/2016 1650   LYMPHSABS 0.6 (L) 10/03/2018 1007   MONOABS 0.5 10/03/2018 1007   EOSABS 0.1 10/03/2018 1007   BASOSABS 0.0 10/03/2018 1007    CMP     Component Value Date/Time   NA 141 10/03/2018 1007   NA 140 07/26/2016 1650   K 3.5 10/03/2018 1007   CL 106  10/03/2018 1007   CO2 27 10/03/2018 1007   GLUCOSE 131 (H) 10/03/2018 1007   BUN 12 10/03/2018 1007   BUN 13 07/26/2016 1650   CREATININE 0.98 10/03/2018 1007   CALCIUM 9.6 10/03/2018 1007   PROT 7.3 10/03/2018 1007   PROT 7.4 07/26/2016 1650   ALBUMIN 3.7 10/03/2018 1007   ALBUMIN 4.2 07/26/2016 1650   AST 17 10/03/2018 1007   ALT 20 10/03/2018 1007   ALKPHOS 109 10/03/2018 1007   BILITOT 0.3 10/03/2018 1007   GFRNONAA >60 10/03/2018 1007   GFRAA >60 10/03/2018 1007        ASSESSMENT and PLAN:   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 Recurrence: Right axillary lymph node. Neoadjuvant chemo carboplatin and gemcitabine x6 cycles Right axillary lymph node dissection 1/11 lymph nodes positive Adjuvant radiation with Xelodacompleted 06/19/18  Current treatment:Cycle5day1CMF adj chemo  She is tolerating her treatment well.  Labs are stable for treatment.  She will proceed with this today.    We will monitor her blood pressure.  It sounds like it is related to her coming to the Jessica Simpson center, since in other settings she is reportedly normotensive.  She has no symptoms of hypertension today.    She will return in 3 weeks for labs, f/u with Dr. Lindi Adie, and cycle 6 of CMF.       All questions were answered. The patient knows to call the clinic with any problems,  questions or concerns. We can certainly see the patient much sooner if necessary.  A total of (20) minutes of face-to-face time was spent with this patient with greater than 50% of that time in counseling and care-coordination.  This note was electronically signed. Scot Dock, NP 10/03/2018

## 2018-10-03 NOTE — Assessment & Plan Note (Addendum)
Prior left breast cancer 2012: Treated with neoadjuvant FEC-Taxotere status post lumpectomy radiation. Recurrence: Right axillary lymph node. Neoadjuvant chemo carboplatin and gemcitabine x6 cycles Right axillary lymph node dissection 1/11 lymph nodes positive Adjuvant radiation with Xelodacompleted 06/19/18  Current treatment:Cycle5day1CMF adj chemo  She is tolerating her treatment well.  Labs are stable for treatment.  She will proceed with this today.    We will monitor her blood pressure.  It sounds like it is related to her coming to the cancer center, since in other settings she is reportedly normotensive.  She has no symptoms of hypertension today.    She will return in 3 weeks for labs, f/u with Dr. Lindi Adie, and cycle 6 of CMF.

## 2018-10-03 NOTE — Patient Instructions (Signed)
Implanted Port Home Guide An implanted port is a type of central line that is placed under the skin. Central lines are used to provide IV access when treatment or nutrition needs to be given through a person's veins. Implanted ports are used for long-term IV access. An implanted port may be placed because:  You need IV medicine that would be irritating to the small veins in your hands or arms.  You need long-term IV medicines, such as antibiotics.  You need IV nutrition for a long period.  You need frequent blood draws for lab tests.  You need dialysis.  Implanted ports are usually placed in the chest area, but they can also be placed in the upper arm, the abdomen, or the leg. An implanted port has two main parts:  Reservoir. The reservoir is round and will appear as a small, raised area under your skin. The reservoir is the part where a needle is inserted to give medicines or draw blood.  Catheter. The catheter is a thin, flexible tube that extends from the reservoir. The catheter is placed into a large vein. Medicine that is inserted into the reservoir goes into the catheter and then into the vein.  How will I care for my incision site? Do not get the incision site wet. Bathe or shower as directed by your health care provider. How is my port accessed? Special steps must be taken to access the port:  Before the port is accessed, a numbing cream can be placed on the skin. This helps numb the skin over the port site.  Your health care provider uses a sterile technique to access the port. ? Your health care provider must put on a mask and sterile gloves. ? The skin over your port is cleaned carefully with an antiseptic and allowed to dry. ? The port is gently pinched between sterile gloves, and a needle is inserted into the port.  Only "non-coring" port needles should be used to access the port. Once the port is accessed, a blood return should be checked. This helps ensure that the port  is in the vein and is not clogged.  If your port needs to remain accessed for a constant infusion, a clear (transparent) bandage will be placed over the needle site. The bandage and needle will need to be changed every week, or as directed by your health care provider.  Keep the bandage covering the needle clean and dry. Do not get it wet. Follow your health care provider's instructions on how to take a shower or bath while the port is accessed.  If your port does not need to stay accessed, no bandage is needed over the port.  What is flushing? Flushing helps keep the port from getting clogged. Follow your health care provider's instructions on how and when to flush the port. Ports are usually flushed with saline solution or a medicine called heparin. The need for flushing will depend on how the port is used.  If the port is used for intermittent medicines or blood draws, the port will need to be flushed: ? After medicines have been given. ? After blood has been drawn. ? As part of routine maintenance.  If a constant infusion is running, the port may not need to be flushed.  How long will my port stay implanted? The port can stay in for as long as your health care provider thinks it is needed. When it is time for the port to come out, surgery will be   done to remove it. The procedure is similar to the one performed when the port was put in. When should I seek immediate medical care? When you have an implanted port, you should seek immediate medical care if:  You notice a bad smell coming from the incision site.  You have swelling, redness, or drainage at the incision site.  You have more swelling or pain at the port site or the surrounding area.  You have a fever that is not controlled with medicine.  This information is not intended to replace advice given to you by your health care provider. Make sure you discuss any questions you have with your health care provider. Document  Released: 10/11/2005 Document Revised: 03/18/2016 Document Reviewed: 06/18/2013 Elsevier Interactive Patient Education  2017 [employer]vier [employer].  

## 2018-10-03 NOTE — Progress Notes (Signed)
Ok to treat with elevated BP per LC.  Infusion nurse to keep check on BP and report any issues.

## 2018-10-12 NOTE — Progress Notes (Signed)
Patient Care Team: Patient, [name] as PCP - General ([place]) Neldon Mc, MD as Surgeon (General Surgery) Everardo All, MD (Hematology and Oncology)  DIAGNOSIS:    ICD-10-CM   1. 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, unspecified estrogen receptor status ([place]) C50.012     SUMMARY OF ONCOLOGIC HISTORY:   Cancer of left breast ([place])   06/30/2011 Initial Diagnosis    Breast cancer, IDC, Left, Stage III, Triple negative, 7.6 cm breast mass and palpable axillary mass    09/06/2011 Miscellaneous    BRCA 1 and 2: Negative    09/14/2011 - 11/23/2011 Neo-Adjuvant Chemotherapy    Dose dense FEC followed by dose dense Taxotere    12/21/2011 Surgery    Left lumpectomy: High-grade poorly differentiated IDC 1.7 cm with high-grade DCIS, margins negative, 3/7 lymph nodes positive, ER 0%, PR 0%, HER-2 negative ratio 1.33 T1CN1 stage IIb    12/31/2011 - 02/15/2012 Radiation Therapy    Radiation at [facility]    09/26/2017 Relapse/Recurrence    Left breast upper outer quadrant within the lumpectomy bed: Fibrosis no malignancy, right axillary lymph node biopsy metastatic high-grade carcinoma ER 0%, PR 0%, Ki-67 90%, HER-2 negative ratio 1.11 (similar to previous ductal carcinoma)    11/14/2017 - 03/06/2018 Neo-Adjuvant Chemotherapy    Neo-Adjuvant chemotherapy with Gemzar and carboplatin days 1 and 8 every 3 weeks    01/10/2018 Genetic Testing    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  The Hereditary Gene Panel offered by [facility] includes sequencing and/or deletion duplication testing of the following 47 genes: APC, ATM, AXIN2, BARD1, BMPR1A, BRCA1, BRCA2, BRIP1, CDH1, CDK4, CDKN2A (p14ARF), CDKN2A (p16INK4a), CHEK2, CTNNA1, DICER1, EPCAM (Deletion/duplication testing only), GREM1 (promoter region deletion/duplication testing only), KIT, MEN1, MLH1, MSH2, MSH3, MSH6, MUTYH, NBN, NF1, NHTL1, PALB2, PDGFRA, PMS2, POLD1, POLE, PTEN,  RAD50, RAD51C, RAD51D, SDHB, SDHC, SDHD, SMAD4, SMARCA4. STK11, TP53, TSC1, TSC2, and VHL.  The following genes were evaluated for sequence changes only: SDHA and HOXB13 c.251G>A variant only. The report date is January 10, 2018.      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 ([place])   04/06/2018 Surgery    Right axillary lymph node dissection: 1/11 node positive for metastatic high-grade carcinoma    04/25/2018 Initial Diagnosis    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 ([place])    04/25/2018 Cancer Staging    Staging form: Breast, AJCC 8th Edition - Clinical: Stage IIB (cT0, cN1, cM0, G3, ER-, PR-, HER2-) - Signed by Eppie Gibson, MD on 04/25/2018    05/16/2018 - 06/22/2018 Radiation Therapy    Adjuvant radiation therapy with Xeloda    07/11/2018 -  Chemotherapy    Adjuvant chemotherapy with CMF      CHIEF COMPLIANT: Cycle 6 CMF treatment  INTERVAL HISTORY: Jessica Simpson is a 47 y.o. with above-mentioned history of initial left breast cancer now right breast cancer with recurrence for which she is on adjuvant chemotherapy with CMF. Today is cycle 6 of treatment. She presents to the clinic today and notes it took a little longer to recover from her last treatment. She denies nausea or diarrhea. She notes muscle pain in her right lower back. She expressed desire to have her port removed as soon as possible but agreed to keep it for 6 more months.   REVIEW OF SYSTEMS:   Constitutional: Denies fevers, chills or abnormal weight loss (+) fatigue Eyes: Denies blurriness of vision  Ears, nose, mouth, throat, and face: Denies mucositis or sore throat Respiratory: Denies cough, dyspnea or wheezes Cardiovascular: Denies palpitation, chest discomfort Gastrointestinal:  Denies nausea, heartburn or change in bowel habits Skin: Denies abnormal skin rashes MSK: (+) pain in right lower back Lymphatics: Denies new lymphadenopathy or easy bruising Neurological:Denies numbness, tingling or  new weaknesses Behavioral/Psych: Mood is stable, no new changes  Extremities: No lower extremity edema Breast: denies any pain or lumps or nodules in either breasts All other systems were reviewed with the patient and are negative.  I have reviewed the past medical history, past surgical history, social history and family history with the patient and they are unchanged from previous note.  ALLERGIES:  is allergic to codeine and lortab [hydrocodone-acetaminophen].  MEDICATIONS:  Current Outpatient Medications  Medication Sig Dispense Refill  . acetaminophen (TYLENOL) 500 MG tablet Take 500 mg by mouth every 6 (six) hours as needed. For pain     . amLODipine (NORVASC) 5 MG tablet Take 5 mg by mouth daily.  6  . Cholecalciferol 5000 units capsule Take 1 capsule (5,000 Units total) by mouth daily.    Marland Kitchen gabapentin (NEURONTIN) 100 MG capsule Take 100 mg by mouth at bedtime.    . hydrochlorothiazide (MICROZIDE) 12.5 MG capsule Take 1 capsule (12.5 mg total) by mouth daily. 30 capsule 6  . ibuprofen (ADVIL,MOTRIN) 400 MG tablet Take 400 mg by mouth every 8 (eight) hours as needed.    . lidocaine-prilocaine (EMLA) cream Apply to affected area once 30 g 3  . loratadine (CLARITIN) 10 MG tablet Take 10 mg by mouth daily as needed for allergies.     Marland Kitchen LORazepam (ATIVAN) 0.5 MG tablet Take 1 tablet (0.5 mg total) by mouth every 6 (six) hours as needed (Nausea or vomiting). 30 tablet 0  . methocarbamol (ROBAXIN) 500 MG tablet Take 1 tablet (500 mg total) by mouth every 8 (eight) hours as needed for muscle spasms. 20 tablet 0  . Multiple Vitamin (MULTIVITAMIN) tablet Take 1 tablet by mouth daily.    . ondansetron (ZOFRAN) 8 MG tablet Take 1 tablet (8 mg total) by mouth 2 (two) times daily as needed for refractory nausea / vomiting. Start on day 3 after chemotherapy. 30 tablet 1  . oxyCODONE (OXY IR/ROXICODONE) 5 MG immediate release tablet Take 1 tablet (5 mg total) by mouth every 6 (six) hours as needed  for moderate pain. 15 tablet 0  . prochlorperazine (COMPAZINE) 10 MG tablet Take 1 tablet (10 mg total) by mouth every 6 (six) hours as needed (Nausea or vomiting). 30 tablet 1  . promethazine (PHENERGAN) 12.5 MG tablet Take 1 tablet (12.5 mg total) by mouth every 6 (six) hours as needed for nausea or vomiting. 10 tablet 0  . traMADol (ULTRAM) 50 MG tablet Take 2 tablets (100 mg total) by mouth every 6 (six) hours as needed. 10 tablet 0  . vitamin B-12 (CYANOCOBALAMIN) 500 MCG tablet Take 500 mcg by mouth daily.     No current facility-administered medications for this visit.    Facility-Administered Medications Ordered in Other Visits  Medication Dose Route Frequency Provider Last Rate Last Dose  . lactated ringers infusion    Continuous PRN Kerby Less, CRNA        PHYSICAL EXAMINATION: ECOG PERFORMANCE STATUS: 1 - Symptomatic but completely ambulatory  Vitals:   10/24/18 1132  BP: (!) 193/87  Pulse: 68  Resp: 17  Temp: 97.9 F (36.6 C)  SpO2: 100%   Filed  Weights   10/24/18 1132  Weight: 232 lb 4.8 oz (105.4 kg)    GENERAL:alert, no distress and comfortable SKIN: skin color, texture, turgor are normal, no rashes or significant lesions EYES: normal, Conjunctiva are pink and non-injected, sclera clear OROPHARYNX:no exudate, no erythema and lips, buccal mucosa, and tongue normal  NECK: supple, thyroid normal size, non-tender, without nodularity LYMPH:  no palpable lymphadenopathy in the cervical, axillary or inguinal LUNGS: clear to auscultation and percussion with normal breathing effort HEART: regular rate & rhythm and no murmurs and no lower extremity edema ABDOMEN:abdomen soft, non-tender and normal bowel sounds MUSCULOSKELETAL:no cyanosis of digits and no clubbing  NEURO: alert & oriented x 3 with fluent speech, no focal motor/sensory deficits EXTREMITIES: No lower extremity edema  LABORATORY DATA:  I have reviewed the data as listed CMP Latest Ref Rng &  Units 10/24/2018 10/03/2018 09/12/2018  Glucose 70 - 99 mg/dL 92 131(H) 139(H)  BUN 6 - 20 mg/dL _0 Creatinine 0.44 - 1.00 mg/dL 0.82 0.98 0.92  Sodium 135 - 145 mmol/L 140 141 141  Potassium 3.5 - 5.1 mmol/L 4.0 3.5 3.5  Chloride 98 - 111 mmol/L 104 106 106  CO2 22 - 32 mmol/L _1 Calcium 8.9 - 10.3 mg/dL 9.5 9.6 9.1  Total Protein 6.5 - 8.1 g/dL 7.3 7.3 6.8  Total Bilirubin 0.3 - 1.2 mg/dL 0.3 0.3 0.3  Alkaline Phos 38 - 126 U/L 119 109 101  AST 15 - 41 U/L 14(L) 17 10(L)  ALT 0 - 44 U/L _2 Lab Results  Component Value Date   WBC 3.4 (L) 10/24/2018   HGB 11.7 (L) 10/24/2018   HCT 36.6 10/24/2018   MCV 82.6 10/24/2018   PLT 221 10/24/2018   NEUTROABS 1.8 10/24/2018    ASSESSMENT & PLAN:  Cancer of left breast [facility]) Prior left breast cancer 2012: Treated with neoadjuvant FEC-Taxotere status post lumpectomy radiation. Recurrence: Right axillary lymph node. Neoadjuvant chemo carboplatin and gemcitabine x6 cycles Right axillary lymph node dissection 1/11 lymph nodes positive Adjuvant radiation with Xelodacompleted 06/19/18  Current treatment:Cycle 6day1CMF adj chemo  Chemo toxicities: Fatigue Monitoring closely.  With this she will complete her chemotherapy. Return to clinic in 2 months for follow-up with labs. I recommended that we will keep the port intact for at least 6 months.  She will be set up for every 35monthport flushes.    No orders of the defined types were placed in this encounter.  The patient has a good understanding of the overall plan. she agrees with it. she will call with any problems that may develop before the next visit here.  GNicholas Lose MD 10/24/2018   I, MCloyde ReamsDorshimer, am acting as scribe for VNicholas Lose MD.  I have reviewed the above documentation for accuracy and completeness, and I agree with the above.

## 2018-10-24 ENCOUNTER — Inpatient Hospital Stay (HOSPITAL_BASED_OUTPATIENT_CLINIC_OR_DEPARTMENT_OTHER): Payer: BLUE CROSS/BLUE SHIELD | Admitting: Hematology and Oncology

## 2018-10-24 ENCOUNTER — Inpatient Hospital Stay: Payer: BLUE CROSS/BLUE SHIELD

## 2018-10-24 ENCOUNTER — Telehealth: Payer: Self-pay | Admitting: Hematology and Oncology

## 2018-10-24 VITALS — BP 160/83 | HR 75

## 2018-10-24 DIAGNOSIS — Z5111 Encounter for antineoplastic chemotherapy: Secondary | ICD-10-CM | POA: Diagnosis not present

## 2018-10-24 DIAGNOSIS — C50012 Malignant neoplasm of nipple and areola, left female breast: Secondary | ICD-10-CM

## 2018-10-24 DIAGNOSIS — M545 Low back pain: Secondary | ICD-10-CM

## 2018-10-24 DIAGNOSIS — R5383 Other fatigue: Secondary | ICD-10-CM

## 2018-10-24 DIAGNOSIS — Z79899 Other long term (current) drug therapy: Secondary | ICD-10-CM

## 2018-10-24 DIAGNOSIS — I10 Essential (primary) hypertension: Secondary | ICD-10-CM

## 2018-10-24 DIAGNOSIS — N6331 Unspecified lump in axillary tail of the right breast: Secondary | ICD-10-CM

## 2018-10-24 DIAGNOSIS — Z171 Estrogen receptor negative status [ER-]: Secondary | ICD-10-CM

## 2018-10-24 DIAGNOSIS — Z801 Family history of malignant neoplasm of trachea, bronchus and lung: Secondary | ICD-10-CM

## 2018-10-24 DIAGNOSIS — Z853 Personal history of malignant neoplasm of breast: Secondary | ICD-10-CM

## 2018-10-24 DIAGNOSIS — Z95828 Presence of other vascular implants and grafts: Secondary | ICD-10-CM

## 2018-10-24 DIAGNOSIS — C50611 Malignant neoplasm of axillary tail of right female breast: Secondary | ICD-10-CM | POA: Diagnosis not present

## 2018-10-24 DIAGNOSIS — Z923 Personal history of irradiation: Secondary | ICD-10-CM

## 2018-10-24 DIAGNOSIS — Z9221 Personal history of antineoplastic chemotherapy: Secondary | ICD-10-CM

## 2018-10-24 DIAGNOSIS — R110 Nausea: Secondary | ICD-10-CM

## 2018-10-24 DIAGNOSIS — Z803 Family history of malignant neoplasm of breast: Secondary | ICD-10-CM

## 2018-10-24 LAB — CBC WITH DIFFERENTIAL (CANCER CENTER ONLY)
Abs Immature Granulocytes: 0.05 10*3/uL (ref 0.00–0.07)
BASOS ABS: 0 10*3/uL (ref 0.0–0.1)
Basophils Relative: 1 %
EOS ABS: 0.1 10*3/uL (ref 0.0–0.5)
EOS PCT: 3 %
HEMATOCRIT: 36.6 % (ref 36.0–46.0)
Hemoglobin: 11.7 g/dL — ABNORMAL LOW (ref 12.0–15.0)
Immature Granulocytes: 2 %
Lymphocytes Relative: 22 %
Lymphs Abs: 0.7 10*3/uL (ref 0.7–4.0)
MCH: 26.4 pg (ref 26.0–34.0)
MCHC: 32 g/dL (ref 30.0–36.0)
MCV: 82.6 fL (ref 80.0–100.0)
Monocytes Absolute: 0.8 10*3/uL (ref 0.1–1.0)
Monocytes Relative: 22 %
Neutro Abs: 1.8 10*3/uL (ref 1.7–7.7)
Neutrophils Relative %: 50 %
Platelet Count: 221 10*3/uL (ref 150–400)
RBC: 4.43 MIL/uL (ref 3.87–5.11)
RDW: 13.9 % (ref 11.5–15.5)
WBC Count: 3.4 10*3/uL — ABNORMAL LOW (ref 4.0–10.5)
nRBC: 0 % (ref 0.0–0.2)

## 2018-10-24 LAB — CMP (CANCER CENTER ONLY)
ALT: 14 U/L (ref 0–44)
AST: 14 U/L — ABNORMAL LOW (ref 15–41)
Albumin: 3.8 g/dL (ref 3.5–5.0)
Alkaline Phosphatase: 119 U/L (ref 38–126)
Anion gap: 10 (ref 5–15)
BUN: 10 mg/dL (ref 6–20)
CO2: 26 mmol/L (ref 22–32)
CREATININE: 0.82 mg/dL (ref 0.44–1.00)
Calcium: 9.5 mg/dL (ref 8.9–10.3)
Chloride: 104 mmol/L (ref 98–111)
GFR, Estimated: >60 mL/min (ref >60)
Glucose, Bld: 92 mg/dL (ref 70–99)
Potassium: 4 mmol/L (ref 3.5–5.1)
SODIUM: 140 mmol/L (ref 135–145)
Total Bilirubin: 0.3 mg/dL (ref 0.3–1.2)
Total Protein: 7.3 g/dL (ref 6.5–8.1)

## 2018-10-24 MED ORDER — SODIUM CHLORIDE 0.9 % IV SOLN
Freq: Once | INTRAVENOUS | Status: AC
  Administered 2018-10-24: 13:00:00 via INTRAVENOUS
  Filled 2018-10-24: qty 250

## 2018-10-24 MED ORDER — HEPARIN SOD (PORK) LOCK FLUSH 100 UNIT/ML IV SOLN
500.0000 [IU] | Freq: Once | INTRAVENOUS | Status: AC | PRN
  Administered 2018-10-24: 500 [IU]
  Filled 2018-10-24: qty 5

## 2018-10-24 MED ORDER — SODIUM CHLORIDE 0.9% FLUSH
10.0000 mL | Freq: Once | INTRAVENOUS | Status: AC
  Administered 2018-10-24: 10 mL
  Filled 2018-10-24: qty 10

## 2018-10-24 MED ORDER — PALONOSETRON HCL INJECTION 0.25 MG/5ML
INTRAVENOUS | Status: AC
  Filled 2018-10-24: qty 5

## 2018-10-24 MED ORDER — METHOTREXATE SODIUM (PF) CHEMO INJECTION 250 MG/10ML
39.9000 mg/m2 | Freq: Once | INTRAMUSCULAR | Status: AC
  Administered 2018-10-24: 87 mg via INTRAVENOUS
  Filled 2018-10-24: qty 3.48

## 2018-10-24 MED ORDER — SODIUM CHLORIDE 0.9 % IV SOLN
600.0000 mg/m2 | Freq: Once | INTRAVENOUS | Status: AC
  Administered 2018-10-24: 1300 mg via INTRAVENOUS
  Filled 2018-10-24: qty 65

## 2018-10-24 MED ORDER — FLUOROURACIL CHEMO INJECTION 2.5 GM/50ML
600.0000 mg/m2 | Freq: Once | INTRAVENOUS | Status: AC
  Administered 2018-10-24: 1300 mg via INTRAVENOUS
  Filled 2018-10-24: qty 26

## 2018-10-24 MED ORDER — PALONOSETRON HCL INJECTION 0.25 MG/5ML
0.2500 mg | Freq: Once | INTRAVENOUS | Status: AC
  Administered 2018-10-24: 0.25 mg via INTRAVENOUS

## 2018-10-24 MED ORDER — SODIUM CHLORIDE 0.9% FLUSH
10.0000 mL | INTRAVENOUS | Status: DC | PRN
  Administered 2018-10-24: 10 mL
  Filled 2018-10-24: qty 10

## 2018-10-24 MED ORDER — DEXAMETHASONE SODIUM PHOSPHATE 10 MG/ML IJ SOLN
INTRAMUSCULAR | Status: AC
  Filled 2018-10-24: qty 1

## 2018-10-24 MED ORDER — DEXAMETHASONE SODIUM PHOSPHATE 10 MG/ML IJ SOLN
10.0000 mg | Freq: Once | INTRAMUSCULAR | Status: AC
  Administered 2018-10-24: 10 mg via INTRAVENOUS

## 2018-10-24 NOTE — Assessment & Plan Note (Signed)
Prior left breast cancer 2012: Treated with neoadjuvant FEC-Taxotere status post lumpectomy radiation. Recurrence: Right axillary lymph node. Neoadjuvant chemo carboplatin and gemcitabine x6 cycles Right axillary lymph node dissection 1/11 lymph nodes positive Adjuvant radiation with Xelodacompleted 06/19/18  Current treatment:Cycle 6day1CMF adj chemo  Chemo toxicities: None Monitoring closely.  With this she will complete her chemotherapy. Return to clinic in 3 months for follow-up with labs.

## 2018-10-24 NOTE — Telephone Encounter (Signed)
Gave avs and calendar ° °

## 2018-10-24 NOTE — Patient Instructions (Signed)
[facility] Discharge Instructions for Patients Receiving Chemotherapy  Today you received the following chemotherapy agents Cyclophosphamide (CYTOXAN), Methotrexate (PF) & Flourouracil (ADRUCIL).  To help prevent nausea and vomiting after your treatment, we encourage you to take your nausea medication as prescribed.   If you develop nausea and vomiting that is not controlled by your nausea medication, call the clinic.   BELOW ARE SYMPTOMS THAT SHOULD BE REPORTED IMMEDIATELY:  *FEVER GREATER THAN 100.5 F  *CHILLS WITH OR WITHOUT FEVER  NAUSEA AND VOMITING THAT IS NOT CONTROLLED WITH YOUR NAUSEA MEDICATION  *UNUSUAL SHORTNESS OF BREATH  *UNUSUAL BRUISING OR BLEEDING  TENDERNESS IN MOUTH AND THROAT WITH OR WITHOUT PRESENCE OF ULCERS  *URINARY PROBLEMS  *BOWEL PROBLEMS  UNUSUAL RASH Items with * indicate a potential emergency and should be followed up as soon as possible.  Feel free to call the clinic should you have any questions or concerns. The clinic phone number is [phone].  Please show the CHEMO ALERT CARD at check-in to the Emergency Department and triage nurse.   

## 2018-11-10 ENCOUNTER — Telehealth: Payer: Self-pay | Admitting: *Deleted

## 2018-11-10 NOTE — Telephone Encounter (Signed)
Medical records faxed to [facility]; [place] [phone]

## 2018-12-01 ENCOUNTER — Telehealth: Payer: Self-pay | Admitting: *Deleted

## 2018-12-01 NOTE — Telephone Encounter (Signed)
Medical records faxed to [facility]; RI# [phone]

## 2018-12-20 NOTE — Progress Notes (Signed)
Patient Care Team: Patient, [name] as PCP - General ([place]) Neldon Mc, MD as Surgeon (General Surgery) Everardo All, MD (Hematology and Oncology)  DIAGNOSIS:    ICD-10-CM   1. 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, estrogen receptor negative ([place]) C50.611    Z17.1     SUMMARY OF ONCOLOGIC HISTORY:   Cancer of left breast ([place])   06/30/2011 Initial Diagnosis    Breast cancer, IDC, Left, Stage III, Triple negative, 7.6 cm breast mass and palpable axillary mass    09/06/2011 Miscellaneous    BRCA 1 and 2: Negative    09/14/2011 - 11/23/2011 Neo-Adjuvant Chemotherapy    Dose dense FEC followed by dose dense Taxotere    12/21/2011 Surgery    Left lumpectomy: High-grade poorly differentiated IDC 1.7 cm with high-grade DCIS, margins negative, 3/7 lymph nodes positive, ER 0%, PR 0%, HER-2 negative ratio 1.33 T1CN1 stage IIb    12/31/2011 - 02/15/2012 Radiation Therapy    Radiation at [facility]    09/26/2017 Relapse/Recurrence    Left breast upper outer quadrant within the lumpectomy bed: Fibrosis no malignancy, right axillary lymph node biopsy metastatic high-grade carcinoma ER 0%, PR 0%, Ki-67 90%, HER-2 negative ratio 1.11 (similar to previous ductal carcinoma)    11/14/2017 - 03/06/2018 Neo-Adjuvant Chemotherapy    Neo-Adjuvant chemotherapy with Gemzar and carboplatin days 1 and 8 every 3 weeks    01/10/2018 Genetic Testing    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  The Hereditary Gene Panel offered by [facility] includes sequencing and/or deletion duplication testing of the following 47 genes: APC, ATM, AXIN2, BARD1, BMPR1A, BRCA1, BRCA2, BRIP1, CDH1, CDK4, CDKN2A (p14ARF), CDKN2A (p16INK4a), CHEK2, CTNNA1, DICER1, EPCAM (Deletion/duplication testing only), GREM1 (promoter region deletion/duplication testing only), KIT, MEN1, MLH1, MSH2, MSH3, MSH6, MUTYH, NBN, NF1, NHTL1, PALB2, PDGFRA, PMS2, POLD1, POLE,  PTEN, RAD50, RAD51C, RAD51D, SDHB, SDHC, SDHD, SMAD4, SMARCA4. STK11, TP53, TSC1, TSC2, and VHL.  The following genes were evaluated for sequence changes only: SDHA and HOXB13 c.251G>A variant only. The report date is January 10, 2018.      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 ([place])   04/06/2018 Surgery    Right axillary lymph node dissection: 1/11 node positive for metastatic high-grade carcinoma    04/25/2018 Initial Diagnosis    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 ([place])    04/25/2018 Cancer Staging    Staging form: Breast, AJCC 8th Edition - Clinical: Stage IIB (cT0, cN1, cM0, G3, ER-, PR-, HER2-) - Signed by Eppie Gibson, MD on 04/25/2018    05/16/2018 - 06/22/2018 Radiation Therapy    Adjuvant radiation therapy with Xeloda    07/11/2018 - 10/24/2018 Chemotherapy    Adjuvant chemotherapy with CMF     CHIEF COMPLIANT: Follow-up of chemotherapy to review labs  INTERVAL HISTORY: Jessica Simpson is a 48 y.o. with above-mentioned history of triple negative left breast cancer in 2012, followed by a recurrence of the cancer in the axillary tail of the right breast for which she completed 6 cycles of adjuvant chemotherapy with CMF on 10/24/18. Her most recent mammogram on 09/27/18 showed no evidence of malignancy. She presents to the clinic alone today. She notes improvement in her energy and strength. She reports tingling in her feet that improves with walking and occasional pain along her scar in the right breast. Her labs from today show: WBC 5.6, Hg 12.0, platelets 266, ANC 3.7. She is  planning to get a tattoo with her daughter once her port is removed. She reviewed her medication list with me.   REVIEW OF SYSTEMS:   Constitutional: Denies fevers, chills or abnormal weight loss (+) mild fatigue Eyes: Denies blurriness of vision Ears, nose, mouth, throat, and face: Denies mucositis or sore throat Respiratory: Denies cough, dyspnea or wheezes Cardiovascular: Denies  palpitation, chest discomfort Gastrointestinal: Denies nausea, heartburn or change in bowel habits Skin: Denies abnormal skin rashes Lymphatics: Denies new lymphadenopathy or easy bruising Neurological: Denies numbness, tingling or new weaknesses Behavioral/Psych: Mood is stable, no new changes  Extremities: No lower extremity edema Breast: denies any lumps or nodules in either breasts (+) occasional right breast pain All other systems were reviewed with the patient and are negative.  I have reviewed the past medical history, past surgical history, social history and family history with the patient and they are unchanged from previous note.  ALLERGIES:  is allergic to codeine and lortab [hydrocodone-acetaminophen].  MEDICATIONS:  Current Outpatient Medications  Medication Sig Dispense Refill  . acetaminophen (TYLENOL) 500 MG tablet Take 500 mg by mouth every 6 (six) hours as needed. For pain     . amLODipine (NORVASC) 5 MG tablet Take 5 mg by mouth daily.  6  . Cholecalciferol 5000 units capsule Take 1 capsule (5,000 Units total) by mouth daily.    . hydrochlorothiazide (MICROZIDE) 12.5 MG capsule Take 1 capsule (12.5 mg total) by mouth daily. 30 capsule 6  . vitamin B-12 (CYANOCOBALAMIN) 500 MCG tablet Take 500 mcg by mouth daily.     No current facility-administered medications for this visit.    Facility-Administered Medications Ordered in Other Visits  Medication Dose Route Frequency Provider Last Rate Last Dose  . lactated ringers infusion    Continuous PRN Kerby Less, CRNA        PHYSICAL EXAMINATION: ECOG PERFORMANCE STATUS: 1 - Symptomatic but completely ambulatory  Vitals:   12/21/18 1445  BP: (!) 152/80  Pulse: 83  Resp: 18  Temp: 98.4 F (36.9 C)  SpO2: 97%   Filed Weights   12/21/18 1445  Weight: 233 lb 14.4 oz (106.1 kg)    GENERAL: alert, no distress and comfortable SKIN: skin color, texture, turgor are normal, no rashes or significant  lesions EYES: normal, Conjunctiva are pink and non-injected, sclera clear OROPHARYNX: no exudate, no erythema and lips, buccal mucosa, and tongue normal  NECK: supple, thyroid normal size, non-tender, without nodularity LYMPH: no palpable lymphadenopathy in the cervical, axillary or inguinal LUNGS: clear to auscultation and percussion with normal breathing effort HEART: regular rate & rhythm and no murmurs and no lower extremity edema ABDOMEN: abdomen soft, non-tender and normal bowel sounds MUSCULOSKELETAL: no cyanosis of digits and no clubbing  NEURO: alert & oriented x 3 with fluent speech, no focal motor/sensory deficits EXTREMITIES: No lower extremity edema  LABORATORY DATA:  I have reviewed the data as listed CMP Latest Ref Rng & Units 12/21/2018 10/24/2018 10/03/2018  Glucose 70 - 99 mg/dL 78 92 131(H)  BUN 6 - 20 mg/dL 10 10 12   Creatinine 0.44 - 1.00 mg/dL 0.89 0.82 0.98  Sodium 135 - 145 mmol/L 140 140 141  Potassium 3.5 - 5.1 mmol/L 3.6 4.0 3.5  Chloride 98 - 111 mmol/L 104 104 106  CO2 22 - 32 mmol/L 25 26 27   Calcium 8.9 - 10.3 mg/dL 9.5 9.5 9.6  Total Protein 6.5 - 8.1 g/dL 7.7 7.3 7.3  Total Bilirubin 0.3 - 1.2 mg/dL 0.4  0.3 0.3  Alkaline Phos 38 - 126 U/L 128(H) 119 109  AST 15 - 41 U/L 15 14(L) 17  ALT 0 - 44 U/L 14 14 20     Lab Results  Component Value Date   WBC 5.6 12/21/2018   HGB 12.0 12/21/2018   HCT 37.6 12/21/2018   MCV 81.0 12/21/2018   PLT 266 12/21/2018   NEUTROABS 3.7 12/21/2018    ASSESSMENT & PLAN:  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 [facility]) Prior left breast cancer 2012: Treated with neoadjuvant FEC-Taxotere status post lumpectomy radiation. Recurrence: Right axillary lymph node. Neoadjuvant chemo carboplatin and gemcitabine x6 cycles Right axillary lymph node dissection 1/11 lymph nodes positive Adjuvant radiation with Xelodacompleted 06/19/18 Completed 6 cycles of CMF adjuvant chemo 10/24/2018  Breast cancer surveillance: 1.   Mammogram: 09/27/2018: Benign breast density category C 2.  Breast exam 12/21/2018: Benign  We will request Dr. Donne Hazel to remove the port. Patient plans to get a tattoo at the site of port removal.  Return to clinic in July for follow-up and after that we can see her once a year    No orders of the defined types were placed in this encounter.  The patient has a good understanding of the overall plan. she agrees with it. she will call with any problems that may develop before the next visit here.  Nicholas Lose, MD 12/21/2018  Julious Oka Dorshimer am acting as scribe for Dr. Nicholas Lose.  I have reviewed the above documentation for accuracy and completeness, and I agree with the above.

## 2018-12-21 ENCOUNTER — Inpatient Hospital Stay (HOSPITAL_BASED_OUTPATIENT_CLINIC_OR_DEPARTMENT_OTHER): Payer: BLUE CROSS/BLUE SHIELD | Admitting: Hematology and Oncology

## 2018-12-21 ENCOUNTER — Inpatient Hospital Stay: Payer: BLUE CROSS/BLUE SHIELD

## 2018-12-21 ENCOUNTER — Inpatient Hospital Stay: Payer: BLUE CROSS/BLUE SHIELD | Attending: Hematology and Oncology

## 2018-12-21 DIAGNOSIS — Z171 Estrogen receptor negative status [ER-]: Secondary | ICD-10-CM | POA: Diagnosis not present

## 2018-12-21 DIAGNOSIS — C50611 Malignant neoplasm of axillary tail of right female breast: Secondary | ICD-10-CM | POA: Diagnosis present

## 2018-12-21 DIAGNOSIS — Z9221 Personal history of antineoplastic chemotherapy: Secondary | ICD-10-CM

## 2018-12-21 DIAGNOSIS — R202 Paresthesia of skin: Secondary | ICD-10-CM | POA: Diagnosis not present

## 2018-12-21 DIAGNOSIS — Z923 Personal history of irradiation: Secondary | ICD-10-CM | POA: Insufficient documentation

## 2018-12-21 DIAGNOSIS — Z95828 Presence of other vascular implants and grafts: Secondary | ICD-10-CM

## 2018-12-21 DIAGNOSIS — C50012 Malignant neoplasm of nipple and areola, left female breast: Secondary | ICD-10-CM

## 2018-12-21 DIAGNOSIS — Z853 Personal history of malignant neoplasm of breast: Secondary | ICD-10-CM | POA: Insufficient documentation

## 2018-12-21 DIAGNOSIS — Z79899 Other long term (current) drug therapy: Secondary | ICD-10-CM | POA: Diagnosis not present

## 2018-12-21 DIAGNOSIS — N6331 Unspecified lump in axillary tail of the right breast: Secondary | ICD-10-CM

## 2018-12-21 LAB — CBC WITH DIFFERENTIAL/PLATELET
Abs Immature Granulocytes: 0.03 10*3/uL (ref 0.00–0.07)
Basophils Absolute: 0 10*3/uL (ref 0.0–0.1)
Basophils Relative: 0 %
Eosinophils Absolute: 0.1 10*3/uL (ref 0.0–0.5)
Eosinophils Relative: 3 %
HCT: 37.6 % (ref 36.0–46.0)
Hemoglobin: 12 g/dL (ref 12.0–15.0)
IMMATURE GRANULOCYTES: 1 %
LYMPHS PCT: 18 %
Lymphs Abs: 1 10*3/uL (ref 0.7–4.0)
MCH: 25.9 pg — ABNORMAL LOW (ref 26.0–34.0)
MCHC: 31.9 g/dL (ref 30.0–36.0)
MCV: 81 fL (ref 80.0–100.0)
Monocytes Absolute: 0.8 10*3/uL (ref 0.1–1.0)
Monocytes Relative: 13 %
Neutro Abs: 3.7 10*3/uL (ref 1.7–7.7)
Neutrophils Relative %: 65 %
Platelets: 266 10*3/uL (ref 150–400)
RBC: 4.64 MIL/uL (ref 3.87–5.11)
RDW: 14.2 % (ref 11.5–15.5)
WBC: 5.6 10*3/uL (ref 4.0–10.5)
nRBC: 0 % (ref 0.0–0.2)

## 2018-12-21 LAB — COMPREHENSIVE METABOLIC PANEL
ALT: 14 U/L (ref 0–44)
ANION GAP: 11 (ref 5–15)
AST: 15 U/L (ref 15–41)
Albumin: 4 g/dL (ref 3.5–5.0)
Alkaline Phosphatase: 128 U/L — ABNORMAL HIGH (ref 38–126)
BUN: 10 mg/dL (ref 6–20)
CO2: 25 mmol/L (ref 22–32)
Calcium: 9.5 mg/dL (ref 8.9–10.3)
Chloride: 104 mmol/L (ref 98–111)
Creatinine, Ser: 0.89 mg/dL (ref 0.44–1.00)
GFR calc Af Amer: >60 mL/min (ref >60)
GFR calc non Af Amer: >60 mL/min (ref >60)
Glucose, Bld: 78 mg/dL (ref 70–99)
Potassium: 3.6 mmol/L (ref 3.5–5.1)
SODIUM: 140 mmol/L (ref 135–145)
Total Bilirubin: 0.4 mg/dL (ref 0.3–1.2)
Total Protein: 7.7 g/dL (ref 6.5–8.1)

## 2018-12-21 MED ORDER — HEPARIN SOD (PORK) LOCK FLUSH 100 UNIT/ML IV SOLN
500.0000 [IU] | Freq: Once | INTRAVENOUS | Status: AC
  Administered 2018-12-21: 500 [IU]
  Filled 2018-12-21: qty 5

## 2018-12-21 MED ORDER — SODIUM CHLORIDE 0.9% FLUSH
10.0000 mL | Freq: Once | INTRAVENOUS | Status: AC
  Administered 2018-12-21: 10 mL
  Filled 2018-12-21: qty 10

## 2018-12-21 NOTE — Assessment & Plan Note (Signed)
Prior left breast cancer 2012: Treated with neoadjuvant FEC-Taxotere status post lumpectomy radiation. Recurrence: Right axillary lymph node. Neoadjuvant chemo carboplatin and gemcitabine x6 cycles Right axillary lymph node dissection 1/11 lymph nodes positive Adjuvant radiation with Xelodacompleted 06/19/18 Completed 6 cycles of CMF adjuvant chemo  Breast cancer surveillance: 1.  Mammogram: 09/27/2018: Benign breast density category C 2.  Breast exam 12/21/2018: Benign  We will request Dr. Donne Hazel to remove the port. Patient plans to get a tattoo at the site of port removal.  Return to clinic in July for follow-up and after that we can see her once a year

## 2019-01-02 ENCOUNTER — Ambulatory Visit (HOSPITAL_BASED_OUTPATIENT_CLINIC_OR_DEPARTMENT_OTHER): Admit: 2019-01-02 | Payer: BLUE CROSS/BLUE SHIELD | Admitting: General Surgery

## 2019-01-02 ENCOUNTER — Encounter (HOSPITAL_BASED_OUTPATIENT_CLINIC_OR_DEPARTMENT_OTHER): Payer: Self-pay

## 2019-01-02 SURGERY — REMOVAL PORT-A-CATH
Anesthesia: Monitor Anesthesia Care

## 2019-01-15 ENCOUNTER — Telehealth: Payer: Self-pay | Admitting: *Deleted

## 2019-01-15 NOTE — Telephone Encounter (Signed)
Received a call from pt stating that on Saturday she was outside walking and looked down and noticed her right index finger was swollen.  Pt states she has not been bitten by any insects that she knows of, pt denise any redness or cuts on the finger, pt also denise having a fever.  Pt states she is unsure if she hit her finger on something that would cause it to swell.  Pt states she still has feeling in that finger.  Instructed the pt to keep her extremity elevated and to ice the finger and try to take ibuprofen to help with inflammation if the cause is from her hitting it on something.  Also instructed the pt to call us or her PCP if she notices that she is no longer having any feelings in that finger or if it starts to turn blue. Pt verbalized understanding.

## 2019-01-23 ENCOUNTER — Telehealth: Payer: Self-pay | Admitting: Rehabilitation

## 2019-01-23 ENCOUNTER — Encounter: Payer: Self-pay | Admitting: Rehabilitation

## 2019-01-23 ENCOUNTER — Telehealth: Payer: Self-pay

## 2019-01-23 DIAGNOSIS — Z171 Estrogen receptor negative status [ER-]: Principal | ICD-10-CM

## 2019-01-23 DIAGNOSIS — C50611 Malignant neoplasm of axillary tail of right female breast: Secondary | ICD-10-CM

## 2019-01-23 NOTE — Telephone Encounter (Signed)
Returned call to patient regarding her new onset Rt finger swelling.  She reports that she had onset in the 2nd finger after a walk about 2 weeks after port removal and it is now into a few other fingers.  As pt's lymphedema started in her had on the Lt side she is agreeable to coming in to the clinic for assessment.  Rose will schedule this F/U appointment.

## 2019-01-23 NOTE — Telephone Encounter (Signed)
Patient called requesting PT referral.  Pt is having decreased motion in right index finger.  Reviewed with MD, okay for referral.  Referral placed.  Patient aware.

## 2019-01-24 ENCOUNTER — Other Ambulatory Visit: Payer: Self-pay | Admitting: Adult Health

## 2019-01-24 DIAGNOSIS — I890 Lymphedema, not elsewhere classified: Secondary | ICD-10-CM

## 2019-01-25 ENCOUNTER — Ambulatory Visit: Payer: BLUE CROSS/BLUE SHIELD | Attending: Hematology and Oncology | Admitting: Physical Therapy

## 2019-01-25 ENCOUNTER — Encounter: Payer: Self-pay | Admitting: Physical Therapy

## 2019-01-25 ENCOUNTER — Other Ambulatory Visit: Payer: Self-pay

## 2019-01-25 DIAGNOSIS — I890 Lymphedema, not elsewhere classified: Secondary | ICD-10-CM | POA: Diagnosis present

## 2019-01-25 NOTE — Therapy (Signed)
[facility] [place], [place], [address] Phone: [phone]   Fax:  [phone]  Physical Therapy Evaluation  Patient Details  Name: Jessica Simpson MRN: [id number] Date of Birth: 1971-09-17 Referring Provider (PT): Thedore Mins   Encounter Date: 01/25/2019  PT End of Session - 01/25/19 1221    Visit Number  1    Number of Visits  5    Date for PT Re-Evaluation  02/22/19    PT Start Time  1136    PT Stop Time  1216    PT Time Calculation (min)  40 min    Activity Tolerance  Patient tolerated treatment well    Behavior During Therapy  [facility] for tasks assessed/performed       Past Medical History:  Diagnosis Date  . BRCA1 negative 10/22/2011  . BRCA2 negative 10/22/2011  . Breast cancer, IDC, Left, Stage III, Triple negative 06/30/2011  . Contraceptive education 01/15/2016  . Family history of breast cancer   . Fracture    right 4th toe  . History of breast cancer 07/22/2014  . History of radiation therapy 05/16/18- 06/22/18   Right Breast/ 50.4 Gy in 28 fractions. Right posterior axilla and SCV nodes/ 50.4 Gy in 28 fractions.   . Hypertension   . Lymphedema 06/15/2012  . Lymphedema of arm   . Personal history of chemotherapy   . Personal history of radiation therapy   . Positive fecal occult blood test 07/22/2014  . S/P radiation therapy 2013   50 gray in 25 fractions to the left breast, supraclavicular, and axillary regions. She then received a boost to the left lumpectomy of 10 gray in 5 fractions. This was given at [place].  . Status post chemotherapy Comp. 11/23/11   FEC and Taxotere    Past Surgical History:  Procedure Laterality Date  . 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 AXILLARY LYMPH NODE BIOPSY Right 04/06/2018  . AXILLARY LYMPH NODE DISSECTION Right 04/06/2018   Procedure: RIGHT AXILLARY LYMPH NODE DISSECTION;  Surgeon:  Rolm Bookbinder, MD;  Location: [facility];  Service: General;  Laterality: Right;  . BREAST LUMPECTOMY    . BREAST SURGERY    . CESAREAN SECTION    . COLONOSCOPY N/A 08/14/2014   Procedure: COLONOSCOPY;  Surgeon: Rogene Houston, MD;  [facility];  Service: Endoscopy;  Laterality: N/A;  930  . EVACUATION BREAST HEMATOMA  12/21/2011   Procedure: EVACUATION HEMATOMA BREAST;  Surgeon: Stark Klein, MD;  Location: [facility];  Service: General;  Laterality: Left;  . HAND SURGERY     tumor on finger, left hand  . INCISION AND DRAINAGE ABSCESS ANAL     x3  . left breast biopsy with axillary biopsy     core bx  . PORT-A-CATH REMOVAL  12/21/2011   Procedure: REMOVAL PORT-A-CATH;  Surgeon: Haywood Lasso, MD;  Location: [facility];  Service: General;  Laterality: N/A;  . PORTACATH PLACEMENT  08/02/2011   dr Margot Chimes  . PORTACATH PLACEMENT Right 11/14/2017   Procedure: INSERTION PORT-A-CATH WITH [place];  Surgeon: Rolm Bookbinder, MD;  Location: [facility];  Service: General;  Laterality: Right;  . removal breast mass  2004   benign  . TREATMENT FISTULA ANAL    . TUMOR REMOVAL     on left hand  . TUMOR REMOVAL     rt. eye    There were  no vitals filed for this visit.   Subjective Assessment - 01/25/19 1145    Subjective  My right hand started swelling on March 21st when I was walking. The arm has started tingling a little bit but I switched the sleeve over and that helped it. The new glove I got is not tight enough so I use my old glove and I switch it back and forth.     Pertinent History  Breast cancer Lt treated in 2012-2013 with chemotherapy/taxotere post lumpectomy and SLNB 3/7 nodes and radiation with onset of lymphedema during radiation, treatment of lymphedema at [facility] in 2013.  Reoccurence in Rt axillary lymph node with chemotherapy ongoing carboplatin and gemcitabine x 6 cycles and ALND 1/11 positive, radiation completed 06/19/18 on the Rt.  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  No/denies    Pain Score  0-No pain         [facility] PT Assessment - 01/25/19 0001      Assessment   Medical Diagnosis  bilateral breast cancer/ bilateral UE lymphedema    Referring Provider (PT)  Thedore Mins    Onset Date/Surgical Date  03/25/18    Hand Dominance  Right    Prior Therapy  pt was seen last year at this clinic for LUE lymphedema      Precautions   Precautions  Other (comment)    Precaution Comments  lymphedema      Restrictions   Weight Bearing Restrictions  No      Balance Screen   Has the patient fallen in the past 6 months  No    Has the patient had a decrease in activity level because of a fear of falling?   No    Is the patient reluctant to leave their home because of a fear of falling?   No      Home Environment   Living Environment  Private residence    [place]   lives with 109 y.o. daughter   Available Help at Discharge  Family    Type of [place]      Prior Function   Level of [place]  On disability    Leisure  pt has been walking some depending on the weather      Cognition   Overall Cognitive Status  Within Functional Limits for tasks assessed      ROM / Strength   AROM / PROM / Strength  AROM      AROM   Overall AROM   Within functional limits for tasks performed    Overall AROM Comments  limited ROM of fingers when swollen        LYMPHEDEMA/ONCOLOGY QUESTIONNAIRE - 01/25/19 1154      Type   Cancer Type  bilateral breast      Surgeries   Lumpectomy Date  12/21/11   03/25/2018   Axillary Lymph Node Dissection Date  04/06/18    Number Lymph Nodes Removed  11   11 on R and 7 on L      Date Lymphedema/Swelling Started   Date  01/24/12   01/13/19 on th L     Treatment   Active Chemotherapy Treatment  No    Past Chemotherapy  Treatment  Yes  Active Radiation Treatment  No    Past Radiation Treatment  Yes    Current Hormone Treatment  No    Past Hormone Therapy  No      What other symptoms do you have   Are you Having Heaviness or Tightness  Yes    Are you having Pain  Yes    Are you having pitting edema  No    Is it Hard or Difficult finding clothes that fit  No    Do you have infections  No      Lymphedema Assessments   Lymphedema Assessments  Upper extremities      Right Upper Extremity Lymphedema   15 cm Proximal to Olecranon Process  38 cm    Olecranon Process  29.4 cm    15 cm Proximal to Ulnar Styloid Process  30.6 cm    Just Proximal to Ulnar Styloid Process  20 cm    Across Hand at [employer]  22.2 cm    At [place] of 2nd Digit  7.5 cm      Left Upper Extremity Lymphedema   15 cm Proximal to Olecranon Process  38.5 cm    Olecranon Process  29.2 cm    15 cm Proximal to Ulnar Styloid Process  29 cm    Just Proximal to Ulnar Styloid Process  19.7 cm    Across Hand at [employer]  22.3 cm    At [place] of 2nd Digit  7.1 cm             Outpatient Rehab from [facility]/25/2019 [facility]  Lymphedema Life Impact Scale Total Score  22.06 %      Objective measurements completed on examination: See above findings.      [place] Adult PT Treatment/Exercise - 01/25/19 0001      Manual Therapy   Manual Therapy  Edema management    Edema Management  measured pt for flat knit compression glove and issued information, educated pt to call DME supplier for sleeve to get a second one that she can wear on the right arm since the left sleeve fits that side well                  PT Long Term Goals - 01/25/19 1226      PT LONG TERM GOAL #1   Title  Pt will obtain sleeve for LUE for [employer] for long term management of lymphedema    Time  4    Period  Weeks    Status  New    [place] Date  02/22/19      PT LONG TERM GOAL #2   Title  Pt will receive  appropriate compression garments for management of LUE lymphedema.    Time  4    Period  Weeks    Status  New    Target Date  02/22/19      PT LONG TERM GOAL #3   Title  Pt will demonstrate no swelling in 2nd finger on left hand to allow improved ROM    Time  4    Period  Weeks    Status  New    Target Date  02/22/19             Plan - 01/25/19 1221    Clinical Impression Statement  Pt presents to PT today with R hand lymphedema especially in her 2nd finger that began about 2 weeks ago while she  was walking in the heat. Pt was seen at this clinic previously last November for treatment of L hand and UE lymphedema. She received a compression sleeve and glove at that time. THe glove did not fit well despite visiting DME a few times about the issue. She has been wearing her old glove which fits better. She has been using her pump on the right side and wears her sleeve and glove on the left as needed. Pt would benefit from skilled PT serivces to decrease R hand lymphedema and assist pt with obtaining appropriate compression garments. Will contact [employer] so pt can get a sleeve for the RUE.     Stability/Clinical Decision Making  Stable/Uncomplicated    Clinical Decision Making  Low    Rehab Potential  Good    PT Frequency  1x / week    PT Duration  4 weeks    PT Treatment/Interventions  ADLs/Self Care Home Management;Orthotic Fit/Training;Patient/family education;Manual lymph drainage;Compression bandaging;Manual techniques;Vasopneumatic Device    PT Next Visit Plan  see if pt got [employer] glove, see if she ordered a new sleeve, teach bandaging for fingers?, see if she heard from [place] about sleeve for RUE    Recommended Other Services  contacted Derick with [employer] about pt getting a sleeve for RUE, she already has pump and sleeve for LUE    Consulted and Agree with Plan of Care  Patient       Patient will benefit from skilled therapeutic intervention in order to improve the  following deficits and impairments:  Decreased knowledge of precautions, Increased edema  Visit Diagnosis: Lymphedema, not elsewhere classified     Problem List Patient Active Problem List   Diagnosis Date Noted  . Hypertension 09/01/2018  . Screening for colorectal cancer 09/01/2018  . Encounter for gynecological examination with Papanicolaou smear of cervix 09/01/2018  . 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 ([place]) 04/25/2018  . Genetic testing 01/12/2018  . Family history of breast cancer   . Port-A-Cath in place 11/21/2017  . Mass of axillary tail of right breast 08/24/2017  . Pain with urination 08/24/2017  . Urinary frequency 08/24/2017  . Hematuria 08/24/2017  . Encounter for well woman exam with routine gynecological exam 01/15/2016  . Benign cyst of right breast 11/29/2015  . Positive fecal occult blood test 07/22/2014  . History of breast cancer 07/22/2014  . Rectal bleeding 07/10/2014  . Lymphedema 06/15/2012  . BRCA1 negative 10/22/2011  . BRCA2 negative 10/22/2011  . Cancer of left breast ([place]) 06/30/2011    Class: Diagnosis of    Allyson Sabal [facility] 01/25/2019, 12:30 PM  [place], [place], [address] Phone: [phone]   Fax:  [phone]  Name: Jessica Simpson MRN: [id number] Date of Birth: Mar 21, 1971  Jessica Simpson, PT 01/25/19 12:30 PM

## 2019-02-06 ENCOUNTER — Ambulatory Visit: Payer: BLUE CROSS/BLUE SHIELD | Admitting: Physical Therapy

## 2019-02-13 ENCOUNTER — Ambulatory Visit: Payer: BLUE CROSS/BLUE SHIELD | Admitting: Physical Therapy

## 2019-04-30 ENCOUNTER — Telehealth: Payer: Self-pay

## 2019-04-30 NOTE — Telephone Encounter (Signed)
RN returned call.  Voicemail left for return call.  Re: Insurance form/progress notes.

## 2019-05-17 NOTE — Assessment & Plan Note (Signed)
Prior left breast cancer 2012: Treated with neoadjuvant FEC-Taxotere status post lumpectomy radiation. Recurrence: Right axillary lymph node. Neoadjuvant chemo carboplatin and gemcitabine x6 cycles Right axillary lymph node dissection 1/11 lymph nodes positive Adjuvant radiation with Xelodacompleted 06/19/18 Completed 6 cycles of CMF adjuvant chemo 10/24/2018  Breast cancer surveillance: 1.  Mammogram: 09/27/2018: Benign breast density category C 2.  Breast exam 12/21/2018: Benign   Patient plans to get a tattoo at the site of port removal.  Return to clinic in 1 year for follow-up

## 2019-05-21 NOTE — Progress Notes (Signed)
Patient Care Team: Patient, [name] as PCP - General ([place]) Neldon Mc, MD as Surgeon (General Surgery) Everardo All, MD (Hematology and Oncology)  DIAGNOSIS:    ICD-10-CM   1. 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, unspecified estrogen receptor status ([place])  C50.012     SUMMARY OF ONCOLOGIC HISTORY: Oncology History  Cancer of left breast ([place])  06/30/2011 Initial Diagnosis   Breast cancer, IDC, Left, Stage III, Triple negative, 7.6 cm breast mass and palpable axillary mass   09/06/2011 Miscellaneous   BRCA 1 and 2: Negative   09/14/2011 - 11/23/2011 Neo-Adjuvant Chemotherapy   Dose dense FEC followed by dose dense Taxotere   12/21/2011 Surgery   Left lumpectomy: High-grade poorly differentiated IDC 1.7 cm with high-grade DCIS, margins negative, 3/7 lymph nodes positive, ER 0%, PR 0%, HER-2 negative ratio 1.33 T1CN1 stage IIb   12/31/2011 - 02/15/2012 Radiation Therapy   Radiation at [facility]   09/26/2017 Relapse/Recurrence   Left breast upper outer quadrant within the lumpectomy bed: Fibrosis no malignancy, right axillary lymph node biopsy metastatic high-grade carcinoma ER 0%, PR 0%, Ki-67 90%, HER-2 negative ratio 1.11 (similar to previous ductal carcinoma)   11/14/2017 - 03/06/2018 Neo-Adjuvant Chemotherapy   Neo-Adjuvant chemotherapy with Gemzar and carboplatin days 1 and 8 every 3 weeks   01/10/2018 Genetic Testing   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  The Hereditary Gene Panel offered by [facility] includes sequencing and/or deletion duplication testing of the following 47 genes: APC, ATM, AXIN2, BARD1, BMPR1A, BRCA1, BRCA2, BRIP1, CDH1, CDK4, CDKN2A (p14ARF), CDKN2A (p16INK4a), CHEK2, CTNNA1, DICER1, EPCAM (Deletion/duplication testing only), GREM1 (promoter region deletion/duplication testing only), KIT, MEN1, MLH1, MSH2, MSH3, MSH6, MUTYH, NBN, NF1, NHTL1, PALB2, PDGFRA, PMS2, POLD1, POLE, PTEN,  RAD50, RAD51C, RAD51D, SDHB, SDHC, SDHD, SMAD4, SMARCA4. STK11, TP53, TSC1, TSC2, and VHL.  The following genes were evaluated for sequence changes only: SDHA and HOXB13 c.251G>A variant only. The report date is January 10, 2018.    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 with above-mentioned history of triple negative left breast cancer in 2012, followed by a recurrence of the cancer in the axillary tail of the right breast for which she completed 6 cycles of adjuvant chemotherapy with CMF. She presents to the clinic alone today for follow-up.  Complaining of worsening lymphedema in bilateral arms  REVIEW OF SYSTEMS:   Constitutional: Denies fevers, chills or abnormal weight loss Eyes: Denies blurriness of vision Ears, nose, mouth, throat, and face: Denies mucositis or sore throat Respiratory: Denies cough, dyspnea or wheezes Cardiovascular: Denies palpitation, chest discomfort Gastrointestinal: Denies nausea, heartburn or change in bowel habits Skin: Denies abnormal skin rashes Lymphatics: Denies new lymphadenopathy or easy bruising Neurological: Denies numbness, tingling or new weaknesses Behavioral/Psych: Mood is stable, no  new changes  Extremities: No lower extremity edema Breast: denies any pain or lumps or nodules in either  breasts All other systems were reviewed with the patient and are negative.  I have reviewed the past medical history, past surgical history, social history and family history with the patient and they are unchanged from previous note.  ALLERGIES:  is allergic to codeine and lortab [hydrocodone-acetaminophen].  MEDICATIONS:  Current Outpatient Medications  Medication Sig Dispense Refill  . acetaminophen (TYLENOL) 500 MG tablet Take 500 mg by mouth every 6 (six) hours as needed. For pain     . amLODipine (NORVASC) 5 MG tablet Take 5 mg by mouth daily.  6  . Cholecalciferol 5000 units capsule Take 1 capsule (5,000 Units total) by mouth daily.    . hydrochlorothiazide (MICROZIDE) 12.5 MG capsule Take 1 capsule (12.5 mg total) by mouth daily. (Patient not taking: Reported on 01/25/2019) 30 capsule 6  . vitamin B-12 (CYANOCOBALAMIN) 500 MCG tablet Take 500 mcg by mouth daily.     No current facility-administered medications for this visit.    Facility-Administered Medications Ordered in Other Visits  Medication Dose Route Frequency Provider Last Rate Last Dose  . lactated ringers infusion    Continuous PRN Kerby Less, CRNA        PHYSICAL EXAMINATION: ECOG PERFORMANCE STATUS: 1 - Symptomatic but completely ambulatory  Vitals:   05/22/19 1410  BP: (!) 195/101  Pulse: 77  Resp: 18  Temp: 98.3 F (36.8 C)  SpO2: 100%   Filed Weights   05/22/19 1410  Weight: 237 lb 14.4 oz (107.9 kg)    GENERAL: alert, no distress and comfortable SKIN: skin color, texture, turgor are normal, no rashes or significant lesions EYES: normal, Conjunctiva are pink and non-injected, sclera clear OROPHARYNX: no exudate, no erythema and lips, buccal mucosa, and tongue normal  NECK: supple, thyroid normal size, non-tender, without nodularity LYMPH: no palpable lymphadenopathy in the cervical, axillary or inguinal LUNGS: clear to auscultation and percussion with normal breathing effort HEART: regular  rate & rhythm and no murmurs and no lower extremity edema ABDOMEN: abdomen soft, non-tender and normal bowel sounds MUSCULOSKELETAL: no cyanosis of digits and no clubbing  NEURO: alert & oriented x 3 with fluent speech, no focal motor/sensory deficits EXTREMITIES: Upper extremity lymphedema  LABORATORY DATA:  I have reviewed the data as listed CMP Latest Ref Rng & Units 12/21/2018 10/24/2018 10/03/2018  Glucose 70 - 99 mg/dL 78 92 131(H)  BUN 6 - 20 mg/dL _0 Creatinine 0.44 - 1.00 mg/dL 0.89 0.82 0.98  Sodium 135 - 145 mmol/L 140 140 141  Potassium 3.5 - 5.1 mmol/L 3.6 4.0 3.5  Chloride 98 - 111 mmol/L 104 104 106  CO2 22 - 32 mmol/L _1 Calcium 8.9 - 10.3 mg/dL 9.5 9.5 9.6  Total Protein 6.5 - 8.1 g/dL 7.7 7.3 7.3  Total Bilirubin 0.3 - 1.2 mg/dL 0.4 0.3 0.3  Alkaline Phos 38 - 126 U/L 128(H) 119 109  AST 15 - 41 U/L 15 14(L) 17  ALT 0 - 44 U/L _2 Lab Results  Component Value Date   WBC 5.6 12/21/2018   HGB 12.0 12/21/2018   HCT 37.6 12/21/2018   MCV 81.0 12/21/2018   PLT 266 12/21/2018   NEUTROABS 3.7 12/21/2018    ASSESSMENT & PLAN:  Cancer of left breast [facility] [place]) Prior left breast cancer 2012: Treated with neoadjuvant FEC-Taxotere status post lumpectomy radiation. Recurrence: Right  axillary lymph node. Neoadjuvant chemo carboplatin and gemcitabine x6 cycles Right axillary lymph node dissection 1/11 lymph nodes positive Adjuvant radiation with Xelodacompleted 06/19/18 Completed 6 cycles of CMF adjuvant chemo 10/24/2018  Breast cancer surveillance: 1.  Mammogram: 09/27/2018: Benign breast density category C 2.  Breast exam 12/21/2018: Benign   Lymphedema bilateral arms: We will send her to physical therapy again. Patient plans to get a tattoo at the site of port removal.  Severe uncontrolled hypertension: I discussed with her that she needs to immediately see her primary care physician.  She tells me that she stopped taking hydrochlorothiazide.   She also tells me that the blood pressures do not run this high in our offices. In spite of this I recommended that she immediately see her PCP.  Return to clinic in 1 year for follow-up    No orders of the defined types were placed in this encounter.  The patient has a good understanding of the overall plan. she agrees with it. she will call with any problems that may develop before the next visit here.  Nicholas Lose, MD 05/22/2019  Julious Oka Dorshimer am acting as scribe for Dr. Nicholas Lose.  I have reviewed the above documentation for accuracy and completeness, and I agree with the above.

## 2019-05-22 ENCOUNTER — Inpatient Hospital Stay: Payer: BC Managed Care – PPO | Attending: Hematology and Oncology | Admitting: Hematology and Oncology

## 2019-05-22 ENCOUNTER — Other Ambulatory Visit: Payer: Self-pay

## 2019-05-22 DIAGNOSIS — I10 Essential (primary) hypertension: Secondary | ICD-10-CM

## 2019-05-22 DIAGNOSIS — C773 Secondary and unspecified malignant neoplasm of axilla and upper limb lymph nodes: Secondary | ICD-10-CM | POA: Diagnosis not present

## 2019-05-22 DIAGNOSIS — Z79899 Other long term (current) drug therapy: Secondary | ICD-10-CM | POA: Insufficient documentation

## 2019-05-22 DIAGNOSIS — I890 Lymphedema, not elsewhere classified: Secondary | ICD-10-CM | POA: Insufficient documentation

## 2019-05-22 DIAGNOSIS — Z171 Estrogen receptor negative status [ER-]: Secondary | ICD-10-CM | POA: Diagnosis not present

## 2019-05-22 DIAGNOSIS — Z923 Personal history of irradiation: Secondary | ICD-10-CM | POA: Insufficient documentation

## 2019-05-22 DIAGNOSIS — Z9221 Personal history of antineoplastic chemotherapy: Secondary | ICD-10-CM | POA: Diagnosis not present

## 2019-05-22 DIAGNOSIS — C50012 Malignant neoplasm of nipple and areola, left female breast: Secondary | ICD-10-CM

## 2019-05-23 ENCOUNTER — Ambulatory Visit: Payer: BC Managed Care – PPO | Attending: Hematology and Oncology | Admitting: Physical Therapy

## 2019-05-23 ENCOUNTER — Encounter: Payer: Self-pay | Admitting: Physical Therapy

## 2019-05-23 VITALS — BP 190/95

## 2019-05-23 DIAGNOSIS — I890 Lymphedema, not elsewhere classified: Secondary | ICD-10-CM | POA: Insufficient documentation

## 2019-05-23 NOTE — Therapy (Signed)
[place], [place], [address] Phone: [phone]   Fax:  [phone]  Physical Therapy Re-Evaluation  Patient Details  Name: Jessica Simpson MRN: [id number] Date of Birth: April 11, 1971 Referring Provider (PT): Dr Lindi Adie    Encounter Date: 05/23/2019  PT End of Session - 05/23/19 1716    Visit Number  2    Number of Visits  9    Date for PT Re-Evaluation  06/22/19    PT Start Time  1430    PT Stop Time  1515    PT Time Calculation (min)  45 min    Activity Tolerance  Patient tolerated treatment well    Behavior During Therapy  [facility] for tasks assessed/performed       Past Medical History:  Diagnosis Date  . BRCA1 negative 10/22/2011  . BRCA2 negative 10/22/2011  . Breast cancer, IDC, Left, Stage III, Triple negative 06/30/2011  . Contraceptive education 01/15/2016  . Family history of breast cancer   . Fracture    right 4th toe  . History of breast cancer 07/22/2014  . History of radiation therapy 05/16/18- 06/22/18   Right Breast/ 50.4 Gy in 28 fractions. Right posterior axilla and SCV nodes/ 50.4 Gy in 28 fractions.   . Hypertension   . Lymphedema 06/15/2012  . Lymphedema of arm   . Personal history of chemotherapy   . Personal history of radiation therapy   . Positive fecal occult blood test 07/22/2014  . S/P radiation therapy 2013   50 gray in 25 fractions to the left breast, supraclavicular, and axillary regions. She then received a boost to the left lumpectomy of 10 gray in 5 fractions. This was given at [place].  . Status post chemotherapy Comp. 11/23/11   FEC and Taxotere    Past Surgical History:  Procedure Laterality Date  . 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 AXILLARY LYMPH NODE BIOPSY Right 04/06/2018  . AXILLARY LYMPH NODE DISSECTION Right 04/06/2018   Procedure: RIGHT AXILLARY LYMPH NODE DISSECTION;  Surgeon:  Rolm Bookbinder, MD;  Location: [place];  Service: General;  Laterality: Right;  . BREAST LUMPECTOMY    . BREAST SURGERY    . CESAREAN SECTION    . COLONOSCOPY N/A 08/14/2014   Procedure: COLONOSCOPY;  Surgeon: Rogene Houston, MD;  [facility];  Service: Endoscopy;  Laterality: N/A;  930  . EVACUATION BREAST HEMATOMA  12/21/2011   Procedure: EVACUATION HEMATOMA BREAST;  Surgeon: Stark Klein, MD;  Location: [facility];  Service: General;  Laterality: Left;  . HAND SURGERY     tumor on finger, left hand  . INCISION AND DRAINAGE ABSCESS ANAL     x3  . left breast biopsy with axillary biopsy     core bx  . PORT-A-CATH REMOVAL  12/21/2011   Procedure: REMOVAL PORT-A-CATH;  Surgeon: Haywood Lasso, MD;  Location: [facility];  Service: General;  Laterality: N/A;  . PORTACATH PLACEMENT  08/02/2011   dr Margot Chimes  . PORTACATH PLACEMENT Right 11/14/2017   Procedure: INSERTION PORT-A-CATH WITH [place];  Surgeon: Rolm Bookbinder, MD;  Location: [place];  Service: General;  Laterality: Right;  . removal breast mass  2004   benign  . TREATMENT FISTULA ANAL    . TUMOR REMOVAL     on left hand  . TUMOR REMOVAL     rt. eye    Vitals:  05/23/19 1651  BP: (!) 190/95     Subjective Assessment - 05/23/19 1439    Subjective  "I can't get the swelling to come down in my right hand"  She daytime sleeves for both arms, gloves for both hands and a nighttime garment for her left arm.  She has a felxitouch for both UE's that uses alternately.  she did not call the doctor about her blood pressure yet, but said she would after it was checked today in right lower leg and found to be 190/95    Pertinent History  Breast cancer Lt treated in 2012-2013 with chemotherapy/taxotere post lumpectomy and SLNB 3/7 nodes and radiation with onset of lymphedema during radiation, treatment of lymphedema [facility] in 2013.  Reoccurence in Rt axillary lymph node with chemotherapy ongoing carboplatin and  gemcitabine x 6 cycles and ALND 1/11 positive, radiation completed 06/19/18 on the Rt.  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  No/denies         [facility] PT Assessment - 05/23/19 0001      Assessment   Medical Diagnosis  bilateral breast cancer/ bilateral UE lymphedema    Referring Provider (PT)  Dr Lindi Adie     Onset Date/Surgical Date  03/25/18    Hand Dominance  Right    Prior Therapy  pt was seen earlier this year at this clinic for LUE lymphedema      Precautions   Precautions  Other (comment)    Precaution Comments  lymphedema      Restrictions   Weight Bearing Restrictions  No      Home Environment   Living Environment  Private residence    [place]   lives with 23 y.o. daughter   Available Help at Discharge  Family    Type of Mobile      Prior Function   Level of [place]  On disability    Leisure  pt has been walking some depending on the weather      Cognition   Overall Cognitive Status  Within Functional Limits for tasks assessed      Observation/Other Assessments   Other Surveys   --   lymphedema lift impact scale 20.59     Coordination   Gross Motor Movements are Fluid and Coordinated  Yes      AROM   Overall AROM   Within functional limits for tasks performed    Overall AROM Comments  limited ROM of fingers when swollen        LYMPHEDEMA/ONCOLOGY QUESTIONNAIRE - 05/23/19 1445      Right Upper Extremity Lymphedema   15 cm Proximal to Olecranon Process  38.5 cm    Olecranon Process  29 cm    15 cm Proximal to Ulnar Styloid Process  30.9 cm    Just Proximal to Ulnar Styloid Process  19.2 cm    Across Hand at [employer]  22.2 cm    At [place] of 2nd Digit  7.5 cm      Left Upper Extremity Lymphedema   15 cm Proximal to Olecranon Process  38.5 cm     Olecranon Process  28.5 cm    15 cm Proximal to Ulnar Styloid Process  28.5 cm    Just  Proximal to Ulnar Styloid Process  19.3 cm    Across Hand at [employer]  22.1 cm    At [place] of 2nd Digit  7 cm             Outpatient Rehab from 05/23/2019 [facility]  Lymphedema Life Impact Scale Total Score  20.59 %      Objective measurements completed on examination: See above findings.      [facility] Adult PT Treatment/Exercise - 05/23/19 0001      Manual Therapy   Manual Therapy  Edema management;Compression Bandaging;Taping    Manual therapy comments  remeasured arms    Edema Management  instructed in elevation of hands with exercise with hands above elbow, above shoulders     Compression Bandaging  instructed patient : tg soft to hand and wrist. 1 and 1/2 elastomull with extra layer around first finger , one 6 cm bandage around hand     Kinesiotex  Edema      Kinesiotix   Edema  skinkote then 4 pronged fan over first finger and webspace to above wrist                   PT Long Term Goals - 05/23/19 1717      PT LONG TERM GOAL #1   Title  Pt will obtain sleeve for LUE for FlexiTouch for long term management of lymphedema    Status  Achieved      PT LONG TERM GOAL #2   Title  Pt will receive appropriate compression garments for management of LUE lymphedema.    Status  Achieved      PT LONG TERM GOAL #3   Title  Pt will demonstrate no swelling in 2nd finger on left hand to allow improved ROM    Status  On-going      PT LONG TERM GOAL #4   Title  Pt will be independent in bandaging of right hand to control swelling in finger    Time  4    Period  Weeks    Status  New             Plan - 05/23/19 1720    Clinical Impression Statement  Pt comes back to PT wanting help with reducing the swelling in her right index finger.  Her measurements are not different from those on eval of 01/25/2019, but her current managment plan  at home is not effective.  Added kinesiotape and instruction in compression bandaging today.  She still has increased BP and said she will call her MD today    Stability/Clinical Decision Making  Stable/Uncomplicated    PT Frequency  1x / week    PT Duration  4 weeks    PT Treatment/Interventions  ADLs/Self Care Home Management;Orthotic Fit/Training;Patient/family education;Manual lymph drainage;Compression bandaging;Manual techniques;Vasopneumatic Device    PT Next Visit Plan  see if bandaging to fingers and kinesiotape was effective. continue instruction and see if she can do it indepenentley    Consulted and Agree with Plan of Care  Patient       Patient will benefit from skilled therapeutic intervention in order to improve the following deficits and impairments:     Visit Diagnosis: 1. Lymphedema, not elsewhere classified        Problem List Patient Active Problem List   Diagnosis Date Noted  . Hypertension 09/01/2018  . Screening for colorectal cancer 09/01/2018  . Encounter for gynecological examination with Papanicolaou  smear of cervix 09/01/2018  . 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 ([place]) 04/25/2018  . Genetic testing 01/12/2018  . Family history of breast cancer   . Port-A-Cath in place 11/21/2017  . Mass of axillary tail of right breast 08/24/2017  . Pain with urination 08/24/2017  . Urinary frequency 08/24/2017  . Hematuria 08/24/2017  . Encounter for well woman exam with routine gynecological exam 01/15/2016  . Benign cyst of right breast 11/29/2015  . Positive fecal occult blood test 07/22/2014  . History of breast cancer 07/22/2014  . Rectal bleeding 07/10/2014  . Lymphedema 06/15/2012  . BRCA1 negative 10/22/2011  . BRCA2 negative 10/22/2011  . Cancer of left breast ([place]) 06/30/2011    Class: Diagnosis of   Donato Heinz. Owens Shark PT  Norwood Levo 05/23/2019, 5:23 PM  [place], [place], [address] Phone: [phone]   Fax:  [phone]  Name: Jessica Simpson MRN: [id number] Date of Birth: 1971-02-07

## 2019-05-28 ENCOUNTER — Ambulatory Visit: Payer: BC Managed Care – PPO | Admitting: Rehabilitation

## 2019-05-30 ENCOUNTER — Encounter

## 2019-06-04 ENCOUNTER — Encounter: Payer: Self-pay | Admitting: Rehabilitation

## 2019-06-04 ENCOUNTER — Other Ambulatory Visit: Payer: Self-pay

## 2019-06-04 ENCOUNTER — Ambulatory Visit: Payer: BC Managed Care – PPO | Attending: Hematology and Oncology | Admitting: Rehabilitation

## 2019-06-04 DIAGNOSIS — I972 Postmastectomy lymphedema syndrome: Secondary | ICD-10-CM

## 2019-06-04 NOTE — Therapy (Addendum)
[place], [place], [address] Phone: [phone]   Fax:  [phone]  Physical Therapy Treatment  Patient Details  Name: Jessica Simpson MRN: [id number] Date of Birth: Jan 16, 1971 Referring Provider (PT): Dr Lindi Adie    Encounter Date: 06/04/2019  PT End of Session - 06/04/19 1341    Visit Number  3    Number of Visits  9    Date for PT Re-Evaluation  06/22/19    PT Start Time  1300    PT Stop Time  1330    PT Time Calculation (min)  30 min    Activity Tolerance  Patient tolerated treatment well    Behavior During Therapy  [facility] for tasks assessed/performed       Past Medical History:  Diagnosis Date  . BRCA1 negative 10/22/2011  . BRCA2 negative 10/22/2011  . Breast cancer, IDC, Left, Stage III, Triple negative 06/30/2011  . Contraceptive education 01/15/2016  . Family history of breast cancer   . Fracture    right 4th toe  . History of breast cancer 07/22/2014  . History of radiation therapy 05/16/18- 06/22/18   Right Breast/ 50.4 Gy in 28 fractions. Right posterior axilla and SCV nodes/ 50.4 Gy in 28 fractions.   . Hypertension   . Lymphedema 06/15/2012  . Lymphedema of arm   . Personal history of chemotherapy   . Personal history of radiation therapy   . Positive fecal occult blood test 07/22/2014  . S/P radiation therapy 2013   50 gray in 25 fractions to the left breast, supraclavicular, and axillary regions. She then received a boost to the left lumpectomy of 10 gray in 5 fractions. This was given at [facility].  . Status post chemotherapy Comp. 11/23/11   FEC and Taxotere    Past Surgical History:  Procedure Laterality Date  . 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 AXILLARY LYMPH NODE BIOPSY Right 04/06/2018  . AXILLARY LYMPH NODE DISSECTION Right 04/06/2018   Procedure: RIGHT AXILLARY LYMPH NODE DISSECTION;  Surgeon:  Rolm Bookbinder, MD;  Location: [facility];  Service: General;  Laterality: Right;  . BREAST LUMPECTOMY    . BREAST SURGERY    . CESAREAN SECTION    . COLONOSCOPY N/A 08/14/2014   Procedure: COLONOSCOPY;  Surgeon: Rogene Houston, MD;  [facility];  Service: Endoscopy;  Laterality: N/A;  930  . EVACUATION BREAST HEMATOMA  12/21/2011   Procedure: EVACUATION HEMATOMA BREAST;  Surgeon: Stark Klein, MD;  Location: [facility];  Service: General;  Laterality: Left;  . HAND SURGERY     tumor on finger, left hand  . INCISION AND DRAINAGE ABSCESS ANAL     x3  . left breast biopsy with axillary biopsy     core bx  . PORT-A-CATH REMOVAL  12/21/2011   Procedure: REMOVAL PORT-A-CATH;  Surgeon: Haywood Lasso, MD;  Location: [facility];  Service: General;  Laterality: N/A;  . PORTACATH PLACEMENT  08/02/2011   dr Margot Chimes  . PORTACATH PLACEMENT Right 11/14/2017   Procedure: INSERTION PORT-A-CATH WITH [place];  Surgeon: Rolm Bookbinder, MD;  Location: [facility];  Service: General;  Laterality: Right;  . removal breast mass  2004   benign  . TREATMENT FISTULA ANAL    . TUMOR REMOVAL     on left hand  . TUMOR REMOVAL     rt. eye    There  were no vitals filed for this visit.  Subjective Assessment - 06/04/19 1304    Subjective  I think wrapping my hand was helpful it was the first time it looked normal.    Pertinent History  Breast cancer Lt treated in 2012-2013 with chemotherapy/taxotere post lumpectomy and SLNB 3/7 nodes and radiation with onset of lymphedema during radiation, treatment of lymphedema at [facility] in 2013.  Reoccurence in Rt axillary lymph node with chemotherapy ongoing carboplatin and gemcitabine x 6 cycles and ALND 1/11 positive, radiation completed 06/19/18 on the Rt.  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  No/denies                   Outpatient Re[facility]23/[facility]  Lymphedema Life Impact Scale Total Score  20.59 %           [facility] Adult PT Treatment/Exercise - 06/04/19 0001      Manual Therapy   Edema Management  pt reports the self hand bandaging will be useful "but I just can't keep it on all the time" so we decided that she would use her glove during the day and then bandage at night or use mollelast over the glove as needed.  Also discussed return to work strategies to include elevated desk, wrist pad, and frequent breaks                  PT Long Term Goals - 06/04/19 1343      PT LONG TERM GOAL #1   Title  Pt will obtain sleeve for LUE for FlexiTouch for long term management of lymphedema    Status  Achieved      PT LONG TERM GOAL #2   Title  Pt will receive appropriate compression garments for management of LUE lymphedema.    Status  Achieved      PT LONG TERM GOAL #3   Title  Pt will demonstrate no swelling in 2nd finger on left hand to allow improved ROM    Status  On-going      PT LONG TERM GOAL #4   Title  Pt will be independent in bandaging of right hand to control swelling in finger    Status  Achieved            Plan - 06/04/19 1320    Clinical Impression Statement  Pt reports that she feels good with self bandaging the hand using her video.  The plan will be for her to bandage at night and use the glove during hte day to make hand washing easier. Given information on how to order all supplies as the run out.       Patient will benefit from skilled therapeutic intervention in order to improve the following deficits and impairments:     Visit Diagnosis: 1. Postmastectomy lymphedema        Problem List Patient Active Problem List   Diagnosis Date Noted  . Hypertension 09/01/2018  . Screening for colorectal cancer 09/01/2018  . Encounter for gynecological examination with Papanicolaou smear  of cervix 09/01/2018  . 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 ([place]) 04/25/2018  . Genetic testing 01/12/2018  . Family history of breast cancer   . Port-A-Cath in place 11/21/2017  . Mass of axillary tail  of right breast 08/24/2017  . Pain with urination 08/24/2017  . Urinary frequency 08/24/2017  . Hematuria 08/24/2017  . Encounter for well woman exam with routine gynecological exam 01/15/2016  . Benign cyst of right breast 11/29/2015  . Positive fecal occult blood test 07/22/2014  . History of breast cancer 07/22/2014  . Rectal bleeding 07/10/2014  . Lymphedema 06/15/2012  . BRCA1 negative 10/22/2011  . BRCA2 negative 10/22/2011  . Cancer of left breast ([place]) 06/30/2011    Class: Diagnosis of    Stark Bray 06/04/2019, 1:44 PM  [place], [place], [address] Phone: [phone]   Fax:  [phone]  Name: Jessica Simpson MRN: [id number] Date of Birth: 1971-05-17  PHYSICAL THERAPY DISCHARGE SUMMARY  Visits from Start of Care: 3  Current functional level related to goals / functional outcomes: Ready for Dc with hand wrapping and garment use   Remaining deficits: Chronic lymphedema   Education / Equipment: HEP Plan: Patient agrees to discharge.  Patient goals were partially met. Patient is being discharged due to meeting the stated rehab goals.  ?????     Shan Levans, PT

## 2019-06-04 NOTE — Patient Instructions (Signed)
How to order bandaging supplies online for use at home

## 2019-06-20 ENCOUNTER — Telehealth: Payer: Self-pay | Admitting: *Deleted

## 2019-06-20 NOTE — Telephone Encounter (Signed)
Received call from pt stating [employer] disability is contacting her stating they have not received any paperwork from our office.  Veriffied with Dory Larsen, FMLA coordinator, paperwork has been faxed twice.  RN faxed paperwork for the 3rd time today and verified with [employer] the correct fax number [phone].  Pt also mailed copy of paperwork to have on file.

## 2019-08-12 IMAGING — PT NM PET TUM IMG RESTAG (PS) SKULL BASE T - THIGH
8 series · 25 of 25 positions shown · non-contrast
Comparison: Prior PET-CT from 07/28/2011

CLINICAL DATA: Subsequent treatment strategy for left breast cancer
metastatic to axillary lymph node..

EXAM:
NUCLEAR MEDICINE PET SKULL BASE TO THIGH
TECHNIQUE: 10.2 mCi F-18 FDG was injected intravenously. Full-ring PET imaging
was performed from the skull base to thigh after the radiotracer. CT
data was obtained and used for attenuation correction and anatomic
localization.
FASTING BLOOD GLUCOSE:  Value: 96 mg/dl

[Series 3: pet sk_thigh ac · axial · 5.0mm · 4.07mm/px · z∈[-1494,-530]mm · 6 of 242 slices shown]
[im 1/242]
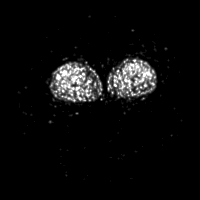
[im 49/242]
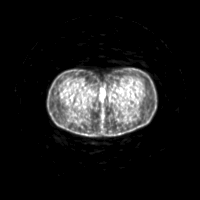
[im 97/242]
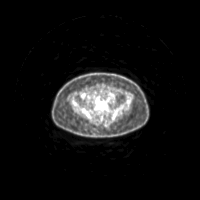
[im 145/242]
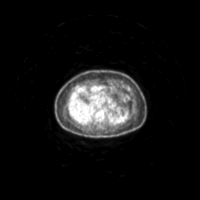
[im 193/242]
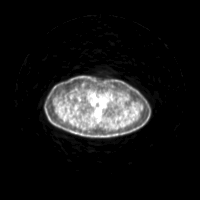
[im 242/242]
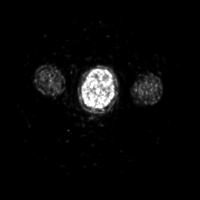

[Series 4: ct sk_thigh 5.0 b31f · axial · 5.0mm · 0.98mm/px · z∈[-1494,-530]mm · 5 of 242 slices shown]
[im 1/242]
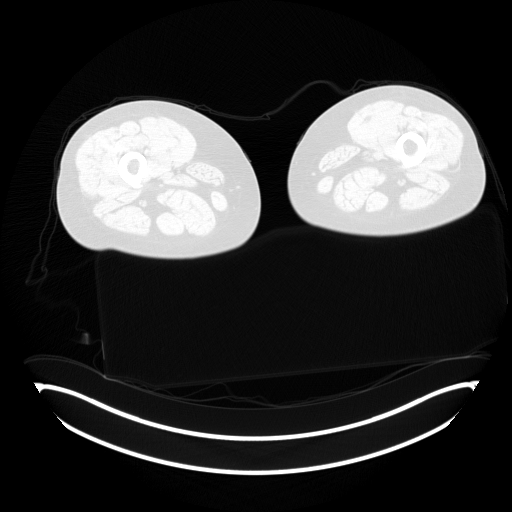
[im 61/242]
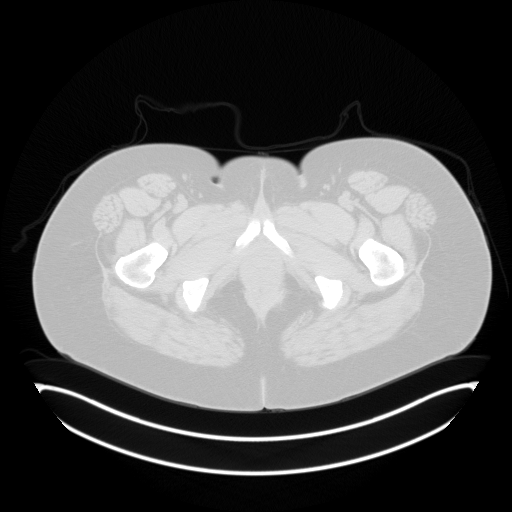
[im 121/242]
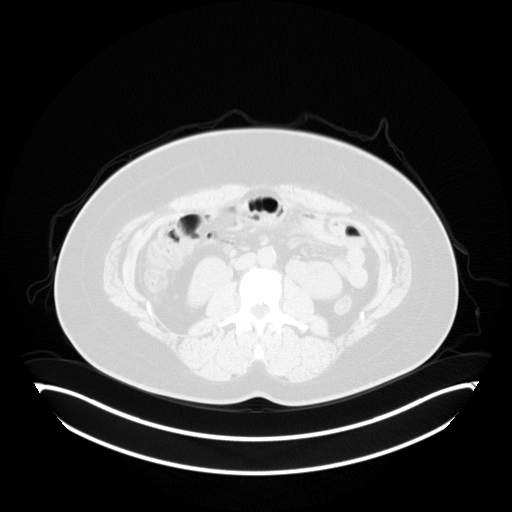
[im 181/242]
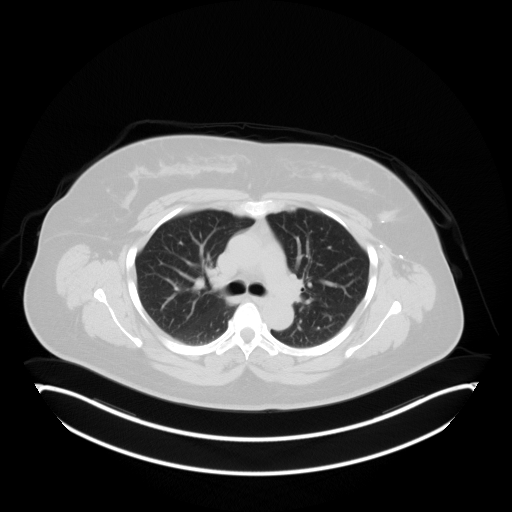
[im 242/242  brain]
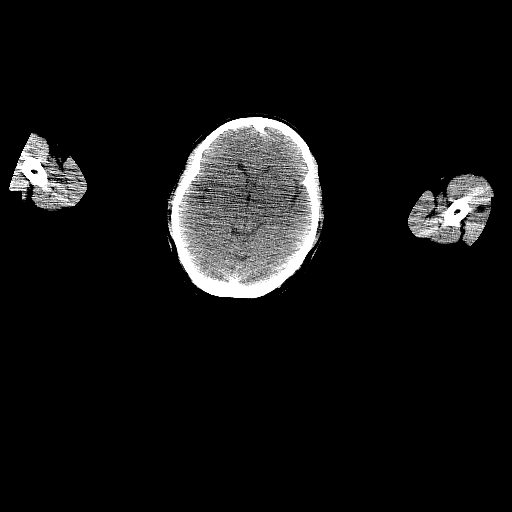

[Series 5: pet sk_thigh nac · axial · 5.0mm · 4.07mm/px · z∈[-1494,-530]mm · 5 of 242 slices shown]
[im 1/242]
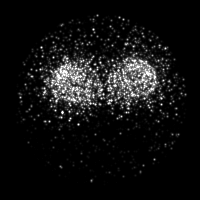
[im 61/242]
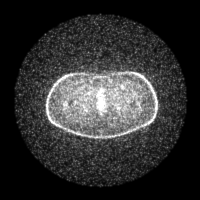
[im 121/242]
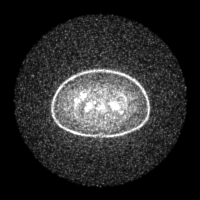
[im 181/242]
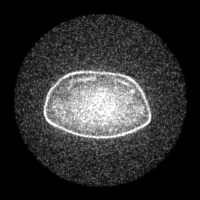
[im 242/242]
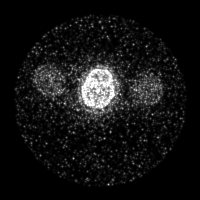

[Series 8: ct sk_thigh 5.0 (id) lung_bone · axial · 5.0mm · 0.76mm/px · 1 of 55 slices shown]
[im 1/55  bone]
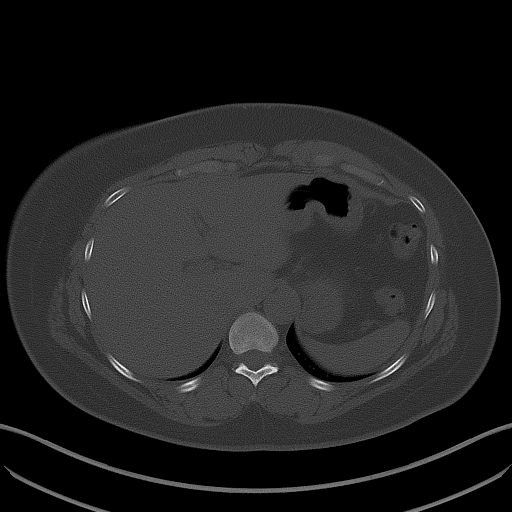

[Series 603: range-ct sk_thigh 5.0 (id)<alpha range> · 2 of 87 slices shown (1 of 2)]
[im 1/87]
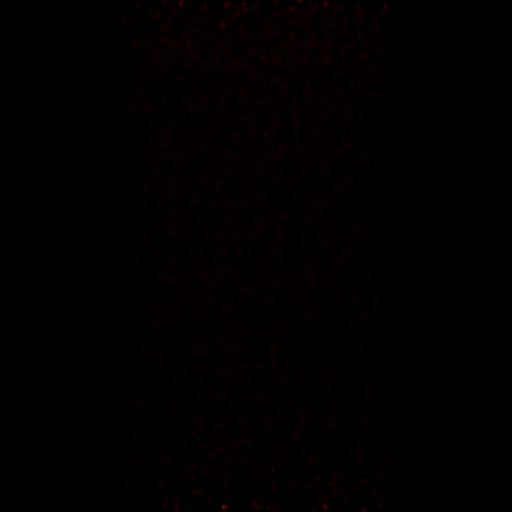
[im 87/87]
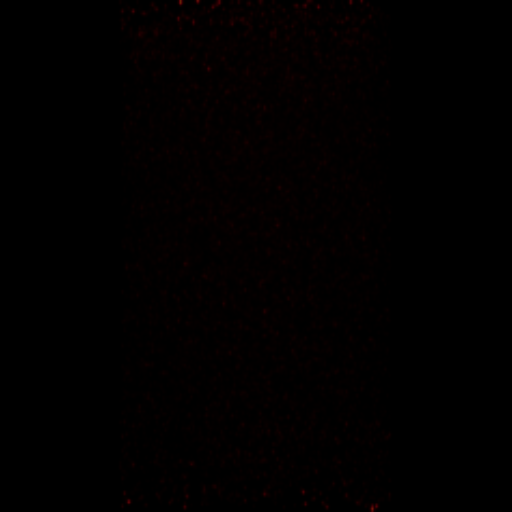

[Series 604: mip range · coronal · 2.00mm/px · 1 of 32 slices shown]
[im 1/32]
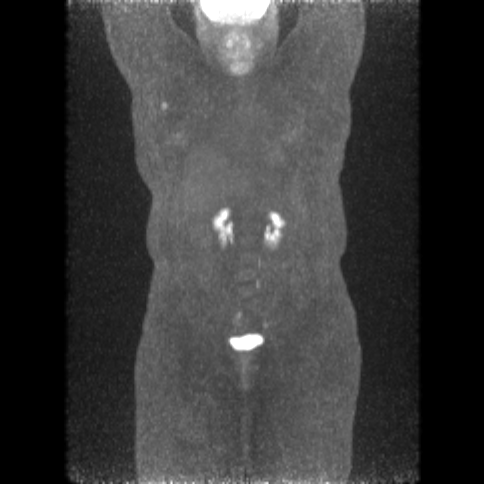

[Series 605: range-ct sk_thigh 5.0 (id)<alpha range> · 4 of 197 slices shown (2 of 2)]
[im 1/197]
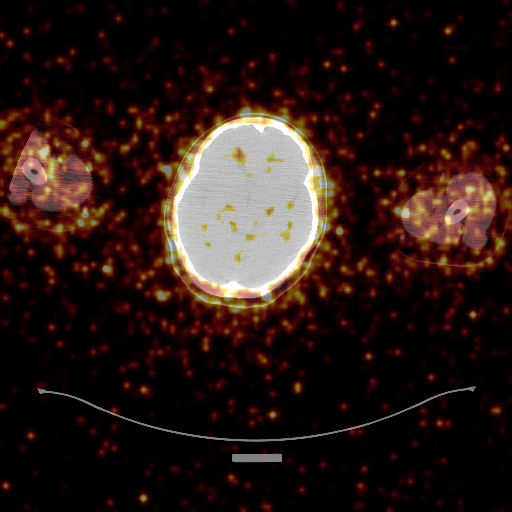
[im 66/197]
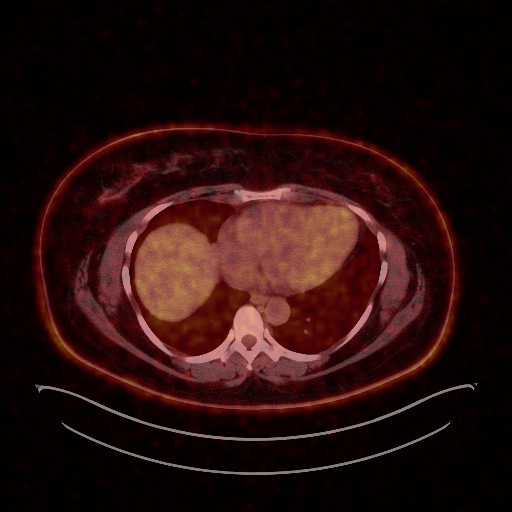
[im 131/197]
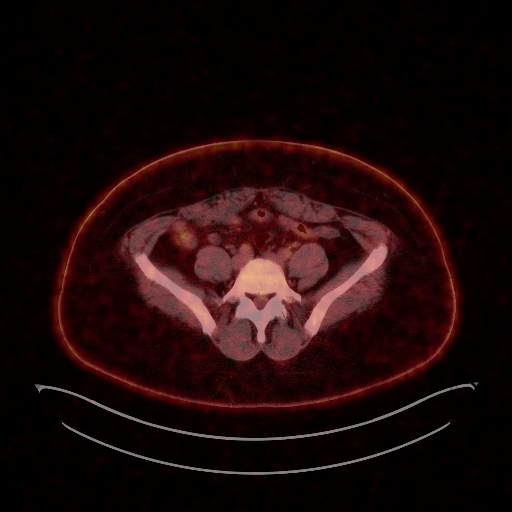
[im 197/197]
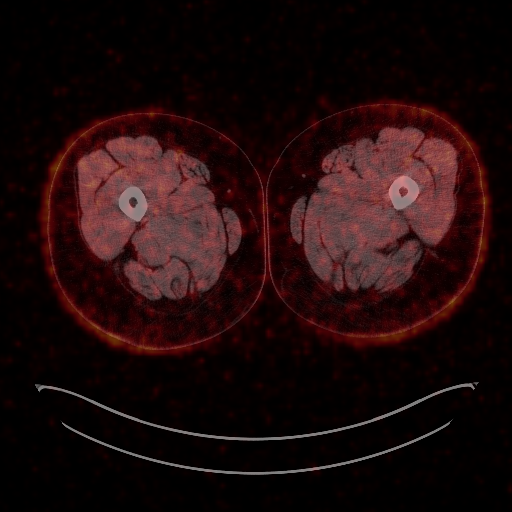

[Series 1072: results mm oncology reading · 5.0mm · 0.80mm/px · 1 of 4 slices shown]
[im 1/4]
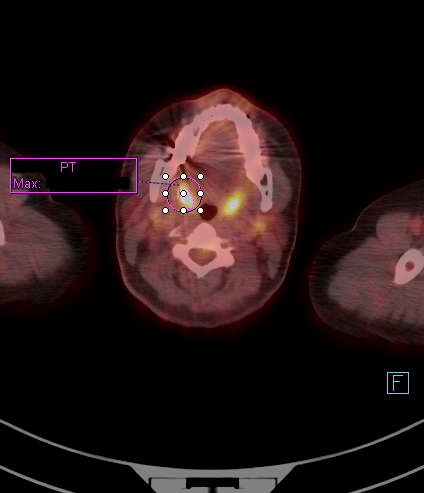

[25 of 25 positions shown; findings below may reference images not displayed]

FINDINGS: NECK

Activity in the bilateral palatine tonsils, maximum SUV 10.3 on the
left and 12.1 on the right. A left station 2 lymph node measuring
0.7 cm in short axis on image [DATE] has a maximum standard uptake
value of 4.3.

CHEST

A right axillary lymph node measuring 1.3 cm in short axis on image
54/2 has a maximum standard uptake value of 7.0. The previous bulky
and highly hypermetabolic left lateral breast and axillary lesions
are no longer present. Lumpectomy site laterally in the left breast
with faint and likely chronic postinflammatory activity, maximum SUV
3.4.

Mild cardiomegaly. Atherosclerotic calcification of the aortic arch
and descending thoracic aorta.

ABDOMEN/PELVIS

No abnormal hypermetabolic activity within the liver, pancreas,
adrenal glands, or spleen. No hypermetabolic lymph nodes in the
abdomen or pelvis.

Accentuated activity in the right hemipelvis may be within the right
ovary or within the immediately adjacent right ureter. Is projects
within the ovary but the immediate adjacent location of the ureter
could actually be causing this activity. Maximum SUV 10.3. No
ovarian enlargement is identified.

SKELETON

No focal hypermetabolic activity to suggest skeletal metastasis.
IMPRESSION: 1. Hypermetabolic 1.3 cm right axillary lymph node, maximum SUV 7.0,
concerning for recurrent malignancy.
2. Postoperative findings in the left lateral breast from prior
lumpectomy.
3. Bilateral accentuated tonsillar activity, relatively symmetric,
accordingly most likely to be physiologic rather than neoplastic.
Faintly accentuated metabolic activity in a normal-sized left
station 2 lymph node, maximum SUV 4.3.
4. Accentuated activity projecting over the right ovary, although
conceivably this might be activity in the immediately adjacent right
ureter. Pelvic sonography recommended for definitive
characterization of the right ovary. If there is a significantly
abnormal right ovarian lesion sonography, the possibility of a
hypermetabolic small ovarian mass must be considered.
5.  Aortic Atherosclerosis ([id number]).  Mild cardiomegaly.

## 2019-08-16 ENCOUNTER — Other Ambulatory Visit: Payer: Self-pay | Admitting: Hematology and Oncology

## 2019-08-16 DIAGNOSIS — Z853 Personal history of malignant neoplasm of breast: Secondary | ICD-10-CM

## 2019-10-01 ENCOUNTER — Other Ambulatory Visit: Payer: Self-pay

## 2019-10-01 ENCOUNTER — Ambulatory Visit
Admission: RE | Admit: 2019-10-01 | Discharge: 2019-10-01 | Disposition: A | Discharge status: Home / Self Care | Payer: BC Managed Care – PPO | Source: Ambulatory Visit | Attending: Hematology and Oncology | Admitting: Hematology and Oncology

## 2019-10-01 DIAGNOSIS — Z853 Personal history of malignant neoplasm of breast: Secondary | ICD-10-CM

## 2019-11-13 ENCOUNTER — Encounter: Payer: Self-pay | Admitting: Adult Health

## 2019-11-13 ENCOUNTER — Ambulatory Visit (INDEPENDENT_AMBULATORY_CARE_PROVIDER_SITE_OTHER): Payer: BC Managed Care – PPO | Admitting: Adult Health

## 2019-11-13 ENCOUNTER — Other Ambulatory Visit: Payer: Self-pay

## 2019-11-13 VITALS — BP 168/98 | HR 85 | Ht 66.0 in | Wt 237.0 lb

## 2019-11-13 DIAGNOSIS — Z1212 Encounter for screening for malignant neoplasm of rectum: Secondary | ICD-10-CM

## 2019-11-13 DIAGNOSIS — I10 Essential (primary) hypertension: Secondary | ICD-10-CM

## 2019-11-13 DIAGNOSIS — Z01419 Encounter for gynecological examination (general) (routine) without abnormal findings: Secondary | ICD-10-CM

## 2019-11-13 DIAGNOSIS — Z1211 Encounter for screening for malignant neoplasm of colon: Secondary | ICD-10-CM | POA: Diagnosis not present

## 2019-11-13 DIAGNOSIS — Z853 Personal history of malignant neoplasm of breast: Secondary | ICD-10-CM

## 2019-11-13 LAB — HEMOCCULT GUIAC POC 1CARD (OFFICE): Fecal Occult Blood, POC: NEGATIVE

## 2019-11-13 NOTE — Progress Notes (Signed)
Patient ID: Jessica Simpson, female   DOB: October 18, 1971, 49 y.o.   MRN: [id number] History of Present Illness: Jessica Simpson is a 49 year old black female, single, G1P1, in for a well woman gyn exam. She had a normal pap with negative HPV 09/01/18. She says BP was 138/82 before Christmas. She is getting married in May. Sees doctor in [place].     Current Medications, Allergies, Past Medical History, Past Surgical History, Family History and Social History were reviewed in [employer] record.     Review of Systems: Patient denies any hearing loss, fatigue, blurred vision, shortness of breath, chest pain, abdominal pain, problems with bowel movements, urination, or intercourse. No joint pain or mood swings. Not having periods since last chemo. Has had some headaches and teeth hurt at times.    Physical Exam:BP (!) 168/98 (BP Location: Left Leg)   Pulse 85   Ht 5\' 6"  (1.676 m)   Wt 237 lb (107.5 kg)   BMI 38.25 kg/m  General:  Well developed, well nourished, no acute distress Skin:  Warm and dry Neck:  Midline trachea, normal thyroid, good ROM, no lymphadenopathy Lungs; Clear to auscultation bilaterally Breast:  No dominant palpable mass, retraction, or nipple discharge, well healed scars Cardiovascular: Regular rate and rhythm Abdomen:  Soft, non tender, no hepatosplenomegaly Pelvic:  External genitalia is normal in appearance, no lesions.  The vagina is normal in appearance. Urethra has no lesions or masses. The cervix is smooth and  bulbous.  Uterus is felt to be normal size, shape, and contour.  No adnexal masses or tenderness noted.Bladder is non tender, no masses felt. Rectal: Good sphincter tone, no polyps, or hemorrhoids felt.  Hemoccult negative. Extremities/musculoskeletal:  No swelling or varicosities noted, no clubbing or cyanosis Psych:  No mood changes, alert and cooperative,seems happy Fall risk is low PHQ 2 score is 0.  Examination chaperoned  by Rolena Infante LPN  Impression and Plan:   1. Encounter for well woman exam with routine gynecological exam Pap and physical in 1 year  2. Screening for colorectal cancer Colonoscopy at 50  3. History of breast cancer Mammogram yearly  4. Hypertension, unspecified type Continue Norvasc and follow up with [facility] doctor and check BP at home

## 2019-12-25 ENCOUNTER — Telehealth: Payer: Self-pay | Admitting: Physical Therapy

## 2019-12-25 NOTE — Telephone Encounter (Signed)
Pt called out office asking for a return call. Called pt today, but there was not answer,so I left a message stating I would try again tomorrow. Maudry Diego, PT 12/25/2019 @ 3:53 PM

## 2020-01-18 ENCOUNTER — Ambulatory Visit: Payer: BC Managed Care – PPO | Attending: Internal Medicine

## 2020-01-18 DIAGNOSIS — Z23 Encounter for immunization: Secondary | ICD-10-CM

## 2020-01-18 NOTE — Progress Notes (Signed)
   Covid-19 Vaccination Clinic  Name:  Jessica Simpson    MRN: [id number] DOB: September 27, 1971  01/18/2020  [name] was observed post Covid-19 immunization for 15 minutes without incident. She was provided with Vaccine Information Sheet and instruction to access the V-Safe system.   [name] was instructed to call 911 with any severe reactions post vaccine: Marland Kitchen Difficulty breathing  . Swelling of face and throat  . A fast heartbeat  . A bad rash all over body  . Dizziness and weakness   Immunizations Administered    Name Date Dose VIS Date Route   Moderna COVID-19 Vaccine 01/18/2020 10:11 AM 0.5 mL 09/25/2019 Intramuscular   Manufacturer: Moderna   Lot: [id number]   [id number]

## 2020-02-20 ENCOUNTER — Ambulatory Visit: Payer: BC Managed Care – PPO | Attending: Internal Medicine

## 2020-02-20 DIAGNOSIS — Z23 Encounter for immunization: Secondary | ICD-10-CM

## 2020-02-20 NOTE — Progress Notes (Signed)
   Covid-19 Vaccination Clinic  Name:  Jessica Simpson    MRN: [id number] DOB: [birth year]  02/20/2020  [name] was observed post Covid-19 immunization for 15 minutes without incident. She was provided with Vaccine Information Sheet and instruction to access the V-Safe system.   [name] was instructed to call 911 with any severe reactions post vaccine: Marland Kitchen Difficulty breathing  . Swelling of face and throat  . A fast heartbeat  . A bad rash all over body  . Dizziness and weakness   Immunizations Administered    Name Date Dose VIS Date Route   Moderna COVID-19 Vaccine 02/20/2020 10:34 AM 0.5 mL 09/2019 Intramuscular   Manufacturer: Moderna   Lot: [id number]   [id number]

## 2020-04-01 ENCOUNTER — Telehealth: Payer: Self-pay | Admitting: *Deleted

## 2020-04-01 ENCOUNTER — Other Ambulatory Visit: Payer: Self-pay | Admitting: Hematology and Oncology

## 2020-04-01 DIAGNOSIS — I890 Lymphedema, not elsewhere classified: Secondary | ICD-10-CM

## 2020-04-01 NOTE — Telephone Encounter (Signed)
Jessica Simpson states she has been out of work ~ 2 years. States she still has lymphedema and has tried therapy without success. Her manager would to talk to Jessica Simpson directly regarding her RTW. 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 She has mixed feelings as to whether or not she can return to work.

## 2020-04-01 NOTE — Telephone Encounter (Signed)
Dr Lindi Adie placed an order for lymphedema evaluation for potential RTW. Discussed with patient.

## 2020-04-14 ENCOUNTER — Ambulatory Visit: Payer: BC Managed Care – PPO | Admitting: Physical Therapy

## 2020-04-15 ENCOUNTER — Encounter: Payer: Self-pay | Admitting: Physical Therapy

## 2020-04-15 ENCOUNTER — Ambulatory Visit: Payer: BC Managed Care – PPO | Attending: Hematology and Oncology | Admitting: Physical Therapy

## 2020-04-15 ENCOUNTER — Other Ambulatory Visit: Payer: Self-pay

## 2020-04-15 DIAGNOSIS — L599 Disorder of the skin and subcutaneous tissue related to radiation, unspecified: Secondary | ICD-10-CM | POA: Diagnosis present

## 2020-04-15 DIAGNOSIS — M79602 Pain in left arm: Secondary | ICD-10-CM | POA: Insufficient documentation

## 2020-04-15 DIAGNOSIS — G8929 Other chronic pain: Secondary | ICD-10-CM | POA: Diagnosis present

## 2020-04-15 DIAGNOSIS — M25612 Stiffness of left shoulder, not elsewhere classified: Secondary | ICD-10-CM | POA: Diagnosis present

## 2020-04-15 DIAGNOSIS — M542 Cervicalgia: Secondary | ICD-10-CM | POA: Diagnosis present

## 2020-04-15 DIAGNOSIS — M25511 Pain in right shoulder: Secondary | ICD-10-CM | POA: Diagnosis present

## 2020-04-15 DIAGNOSIS — M25611 Stiffness of right shoulder, not elsewhere classified: Secondary | ICD-10-CM | POA: Insufficient documentation

## 2020-04-15 NOTE — Therapy (Addendum)
[place], [place], [address] Phone: [phone]   Fax:  [phone]  Physical Therapy Evaluation  Patient Details  Name: Jessica Simpson MRN: [id number] Date of Birth: 08/02/1971 Referring Provider (PT): Dr Lindi Adie    Encounter Date: 04/15/2020   PT End of Session - 04/15/20 2036    Visit Number 1    Number of Visits 9    Date for PT Re-Evaluation 05/15/20    PT Start Time 1410    PT Stop Time 1500    PT Time Calculation (min) 50 min    Activity Tolerance Patient tolerated treatment well           Past Medical History:  Diagnosis Date  . BRCA1 negative 10/22/2011  . BRCA2 negative 10/22/2011  . Breast cancer, IDC, Left, Stage III, Triple negative 06/30/2011  . Breast disorder   . Contraceptive education 01/15/2016  . Family history of breast cancer   . Fracture    right 4th toe  . History of breast cancer 07/22/2014  . History of radiation therapy 05/16/18- 06/22/18   Right Breast/ 50.4 Gy in 28 fractions. Right posterior axilla and SCV nodes/ 50.4 Gy in 28 fractions.   . Hypertension   . Lymphedema 06/15/2012  . Lymphedema of arm   . Personal history of chemotherapy   . Personal history of radiation therapy   . Positive fecal occult blood test 07/22/2014  . S/P radiation therapy 2013   50 gray in 25 fractions to the left breast, supraclavicular, and axillary regions. She then received a boost to the left lumpectomy of 10 gray in 5 fractions. This was given at [place] [facility].  . Status post chemotherapy Comp. 11/23/11   FEC and Taxotere    Past Surgical History:  Procedure Laterality Date  . 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 AXILLARY LYMPH NODE BIOPSY Right 04/06/2018  . AXILLARY LYMPH NODE DISSECTION Right 04/06/2018   Procedure: RIGHT AXILLARY LYMPH NODE DISSECTION;  Surgeon: Rolm Bookbinder, MD;  Location: [facility];   Service: General;  Laterality: Right;  . BREAST LUMPECTOMY Left 2012  . BREAST LUMPECTOMY Right 2019   rt axilla  . BREAST SURGERY    . CESAREAN SECTION    . COLONOSCOPY N/A 08/14/2014   Procedure: COLONOSCOPY;  Surgeon: Rogene Houston, MD;  [facility];  Service: Endoscopy;  Laterality: N/A;  930  . EVACUATION BREAST HEMATOMA  12/21/2011   Procedure: EVACUATION HEMATOMA BREAST;  Surgeon: Stark Klein, MD;  Location: [facility];  Service: General;  Laterality: Left;  . HAND SURGERY     tumor on finger, left hand  . INCISION AND DRAINAGE ABSCESS ANAL     x3  . left breast biopsy with axillary biopsy     core bx  . PORT-A-CATH REMOVAL  12/21/2011   Procedure: REMOVAL PORT-A-CATH;  Surgeon: Haywood Lasso, MD;  Location: [facility];  Service: General;  Laterality: N/A;  . PORTACATH PLACEMENT  08/02/2011   dr Margot Chimes  . PORTACATH PLACEMENT Right 11/14/2017   Procedure: INSERTION PORT-A-CATH WITH [place];  Surgeon: Rolm Bookbinder, MD;  Location: [facility];  Service: General;  Laterality: Right;  . removal breast mass  2004   benign  . TREATMENT FISTULA ANAL    . TUMOR REMOVAL     on left hand  . TUMOR REMOVAL     rt. eye  There were no vitals filed for this visit.    Subjective Assessment - 04/15/20 1427    Subjective Pain in hand and arm that some days gets to the point that she cannot sleep. She has daytime and nighttime compression sleeves and gloves that she wears every day and has the Flexitouch pump at least 5 times a weeks She still has occasional issues with chemotherapy neuropathy. She has most trouble with swelling in her left hand middle 3 fingers. She knows that heat causes problems also    Pertinent History Breast cancer Lt treated in 2012-2013 with chemotherapy/taxotere post lumpectomy and SLNB 3/7 nodes and radiation with onset of lymphedema during radiation, treatment of lymphedema at [facility] in 2013.  Reoccurence in Rt axillary lymph node with  chemotherapy ongoing carboplatin and gemcitabine x 6 cycles and ALND 1/11 positive, radiation completed 06/19/18 on the Rt.  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 No/denies   last night she had pain that kept her from going to sleep                         Outpatient Rehab from 05/23/2019 in [place]  Lymphedema Life Impact Scale Total Score 20.59 %      Objective measurements completed on examination: See above findings.                    PT Long Term Goals - 04/15/20 2049      PT LONG TERM GOAL #1   Title Pt will report the incidence and level of her arm pain is decreased by 50%    Time 4    Period Weeks    Status New      PT LONG TERM GOAL #2   Title Pt will be independent in a home exericse program for postural stretching for work stretch breaks so that she can do her job easier    Time 4    Period Weeks    Status New      PT LONG TERM GOAL #3   Title Pt will know how to get replacement compression garments so that she can continue to manage her lymphedema at home    Time 4    Period [place] - 04/15/20 2037    Clinical Impression Statement Pt comes to PT today concerned that the pain she has especially in her left arm and hand when she tries to use it will keep her from performing her job when she returns in September.  She does alot of typing all day and find that makes the pain and swelling worse. She has been managing her lymphedema since last year with self MLD, compression and Flexitouch pump and it appears to be stable at this time.  She needs new compression garments as they need to be replaced at least yearly, but she does not need compression bandaging or intensive lymphedema treatment.  She does have  significant  muscular tightness in both shoulders and upper back.  Feel she would benefit from dry needling to these areast along with soft tissue work and review of stretching exercises. She  may also benefit from a referral to Pain Management center as her main concern is that her pain will keep her from doing her job.    Personal Factors and Comorbidities Comorbidity 3+    Comorbidities breast cancer in left breast with recurrence in right axillary nodeswith surgery chemo  and radiation, lymphedema , chemotherapy induced peripheral neuropathy    Examination-Activity Limitations Reach Overhead;Carry;Other   typing for work   Stability/Clinical Decision Making Stable/Uncomplicated    Clinical Decision Making Low    Clinical Presentation due to: chronic pain    Rehab Potential Fair    PT Frequency 2x / week    PT Duration 4 weeks    PT Treatment/Interventions ADLs/Self Care Home Management;Therapeutic activities;Therapeutic exercise;Neuromuscular re-education;Patient/family education;Dry needling;Manual techniques;Passive range of motion;Scar mobilization    PT Next Visit Plan dry needling, soft tissue work, exercise and stretching    Consulted and Agree with Plan of Care Patient           Patient will benefit from skilled therapeutic intervention in order to improve the following deficits and impairments:  Pain, Postural dysfunction, Impaired UE functional use, Increased fascial restricitons, Decreased strength, Decreased range of motion, Increased edema, Impaired perceived functional ability, Obesity, Increased muscle spasms  Visit Diagnosis: Disorder of the skin and subcutaneous tissue related to radiation, unspecified - Plan: PT plan of care cert/re-cert  Stiffness of left shoulder, not elsewhere classified - Plan: PT plan of care cert/re-cert  Pain in left arm - Plan: PT plan of care cert/re-cert  Stiffness of right shoulder, not elsewhere classified - Plan: PT plan of care  cert/re-cert  Chronic right shoulder pain  Cervicalgia     Problem List Patient Active Problem List   Diagnosis Date Noted  . Hypertension 09/01/2018  . Screening for colorectal cancer 09/01/2018  . Encounter for gynecological examination with Papanicolaou smear of cervix 09/01/2018  . 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 ([place]) 04/25/2018  . Genetic testing 01/12/2018  . Family history of breast cancer   . Port-A-Cath in place 11/21/2017  . Mass of axillary tail of right breast 08/24/2017  . Pain with urination 08/24/2017  . Urinary frequency 08/24/2017  . Hematuria 08/24/2017  . Encounter for well woman exam with routine gynecological exam 01/15/2016  . Benign cyst of right breast 11/29/2015  . Positive fecal occult blood test 07/22/2014  . History of breast cancer 07/22/2014  . Rectal bleeding 07/10/2014  . Lymphedema 06/15/2012  . BRCA1 negative 10/22/2011  . BRCA2 negative 10/22/2011  . Cancer of left breast ([place]) 06/30/2011    Class: Diagnosis of   Donato Heinz. Owens Shark PT  Norwood Levo 04/16/2020, 4:28 PM  [place], [place], [address] Phone: [phone]   Fax:  [phone]  Name: Jessica Simpson MRN: [id number] Date of Birth: 27-Jun-1971

## 2020-05-05 ENCOUNTER — Encounter: Payer: Self-pay | Admitting: Physical Therapy

## 2020-05-05 ENCOUNTER — Other Ambulatory Visit: Payer: Self-pay

## 2020-05-05 ENCOUNTER — Ambulatory Visit: Payer: Medicare Other | Attending: Hematology and Oncology | Admitting: Physical Therapy

## 2020-05-05 DIAGNOSIS — M542 Cervicalgia: Secondary | ICD-10-CM | POA: Diagnosis present

## 2020-05-05 DIAGNOSIS — M25511 Pain in right shoulder: Secondary | ICD-10-CM | POA: Diagnosis present

## 2020-05-05 DIAGNOSIS — G8929 Other chronic pain: Secondary | ICD-10-CM | POA: Diagnosis present

## 2020-05-05 DIAGNOSIS — M79602 Pain in left arm: Secondary | ICD-10-CM | POA: Diagnosis present

## 2020-05-05 DIAGNOSIS — M25611 Stiffness of right shoulder, not elsewhere classified: Secondary | ICD-10-CM | POA: Diagnosis present

## 2020-05-05 DIAGNOSIS — L599 Disorder of the skin and subcutaneous tissue related to radiation, unspecified: Secondary | ICD-10-CM

## 2020-05-05 DIAGNOSIS — M25612 Stiffness of left shoulder, not elsewhere classified: Secondary | ICD-10-CM | POA: Diagnosis present

## 2020-05-05 NOTE — Therapy (Signed)
[place], [place], [address] Phone: [phone]   Fax:  [phone]  Physical Therapy Treatment  Patient Details  Name: Jessica Simpson MRN: [id number] Date of Birth: 1971-05-07 Referring Provider (PT): Dr Lindi Adie    Encounter Date: 05/05/2020   PT End of Session - 05/05/20 1257    Visit Number 2    Number of Visits 9    Date for PT Re-Evaluation 05/15/20    PT Start Time 1108   arrived several minutes late   PT Stop Time 1148   time spent for dry needling not included in direct timed minutes   PT Time Calculation (min) 40 min    Activity Tolerance Patient tolerated treatment well    Behavior During Therapy [facility] for tasks assessed/performed           Past Medical History:  Diagnosis Date  . BRCA1 negative 10/22/2011  . BRCA2 negative 10/22/2011  . Breast cancer, IDC, Left, Stage III, Triple negative 06/30/2011  . Breast disorder   . Contraceptive education 01/15/2016  . Family history of breast cancer   . Fracture    right 4th toe  . History of breast cancer 07/22/2014  . History of radiation therapy 05/16/18- 06/22/18   Right Breast/ 50.4 Gy in 28 fractions. Right posterior axilla and SCV nodes/ 50.4 Gy in 28 fractions.   . Hypertension   . Lymphedema 06/15/2012  . Lymphedema of arm   . Personal history of chemotherapy   . Personal history of radiation therapy   . Positive fecal occult blood test 07/22/2014  . S/P radiation therapy 2013   50 gray in 25 fractions to the left breast, supraclavicular, and axillary regions. She then received a boost to the left lumpectomy of 10 gray in 5 fractions. This was given at [facility].  . Status post chemotherapy Comp. 11/23/11   FEC and Taxotere    Past Surgical History:  Procedure Laterality Date  . 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 AXILLARY LYMPH NODE BIOPSY Right 04/06/2018  .  AXILLARY LYMPH NODE DISSECTION Right 04/06/2018   Procedure: RIGHT AXILLARY LYMPH NODE DISSECTION;  Surgeon: Rolm Bookbinder, MD;  Location: [facility];  Service: General;  Laterality: Right;  . BREAST LUMPECTOMY Left 2012  . BREAST LUMPECTOMY Right 2019   rt axilla  . BREAST SURGERY    . CESAREAN SECTION    . COLONOSCOPY N/A 08/14/2014   Procedure: COLONOSCOPY;  Surgeon: Rogene Houston, MD;  [facility];  Service: Endoscopy;  Laterality: N/A;  930  . EVACUATION BREAST HEMATOMA  12/21/2011   Procedure: EVACUATION HEMATOMA BREAST;  Surgeon: Stark Klein, MD;  Location: [facility];  Service: General;  Laterality: Left;  . HAND SURGERY     tumor on finger, left hand  . INCISION AND DRAINAGE ABSCESS ANAL     x3  . left breast biopsy with axillary biopsy     core bx  . PORT-A-CATH REMOVAL  12/21/2011   Procedure: REMOVAL PORT-A-CATH;  Surgeon: Haywood Lasso, MD;  Location: [facility];  Service: General;  Laterality: N/A;  . PORTACATH PLACEMENT  08/02/2011   dr Margot Chimes  . PORTACATH PLACEMENT Right 11/14/2017   Procedure: INSERTION PORT-A-CATH WITH [place];  Surgeon: Rolm Bookbinder, MD;  Location: [facility];  Service: General;  Laterality: Right;  . removal breast mass  2004   benign  . TREATMENT  FISTULA ANAL    . TUMOR REMOVAL     on left hand  . TUMOR REMOVAL     rt. eye    There were no vitals filed for this visit.   Subjective Assessment - 05/05/20 1252    Subjective Pt. continues with tightness in upper back, bilateral upper trapezius and notes tightness with shoulder flexion ROM for overhead reaching. Still having arm and hand pain as well-no word yet on possible follow up with pain management.    Pertinent History Breast cancer Lt treated in 2012-2013 with chemotherapy/taxotere post lumpectomy and SLNB 3/7 nodes and radiation with onset of lymphedema during radiation, treatment of l[facility]hedema at [facility] in 2013.  Reoccurence in Rt axillary lymph node with  chemotherapy ongoing carboplatin and gemcitabine x 6 cycles and ALND 1/11 positive, radiation completed 06/19/18 on the Rt.  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 upper trapezius and levator region      Neck Exercises: Stretches   Upper Trapezius Stretch Right;Left;2 reps;30 seconds    Levator Stretch Right;Left;2 reps;30 seconds    Chest Stretch Limitations supine manual pec minor stretch 2x30 sec            Trigger Point Dry Needling - 05/05/20 0001    Consent Given? Yes    Education Handout Provided Yes    Muscles Treated Head and Neck Upper trapezius;Levator scapulae    Muscles Treated Upper Quadrant Infraspinatus;Latissimus dorsi;Teres major    Dry Needling Comments needling in prone to bilatl upper trapezius, levator and latissimus and to left side only infraspinatus and teres major, 30-32 gauge 30 and 50 mm needles used    Upper Trapezius Response Twitch reponse elicited                PT Education - 05/05/20 1255    Education Details dry needling, HEP    Person(s) Educated Patient    Methods Explanation;Demonstration;Verbal cues;Handout    Comprehension Verbalized understanding               PT Long Term Goals - 04/15/20 2049      PT LONG TERM GOAL #1   Title Pt will report the incidence and level of her arm pain is decreased by 50%    Time 4    Period Weeks    Status New      PT LONG TERM GOAL #2   Title Pt will be independent  in a home exericse program for postural stretching for work stretch breaks so that she can do her job easier    Time 4    Period Weeks    Status New      PT LONG TERM GOAL #3   Title Pt will know how to get replacement compression garments so that she can continue  to manage her lymphedema at home    Time 4    Period Weeks    Status New                 Plan - 05/05/20 1259    Clinical Impression Statement Trial dry needling today to upper thoracic/cervical region to address muscle tightness/myofascial pain with good tolerance-given symptom etiology and chronicity expect progress will be gradual. Instructed in HEP for stretches/ROM (see pt. instructions) to help with muscle tightness as well. Plan continue PT to help address associated functional limitations for reaching ability and positional tolerance.    Personal Factors and Comorbidities Comorbidity 3+    Comorbidities breast cancer in left breast with recurrence in right axillary nodeswith surgery chemo  and radiation, lymphedema , chemotherapy induced peripheral neuropathy    Examination-Activity Limitations Reach Overhead;Carry;Other    Stability/Clinical Decision Making Stable/Uncomplicated    Clinical Decision Making Low    Rehab Potential Fair    PT Frequency 2x / week    PT Duration 4 weeks    PT Treatment/Interventions ADLs/Self Care Home Management;Therapeutic activities;Therapeutic exercise;Neuromuscular re-education;Patient/family education;Dry needling;Manual techniques;Passive range of motion;Scar mobilization    PT Next Visit Plan dry needling, soft tissue work, exercise and stretching    Consulted and Agree with Plan of Care Patient           Patient will benefit from skilled therapeutic intervention in order to improve the following deficits and impairments:  Pain, Postural dysfunction, Impaired UE functional use, Increased fascial restricitons, Decreased strength, Decreased range of motion, Increased edema,  Impaired perceived functional ability, Obesity, Increased muscle spasms  Visit Diagnosis: Disorder of the skin and subcutaneous tissue related to radiation, unspecified  Stiffness of left shoulder, not elsewhere classified  Pain in left arm  Stiffness of right shoulder, not elsewhere classified  Chronic right shoulder pain  Cervicalgia     Problem List Patient Active Problem List   Diagnosis Date Noted  . Hypertension 09/01/2018  . Screening for colorectal cancer 09/01/2018  . Encounter for gynecological examination with Papanicolaou smear of cervix 09/01/2018  . 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 ([place]) 04/25/2018  . Genetic testing 01/12/2018  . Family history of breast cancer   . Port-A-Cath in place 11/21/2017  . Mass of axillary tail of right breast 08/24/2017  . Pain with urination 08/24/2017  . Urinary frequency 08/24/2017  . Hematuria 08/24/2017  . Encounter for well woman exam with routine gynecological exam 01/15/2016  . Benign cyst of right breast 11/29/2015  . Positive fecal occult blood test 07/22/2014  . History of breast cancer 07/22/2014  . Rectal bleeding 07/10/2014  . Lymphedema 06/15/2012  . BRCA1 negative 10/22/2011  . BRCA2 negative 10/22/2011  . Cancer of left breast ([place]) 06/30/2011    Class: Diagnosis of    Beaulah Dinning, PT, DPT 05/05/20 1:03 PM  [facility] [address] [place], [place], [address] Phone: [phone]   Fax:  [phone]  Name: Jessica Simpson MRN: [id number] Date of Birth: 08-16-1971

## 2020-05-05 NOTE — Patient Instructions (Addendum)

## 2020-05-16 ENCOUNTER — Ambulatory Visit: Payer: Medicare Other | Admitting: Physical Therapy

## 2020-05-16 ENCOUNTER — Encounter: Payer: Self-pay | Admitting: Physical Therapy

## 2020-05-16 ENCOUNTER — Other Ambulatory Visit: Payer: Self-pay

## 2020-05-16 DIAGNOSIS — M542 Cervicalgia: Secondary | ICD-10-CM

## 2020-05-16 DIAGNOSIS — M79602 Pain in left arm: Secondary | ICD-10-CM

## 2020-05-16 DIAGNOSIS — M25611 Stiffness of right shoulder, not elsewhere classified: Secondary | ICD-10-CM

## 2020-05-16 DIAGNOSIS — L599 Disorder of the skin and subcutaneous tissue related to radiation, unspecified: Secondary | ICD-10-CM | POA: Diagnosis not present

## 2020-05-16 DIAGNOSIS — G8929 Other chronic pain: Secondary | ICD-10-CM

## 2020-05-16 DIAGNOSIS — M25612 Stiffness of left shoulder, not elsewhere classified: Secondary | ICD-10-CM

## 2020-05-16 NOTE — Therapy (Signed)
[place], [place], [address] Phone: [phone]   Fax:  [phone]  Physical Therapy Treatment/Recertification  Patient Details  Name: Jessica Simpson MRN: [id number] Date of Birth: [birth year] Referring Provider (PT): Dr Lindi Adie    Encounter Date: 05/16/2020   PT End of Session - 05/16/20 1023    Visit Number 3    Number of Visits 11    Date for PT Re-Evaluation 06/13/20    PT Start Time 0932    PT Stop Time 1015   time spent for dry needling not included in direct tx. minutes   PT Time Calculation (min) 43 min    Activity Tolerance Patient tolerated treatment well    Behavior During Therapy [facility] for tasks assessed/performed           Past Medical History:  Diagnosis Date  . BRCA1 negative 10/22/2011  . BRCA2 negative 10/22/2011  . Breast cancer, IDC, Left, Stage III, Triple negative 06/30/2011  . Breast disorder   . Contraceptive education 01/15/2016  . Family history of breast cancer   . Fracture    right 4th toe  . History of breast cancer 07/22/2014  . History of radiation therapy 05/16/18- 06/22/18   Right Breast/ 50.4 Gy in 28 fractions. Right posterior axilla and SCV nodes/ 50.4 Gy in 28 fractions.   . Hypertension   . Lymphedema 06/15/2012  . Lymphedema of arm   . Personal history of chemotherapy   . Personal history of radiation therapy   . Positive fecal occult blood test 07/22/2014  . S/P radiation therapy 2013   50 gray in 25 fractions to the left breast, supraclavicular, and axillary regions. She then received a boost to the left lumpectomy of 10 gray in 5 fractions. This was given at [place].  . Status post chemotherapy Comp. 11/23/11   FEC and Taxotere    Past Surgical History:  Procedure Laterality Date  . 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 AXILLARY LYMPH NODE BIOPSY Right 04/06/2018  . AXILLARY LYMPH NODE  DISSECTION Right 04/06/2018   Procedure: RIGHT AXILLARY LYMPH NODE DISSECTION;  Surgeon: Rolm Bookbinder, MD;  Location: [facility];  Service: General;  Laterality: Right;  . BREAST LUMPECTOMY Left 2012  . BREAST LUMPECTOMY Right 2019   rt axilla  . BREAST SURGERY    . CESAREAN SECTION    . COLONOSCOPY N/A 08/14/2014   Procedure: COLONOSCOPY;  Surgeon: Rogene Houston, MD;  [facility];  Service: Endoscopy;  Laterality: N/A;  930  . EVACUATION BREAST HEMATOMA  12/21/2011   Procedure: EVACUATION HEMATOMA BREAST;  Surgeon: Stark Klein, MD;  Location: [facility];  Service: General;  Laterality: Left;  . HAND SURGERY     tumor on finger, left hand  . INCISION AND DRAINAGE ABSCESS ANAL     x3  . left breast biopsy with axillary biopsy     core bx  . PORT-A-CATH REMOVAL  12/21/2011   Procedure: REMOVAL PORT-A-CATH;  Surgeon: Haywood Lasso, MD;  Location: [facility];  Service: General;  Laterality: N/A;  . PORTACATH PLACEMENT  08/02/2011   dr Margot Chimes  . PORTACATH PLACEMENT Right 11/14/2017   Procedure: INSERTION PORT-A-CATH WITH [place];  Surgeon: Rolm Bookbinder, MD;  Location: [facility];  Service: General;  Laterality: Right;  . removal breast mass  2004   benign  . TREATMENT FISTULA ANAL    .  TUMOR REMOVAL     on left hand  . TUMOR REMOVAL     rt. eye    There were no vitals filed for this visit.   Subjective Assessment - 05/16/20 0934    Subjective Pt. presents for 3rd PT session today with some delay in returning due to schedule availabilty. She reports some initial soreness after last session with trial dry needling but reports did have some improvement with decreased muscle tension. Pain 3/10 mostly left upper trapezius and periscapular region with associated tightness.    Pertinent History Breast cancer Lt treated in 2012-2013 with chemotherapy/taxotere post lumpectomy and SLNB 3/7 nodes and radiation with onset of lymphedema during radiation, treatment of  lymphedema at [facility] in 2013.  Reoccurence in Rt axillary lymph node with chemotherapy ongoing carboplatin and gemcitabine x 6 cycles and ALND 1/11 positive, radiation completed 06/19/18 on the Rt.  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 the hands, does experience fatigue     Patient Stated Goals to get help with the pain so that she can go back to work              [facility] PT Assessment - 05/16/20 0001      AROM   AROM Assessment Site Cervical    Right Shoulder Flexion 140 Degrees    Right Shoulder ABduction 140 Degrees    Right Shoulder External Rotation 60 Degrees    Left Shoulder Flexion 155 Degrees    Left Shoulder ABduction 40 Degrees    Left Shoulder External Rotation 60 Degrees    Cervical Flexion 48    Cervical Extension 20    Cervical - Right Side Bend 30    Cervical - Left Side Bend 30    Cervical - Right Rotation 42    Cervical - Left Rotation 40      Strength   Right Shoulder Flexion 5/5    Right Shoulder ABduction 5/5    Right Shoulder Internal Rotation 5/5    Right Shoulder External Rotation 4+/5    Left Shoulder Flexion 4+/5    Left Shoulder ABduction 4+/5    Left Shoulder Internal Rotation 5/5    Left Shoulder External Rotation 4+/5                   Outpatient Re[facility]23/[facility]  Lymphedema Life Impact Scale Total Score 20.59 %            [facility] Adult PT Treatment/Exercise - 05/16/20 0001      Shoulder Exercises: Supine   External Rotation Both;15 reps    Theraband Level (Shoulder External Rotation) Level 3 (Green)      Manual Therapy   Soft tissue mobilization STM in prone left levator and upper trapezius region focus      Neck Exercises: Stretches   Upper Trapezius Stretch Left;3 reps;30 seconds    Levator Stretch Left;3 reps;30 seconds    Chest Stretch Limitations supine manual pec minor stretch 3x30 sec            Trigger Point Dry Needling - 05/16/20 0001     Consent Given? Yes    Muscles Treated Head and Neck Upper trapezius;Levator scapulae    Muscles Treated Upper Quadrant Infraspinatus;Latissimus dorsi;Teres major;Teres minor    Dry Needling Comments needling in prone with 32 gauge 50 mm needles                PT Education - 05/16/20  32    Education Details HEP-stretches, POC, Theracane use for self-trigger point release    Person(s) Educated Patient    Methods Explanation;Demonstration;Verbal cues;Handout    Comprehension Verbalized understanding;Returned demonstration               PT Long Term Goals - 05/16/20 1028      PT LONG TERM GOAL #1   Title Pt will report the incidence and level of her arm pain is decreased by 50%    Baseline ongoing    Time 4    Period Weeks    Status On-going    Target Date 06/13/20      PT LONG TERM GOAL #2   Title Pt will be independent in a home exericse program for postural stretching for work stretch breaks so that she can do her job easier    Baseline updates today, updates ongoing    Time 4    Period Weeks    Status On-going    Target Date 06/13/20      PT LONG TERM GOAL #3   Title Pt will know how to get replacement compression garments so that she can continue to manage her lymphedema at home    Time 4    Period Weeks    Status On-going    Target Date 06/13/20      PT LONG TERM GOAL #4   Title Increase bilat. cervical rotation AROM to at least 50 deg to improve ability to turn head while driving    Baseline see objective    Time 4    Period Weeks    Status New    Target Date 06/13/20                 Plan - 05/16/20 1024    Clinical Impression Statement Limited attendance as noted in subjective due to scheduling difficulties but mild improvement noted with early sessions. Given symptom etiology and chronicity expect progress for improvement will be gradual. Symptoms overall consistent with myofascial etiology with CA/surgical history. Plan continue PT for  further progress to help relieve pain and address associated functional limitations.    Personal Factors and Comorbidities Comorbidity 3+    Comorbidities breast cancer in left breast with recurrence in right axillary nodeswith surgery chemo  and radiation, lymphedema , chemotherapy induced peripheral neuropathy    Examination-Activity Limitations Reach Overhead;Carry;Other    Stability/Clinical Decision Making Stable/Uncomplicated    Clinical Decision Making Low    Rehab Potential Fair    PT Frequency 2x / week    PT Duration 4 weeks    PT Treatment/Interventions ADLs/Self Care Home Management;Therapeutic activities;Therapeutic exercise;Neuromuscular re-education;Patient/family education;Dry needling;Manual techniques;Passive range of motion;Scar mobilization    PT Next Visit Plan dry needling, soft tissue work, exercise and stretching    PT Home Exercise Plan [phone]    Consulted and Agree with Plan of Care Patient           Patient will benefit from skilled therapeutic intervention in order to improve the following deficits and impairments:  Pain, Postural dysfunction, Impaired UE functional use, Increased fascial restricitons, Decreased strength, Decreased range of motion, Increased edema, Impaired perceived functional ability, Obesity, Increased muscle spasms  Visit Diagnosis: Disorder of the skin and subcutaneous tissue related to radiation, unspecified  Stiffness of left shoulder, not elsewhere classified  Pain in left arm  Stiffness of right shoulder, not elsewhere classified  Chronic right shoulder pain  Cervicalgia     Problem List Patient Active Problem  List   Diagnosis Date Noted  . Hypertension 09/01/2018  . Screening for colorectal cancer 09/01/2018  . Encounter for gynecological examination with Papanicolaou smear of cervix 09/01/2018  . 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 ([place]) 04/25/2018  . Genetic testing 01/12/2018  . Family history of breast  cancer   . Port-A-Cath in place 11/21/2017  . Mass of axillary tail of right breast 08/24/2017  . Pain with urination 08/24/2017  . Urinary frequency 08/24/2017  . Hematuria 08/24/2017  . Encounter for well woman exam with routine gynecological exam 01/15/2016  . Benign cyst of right breast 11/29/2015  . Positive fecal occult blood test 07/22/2014  . History of breast cancer 07/22/2014  . Rectal bleeding 07/10/2014  . Lymphedema 06/15/2012  . BRCA1 negative 10/22/2011  . BRCA2 negative 10/22/2011  . Cancer of left breast ([place]) 06/30/2011    Class: Diagnosis of    Beaulah Dinning, PT, DPT 05/16/20 10:34 AM  [facility] [address] [place], [place], [address] Phone: [phone]   Fax:  [phone]  Name: Jessica Simpson MRN: [id number] Date of Birth: February 19, 1971

## 2020-05-20 NOTE — Progress Notes (Signed)
Patient Care Team: Patient, [name] as PCP - General ([facility]) Neldon Mc, MD as Surgeon (General Surgery) Everardo All, MD (Hematology and Oncology)  DIAGNOSIS:    ICD-10-CM   1. 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, unspecified estrogen receptor status ([place])  C50.012   2. 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, estrogen receptor negative ([place])  C50.611    Z17.1     SUMMARY OF ONCOLOGIC HISTORY: Oncology History  Cancer of left breast ([place])  06/30/2011 Initial Diagnosis   Breast cancer, IDC, Left, Stage III, Triple negative, 7.6 cm breast mass and palpable axillary mass   09/06/2011 Miscellaneous   BRCA 1 and 2: Negative   09/14/2011 - 11/23/2011 Neo-Adjuvant Chemotherapy   Dose dense FEC followed by dose dense Taxotere   12/21/2011 Surgery   Left lumpectomy: High-grade poorly differentiated IDC 1.7 cm with high-grade DCIS, margins negative, 3/7 lymph nodes positive, ER 0%, PR 0%, HER-2 negative ratio 1.33 T1CN1 stage IIb   12/31/2011 - 02/15/2012 Radiation Therapy   Radiation at [facility]   09/26/2017 Relapse/Recurrence   Left breast upper outer quadrant within the lumpectomy bed: Fibrosis no malignancy, right axillary lymph node biopsy metastatic high-grade carcinoma ER 0%, PR 0%, Ki-67 90%, HER-2 negative ratio 1.11 (similar to previous ductal carcinoma)   11/14/2017 - 03/06/2018 Neo-Adjuvant Chemotherapy   Neo-Adjuvant chemotherapy with Gemzar and carboplatin days 1 and 8 every 3 weeks   01/10/2018 Genetic Testing   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  The Hereditary Gene Panel offered by [facility] includes sequencing and/or deletion duplication testing of the following 47 genes: APC, ATM, AXIN2, BARD1, BMPR1A, BRCA1, BRCA2, BRIP1, CDH1, CDK4, CDKN2A (p14ARF), CDKN2A (p16INK4a), CHEK2, CTNNA1, DICER1, EPCAM (Deletion/duplication testing only), GREM1 (promoter region  deletion/duplication testing only), KIT, MEN1, MLH1, MSH2, MSH3, MSH6, MUTYH, NBN, NF1, NHTL1, PALB2, PDGFRA, PMS2, POLD1, POLE, PTEN, RAD50, RAD51C, RAD51D, SDHB, SDHC, SDHD, SMAD4, SMARCA4. STK11, TP53, TSC1, TSC2, and VHL.  The following genes were evaluated for sequence changes only: SDHA and HOXB13 c.251G>A variant only. The report date is January 10, 2018.    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 ([place])  04/06/2018 Surgery   Right axillary lymph node dissection: 1/11 node positive for metastatic high-grade carcinoma   04/25/2018 Initial Diagnosis   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 ([place])   04/25/2018 Cancer Staging   Staging form: Breast, AJCC 8th Edition - Clinical: Stage IIB (cT0, cN1, cM0, G3, ER-, PR-, HER2-) - Signed by Eppie Gibson, MD on 04/25/2018   05/16/2018 - 06/22/2018 Radiation Therapy   Adjuvant radiation therapy with Xeloda   07/11/2018 - 10/24/2018 Chemotherapy   Adjuvant chemotherapy with CMF     CHIEF COMPLIANT: Follow-up of triple negative right breast cancer   INTERVAL HISTORY: Jessica Simpson is a 49 y.o. with above-mentioned history of triple negative left breast cancer in 2012, followed by a recurrence of the cancer in the axillary tail of the right breast for which she underwent a right axillary lymph node dissection, radiation, and adjuvant chemotherapy.Mammogram on 10/01/19 showed no evidence of malignancy bilaterally. She presents to the clinicalonetoday for follow-up.   ALLERGIES:  is allergic to codeine and lortab [hydrocodone-acetaminophen].  MEDICATIONS:  Current Outpatient Medications  Medication Sig Dispense Refill  . acetaminophen (TYLENOL) 500 MG tablet Take 500 mg by mouth every 6 (six) hours as needed. For pain     . amLODipine (NORVASC) 5  MG tablet Take 5 mg by mouth daily.  6  . Cholecalciferol 5000 units capsule Take 1 capsule (5,000 Units total) by mouth daily.    . vitamin B-12 (CYANOCOBALAMIN) 500 MCG tablet Take  500 mcg by mouth daily.     No current facility-administered medications for this visit.   Facility-Administered Medications Ordered in Other Visits  Medication Dose Route Frequency Provider Last Rate Last Admin  . lactated ringers infusion    Continuous PRN Kerby Less, CRNA   New Bag at 11/14/17 0700    PHYSICAL EXAMINATION: ECOG PERFORMANCE STATUS: 1 - Symptomatic but completely ambulatory  There were no vitals filed for this visit. There were no vitals filed for this visit.  BREAST: No palpable masses or nodules in either right or left breasts. No palpable axillary supraclavicular or infraclavicular adenopathy no breast tenderness or nipple discharge. (exam performed in the presence of a chaperone)  LABORATORY DATA:  I have reviewed the data as listed CMP Latest Ref Rng & Units 12/21/2018 10/24/2018 10/03/2018  Glucose 70 - 99 mg/dL 78 92 131(H)  BUN 6 - 20 mg/dL 10 10 12   Creatinine 0.44 - 1.00 mg/dL 0.89 0.82 0.98  Sodium 135 - 145 mmol/L 140 140 141  Potassium 3.5 - 5.1 mmol/L 3.6 4.0 3.5  Chloride 98 - 111 mmol/L 104 104 106  CO2 22 - 32 mmol/L 25 26 27   Calcium 8.9 - 10.3 mg/dL 9.5 9.5 9.6  Total Protein 6.5 - 8.1 g/dL 7.7 7.3 7.3  Total Bilirubin 0.3 - 1.2 mg/dL 0.4 0.3 0.3  Alkaline Phos 38 - 126 U/L 128(H) 119 109  AST 15 - 41 U/L 15 14(L) 17  ALT 0 - 44 U/L 14 14 20     Lab Results  Component Value Date   WBC 5.6 12/21/2018   HGB 12.0 12/21/2018   HCT 37.6 12/21/2018   MCV 81.0 12/21/2018   PLT 266 12/21/2018   NEUTROABS 3.7 12/21/2018    ASSESSMENT & PLAN:  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 ([place]) Cancer of left breast ([place]) Prior left breast cancer 2012: Treated with neoadjuvant FEC-Taxotere status post lumpectomy radiation. Recurrence: Right axillary lymph node. Neoadjuvant chemo carboplatin and gemcitabine x6 cycles Right axillary lymph node dissection 1/11 lymph nodes positive Adjuvant radiation with Xelodacompleted  06/19/18 Completed 6 cycles of CMF adjuvant chemo12/31/2019  Breast cancer surveillance: 1.Mammogram: October 01, 2019: Benign breast density category C 2.Breast exam May 21, 2020: Benign   Lymphedema bilateral arms: Better but she continues to have pain in the arm.  She is trying dry needling Severe uncontrolled hypertension: Much improved with adjustment of her blood pressure medications  Scans to be done on an as-needed basis Return to clinic in 1 year for follow-up    No orders of the defined types were placed in this encounter.  The patient has a good understanding of the overall plan. she agrees with it. she will call with any problems that may develop before the next visit here.  Total time spent: 20 mins including face to face time and time spent for planning, charting and coordination of care  Nicholas Lose, MD 05/21/2020  I, Cloyde Reams Dorshimer, am acting as scribe for Dr. Nicholas Lose.  I have reviewed the above documentation for accuracy and completeness, and I agree with the above.

## 2020-05-21 ENCOUNTER — Other Ambulatory Visit: Payer: Self-pay

## 2020-05-21 ENCOUNTER — Inpatient Hospital Stay: Payer: BC Managed Care – PPO | Attending: Hematology and Oncology | Admitting: Hematology and Oncology

## 2020-05-21 VITALS — BP 141/93 | HR 73 | Temp 98.7°F | Resp 17 | Ht 66.0 in | Wt 236.6 lb

## 2020-05-21 DIAGNOSIS — I890 Lymphedema, not elsewhere classified: Secondary | ICD-10-CM | POA: Diagnosis not present

## 2020-05-21 DIAGNOSIS — Z79899 Other long term (current) drug therapy: Secondary | ICD-10-CM | POA: Diagnosis not present

## 2020-05-21 DIAGNOSIS — C50611 Malignant neoplasm of axillary tail of right female breast: Secondary | ICD-10-CM | POA: Diagnosis not present

## 2020-05-21 DIAGNOSIS — M79602 Pain in left arm: Secondary | ICD-10-CM | POA: Diagnosis not present

## 2020-05-21 DIAGNOSIS — Z9221 Personal history of antineoplastic chemotherapy: Secondary | ICD-10-CM | POA: Insufficient documentation

## 2020-05-21 DIAGNOSIS — Z171 Estrogen receptor negative status [ER-]: Secondary | ICD-10-CM | POA: Diagnosis not present

## 2020-05-21 DIAGNOSIS — C50012 Malignant neoplasm of nipple and areola, left female breast: Secondary | ICD-10-CM | POA: Diagnosis present

## 2020-05-21 DIAGNOSIS — M79601 Pain in right arm: Secondary | ICD-10-CM | POA: Insufficient documentation

## 2020-05-21 DIAGNOSIS — Z923 Personal history of irradiation: Secondary | ICD-10-CM | POA: Insufficient documentation

## 2020-05-21 NOTE — Assessment & Plan Note (Signed)
Cancer of left breast [facility]) Prior left breast cancer 2012: Treated with neoadjuvant FEC-Taxotere status post lumpectomy radiation. Recurrence: Right axillary lymph node. Neoadjuvant chemo carboplatin and gemcitabine x6 cycles Right axillary lymph node dissection 1/11 lymph nodes positive Adjuvant radiation with Xelodacompleted 06/19/18 Completed 6 cycles of CMF adjuvant chemo12/31/2019  Breast cancer surveillance: 1.Mammogram: October 01, 2019: Benign breast density category C 2.Breast exam May 21, 2020: Benign   Lymphedema bilateral arms:  Severe uncontrolled hypertension  Return to clinic in 1 year for follow-up

## 2020-05-30 ENCOUNTER — Ambulatory Visit: Payer: Medicare Other | Attending: Hematology and Oncology | Admitting: Physical Therapy

## 2020-05-30 ENCOUNTER — Encounter: Payer: Self-pay | Admitting: Physical Therapy

## 2020-05-30 ENCOUNTER — Other Ambulatory Visit: Payer: Self-pay

## 2020-05-30 DIAGNOSIS — M542 Cervicalgia: Secondary | ICD-10-CM

## 2020-05-30 DIAGNOSIS — G8929 Other chronic pain: Secondary | ICD-10-CM | POA: Diagnosis present

## 2020-05-30 DIAGNOSIS — M25611 Stiffness of right shoulder, not elsewhere classified: Secondary | ICD-10-CM | POA: Diagnosis present

## 2020-05-30 DIAGNOSIS — L599 Disorder of the skin and subcutaneous tissue related to radiation, unspecified: Secondary | ICD-10-CM

## 2020-05-30 DIAGNOSIS — M79602 Pain in left arm: Secondary | ICD-10-CM | POA: Diagnosis present

## 2020-05-30 DIAGNOSIS — M25612 Stiffness of left shoulder, not elsewhere classified: Secondary | ICD-10-CM | POA: Diagnosis present

## 2020-05-30 DIAGNOSIS — M25511 Pain in right shoulder: Secondary | ICD-10-CM | POA: Diagnosis present

## 2020-05-30 NOTE — Therapy (Signed)
[place], [place], [address] Phone: [phone]   Fax:  [phone]  Physical Therapy Treatment  Patient Details  Name: Jessica Simpson MRN: [id number] Date of Birth: [birth year] Referring Provider (PT): Dr Lindi Adie    Encounter Date: 05/30/2020   PT End of Session - 05/30/20 1046    Visit Number 4    Number of Visits 11    Date for PT Re-Evaluation 06/13/20    PT Start Time 1046    PT Stop Time 1129    PT Time Calculation (min) 43 min    Activity Tolerance Patient tolerated treatment well    Behavior During Therapy [facility] for tasks assessed/performed           Past Medical History:  Diagnosis Date  . BRCA1 negative 10/22/2011  . BRCA2 negative 10/22/2011  . Breast cancer, IDC, Left, Stage III, Triple negative 06/30/2011  . Breast disorder   . Contraceptive education 01/15/2016  . Family history of breast cancer   . Fracture    right 4th toe  . History of breast cancer 07/22/2014  . History of radiation therapy 05/16/18- 06/22/18   Right Breast/ 50.4 Gy in 28 fractions. Right posterior axilla and SCV nodes/ 50.4 Gy in 28 fractions.   . Hypertension   . Lymphedema 06/15/2012  . Lymphedema of arm   . Personal history of chemotherapy   . Personal history of radiation therapy   . Positive fecal occult blood test 07/22/2014  . S/P radiation therapy 2013   50 gray in 25 fractions to the left breast, supraclavicular, and axillary regions. She then received a boost to the left lumpectomy of 10 gray in 5 fractions. This was given at [place].  . Status post chemotherapy Comp. 11/23/11   FEC and Taxotere    Past Surgical History:  Procedure Laterality Date  . 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 AXILLARY LYMPH NODE BIOPSY Right 04/06/2018  . AXILLARY LYMPH NODE DISSECTION Right 04/06/2018   Procedure: RIGHT AXILLARY LYMPH NODE DISSECTION;   Surgeon: Rolm Bookbinder, MD;  Location: [place];  Service: General;  Laterality: Right;  . BREAST LUMPECTOMY Left 2012  . BREAST LUMPECTOMY Right 2019   rt axilla  . BREAST SURGERY    . CESAREAN SECTION    . COLONOSCOPY N/A 08/14/2014   Procedure: COLONOSCOPY;  Surgeon: Rogene Houston, MD;  [facility];  Service: Endoscopy;  Laterality: N/A;  930  . EVACUATION BREAST HEMATOMA  12/21/2011   Procedure: EVACUATION HEMATOMA BREAST;  Surgeon: Stark Klein, MD;  Location: [place];  Service: General;  Laterality: Left;  . HAND SURGERY     tumor on finger, left hand  . INCISION AND DRAINAGE ABSCESS ANAL     x3  . left breast biopsy with axillary biopsy     core bx  . PORT-A-CATH REMOVAL  12/21/2011   Procedure: REMOVAL PORT-A-CATH;  Surgeon: Haywood Lasso, MD;  Location: [place];  Service: General;  Laterality: N/A;  . PORTACATH PLACEMENT  08/02/2011   dr Margot Chimes  . PORTACATH PLACEMENT Right 11/14/2017   Procedure: INSERTION PORT-A-CATH WITH [place];  Surgeon: Rolm Bookbinder, MD;  Location: [place];  Service: General;  Laterality: Right;  . removal breast mass  2004   benign  . TREATMENT FISTULA ANAL    . TUMOR REMOVAL     on left hand  .  TUMOR REMOVAL     rt. eye    There were no vitals filed for this visit.   Subjective Assessment - 05/30/20 1047    Subjective "I still have knots and it feels like I move wrong. I dropped my phone and bent down to pick up my phone and I got spasm in my shoulder"    Patient Stated Goals to get help with the pain so that she can go back to work    Currently in Pain? Yes    Pain Score 3     Pain Location Back    Pain Orientation Right    Pain Descriptors / Indicators Aching    Pain Onset More than a month ago    Pain Frequency Intermittent    Aggravating Factors  any activity              [facility] PT Assessment - 05/30/20 0001      Assessment   Medical Diagnosis bilateral breast cancer/ bilateral UE lymphedema     Referring Provider (PT) Dr Lindi Adie                    [facility]  Lymphedema Life Impact Scale Total Score 20.59 %            [facility] Adult PT Treatment/Exercise - 05/30/20 0001      Self-Care   Self-Care Posture    Posture avoid hiking shoulders/ rolling them to avoid over activation of the upper trap and to focus on stretching instead of rolling the shoulders.       Shoulder Exercises: Supine   Other Supine Exercises thoracic extension over pink bolster with hands behind head and elbows adducted. 1 x 15      Manual Therapy   Manual Therapy Joint mobilization;Soft tissue mobilization;Other (comment)    Joint Mobilization Grade III- IV PA T1- T8 and bil inferior rib mob grade III    Soft tissue mobilization IASTM along bil upper trap/ levator scapulae and rhomboids in prone    Other Manual Therapy bil upper trap inhibition taping      Neck Exercises: Stretches   Upper Trapezius Stretch 2 reps;Right;Left;30 seconds    Levator Stretch Right;Left;2 reps;30 seconds    Other Neck Stretches rhomboid stretch 2 x 30 crossing arms on door handle and leaning backward            Trigger Point Dry Needling - 05/30/20 0001    Consent Given? Yes    Education Handout Provided Previously provided    Muscles Treated Head and Neck Levator scapulae    Muscles Treated Upper Quadrant Rhomboids    Upper Trapezius Response Twitch reponse elicited;Palpable increased muscle length   bil   Levator Scapulae Response Twitch response elicited;Palpable increased muscle length   bil   Rhomboids Response Twitch response elicited;Palpable increased muscle length   R only                    PT Long Term Goals - 05/16/20 1028      PT LONG TERM GOAL #1   Title Pt will report the incidence and level of her arm pain is decreased by 50%    Baseline ongoing    Time 4    Period Weeks    Status On-going    Target  Date 06/13/20      PT LONG TERM GOAL #2   Title Pt will be independent in  a home exericse program for postural stretching for work stretch breaks so that she can do her job easier    Baseline updates today, updates ongoing    Time 4    Period Weeks    Status On-going    Target Date 06/13/20      PT LONG TERM GOAL #3   Title Pt will know how to get replacement compression garments so that she can continue to manage her lymphedema at home    Time 4    Period Weeks    Status On-going    Target Date 06/13/20      PT LONG TERM GOAL #4   Title Increase bilat. cervical rotation AROM to at least 50 deg to improve ability to turn head while driving    Baseline see objective    Time 4    Period Weeks    Status New    Target Date 06/13/20                 Plan - 05/30/20 1137    Clinical Impression Statement Continued TPDN focusing on bil upper trap/ levator scapuale and R rhomoboids followed with IASTM techniques and thoracic/ rib mobs. trialed inhibition taping for bil upper trap which pt noted relief of tension/ soreness, and did well with thoracic extension.    PT Treatment/Interventions ADLs/Self Care Home Management;Therapeutic activities;Therapeutic exercise;Neuromuscular re-education;Patient/family education;Dry needling;Manual techniques;Passive range of motion;Scar mobilization    PT Next Visit Plan dry needling, soft tissue work, exercise and stretching, how was inhibition taping, thoracic monbility           Patient will benefit from skilled therapeutic intervention in order to improve the following deficits and impairments:  Pain, Postural dysfunction, Impaired UE functional use, Increased fascial restricitons, Decreased strength, Decreased range of motion, Increased edema, Impaired perceived functional ability, Obesity, Increased muscle spasms  Visit Diagnosis: Disorder of the skin and subcutaneous tissue related to radiation, unspecified  Pain in left  arm  Stiffness of right shoulder, not elsewhere classified  Stiffness of left shoulder, not elsewhere classified  Chronic right shoulder pain  Cervicalgia     Problem List Patient Active Problem List   Diagnosis Date Noted  . Hypertension 09/01/2018  . Screening for colorectal cancer 09/01/2018  . Encounter for gynecological examination with Papanicolaou smear of cervix 09/01/2018  . 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 Genetic testing 01/12/2018  . Family history of breast cancer   . Port-A-Cath in place 11/21/2017  . Mass of axillary tail of right breast 08/24/2017  . Pain with urination 08/24/2017  . Urinary frequency 08/24/2017  . Hematuria 08/24/2017  . Encounter for well woman exam with routine gynecological exam 01/15/2016  . Benign cyst of right breast 11/29/2015  . Positive fecal occult blood test 07/22/2014  . History of breast cancer 07/22/2014  . Rectal bleeding 07/10/2014  . Lymphedema 06/15/2012  . BRCA1 negative 10/22/2011  . BRCA2 negative 10/22/2011  . Cancer of left breast ([place]) 06/30/2011    Class: Diagnosis of   Daylynn Stumpp PT, DPT, LAT, ATC  05/30/20  11:44 AM      [facility] [address] [place], [place], [address] Phone: [phone]   Fax:  [phone]  Name: Venba Zenner MRN: [id number] Date of Birth: 05/19/1971

## 2020-06-06 ENCOUNTER — Ambulatory Visit: Payer: Medicare Other | Admitting: Physical Therapy

## 2020-06-13 ENCOUNTER — Ambulatory Visit: Payer: Medicare Other | Admitting: Physical Therapy

## 2020-06-20 ENCOUNTER — Ambulatory Visit: Payer: Medicare Other | Admitting: Physical Therapy

## 2020-06-20 ENCOUNTER — Encounter: Payer: Self-pay | Admitting: Physical Therapy

## 2020-06-20 ENCOUNTER — Other Ambulatory Visit: Payer: Self-pay

## 2020-06-20 DIAGNOSIS — L599 Disorder of the skin and subcutaneous tissue related to radiation, unspecified: Secondary | ICD-10-CM

## 2020-06-20 DIAGNOSIS — M79602 Pain in left arm: Secondary | ICD-10-CM

## 2020-06-20 DIAGNOSIS — M25612 Stiffness of left shoulder, not elsewhere classified: Secondary | ICD-10-CM

## 2020-06-20 DIAGNOSIS — M542 Cervicalgia: Secondary | ICD-10-CM

## 2020-06-20 DIAGNOSIS — G8929 Other chronic pain: Secondary | ICD-10-CM

## 2020-06-20 DIAGNOSIS — M25611 Stiffness of right shoulder, not elsewhere classified: Secondary | ICD-10-CM

## 2020-06-20 DIAGNOSIS — M25511 Pain in right shoulder: Secondary | ICD-10-CM

## 2020-06-20 NOTE — Therapy (Signed)
[place], [place], [address] Phone: [phone]   Fax:  [phone]  Physical Therapy Treatment/Recertification  Patient Details  Name: Jessica Simpson MRN: [id number] Date of Birth: Jan 15, 1971 Referring Provider (PT): Dr Lindi Adie    Encounter Date: 06/20/2020   PT End of Session - 06/20/20 1116    Visit Number 5    Number of Visits 13    Date for PT Re-Evaluation 07/18/20    Authorization Type [employer]    PT Start Time 1017    PT Stop Time 1101   33 min direct tx. time (time spent dry needling not included)   PT Time Calculation (min) 44 min    Activity Tolerance Patient tolerated treatment well    Behavior During Therapy [facility] for tasks assessed/performed           Past Medical History:  Diagnosis Date  . BRCA1 negative 10/22/2011  . BRCA2 negative 10/22/2011  . Breast cancer, IDC, Left, Stage III, Triple negative 06/30/2011  . Breast disorder   . Contraceptive education 01/15/2016  . Family history of breast cancer   . Fracture    right 4th toe  . History of breast cancer 07/22/2014  . History of radiation therapy 05/16/18- 06/22/18   Right Breast/ 50.4 Gy in 28 fractions. Right posterior axilla and SCV nodes/ 50.4 Gy in 28 fractions.   . Hypertension   . Lymphedema 06/15/2012  . Lymphedema of arm   . Personal history of chemotherapy   . Personal history of radiation therapy   . Positive fecal occult blood test 07/22/2014  . S/P radiation therapy 2013   50 gray in 25 fractions to the left breast, supraclavicular, and axillary regions. She then received a boost to the left lumpectomy of 10 gray in 5 fractions. This was given at [place].  . Status post chemotherapy Comp. 11/23/11   FEC and Taxotere    Past Surgical History:  Procedure Laterality Date  . 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 AXILLARY LYMPH NODE BIOPSY Right  04/06/2018  . AXILLARY LYMPH NODE DISSECTION Right 04/06/2018   Procedure: RIGHT AXILLARY LYMPH NODE DISSECTION;  Surgeon: Rolm Bookbinder, MD;  Location: [place];  Service: General;  Laterality: Right;  . BREAST LUMPECTOMY Left 2012  . BREAST LUMPECTOMY Right 2019   rt axilla  . BREAST SURGERY    . CESAREAN SECTION    . COLONOSCOPY N/A 08/14/2014   Procedure: COLONOSCOPY;  Surgeon: Rogene Houston, MD;  [facility];  Service: Endoscopy;  Laterality: N/A;  930  . EVACUATION BREAST HEMATOMA  12/21/2011   Procedure: EVACUATION HEMATOMA BREAST;  Surgeon: Stark Klein, MD;  Location: [place];  Service: General;  Laterality: Left;  . HAND SURGERY     tumor on finger, left hand  . INCISION AND DRAINAGE ABSCESS ANAL     x3  . left breast biopsy with axillary biopsy     core bx  . PORT-A-CATH REMOVAL  12/21/2011   Procedure: REMOVAL PORT-A-CATH;  Surgeon: Haywood Lasso, MD;  Location: [place];  Service: General;  Laterality: N/A;  . PORTACATH PLACEMENT  08/02/2011   dr Margot Chimes  . PORTACATH PLACEMENT Right 11/14/2017   Procedure: INSERTION PORT-A-CATH WITH [place];  Surgeon: Rolm Bookbinder, MD;  Location: [place];  Service: General;  Laterality: Right;  . removal breast mass  2004   benign  .  TREATMENT FISTULA ANAL    . TUMOR REMOVAL     on left hand  . TUMOR REMOVAL     rt. eye    There were no vitals filed for this visit.   Subjective Assessment - 06/20/20 1110    Subjective Pt. returns, not seen for the past 3 weeks for therapy-had to cancel last visit due to helping daughter move into college housing for the semester. Her primary complaint is bilateral upper trapezius region pain and tightness with pain 3/10 this AM with associated postural difficulty.    Pertinent History Breast cancer Lt treated in 2012-2013 with chemotherapy/taxotere post lumpectomy and SLNB 3/7 nodes and radiation with onset of lymphedema during radiation, treatment [facility] l[facility]hed[facility] at [facility] in 2013.  Reoccurence in Rt axillary lymph node with chemotherapy ongoing carboplatin and gemcitabine x 6 cycles and ALND 1/11 positive, radiation completed 06/19/18 on the Rt.  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 Yes    Pain Score 3     Pain Location Back    Pain Orientation Right;Left;Upper    Pain Descriptors / Indicators Dull    Pain Type Chronic pain    Pain Radiating Towards bilateral uppet trapezius region    Pain Onset More than a month ago    Pain Frequency Intermittent    Aggravating Factors  activity              [facility] PT Assessment - 06/20/20 0001      AROM   Right Shoulder Flexion 145 Degrees    Right Shoulder ABduction 95 Degrees    Right Shoulder External Rotation 60 Degrees    Left Shoulder Flexion 145 Degrees    Left Shoulder ABduction 100 Degrees    Left Shoulder External Rotation 60 Degrees    Cervical Flexion 38    Cervical Extension 20    Cervical - Right Side Bend 32    Cervical - Left Side Bend 32    Cervical - Right Rotation 48    Cervical - Left Rotation 62      Strength   Right Shoulder Flexion 5/5    Right Shoulder ABduction 4+/5    Right Shoulder Internal Rotation 5/5    Right Shoulder External Rotation 4+/5    Left Shoulder Flexion 5/5    Left Shoulder ABduction 4+/5    Left Shoulder Internal Rotation 5/5    Left Shoulder External Rotation 4+/5                   [facility]patient [facility]  Lymphedema Life Impact Scale Total Score 20.59 %            [facility] Adult PT Treatment/Exercise - 06/20/20 0001      Shoulder Exercises: Standing   Other Standing Exercises Instructed/reviewed updated HEP-added Theraband rows and extension in standing as well as supine horizontal abduction, also updated HEP with/practiced  cervical retractions and cervical rotation ROM assisted with towel      Manual Therapy   Joint Mobilization thoracic PAs grade I-III    Soft tissue mobilization bilateral upper trapezius in prone and sitting      Neck Exercises: Stretches   Upper Trapezius Stretch --   brief review HEP stretches  Trigger Point Dry Needling - 06/20/20 0001    Consent Given? Yes    Education Handout Provided Previously provided    Muscles Treated Head and Neck Upper trapezius;Levator scapulae    Dry Needling Comments needling to bilateral sides in sidelying + pronewith 30 gauge 30 mm needles                PT Education - 06/20/20 1115    Education Details HEP updates, POC, Theracane use    Person(s) Educated Patient    Methods Explanation;Demonstration;Verbal cues;Handout    Comprehension Verbalized understanding               PT Long Term Goals - 06/20/20 1119      PT LONG TERM GOAL #1   Title Pt will report the incidence and level of her arm pain is decreased by 50%    Baseline improving, current symptoms more in upper trapezius region bilaterally    Time 4    Period Weeks    Status On-going    Target Date 07/18/20      PT LONG TERM GOAL #2   Title Pt will be independent in a home exericse program for postural stretching for work stretch breaks so that she can do her job easier    Baseline updates today, updates ongoing    Time 4    Period Weeks    Status On-going    Target Date 07/18/20      PT LONG TERM GOAL #3   Title Pt will know how to get replacement compression garments so that she can continue to manage her lymphedema at home    Time 4    Period Weeks    Status On-going    Target Date 07/18/20      PT LONG TERM GOAL #4   Title Increase bilat. cervical rotation AROM to at least 50 deg to improve ability to turn head while driving    Baseline see objective, met for left but not for right side    Time 4    Period Weeks    Status Partially Met     Target Date 07/18/20      PT LONG TERM GOAL #5   Title Increase bilateral shoulder strength to grossly 5/5 to improve ability for lifting activities for chores    Baseline abd and ER both 4+/5    Time 4    Period Weeks    Status New    Target Date 07/18/20                 Plan - 06/20/20 1026    Clinical Impression Statement Pt. presents for 5th therapy visit today/limited attendance since last recertification due to some scheduling difficulties and having to cancel appointment last week. Improving from baseline but still with shoulder/postural weakness and muscle tightness limiting cervical rotation AROM on right>left side. Plan continue PT for further progress to address remaining functional limitations for positional and activity tolerance.    Personal Factors and Comorbidities Comorbidity 3+    Comorbidities breast cancer in left breast with recurrence in right axillary nodeswith surgery chemo  and radiation, lymphedema , chemotherapy induced peripheral neuropathy    Examination-Activity Limitations Reach Overhead;Carry;Other    Stability/Clinical Decision Making Stable/Uncomplicated    Clinical Decision Making Low    Rehab Potential Fair    PT Frequency 2x / week    PT Duration 4 weeks    PT Treatment/Interventions ADLs/Self Care Home Management;Therapeutic activities;Therapeutic exercise;Neuromuscular re-education;Patient/family  education;Dry needling;Manual techniques;Passive range of motion;Scar mobilization    PT Next Visit Plan dry needling, soft tissue work, exercise and stretching, how was inhibition taping, thoracic monbility    PT Home Exercise Plan [phone]    Consulted and Agree with Plan of Care Patient           Patient will benefit from skilled therapeutic intervention in order to improve the following deficits and impairments:  Pain, Postural dysfunction, Impaired UE functional use, Increased fascial restricitons, Decreased strength, Decreased range of  motion, Increased edema, Impaired perceived functional ability, Obesity, Increased muscle spasms  Visit Diagnosis: Disorder of the skin and subcutaneous tissue related to radiation, unspecified  Pain in left arm  Stiffness of right shoulder, not elsewhere classified  Stiffness of left shoulder, not elsewhere classified  Chronic right shoulder pain  Cervicalgia     Problem List Patient Active Problem List   Diagnosis Date Noted  . Hypertension 09/01/2018  . Screening for colorectal cancer 09/01/2018  . Encounter for gynecological examination with Papanicolaou smear of cervix 09/01/2018  . 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 Genetic testing 01/12/2018  . Family history of breast cancer   . Port-A-Cath in place 11/21/2017  . Mass of axillary tail of right breast 08/24/2017  . Pain with urination 08/24/2017  . Urinary frequency 08/24/2017  . Hematuria 08/24/2017  . Encounter for well woman exam with routine gynecological exam 01/15/2016  . Benign cyst of right breast 11/29/2015  . Positive fecal occult blood test 07/22/2014  . History of breast cancer 07/22/2014  . Rectal bleeding 07/10/2014  . Lymphedema 06/15/2012  . BRCA1 negative 10/22/2011  . BRCA2 negative 10/22/2011  . Cancer of left breast ([place]) 06/30/2011    Class: Diagnosis of    Beaulah Dinning, PT, DPT 06/20/20 11:24 AM  [facility] [address] [place], [place], [address] Phone: [phone]   Fax:  [phone]  Name: Alaura Schippers MRN: [id number] Date of Birth: [birth year]

## 2020-07-10 ENCOUNTER — Ambulatory Visit: Payer: BC Managed Care – PPO | Admitting: Physical Therapy

## 2020-08-25 ENCOUNTER — Other Ambulatory Visit: Payer: Self-pay | Admitting: Hematology and Oncology

## 2020-08-25 DIAGNOSIS — Z9889 Other specified postprocedural states: Secondary | ICD-10-CM

## 2020-10-07 ENCOUNTER — Ambulatory Visit
Admission: RE | Admit: 2020-10-07 | Discharge: 2020-10-07 | Disposition: A | Discharge status: Home / Self Care | Payer: Medicare Other | Source: Ambulatory Visit | Attending: Hematology and Oncology | Admitting: Hematology and Oncology

## 2020-10-07 ENCOUNTER — Other Ambulatory Visit: Payer: Self-pay

## 2020-10-07 DIAGNOSIS — Z9889 Other specified postprocedural states: Secondary | ICD-10-CM

## 2020-10-28 ENCOUNTER — Telehealth: Payer: Self-pay | Admitting: *Deleted

## 2020-10-28 NOTE — Telephone Encounter (Signed)
Patient has questions regarding [employer] disability form per collaborative nurse.  No new form.  [employer] continues to request medical records only.  Connected with [employer] representative, Jan.  Confirmed fax number [phone] to send request per [facility] ([place]) H.I.M for a "clearer copy of [place] authorization and updated dates of signature" to process.    [employer] asked "status of request faxed to [phone] on 10/09/2020.  We need records from April 24, 2020 through present.  Currently need current treatment plan, next office visit and return to work date."    All record request are forwarded to (SW) [facility] H.I.M.  Provided phone: [phone], fax [phone].    Forms coordinating nurse fax: [phone] also provided.    Requested status of [employer]'s request of ([place]) H.I.M. unobtainable.  "Must speak with patient only or provide information to third parties upon receipt of H.I.P.A.A. approved signed release form.         Shelby Mattocks provided above information along with request for "information to/through present" ends on date of signed release.  Release was signed 02/11/2020  Shelby Mattocks reports "signed a new ROI a few weeks ago".

## 2020-11-18 ENCOUNTER — Telehealth: Payer: Self-pay | Admitting: *Deleted

## 2020-11-18 NOTE — Telephone Encounter (Signed)
Connected with Lonzo Cloud with [employer].  Asked why [employer] faxed to this nurse fax [phone]) the four page facsimile letter, tips for requesting medical records and blank unsigned [facility] Authorization forms.  Letter reads from "[name] H.I.M. Dept., [address], [place], [address] sent to [place] dated 11/06/2020.  Per Lonzo Cloud; "[employer] faxed this as a request for records and notes.  A signed release is on file."   [facility] H.I.M. staff sent request to [employer] reads "Unable to release records past the patient's date of signature.    "I will update the claims administrator"  Per Lonzo Cloud.

## 2020-11-23 NOTE — Progress Notes (Addendum)
Patient Care Team: Patient, [name] as PCP - General ([place]) Neldon Mc, MD as Surgeon (General Surgery) Everardo All, MD (Hematology and Oncology)  DIAGNOSIS:    ICD-10-CM   1. 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, estrogen receptor negative ([place])  C50.611    Z17.1     SUMMARY OF ONCOLOGIC HISTORY: Oncology History  Cancer of left breast ([place])  06/30/2011 Initial Diagnosis   Breast cancer, IDC, Left, Stage III, Triple negative, 7.6 cm breast mass and palpable axillary mass   09/06/2011 Miscellaneous   BRCA 1 and 2: Negative   09/14/2011 - 11/23/2011 Neo-Adjuvant Chemotherapy   Dose dense FEC followed by dose dense Taxotere   12/21/2011 Surgery   Left lumpectomy: High-grade poorly differentiated IDC 1.7 cm with high-grade DCIS, margins negative, 3/7 lymph nodes positive, ER 0%, PR 0%, HER-2 negative ratio 1.33 T1CN1 stage IIb   12/31/2011 - 02/15/2012 Radiation Therapy   Radiation at [facility]   09/26/2017 Relapse/Recurrence   Left breast upper outer quadrant within the lumpectomy bed: Fibrosis no malignancy, right axillary lymph node biopsy metastatic high-grade carcinoma ER 0%, PR 0%, Ki-67 90%, HER-2 negative ratio 1.11 (similar to previous ductal carcinoma)   11/14/2017 - 03/06/2018 Neo-Adjuvant Chemotherapy   Neo-Adjuvant chemotherapy with Gemzar and carboplatin days 1 and 8 every 3 weeks   01/10/2018 Genetic Testing   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  The Hereditary Gene Panel offered by [facility] includes sequencing and/or deletion duplication testing of the following 47 genes: APC, ATM, AXIN2, BARD1, BMPR1A, BRCA1, BRCA2, BRIP1, CDH1, CDK4, CDKN2A (p14ARF), CDKN2A (p16INK4a), CHEK2, CTNNA1, DICER1, EPCAM (Deletion/duplication testing only), GREM1 (promoter region deletion/duplication testing only), KIT, MEN1, MLH1, MSH2, MSH3, MSH6, MUTYH, NBN, NF1, NHTL1, PALB2, PDGFRA, PMS2, POLD1, POLE,  PTEN, RAD50, RAD51C, RAD51D, SDHB, SDHC, SDHD, SMAD4, SMARCA4. STK11, TP53, TSC1, TSC2, and VHL.  The following genes were evaluated for sequence changes only: SDHA and HOXB13 c.251G>A variant only. The report date is January 10, 2018.    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 ([place])  04/06/2018 Surgery   Right axillary lymph node dissection: 1/11 node positive for metastatic high-grade carcinoma   04/25/2018 Initial Diagnosis   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 ([place])   04/25/2018 Cancer Staging   Staging form: Breast, AJCC 8th Edition - Clinical: Stage IIB (cT0, cN1, cM0, G3, ER-, PR-, HER2-) - Signed by Eppie Gibson, MD on 04/25/2018   05/16/2018 - 06/22/2018 Radiation Therapy   Adjuvant radiation therapy with Xeloda   07/11/2018 - 10/24/2018 Chemotherapy   Adjuvant chemotherapy with CMF     CHIEF COMPLIANT: Follow-up of triple negative right breast cancer  INTERVAL HISTORY: Jessica Simpson is a 50 y.o. with above-mentioned history of triple negative left breast cancer in 2012, followed by a recurrence of the cancer in the axillary tail of the right breast for which she underwent a right axillary lymph node dissection, radiation, and adjuvant chemotherapy.Mammogram on 10/07/20 showed no evidence of malignancy bilaterally. She presents to the clinicalonetodayfor follow-up. One of her major issues today is upper extremity lymphedema as a result of prior breast cancer surgeries and radiation.  It is making it very difficult for her to function at home as well as inability to work.  She also has decreased strength in the upper extremities.  ALLERGIES:  is allergic to codeine and lortab [hydrocodone-acetaminophen].  MEDICATIONS:  Current Outpatient Medications  Medication Sig Dispense Refill  .  ibuprofen (ADVIL) 800 MG tablet Take 1 tablet (800 mg total) by mouth every 8 (eight) hours as needed. 30 tablet 0  . acetaminophen (TYLENOL) 500 MG tablet Take 500 mg by  mouth every 6 (six) hours as needed. For pain     . amLODipine (NORVASC) 5 MG tablet Take 5 mg by mouth daily.  6  . Cholecalciferol 5000 units capsule Take 1 capsule (5,000 Units total) by mouth daily.    . vitamin B-12 (CYANOCOBALAMIN) 500 MCG tablet Take 500 mcg by mouth daily.     No current facility-administered medications for this visit.   Facility-Administered Medications Ordered in Other Visits  Medication Dose Route Frequency Provider Last Rate Last Admin  . lactated ringers infusion    Continuous PRN Kerby Less, CRNA   New Bag at 11/14/17 0700    PHYSICAL EXAMINATION: ECOG PERFORMANCE STATUS: 1 - Symptomatic but completely ambulatory  Vitals:   11/24/20 1537  BP: (!) 217/93  Pulse: 74  Resp: 18  Temp: 97.6 F (36.4 C)  SpO2: 100%   Filed Weights   11/24/20 1537  Weight: 234 lb 1.6 oz (106.2 kg)    BREAST: No palpable masses or nodules in either right or left breasts. No palpable axillary supraclavicular or infraclavicular adenopathy no breast tenderness or nipple discharge. (exam performed in the presence of a chaperone)  Upper extremity lymphedema causing weakness  LABORATORY DATA:  I have reviewed the data as listed CMP Latest Ref Rng & Units 12/21/2018 10/24/2018 10/03/2018  Glucose 70 - 99 mg/dL 78 92 131(H)  BUN 6 - 20 mg/dL _0 Creatinine 0.44 - 1.00 mg/dL 0.89 0.82 0.98  Sodium 135 - 145 mmol/L 140 140 141  Potassium 3.5 - 5.1 mmol/L 3.6 4.0 3.5  Chloride 98 - 111 mmol/L 104 104 106  CO2 22 - 32 mmol/L _1 Calcium 8.9 - 10.3 mg/dL 9.5 9.5 9.6  Total Protein 6.5 - 8.1 g/dL 7.7 7.3 7.3  Total Bilirubin 0.3 - 1.2 mg/dL 0.4 0.3 0.3  Alkaline Phos 38 - 126 U/L 128(H) 119 109  AST 15 - 41 U/L 15 14(L) 17  ALT 0 - 44 U/L _2 Lab Results  Component Value Date   WBC 5.6 12/21/2018   HGB 12.0 12/21/2018   HCT 37.6 12/21/2018   MCV 81.0 12/21/2018   PLT 266 12/21/2018   NEUTROABS 3.7 12/21/2018    ASSESSMENT & PLAN:   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 ([place]) Prior left breast cancer 2012: Treated with neoadjuvant FEC-Taxotere status post lumpectomy radiation. Recurrence: Right axillary lymph node. Neoadjuvant chemo carboplatin and gemcitabine x6 cycles Right axillary lymph node dissection 1/11 lymph nodes positive Adjuvant radiation with Xelodacompleted 06/19/18 Completed 6 cycles of CMF adjuvant chemo12/31/2019  Breast cancer surveillance: 1.Mammogram:  10/07/2020: Benign breast density category C 2.Breast exam  11/24/2020: Benign  I encouraged her to exercise and lose some weight.  Severe uncontrolled hypertension:  Still remains a problem.  She is completely asymptomatic.  She will contact her primary care physician.  Lymphedema of upper extremities: This is in spite of exercises and massages.  We had previously done physical therapy as well.  She is unable to grasp objects with full strength.  She is also having pain in the extremities making it difficult for her at home and inability to work.  We discussed measures to improve the lymphedema.  However she has been through physical therapy previously.  She is applying once again for disability based upon her lymphedema.  Fracture of the toes: After a fall in the hot tub: She is in a lot of pain.  I sent a prescription for ibuprofen.  She went to the urgent care yesterday and got x-rays done.  Scans to be done on an as-needed basis Return to clinic in 1 year for follow-up    No orders of the defined types were placed in this encounter.  The patient has a good understanding of the overall plan. she agrees with it. she will call with any problems that may develop before the next visit here.  Total time spent: 20 mins including face to face time and time spent for planning, charting and coordination of care  Nicholas Lose, MD 11/24/2020  I, Cloyde Reams Dorshimer, am acting as scribe for Dr. Nicholas Lose.  I have reviewed the above  documentation for accuracy and completeness, and I agree with the above.

## 2020-11-24 ENCOUNTER — Inpatient Hospital Stay: Payer: 59 | Attending: Hematology and Oncology | Admitting: Hematology and Oncology

## 2020-11-24 ENCOUNTER — Other Ambulatory Visit: Payer: Self-pay

## 2020-11-24 DIAGNOSIS — W16012A Fall into swimming pool striking water surface causing other injury, initial encounter: Secondary | ICD-10-CM | POA: Insufficient documentation

## 2020-11-24 DIAGNOSIS — S92919A Unspecified fracture of unspecified toe(s), initial encounter for closed fracture: Secondary | ICD-10-CM | POA: Diagnosis not present

## 2020-11-24 DIAGNOSIS — Z923 Personal history of irradiation: Secondary | ICD-10-CM | POA: Insufficient documentation

## 2020-11-24 DIAGNOSIS — C50611 Malignant neoplasm of axillary tail of right female breast: Secondary | ICD-10-CM | POA: Diagnosis present

## 2020-11-24 DIAGNOSIS — Y9389 Activity, other specified: Secondary | ICD-10-CM | POA: Insufficient documentation

## 2020-11-24 DIAGNOSIS — Y929 Unspecified place or not applicable: Secondary | ICD-10-CM | POA: Insufficient documentation

## 2020-11-24 DIAGNOSIS — I10 Essential (primary) hypertension: Secondary | ICD-10-CM | POA: Insufficient documentation

## 2020-11-24 DIAGNOSIS — I890 Lymphedema, not elsewhere classified: Secondary | ICD-10-CM | POA: Diagnosis not present

## 2020-11-24 DIAGNOSIS — Z171 Estrogen receptor negative status [ER-]: Secondary | ICD-10-CM | POA: Insufficient documentation

## 2020-11-24 MED ORDER — IBUPROFEN 800 MG PO TABS: 800.0000 mg | ORAL_TABLET | Freq: Three times a day (TID) | ORAL | 0 refills | Status: DC | PRN

## 2020-11-24 NOTE — Assessment & Plan Note (Signed)
Prior left breast cancer 2012: Treated with neoadjuvant FEC-Taxotere status post lumpectomy radiation. Recurrence: Right axillary lymph node. Neoadjuvant chemo carboplatin and gemcitabine x6 cycles Right axillary lymph node dissection 1/11 lymph nodes positive Adjuvant radiation with Xelodacompleted 06/19/18 Completed 6 cycles of CMF adjuvant chemo12/31/2019  Breast cancer surveillance: 1.Mammogram:  10/07/2020: Benign breast density category C 2.Breast exam  11/24/2020: Benign   Lymphedema bilateral arms: Better but she continues to have pain in the arm.  She is trying dry needling Severe uncontrolled hypertension: Much improved with adjustment of her blood pressure medications  Scans to be done on an as-needed basis Return to clinic in 1 year for follow-up

## 2020-11-25 ENCOUNTER — Encounter: Payer: Self-pay | Admitting: *Deleted

## 2020-11-25 ENCOUNTER — Telehealth: Payer: Self-pay | Admitting: Hematology and Oncology

## 2020-11-25 NOTE — Telephone Encounter (Signed)
Scheduled per 1/31 los. Pt will receive an updated appt calendar per next visit appt notes  

## 2020-11-25 NOTE — Progress Notes (Signed)
Received fax from nurse consultant with pt insurance company stating disability coverage has been denied.  Pt met with MD yesterday for assessment and RN successfully faxed office note to pt insurance for further review of disability coverage.

## 2020-12-19 ENCOUNTER — Other Ambulatory Visit (HOSPITAL_COMMUNITY)
Admission: RE | Admit: 2020-12-19 | Discharge: 2020-12-19 | Disposition: A | Discharge status: Home / Self Care | Payer: 59 | Source: Ambulatory Visit | Attending: Adult Health | Admitting: Adult Health

## 2020-12-19 ENCOUNTER — Encounter: Payer: Self-pay | Admitting: Adult Health

## 2020-12-19 ENCOUNTER — Ambulatory Visit (INDEPENDENT_AMBULATORY_CARE_PROVIDER_SITE_OTHER): Payer: 59 | Admitting: Adult Health

## 2020-12-19 ENCOUNTER — Other Ambulatory Visit: Payer: Self-pay

## 2020-12-19 VITALS — BP 194/119 | HR 76 | Ht 66.0 in | Wt 235.0 lb

## 2020-12-19 DIAGNOSIS — Z01419 Encounter for gynecological examination (general) (routine) without abnormal findings: Secondary | ICD-10-CM

## 2020-12-19 DIAGNOSIS — Z0000 Encounter for general adult medical examination without abnormal findings: Secondary | ICD-10-CM | POA: Diagnosis not present

## 2020-12-19 DIAGNOSIS — I10 Essential (primary) hypertension: Secondary | ICD-10-CM

## 2020-12-19 DIAGNOSIS — Z1151 Encounter for screening for human papillomavirus (HPV): Secondary | ICD-10-CM | POA: Diagnosis not present

## 2020-12-19 DIAGNOSIS — Z853 Personal history of malignant neoplasm of breast: Secondary | ICD-10-CM

## 2020-12-19 DIAGNOSIS — Z1211 Encounter for screening for malignant neoplasm of colon: Secondary | ICD-10-CM | POA: Diagnosis not present

## 2020-12-19 DIAGNOSIS — R87810 Cervical high risk human papillomavirus (HPV) DNA test positive: Secondary | ICD-10-CM | POA: Diagnosis not present

## 2020-12-19 LAB — HEMOCCULT GUIAC POC 1CARD (OFFICE): Fecal Occult Blood, POC: NEGATIVE

## 2020-12-19 NOTE — Progress Notes (Signed)
Patient ID: Jessica Simpson, female   DOB: 02/20/1971, 50 y.o.   MRN: [id number] History of Present Illness:  Jessica Simpson is a 50 year old black female, single, G1P1 in for well woman gyn exam and pap. She has a peliton bike but it hurts, told her to get bigger seat.  PCP is in [place].   Current Medications, Allergies, Past Medical History, Past Surgical History, Family History and Social History were reviewed in [employer] record.     Review of Systems:  Patient denies any headaches, hearing loss, fatigue, blurred vision, shortness of breath, chest pain, abdominal pain, problems with bowel movements, urination, or intercourse. No joint pain or mood swings. Has catch in her sides at times   Physical Exam:BP (!) 194/119 (BP Location: Other (Comment), Patient Position: Sitting, Cuff Size: Large) Comment (BP Location): left ankle  Pulse 76   Ht 5\' 6"  (1.676 m)   Wt 235 lb (106.6 kg)   BMI 37.93 kg/m  she says BP fine at home  General:  Well developed, well nourished, no acute distress Skin:  Warm and dry Neck:  Midline trachea, normal thyroid, good ROM, no lymphadenopathy Lungs; Clear to auscultation bilaterally Breast:  No dominant palpable mass, retraction, or nipple discharge Cardiovascular: Regular rate and rhythm Abdomen:  Soft, non tender, no hepatosplenomegaly Pelvic:  External genitalia is normal in appearance, no lesions.  The vagina is normal in appearance. Urethra has no lesions or masses. The cervix is bulbous.Pap with HR HPV genotyping performed.  Uterus is felt to be normal size, shape, and contour.  No adnexal masses or tenderness noted.Bladder is non tender, no masses felt. Rectal: Good sphincter tone, no polyps, or hemorrhoids felt.  Hemoccult negative. Extremities/musculoskeletal:  No swelling or varicosities noted, no clubbing or cyanosis Psych:  No mood changes, alert and cooperative,seems happy AA is 0 Fall risk is high PHQ 9 score  is 2 GAD 7 score is 0  Upstream - 12/19/20 1100      Pregnancy Intention Screening   Does the patient want to become pregnant in the next year? No    Does the patient's partner want to become pregnant in the next year? No    Would the patient like to discuss contraceptive options today? No      Contraception Wrap Up   Current Method No Method - Other Reason    End Method No Method - Other Reason    Contraception Counseling Provided No         Examination chaperoned by Levy Pupa LPN  Impression and Plan; 1. Routine medical exam Pap sent  2. Encounter for gynecological examination with Papanicolaou smear of cervix Pap sent Physical in 1 year Pap in 3 if normal Mammogram yearly Labs with PCP Colonoscopy per GI  3. Encounter for screening fecal occult blood testing  4. History of breast cancer Mammogram yearly  5. Hypertension, unspecified type Keep check on BP at home and follow up with PCP

## 2020-12-25 ENCOUNTER — Encounter: Payer: Self-pay | Admitting: Adult Health

## 2020-12-25 DIAGNOSIS — R87810 Cervical high risk human papillomavirus (HPV) DNA test positive: Secondary | ICD-10-CM

## 2020-12-25 HISTORY — DX: Cervical high risk human papillomavirus (HPV) DNA test positive: R87.810

## 2020-12-25 LAB — CYTOLOGY - PAP
Adequacy: ABSENT
Comment: NEGATIVE
Comment: NEGATIVE
Diagnosis: NEGATIVE
HPV 16: NEGATIVE
HPV 18 / 45: NEGATIVE
High risk HPV: POSITIVE — AB

## 2021-03-25 ENCOUNTER — Telehealth: Payer: Self-pay | Admitting: Hematology and Oncology

## 2021-03-25 NOTE — Telephone Encounter (Signed)
R/s per prov pal , per 7/28 los, pt aware.

## 2021-05-06 NOTE — Progress Notes (Signed)
Patient Care Team: Patient, [name] (Inactive) as PCP - General ([place]) Neldon Mc, MD as Surgeon (General Surgery) Everardo All, MD (Hematology and Oncology)  DIAGNOSIS:    ICD-10-CM   1. 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, estrogen receptor negative ([place])  C50.611    Z17.1       SUMMARY OF ONCOLOGIC HISTORY: Oncology History  Cancer of left breast ([place])  06/30/2011 Initial Diagnosis   Breast cancer, IDC, Left, Stage III, Triple negative, 7.6 cm breast mass and palpable axillary mass   09/06/2011 Miscellaneous   BRCA 1 and 2: Negative    09/14/2011 - 11/23/2011 Neo-Adjuvant Chemotherapy   Dose dense FEC followed by dose dense Taxotere    12/21/2011 Surgery   Left lumpectomy: High-grade poorly differentiated IDC 1.7 cm with high-grade DCIS, margins negative, 3/7 lymph nodes positive, ER 0%, PR 0%, HER-2 negative ratio 1.33 T1CN1 stage IIb    12/31/2011 - 02/15/2012 Radiation Therapy   Radiation at [facility]    09/26/2017 Relapse/Recurrence   Left breast upper outer quadrant within the lumpectomy bed: Fibrosis no malignancy, right axillary lymph node biopsy metastatic high-grade carcinoma ER 0%, PR 0%, Ki-67 90%, HER-2 negative ratio 1.11 (similar to previous ductal carcinoma)    11/14/2017 - 03/06/2018 Neo-Adjuvant Chemotherapy   Neo-Adjuvant chemotherapy with Gemzar and carboplatin days 1 and 8 every 3 weeks   01/10/2018 Genetic Testing   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  The Hereditary Gene Panel offered by [facility] includes sequencing and/or deletion duplication testing of the following 47 genes: APC, ATM, AXIN2, BARD1, BMPR1A, BRCA1, BRCA2, BRIP1, CDH1, CDK4, CDKN2A (p14ARF), CDKN2A (p16INK4a), CHEK2, CTNNA1, DICER1, EPCAM (Deletion/duplication testing only), GREM1 (promoter region deletion/duplication testing only), KIT, MEN1, MLH1, MSH2, MSH3, MSH6, MUTYH, NBN, NF1, NHTL1, PALB2,  PDGFRA, PMS2, POLD1, POLE, PTEN, RAD50, RAD51C, RAD51D, SDHB, SDHC, SDHD, SMAD4, SMARCA4. STK11, TP53, TSC1, TSC2, and VHL.  The following genes were evaluated for sequence changes only: SDHA and HOXB13 c.251G>A variant only. The report date is January 10, 2018.     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 ([place])  04/06/2018 Surgery   Right axillary lymph node dissection: 1/11 node positive for metastatic high-grade carcinoma    04/25/2018 Initial Diagnosis   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 ([place])    04/25/2018 Cancer Staging   Staging form: Breast, AJCC 8th Edition - Clinical: Stage IIB (cT0, cN1, cM0, G3, ER-, PR-, HER2-) - Signed by Eppie Gibson, MD on 04/25/2018    05/16/2018 - 06/22/2018 Radiation Therapy   Adjuvant radiation therapy with Xeloda    07/11/2018 - 10/24/2018 Chemotherapy   Adjuvant chemotherapy with CMF      CHIEF COMPLIANT: Follow-up of triple negative right breast cancer   INTERVAL HISTORY: Jessica Simpson is a 50 y.o. with above-mentioned history of triple negative left breast cancer in 2012, followed by a recurrence of the cancer in the axillary tail of the right breast for which she underwent a right axillary lymph node dissection, radiation, and adjuvant chemotherapy. She presents to the clinic today for follow-up.  Her major complaint today is intermittent dizziness followed by fatigue.  She went to the emergency room in [place] and she was referred to cardiology.  ALLERGIES:  is allergic to codeine and lortab [hydrocodone-acetaminophen].  MEDICATIONS:  Current Outpatient Medications  Medication Sig Dispense Refill   acetaminophen (TYLENOL) 500 MG tablet Take 500 mg by mouth every 6 (six)  hours as needed. For pain     amLODipine (NORVASC) 5 MG tablet Take 5 mg by mouth daily.  6   Cholecalciferol 5000 units capsule Take 1 capsule (5,000 Units total) by mouth daily.     ibuprofen (ADVIL) 800 MG tablet Take 1 tablet (800 mg total) by  mouth every 8 (eight) hours as needed. 30 tablet 0   vitamin B-12 (CYANOCOBALAMIN) 500 MCG tablet Take 500 mcg by mouth daily.     No current facility-administered medications for this visit.   Facility-Administered Medications Ordered in Other Visits  Medication Dose Route Frequency Provider Last Rate Last Admin   lactated ringers infusion    Continuous PRN Kerby Less, CRNA   New Bag at 11/14/17 0700    PHYSICAL EXAMINATION: ECOG PERFORMANCE STATUS: 1 - Symptomatic but completely ambulatory  Vitals:   05/07/21 1133  BP: (!) 162/93  Pulse: 90  Resp: 18  Temp: 97.7 F (36.5 C)  SpO2: 100%   Filed Weights   05/07/21 1133  Weight: 237 lb 8 oz (107.7 kg)    BREAST: No palpable masses or nodules in either right or left breasts. No palpable axillary supraclavicular or infraclavicular adenopathy no breast tenderness or nipple discharge. (exam performed in the presence of a chaperone)  LABORATORY DATA:  I have reviewed the data as listed CMP Latest Ref Rng & Units 12/21/2018 10/24/2018 10/03/2018  Glucose 70 - 99 mg/dL 78 92 131(H)  BUN 6 - 20 mg/dL 10 10 12   Creatinine 0.44 - 1.00 mg/dL 0.89 0.82 0.98  Sodium 135 - 145 mmol/L 140 140 141  Potassium 3.5 - 5.1 mmol/L 3.6 4.0 3.5  Chloride 98 - 111 mmol/L 104 104 106  CO2 22 - 32 mmol/L 25 26 27   Calcium 8.9 - 10.3 mg/dL 9.5 9.5 9.6  Total Protein 6.5 - 8.1 g/dL 7.7 7.3 7.3  Total Bilirubin 0.3 - 1.2 mg/dL 0.4 0.3 0.3  Alkaline Phos 38 - 126 U/L 128(H) 119 109  AST 15 - 41 U/L 15 14(L) 17  ALT 0 - 44 U/L 14 14 20     Lab Results  Component Value Date   WBC 5.6 12/21/2018   HGB 12.0 12/21/2018   HCT 37.6 12/21/2018   MCV 81.0 12/21/2018   PLT 266 12/21/2018   NEUTROABS 3.7 12/21/2018    ASSESSMENT & PLAN:  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 ([place]) Prior left breast cancer 2012: Treated with neoadjuvant FEC-Taxotere status post lumpectomy radiation. Recurrence: Right axillary lymph node. Neoadjuvant  chemo carboplatin and gemcitabine x6 cycles Right axillary lymph node dissection 1/11 lymph nodes positive Adjuvant radiation with Xeloda completed 06/19/18 Completed 6 cycles of CMF adjuvant chemo 10/24/2018   Breast cancer surveillance: 1.  Mammogram:  10/07/2020: Benign breast density category C 2.  Breast exam 05/07/2021: Benign, scar tissue is palpable in the left breast   I encouraged her to exercise and lose some weight.  Severe uncontrolled hypertension: Much improved.  Episodes of dizziness followed by severe fatigue: She went to emergency room in [place].  She was referred to cardiology.  It is possible that she may be having intermittent atrial fibrillation.  Lymphedema of upper extremities: She is wearing glove and it is helping her.  Return to clinic in 1 year for follow-up    No orders of the defined types were placed in this encounter.  The patient has a good understanding of the overall plan. she agrees with it. she will call with any problems  that may develop before the next visit here.  Total time spent: 20 mins including face to face time and time spent for planning, charting and coordination of care  Rulon Eisenmenger, MD, MPH 05/07/2021  I, Thana Ates, am acting as scribe for Dr. Nicholas Lose.  I have reviewed the above documentation for accuracy and completeness, and I agree with the above.

## 2021-05-07 ENCOUNTER — Inpatient Hospital Stay: Payer: 59 | Attending: Hematology and Oncology | Admitting: Hematology and Oncology

## 2021-05-07 ENCOUNTER — Other Ambulatory Visit: Payer: Self-pay

## 2021-05-07 DIAGNOSIS — R42 Dizziness and giddiness: Secondary | ICD-10-CM | POA: Diagnosis not present

## 2021-05-07 DIAGNOSIS — Z923 Personal history of irradiation: Secondary | ICD-10-CM | POA: Diagnosis not present

## 2021-05-07 DIAGNOSIS — C50611 Malignant neoplasm of axillary tail of right female breast: Secondary | ICD-10-CM | POA: Insufficient documentation

## 2021-05-07 DIAGNOSIS — Z171 Estrogen receptor negative status [ER-]: Secondary | ICD-10-CM | POA: Diagnosis not present

## 2021-05-07 DIAGNOSIS — Z9221 Personal history of antineoplastic chemotherapy: Secondary | ICD-10-CM | POA: Insufficient documentation

## 2021-05-07 DIAGNOSIS — Z79899 Other long term (current) drug therapy: Secondary | ICD-10-CM | POA: Diagnosis not present

## 2021-05-07 DIAGNOSIS — R5383 Other fatigue: Secondary | ICD-10-CM | POA: Insufficient documentation

## 2021-05-07 DIAGNOSIS — I890 Lymphedema, not elsewhere classified: Secondary | ICD-10-CM | POA: Insufficient documentation

## 2021-05-07 NOTE — Assessment & Plan Note (Signed)
Prior left breast cancer 2012: Treated with neoadjuvant FEC-Taxotere status post lumpectomy radiation. Recurrence: Right axillary lymph node. Neoadjuvant chemo carboplatin and gemcitabine x6 cycles Right axillary lymph node dissection 1/11 lymph nodes positive Adjuvant radiation with Xelodacompleted 06/19/18 Completed 6 cycles of CMF adjuvant chemo12/31/2019  Breast cancer surveillance: 1.Mammogram: 10/07/2020: Benign breast density category C 2.Breast exam7/14/2022: Benign  I encouraged her to exercise and lose some weight.  Severe uncontrolled hypertension: Still remains a problem.  She is completely asymptomatic.  She will contact her primary care physician.  Lymphedema of upper extremities: This is in spite of exercises and massages.  Return to clinic in 1 year for follow-up

## 2021-05-21 ENCOUNTER — Ambulatory Visit: Payer: BC Managed Care – PPO | Admitting: Hematology and Oncology

## 2021-08-24 ENCOUNTER — Other Ambulatory Visit: Payer: Self-pay | Admitting: Hematology and Oncology

## 2021-08-24 DIAGNOSIS — Z9889 Other specified postprocedural states: Secondary | ICD-10-CM

## 2021-10-08 ENCOUNTER — Ambulatory Visit
Admission: RE | Admit: 2021-10-08 | Discharge: 2021-10-08 | Disposition: A | Discharge status: Home / Self Care | Payer: 59 | Source: Ambulatory Visit | Attending: Hematology and Oncology | Admitting: Hematology and Oncology

## 2021-10-08 DIAGNOSIS — Z9889 Other specified postprocedural states: Secondary | ICD-10-CM

## 2021-11-23 NOTE — Progress Notes (Signed)
Patient Care Team: Andres Shad, MD as PCP - General (Family Medicine) Neldon Mc, MD as Surgeon (General Surgery) Everardo All, MD (Hematology and Oncology)  DIAGNOSIS:    ICD-10-CM   1. 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, estrogen receptor negative ([place])  C50.611    Z17.1       SUMMARY OF ONCOLOGIC HISTORY: Oncology History  Cancer of left breast ([place])  06/30/2011 Initial Diagnosis   Breast cancer, IDC, Left, Stage III, Triple negative, 7.6 cm breast mass and palpable axillary mass   09/06/2011 Miscellaneous   BRCA 1 and 2: Negative   09/14/2011 - 11/23/2011 Neo-Adjuvant Chemotherapy   Dose dense FEC followed by dose dense Taxotere   12/21/2011 Surgery   Left lumpectomy: High-grade poorly differentiated IDC 1.7 cm with high-grade DCIS, margins negative, 3/7 lymph nodes positive, ER 0%, PR 0%, HER-2 negative ratio 1.33 T1CN1 stage IIb   12/31/2011 - 02/15/2012 Radiation Therapy   Radiation at [facility]   09/26/2017 Relapse/Recurrence   Left breast upper outer quadrant within the lumpectomy bed: Fibrosis no malignancy, right axillary lymph node biopsy metastatic high-grade carcinoma ER 0%, PR 0%, Ki-67 90%, HER-2 negative ratio 1.11 (similar to previous ductal carcinoma)   11/14/2017 - 03/06/2018 Neo-Adjuvant Chemotherapy   Neo-Adjuvant chemotherapy with Gemzar and carboplatin days 1 and 8 every 3 weeks   01/10/2018 Genetic Testing   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  The Hereditary Gene Panel offered by [facility] includes sequencing and/or deletion duplication testing of the following 47 genes: APC, ATM, AXIN2, BARD1, BMPR1A, BRCA1, BRCA2, BRIP1, CDH1, CDK4, CDKN2A (p14ARF), CDKN2A (p16INK4a), CHEK2, CTNNA1, DICER1, EPCAM (Deletion/duplication testing only), GREM1 (promoter region deletion/duplication testing only), KIT, MEN1, MLH1, MSH2, MSH3, MSH6, MUTYH, NBN, NF1, NHTL1, PALB2, PDGFRA, PMS2,  POLD1, POLE, PTEN, RAD50, RAD51C, RAD51D, SDHB, SDHC, SDHD, SMAD4, SMARCA4. STK11, TP53, TSC1, TSC2, and VHL.  The following genes were evaluated for sequence changes only: SDHA and HOXB13 c.251G>A variant only. The report date is January 10, 2018.    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 ([place])  04/06/2018 Surgery   Right axillary lymph node dissection: 1/11 node positive for metastatic high-grade carcinoma   04/25/2018 Initial Diagnosis   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 ([place])   04/25/2018 Cancer Staging   Staging form: Breast, AJCC 8th Edition - Clinical: Stage IIB (cT0, cN1, cM0, G3, ER-, PR-, HER2-) - Signed by Eppie Gibson, MD on 04/25/2018    05/16/2018 - 06/22/2018 Radiation Therapy   Adjuvant radiation therapy with Xeloda   07/11/2018 - 10/24/2018 Chemotherapy   Adjuvant chemotherapy with CMF     CHIEF COMPLIANT: Follow-up of triple negative right breast cancer   INTERVAL HISTORY: Jessica Simpson is a 51 y.o. with above-mentioned history of triple negative left breast cancer in 2012, followed by a recurrence of the cancer in the axillary tail of the right breast for which she underwent a right axillary lymph node dissection, radiation, and adjuvant chemotherapy. Mammogram on 10/08/2021 showed no evidence of malignancy. She presents to the clinic today for follow-up.  For the dizziness she has been undergoing multiple tests and procedures.  She is wearing a heart monitor currently.  Apparently she becomes very bradycardic.  There may be a need to do a pacemaker implantation.   ALLERGIES:  is allergic to codeine and lortab [hydrocodone-acetaminophen].  MEDICATIONS:  Current Outpatient Medications  Medication Sig Dispense Refill   acetaminophen (TYLENOL) 500 MG  tablet Take 500 mg by mouth every 6 (six) hours as needed. For pain     amLODipine (NORVASC) 5 MG tablet Take 5 mg by mouth daily.  6   Cholecalciferol 5000 units capsule Take 1 capsule (5,000  Units total) by mouth daily.     ibuprofen (ADVIL) 800 MG tablet Take 1 tablet (800 mg total) by mouth every 8 (eight) hours as needed. 30 tablet 0   vitamin B-12 (CYANOCOBALAMIN) 500 MCG tablet Take 500 mcg by mouth daily.     No current facility-administered medications for this visit.   Facility-Administered Medications Ordered in Other Visits  Medication Dose Route Frequency Provider Last Rate Last Admin   lactated ringers infusion    Continuous PRN Kerby Less, CRNA   New Bag at 11/14/17 0700    PHYSICAL EXAMINATION: ECOG PERFORMANCE STATUS: 1 - Symptomatic but completely ambulatory  There were no vitals filed for this visit. There were no vitals filed for this visit.  BREAST: No palpable masses or nodules in either right or left breasts. No palpable axillary supraclavicular or infraclavicular adenopathy no breast tenderness or nipple discharge. (exam performed in the presence of a chaperone)  LABORATORY DATA:  I have reviewed the data as listed CMP Latest Ref Rng & Units 12/21/2018 10/24/2018 10/03/2018  Glucose 70 - 99 mg/dL 78 92 131(H)  BUN 6 - 20 mg/dL 10 10 12   Creatinine 0.44 - 1.00 mg/dL 0.89 0.82 0.98  Sodium 135 - 145 mmol/L 140 140 141  Potassium 3.5 - 5.1 mmol/L 3.6 4.0 3.5  Chloride 98 - 111 mmol/L 104 104 106  CO2 22 - 32 mmol/L 25 26 27   Calcium 8.9 - 10.3 mg/dL 9.5 9.5 9.6  Total Protein 6.5 - 8.1 g/dL 7.7 7.3 7.3  Total Bilirubin 0.3 - 1.2 mg/dL 0.4 0.3 0.3  Alkaline Phos 38 - 126 U/L 128(H) 119 109  AST 15 - 41 U/L 15 14(L) 17  ALT 0 - 44 U/L 14 14 20     Lab Results  Component Value Date   WBC 5.6 12/21/2018   HGB 12.0 12/21/2018   HCT 37.6 12/21/2018   MCV 81.0 12/21/2018   PLT 266 12/21/2018   NEUTROABS 3.7 12/21/2018    ASSESSMENT & PLAN:  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 ([place]) Prior left breast cancer 2012: Treated with neoadjuvant FEC-Taxotere status post lumpectomy radiation. Recurrence: Right axillary lymph  node. Neoadjuvant chemo carboplatin and gemcitabine x6 cycles Right axillary lymph node dissection 1/11 lymph nodes positive Adjuvant radiation with Xeloda completed 06/19/18 Completed 6 cycles of CMF adjuvant chemo 10/24/2018   Breast cancer surveillance: 1.  Mammogram: 10/08/2021: Benign breast density category C 2.  Breast exam 11/24/2021: Benign, scar tissue is palpable in the left breast   Episodes of dizziness followed by severe fatigue: She is following with cardiology at [facility].  They have a cardiac monitor on her.  They may be discussing the role of pacemaker.   Lymphedema of upper extremities: She is wearing glove and it is helping her.   Return to clinic in 1 year for follow-up  No orders of the defined types were placed in this encounter.  The patient has a good understanding of the overall plan. she agrees with it. she will call with any problems that may develop before the next visit here.  Total time spent: 20 mins including face to face time and time spent for planning, charting and coordination of care  Rulon Eisenmenger, MD, MPH  11/24/2021  I, Thana Ates, am acting as scribe for Dr. Nicholas Lose.  I have reviewed the above documentation for accuracy and completeness, and I agree with the above.

## 2021-11-24 ENCOUNTER — Inpatient Hospital Stay: Payer: 59 | Attending: Hematology and Oncology | Admitting: Hematology and Oncology

## 2021-11-24 ENCOUNTER — Other Ambulatory Visit: Payer: Self-pay

## 2021-11-24 DIAGNOSIS — R5383 Other fatigue: Secondary | ICD-10-CM | POA: Insufficient documentation

## 2021-11-24 DIAGNOSIS — I890 Lymphedema, not elsewhere classified: Secondary | ICD-10-CM | POA: Diagnosis not present

## 2021-11-24 DIAGNOSIS — C50611 Malignant neoplasm of axillary tail of right female breast: Secondary | ICD-10-CM | POA: Insufficient documentation

## 2021-11-24 DIAGNOSIS — R42 Dizziness and giddiness: Secondary | ICD-10-CM | POA: Diagnosis not present

## 2021-11-24 DIAGNOSIS — Z171 Estrogen receptor negative status [ER-]: Secondary | ICD-10-CM | POA: Insufficient documentation

## 2021-11-24 NOTE — Assessment & Plan Note (Signed)
Prior left breast cancer 2012: Treated with neoadjuvant FEC-Taxotere status post lumpectomy radiation. Recurrence: Right axillary lymph node. Neoadjuvant chemo carboplatin and gemcitabine x6 cycles Right axillary lymph node dissection 1/11 lymph nodes positive Adjuvant radiation with Xelodacompleted 06/19/18 Completed 6 cycles of CMF adjuvant chemo12/31/2019  Breast cancer surveillance: 1.Mammogram: 10/08/2021: Benign breast density category C 2.Breast exam1/31/2023: Benign, scar tissue is palpable in the left breast  I encouraged her to exercise and lose some weight. Severe uncontrolled hypertension:Much improved.  Episodes of dizziness followed by severe fatigue: She went to emergency room in [place].  She was referred to cardiology.  It is possible that she may be having intermittent atrial fibrillation.  Lymphedema of upper extremities: She is wearing glove and it is helping her.  Return to clinic in 1 year for follow-up

## 2021-12-17 ENCOUNTER — Encounter: Payer: 59 | Admitting: Adult Health

## 2021-12-17 ENCOUNTER — Other Ambulatory Visit: Payer: Self-pay

## 2021-12-18 NOTE — Progress Notes (Signed)
This encounter was created in error - please disregard.

## 2021-12-31 ENCOUNTER — Other Ambulatory Visit: Payer: 59 | Admitting: Adult Health

## 2022-01-26 ENCOUNTER — Other Ambulatory Visit (HOSPITAL_COMMUNITY)
Admission: RE | Admit: 2022-01-26 | Discharge: 2022-01-26 | Disposition: A | Discharge status: Home / Self Care | Payer: 59 | Source: Ambulatory Visit | Attending: Adult Health | Admitting: Adult Health

## 2022-01-26 ENCOUNTER — Encounter: Payer: Self-pay | Admitting: Adult Health

## 2022-01-26 ENCOUNTER — Ambulatory Visit (INDEPENDENT_AMBULATORY_CARE_PROVIDER_SITE_OTHER): Payer: 59 | Admitting: Adult Health

## 2022-01-26 VITALS — BP 151/90 | HR 76 | Ht 66.0 in | Wt 224.0 lb

## 2022-01-26 DIAGNOSIS — Z853 Personal history of malignant neoplasm of breast: Secondary | ICD-10-CM

## 2022-01-26 DIAGNOSIS — Z01419 Encounter for gynecological examination (general) (routine) without abnormal findings: Secondary | ICD-10-CM

## 2022-01-26 DIAGNOSIS — Z1211 Encounter for screening for malignant neoplasm of colon: Secondary | ICD-10-CM | POA: Diagnosis not present

## 2022-01-26 DIAGNOSIS — N950 Postmenopausal bleeding: Secondary | ICD-10-CM | POA: Insufficient documentation

## 2022-01-26 DIAGNOSIS — Z1151 Encounter for screening for human papillomavirus (HPV): Secondary | ICD-10-CM | POA: Insufficient documentation

## 2022-01-26 DIAGNOSIS — R87810 Cervical high risk human papillomavirus (HPV) DNA test positive: Secondary | ICD-10-CM

## 2022-01-26 LAB — HEMOCCULT GUIAC POC 1CARD (OFFICE): Fecal Occult Blood, POC: NEGATIVE

## 2022-01-26 NOTE — Progress Notes (Signed)
Patient ID: Jessica Simpson, female   DOB: [birth year], 51 y.o.   MRN: [id number] ?History of Present Illness: ?Jessica Simpson is a 51 year old black female,single, G1P1 in for a well woman gyn exam and pap. Pap last year was +HPV. She had breast cancer and had surgery,chemo and radiation. No period in 3 years since  chemo and spotted 2 days recently and had some cramping.  ?Lab Results  ?Component Value Date  ? DIAGPAP  12/19/2020  ?  - Negative for intraepithelial lesion or malignancy (NILM)  ? HPV NOT DETECTED 09/01/2018  ? [place] Positive (A) 12/19/2020  ?  ?PCP is Dr Teryl Lucy. ? ?Current Medications, Allergies, Past Medical History, Past Surgical History, Family History and Social History were reviewed in [employer] record.   ? ? ?Review of Systems: ?Patient denies any headaches, hearing loss, fatigue, blurred vision, shortness of breath, chest pain, abdominal pain, problems with bowel movements, urination, or intercourse. No joint pain or mood swings.  ?+vaginal spotting for 2 days with some cramping ? ? ?Physical Exam:BP (!) 151/90 (BP Location: Right Arm, Patient Position: Sitting, Cuff Size: Large)   Pulse 76   Ht '5\' 6"'$  (1.676 m)   Wt 224 lb (101.6 kg)   BMI 36.15 kg/m?   ?General:  Well developed, well nourished, no acute distress ?Skin:  Warm and dry ?Neck:  Midline trachea, normal thyroid, good ROM, no lymphadenopathy ?Lungs; Clear to auscultation bilaterally ?Breast:  No dominant palpable mass, retraction, or nipple discharge,has some thickness at scar left breast ?Cardiovascular: Regular rate and rhythm ?Abdomen:  Soft, non tender, no hepatosplenomegaly ?Pelvic:  External genitalia is normal in appearance, no lesions.  The vagina is normal in appearance. Urethra has no lesions or masses. The cervix is smooth, pap with HR HPV genotyping performed.  Uterus is felt to be normal size, shape, and contour.  No adnexal masses or tenderness noted.Bladder is non tender, no masses  felt. ?Rectal: Good sphincter tone, no polyps, or hemorrhoids felt.  Hemoccult negative. ?Extremities/musculoskeletal:  No swelling or varicosities noted, no clubbing or cyanosis ?Psych:  No mood changes, alert and cooperative,seems happy ?AA is 1 ?Fall risk is moderate ? ?  01/26/2022  ?  2:03 PM 12/19/2020  ? 10:48 AM 11/13/2019  ?  1:46 PM  ?Depression screen PHQ 2/9  ?Decreased Interest 0 1 0  ?Down, Depressed, Hopeless 0 1 0  ?PHQ - 2 Score 0 2 0  ?Altered sleeping 1 1   ?Tired, decreased energy 1 0   ?Change in appetite 0 0   ?Feeling bad or failure about yourself  0 0   ?Trouble concentrating 0 0   ?Moving slowly or fidgety/restless 0 0   ?Suicidal thoughts 0 0   ?PHQ-9 Score 2 3   ?  ? ?  01/26/2022  ?  2:03 PM 12/19/2020  ? 10:49 AM  ?GAD 7 : Generalized Anxiety Score  ?Nervous, Anxious, on Edge 0 0  ?Control/stop worrying 0 0  ?Worry too much - different things 0 0  ?Trouble relaxing 0 0  ?Restless 0 0  ?Easily annoyed or irritable 0 0  ?Afraid - awful might happen 0 0  ?Total GAD 7 Score 0 0  ? ? Upstream - 01/26/22 1345   ? ?  ? Pregnancy Intention Screening  ? Does the patient want to become pregnant in the next year? No   ? Does the patient's partner want to become pregnant in the next year? No   ?  Would the patient like to discuss contraceptive options today? No   ?  ? Contraception Wrap Up  ? Current Method No Method - Other Reason   postmenopausal  ? End Method No Method - Other Reason   postmenopausal  ? Contraception Counseling Provided No   ? ?  ?  ? ?  ?  ? Examination chaperoned by Levy Pupa LPN ? ?Impression and Plan: ?1. Encounter for gynecological examination with Papanicolaou smear of cervix ?Pap sent ?Physical in 1 year ?Pap in 3 years if normal  ?Mammogram yearly  ? ?2. Encounter for screening fecal occult blood testing ? ?3. History of breast cancer ?Mammogram yearly  ? ?4. PMB (postmenopausal bleeding) ?Will get [place] to assess uterus and ovaries ?She is aware that may need biopsy if thickened  endometrium  ?Will talk when [place] results back  ? ?5. Papanicolaou smear of cervix with positive high risk human papilloma virus (HPV) test ?Pap sent ? ? ? ? ?  ?  ?

## 2022-02-01 ENCOUNTER — Ambulatory Visit (HOSPITAL_COMMUNITY)
Admission: RE | Admit: 2022-02-01 | Discharge: 2022-02-01 | Disposition: A | Discharge status: Home / Self Care | Payer: 59 | Source: Ambulatory Visit | Attending: Adult Health | Admitting: Adult Health

## 2022-02-01 DIAGNOSIS — N950 Postmenopausal bleeding: Secondary | ICD-10-CM | POA: Diagnosis present

## 2022-02-01 LAB — CYTOLOGY - PAP
Adequacy: ABSENT
Comment: NEGATIVE
Diagnosis: NEGATIVE
High risk HPV: NEGATIVE

## 2022-02-02 ENCOUNTER — Telehealth: Payer: Self-pay | Admitting: Adult Health

## 2022-02-02 DIAGNOSIS — N950 Postmenopausal bleeding: Secondary | ICD-10-CM

## 2022-02-02 NOTE — Telephone Encounter (Signed)
Pt aware that endometrium is 6 mm will get endometrial biopsy scheduled.  ?

## 2022-02-09 ENCOUNTER — Encounter: Payer: Self-pay | Admitting: Obstetrics & Gynecology

## 2022-02-09 ENCOUNTER — Other Ambulatory Visit (HOSPITAL_COMMUNITY)
Admission: RE | Admit: 2022-02-09 | Discharge: 2022-02-09 | Disposition: A | Discharge status: Home / Self Care | Payer: 59 | Source: Ambulatory Visit | Attending: Obstetrics & Gynecology | Admitting: Obstetrics & Gynecology

## 2022-02-09 ENCOUNTER — Ambulatory Visit (INDEPENDENT_AMBULATORY_CARE_PROVIDER_SITE_OTHER): Payer: 59 | Admitting: Obstetrics & Gynecology

## 2022-02-09 VITALS — BP 143/94 | HR 81 | Ht 66.0 in | Wt 222.2 lb

## 2022-02-09 DIAGNOSIS — N950 Postmenopausal bleeding: Secondary | ICD-10-CM | POA: Insufficient documentation

## 2022-02-09 DIAGNOSIS — R9389 Abnormal findings on diagnostic imaging of other specified body structures: Secondary | ICD-10-CM

## 2022-02-09 NOTE — Addendum Note (Signed)
Addended by: Linton Rump on: 02/09/2022 12:23 PM ? ? Modules accepted: Orders ? ?

## 2022-02-09 NOTE — Progress Notes (Signed)
? ?GYN VISIT ?Patient name: Jessica Simpson MRN [id number]  Date of birth: 07/03/1971 ?Chief Complaint:   ?endometrial biopsy ? ?History of Present Illness:   ?Jessica Simpson is a 51 y.o. G1P1001 PM female with h/o breast Ca being seen today for: ? ?-Postmenopausal bleeding: She notes some spotting a few mos ago.  Only happened x 1 mos, only lasted a few days (2-3 days).  Mostly when she wiped.  Not sure what color. ?Maybe some mild cramping at that time, but no pain currently.   ?No bleeding since that time, denies vaginal discharge, itching or anything abnormal. ? ?Seen by J.Griffin on 01/26/22.  [place] completed 4/10: 7.9cm anteverted uterus, 34m thickness. Normal right ovary, left ovary not seen ? ?Previously on chem medication until Dec. 2019. ?No LMP recorded. (Menstrual status: Other). ? ? ?  01/26/2022  ?  2:03 PM 12/19/2020  ? 10:48 AM 11/13/2019  ?  1:46 PM 09/01/2018  ? 12:26 PM 04/25/2018  ?  8:37 AM  ?Depression screen PHQ 2/9  ?Decreased Interest 0 1 0  0  ?Down, Depressed, Hopeless 0 1 0 0 0  ?PHQ - 2 Score 0 2 0 0 0  ?Altered sleeping '1 1  1   '$ ?Tired, decreased energy 1 0  1   ?Change in appetite 0 0  0   ?Feeling bad or failure about yourself  0 0  0   ?Trouble concentrating 0 0  0   ?Moving slowly or fidgety/restless 0 0  0   ?Suicidal thoughts 0 0  0   ?PHQ-9 Score '2 3  2   '$ ?Difficult doing work/chores    Not difficult at all   ? ? ? ?Review of Systems:   ?Pertinent items are noted in HPI ?Denies fever/chills, dizziness, headaches, visual disturbances, fatigue, shortness of breath, chest pain, abdominal pain, vomiting, , bowel movements, urination, or intercourse unless otherwise stated above.  ?Pertinent History Reviewed:  ?Reviewed past medical,surgical, social, obstetrical and family history.  ?Reviewed problem list, medications and allergies. ?Physical Assessment:  ? ?Vitals:  ? 02/09/22 1156  ?BP: (!) 143/94  ?Pulse: 81  ?Weight: 222 lb 3.2 oz (100.8 kg)  ?Height: '5\' 6"'$  (1.676 m)  ?Body  mass index is 35.86 kg/m?. ? ?     Physical Examination:  ? General appearance: alert, well appearing, and in no distress ? Psych: mood appropriate, normal affect ? Skin: warm & dry  ? Cardiovascular: normal heart rate noted ? Respiratory: normal respiratory effort, no distress ? Abdomen: soft, non-tender  ? Pelvic: VULVA: normal appearing vulva with no masses, tenderness or lesions, VAGINA: normal appearing vagina with normal color and discharge, no lesions, CERVIX: normal appearing cervix without discharge or lesions, UTERUS: uterus is normal size, shape, consistency and nontender ? Extremities: no edema  ? ?Chaperone: JLevy Pupa  ? ?Endometrial Biopsy Procedure Note ? ?Pre-operative Diagnosis: thickened endometrium, PMB ? ?Post-operative Diagnosis: same ? ?Procedure Details  ?The risks (including infection, bleeding, pain, and uterine perforation) and benefits of the procedure were explained to the patient and Written informed consent was obtained.  Antibiotic prophylaxis against endocarditis was not indicated. ? ? The patient was placed in the dorsal lithotomy position.  Bimanual exam showed the uterus to be in the neutral position.  A speculum inserted in the vagina, and the cervix prepped with betadine.   ? ? A single tooth tenaculum was applied to the anterior lip of the cervix for stabilization.  A sterile uterine sound  was used to sound the uterus to a depth of 8.  A Pipelle endometrial aspirator was used to sample the endometrium.  Sample was sent for pathologic examination. ? ?Condition: ?Stable ? ?Complications: ?None ? ? ? ?Assessment & Plan:  ?1) Thickened endometrium, postmenopausal bleeding ?- Discussed potential etiologies and reviewed work up ?-based on thickened endometrium, recommend EMB today ?-see procedure above ?-Next step pending results of pathology ?The patient was advised to call for any fever or for prolonged or severe pain or bleeding. She was advised to use OTC analgesics as needed  for mild to moderate pain. She was advised to avoid vaginal intercourse for 48 hours or until the bleeding has completely stopped. ? ? ?Return in about 1 year (around 02/10/2023) for annual. ? ? ?Janyth Pupa, DO ?Attending [place] ?[facility] ? ? ? ?

## 2022-02-10 LAB — SURGICAL PATHOLOGY

## 2022-04-23 NOTE — Progress Notes (Signed)
Patient Care Team: Andres Shad, MD as PCP - General (Family Medicine) Neldon Mc, MD as Surgeon (General Surgery) Everardo All, MD (Hematology and Oncology)  DIAGNOSIS:  Encounter Diagnosis  Name Primary?   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, estrogen receptor negative ([place])     SUMMARY OF ONCOLOGIC HISTORY: Oncology History  Cancer of left breast ([place])  06/30/2011 Initial Diagnosis   Breast cancer, IDC, Left, Stage III, Triple negative, 7.6 cm breast mass and palpable axillary mass   09/06/2011 Miscellaneous   BRCA 1 and 2: Negative   09/14/2011 - 11/23/2011 Neo-Adjuvant Chemotherapy   Dose dense FEC followed by dose dense Taxotere   12/21/2011 Surgery   Left lumpectomy: High-grade poorly differentiated IDC 1.7 cm with high-grade DCIS, margins negative, 3/7 lymph nodes positive, ER 0%, PR 0%, HER-2 negative ratio 1.33 T1CN1 stage IIb   12/31/2011 - 02/15/2012 Radiation Therapy   Radiation at [facility]   09/26/2017 Relapse/Recurrence   Left breast upper outer quadrant within the lumpectomy bed: Fibrosis no malignancy, right axillary lymph node biopsy metastatic high-grade carcinoma ER 0%, PR 0%, Ki-67 90%, HER-2 negative ratio 1.11 (similar to previous ductal carcinoma)   11/14/2017 - 03/06/2018 Neo-Adjuvant Chemotherapy   Neo-Adjuvant chemotherapy with Gemzar and carboplatin days 1 and 8 every 3 weeks   01/10/2018 Genetic Testing   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  The Hereditary Gene Panel offered by [facility] includes sequencing and/or deletion duplication testing of the following 47 genes: APC, ATM, AXIN2, BARD1, BMPR1A, BRCA1, BRCA2, BRIP1, CDH1, CDK4, CDKN2A (p14ARF), CDKN2A (p16INK4a), CHEK2, CTNNA1, DICER1, EPCAM (Deletion/duplication testing only), GREM1 (promoter region deletion/duplication testing only), KIT, MEN1, MLH1, MSH2, MSH3, MSH6, MUTYH, NBN, NF1, NHTL1, PALB2, PDGFRA, PMS2,  POLD1, POLE, PTEN, RAD50, RAD51C, RAD51D, SDHB, SDHC, SDHD, SMAD4, SMARCA4. STK11, TP53, TSC1, TSC2, and VHL.  The following genes were evaluated for sequence changes only: SDHA and HOXB13 c.251G>A variant only. The report date is January 10, 2018.    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 ([place])  04/06/2018 Surgery   Right axillary lymph node dissection: 1/11 node positive for metastatic high-grade carcinoma   04/25/2018 Initial Diagnosis   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 ([place])   04/25/2018 Cancer Staging   Staging form: Breast, AJCC 8th Edition - Clinical: Stage IIB (cT0, cN1, cM0, G3, ER-, PR-, HER2-) - Signed by Eppie Gibson, MD on 04/25/2018   05/16/2018 - 06/22/2018 Radiation Therapy   Adjuvant radiation therapy with Xeloda   07/11/2018 - 10/24/2018 Chemotherapy   Adjuvant chemotherapy with CMF     CHIEF COMPLIANT:  Follow-up of triple negative right breast cancer   INTERVAL HISTORY: Jessica Simpson is a 51 y.o. with above-mentioned history of triple negative left breast cancer. She presents to the clinic today for a follow-up.  She is undergoing work-up for her heart.  Apparently there were some issues related to low heart rate and irregular heart rate.  She had previous ablation done in March 2023.  ALLERGIES:  is allergic to codeine and lortab [hydrocodone-acetaminophen].  MEDICATIONS:  Current Outpatient Medications  Medication Sig Dispense Refill   acetaminophen (TYLENOL) 500 MG tablet Take 500 mg by mouth every 6 (six) hours as needed. For pain     amLODipine (NORVASC) 5 MG tablet Take 5 mg by mouth daily.  6   Cholecalciferol 5000 units capsule Take 1 capsule (5,000 Units total) by mouth daily.     ibuprofen (  ADVIL) 800 MG tablet Take 1 tablet (800 mg total) by mouth every 8 (eight) hours as needed. 30 tablet 0   loratadine (CLARITIN) 10 MG tablet Take 10 mg by mouth daily.     Omega-3 Fatty Acids (FISH OIL MAXIMUM STRENGTH) 1200 MG CPDR Take 1  capsule by mouth daily.     vitamin B-12 (CYANOCOBALAMIN) 500 MCG tablet Take 500 mcg by mouth daily.     Vitamin D, Ergocalciferol, (DRISDOL) 1.25 MG (50000 UNIT) CAPS capsule Take 50,000 Units by mouth once a week.     No current facility-administered medications for this visit.   Facility-Administered Medications Ordered in Other Visits  Medication Dose Route Frequency Provider Last Rate Last Admin   lactated ringers infusion    Continuous PRN Kerby Less, CRNA   New Bag at 11/14/17 0700    PHYSICAL EXAMINATION: ECOG PERFORMANCE STATUS: 1 - Symptomatic but completely ambulatory  Vitals:   05/07/22 1150  BP: 136/75  Pulse: 69  Resp: 18  Temp: (!) 97.5 F (36.4 C)  SpO2: 100%   Filed Weights   05/07/22 1150  Weight: 225 lb 9.6 oz (102.3 kg)    BREAST: No palpable masses or nodules in either right or left breasts. No palpable axillary supraclavicular or infraclavicular adenopathy no breast tenderness or nipple discharge. (exam performed in the presence of a chaperone)  LABORATORY DATA:  I have reviewed the data as listed    Latest Ref Rng & Units 12/21/2018    2:02 PM 10/24/2018   10:53 AM 10/03/2018   10:07 AM  CMP  Glucose 70 - 99 mg/dL 78  92  131   BUN 6 - 20 mg/dL _0 Creatinine 0.44 - 1.00 mg/dL 0.89  0.82  0.98   Sodium 135 - 145 mmol/L 140  140  141   Potassium 3.5 - 5.1 mmol/L 3.6  4.0  3.5   Chloride 98 - 111 mmol/L 104  104  106   CO2 22 - 32 mmol/L _1 Calcium 8.9 - 10.3 mg/dL 9.5  9.5  9.6   Total Protein 6.5 - 8.1 g/dL 7.7  7.3  7.3   Total Bilirubin 0.3 - 1.2 mg/dL 0.4  0.3  0.3   Alkaline Phos 38 - 126 U/L 128  119  109   AST 15 - 41 U/L _2 ALT 0 - 44 U/L _3 Lab Results  Component Value Date   WBC 5.6 12/21/2018   HGB 12.0 12/21/2018   HCT 37.6 12/21/2018   MCV 81.0 12/21/2018   PLT 266 12/21/2018   NEUTROABS 3.7 12/21/2018    ASSESSMENT & PLAN:  Cancer of axillary tail of right female  breast ([place]) Prior left breast cancer 2012: Treated with neoadjuvant FEC-Taxotere status post lumpectomy radiation. Recurrence: Right axillary lymph node. Neoadjuvant chemo carboplatin and gemcitabine x6 cycles Right axillary lymph node dissection 1/11 lymph nodes positive Adjuvant radiation with Xeloda completed 06/19/18 Completed 6 cycles of CMF adjuvant chemo 10/24/2018   Breast cancer surveillance: 1.  Mammogram: 10/08/2021: Benign breast density category C 2.  Breast exam 05/07/2022: Benign, scar tissue is palpable in the left breast   Episodes of dizziness followed by severe fatigue: She is following with cardiology at [facility].  They have a cardiac monitor on her.  They may be discussing the role of pacemaker.   Lymphedema of upper  extremities: She is wearing glove and it is helping her.   Return to clinic in 1 year for follow-up    No orders of the defined types were placed in this encounter.  The patient has a good understanding of the overall plan. she agrees with it. she will call with any problems that may develop before the next visit here. Total time spent: 30 mins including face to face time and time spent for planning, charting and co-ordination of care   Harriette Ohara, MD 05/07/22    I Gardiner Coins am scribing for Dr. Lindi Adie  I have reviewed the above documentation for accuracy and completeness, and I agree with the above.

## 2022-05-07 ENCOUNTER — Inpatient Hospital Stay: Payer: 59 | Attending: Hematology and Oncology | Admitting: Hematology and Oncology

## 2022-05-07 ENCOUNTER — Other Ambulatory Visit: Payer: Self-pay

## 2022-05-07 DIAGNOSIS — Z9221 Personal history of antineoplastic chemotherapy: Secondary | ICD-10-CM | POA: Insufficient documentation

## 2022-05-07 DIAGNOSIS — R001 Bradycardia, unspecified: Secondary | ICD-10-CM | POA: Diagnosis not present

## 2022-05-07 DIAGNOSIS — C50611 Malignant neoplasm of axillary tail of right female breast: Secondary | ICD-10-CM | POA: Diagnosis present

## 2022-05-07 DIAGNOSIS — Z79899 Other long term (current) drug therapy: Secondary | ICD-10-CM | POA: Insufficient documentation

## 2022-05-07 DIAGNOSIS — Z171 Estrogen receptor negative status [ER-]: Secondary | ICD-10-CM | POA: Insufficient documentation

## 2022-05-07 DIAGNOSIS — I890 Lymphedema, not elsewhere classified: Secondary | ICD-10-CM | POA: Insufficient documentation

## 2022-05-07 DIAGNOSIS — Z923 Personal history of irradiation: Secondary | ICD-10-CM | POA: Diagnosis not present

## 2022-05-07 NOTE — Assessment & Plan Note (Signed)
Prior left breast cancer 2012: Treated with neoadjuvant FEC-Taxotere status post lumpectomy radiation. Recurrence: Right axillary lymph node. Neoadjuvant chemo carboplatin and gemcitabine x6 cycles Right axillary lymph node dissection 1/11 lymph nodes positive Adjuvant radiation with Xelodacompleted 06/19/18 Completed 6 cycles of CMF adjuvant chemo12/31/2019  Breast cancer surveillance: 1.Mammogram: 10/08/2021: Benign breast density category C 2.Breast exam7/14/2023: Benign, scar tissue is palpable in the left breast  Episodes of dizziness followed by severe fatigue: She is following with cardiology at [facility].  They have a cardiac monitor on her.  They may be discussing the role of pacemaker.  Lymphedema of upper extremities:She is wearing glove and it is helping her.  Return to clinic in 1 year for follow-up

## 2022-05-19 ENCOUNTER — Other Ambulatory Visit: Payer: Self-pay | Admitting: Adult Health

## 2022-05-19 MED ORDER — FLUCONAZOLE 150 MG PO TABS: ORAL_TABLET | ORAL | 1 refills | Status: DC

## 2022-05-19 NOTE — Progress Notes (Signed)
Will rx diflucan  

## 2022-08-26 ENCOUNTER — Other Ambulatory Visit: Payer: Self-pay | Admitting: Hematology and Oncology

## 2022-08-26 DIAGNOSIS — Z1231 Encounter for screening mammogram for malignant neoplasm of breast: Secondary | ICD-10-CM

## 2022-09-13 ENCOUNTER — Other Ambulatory Visit: Payer: Self-pay | Admitting: *Deleted

## 2022-09-13 ENCOUNTER — Other Ambulatory Visit: Payer: Self-pay | Admitting: Hematology and Oncology

## 2022-09-13 DIAGNOSIS — C50611 Malignant neoplasm of axillary tail of right female breast: Secondary | ICD-10-CM

## 2022-09-13 NOTE — Progress Notes (Signed)
Received call from pt with complaint of new onset left breast pain.  Pt denies recent injury or trauma.  Pt requesting upcoming mammogram be changed to diagnostic due to new onset of pain.  Verbal orders received by MD. Appt scheduled, pt educated and verbalized understanding.

## 2022-10-26 ENCOUNTER — Ambulatory Visit: Payer: 59

## 2022-10-29 ENCOUNTER — Ambulatory Visit
Admission: RE | Admit: 2022-10-29 | Discharge: 2022-10-29 | Disposition: A | Discharge status: Home / Self Care | Payer: 59 | Source: Ambulatory Visit | Attending: Hematology and Oncology | Admitting: Hematology and Oncology

## 2022-10-29 ENCOUNTER — Other Ambulatory Visit: Payer: Self-pay | Admitting: Hematology and Oncology

## 2022-10-29 DIAGNOSIS — Z171 Estrogen receptor negative status [ER-]: Secondary | ICD-10-CM

## 2022-11-24 ENCOUNTER — Ambulatory Visit: Payer: 59 | Admitting: Hematology and Oncology

## 2022-11-29 ENCOUNTER — Telehealth: Payer: Self-pay | Admitting: *Deleted

## 2022-11-29 NOTE — Telephone Encounter (Signed)
Received call from pt with complaint of ongoing left breast pain despite recent normal breast scans.  Pt states pain is worse at night and denise swelling, redness, or warmth to the breast.  [facility] visit scheduled for further evaluation, pt educated and verbalized understanding.

## 2022-12-02 ENCOUNTER — Encounter: Payer: Self-pay | Admitting: Adult Health

## 2022-12-02 ENCOUNTER — Inpatient Hospital Stay: Payer: 59 | Attending: Adult Health | Admitting: Adult Health

## 2022-12-02 ENCOUNTER — Other Ambulatory Visit: Payer: Self-pay

## 2022-12-02 VITALS — BP 155/94 | HR 64 | Temp 97.5°F | Resp 18 | Wt 231.2 lb

## 2022-12-02 DIAGNOSIS — C50912 Malignant neoplasm of unspecified site of left female breast: Secondary | ICD-10-CM | POA: Diagnosis present

## 2022-12-02 DIAGNOSIS — C50611 Malignant neoplasm of axillary tail of right female breast: Secondary | ICD-10-CM | POA: Diagnosis not present

## 2022-12-02 DIAGNOSIS — I10 Essential (primary) hypertension: Secondary | ICD-10-CM | POA: Diagnosis not present

## 2022-12-02 DIAGNOSIS — Z171 Estrogen receptor negative status [ER-]: Secondary | ICD-10-CM | POA: Insufficient documentation

## 2022-12-02 DIAGNOSIS — Z923 Personal history of irradiation: Secondary | ICD-10-CM | POA: Diagnosis not present

## 2022-12-02 DIAGNOSIS — Z803 Family history of malignant neoplasm of breast: Secondary | ICD-10-CM | POA: Diagnosis not present

## 2022-12-02 DIAGNOSIS — C773 Secondary and unspecified malignant neoplasm of axilla and upper limb lymph nodes: Secondary | ICD-10-CM | POA: Diagnosis not present

## 2022-12-02 DIAGNOSIS — Z9221 Personal history of antineoplastic chemotherapy: Secondary | ICD-10-CM | POA: Insufficient documentation

## 2022-12-02 DIAGNOSIS — Z801 Family history of malignant neoplasm of trachea, bronchus and lung: Secondary | ICD-10-CM | POA: Insufficient documentation

## 2022-12-02 NOTE — Assessment & Plan Note (Signed)
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  We reviewed her breast pain in detail.  Her mammogram and ultrasound in January to evaluate this was negative.  Since it did not improve with drinking more water she will continue to do this.  We discussed the importance of limiting fatty foods because sometimes these foods can increase breast pain.  I reviewed with her that her physical exam is normal, her mammogram is normal, and the pain is atypical for the presentation of breast cancer.  Should the pain become more localized in 1 area or should she develop an increase or additional signs or symptoms she will let me know because we can certainly obtain a breast MRI since she does have breast density category C.    We reviewed breast density in detail and she verbalized understanding of the above and will call if she feels like she would benefit from a breast MRI.  She has follow-up with Dr. Lindi Adie in July 2024.  She knows she can call anytime between now and then if needed.

## 2022-12-02 NOTE — Progress Notes (Signed)
[facility] Follow up:    Jessica Shad, MD [address] 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 [facility]) Staging form: Breast, AJCC 8th Edition - Clinical: Stage IIB (cT0, cN1, cM0, G3, ER-, PR-, HER2-) - Signed by Eppie Gibson, MD on 04/25/2018 Histologic grading system: 3 grade system - Pathologic: No Stage Recommended (ypT0, pN1, cM0, G3, ER-, PR-, HER2-) - Signed by Eppie Gibson, MD on 04/25/2018 Stage prefix: Post-therapy Neoadjuvant therapy: Yes Histologic grading system: 3 grade system Laterality: Right  Cancer of left breast ([place]) Staging form: Breast, AJCC 7th Edition - Clinical: Stage IIIA (T3, N2, cM0) - Signed by Pieter Partridge, MD on 08/03/2011 Specimen type: Core Needle Biopsy Histopathologic type: Infiltrating duct carcinoma, NOS Tumor size (mm): 66 Histologic grade (G): G3 Prognostic indicators: Triple negative. KI-67 of 99%    - Pathologic: No stage assigned - Unsigned Specimen type: Core Needle Biopsy Histopathologic type: Infiltrating duct carcinoma, NOS Tumor size (mm): 66 Prognostic indicators: Triple negative. KI-67 of 99%      SUMMARY OF ONCOLOGIC HISTORY: Oncology History  Cancer of left breast ([place])  06/30/2011 Initial Diagnosis   Breast cancer, IDC, Left, Stage III, Triple negative, 7.6 cm breast mass and palpable axillary mass   09/06/2011 Miscellaneous   BRCA 1 and 2: Negative   09/14/2011 - 11/23/2011 Neo-Adjuvant Chemotherapy   Dose dense FEC followed by dose dense Taxotere   12/21/2011 Surgery   Left lumpectomy: High-grade poorly differentiated IDC 1.7 cm with high-grade DCIS, margins negative, 3/7 lymph nodes positive, ER 0%, PR 0%, HER-2 negative ratio 1.33 T1CN1 stage IIb   12/31/2011 - 02/15/2012 Radiation Therapy   Radiation at [facility]   09/26/2017 Relapse/Recurrence   Left breast upper outer quadrant within the lumpectomy bed: Fibrosis no malignancy,  right axillary lymph node biopsy metastatic high-grade carcinoma ER 0%, PR 0%, Ki-67 90%, HER-2 negative ratio 1.11 (similar to previous ductal carcinoma)   11/14/2017 - 03/06/2018 Neo-Adjuvant Chemotherapy   Neo-Adjuvant chemotherapy with Gemzar and carboplatin days 1 and 8 every 3 weeks   01/10/2018 Genetic Testing   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  The Hereditary Gene Panel offered by [facility] includes sequencing and/or deletion duplication testing of the following 47 genes: APC, ATM, AXIN2, BARD1, BMPR1A, BRCA1, BRCA2, BRIP1, CDH1, CDK4, CDKN2A (p14ARF), CDKN2A (p16INK4a), CHEK2, CTNNA1, DICER1, EPCAM (Deletion/duplication testing only), GREM1 (promoter region deletion/duplication testing only), KIT, MEN1, MLH1, MSH2, MSH3, MSH6, MUTYH, NBN, NF1, NHTL1, PALB2, PDGFRA, PMS2, POLD1, POLE, PTEN, RAD50, RAD51C, RAD51D, SDHB, SDHC, SDHD, SMAD4, SMARCA4. STK11, TP53, TSC1, TSC2, and VHL.  The following genes were evaluated for sequence changes only: SDHA and HOXB13 c.251G>A variant only. The report date is January 10, 2018.    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 female  returns for follow-up and evaluation of breast pain.  She notes that it is diffuse and throughout her entire left breast and it the pain will occur in different areas  of her breast at different times.  She denies any changes in the breast tissue and her skin, any swelling or tenderness accompanied with the breast pain.  He has had no nipple changes or discharge.  She did notice that when she started limiting her caffeine and drinking more water the breast pain improved.  She underwent upper diagnostic bilateral mammogram with tomosynthesis and ultrasound of her right breast on October 29, 2022 which demonstrated stable bilateral breast lumpectomy sites, no mammographic evidence of malignancy in bilateral breasts and breast density category C.   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 ([place]) 04/25/2018   Genetic testing 01/12/2018   Family history of breast cancer    Port-A-Cath in place 11/21/2017   Mass of axillary tail of right breast 08/24/2017   Pain with urination 08/24/2017   Urinary frequency 08/24/2017   Hematuria 08/24/2017   Encounter for well woman exam with routine gynecological exam 01/15/2016   Benign cyst of right breast 11/29/2015   Positive fecal occult blood test 07/22/2014   History of breast cancer 07/22/2014   Rectal bleeding 07/10/2014   Lymphedema 06/15/2012   BRCA1 negative 10/22/2011   BRCA2 negative 10/22/2011   Cancer of left breast ([place]) 06/30/2011    Class: Diagnosis of    is allergic to codeine and lortab [hydrocodone-acetaminophen].  MEDICAL HISTORY: Past Medical History:  Diagnosis Date   BRCA1 negative 10/22/2011   BRCA2 negative 10/22/2011   Breast cancer,  IDC, Left, Stage III, Triple negative 06/30/2011   Breast disorder    Contraceptive education 01/15/2016   Family history of breast cancer    Fracture    right 4th toe   History of breast cancer 07/22/2014   History of radiation therapy 05/16/18- 06/22/18   Right Breast/ 50.4 Gy in 28 fractions. Right posterior axilla and SCV nodes/ 50.4 Gy in 28 fractions.    Hypertension    Lymphedema 06/15/2012   Lymphedema of arm    Papanicolaou smear of cervix with positive high risk human papilloma virus (HPV) test 12/25/2020   12/19/2020 repeat pap in 1 year per ASCCP guidelines, 5 year CIN3+risk is 2.25%   Personal history of chemotherapy    Personal history of radiation therapy    Positive fecal occult blood test 07/22/2014   S/P radiation therapy 2013   50 gray in 25 fractions to the left breast, supraclavicular, and axillary regions. She then received a boost to the left lumpectomy of 10 gray in 5 fractions. This was given at [facility].   Status post chemotherapy Comp. 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   AXILLARY LYMPH NODE BIOPSY Right 04/06/2018   AXILLARY LYMPH NODE DISSECTION Right 04/06/2018   Procedure: RIGHT AXILLARY LYMPH  NODE DISSECTION;  Surgeon: Rolm Bookbinder, MD;  Location: [facility];  Service: General;  Laterality: Right;   BREAST BIOPSY Left 2012   BREAST BIOPSY Right 2019   BREAST LUMPECTOMY Left 2012   BREAST LUMPECTOMY Right 2019   rt axilla   BREAST SURGERY     CARDIAC ELECTROPHYSIOLOGY MAPPING AND ABLATION     CESAREAN SECTION     COLONOSCOPY N/A 08/14/2014   Procedure: COLONOSCOPY;  Surgeon: Rogene Houston, MD;  [facility];  Service: Endoscopy;  Laterality: N/A;  [place]  12/21/2011   Procedure: EVACUATION HEMATOMA BREAST;  Surgeon: Stark Klein, MD;  Location: [facility];   Service: General;  Laterality: Left;   HAND SURGERY     tumor on finger, left hand   INCISION AND DRAINAGE ABSCESS ANAL     x3   left breast biopsy with axillary biopsy     core bx   PORT-A-CATH REMOVAL  12/21/2011   Procedure: REMOVAL PORT-A-CATH;  Surgeon: Haywood Lasso, MD;  Location: [facility];  Service: General;  Laterality: N/A;   PORTACATH PLACEMENT  08/02/2011   dr Harrel Carina PLACEMENT Right 11/14/2017   Procedure: INSERTION PORT-A-CATH WITH [place];  Surgeon: Rolm Bookbinder, MD;  Location: [facility];  Service: General;  Laterality: Right;   removal breast mass  2004   benign   TREATMENT FISTULA ANAL     TUMOR REMOVAL     on left hand   TUMOR REMOVAL     rt. eye    SOCIAL HISTORY: Social History   Socioeconomic History   Marital status: Single    Spouse name: Not on file   Number of children: Not on file   Years of education: Not on file   Highest education level: Not on file  Occupational History   Not on file  Tobacco Use   Smoking status: Never   Smokeless tobacco: Never  Vaping Use   Vaping Use: Never used  Substance and Sexual Activity   Alcohol use: No   Drug use: No   Sexual activity: Yes    Birth control/protection: Post-menopausal  Other Topics Concern   Not on file  Social History Narrative   Not on file   Social Determinants of Health   Financial Resource Strain: Low Risk  (01/26/2022)   Overall Financial Resource Strain (CARDIA)    Difficulty of Paying Living Expenses: Not very hard  Food Insecurity: No Food Insecurity (01/26/2022)   Hunger Vital Sign    Worried About Running Out of Food in the Last Year: Never true    Ran Out of Food in the Last Year: Never true  Transportation Needs: No Transportation Needs (01/26/2022)   PRAPARE - Hydrologist (Medical): No    Lack of Transportation (Non-Medical): No  Physical Activity: Insufficiently Active (01/26/2022)   Exercise Vital Sign    Days of  Exercise per Week: 3 days    Minutes of Exercise per Session: 30 min  Stress: No Stress Concern Present (01/26/2022)   [place]    Feeling of Stress : Only a little  Social Connections: Moderately Integrated (01/26/2022)   Social Connection and Isolation Panel [NHANES]    Frequency of Communication with Friends and Family: More than three times a week    Frequency of Social Gatherings with Friends and Family: Once a week    Attends Religious Services:  More than 4 times per year    Active Member of Clubs or Organizations: Yes    Attends Archivist Meetings: More than 4 times per year    Marital Status: Never married  Intimate Partner Violence: Not At Risk (01/26/2022)   Humiliation, Afraid, Rape, and Kick questionnaire    Fear of Current or Ex-Partner: No    Emotionally Abused: No    Physically Abused: No    Sexually Abused: No    FAMILY HISTORY: Family History  Problem Relation Age of Onset   Cancer Father 35       lung cancer    Cancer Maternal Grandmother        breast   Cancer Maternal Grandfather    Breast cancer Other        MGMs mother   Colon cancer Neg Hx     Review of Systems  Constitutional:  Negative for appetite change, chills, fatigue, fever and unexpected weight change.  HENT:   Negative for hearing loss, lump/mass and trouble swallowing.   Eyes:  Negative for eye problems and icterus.  Respiratory:  Negative for chest tightness, cough and shortness of breath.   Cardiovascular:  Negative for chest pain, leg swelling and palpitations.  Gastrointestinal:  Negative for abdominal distention, abdominal pain, constipation, diarrhea, nausea and vomiting.  Endocrine: Negative for hot flashes.  Genitourinary:  Negative for difficulty urinating.   Musculoskeletal:  Negative for arthralgias.  Skin:  Negative for itching and rash.  Neurological:  Negative for dizziness, extremity weakness, headaches  and numbness.  Hematological:  Negative for adenopathy. Does not bruise/bleed easily.  Psychiatric/Behavioral:  Negative for depression. The patient is not nervous/anxious.       PHYSICAL EXAMINATION  ECOG PERFORMANCE STATUS: 1 - Symptomatic but completely ambulatory  Vitals:   12/02/22 1050  BP: (!) 155/94  Pulse: 64  Resp: 18  Temp: (!) 97.5 F (36.4 C)  SpO2: 100%    Physical Exam Constitutional:      General: She is not in acute distress.    Appearance: Normal appearance. She is not toxic-appearing.  HENT:     Head: Normocephalic and atraumatic.  Eyes:     General: No scleral icterus. Cardiovascular:     Rate and Rhythm: Normal rate and regular rhythm.     Pulses: Normal pulses.     Heart sounds: Normal heart sounds.  Pulmonary:     Effort: Pulmonary effort is normal.     Breath sounds: Normal breath sounds.  Chest:     Comments: Right breast status postlumpectomy and radiation no sign of local recurrence left breast status postlumpectomy and radiation no sign of local recurrence. Abdominal:     General: Abdomen is flat. Bowel sounds are normal. There is no distension.     Palpations: Abdomen is soft.     Tenderness: There is no abdominal tenderness.  Musculoskeletal:        General: No swelling.     Cervical back: Neck supple.  Lymphadenopathy:     Cervical: No cervical adenopathy.  Skin:    General: Skin is warm and dry.     Findings: No rash.  Neurological:     General: No focal deficit present.     Mental Status: She is alert.  Psychiatric:        Mood and Affect: Mood normal.        Behavior: Behavior normal.     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  We reviewed her breast pain in detail.  Her mammogram  and ultrasound in January to evaluate this was negative.  Since it did not improve with drinking more water she will continue to do this.  We discussed the importance of limiting fatty foods because sometimes these foods can increase breast pain.  I reviewed with her that her physical exam is normal, her mammogram is normal, and the pain is atypical for the presentation of breast cancer.  Should the pain become more localized in 1 area or should she develop an increase or additional signs or symptoms she will let me know because we can certainly obtain a breast MRI since she does have breast density category C.    We reviewed breast density in detail and she verbalized understanding of the above and will call if she feels like she would benefit from a breast MRI.  She has follow-up with Dr. Lindi Adie in July 2024.  She knows she can call anytime between now and then if needed.    All questions were answered. The patient knows to call the clinic with any problems, questions or concerns. We can certainly see the patient much sooner if necessary.  Total encounter time:20 minutes*in face-to-face visit time, chart review, lab review, care coordination, order entry, and documentation of the encounter time.    Wilber Bihari, NP 12/02/22 11:29 AM Medical Oncology and Hematology [facility] [place], [place] [address] Tel. [phone]    Fax. [phone]  *Total Encounter Time as defined by the Centers for Medicare and Medicaid Services includes, in addition to the face-to-face time of a patient visit (documented in the note above) non-face-to-face time: obtaining and reviewing outside history, ordering and reviewing medications, tests or procedures, care coordination (communications with other health care professionals or caregivers) and documentation in the medical record.

## 2023-05-07 NOTE — Progress Notes (Signed)
Patient Care Team: Arlina Robes, MD as PCP - General (Family Medicine) Cyndia Bent, MD (Inactive) as Surgeon (General Surgery) Glenford Peers, MD (Hematology and Oncology)  DIAGNOSIS:  Encounter Diagnosis  Name Primary?   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33 T1CN1 stage IIb   12/31/2011 - 02/15/2012 Radiation Therapy   Radiation at [facility]   09/26/2017 Relapse/Recurrence   Left breast upper outer quadrant within the lumpectomy bed: Fibrosis no malignancy, right axillary lymph node biopsy metastatic high-grade carcinoma ER 0%, PR 0%, Ki-67 90%, HER-2 negative ratio 1.11 (similar to previous ductal carcinoma)   11/14/2017 - 03/06/2018 Neo-Adjuvant Chemotherapy   Neo-Adjuvant chemotherapy with Gemzar and carboplatin days 1 and 8 every 3 weeks   01/10/2018 Genetic Testing   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  The Hereditary Gene Panel offered by [facility] includes sequencing and/or deletion duplication testing of the following 47 genes: APC, ATM, AXIN2, BARD1, BMPR1A, BRCA1, BRCA2, BRIP1, CDH1, CDK4, CDKN2A (p14ARF), CDKN2A (p16INK4a), CHEK2, CTNNA1, DICER1, EPCAM (Deletion/duplication testing only), GREM1 (promoter region deletion/duplication testing only), KIT, MEN1, MLH1, MSH2, MSH3, MSH6, MUTYH, NBN, NF1, NHTL1, PALB2,  PDGFRA, PMS2, POLD1, POLE, PTEN, RAD50, RAD51C, RAD51D, SDHB, SDHC, SDHD, SMAD4, SMARCA4. STK11, TP53, TSC1, TSC2, and VHL.  The following genes were evaluated for sequence changes only: SDHA and HOXB13 c.251G>A variant only. The report date is January 10, 2018.    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 with above-mentioned history of triple negative left breast cancer. She presents to the clinic today for a follow-up. Pt reports that in April she had to get an pacemaker. Every since she has it she has had pain in the shoulder blades. The pain is not constant. But when it comes it is there for a while. Steroids and muscle relaxer's did not relieve the pain. Pain would come and go.    ALLERGIES:  is allergic to codeine and lortab [hydrocodone-acetaminophen].  MEDICATIONS:  Current Outpatient Medications  Medication Sig Dispense Refill   acetaminophen (TYLENOL) 500 MG tablet Take 500 mg by mouth every 6 (six) hours as needed. For pain     amLODipine (NORVASC) 5 MG tablet Take 5 mg  by mouth daily.  6   Cholecalciferol 5000 units capsule Take 1 capsule (5,000 Units total) by mouth daily.     fluconazole (DIFLUCAN) 150 MG tablet Take 1 now and 1 in 3 days 2 tablet 1   ibuprofen (ADVIL) 800 MG tablet Take 1 tablet (800 mg total) by mouth  every 8 (eight) hours as needed. 30 tablet 0   loratadine (CLARITIN) 10 MG tablet Take 10 mg by mouth daily.     Omega-3 Fatty Acids (FISH OIL MAXIMUM STRENGTH) 1200 MG CPDR Take 1 capsule by mouth daily.     vitamin B-12 (CYANOCOBALAMIN) 500 MCG tablet Take 500 mcg by mouth daily.     Vitamin D, Ergocalciferol, (DRISDOL) 1.25 MG (50000 UNIT) CAPS capsule Take 50,000 Units by mouth once a week.     No current facility-administered medications for this visit.   Facility-Administered Medications Ordered in Other Visits  Medication Dose Route Frequency Provider Last Rate Last Admin   lactated ringers infusion    Continuous PRN Andree Elk, CRNA   New Bag at 11/14/17 0700    PHYSICAL EXAMINATION: ECOG PERFORMANCE STATUS: 1 - Symptomatic but completely ambulatory  Vitals:   05/10/23 1150  BP: (!) 158/86  Pulse: 84  Resp: 18  Temp: (!) 97.5 F (36.4 C)  SpO2: 99%   Filed Weights   05/10/23 1150  Weight: 227 lb 1.6 oz (103 kg)    BREAST: No palpable masses or nodules in either right or left breasts. No palpable axillary supraclavicular or infraclavicular adenopathy no breast tenderness or nipple discharge. (exam performed in the presence of a chaperone)  LABORATORY DATA:  I have reviewed the data as listed    Latest Ref Rng & Units 12/21/2018    2:02 PM 10/24/2018   10:53 AM 10/03/2018   10:07 AM  CMP  Glucose 70 - 99 mg/dL 78  92  409   BUN 6 - 20 mg/dL 10  10  12    Creatinine 0.44 - 1.00 mg/dL 8.11  9.14  7.82   Sodium 135 - 145 mmol/L 140  140  141   Potassium 3.5 - 5.1 mmol/L 3.6  4.0  3.5   Chloride 98 - 111 mmol/L 104  104  106   CO2 22 - 32 mmol/L 25  26  27    Calcium 8.9 - 10.3 mg/dL 9.5  9.5  9.6   Total Protein 6.5 - 8.1 g/dL 7.7  7.3  7.3   Total Bilirubin 0.3 - 1.2 mg/dL 0.4  0.3  0.3   Alkaline Phos 38 - 126 U/L 128  119  109   AST 15 - 41 U/L 15  14  17    ALT 0 - 44 U/L 14  14  20      Lab Results  Component Value Date   WBC 5.6 12/21/2018   HGB  12.0 12/21/2018   HCT 37.6 12/21/2018   MCV 81.0 12/21/2018   PLT 266 12/21/2018   NEUTROABS 3.7 12/21/2018    ASSESSMENT & PLAN:  Cancer of left breast [facility]) Prior left breast cancer 2012: Treated with neoadjuvant FEC-Taxotere status post lumpectomy radiation. Recurrence: Right axillary lymph node. Neoadjuvant chemo carboplatin and gemcitabine x6 cycles Right axillary lymph node dissection 1/11 lymph nodes positive Adjuvant radiation with Xeloda completed 06/19/18 Completed 6 cycles of CMF adjuvant chemo 10/24/2018   Breast cancer surveillance: 1.  Mammogram: 10/29/2022: Benign breast density category C 2.  Breast exam 05/10/2023: Benign, scar tissue is palpable in the  left breast   Episodes of dizziness followed by severe fatigue: She is following with cardiology at [facility]  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   Lymphedema of upper extremities:  Pain between shoulder blades: Will obtain a CT chest abdomen pelvis for further evaluation  Telephone visit after the scan to discuss results. Return to clinic in 1 year for follow-up    Orders Placed This Encounter  Procedures   CT CHEST ABDOMEN PELVIS WO CONTRAST    Standing Status:   Future    Standing Expiration Date:   05/09/2024    Order Specific Question:   Is patient pregnant?    Answer:   No    Order Specific Question:   Preferred imaging location?    Answer:   [facility]    Order Specific Question:   If indicated for the ordered procedure, I authorize the administration of oral contrast media per Radiology protocol    Answer:   Yes    Order Specific Question:   Does the patient have a contrast media/X-ray dye allergy?    Answer:   No    Order Specific Question:   Release to patient    Answer:   Immediate   The patient has a good understanding of the overall plan. she agrees with it. she will call with any problems that may develop before the next visit here. Total time spent: 30 mins including face to face time and time  spent for planning, charting and co-ordination of care   Tamsen Meek, MD 05/10/23    I Janan Ridge am acting as a Neurosurgeon for [employer]  I have reviewed the above documentation for accuracy and completeness, and I agree with the above.

## 2023-05-10 ENCOUNTER — Other Ambulatory Visit: Payer: Self-pay

## 2023-05-10 ENCOUNTER — Inpatient Hospital Stay: Payer: 59 | Attending: Hematology and Oncology | Admitting: Hematology and Oncology

## 2023-05-10 VITALS — BP 158/86 | HR 84 | Temp 97.5°F | Resp 18 | Ht 66.0 in | Wt 227.1 lb

## 2023-05-10 DIAGNOSIS — I890 Lymphedema, not elsewhere classified: Secondary | ICD-10-CM | POA: Diagnosis not present

## 2023-05-10 DIAGNOSIS — Z853 Personal history of malignant neoplasm of breast: Secondary | ICD-10-CM | POA: Insufficient documentation

## 2023-05-10 DIAGNOSIS — C50012 Malignant neoplasm of nipple and areola, left female breast: Secondary | ICD-10-CM | POA: Diagnosis not present

## 2023-05-10 DIAGNOSIS — Z923 Personal history of irradiation: Secondary | ICD-10-CM | POA: Insufficient documentation

## 2023-05-10 NOTE — Assessment & Plan Note (Addendum)
Prior left breast cancer 2012: Treated with neoadjuvant FEC-Taxotere status post lumpectomy radiation. Recurrence: Right axillary lymph node. Neoadjuvant chemo carboplatin and gemcitabine x6 cycles Right axillary lymph node dissection 1/11 lymph nodes positive Adjuvant radiation with Xeloda completed 06/19/18 Completed 6 cycles of CMF adjuvant chemo 10/24/2018   Breast cancer surveillance: 1.  Mammogram: 10/29/2022: Benign breast density category C 2.  Breast exam 05/10/2023: Benign, scar tissue is palpable in the left breast   Episodes of dizziness followed by severe fatigue: She is following with cardiology at [facility].  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   Lymphedema of upper extremities:  Pain between shoulder blades: Will obtain a CT chest abdomen pelvis for further evaluation  Telephone visit after the scan to discuss results. Return to clinic in 1 year for follow-up

## 2023-05-19 ENCOUNTER — Encounter: Payer: Self-pay | Admitting: Hematology and Oncology

## 2023-05-20 ENCOUNTER — Encounter (HOSPITAL_COMMUNITY): Payer: Self-pay

## 2023-05-20 ENCOUNTER — Ambulatory Visit (HOSPITAL_COMMUNITY)
Admission: RE | Admit: 2023-05-20 | Discharge: 2023-05-20 | Disposition: A | Discharge status: Home / Self Care | Payer: 59 | Source: Ambulatory Visit | Attending: Hematology and Oncology | Admitting: Hematology and Oncology

## 2023-05-20 DIAGNOSIS — C50012 Malignant neoplasm of nipple and areola, left female breast: Secondary | ICD-10-CM | POA: Diagnosis not present

## 2023-05-26 ENCOUNTER — Telehealth: Payer: Self-pay

## 2023-05-26 ENCOUNTER — Encounter: Payer: Self-pay | Admitting: General Practice

## 2023-05-26 ENCOUNTER — Other Ambulatory Visit: Payer: Self-pay

## 2023-05-26 ENCOUNTER — Other Ambulatory Visit: Payer: Self-pay | Admitting: Hematology and Oncology

## 2023-05-26 DIAGNOSIS — C50012 Malignant neoplasm of nipple and areola, left female breast: Secondary | ICD-10-CM

## 2023-05-26 MED ORDER — TRAMADOL HCL 50 MG PO TABS: 50.0000 mg | ORAL_TABLET | Freq: Four times a day (QID) | ORAL | 0 refills | Status: DC | PRN

## 2023-05-26 NOTE — Progress Notes (Signed)
Oley Balm, MD  Caroleen Hamman, NT PROCEDURE / BIOPSY REVIEW Date: 05/26/23  Requested Biopsy site: ant mediastinal mass Reason for request: r/o met breast ca Imaging review: Best seen on CT 05/20/23  Decision: Approved Imaging modality to perform: CT Schedule with: Moderate Sedation Schedule for: Any VIR  Additional comments:   Please contact me with questions, concerns, or if issue pertaining to this request arise.  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

## 2023-05-26 NOTE — Telephone Encounter (Signed)
Returned Pt's call regarding pain management. Pt states she typically takes prescription ibuprofen but is unable to take for the next few days d/t CT biopsy. Pt asking if there is anything other than tylenol that she can take. Message relayed to MD, MD ordered tramadol. Pt verbalized understanding.

## 2023-05-26 NOTE — Telephone Encounter (Signed)
Called Pt regarding scans. MD ordered STAT CT biopsy and routine Pet scan to be done in the next week. STAT CT authorized. Waiting on call back from CT on when biopsy can be scheduled. PET scheduled for 06/02/23 at [facility] with arrival time of 0845. Pt will need to be NPO after midnight except for sips of water with morning meds. Will reach back out to Pt when CT biopsy is scheduled. Pt verbalized understanding.

## 2023-05-30 ENCOUNTER — Other Ambulatory Visit: Payer: Self-pay | Admitting: Student

## 2023-05-30 DIAGNOSIS — C50012 Malignant neoplasm of nipple and areola, left female breast: Secondary | ICD-10-CM

## 2023-05-31 ENCOUNTER — Ambulatory Visit (HOSPITAL_COMMUNITY)
Admission: RE | Admit: 2023-05-31 | Discharge: 2023-05-31 | Disposition: A | Discharge status: Home / Self Care | Payer: 59 | Source: Ambulatory Visit | Attending: Interventional Radiology | Admitting: Interventional Radiology

## 2023-05-31 ENCOUNTER — Ambulatory Visit (HOSPITAL_COMMUNITY)
Admission: RE | Admit: 2023-05-31 | Discharge: 2023-05-31 | Disposition: A | Discharge status: Home / Self Care | Payer: 59 | Source: Ambulatory Visit | Attending: Hematology and Oncology | Admitting: Hematology and Oncology

## 2023-05-31 ENCOUNTER — Other Ambulatory Visit: Payer: Self-pay

## 2023-05-31 ENCOUNTER — Encounter (HOSPITAL_COMMUNITY): Payer: Self-pay | Admitting: *Deleted

## 2023-05-31 DIAGNOSIS — I10 Essential (primary) hypertension: Secondary | ICD-10-CM | POA: Insufficient documentation

## 2023-05-31 DIAGNOSIS — C50012 Malignant neoplasm of nipple and areola, left female breast: Secondary | ICD-10-CM | POA: Diagnosis present

## 2023-05-31 LAB — PROTIME-INR
INR: 1 (ref 0.8–1.2)
Prothrombin Time: 13.8 seconds (ref 11.4–15.2)

## 2023-05-31 LAB — CBC
HCT: 36.2 % (ref 36.0–46.0)
Hemoglobin: 11.6 g/dL — ABNORMAL LOW (ref 12.0–15.0)
MCH: 26.4 pg (ref 26.0–34.0)
MCHC: 32 g/dL (ref 30.0–36.0)
MCV: 82.5 fL (ref 80.0–100.0)
Platelets: 255 10*3/uL (ref 150–400)
RBC: 4.39 MIL/uL (ref 3.87–5.11)
RDW: 14.3 % (ref 11.5–15.5)
WBC: 5.1 10*3/uL (ref 4.0–10.5)
nRBC: 0 % (ref 0.0–0.2)

## 2023-05-31 MED ORDER — OXYCODONE HCL 5 MG PO TABS: 10.0000 mg | ORAL_TABLET | ORAL | Status: DC | PRN

## 2023-05-31 MED ORDER — SODIUM CHLORIDE 0.9 % IV SOLN: INTRAVENOUS | Status: DC

## 2023-05-31 MED ORDER — FENTANYL CITRATE (PF) 100 MCG/2ML IJ SOLN
INTRAMUSCULAR | Status: AC | PRN
  Administered 2023-05-31 (×4): 25 ug via INTRAVENOUS

## 2023-05-31 MED ORDER — MIDAZOLAM HCL 2 MG/2ML IJ SOLN
INTRAMUSCULAR | Status: AC
  Filled 2023-05-31: qty 2

## 2023-05-31 MED ORDER — LIDOCAINE HCL (PF) 1 % IJ SOLN
10.0000 mL | Freq: Once | INTRAMUSCULAR | Status: DC
  Filled 2023-05-31: qty 10

## 2023-05-31 MED ORDER — FENTANYL CITRATE (PF) 100 MCG/2ML IJ SOLN
INTRAMUSCULAR | Status: AC
  Filled 2023-05-31: qty 2

## 2023-05-31 MED ORDER — MIDAZOLAM HCL 2 MG/2ML IJ SOLN
INTRAMUSCULAR | Status: AC | PRN
  Administered 2023-05-31: 1 mg via INTRAVENOUS
  Administered 2023-05-31 (×2): .5 mg via INTRAVENOUS

## 2023-05-31 NOTE — H&P (Signed)
Chief Complaint: Patient was seen in consultation today for anterior mediastinal mass  Referring Physician(s): Gudena,Vinay  Supervising Physician: Roanna Banning  Patient Status: [facility] - Out-pt  History of Present Illness: Jessica Simpson is a 52 y.o. female with PMH significant for hypertension and breast cancer in 2012 s/p lumpectomy, chemotherapy and radiation therapy with recurrence in 2019  being seen today in relation to an anterior mediastinal mass. Patient is under the care of Dr Pamelia Hoit from Oncology service. Patient underwent CT CAP on 05/20/23 which revealed metastatic disease within the anterior mediastinum with a large 10.8cm mass with direct sternal body destruction. Patient was subsequently referred to IR for image-guided anterior mediastinal mass biopsy.   Past Medical History:  Diagnosis Date   BRCA1 negative 10/22/2011   BRCA2 negative 10/22/2011   Breast cancer, IDC, Left, Stage III, Triple negative 06/30/2011   Breast disorder    Contraceptive education 01/15/2016   Family history of breast cancer    Fracture    right 4th toe   History of breast cancer 07/22/2014   History of radiation therapy 05/16/18- 06/22/18   Right Breast/ 50.4 Gy in 28 fractions. Right posterior axilla and SCV nodes/ 50.4 Gy in 28 fractions.    Hypertension    Lymphedema 06/15/2012   Lymphedema of arm    Papanicolaou smear of cervix with positive high risk human papilloma virus (HPV) test 12/25/2020   12/19/2020 repeat pap in 1 year per ASCCP guidelines, 5 year CIN3+risk is 2.25%   Personal history of chemotherapy    Personal history of radiation therapy    Positive fecal occult blood test 07/22/2014   S/P radiation therapy 2013   50 gray in 25 fractions to the left breast, supraclavicular, and axillary regions. She then received a boost to the left lumpectomy of 10 gray in 5 fractions. This was given at [facility].   Status post chemotherapy Comp. 11/23/11    FEC and Taxotere    Past Surgical History:  Procedure Laterality Date   anal ascess     turned into a fistula with extensive treatment   AXILLARY LYMPH NODE BIOPSY Right 04/06/2018   AXILLARY LYMPH NODE DISSECTION Right 04/06/2018   Procedure: RIGHT AXILLARY LYMPH NODE DISSECTION;  Surgeon: Emelia Loron, MD;  Location: [facility] OR;  Service: General;  Laterality: Right;   BREAST BIOPSY Left 2012   BREAST BIOPSY Right 2019   BREAST LUMPECTOMY Left 2012   BREAST LUMPECTOMY Right 2019   rt axilla   BREAST SURGERY     CARDIAC ELECTROPHYSIOLOGY MAPPING AND ABLATION     CESAREAN SECTION     COLONOSCOPY N/A 08/14/2014   Procedure: COLONOSCOPY;  Surgeon: Malissa Hippo, MD;  Location: [facility] ENDO SUITE;  Service: Endoscopy;  Laterality: N/A;  930   EVACUATION BREAST HEMATOMA  12/21/2011   Procedure: EVACUATION HEMATOMA BREAST;  Surgeon: Almond Lint, MD;  Location: [facility] OR;  Service: General;  Laterality: Left;   HAND SURGERY     tumor on finger, left hand   INCISION AND DRAINAGE ABSCESS ANAL     x3   left breast biopsy with axillary biopsy     core bx   PORT-A-CATH REMOVAL  12/21/2011   Procedure: REMOVAL PORT-A-CATH;  Surgeon: Currie Paris, MD;  Location: [facility];  Service: General;  Laterality: N/A;   PORTACATH PLACEMENT  08/02/2011   dr Jamey Ripa   PORTACATH PLACEMENT Right 11/14/2017   Procedure: INSERTION PORT-A-CATH WITH [place];  Surgeon:  Emelia Loron, MD;  Location: [facility] OR;  Service: General;  Laterality: Right;   removal breast mass  2004   benign   TREATMENT FISTULA ANAL     TUMOR REMOVAL     on left hand   TUMOR REMOVAL     rt. eye    Allergies: Codeine and Lortab [hydrocodone-acetaminophen]  Medications: Prior to Admission medications   Medication Sig Start Date End Date Taking? Authorizing Provider  acetaminophen (TYLENOL) 500 MG tablet Take 500 mg by mouth every 6 (six) hours as needed. For pain   Yes [provider]  amLODipine  (NORVASC) 5 MG tablet Take 5 mg by mouth daily. 01/05/16  Yes [provider]  Cholecalciferol 5000 units capsule Take 1 capsule (5,000 Units total) by mouth daily. 07/29/16  Yes Cyril Mourning A, NP  ibuprofen (ADVIL) 800 MG tablet Take 1 tablet (800 mg total) by mouth every 8 (eight) hours as needed. 11/24/20  Yes Serena Croissant, MD  loratadine (CLARITIN) 10 MG tablet Take 10 mg by mouth daily. 11/14/21  Yes [provider]  Omega-3 Fatty Acids (FISH OIL MAXIMUM STRENGTH) 1200 MG CPDR Take 1 capsule by mouth daily. 12/09/21  Yes [provider]  vitamin B-12 (CYANOCOBALAMIN) 500 MCG tablet Take 500 mcg by mouth daily.   Yes [provider]  Vitamin D, Ergocalciferol, (DRISDOL) 1.25 MG (50000 UNIT) CAPS capsule Take 50,000 Units by mouth once a week. 01/17/22  Yes [provider]  fluconazole (DIFLUCAN) 150 MG tablet Take 1 now and 1 in 3 days 05/19/22   Adline Potter, NP  traMADol (ULTRAM) 50 MG tablet Take 1 tablet (50 mg total) by mouth every 6 (six) hours as needed. 05/26/23   Serena Croissant, MD     Family History  Problem Relation Age of Onset   Cancer Father 51       lung cancer    Cancer Maternal Grandmother        breast   Cancer Maternal Grandfather    Breast cancer Other        MGMs mother   Colon cancer Neg Hx     Social History   Socioeconomic History   Marital status: Single    Spouse name: Not on file   Number of children: Not on file   Years of education: Not on file   Highest education level: Not on file  Occupational History   Not on file  Tobacco Use   Smoking status: Never   Smokeless tobacco: Never  Vaping Use   Vaping status: Never Used  Substance and Sexual Activity   Alcohol use: No   Drug use: No   Sexual activity: Yes    Birth control/protection: Post-menopausal  Other Topics Concern   Not on file  Social History Narrative   Not on file   Social Determinants of Health   Financial Resource Strain:  Low Risk  (01/26/2022)   Overall Financial Resource Strain (CARDIA)    Difficulty of Paying Living Expenses: Not very hard  Food Insecurity: No Food Insecurity (01/26/2022)   Hunger Vital Sign    Worried About Running Out of Food in the Last Year: Never true    Ran Out of Food in the Last Year: Never true  Transportation Needs: No Transportation Needs (01/26/2022)   PRAPARE - Administrator, Civil Service (Medical): No    Lack of Transportation (Non-Medical): No  Physical Activity: Insufficiently Active (01/26/2022)   Exercise Vital Sign  Days of Exercise per Week: 3 days    Minutes of Exercise per Session: 30 min  Stress: No Stress Concern Present (01/26/2022)   [employer] of Occupational Health - Occupational Stress Questionnaire    Feeling of Stress : Only a little  Social Connections: Moderately Integrated (01/26/2022)   Social Connection and Isolation Panel [NHANES]    Frequency of Communication with Friends and Family: More than three times a week    Frequency of Social Gatherings with Friends and Family: Once a week    Attends Religious Services: More than 4 times per year    Active Member of [employer] or Organizations: Yes    Attends Engineer, structural: More than 4 times per year    Marital Status: Never married    Code Status: Full code  Review of Systems: A 12 point ROS discussed and pertinent positives are indicated in the HPI above.  All other systems are negative.  Review of Systems  Constitutional:  Negative for chills and fever.  Respiratory:  Negative for chest tightness and shortness of breath.   Cardiovascular:  Negative for chest pain and leg swelling.  Gastrointestinal:  Positive for nausea. Negative for abdominal pain, diarrhea and vomiting.  Neurological:  Positive for headaches. Negative for dizziness.  Psychiatric/Behavioral:  Negative for confusion.     Vital Signs: BP (!) 178/95   Pulse 85   Temp 97.6 F (36.4 C) (Temporal)   Resp  16   Ht 5\' 6"  (1.676 m)   Wt 225 lb (102.1 kg)   LMP 09/19/2018   SpO2 96%   BMI 36.32 kg/m     Physical Exam Vitals reviewed.  Constitutional:      General: She is not in acute distress.    Appearance: She is obese.  Eyes:     Pupils: Pupils are equal, round, and reactive to light.  Cardiovascular:     Rate and Rhythm: Normal rate and regular rhythm.     Pulses: Normal pulses.     Heart sounds: Normal heart sounds.  Pulmonary:     Effort: Pulmonary effort is normal.     Breath sounds: Normal breath sounds.  Abdominal:     Palpations: Abdomen is soft.     Tenderness: There is no abdominal tenderness.  Musculoskeletal:     Right lower leg: No edema.     Left lower leg: No edema.  Skin:    General: Skin is warm and dry.  Neurological:     Mental Status: She is alert and oriented to person, place, and time.  Psychiatric:        Mood and Affect: Mood normal.        Behavior: Behavior normal.        Thought Content: Thought content normal.        Judgment: Judgment normal.     Imaging: CT CHEST ABDOMEN PELVIS WO CONTRAST  Result Date: 05/25/2023 CLINICAL DATA:  Left-sided breast cancer. Evaluate for metastatic disease. Left chest pain following pacemaker placement in April. * Tracking Code: BO * EXAM: CT CHEST, ABDOMEN AND PELVIS WITHOUT CONTRAST TECHNIQUE: Multidetector CT imaging of the chest, abdomen and pelvis was performed following the standard protocol without IV contrast. RADIATION DOSE REDUCTION: This exam was performed according to the departmental dose-optimization program which includes automated exposure control, adjustment of the mA and/or kV according to patient size and/or use of iterative reconstruction technique. COMPARISON:  10/12/2017 PET FINDINGS: CT CHEST FINDINGS Cardiovascular: Right atrial/ventricular pacer. Aortic atherosclerosis.  Mild cardiomegaly with trace pericardial thickening. Mediastinum/Nodes: No supraclavicular adenopathy. Bilateral axillary  node dissections. No axillary or subpectoral adenopathy. No middle mediastinal adenopathy. Hilar regions poorly evaluated without intravenous contrast. A dominant anterior mediastinal soft tissue mass measures 10.8 x 6.7 cm on 18/2 and causes sternal body destruction on 21/2. More caudal, separate juxta cardiac soft tissue nodules including at up to 4.2 x 1.9 cm on 38/2. Lungs/Pleura: No pleural fluid. Pleural-based left lower lobe pulmonary nodule of 8 mm on 109/4 is new since the remote PET. Musculoskeletal: Sternal body involvement by anterior mediastinal tumor as detailed above (124/6). CT ABDOMEN PELVIS FINDINGS Hepatobiliary: No noncontrast evidence of focal liver abnormality. Focal steatosis adjacent the falciform ligament. Normal gallbladder, without biliary ductal dilatation. Pancreas: Normal, without mass or ductal dilatation. Spleen: Normal in size, without focal abnormality. Adrenals/Urinary Tract: Normal adrenal glands. No renal calculi or hydronephrosis. No bladder calculi. Stomach/Bowel: Normal stomach, without wall thickening. Colonic stool burden suggests constipation. Normal terminal ileum and appendix. Normal small bowel. Vascular/Lymphatic: Aortic atherosclerosis. No abdominopelvic adenopathy. Reproductive: Normal uterus and adnexa. Other: No significant free fluid. Mild pelvic floor laxity. No evidence of omental or peritoneal disease. Musculoskeletal: No acute osseous abnormality. IMPRESSION: 1. Metastatic disease within the anterior mediastinum, including a dominant 10.8 cm mass with direct sternal body destruction. 2. Isolated pleural-based left lower lobe pulmonary nodule could represent a parenchymal or a pleural metastasis. 3. No subdiaphragmatic metastatic disease identified. 4. Incidental findings, including: Aortic Atherosclerosis (ICD10-I70.0). Possible constipation. Electronically Signed   By: Jeronimo Greaves M.D.   On: 05/25/2023 12:29    Labs:  CBC: Recent Labs    05/31/23 0831   WBC 5.1  HGB 11.6*  HCT 36.2  PLT 255    COAGS: Recent Labs    05/31/23 0831  INR 1.0    BMP: No results for input(s): "NA", "K", "CL", "CO2", "GLUCOSE", "BUN", "CALCIUM", "CREATININE", "GFRNONAA", "GFRAA" in the last 8760 hours.  Invalid input(s): "CMP"  LIVER FUNCTION TESTS: No results for input(s): "BILITOT", "AST", "ALT", "ALKPHOS", "PROT", "ALBUMIN" in the last 8760 hours.  TUMOR MARKERS: No results for input(s): "AFPTM", "CEA", "CA199", "CHROMGRNA" in the last 8760 hours.  Assessment and Plan:  Jessica Simpson is a 52 yo female with PMH significant for breast cancer being seen today in relation to newly discovered anterior mediastinal mass concerning for metastatic disease. Patient's imaging and case were approved by Dr Deanne Coffer. Case reviewed with Dr Milford Cage. Patient presents today in her usual state of health and is NPO.  Risks and benefits of image-guided anterior mediastinal mass biopssy was discussed with the patient and/or patient's family including, but not limited to bleeding, infection, damage to adjacent structures or low yield requiring additional tests.  All of the questions were answered and there is agreement to proceed.  Consent signed and in chart.   Thank you for this interesting consult.  I greatly enjoyed meeting Jessica Simpson and look forward to participating in their care.  A copy of this report was sent to the requesting provider on this date.  Electronically Signed: Kennieth Francois, PA-C 05/31/2023, 9:20 AM   I spent a total of 15 Minutes   in face to face in clinical consultation, greater than 50% of which was counseling/coordinating care for anterior mediastinal mass.

## 2023-05-31 NOTE — Procedures (Signed)
Vascular and Interventional Radiology Procedure Note  Patient: Jessica Simpson DOB: 1971-02-03 Medical Record Number: [id number] Note Date/Time: 05/31/23 10:15 AM   Performing Physician: Roanna Banning, MD Assistant(s): None  Diagnosis: Hx breast CA. Anterior mediastinal mass   Procedure: ANTERIOR MEDIASTINAL MASS BIOPSY  Anesthesia: Conscious Sedation Complications: None Estimated Blood Loss: Minimal Specimens: Sent for Pathology  Findings:  Successful CT-guided biopsy of an aterior mediastinal mass A total of 3 samples were obtained. Hemostasis of the tract was achieved using Manual Pressure.  Plan: Bed rest for 1 hours. Post op CXR  See detailed procedure note with images in PACS. The patient tolerated the procedure well without incident or complication and was returned to Recovery in stable condition.    Roanna Banning, MD [facility]   Pager. [phone] Clinic. [phone]

## 2023-06-01 ENCOUNTER — Telehealth: Payer: Self-pay

## 2023-06-01 ENCOUNTER — Telehealth: Payer: Self-pay | Admitting: Hematology and Oncology

## 2023-06-01 NOTE — Telephone Encounter (Signed)
Message sent to [facility] 3 scheduler for pt f/u

## 2023-06-01 NOTE — Telephone Encounter (Signed)
-----   Message from Tamsen Meek sent at 05/31/2023  3:47 PM EDT ----- Regarding: Please arrange follow-up next week She had a biopsy done. Lets see if we can get a follow-up with me to discuss the pathology report. ----- Message ----- From: Interface, Rad Results In Sent: 05/31/2023   1:44 PM EDT To: Serena Croissant, MD

## 2023-06-01 NOTE — Telephone Encounter (Signed)
Patient is aware of scheduled appointment times/dates

## 2023-06-02 ENCOUNTER — Ambulatory Visit (HOSPITAL_COMMUNITY)
Admission: RE | Admit: 2023-06-02 | Discharge: 2023-06-02 | Disposition: A | Discharge status: Home / Self Care | Payer: 59 | Source: Ambulatory Visit | Attending: Hematology and Oncology | Admitting: Hematology and Oncology

## 2023-06-02 DIAGNOSIS — C50012 Malignant neoplasm of nipple and areola, left female breast: Secondary | ICD-10-CM | POA: Insufficient documentation

## 2023-06-02 LAB — GLUCOSE, CAPILLARY: Glucose-Capillary: 112 mg/dL — ABNORMAL HIGH (ref 70–99)

## 2023-06-02 MED ORDER — FLUDEOXYGLUCOSE F - 18 (FDG) INJECTION
11.2000 | Freq: Once | INTRAVENOUS | Status: AC
  Administered 2023-06-02: 11.2 via INTRAVENOUS

## 2023-06-02 NOTE — Progress Notes (Signed)
MD requesting Caris testing and breast prognostic panel on 05/31/23 pathology. [facility] Testing requisition with required paperwork faxed to [phone] with receipt confirmation. Spoke with [facility] pathology who stated breast prognostics would be ordered and run.

## 2023-06-06 ENCOUNTER — Telehealth: Payer: Self-pay | Admitting: Pharmacy Technician

## 2023-06-06 ENCOUNTER — Other Ambulatory Visit (HOSPITAL_COMMUNITY): Payer: Self-pay

## 2023-06-06 ENCOUNTER — Inpatient Hospital Stay: Payer: 59 | Attending: Hematology and Oncology | Admitting: Hematology and Oncology

## 2023-06-06 ENCOUNTER — Other Ambulatory Visit: Payer: Self-pay

## 2023-06-06 VITALS — BP 168/69 | HR 65 | Temp 97.5°F | Resp 17 | Wt 223.5 lb

## 2023-06-06 DIAGNOSIS — C50012 Malignant neoplasm of nipple and areola, left female breast: Secondary | ICD-10-CM | POA: Diagnosis not present

## 2023-06-06 DIAGNOSIS — R42 Dizziness and giddiness: Secondary | ICD-10-CM | POA: Insufficient documentation

## 2023-06-06 DIAGNOSIS — I890 Lymphedema, not elsewhere classified: Secondary | ICD-10-CM | POA: Diagnosis not present

## 2023-06-06 DIAGNOSIS — Z853 Personal history of malignant neoplasm of breast: Secondary | ICD-10-CM | POA: Diagnosis present

## 2023-06-06 DIAGNOSIS — Z9221 Personal history of antineoplastic chemotherapy: Secondary | ICD-10-CM | POA: Diagnosis not present

## 2023-06-06 DIAGNOSIS — Z923 Personal history of irradiation: Secondary | ICD-10-CM | POA: Insufficient documentation

## 2023-06-06 DIAGNOSIS — Z950 Presence of cardiac pacemaker: Secondary | ICD-10-CM | POA: Diagnosis not present

## 2023-06-06 DIAGNOSIS — R5383 Other fatigue: Secondary | ICD-10-CM | POA: Insufficient documentation

## 2023-06-06 DIAGNOSIS — C781 Secondary malignant neoplasm of mediastinum: Secondary | ICD-10-CM | POA: Insufficient documentation

## 2023-06-06 MED ORDER — ABEMACICLIB 100 MG PO TABS
100.0000 mg | ORAL_TABLET | Freq: Two times a day (BID) | ORAL | 0 refills | Status: DC
  Filled 2023-06-06: qty 70, 35d supply, fill #0
  Filled 2023-06-09: qty 56, 28d supply, fill #0
  Filled ????-??-??: fill #1

## 2023-06-06 MED ORDER — LETROZOLE 2.5 MG PO TABS: 2.5000 mg | ORAL_TABLET | Freq: Every day | ORAL | 3 refills | Status: DC

## 2023-06-06 NOTE — Progress Notes (Signed)
Patient Care Team: Arlina Robes, MD as PCP - General (Family Medicine) Cyndia Bent, MD (Inactive) as Surgeon (General Surgery) Glenford Peers, MD (Hematology and Oncology)  DIAGNOSIS:  Encounter Diagnosis  Name Primary?   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33 T1CN1 stage IIb   12/31/2011 - 02/15/2012 Radiation Therapy   Radiation at [facility]   09/26/2017 Relapse/Recurrence   Left breast upper outer quadrant within the lumpectomy bed: Fibrosis no malignancy, right axillary lymph node biopsy metastatic high-grade carcinoma ER 0%, PR 0%, Ki-67 90%, HER-2 negative ratio 1.11 (similar to previous ductal carcinoma)   11/14/2017 - 03/06/2018 Neo-Adjuvant Chemotherapy   Neo-Adjuvant chemotherapy with Gemzar and carboplatin days 1 and 8 every 3 weeks   01/10/2018 Genetic Testing   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  The Hereditary Gene Panel offered by [facility] includes sequencing and/or deletion duplication testing of the following 47 genes: APC, ATM, AXIN2, BARD1, BMPR1A, BRCA1, BRCA2, BRIP1, CDH1, CDK4, CDKN2A (p14ARF), CDKN2A (p16INK4a), CHEK2, CTNNA1, DICER1, EPCAM (Deletion/duplication testing only), GREM1 (promoter region deletion/duplication testing only), KIT, MEN1, MLH1, MSH2, MSH3, MSH6, MUTYH, NBN, NF1, NHTL1, PALB2,  PDGFRA, PMS2, POLD1, POLE, PTEN, RAD50, RAD51C, RAD51D, SDHB, SDHC, SDHD, SMAD4, SMARCA4. STK11, TP53, TSC1, TSC2, and VHL.  The following genes were evaluated for sequence changes only: SDHA and HOXB13 c.251G>A variant only. The report date is January 10, 2018.    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 with above-mentioned history of triple negative left breast cancer. She presents to the clinic today for a follow-up. Patient reports that she is already having some loose stools.   ALLERGIES:  is allergic to codeine and lortab [hydrocodone-acetaminophen].  MEDICATIONS:  Current Outpatient Medications  Medication Sig Dispense Refill   acetaminophen (TYLENOL) 500 MG tablet Take 500 mg by mouth every 6 (six) hours as needed. For pain     amLODipine (NORVASC) 5 MG tablet Take 5 mg by mouth daily.  6   Cholecalciferol 5000 units capsule Take 1 capsule (5,000 Units total) by mouth daily.     fluconazole (DIFLUCAN) 150 MG tablet Take 1 now and 1 in 3 days 2 tablet 1   ibuprofen (ADVIL)  800 MG tablet Take 1 tablet (800 mg total) by mouth every 8 (eight) hours as needed. 30 tablet 0   loratadine (CLARITIN) 10 MG tablet Take 10 mg by mouth daily.     Omega-3 Fatty Acids (FISH OIL MAXIMUM STRENGTH) 1200 MG CPDR Take 1 capsule by mouth  daily.     traMADol (ULTRAM) 50 MG tablet Take 1 tablet (50 mg total) by mouth every 6 (six) hours as needed. 30 tablet 0   vitamin B-12 (CYANOCOBALAMIN) 500 MCG tablet Take 500 mcg by mouth daily.     Vitamin D, Ergocalciferol, (DRISDOL) 1.25 MG (50000 UNIT) CAPS capsule Take 50,000 Units by mouth once a week.     No current facility-administered medications for this visit.   Facility-Administered Medications Ordered in Other Visits  Medication Dose Route Frequency Provider Last Rate Last Admin   lactated ringers infusion    Continuous PRN Andree Elk, CRNA   New Bag at 11/14/17 0700    PHYSICAL EXAMINATION: ECOG PERFORMANCE STATUS: {CHL ONC ECOG XB:[id number]}  Vitals:   06/06/23 1531  BP: (!) 168/69  Pulse: 65  Resp: 17  Temp: (!) 97.5 F (36.4 C)  SpO2: 100%   Filed Weights   06/06/23 1531  Weight: 223 lb 8 oz (101.4 kg)    BREAST:*** No palpable masses or nodules in either right or left breasts. No palpable axillary supraclavicular or infraclavicular adenopathy no breast tenderness or nipple discharge. (exam performed in the presence of a chaperone)  LABORATORY DATA:  I have reviewed the data as listed    Latest Ref Rng & Units 12/21/2018    2:02 PM 10/24/2018   10:53 AM 10/03/2018   10:07 AM  CMP  Glucose 70 - 99 mg/dL 78  92  027   BUN 6 - 20 mg/dL 10  10  12    Creatinine 0.44 - 1.00 mg/dL 2.53  6.64  4.03   Sodium 135 - 145 mmol/L 140  140  141   Potassium 3.5 - 5.1 mmol/L 3.6  4.0  3.5   Chloride 98 - 111 mmol/L 104  104  106   CO2 22 - 32 mmol/L 25  26  27    Calcium 8.9 - 10.3 mg/dL 9.5  9.5  9.6   Total Protein 6.5 - 8.1 g/dL 7.7  7.3  7.3   Total Bilirubin 0.3 - 1.2 mg/dL 0.4  0.3  0.3   Alkaline Phos 38 - 126 U/L 128  119  109   AST 15 - 41 U/L 15  14  17    ALT 0 - 44 U/L 14  14  20      Lab Results  Component Value Date   WBC 5.1 05/31/2023   HGB 11.6 (L) 05/31/2023   HCT 36.2 05/31/2023   MCV 82.5 05/31/2023   PLT 255 05/31/2023    NEUTROABS 3.7 12/21/2018    ASSESSMENT & PLAN:  Cancer of left breast [facility]) Prior left breast cancer 2012: Treated with neoadjuvant FEC-Taxotere status post lumpectomy radiation. Recurrence: Right axillary lymph node. Neoadjuvant chemo carboplatin and gemcitabine x6 cycles Right axillary lymph node dissection 1/11 lymph nodes positive (triple negative) Adjuvant radiation with Xeloda completed 06/19/18 Completed 6 cycles of CMF adjuvant chemo 10/24/2018   Episodes of dizziness followed by severe fatigue: She is following with [facility]ology at [facility].  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   Lymphedema of upper extremities:  Pain between shoulder blades:  PET/CT 06/02/2023: Anterior mediastinal mass is hypermetabolic and centrally necrotic, soft  tissue lesions anterior to the heart, 10 mm pleural-based lesion, level 2 cervical nodes, hypermetabolism hepatic dome mottled FDG accumulation throughout the skeletal system  05/31/2023: Mediastinal mass biopsy: Metastatic poorly differentiated carcinoma compatible with breast primary ER +60%, PR 0%, HER2 0, Ki-67 40%  Treatment plan: Antiestrogen therapy with Verzenio and letrozole Abemaciclib counseling: I discussed at length the risks and benefits of Abemaciclib in combination with letrozole. Adverse effects of Abemaciclib include decreasing neutrophil count, pneumonia, blood clots in lungs as well as nausea and GI symptoms. Side effects of letrozole include hot flashes, muscle aches and pains, uterine bleeding/spotting/cancer, osteoporosis, risk of blood clots.  Return to clinic to follow-up with Jonny Ruiz and toxicity check   No orders of the defined types were placed in this encounter.  The patient has a good understanding of the overall plan. she agrees with it. she will call with any problems that may develop before the next visit here. Total time spent: 30 mins including face to face time and time spent for planning, charting and co-ordination of care    Sherlyn Lick, CMA 06/06/23    I Janan Ridge am acting as a Neurosurgeon for [employer]  ***

## 2023-06-06 NOTE — Assessment & Plan Note (Addendum)
Prior left breast cancer 2012: Treated with neoadjuvant FEC-Taxotere status post lumpectomy radiation. Recurrence: Right axillary lymph node. Neoadjuvant chemo carboplatin and gemcitabine x6 cycles Right axillary lymph node dissection 1/11 lymph nodes positive (triple negative) Adjuvant radiation with Xeloda completed 06/19/18 Completed 6 cycles of CMF adjuvant chemo 10/24/2018   Episodes of dizziness followed by severe fatigue: She is following with cardiology at [facility].  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   Lymphedema of upper extremities:  Pain between shoulder blades:  PET/CT 06/02/2023: Anterior mediastinal mass is hypermetabolic and centrally necrotic, soft tissue lesions anterior to the heart, 10 mm pleural-based lesion, level 2 cervical nodes, hypermetabolism hepatic dome mottled FDG accumulation throughout the skeletal system  05/31/2023: Mediastinal mass biopsy: Metastatic poorly differentiated carcinoma compatible with breast primary ER +60%, PR 0%, HER2 0, Ki-67 40%  Treatment plan: Antiestrogen therapy with Verzenio and letrozole Abemaciclib counseling: I discussed at length the risks and benefits of Abemaciclib in combination with letrozole. Adverse effects of Abemaciclib include decreasing neutrophil count, pneumonia, blood clots in lungs as well as nausea and GI symptoms. Side effects of letrozole include hot flashes, muscle aches and pains, uterine bleeding/spotting/cancer, osteoporosis, risk of blood clots.  Return to clinic to follow-up with Jonny Ruiz and toxicity check

## 2023-06-06 NOTE — Telephone Encounter (Signed)
Oral Oncology Patient Advocate Encounter   Received notification that prior authorization for Verzenio is required.   PA submitted on 06/06/23 Key [id number] Status is pending     Jinger Neighbors, CPhT-Adv Oncology Pharmacy Patient Advocate [facility] Direct Number: [phone]  Fax: [phone]

## 2023-06-07 ENCOUNTER — Telehealth: Payer: Self-pay | Admitting: Hematology and Oncology

## 2023-06-07 ENCOUNTER — Telehealth: Payer: Self-pay

## 2023-06-07 ENCOUNTER — Other Ambulatory Visit (HOSPITAL_COMMUNITY): Payer: Self-pay

## 2023-06-07 NOTE — Telephone Encounter (Signed)
Oral Oncology Pharmacist Encounter  Received new prescription for verzenio (abemaciclib) for the treatment of ER positive, HER2 negative metastatic breast cancer in conjunction with letrozole, planned duration until disease progression or unacceptable toxicity.  Labs from [facility] (05/31/23) assessed, no interventions needed. No CMP since 2020, message sent to RN regarding need for new labs drawn. Prescription dose and frequency assessed for appropriateness.   Current medication list in Epic reviewed, no significant/ relevant DDIs with Verzenio identified. Patient is not on fluconazole at this time as this medication was from 2023.   Evaluated chart and no patient barriers to medication adherence noted.   Patient agreement for treatment documented in MD note on 06/06/2023.  Prescription has been e-scribed to the [facility] for benefits analysis and approval.  Oral Oncology Clinic will continue to follow for insurance authorization, copayment issues, initial counseling and start date.  Bethel Born, PharmD Hematology/Oncology Clinical Pharmacist [name] Oral Chemotherapy Navigation Clinic [phone] 06/07/2023 8:09 AM

## 2023-06-07 NOTE — Telephone Encounter (Signed)
Scheduled appointment per 8/12 los. Patient is aware of the made appointment.

## 2023-06-07 NOTE — Telephone Encounter (Signed)
Per MD pt is to start Verzenio. She will need to come in for labs. She was offered lab appt for today but states she will be in [facility] tomorrow. She accepted lab appt for 1145 tomorrow at [facility].

## 2023-06-07 NOTE — Telephone Encounter (Signed)
Oral Oncology Patient Advocate Encounter  Prior Authorization for Kathlen Mody has been approved.    PA# [id number] Effective dates: 06/06/23 through 10/25/23  Patients co-pay is $0.    Jinger Neighbors, CPhT-Adv Oncology Pharmacy Patient Advocate [facility] Direct Number: [phone]  Fax: [phone]

## 2023-06-08 ENCOUNTER — Inpatient Hospital Stay: Payer: 59

## 2023-06-08 ENCOUNTER — Other Ambulatory Visit: Payer: Self-pay

## 2023-06-08 DIAGNOSIS — C781 Secondary malignant neoplasm of mediastinum: Secondary | ICD-10-CM | POA: Diagnosis not present

## 2023-06-08 DIAGNOSIS — C50012 Malignant neoplasm of nipple and areola, left female breast: Secondary | ICD-10-CM

## 2023-06-08 LAB — CBC WITH DIFFERENTIAL (CANCER CENTER ONLY)
Abs Immature Granulocytes: 0.02 10*3/uL (ref 0.00–0.07)
Basophils Absolute: 0 10*3/uL (ref 0.0–0.1)
Basophils Relative: 1 %
Eosinophils Absolute: 0 10*3/uL (ref 0.0–0.5)
Eosinophils Relative: 1 %
HCT: 37.6 % (ref 36.0–46.0)
Hemoglobin: 12.1 g/dL (ref 12.0–15.0)
Immature Granulocytes: 1 %
Lymphocytes Relative: 30 %
Lymphs Abs: 1.3 10*3/uL (ref 0.7–4.0)
MCH: 26.6 pg (ref 26.0–34.0)
MCHC: 32.2 g/dL (ref 30.0–36.0)
MCV: 82.6 fL (ref 80.0–100.0)
Monocytes Absolute: 0.3 10*3/uL (ref 0.1–1.0)
Monocytes Relative: 7 %
Neutro Abs: 2.6 10*3/uL (ref 1.7–7.7)
Neutrophils Relative %: 60 %
Platelet Count: 257 10*3/uL (ref 150–400)
RBC: 4.55 MIL/uL (ref 3.87–5.11)
RDW: 14.4 % (ref 11.5–15.5)
WBC Count: 4.3 10*3/uL (ref 4.0–10.5)
nRBC: 0 % (ref 0.0–0.2)

## 2023-06-08 LAB — CMP (CANCER CENTER ONLY)
ALT: 13 U/L (ref 0–44)
AST: 14 U/L — ABNORMAL LOW (ref 15–41)
Albumin: 4.4 g/dL (ref 3.5–5.0)
Alkaline Phosphatase: 111 U/L (ref 38–126)
Anion gap: 6 (ref 5–15)
BUN: 14 mg/dL (ref 6–20)
CO2: 33 mmol/L — ABNORMAL HIGH (ref 22–32)
Calcium: 9.6 mg/dL (ref 8.9–10.3)
Chloride: 100 mmol/L (ref 98–111)
Creatinine: 0.92 mg/dL (ref 0.44–1.00)
GFR, Estimated: >60 mL/min (ref >60)
Glucose, Bld: 98 mg/dL (ref 70–99)
Potassium: 3.4 mmol/L — ABNORMAL LOW (ref 3.5–5.1)
Sodium: 139 mmol/L (ref 135–145)
Total Bilirubin: 0.5 mg/dL (ref 0.3–1.2)
Total Protein: 8.1 g/dL (ref 6.5–8.1)

## 2023-06-09 ENCOUNTER — Other Ambulatory Visit: Payer: Self-pay

## 2023-06-09 ENCOUNTER — Other Ambulatory Visit (HOSPITAL_COMMUNITY): Payer: Self-pay

## 2023-06-09 NOTE — Telephone Encounter (Signed)
Oral Chemotherapy Pharmacist Encounter  I spoke with patient for overview of: Verzenio for the treatment of  hormone-receptor positive breast cancer, in combination with letrozole, planned duration until disease progression or unacceptable toxicity.   Counseled patient on administration, dosing, side effects, monitoring, drug-food interactions, safe handling, storage, and disposal.  Patient will take Verzenio 100mg  tablets, 1 tablet by mouth twice daily without regard to food.  Patient knows to avoid grapefruit and grapefruit juice.  Verzenio start date: 06/14/2023 Patient gets back from being out of town on Sunday and patient will get prescription delivered once she returns.   Adverse effects include but are not limited to: diarrhea, fatigue, nausea, abdominal pain, decreased blood counts, and increased liver function tests, and joint pains. Severe, life-threatening, and/or fatal interstitial lung disease (ILD) and/or pneumonitis may occur with CDK 4/6 inhibitors.  Patient has anti-emetic on hand and knows to take it if nausea develops.   Patient will obtain anti diarrheal and alert the office of 4 or more loose stools above baseline.  Reviewed with patient importance of keeping a medication schedule and plan for any missed doses. No barriers to medication adherence identified.  Medication reconciliation performed and medication/allergy list updated.  Insurance authorization for [employer] has been obtained. Test claim at the pharmacy revealed copayment $0 for 1st fill of 28 days. This will ship from the [facility] outpatient pharmacy on 06/13/23 to deliver to patient's home on 06/14/2023.  Patient informed the pharmacy will reach out 5-7 days prior to needing next fill of Verzenio to coordinate continued medication acquisition to prevent break in therapy.  All questions answered.  Patient voiced understanding and appreciation.   Medication education handout placed in mail for patient.  Patient knows to call the office with questions or concerns. Oral Chemotherapy Clinic phone number provided to patient.   Bethel Born, PharmD Hematology/Oncology Clinical Pharmacist [facility] Oral Chemotherapy Navigation Clinic [phone] 06/09/2023   2:31 PM

## 2023-06-10 ENCOUNTER — Other Ambulatory Visit (HOSPITAL_COMMUNITY): Payer: Self-pay

## 2023-06-13 ENCOUNTER — Telehealth: Payer: Self-pay

## 2023-06-13 NOTE — Telephone Encounter (Signed)
Pt is scheduled for CT CAP 07/07/23 with a 1015 arrival to [facility] rad. She is in agreement with this and knows her MD f/u is 9/19.

## 2023-06-21 ENCOUNTER — Encounter: Payer: Self-pay | Admitting: Hematology and Oncology

## 2023-06-29 ENCOUNTER — Other Ambulatory Visit: Payer: Self-pay

## 2023-06-29 ENCOUNTER — Other Ambulatory Visit: Payer: Self-pay | Admitting: Hematology and Oncology

## 2023-06-29 ENCOUNTER — Telehealth: Payer: Self-pay

## 2023-06-29 MED ORDER — ONDANSETRON HCL 8 MG PO TABS: 8.0000 mg | ORAL_TABLET | Freq: Three times a day (TID) | ORAL | 0 refills | Status: DC | PRN

## 2023-06-29 MED ORDER — DIPHENOXYLATE-ATROPINE 2.5-0.025 MG PO TABS: 1.0000 | ORAL_TABLET | Freq: Four times a day (QID) | ORAL | 1 refills | Status: DC | PRN

## 2023-06-29 MED ORDER — PROCHLORPERAZINE MALEATE 5 MG PO TABS: 5.0000 mg | ORAL_TABLET | Freq: Four times a day (QID) | ORAL | 0 refills | Status: DC | PRN

## 2023-06-29 NOTE — Telephone Encounter (Signed)
Oral Oncology Pharmacist Encounter  Received notification from outpatient specialty pharmacy that patient is having difficulty with side effects from [facility]. Patient is having nausea/ vomiting as well as diarrhea which is not controlled by Imodium.   Patient states that she is having 4-5 loose stools with the imodium and that it also makes the nausea worse.   Reached out to MD and RN to have zofran, compazine, and lomotil to help control these side effects. Prescriptions were sent by above and patient greatly appreciated. Discussed with patient on how to take these medications and how she can alternate the nausea medications to help control the nausea/ vomiting.  I asked outpatient specialty pharmacist to please check with the patient tomorrow to make sure she is starting to feel better.   Bethel Born, PharmD Hematology/Oncology Clinical Pharmacist [facility] Oral Chemotherapy Navigation Clinic [phone]

## 2023-06-30 ENCOUNTER — Other Ambulatory Visit: Payer: Self-pay

## 2023-06-30 ENCOUNTER — Other Ambulatory Visit (HOSPITAL_COMMUNITY): Payer: Self-pay

## 2023-07-04 ENCOUNTER — Other Ambulatory Visit: Payer: Self-pay | Admitting: *Deleted

## 2023-07-06 ENCOUNTER — Other Ambulatory Visit (HOSPITAL_COMMUNITY): Payer: Self-pay

## 2023-07-07 ENCOUNTER — Ambulatory Visit (HOSPITAL_COMMUNITY)
Admission: RE | Admit: 2023-07-07 | Discharge: 2023-07-07 | Disposition: A | Discharge status: Home / Self Care | Payer: 59 | Source: Ambulatory Visit | Attending: Hematology and Oncology | Admitting: Hematology and Oncology

## 2023-07-07 ENCOUNTER — Inpatient Hospital Stay: Payer: 59 | Attending: Hematology and Oncology

## 2023-07-07 DIAGNOSIS — Z79899 Other long term (current) drug therapy: Secondary | ICD-10-CM | POA: Diagnosis not present

## 2023-07-07 DIAGNOSIS — Z9221 Personal history of antineoplastic chemotherapy: Secondary | ICD-10-CM | POA: Insufficient documentation

## 2023-07-07 DIAGNOSIS — C781 Secondary malignant neoplasm of mediastinum: Secondary | ICD-10-CM | POA: Insufficient documentation

## 2023-07-07 DIAGNOSIS — C50012 Malignant neoplasm of nipple and areola, left female breast: Secondary | ICD-10-CM | POA: Insufficient documentation

## 2023-07-07 DIAGNOSIS — Z79811 Long term (current) use of aromatase inhibitors: Secondary | ICD-10-CM | POA: Diagnosis not present

## 2023-07-07 DIAGNOSIS — C773 Secondary and unspecified malignant neoplasm of axilla and upper limb lymph nodes: Secondary | ICD-10-CM | POA: Diagnosis not present

## 2023-07-07 DIAGNOSIS — Z923 Personal history of irradiation: Secondary | ICD-10-CM | POA: Insufficient documentation

## 2023-07-07 LAB — CMP (CANCER CENTER ONLY)
ALT: 17 U/L (ref 0–44)
AST: 14 U/L — ABNORMAL LOW (ref 15–41)
Albumin: 4 g/dL (ref 3.5–5.0)
Alkaline Phosphatase: 81 U/L (ref 38–126)
Anion gap: 7 (ref 5–15)
BUN: 19 mg/dL (ref 6–20)
CO2: 30 mmol/L (ref 22–32)
Calcium: 9.5 mg/dL (ref 8.9–10.3)
Chloride: 101 mmol/L (ref 98–111)
Creatinine: 1.39 mg/dL — ABNORMAL HIGH (ref 0.44–1.00)
GFR, Estimated: 46 mL/min — ABNORMAL LOW (ref >60)
Glucose, Bld: 87 mg/dL (ref 70–99)
Potassium: 3.3 mmol/L — ABNORMAL LOW (ref 3.5–5.1)
Sodium: 138 mmol/L (ref 135–145)
Total Bilirubin: 0.4 mg/dL (ref 0.3–1.2)
Total Protein: 7.5 g/dL (ref 6.5–8.1)

## 2023-07-07 LAB — CBC WITH DIFFERENTIAL (CANCER CENTER ONLY)
Abs Immature Granulocytes: 0.03 10*3/uL (ref 0.00–0.07)
Basophils Absolute: 0 10*3/uL (ref 0.0–0.1)
Basophils Relative: 1 %
Eosinophils Absolute: 0 10*3/uL (ref 0.0–0.5)
Eosinophils Relative: 1 %
HCT: 30.7 % — ABNORMAL LOW (ref 36.0–46.0)
Hemoglobin: 10.3 g/dL — ABNORMAL LOW (ref 12.0–15.0)
Immature Granulocytes: 1 %
Lymphocytes Relative: 48 %
Lymphs Abs: 1.1 10*3/uL (ref 0.7–4.0)
MCH: 27.9 pg (ref 26.0–34.0)
MCHC: 33.6 g/dL (ref 30.0–36.0)
MCV: 83.2 fL (ref 80.0–100.0)
Monocytes Absolute: 0.1 10*3/uL (ref 0.1–1.0)
Monocytes Relative: 5 %
Neutro Abs: 1 10*3/uL — ABNORMAL LOW (ref 1.7–7.7)
Neutrophils Relative %: 44 %
Platelet Count: 123 10*3/uL — ABNORMAL LOW (ref 150–400)
RBC: 3.69 MIL/uL — ABNORMAL LOW (ref 3.87–5.11)
RDW: 14.3 % (ref 11.5–15.5)
WBC Count: 2.2 10*3/uL — ABNORMAL LOW (ref 4.0–10.5)
nRBC: 0 % (ref 0.0–0.2)

## 2023-07-07 MED ORDER — SODIUM CHLORIDE (PF) 0.9 % IJ SOLN
INTRAMUSCULAR | Status: AC
  Filled 2023-07-07: qty 50

## 2023-07-07 MED ORDER — IOHEXOL 300 MG/ML  SOLN
100.0000 mL | Freq: Once | INTRAMUSCULAR | Status: AC | PRN
  Administered 2023-07-07: 100 mL via INTRAVENOUS

## 2023-07-12 ENCOUNTER — Other Ambulatory Visit: Payer: Self-pay | Admitting: Hematology and Oncology

## 2023-07-12 ENCOUNTER — Other Ambulatory Visit (HOSPITAL_COMMUNITY): Payer: Self-pay

## 2023-07-12 ENCOUNTER — Other Ambulatory Visit: Payer: Self-pay

## 2023-07-13 ENCOUNTER — Other Ambulatory Visit: Payer: Self-pay

## 2023-07-13 ENCOUNTER — Other Ambulatory Visit: Payer: Self-pay | Admitting: *Deleted

## 2023-07-14 ENCOUNTER — Other Ambulatory Visit: Payer: Self-pay

## 2023-07-14 ENCOUNTER — Encounter: Payer: Self-pay | Admitting: Hematology and Oncology

## 2023-07-14 ENCOUNTER — Other Ambulatory Visit: Payer: Self-pay | Admitting: Hematology and Oncology

## 2023-07-14 ENCOUNTER — Other Ambulatory Visit: Payer: Self-pay | Admitting: General Surgery

## 2023-07-14 ENCOUNTER — Inpatient Hospital Stay (HOSPITAL_BASED_OUTPATIENT_CLINIC_OR_DEPARTMENT_OTHER): Payer: 59 | Admitting: Hematology and Oncology

## 2023-07-14 VITALS — BP 154/82 | HR 114 | Temp 97.8°F | Resp 18 | Ht 66.0 in | Wt 219.1 lb

## 2023-07-14 DIAGNOSIS — N6331 Unspecified lump in axillary tail of the right breast: Secondary | ICD-10-CM | POA: Diagnosis not present

## 2023-07-14 DIAGNOSIS — C50012 Malignant neoplasm of nipple and areola, left female breast: Secondary | ICD-10-CM | POA: Diagnosis not present

## 2023-07-14 MED ORDER — ONDANSETRON HCL 8 MG PO TABS: 8.0000 mg | ORAL_TABLET | Freq: Three times a day (TID) | ORAL | 1 refills | Status: DC | PRN

## 2023-07-14 MED ORDER — LIDOCAINE-PRILOCAINE 2.5-2.5 % EX CREA: TOPICAL_CREAM | CUTANEOUS | 3 refills | Status: DC

## 2023-07-14 MED ORDER — PROCHLORPERAZINE MALEATE 10 MG PO TABS: 10.0000 mg | ORAL_TABLET | Freq: Four times a day (QID) | ORAL | 1 refills | Status: DC | PRN

## 2023-07-14 NOTE — Assessment & Plan Note (Signed)
Current treatment: Verzinio with letrozole started 06/08/2023 CT CAP: Increasing mediastinal mass 11.6 cm (used to be 10.8 cm) effacing the heart and involving the sternum, additional mediastinal/pericardial lymph nodes increased right extra lymph node 1.5 cm, nodules 0.2 cm (used to be 0.8 cm), few new scattered lung nodules)  Discussion: I am very concerned about the progression of disease especially effacing the heart and involving the sternum.  Treatment plan: Radiation oncology consultation to see if palliative radiation can be done. Change treatment to Trodelvy day 1 day 8 every 3 weeks  Sacituzumab-Govitecan counseling: I discussed with the patient that this drug is an antibody drug conjugate. Its toxicities are related to neutropenia and diarrhea and hypersensitivity reactions primarily.  The range of adverse effects can be quite extensive including's edema, skin reactions, anemia, elevation of LFTs, dizziness fatigue joint pains and dyspnea.  Diarrhea can be quite prevalent and up to 62% of patients.  Hypersensitivity reactions and 37%.  Grade 3 and 4 neutropenia and 26% of patients.  Phase 2 study showed one third of patients who got this drug had a response that lasted an average of 7.7 months.  Return to clinic in 1 week to start treatment.

## 2023-07-14 NOTE — Progress Notes (Signed)
DISCONTINUE OFF PATHWAY REGIMEN - Breast   [id number]:CMF (IV cyclophosphamide) q21 days:   A cycle is every 21 days:     Cyclophosphamide      Methotrexate      5-Fluorouracil   **Always confirm dose/schedule in your pharmacy ordering system**  REASON: Disease Progression PRIOR TREATMENT: Off Pathway: CMF (IV cyclophosphamide) q21 days TREATMENT RESPONSE: Unable to Evaluate  START ON PATHWAY REGIMEN - Breast     A cycle is every 21 days:     Sacituzumab govitecan-hziy   **Always confirm dose/schedule in your pharmacy ordering system**  Patient Characteristics: Distant Metastases or Locoregional Recurrent Disease - Unresected, M0 or Locally Advanced Unresectable Disease Progressing after Neoadjuvant and Local Therapies, M0, HER2 Low/Negative, ER Negative, Chemotherapy, HER2 Negative, Third Line and Beyond,  [employer] or Not a Candidate for Molecular Targeted Therapy Therapeutic Status: Distant Metastases HER2 Status: Negative (-) ER Status: Negative (-) PR Status: Negative (-) Therapy Approach Indicated: Standard Chemotherapy/Endocrine Therapy Line of Therapy: Third Line and Beyond Intent of Therapy: Non-Curative / Palliative Intent, Discussed with Patient

## 2023-07-15 ENCOUNTER — Encounter: Payer: Self-pay | Admitting: Hematology and Oncology

## 2023-07-15 ENCOUNTER — Other Ambulatory Visit: Payer: 59

## 2023-07-15 NOTE — Progress Notes (Signed)
Patient Care Team: Arlina Robes, MD as PCP - General (Family Medicine) Cyndia Bent, MD (Inactive) as Surgeon (General Surgery) Glenford Peers, MD (Hematology and Oncology)  DIAGNOSIS:  Encounter Diagnoses  Name Primary?   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, unspecified estrogen receptor status (HCC) Yes   Mass of axillary tail of right breast     SUMMARY OF ONCOLOGIC HISTORY: Oncology History  Cancer of left breast (HCC)  06/30/2011 Initial Diagnosis   Breast cancer, IDC, Left, Stage III, Triple negative, 7.6 cm breast mass and palpable axillary mass   09/06/2011 Miscellaneous   BRCA 1 and 2: Negative   09/14/2011 - 11/23/2011 Neo-Adjuvant Chemotherapy   Dose dense FEC followed by dose dense Taxotere   12/21/2011 Surgery   Left lumpectomy: High-grade poorly differentiated IDC 1.7 cm with high-grade DCIS, margins negative, 3/7 lymph nodes positive, ER 0%, PR 0%, HER-2 negative ratio 1.33 T1CN1 stage IIb   12/31/2011 - 02/15/2012 Radiation Therapy   Radiation at [facility]   09/26/2017 Relapse/Recurrence   Left breast upper outer quadrant within the lumpectomy bed: Fibrosis no malignancy, right axillary lymph node biopsy metastatic high-grade carcinoma ER 0%, PR 0%, Ki-67 90%, HER-2 negative ratio 1.11 (similar to previous ductal carcinoma)   11/14/2017 - 03/06/2018 Neo-Adjuvant Chemotherapy   Neo-Adjuvant chemotherapy with Gemzar and carboplatin days 1 and 8 every 3 weeks   01/10/2018 Genetic Testing   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  The Hereditary Gene Panel offered by [facility] includes sequencing and/or deletion duplication testing of the following 47 genes: APC, ATM, AXIN2, BARD1, BMPR1A, BRCA1, BRCA2, BRIP1, CDH1, CDK4, CDKN2A (p14ARF), CDKN2A (p16INK4a), CHEK2, CTNNA1, DICER1, EPCAM (Deletion/duplication testing only), GREM1 (promoter region deletion/duplication testing only), KIT, MEN1, MLH1, MSH2,  MSH3, MSH6, MUTYH, NBN, NF1, NHTL1, PALB2, PDGFRA, PMS2, POLD1, POLE, PTEN, RAD50, RAD51C, RAD51D, SDHB, SDHC, SDHD, SMAD4, SMARCA4. STK11, TP53, TSC1, TSC2, and VHL.  The following genes were evaluated for sequence changes only: SDHA and HOXB13 c.251G>A variant only. The report date is January 10, 2018.    05/31/2023 Procedure   Mediastinal mass biopsy: Metastatic poorly differentiated carcinoma compatible with breast primary ER +60%, PR 0%, HER2 0, Ki-67 40%    06/02/2023 PET scan   PET/CT Anterior mediastinal mass is hypermetabolic and centrally necrotic, soft tissue lesions anterior to the heart, 10 mm pleural-based lesion, level 2 cervical nodes, hypermetabolism hepatic dome mottled FDG accumulation throughout the skeletal system    06/06/2023 - 07/14/2023 Anti-estrogen oral therapy   Antiestrogen therapy with Verzenio and letrozole    07/07/2023 Relapse/Recurrence   Increasing mediastinal mass 11.6 cm (used to be 10.8 cm) effacing the heart and involving the sternum, additional mediastinal/pericardial lymph nodes increased right extra lymph node 1.5 cm, nodules 0.2 cm (used to be 0.8 cm), few new scattered lung nodules)   07/25/2023 -  Chemotherapy   Patient is on Treatment Plan : BREAST METASTATIC Sacituzumab govitecan-hziy [name]) D1,8 q21d     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)  04/06/2018 Surgery   Right axillary lymph node dissection: 1/11 node positive for metastatic high-grade carcinoma   04/25/2018 Initial Diagnosis   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)   04/25/2018 Cancer Staging   Staging form: Breast, AJCC 8th Edition - Clinical: Stage IIB (cT0, cN1, cM0, G3, ER-, PR-, HER2-) - Signed by Lonie Peak, MD on 04/25/2018   05/16/2018 - 06/22/2018 Radiation Therapy   Adjuvant radiation therapy with  Xeloda   07/11/2018 - 10/24/2018 Chemotherapy   Adjuvant chemotherapy with CMF     CHIEF COMPLIANT:   Discussed the use of AI scribe software for  clinical note transcription with the patient, who gave verbal consent to proceed.  History of Present Illness   Jessica Simpson, a patient with a history of Metastatic breast cancer with a large mediastinum tumor, presents after a 28-day course of Verzinio. The patient's tumor, located in the middle of the chest, has slightly increased in size from 10.8 cm to 11.6 cm. Additionally, several lymph nodes have shown a millimeter increase in size, and small nodules in the lung have also slightly increased. A vague spot on the liver, previously identified on a PET scan, remains unchanged.   ALLERGIES:  is allergic to codeine and hydrocodone-acetaminophen.  MEDICATIONS:  Current Outpatient Medications  Medication Sig Dispense Refill   abemaciclib (VERZENIO) 100 MG tablet Take 1 tablet (100 mg total) by mouth 2 (two) times daily. 60 tablet 0   acetaminophen (TYLENOL) 500 MG tablet Take 500 mg by mouth every 6 (six) hours as needed. For pain     amLODipine (NORVASC) 5 MG tablet Take 5 mg by mouth daily.  6   Cholecalciferol 5000 units capsule Take 1 capsule (5,000 Units total) by mouth daily.     diphenoxylate-atropine (LOMOTIL) 2.5-0.025 MG tablet Take 1 tablet by mouth 4 (four) times daily as needed for diarrhea or loose stools. 30 tablet 1   ibuprofen (ADVIL) 800 MG tablet Take 1 tablet (800 mg total) by mouth every 8 (eight) hours as needed. 30 tablet 0   letrozole (FEMARA) 2.5 MG tablet Take 1 tablet (2.5 mg total) by mouth daily. 90 tablet 3   loratadine (CLARITIN) 10 MG tablet Take 10 mg by mouth daily.     Omega-3 Fatty Acids (FISH OIL MAXIMUM STRENGTH) 1200 MG CPDR Take 1 capsule by mouth daily.     ondansetron (ZOFRAN) 8 MG tablet Take 1 tablet (8 mg total) by mouth every 8 (eight) hours as needed for nausea or vomiting. 20 tablet 0   prochlorperazine (COMPAZINE) 5 MG tablet Take 1 tablet (5 mg total) by mouth every 6 (six) hours as needed for nausea or vomiting. 30 tablet 0   vitamin B-12  (CYANOCOBALAMIN) 500 MCG tablet Take 500 mcg by mouth daily.     Vitamin D, Ergocalciferol, (DRISDOL) 1.25 MG (50000 UNIT) CAPS capsule Take 50,000 Units by mouth once a week.     carisoprodol (SOMA) 350 MG tablet Take 1 tablet 3 times a day by oral route as needed, for muscle pain. (Patient not taking: Reported on 07/14/2023)     lidocaine-prilocaine (EMLA) cream Apply to affected area once 30 g 3   ondansetron (ZOFRAN) 8 MG tablet Take 1 tablet (8 mg total) by mouth every 8 (eight) hours as needed for nausea or vomiting. Take 1 tablets (8mg  total) by mouth every 8hrs as needed. Start on the third day after chemotherapy. 30 tablet 1   prochlorperazine (COMPAZINE) 10 MG tablet Take 1 tablet (10 mg total) by mouth every 6 (six) hours as needed for nausea or vomiting. 30 tablet 1   traMADol (ULTRAM) 50 MG tablet Take 1 tablet (50 mg total) by mouth every 6 (six) hours as needed. (Patient not taking: Reported on 07/14/2023) 30 tablet 0   No current facility-administered medications for this visit.   Facility-Administered Medications Ordered in Other Visits  Medication Dose Route Frequency Provider Last Rate Last Admin   lactated  ringers infusion    Continuous PRN Andree Elk, CRNA   New Bag at 11/14/17 0700    PHYSICAL EXAMINATION: ECOG PERFORMANCE STATUS: 1 - Symptomatic but completely ambulatory  Vitals:   07/14/23 1154  BP: (!) 154/82  Pulse: (!) 114  Resp: 18  Temp: 97.8 F (36.6 C)  SpO2: 100%   Filed Weights   07/14/23 1154  Weight: 219 lb 1.6 oz (99.4 kg)    LABORATORY DATA:  I have reviewed the data as listed    Latest Ref Rng & Units 07/07/2023   11:18 AM 06/08/2023   11:44 AM 12/21/2018    2:02 PM  CMP  Glucose 70 - 99 mg/dL 87  98  78   BUN 6 - 20 mg/dL 19  14  10    Creatinine 0.44 - 1.00 mg/dL 4.16  6.06  3.01   Sodium 135 - 145 mmol/L 138  139  140   Potassium 3.5 - 5.1 mmol/L 3.3  3.4  3.6   Chloride 98 - 111 mmol/L 101  100  104   CO2 22 - 32 mmol/L 30  33   25   Calcium 8.9 - 10.3 mg/dL 9.5  9.6  9.5   Total Protein 6.5 - 8.1 g/dL 7.5  8.1  7.7   Total Bilirubin 0.3 - 1.2 mg/dL 0.4  0.5  0.4   Alkaline Phos 38 - 126 U/L 81  111  128   AST 15 - 41 U/L 14  14  15    ALT 0 - 44 U/L 17  13  14      Lab Results  Component Value Date   WBC 2.2 (L) 07/07/2023   HGB 10.3 (L) 07/07/2023   HCT 30.7 (L) 07/07/2023   MCV 83.2 07/07/2023   PLT 123 (L) 07/07/2023   NEUTROABS 1.0 (L) 07/07/2023    ASSESSMENT & PLAN:  Cancer of left breast (HCC) Current treatment: Verzinio with letrozole started 06/08/2023 CT CAP: Increasing mediastinal mass 11.6 cm (used to be 10.8 cm) effacing the heart and involving the sternum, additional mediastinal/pericardial lymph nodes increased right extra lymph node 1.5 cm, nodules 0.2 cm (used to be 0.8 cm), few new scattered lung nodules)  Discussion: I am very concerned about the progression of disease especially effacing the heart and involving the sternum.  Treatment plan: Radiation oncology consultation to see if palliative radiation can be done. Change treatment to Trodelvy day 1 day 8 every 3 weeks  Sacituzumab-Govitecan counseling: I discussed with the patient that this drug is an antibody drug conjugate. Its toxicities are related to neutropenia and diarrhea and hypersensitivity reactions primarily.  The range of adverse effects can be quite extensive including's edema, skin reactions, anemia, elevation of LFTs, dizziness fatigue joint pains and dyspnea.  Diarrhea can be quite prevalent and up to 62% of patients.  Hypersensitivity reactions and 37%.  Grade 3 and 4 neutropenia and 26% of patients.  Phase 2 study showed one third of patients who got this drug had a response that lasted an average of 7.7 months.  Return to clinic in 1 week to start treatment.  ------------------------------------- Assessment and Plan    Metastatic Lung Cancer Progression of disease on Verzenio with increase in size of mediastinal  mass and lymph nodes. A vague spot on the liver remains stable. -Discontinue Verzenio. -Consult Radiation Oncology for consideration of radiation to mediastinal mass. -Plan to start [name] (given on day 1 and day 8 every three weeks) after patient's vacation. -Monitor  blood counts closely due to risk of anemia and leukopenia. -Plan to reinsert port for chemotherapy administration.  Liver Lesion Stable size, unclear significance. -Continue to monitor on subsequent imaging.  General Health Maintenance / Followup Plans -Encourage patient to proceed with planned vacation for mental health. -Postpone second round of Trodelvy to accommodate patient's vacation. -Follow-up after vacation to assess response to treatment and adjust plan as necessary.      Orders Placed This Encounter  Procedures   Consent Attestation for Oncology Treatment    Order Specific Question:   The patient is informed of risks, benefits, side-effects of the prescribed oncology treatment. Potential short term and long term side effects and response rates discussed. After a long discussion, the patient made informed decision to proceed.    Answer:   Yes   CBC with Differential (Cancer Center Only)    Standing Status:   Future    Standing Expiration Date:   07/24/2024   CMP (Cancer Center only)    Standing Status:   Future    Standing Expiration Date:   07/24/2024   Magnesium    Standing Status:   Future    Standing Expiration Date:   07/24/2024   Phosphorus    Standing Status:   Future    Standing Expiration Date:   07/24/2024   CBC with Differential (Cancer Center Only)    Standing Status:   Future    Standing Expiration Date:   07/31/2024   CMP (Cancer Center only)    Standing Status:   Future    Standing Expiration Date:   07/31/2024   Magnesium    Standing Status:   Future    Standing Expiration Date:   07/31/2024   Phosphorus    Standing Status:   Future    Standing Expiration Date:   07/31/2024   CBC with  Differential (Cancer Center Only)    Standing Status:   Future    Standing Expiration Date:   08/14/2024   CMP (Cancer Center only)    Standing Status:   Future    Standing Expiration Date:   08/14/2024   Magnesium    Standing Status:   Future    Standing Expiration Date:   08/14/2024   Phosphorus    Standing Status:   Future    Standing Expiration Date:   08/14/2024   CBC with Differential (Cancer Center Only)    Standing Status:   Future    Standing Expiration Date:   08/21/2024   CMP (Cancer Center only)    Standing Status:   Future    Standing Expiration Date:   08/21/2024   Magnesium    Standing Status:   Future    Standing Expiration Date:   08/21/2024   Phosphorus    Standing Status:   Future    Standing Expiration Date:   08/21/2024   CBC with Differential (Cancer Center Only)    Standing Status:   Future    Standing Expiration Date:   09/04/2024   CMP (Cancer Center only)    Standing Status:   Future    Standing Expiration Date:   09/04/2024   Magnesium    Standing Status:   Future    Standing Expiration Date:   09/04/2024   Phosphorus    Standing Status:   Future    Standing Expiration Date:   09/04/2024   CBC with Differential (Cancer Center Only)    Standing Status:   Future    Standing Expiration  Date:   09/11/2024   CMP (Cancer Center only)    Standing Status:   Future    Standing Expiration Date:   09/11/2024   Magnesium    Standing Status:   Future    Standing Expiration Date:   09/11/2024   Phosphorus    Standing Status:   Future    Standing Expiration Date:   09/11/2024   CBC with Differential (Cancer Center Only)    Standing Status:   Future    Standing Expiration Date:   09/25/2024   CMP (Cancer Center only)    Standing Status:   Future    Standing Expiration Date:   09/25/2024   Magnesium    Standing Status:   Future    Standing Expiration Date:   09/25/2024   Phosphorus    Standing Status:   Future    Standing Expiration Date:   09/25/2024    CBC with Differential (Cancer Center Only)    Standing Status:   Future    Standing Expiration Date:   10/02/2024   CMP (Cancer Center only)    Standing Status:   Future    Standing Expiration Date:   10/02/2024   Magnesium    Standing Status:   Future    Standing Expiration Date:   10/02/2024   Phosphorus    Standing Status:   Future    Standing Expiration Date:   10/02/2024   Physician communication order    Patients with Reduced UGT1A1 Activity: Individuals who are homozygous for the uridine diphosphate-glucuronosyl transferase 1A1 (UGT1A1)*28 allele are at increased risk for neutropenia following treatment. Avoid concomitant use with UGT1A1 inhibitors or inducers.   The patient has a good understanding of the overall plan. she agrees with it. she will call with any problems that may develop before the next visit here. Total time spent: 30 mins including face to face time and time spent for planning, charting and co-ordination of care   Tamsen Meek, MD 07/15/23

## 2023-07-15 NOTE — Progress Notes (Signed)
Histology and Location of Primary Cancer:       Location(s) of Symptomatic tumor(s):  05-31-23 FINAL MICROSCOPIC DIAGNOSIS:   A. MEDIASTINAL MASS, ANTERIOR, BIOPSY:  -  Metastatic poorly differentiated adenocarcinoma, see note.   Note: The clinical history of breast cancer is noted.  While GATA3 is  negative, the tumor cells are strongly positive for CK7.  They are also  negative for p40 and TTF-1.  The above findings while not pathognomonic  given the lack of GATA3 staining are compatible with the patient's  history of breast cancer.  The immunohistochemical differential would  also include upper gastrointestinal origins, amongst others.  Clinical  correlation recommended.   NM PET Image Restag (PS) Skull Base To Thigh 06/02/2023  IMPRESSION: 1. The anterior mediastinal mass seen on recent CT imaging is hypermetabolic and centrally necrotic. 2. Soft tissue lesions anterior to the heart and deep to the xiphoid process seen on recent CT imaging are hypermetabolic. 3. 10 mm pleural base lesion in the posteromedial left costophrenic sulcus is hypermetabolic. 4. Hypermetabolic left-sided level II cervical lymph nodes, increased since prior PET-CT. 5. Focal hypermetabolic lesion along the anterior hepatic dome. There is no discernible lesion in this region on noncontrast CT imaging. It may be a right-sided low mediastinal nodule, diaphragmatic lesion, or subcapsular lesion of the anterior liver. Sagittal PET imaging suggests that this is hepatic in etiology. MRI of the abdomen with and without contrast would likely prove helpful to further evaluate. 6. Focal uptake/hypermetabolism in the region of the porta hepatis, again without a discrete lesion or lymph node evident on noncontrast CT imaging. This could be a hypermetabolic hepatoduodenal ligament lymph node or hepatic lesion in the caudate lobe of the liver. 7. Mottled FDG accumulation throughout the skeletal anatomy.  While this may be treatment related and no discrete worrisome lytic or sclerotic osseous abnormality is evident, metastatic disease is not excluded. 8.  Aortic Atherosclerosis (ICD10-I70.0).  CT CHEST ABDOMEN PELVIS W CONTRAST 07/07/2023  IMPRESSION: 1. Increased size of the large anterior mediastinal mass which effaces the heart and involves the sternum. 2. Increased size of a right axillary and mediastinal/pericardial lymph nodes. 3. Increased size of a pleural-based left lower lobe pulmonary nodule, additionally there are a few new nonspecific scattered pulmonary nodules measuring up to 5 mm. 4. Ill-defined 19 mm hypodense segment IVa hepatic lesion corresponds with the area of hypermetabolism seen on prior PET-CT compatible with metastatic disease involvement. 5. Sclerotic focus in the L1 vertebral body is unchanged from prior examination. 6.  Aortic Atherosclerosis (ICD10-I70.0).    Past/Anticipated chemotherapy by medical oncology, if any:  Serena Croissant, MD 07-14-23 Oncology History  Cancer of left breast (HCC)  06/30/2011 Initial Diagnosis    Breast cancer, IDC, Left, Stage III, Triple negative, 7.6 cm breast mass and palpable axillary mass    09/06/2011 Miscellaneous    BRCA 1 and 2: Negative    09/14/2011 - 11/23/2011 Neo-Adjuvant Chemotherapy    Dose dense FEC followed by dose dense Taxotere    12/21/2011 Surgery    Left lumpectomy: High-grade poorly differentiated IDC 1.7 cm with high-grade DCIS, margins negative, 3/7 lymph nodes positive, ER 0%, PR 0%, HER-2 negative ratio 1.33 T1CN1 stage IIb    12/31/2011 - 02/15/2012 Radiation Therapy    Radiation at [facility]    09/26/2017 Relapse/Recurrence    Left breast upper outer quadrant within the lumpectomy bed: Fibrosis no malignancy, right axillary lymph node biopsy metastatic high-grade carcinoma ER 0%, PR 0%, Ki-67 90%, HER-2  negative ratio 1.11 (similar to previous ductal carcinoma)    11/14/2017 - 03/06/2018 Neo-Adjuvant  Chemotherapy    Neo-Adjuvant chemotherapy with Gemzar and carboplatin days 1 and 8 every 3 weeks    01/10/2018 Genetic Testing    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  The Hereditary Gene Panel offered by [facility] includes sequencing and/or deletion duplication testing of the following 47 genes: APC, ATM, AXIN2, BARD1, BMPR1A, BRCA1, BRCA2, BRIP1, CDH1, CDK4, CDKN2A (p14ARF), CDKN2A (p16INK4a), CHEK2, CTNNA1, DICER1, EPCAM (Deletion/duplication testing only), GREM1 (promoter region deletion/duplication testing only), KIT, MEN1, MLH1, MSH2, MSH3, MSH6, MUTYH, NBN, NF1, NHTL1, PALB2, PDGFRA, PMS2, POLD1, POLE, PTEN, RAD50, RAD51C, RAD51D, SDHB, SDHC, SDHD, SMAD4, SMARCA4. STK11, TP53, TSC1, TSC2, and VHL.  The following genes were evaluated for sequence changes only: SDHA and HOXB13 c.251G>A variant only. The report date is January 10, 2018.      05/31/2023 Procedure    Mediastinal mass biopsy: Metastatic poorly differentiated carcinoma compatible with breast primary ER +60%, PR 0%, HER2 0, Ki-67 40%     06/02/2023 PET scan    PET/CT Anterior mediastinal mass is hypermetabolic and centrally necrotic, soft tissue lesions anterior to the heart, 10 mm pleural-based lesion, level 2 cervical nodes, hypermetabolism hepatic dome mottled FDG accumulation throughout the skeletal system     06/06/2023 - 07/14/2023 Anti-estrogen oral therapy    Antiestrogen therapy with Verzenio and letrozole     07/07/2023 Relapse/Recurrence    Increasing mediastinal mass 11.6 cm (used to be 10.8 cm) effacing the heart and involving the sternum, additional mediastinal/pericardial lymph nodes increased right extra lymph node 1.5 cm, nodules 0.2 cm (used to be 0.8 cm), few new scattered lung nodules)    07/25/2023 -  Chemotherapy    Patient is on Treatment Plan : BREAST METASTATIC Sacituzumab govitecan-hziy [name]) D1,8 q21d     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)   04/06/2018 Surgery    Right axillary lymph node dissection: 1/11 node positive for metastatic high-grade carcinoma    04/25/2018 Initial Diagnosis    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)    04/25/2018 Cancer Staging    Staging form: Breast, AJCC 8th Edition - Clinical: Stage IIB (cT0, cN1, cM0, G3, ER-, PR-, HER2-) - Signed by Lonie Peak, MD on 04/25/2018    05/16/2018 - 06/22/2018 Radiation Therapy    Adjuvant radiation therapy with Xeloda    07/11/2018 - 10/24/2018 Chemotherapy    Adjuvant chemotherapy with CMF     ASSESSMENT & PLAN:  Cancer of left breast (HCC) Current treatment: Verzinio with letrozole started 06/08/2023 CT CAP: Increasing mediastinal mass 11.6 cm (used to be 10.8 cm) effacing the heart and involving the sternum, additional mediastinal/pericardial lymph nodes increased right extra lymph node 1.5 cm, nodules 0.2 cm (used to be 0.8 cm), few new scattered lung nodules)   Discussion: I am very concerned about the progression of disease especially effacing the heart and involving the sternum.   Treatment plan: Radiation oncology consultation to see if palliative radiation can be done. Change treatment to Trodelvy day 1 day 8 every 3 weeks   Sacituzumab-Govitecan counseling: I discussed with the patient that this drug is an antibody drug conjugate. Its toxicities are related to neutropenia and diarrhea and hypersensitivity reactions primarily.  The range of adverse effects can be quite extensive including's edema, skin reactions, anemia, elevation of LFTs, dizziness fatigue joint pains and dyspnea.  Diarrhea can be quite prevalent and  up to 62% of patients.  Hypersensitivity reactions and 37%.  Grade 3 and 4 neutropenia and 26% of patients.  Phase 2 study showed one third of patients who got this drug had a response that lasted an average of 7.7 months.   Return to clinic in 1 week to start treatment.   ------------------------------------- Assessment and  Plan    Metastatic Lung Cancer Progression of disease on Verzenio with increase in size of mediastinal mass and lymph nodes. A vague spot on the liver remains stable. -Discontinue Verzenio. -Consult Radiation Oncology for consideration of radiation to mediastinal mass. -Plan to start Trodelvy (given on day 1 and day 8 every three weeks) after patient's vacation. -Monitor blood counts closely due to risk of anemia and leukopenia. -Plan to reinsert port for chemotherapy administration.   Liver Lesion Stable size, unclear significance. -Continue to monitor on subsequent imaging.   General Health Maintenance / Followup Plans -Encourage patient to proceed with planned vacation for mental health. -Postpone second round of Trodelvy to accommodate patient's vacation. -Follow-up after vacation to assess response to treatment and adjust plan as necessary.   Patient's main complaints related to symptomatic tumor(s) are: shown on imaging  Pain on a scale of 0-10 is: no pain to report   If Spine Met(s), symptoms, if any, include: Bowel/Bladder retention or incontinence (please describe):na Numbness or weakness in extremities (please describe): na Current Decadron regimen, if applicable: na  Ambulatory status? Walker? Wheelchair?: Ambulates well though fatigued this week.   SAFETY ISSUES: Prior radiation? Yes,  Pacemaker/ICD? Pacemaker Possible current pregnancy? no Is the patient on methotrexate? no  Additional Complaints / other details:  Pt does report a dry cough and shortness of breath. She also reports fatigue as well after ambulation.  How will be protect the esophagus? How will past radiation effect radiation this time? Pt does report having back pain as well, she has concerns about laying on the table.   Vitals:   07/19/23 1312  BP: 137/66  Pulse: 60  Resp: 20  Temp: (!) 97.4 F (36.3 C)  SpO2: 100%

## 2023-07-16 ENCOUNTER — Other Ambulatory Visit: Payer: Self-pay

## 2023-07-18 NOTE — Progress Notes (Incomplete)
Radiation Oncology         [phone]) [phone] ________________________________  Outpatient Re-Consultation   Name: Jessica Simpson MRN: [id number]  Date: 07/19/2023  DOB: 1971/08/20  CC:Arlina Robes, MD  Serena Croissant, MD   REFERRING PHYSICIAN: Serena Croissant, MD  DIAGNOSIS:    ICD-10-CM   1. 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) Staging form: Breast, AJCC 8th Edition - Clinical: Stage IIB (cT0, cN1, cM0, G3, ER-, PR-, HER2-) - Signed by Lonie Peak, MD on 04/25/2018 Histologic grading system: 3 grade system - Pathologic: No Stage Recommended (ypT0, pN1, cM0, G3, ER-, PR-, HER2-) - Signed by Lonie Peak, MD on 04/25/2018 Stage prefix: Post-therapy Neoadjuvant therapy: Yes Histologic grading system: 3 grade system Laterality: Right  Cancer of left breast (HCC) Staging form: Breast, AJCC 7th Edition - Clinical: Stage IIIA (T3, N2, cM0) - Signed by Randall An, MD on 08/03/2011 Specimen type: Core Needle Biopsy Histopathologic type: Infiltrating duct carcinoma, NOS Tumor size (mm): 66 Histologic grade (G): G3 Prognostic indicators: Triple negative. KI-67 of 99%    - Pathologic: No stage assigned - Unsigned Specimen type: Core Needle Biopsy Histopathologic type: Infiltrating duct carcinoma, NOS Tumor size (mm): 66 Prognostic indicators: Triple negative. 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 S/p: right axillary lymph node dissection, radiation, and adjuvant chemotherapy   History of left breast cancer diagnosed in 2012: s/p neoadjuvant FEC-Taxotere, lumpectomy, and radiation  CHIEF COMPLAINT: Here  to discuss management of metastatic breast cancer to the mediastinum (anterior mediastinal mass)  HISTORY OF PRESENT ILLNESS:Jessica Simpson is a 52 y.o. female who is known to me for her history of right breast/axillary high-grade carcinoma, s/p radiation which she completed in August 2019. She was last seen here for follow-up on 07/22/23. To review, she received xeloda along with XRT to the right breast and axilla. After completing radiation, she was treated with adjuvant chemotherapy consisting of CMF from 07/11/2018 through 10/24/2018 under the care of Dr. Pamelia Hoit.   Since completing chemotherapy, she has continued under surveillance with Dr. Pamelia Hoit and remained NED until recent history (detailed as followed).   During a routine surveillance visit with Dr. Pamelia Hoit on 05/10/23, the patient endorsed a new onset of pain between her shoulder blades. A CT CAP without contrast was subsequently performed on 05/20/23 which unfortunately revealed evidence of metastatic disease within the anterior mediastinum, consisting of a dominant 10.8 cm mass resulting in direct sternal body destruction. An isolated pleural-based left lower lobe pulmonary nodule was also demonstrated, possibly representing parenchymal or pleural based metastasis. CT otherwise showed no evidence of subdiaphragmatic metastatic disease.  Biopsy of the anterior mediastinal mass collected on 05/31/23 showed metastatic poorly differentiated adenocarcinoma, likely compatible with a breast cancer primary.    PET scan on 06/02/23 demonstrated: hypermetabolism associated with the centrally necrotic anterior mediastinal mass; hypermetabolic soft tissue lesions anterior to the heart and deep to the xiphoid process; a hypermetabolic 10 mm pleural base lesion in the posteromedial left costophrenic sulcus; and hypermetabolic left-sided level II cervical lymph nodes. Other findings noted included a: focal hypermetabolic lesion along the anterior  hepatic dome (without a discernible  lesion); focal uptake/hypermetabolism in the region of the porta hepatis (again without a discrete lesion or evident lymph node), and indeterminate mottled FDG accumulation throughout the skeletal anatomy without any discrete worrisome lytic or sclerotic osseous abnormality appreciated.    After a detailed discussion with Dr. Pamelia Hoit on 08/12, she agreed to proceed with antiestrogen therapy with Verzenio and letrozole.   To assess for a treatment response, a CT CAP with contrast was performed on 07/07/23 which unfortunately showed evidence of disease progression, characterized by: an interval increase in size of the large anterior mediastinal mass effacing the heart and involving the sternum, measuring 11.6 cm, previously 10.8 cm; an interval increase in size right axillary and mediastinal/pericardial lymphadenopathy; an increase in size of the previously demonstrated pleural-based left lower lobe pulmonary nodule; several new nonspecific scattered pulmonary nodules measuring up to 5 mm; and an ill-defined 19 mm hypodense segment IVa hepatic lesion corresponding with the area of hypermetabolism seen on her prior PET-CT, which remains indeterminate for metastatic disease involvement.  Based on evidence of significant disease progression, Verzenio and letrozole have been discontinued. After she returns from her upcoming vacation, Dr. Pamelia Hoit would like to try her on Trodelvy q 3 weeks, along with radiation to the mediastinal mass which we will discuss in detail today.  Pt reporter her new therapy will start before her vacation  She has been referred to general surgery and is scheduled for port placement on 07/20/23. Patient is experiencing some positional back pain that she takes Ibuprofen for. She denies any chest pain. Of note, she does have a pacemaker in place.   PREVIOUS RADIATION THERAPY: Yes   2) Diagnosis: Right Breast Axillary High Grade Carcinoma of presumed  breast Primary, ER- / PR- / Her2-, High grade   Indication for treatment:  Curative      Radiation treatment dates:   05/16/2018 - 06/22/2018 Site/dose:    1. Right Breast / 50.4 Gy in 28 fractions 2. Right posterior axilla and SCV nodes / 50.4 Gy in 28 fractions Beams/energy:    1. 3D / 10X, 15X Photon 2. 3D / 15X, 6X Photon  1)  Diagnosis: Triple negative left breast cancer, clinical T2 N1 M0, yp T1c. N1a. M0 Interval Since Last Radiation:  She completed a total dose of 60 Gray in 30 fractions on 02/29/2012. She receive 50 gray in 25 fractions to the left breast, supraclavicular, and axillary regions. She then received a boost to the left lumpectomy of 10 gray in 5 fractions. This was given at [facility].  PAST MEDICAL HISTORY:  has a past medical history of BRCA1 negative (10/22/2011), BRCA2 negative (10/22/2011), Breast cancer, IDC, Left, Stage III, Triple negative (06/30/2011), Breast disorder, Contraceptive education (01/15/2016), Family history of breast cancer, Fracture, History of breast cancer (07/22/2014), History of radiation therapy (05/16/18- 06/22/18), Hypertension, Lymphedema (06/15/2012), Lymphedema of arm, Papanicolaou smear of cervix with positive high risk human papilloma virus (HPV) test (12/25/2020), Personal history of chemotherapy, Personal history of radiation therapy, Positive fecal occult blood test (07/22/2014), Presence of permanent cardiac pacemaker, S/P radiation therapy (2013), Sleep apnea, and Status post chemotherapy (Comp. 11/23/11).    PAST SURGICAL HISTORY: Past Surgical History:  Procedure Laterality Date   anal ascess     turned into a fistula with extensive treatment   AXILLARY LYMPH NODE BIOPSY Right 04/06/2018   AXILLARY LYMPH NODE DISSECTION Right 04/06/2018   Procedure: RIGHT AXILLARY LYMPH NODE DISSECTION;  Surgeon: Emelia Loron, MD;  Location: [facility] OR;  Service: General;  Laterality: Right;   BREAST BIOPSY Left  2012   BREAST BIOPSY Right 2019   BREAST LUMPECTOMY Left 2012   BREAST LUMPECTOMY Right 2019   rt axilla   BREAST SURGERY     CARDIAC ELECTROPHYSIOLOGY MAPPING AND ABLATION     CESAREAN SECTION     COLONOSCOPY N/A 08/14/2014   Procedure: COLONOSCOPY;  Surgeon: Malissa Hippo, MD;  Location: [facility];  Service: Endoscopy;  Laterality: N/A;  930   EVACUATION BREAST HEMATOMA  12/21/2011   Procedure: EVACUATION HEMATOMA BREAST;  Surgeon: Almond Lint, MD;  Location: [facility] OR;  Service: General;  Laterality: Left;   HAND SURGERY     tumor on finger, left hand   INCISION AND DRAINAGE ABSCESS ANAL     x3   left breast biopsy with axillary biopsy     core bx   PORT-A-CATH REMOVAL  12/21/2011   Procedure: REMOVAL PORT-A-CATH;  Surgeon: Currie Paris, MD;  Location: [facility];  Service: General;  Laterality: N/A;   PORTACATH PLACEMENT  08/02/2011   dr Lenord Carbo PLACEMENT Right 11/14/2017   Procedure: INSERTION PORT-A-CATH WITH [place];  Surgeon: Emelia Loron, MD;  Location: [facility] OR;  Service: General;  Laterality: Right;   removal breast mass  2004   benign   TREATMENT FISTULA ANAL     TUMOR REMOVAL     on left hand   TUMOR REMOVAL     rt. eye    FAMILY HISTORY: family history includes Breast cancer in an other family member; Cancer in her maternal grandfather and maternal grandmother; Cancer (age of onset: 33) in her father.  SOCIAL HISTORY:  reports that she has never smoked. She has never used smokeless tobacco. She reports that she does not drink alcohol and does not use drugs.  ALLERGIES: Codeine and Hydrocodone-acetaminophen  MEDICATIONS:  Current Outpatient Medications  Medication Sig Dispense Refill   acetaminophen (TYLENOL) 500 MG tablet Take 500 mg by mouth every 6 (six) hours as needed. For pain     amLODipine (NORVASC) 5 MG tablet Take 10 mg by mouth daily.  6   carisoprodol (SOMA) 350 MG tablet Take 350 mg by mouth daily as needed for  muscle spasms.     Cyanocobalamin (VITAMIN B-12) 5000 MCG TBDP Take 5,000 mcg by mouth daily.     ibuprofen (ADVIL) 800 MG tablet Take 1 tablet (800 mg total) by mouth every 8 (eight) hours as needed. 30 tablet 0   loratadine (CLARITIN) 10 MG tablet Take 10 mg by mouth daily as needed for allergies.     Omega-3 Fatty Acids (FISH OIL MAXIMUM STRENGTH) 1200 MG CPDR Take 1,200 mg by mouth daily.     ondansetron (ZOFRAN) 8 MG tablet Take 1 tablet (8 mg total) by mouth every 8 (eight) hours as needed for nausea or vomiting. 20 tablet 0   Vitamin D, Ergocalciferol, (DRISDOL) 1.25 MG (50000 UNIT) CAPS capsule Take 50,000 Units by mouth once a week.     abemaciclib (VERZENIO) 100 MG tablet Take 1 tablet (100 mg total) by mouth 2 (two) times daily. (Patient not taking: Reported on 07/18/2023) 60 tablet 0   diphenoxylate-atropine (LOMOTIL) 2.5-0.025 MG tablet Take 1 tablet by mouth 4 (four) times daily as needed for diarrhea or loose stools. (Patient not taking: Reported on 07/18/2023) 30 tablet 1   letrozole (FEMARA) 2.5 MG tablet Take 1 tablet (2.5 mg total) by mouth daily. (Patient not taking: Reported on 07/18/2023)  90 tablet 3   lidocaine-prilocaine (EMLA) cream Apply to affected area once (Patient not taking: Reported on 07/18/2023) 30 g 3   ondansetron (ZOFRAN) 8 MG tablet Take 1 tablet (8 mg total) by mouth every 8 (eight) hours as needed for nausea or vomiting. Take 1 tablets (8mg  total) by mouth every 8hrs as needed. Start on the third day after chemotherapy. (Patient not taking: Reported on 07/18/2023) 30 tablet 1   prochlorperazine (COMPAZINE) 10 MG tablet Take 1 tablet (10 mg total) by mouth every 6 (six) hours as needed for nausea or vomiting. (Patient not taking: Reported on 07/19/2023) 30 tablet 1   prochlorperazine (COMPAZINE) 5 MG tablet Take 1 tablet (5 mg total) by mouth every 6 (six) hours as needed for nausea or vomiting. (Patient not taking: Reported on 07/18/2023) 30 tablet 0   traMADol (ULTRAM)  50 MG tablet Take 1 tablet (50 mg total) by mouth every 6 (six) hours as needed. (Patient not taking: Reported on 07/14/2023) 30 tablet 0   No current facility-administered medications for this encounter.   Facility-Administered Medications Ordered in Other Encounters  Medication Dose Route Frequency Provider Last Rate Last Admin   lactated ringers infusion    Continuous PRN Andree Elk, CRNA   New Bag at 11/14/17 0700    REVIEW OF SYSTEMS:  Notable for that above.   PHYSICAL EXAM:  height is 5\' 6"  (1.676 m) and weight is 215 lb 9.6 oz (97.8 kg). Her temperature is 97.4 F (36.3 C) (abnormal). Her blood pressure is 137/66 and her pulse is 60. Her respiration is 20 and oxygen saturation is 100%.   General: Alert and oriented, in no acute distress  HEENT: Head is normocephalic. Extraocular movements are intact. Oropharynx is clear. Neck: Neck is supple, no palpable cervical or supraclavicular lymphadenopathy. Heart: Regular in rate and rhythm with no murmurs, rubs, or gallops. Chest: Clear to auscultation bilaterally, with no rhonchi, wheezes, or rales.  Abdomen: Soft, nontender, nondistended, with no rigidity or guarding. Extremities: No cyanosis or edema. Lymphatics: see Neck Exam Skin: No concerning lesions. Musculoskeletal: symmetric strength and muscle tone throughout. Neurologic: Cranial nerves II through XII are grossly intact. No obvious focalities. Speech is fluent. Coordination is intact. Psychiatric: Judgment and insight are intact. Affect is appropriate.   ECOG = 1  0 - Asymptomatic (Fully active, able to carry on all predisease activities without restriction)  1 - Symptomatic but completely ambulatory (Restricted in physically strenuous activity but ambulatory and able to carry out work of a light or sedentary nature. For example, light housework, office work)  2 - Symptomatic, <50% in bed during the day (Ambulatory and capable of all self care but unable to carry  out any work activities. Up and about more than 50% of waking hours)  3 - Symptomatic, >50% in bed, but not bedbound (Capable of only limited self-care, confined to bed or chair 50% or more of waking hours)  4 - Bedbound (Completely disabled. Cannot carry on any self-care. Totally confined to bed or chair)  5 - Death   Santiago Glad MM, Creech RH, Tormey DC, et al. [phone]). "Toxicity and response criteria of the [facility]". Am. Evlyn Clines. Oncol. 5 (6): 649-55   LABORATORY DATA:  Lab Results  Component Value Date   WBC 2.2 (L) 07/07/2023   HGB 10.3 (L) 07/07/2023   HCT 30.7 (L) 07/07/2023   MCV 83.2 07/07/2023   PLT 123 (L) 07/07/2023   CMP  Component Value Date/Time   NA 138 07/07/2023 1118   NA 140 07/26/2016 1650   K 3.3 (L) 07/07/2023 1118   CL 101 07/07/2023 1118   CO2 30 07/07/2023 1118   GLUCOSE 87 07/07/2023 1118   BUN 19 07/07/2023 1118   BUN 13 07/26/2016 1650   CREATININE 1.39 (H) 07/07/2023 1118   CALCIUM 9.5 07/07/2023 1118   PROT 7.5 07/07/2023 1118   PROT 7.4 07/26/2016 1650   ALBUMIN 4.0 07/07/2023 1118   ALBUMIN 4.2 07/26/2016 1650   AST 14 (L) 07/07/2023 1118   ALT 17 07/07/2023 1118   ALKPHOS 81 07/07/2023 1118   BILITOT 0.4 07/07/2023 1118   GFRNONAA 46 (L) 07/07/2023 1118         RADIOGRAPHY: CT CHEST ABDOMEN PELVIS W CONTRAST  Result Date: 07/07/2023 CLINICAL DATA:  Invasive breast cancer, follow-up. * Tracking Code: BO * EXAM: CT CHEST, ABDOMEN, AND PELVIS WITH CONTRAST TECHNIQUE: Multidetector CT imaging of the chest, abdomen and pelvis was performed following the standard protocol during bolus administration of intravenous contrast. RADIATION DOSE REDUCTION: This exam was performed according to the departmental dose-optimization program which includes automated exposure control, adjustment of the mA and/or kV according to patient size and/or use of iterative reconstruction technique. CONTRAST:  OMNIPAQUE IOHEXOL 300 MG/ML   SOLN COMPARISON:  Multiple priors including most recent PET-CT June 02, 2023. FINDINGS: CT CHEST FINDINGS Cardiovascular: Left chest pacemaker with leads in the right atrium and right ventricle. Right ventricle is slightly compressed by the large anterior mediastinal mass. Aortic atherosclerosis. No central pulmonary embolus on this nondedicated study. Trace pericardial effusion. Mediastinum/Nodes: Large anterior mediastinal mass which effaces the heart and involves the sternum measures 11.6 x 7.6 cm on image 21/2 previously 10.8 x 6.7 cm. Additional mediastinal/pericardial lymph nodes measure up to 2 cm in short axis on image 40/2 previously 1.9 cm. Increased size of a right axillary lymph node now measuring 15 mm in short axis on image 15/2 previously 6 mm. Lungs/Pleura: Pleural-based left lower lobe pulmonary nodule measures 12 mm on image 113/7 previously 8 mm. Subtle nodularity along the lateral aspect of the left lower lobe pleura for instance on image 107/2 is similar prior. A few new scattered pulmonary nodules.  For reference: -New 3 mm right perifissural pulmonary nodule on image 68/7. -New 5 mm subpleural pulmonary nodule in the right middle lobe adjacent to the mediastinum on image 78/7. New ground-glass opacities in the inferior aspect of the lingula Musculoskeletal: Sternal body involvement of the anterior mediastinal mass again seen. Sclerotic focus in the L1 vertebral body on image 109/6 is unchanged from prior examination. CT ABDOMEN PELVIS FINDINGS Hepatobiliary: Ill-defined 19 mm hypodense segment IVa hepatic lesion on image 47/2 corresponds with the area of hypermetabolism seen on prior PET-CT. Hypodensity along the falciform ligament in segment IVb on image 55/2 is nonspecific commonly reflecting fatty infiltration or differential perfusion suggest attention on follow-up imaging. Gallbladder is unremarkable.  No biliary ductal dilation. Pancreas: No pancreatic ductal dilation or evidence of  acute inflammation. Spleen: No splenomegaly or focal splenic lesion. Adrenals/Urinary Tract: Bilateral adrenal glands appear normal. No hydronephrosis. Kidneys demonstrate symmetric enhancement. Urinary bladder is unremarkable for degree of distension. Stomach/Bowel: Stomach is unremarkable for degree of distension. No pathologic dilation of small or large bowel. Moderate volume of formed stool in the colon. No evidence of acute bowel inflammation. Vascular/Lymphatic: Aortic atherosclerosis. Smooth IVC contours. The portal, splenic and superior mesenteric veins are patent. No pathologically enlarged abdominal or  pelvic lymph nodes identified. Reproductive: Uterus and bilateral adnexa are unremarkable. Other: No significant abdominopelvic free fluid. Pelvic floor laxity. Musculoskeletal: No acute osseous abnormality. IMPRESSION: 1. Increased size of the large anterior mediastinal mass which effaces the heart and involves the sternum. 2. Increased size of a right axillary and mediastinal/pericardial lymph nodes. 3. Increased size of a pleural-based left lower lobe pulmonary nodule, additionally there are a few new nonspecific scattered pulmonary nodules measuring up to 5 mm. 4. Ill-defined 19 mm hypodense segment IVa hepatic lesion corresponds with the area of hypermetabolism seen on prior PET-CT compatible with metastatic disease involvement. 5. Sclerotic focus in the L1 vertebral body is unchanged from prior examination. 6.  Aortic Atherosclerosis (ICD10-I70.0). Electronically Signed   By: Maudry Mayhew M.D.   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 Most recent imaging demonstrates an enlarging mediastinal mass, indicating a poor response to systemic therapy. She may benefit from targeted radiation  to the enlarging mass.   Patient is understandably struggling with her diagnosis. Referral placed to social work today for counseling services.   We reviewed the nature of metastatic breast cancer and the role radiation plays in treatment. We discussed the logistics of treatment along with the benefits, risks, and possible side effects of treatment. Side effects include, but are not limited to; skin irritation, fatigue, esophagitis, localized hair loss, cardiac damage, lung scarring/irritation, bone fragility in the treated areas. Patient expressed understanding of the treatment, which is of palliative intent, and would like to proceed. All of her questions were answered today. A consent form was reviewed and signed today.   She is scheduled for CT simulation later today. Anticipate 30 Gy in 10 fractions to the mediastinal mass. We appreciate the opportunity to again participate in this patient's care.  Given an important vacation with her fiance I think it is reasonable for [place] to hold RT under her return in mid Oct.  Of note, patient does have a pacemaker. A form was sent to our device clinic today.   Patient is under the age of 20. Pregnancy ordered today.  Pt did not show to lab, nursing asked to ensure this lab is done prior to starting RT.  On date of service, in total, I spent 40 minutes on this encounter. Patient was seen in person. Note signed after encounter date; minutes pertain to date of service, only.  __________________________________________   Joyice Faster, PA-C    Lonie Peak, MD  This document serves as a record of services personally performed by Lonie Peak, MD. It was created on her behalf by Neena Rhymes, a trained medical scribe. The creation of this record is based on the scribe's personal observations and the provider's statements to them. This document has been checked and approved by the attending provider.

## 2023-07-18 NOTE — Progress Notes (Signed)
Pharmacist Chemotherapy Monitoring - Initial Assessment    Anticipated start date: 07/25/23   The following has been reviewed per standard work regarding the patient's treatment regimen: The patient's diagnosis, treatment plan and drug doses, and organ/hematologic function Lab orders and baseline tests specific to treatment regimen  The treatment plan start date, drug sequencing, and pre-medications Prior authorization status  Patient's documented medication list, including drug-drug interaction screen and prescriptions for anti-emetics and supportive care specific to the treatment regimen The drug concentrations, fluid compatibility, administration routes, and timing of the medications to be used The patient's access for treatment and lifetime cumulative dose history, if applicable  The patient's medication allergies and previous infusion related reactions, if applicable   Changes made to treatment plan:  N/A  Follow up needed:  Pending authorization for treatment    Daylene Katayama, [facility], 07/18/2023  2:41 PM

## 2023-07-19 ENCOUNTER — Ambulatory Visit
Admission: RE | Admit: 2023-07-19 | Discharge: 2023-07-19 | Disposition: A | Discharge status: Home / Self Care | Payer: 59 | Source: Ambulatory Visit | Attending: Radiation Oncology | Admitting: Radiation Oncology

## 2023-07-19 ENCOUNTER — Encounter (HOSPITAL_COMMUNITY): Payer: Self-pay | Admitting: General Surgery

## 2023-07-19 ENCOUNTER — Other Ambulatory Visit: Payer: Self-pay

## 2023-07-19 ENCOUNTER — Ambulatory Visit: Payer: 59

## 2023-07-19 ENCOUNTER — Encounter (HOSPITAL_COMMUNITY): Payer: Self-pay | Admitting: Physician Assistant

## 2023-07-19 ENCOUNTER — Encounter: Payer: Self-pay | Admitting: Radiation Oncology

## 2023-07-19 VITALS — BP 137/66 | HR 60 | Temp 97.4°F | Resp 20 | Ht 66.0 in | Wt 215.6 lb

## 2023-07-19 DIAGNOSIS — Z803 Family history of malignant neoplasm of breast: Secondary | ICD-10-CM | POA: Insufficient documentation

## 2023-07-19 DIAGNOSIS — G4730 Sleep apnea, unspecified: Secondary | ICD-10-CM | POA: Diagnosis not present

## 2023-07-19 DIAGNOSIS — C781 Secondary malignant neoplasm of mediastinum: Secondary | ICD-10-CM | POA: Insufficient documentation

## 2023-07-19 DIAGNOSIS — C50611 Malignant neoplasm of axillary tail of right female breast: Secondary | ICD-10-CM | POA: Diagnosis not present

## 2023-07-19 DIAGNOSIS — Z79811 Long term (current) use of aromatase inhibitors: Secondary | ICD-10-CM | POA: Diagnosis not present

## 2023-07-19 DIAGNOSIS — Z79899 Other long term (current) drug therapy: Secondary | ICD-10-CM | POA: Diagnosis not present

## 2023-07-19 DIAGNOSIS — C771 Secondary and unspecified malignant neoplasm of intrathoracic lymph nodes: Secondary | ICD-10-CM

## 2023-07-19 DIAGNOSIS — K769 Liver disease, unspecified: Secondary | ICD-10-CM | POA: Insufficient documentation

## 2023-07-19 DIAGNOSIS — Z9221 Personal history of antineoplastic chemotherapy: Secondary | ICD-10-CM | POA: Insufficient documentation

## 2023-07-19 DIAGNOSIS — Z510 Encounter for antineoplastic radiation therapy: Secondary | ICD-10-CM | POA: Diagnosis not present

## 2023-07-19 DIAGNOSIS — I3139 Other pericardial effusion (noninflammatory): Secondary | ICD-10-CM | POA: Insufficient documentation

## 2023-07-19 DIAGNOSIS — Z950 Presence of cardiac pacemaker: Secondary | ICD-10-CM | POA: Diagnosis not present

## 2023-07-19 DIAGNOSIS — Z853 Personal history of malignant neoplasm of breast: Secondary | ICD-10-CM | POA: Insufficient documentation

## 2023-07-19 DIAGNOSIS — C801 Malignant (primary) neoplasm, unspecified: Secondary | ICD-10-CM | POA: Insufficient documentation

## 2023-07-19 DIAGNOSIS — Z923 Personal history of irradiation: Secondary | ICD-10-CM | POA: Insufficient documentation

## 2023-07-19 DIAGNOSIS — I700 Atherosclerosis of aorta: Secondary | ICD-10-CM | POA: Diagnosis not present

## 2023-07-19 NOTE — Anesthesia Preprocedure Evaluation (Signed)
Anesthesia Evaluation    Airway        Dental   Pulmonary           Cardiovascular hypertension,      Neuro/Psych    GI/Hepatic   Endo/Other    Renal/GU      Musculoskeletal   Abdominal   Peds  Hematology   Anesthesia Other Findings   Reproductive/Obstetrics                              Anesthesia Physical Anesthesia Plan  ASA:   Anesthesia Plan:    Post-op Pain Management:    Induction:   PONV Risk Score and Plan:   Airway Management Planned:   Additional Equipment:   Intra-op Plan:   Post-operative Plan:   Informed Consent:   Plan Discussed with:   Anesthesia Plan Comments: (PAT note by Antionette Poles, PA-C: 52 yo female follows with cardiologist Dr. 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 Last seen 05/25/23, stable at that time, PPM functioning normally, 100% ventricular pacing.   Follows with oncologist Dr. Pamelia Hoit for history of metastatic breast cancer. S/p left breast cancer s/p lumpectomy and chemoradiation in 2013 with relapse/recurrence in 2018 with right axillary LN biopsy showing metastatic high-grade carcinoma. Recent PET scan showing anterior mediastinal mass is hypermetabolic and centrally necrotic, soft tissue lesions anterior to the heart, 10 mm pleural-based lesion, level 2 cervical nodes, hypermetabolism hepatic dome mottled FDG accumulation throughout the skeletal system   Pt will need DOS labs and eval.  Limited echo 04/29/22 (copy on chart): Summary: 1. Left ventricle: Systolic function was normal. The estimated ejection fraction was in the range of 55-65%. Images were inadequate for LV wall motion assesment. 2. Pericardium, extracardiac: There is no pericardial effusion  )         Anesthesia Quick Evaluation

## 2023-07-19 NOTE — Progress Notes (Signed)
PCP - Dr. Merleen Milliner [facility]) Cardiologist - Dr. Georgena Spurling [facility]) Oncologist- Dr.V. Pamelia Hoit  PPM/ICD - Pacemaker ( did speak with Amy at [facility] she was going to send their team a message for orders recommendations for procedure 07-20-23) Device Orders -  Rep Notified -   Chest x-ray - 05-31-23 EKG -  Stress Test -  ECHO - 10-22-11 Cardiac Cath -   CPAP - Yes does not wear every night per patient  DM- Denies  Blood Thinner Instructions: Denies Aspirin Instructions: n/a  ERAS Protcol - Clear liquids until 11:45 a.m.  COVID TEST- n/a  Anesthesia review: yes cardiac hx, pacemaker  Patient verbally denies any shortness of breath, fever, cough and chest pain during phone call   -------------  SDW INSTRUCTIONS given:  Your procedure is scheduled on Sept. 25, 2024.  Report to [facility] Main Entrance "A" at 12:15 P.M., and check in at the Admitting office.  Call this number if you have problems the morning of surgery:  [phone]   Remember:  Do not eat after midnight the night before your surgery  You may drink clear liquids until 11:45 the morning of your surgery.   Clear liquids allowed are: Water, Non-Citrus Juices (without pulp), Carbonated Beverages, Clear Tea, Black Coffee Only, and Gatorade    Take these medicines the morning of surgery with A SIP OF WATER  amLODipine (NORVASC)    IF NEEDED acetaminophen (TYLENOL)  carisoprodol (SOMA)  loratadine (CLARITIN)   As of today, STOP taking any Aspirin (unless otherwise instructed by your surgeon) Aleve, Naproxen, Ibuprofen, Motrin, Advil, Goody's, BC's, all herbal medications, fish oil, and all vitamins.                      Do not wear jewelry, make up, or nail polish            Do not wear lotions, powders, perfumes/colognes, or deodorant.            Do not shave 48 hours prior to surgery.  Men may shave face and neck.            Do not bring valuables to the hospital.            [facility] is not responsible for any belongings or valuables.  Do NOT Smoke (Tobacco/Vaping) 24 hours prior to your procedure If you use a CPAP at night, you may bring all equipment for your overnight stay.   Contacts, glasses, dentures or bridgework may not be worn into surgery.      For patients admitted to the hospital, discharge time will be determined by your treatment team.   Patients discharged the day of surgery will not be allowed to drive home, and someone needs to stay with them for 24 hours.    Special instructions:   [facility]- Preparing For Surgery  Before surgery, you can play an important role. Because skin is not sterile, your skin needs to be as free of germs as possible. You can reduce the number of germs on your skin by washing with CHG (chlorahexidine gluconate) Soap before surgery.  CHG is an antiseptic cleaner which kills germs and bonds with the skin to continue killing germs even after washing.    Oral Hygiene is also important to reduce your risk of infection.  Remember - BRUSH YOUR TEETH THE MORNING OF SURGERY WITH YOUR REGULAR TOOTHPASTE  Please do not use if you have an allergy to CHG  or antibacterial soaps. If your skin becomes reddened/irritated stop using the CHG.  Do not shave (including legs and underarms) for at least 48 hours prior to first CHG shower. It is OK to shave your face.  Please follow these instructions carefully.   Shower the NIGHT BEFORE SURGERY and the MORNING OF SURGERY with DIAL Soap.   Pat yourself dry with a CLEAN TOWEL.  Wear CLEAN PAJAMAS to bed the night before surgery  Place CLEAN SHEETS on your bed the night of your first shower and DO NOT SLEEP WITH PETS.   Day of Surgery: Please shower morning of surgery  Wear Clean/Comfortable clothing the morning of surgery Do not apply any deodorants/lotions.   Remember to brush your teeth WITH YOUR REGULAR TOOTHPASTE.   Questions were answered. Patient verbalized understanding of  instructions.

## 2023-07-19 NOTE — Progress Notes (Signed)
Anesthesia Chart Review: Same day workup  52 yo female follows with cardiologist Dr. 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 Last seen 05/25/23, stable at that time, PPM functioning normally, 100% ventricular pacing.   Follows with oncologist Dr. Pamelia Hoit for history of metastatic breast cancer. S/p left breast cancer s/p lumpectomy and chemoradiation in 2013 with relapse/recurrence in 2018 with right axillary LN biopsy showing metastatic high-grade carcinoma. Recent PET scan showing anterior mediastinal mass is hypermetabolic and centrally necrotic, soft tissue lesions anterior to the heart, 10 mm pleural-based lesion, level 2 cervical nodes, hypermetabolism hepatic dome mottled FDG accumulation throughout the skeletal system   Pt will need DOS labs and eval.  Limited echo 04/29/22 (copy on chart): Summary: Left ventricle: Systolic function was normal. The estimated ejection fraction was in the range of 55-65%. Images were inadequate for LV wall motion assesment. Pericardium, extracardiac: There is no pericardial effusion   Zannie Cove [facility] Short Stay Center/Anesthesiology Phone [phone] 07/19/2023 2:00 PM

## 2023-07-19 NOTE — Progress Notes (Signed)
PCP - Dr. Merleen [facility] ([facility] Cardiologist -  Dr. Emilie Rutter [facility])  PPM/ICD - Marcos Eke (spoke with Amy at [facility] she is sending a message to their team for orders/recommendation)  Device Orders -  Rep Notified -   Chest x-ray - 05-31-23 EKG -  Stress Test -  ECHO - 10-22-11 Cardiac Cath -   CPAP - Yes wears most nights per patient  DM- denies  Blood Thinner Instructions: denies Aspirin Instructions: n/a  ERAS Protcol - clear liquids until 11:45 a.m.  COVID TEST- n/a  Anesthesia review: yes  Patient verbally denies any shortness of breath, fever, cough and chest pain during phone call   -------------  SDW INSTRUCTIONS given:  Your procedure is scheduled on sept. 25, 2024.  Report to [facility] "A" at 12:15 P.M., and check in at the Admitting office.  Call this number if you have problems the morning of surgery:  [phone]   Remember:  Do not eat after midnight the night before your surgery  You may drink clear liquids until 11:45 A.M. the morning of your surgery.   Clear liquids allowed are: Water, Non-Citrus Juices (without pulp), Carbonated Beverages, Clear Tea, Black Coffee Only, and Gatorade    Take these medicines the morning of surgery with A SIP OF WATER  amLODipine (NORVASC)    IF NEEDED acetaminophen (TYLENOL)  carisoprodol (SOMA)  loratadine (CLARITIN)    As of today, STOP taking any Aspirin (unless otherwise instructed by your surgeon) Aleve, Naproxen, Ibuprofen, Motrin, Advil, Goody's, BC's, all herbal medications, fish oil, and all vitamins.                      Do not wear jewelry, make up, or nail polish            Do not wear lotions, powders, perfumes/colognes, or deodorant.            Do not shave 48 hours prior to surgery.  Men may shave face and neck.            Do not bring valuables to the hospital.            [facility] is not responsible for any belongings or valuables.  Do  NOT Smoke (Tobacco/Vaping) 24 hours prior to your procedure If you use a CPAP at night, you may bring all equipment for your overnight stay.   Contacts, glasses, dentures or bridgework may not be worn into surgery.      For patients admitted to the hospital, discharge time will be determined by your treatment team.   Patients discharged the day of surgery will not be allowed to drive home, and someone needs to stay with them for 24 hours.    Special instructions:   [facility]- Preparing For Surgery  Before surgery, you can play an important role. Because skin is not sterile, your skin needs to be as free of germs as possible. You can reduce the number of germs on your skin by washing with CHG (chlorahexidine gluconate) Soap before surgery.  CHG is an antiseptic cleaner which kills germs and bonds with the skin to continue killing germs even after washing.    Oral Hygiene is also important to reduce your risk of infection.  Remember - BRUSH YOUR TEETH THE MORNING OF SURGERY WITH YOUR REGULAR TOOTHPASTE  Please do not use if you have an allergy to CHG or antibacterial soaps. If your skin becomes reddened/irritated stop using  the CHG.  Do not shave (including legs and underarms) for at least 48 hours prior to first CHG shower. It is OK to shave your face.  Please follow these instructions carefully.   Shower the NIGHT BEFORE SURGERY and the MORNING OF SURGERY with DIAL Soap.   Pat yourself dry with a CLEAN TOWEL.  Wear CLEAN PAJAMAS to bed the night before surgery  Place CLEAN SHEETS on your bed the night of your first shower and DO NOT SLEEP WITH PETS.   Day of Surgery: Please shower morning of surgery  Wear Clean/Comfortable clothing the morning of surgery Do not apply any deodorants/lotions.   Remember to brush your teeth WITH YOUR REGULAR TOOTHPASTE.   Questions were answered. Patient verbalized understanding of instructions.

## 2023-07-20 ENCOUNTER — Other Ambulatory Visit: Payer: Self-pay | Admitting: Radiology

## 2023-07-20 ENCOUNTER — Encounter: Payer: Self-pay | Admitting: Radiation Oncology

## 2023-07-20 ENCOUNTER — Ambulatory Visit (HOSPITAL_COMMUNITY): Admission: RE | Admit: 2023-07-20 | Payer: 59 | Source: Home / Self Care | Admitting: General Surgery

## 2023-07-20 ENCOUNTER — Telehealth: Payer: Self-pay

## 2023-07-20 ENCOUNTER — Other Ambulatory Visit: Payer: Self-pay | Admitting: *Deleted

## 2023-07-20 ENCOUNTER — Inpatient Hospital Stay (HOSPITAL_BASED_OUTPATIENT_CLINIC_OR_DEPARTMENT_OTHER): Payer: 59 | Admitting: Hematology and Oncology

## 2023-07-20 ENCOUNTER — Encounter (HOSPITAL_COMMUNITY): Admission: RE | Payer: Self-pay | Source: Home / Self Care

## 2023-07-20 DIAGNOSIS — C771 Secondary and unspecified malignant neoplasm of intrathoracic lymph nodes: Secondary | ICD-10-CM | POA: Diagnosis not present

## 2023-07-20 DIAGNOSIS — C50919 Malignant neoplasm of unspecified site of unspecified female breast: Secondary | ICD-10-CM

## 2023-07-20 DIAGNOSIS — C50012 Malignant neoplasm of nipple and areola, left female breast: Secondary | ICD-10-CM

## 2023-07-20 DIAGNOSIS — C50611 Malignant neoplasm of axillary tail of right female breast: Secondary | ICD-10-CM | POA: Diagnosis not present

## 2023-07-20 DIAGNOSIS — Z171 Estrogen receptor negative status [ER-]: Secondary | ICD-10-CM | POA: Diagnosis not present

## 2023-07-20 HISTORY — DX: Presence of cardiac pacemaker: Z95.0

## 2023-07-20 HISTORY — DX: Sleep apnea, unspecified: G47.30

## 2023-07-20 SURGERY — INSERTION PORT-A-CATH
Anesthesia: General

## 2023-07-20 NOTE — Telephone Encounter (Signed)
RN called to pt to check on status of pregnancy test in lab. She stated she did not go to lab and have one yesterday due to not having a period. Rn educated pt that we still need a pregnancy test. Lab seat placed for Friday , however this may still need to rescheduled. Rn will continue to monitor and make changes as needed.

## 2023-07-20 NOTE — Assessment & Plan Note (Signed)
Prior treatment: Verzinio with letrozole started 06/08/2023-07/20/2023  CT CAP: Increasing mediastinal mass 11.6 cm (used to be 10.8 cm) effacing the heart and involving the sternum, additional mediastinal/pericardial lymph nodes increased right extra lymph node 1.5 cm, nodules 0.2 cm (used to be 0.8 cm), few new scattered lung nodules)   Treatment plan: [name] to start 07/25/2023 Palliative radiation to the chest may start 08/15/2023 Patient is going on vacation to [facility] 08/07/2023 to 08/14/2023  We will do her first treatment through peripheral IV since the port is going to be placed per interventional radiology on 08/01/2023.

## 2023-07-20 NOTE — Progress Notes (Signed)
HEMATOLOGY-ONCOLOGY TELEPHONE VISIT PROGRESS NOTE  I connected with our patient on 07/20/23 at  2:45 PM EDT by telephone and verified that I am speaking with the correct person using two identifiers.  I discussed the limitations, risks, security and privacy concerns of performing an evaluation and management service by telephone and the availability of in person appointments.  I also discussed with the patient that there may be a patient responsible charge related to this service. The patient expressed understanding and agreed to proceed.   History of Present Illness: Phone follow-up to discuss the treatment plan.  Patient saw Dr. Basilio Cairo and discussed palliative radiation therapy.  She would like to go to [facility] and come back to start the radiation.  Port placement was canceled by Dr. Dwain Sarna and she is now undergoing port placement through interventional radiology.  So she will start her first cycle of chemo through peripheral IV.    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)  06/30/2011 Initial Diagnosis   Breast cancer, IDC, Left, Stage III, Triple negative, 7.6 cm breast mass and palpable axillary mass   09/06/2011 Miscellaneous   BRCA 1 and 2: Negative   09/14/2011 - 11/23/2011 Neo-Adjuvant Chemotherapy   Dose dense FEC followed by dose dense Taxotere   12/21/2011 Surgery   Left lumpectomy: High-grade poorly differentiated IDC 1.7 cm with high-grade DCIS, margins negative, 3/7 lymph nodes positive, ER 0%, PR 0%, HER-2 negative ratio 1.33 T1CN1 stage IIb   12/31/2011 - 02/15/2012 Radiation Therapy   Radiation at [facility]   09/26/2017 Relapse/Recurrence   Left breast upper outer quadrant within the lumpectomy bed: Fibrosis no malignancy, right axillary lymph node biopsy metastatic high-grade carcinoma ER 0%, PR 0%, Ki-67 90%, HER-2 negative ratio 1.11 (similar to previous ductal carcinoma)   11/14/2017 - 03/06/2018 Neo-Adjuvant Chemotherapy   Neo-Adjuvant  chemotherapy with Gemzar and carboplatin days 1 and 8 every 3 weeks   01/10/2018 Genetic Testing   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  The Hereditary Gene Panel offered by [facility] includes sequencing and/or deletion duplication testing of the following 47 genes: APC, ATM, AXIN2, BARD1, BMPR1A, BRCA1, BRCA2, BRIP1, CDH1, CDK4, CDKN2A (p14ARF), CDKN2A (p16INK4a), CHEK2, CTNNA1, DICER1, EPCAM (Deletion/duplication testing only), GREM1 (promoter region deletion/duplication testing only), KIT, MEN1, MLH1, MSH2, MSH3, MSH6, MUTYH, NBN, NF1, NHTL1, PALB2, PDGFRA, PMS2, POLD1, POLE, PTEN, RAD50, RAD51C, RAD51D, SDHB, SDHC, SDHD, SMAD4, SMARCA4. STK11, TP53, TSC1, TSC2, and VHL.  The following genes were evaluated for sequence changes only: SDHA and HOXB13 c.251G>A variant only. The report date is January 10, 2018.    05/31/2023 Procedure   Mediastinal mass biopsy: Metastatic poorly differentiated carcinoma compatible with breast primary ER +60%, PR 0%, HER2 0, Ki-67 40%    06/02/2023 PET scan   PET/CT Anterior mediastinal mass is hypermetabolic and centrally necrotic, soft tissue lesions anterior to the heart, 10 mm pleural-based lesion, level 2 cervical nodes, hypermetabolism hepatic dome mottled FDG accumulation throughout the skeletal system    06/06/2023 - 07/14/2023 Anti-estrogen oral therapy   Antiestrogen therapy with Verzenio and letrozole    07/07/2023 Relapse/Recurrence   Increasing mediastinal mass 11.6 cm (used to be 10.8 cm) effacing the heart and involving the sternum, additional mediastinal/pericardial lymph nodes increased right extra lymph node 1.5 cm, nodules 0.2 cm (used to be 0.8 cm), few new scattered lung nodules)   07/25/2023 -  Chemotherapy   Patient is on Treatment Plan : BREAST METASTATIC Sacituzumab govitecan-hziy [name]) D1,8  q21d     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)  04/06/2018 Surgery   Right axillary lymph node  dissection: 1/11 node positive for metastatic high-grade carcinoma   04/25/2018 Initial Diagnosis   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) Prior treatment: Verzinio with letrozole started 06/08/2023-07/20/2023  CT CAP: Increasing mediastinal mass 11.6 cm (used to be 10.8 cm) effacing the heart and involving the sternum, additional mediastinal/pericardial lymph nodes increased right extra lymph node 1.5 cm, nodules 0.2 cm (used to be 0.8 cm), few new scattered lung nodules)   Treatment plan: [name] to start 07/25/2023 Palliative radiation to the chest may start 08/15/2023 Patient is going on vacation to [facility] 08/07/2023 to 08/14/2023  We will do her first treatment through peripheral IV since the port is going to be placed per interventional radiology on 08/01/2023.    I discussed the assessment and treatment plan with the patient. The patient was provided an opportunity to ask questions and all were answered. The patient agreed with the plan and demonstrated an understanding of the instructions. The patient was advised to call back or seek an in-person evaluation if the symptoms worsen or if the condition fails to improve as anticipated.   I provided 12 minutes of non-face-to-face time during this encounter.  This includes time for charting and coordination of care   Tamsen Meek, MD

## 2023-07-21 ENCOUNTER — Other Ambulatory Visit: Payer: Self-pay | Admitting: Radiology

## 2023-07-21 ENCOUNTER — Inpatient Hospital Stay: Payer: 59

## 2023-07-21 DIAGNOSIS — C771 Secondary and unspecified malignant neoplasm of intrathoracic lymph nodes: Secondary | ICD-10-CM

## 2023-07-21 NOTE — Progress Notes (Signed)
[facility] Clinical Social Work  Clinical Social Work was referred by medical provider for assessment of psychosocial needs.  Clinical Social Worker contacted patient by phone to offer support and assess for needs. CSW and patient discussed emotional and social needs at time and impact of treatment. CSW and patient discussed financial impact of treatment and available resources. Follow up appointment scheduled.     Marguerita Merles, LCSWA Clinical Social Worker [facility]

## 2023-07-22 ENCOUNTER — Ambulatory Visit: Payer: 59

## 2023-07-22 ENCOUNTER — Encounter: Payer: Self-pay | Admitting: Hematology and Oncology

## 2023-07-22 ENCOUNTER — Other Ambulatory Visit: Payer: Self-pay | Admitting: Hematology and Oncology

## 2023-07-22 ENCOUNTER — Inpatient Hospital Stay: Payer: 59

## 2023-07-22 MED ORDER — TRAMADOL HCL 50 MG PO TABS: 50.0000 mg | ORAL_TABLET | Freq: Four times a day (QID) | ORAL | 0 refills | Status: DC | PRN

## 2023-07-22 MED FILL — Fosaprepitant Dimeglumine For IV Infusion 150 MG (Base Eq): INTRAVENOUS | Qty: 5 | Status: AC

## 2023-07-22 MED FILL — Dexamethasone Sodium Phosphate Inj 100 MG/10ML: INTRAMUSCULAR | Qty: 1 | Status: AC

## 2023-07-22 NOTE — Progress Notes (Signed)
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 Her itching, nausea could be adverse effects from codeine. She is willing to try tramadol and keep [place] posted.

## 2023-07-22 NOTE — Progress Notes (Signed)
Called pt to introduce myself as her Dance movement psychotherapist and to discuss the [employer].  Pt would like to apply so she will bring her proof of income on 07/25/23.  If she's approved I will give her an expense sheet and my card for any questions or concerns she may have in the future.

## 2023-07-25 ENCOUNTER — Encounter: Payer: Self-pay | Admitting: General Practice

## 2023-07-25 ENCOUNTER — Inpatient Hospital Stay: Payer: 59

## 2023-07-25 ENCOUNTER — Other Ambulatory Visit: Payer: 59

## 2023-07-25 ENCOUNTER — Encounter: Payer: Self-pay | Admitting: Hematology and Oncology

## 2023-07-25 VITALS — BP 157/89 | HR 104 | Temp 98.2°F | Resp 17 | Wt 217.5 lb

## 2023-07-25 DIAGNOSIS — C771 Secondary and unspecified malignant neoplasm of intrathoracic lymph nodes: Secondary | ICD-10-CM

## 2023-07-25 DIAGNOSIS — C50012 Malignant neoplasm of nipple and areola, left female breast: Secondary | ICD-10-CM | POA: Diagnosis not present

## 2023-07-25 DIAGNOSIS — N6331 Unspecified lump in axillary tail of the right breast: Secondary | ICD-10-CM

## 2023-07-25 DIAGNOSIS — C50919 Malignant neoplasm of unspecified site of unspecified female breast: Secondary | ICD-10-CM

## 2023-07-25 LAB — PREGNANCY, URINE: Preg Test, Ur: NEGATIVE

## 2023-07-25 LAB — CBC WITH DIFFERENTIAL (CANCER CENTER ONLY)
Abs Immature Granulocytes: 0.03 10*3/uL (ref 0.00–0.07)
Basophils Absolute: 0 10*3/uL (ref 0.0–0.1)
Basophils Relative: 1 %
Eosinophils Absolute: 0 10*3/uL (ref 0.0–0.5)
Eosinophils Relative: 1 %
HCT: 32.3 % — ABNORMAL LOW (ref 36.0–46.0)
Hemoglobin: 11 g/dL — ABNORMAL LOW (ref 12.0–15.0)
Immature Granulocytes: 1 %
Lymphocytes Relative: 35 %
Lymphs Abs: 1.1 10*3/uL (ref 0.7–4.0)
MCH: 28.9 pg (ref 26.0–34.0)
MCHC: 34.1 g/dL (ref 30.0–36.0)
MCV: 85 fL (ref 80.0–100.0)
Monocytes Absolute: 0.2 10*3/uL (ref 0.1–1.0)
Monocytes Relative: 7 %
Neutro Abs: 1.7 10*3/uL (ref 1.7–7.7)
Neutrophils Relative %: 55 %
Platelet Count: 219 10*3/uL (ref 150–400)
RBC: 3.8 MIL/uL — ABNORMAL LOW (ref 3.87–5.11)
RDW: 15.9 % — ABNORMAL HIGH (ref 11.5–15.5)
WBC Count: 3.1 10*3/uL — ABNORMAL LOW (ref 4.0–10.5)
nRBC: 0 % (ref 0.0–0.2)

## 2023-07-25 LAB — CMP (CANCER CENTER ONLY)
ALT: 17 U/L (ref 0–44)
AST: 17 U/L (ref 15–41)
Albumin: 4.4 g/dL (ref 3.5–5.0)
Alkaline Phosphatase: 86 U/L (ref 38–126)
Anion gap: 9 (ref 5–15)
BUN: 17 mg/dL (ref 6–20)
CO2: 29 mmol/L (ref 22–32)
Calcium: 9.9 mg/dL (ref 8.9–10.3)
Chloride: 99 mmol/L (ref 98–111)
Creatinine: 1.01 mg/dL — ABNORMAL HIGH (ref 0.44–1.00)
GFR, Estimated: >60 mL/min (ref >60)
Glucose, Bld: 125 mg/dL — ABNORMAL HIGH (ref 70–99)
Potassium: 3.1 mmol/L — ABNORMAL LOW (ref 3.5–5.1)
Sodium: 137 mmol/L (ref 135–145)
Total Bilirubin: 0.4 mg/dL (ref 0.3–1.2)
Total Protein: 8.2 g/dL — ABNORMAL HIGH (ref 6.5–8.1)

## 2023-07-25 LAB — PHOSPHORUS: Phosphorus: 3.5 mg/dL (ref 2.5–4.6)

## 2023-07-25 LAB — MAGNESIUM: Magnesium: 1.6 mg/dL — ABNORMAL LOW (ref 1.7–2.4)

## 2023-07-25 MED ORDER — PALONOSETRON HCL INJECTION 0.25 MG/5ML
0.2500 mg | Freq: Once | INTRAVENOUS | Status: AC
  Administered 2023-07-25: 0.25 mg via INTRAVENOUS
  Filled 2023-07-25: qty 5

## 2023-07-25 MED ORDER — SODIUM CHLORIDE 0.9 % IV SOLN
150.0000 mg | Freq: Once | INTRAVENOUS | Status: AC
  Administered 2023-07-25: 150 mg via INTRAVENOUS
  Filled 2023-07-25: qty 150

## 2023-07-25 MED ORDER — DIPHENHYDRAMINE HCL 50 MG/ML IJ SOLN
50.0000 mg | Freq: Once | INTRAMUSCULAR | Status: AC
  Administered 2023-07-25: 50 mg via INTRAVENOUS
  Filled 2023-07-25: qty 1

## 2023-07-25 MED ORDER — ATROPINE SULFATE 1 MG/ML IV SOLN
0.5000 mg | Freq: Once | INTRAVENOUS | Status: DC | PRN
  Filled 2023-07-25: qty 1

## 2023-07-25 MED ORDER — SODIUM CHLORIDE 0.9 % IV SOLN: Freq: Once | INTRAVENOUS | Status: AC

## 2023-07-25 MED ORDER — SODIUM CHLORIDE 0.9 % IV SOLN
5.0000 mg/kg | Freq: Once | INTRAVENOUS | Status: AC
  Administered 2023-07-25: 540 mg via INTRAVENOUS
  Filled 2023-07-25: qty 54

## 2023-07-25 MED ORDER — FAMOTIDINE IN NACL 20-0.9 MG/50ML-% IV SOLN
20.0000 mg | Freq: Once | INTRAVENOUS | Status: AC
  Administered 2023-07-25: 20 mg via INTRAVENOUS
  Filled 2023-07-25: qty 50

## 2023-07-25 MED ORDER — SODIUM CHLORIDE 0.9 % IV SOLN
10.0000 mg | Freq: Once | INTRAVENOUS | Status: AC
  Administered 2023-07-25: 10 mg via INTRAVENOUS
  Filled 2023-07-25: qty 10

## 2023-07-25 MED ORDER — ACETAMINOPHEN 325 MG PO TABS
650.0000 mg | ORAL_TABLET | Freq: Once | ORAL | Status: AC
  Administered 2023-07-25: 650 mg via ORAL
  Filled 2023-07-25: qty 2

## 2023-07-25 NOTE — Progress Notes (Signed)
Pt is approved for the $1000 [employer] grant.  

## 2023-07-25 NOTE — Patient Instructions (Signed)
[facility]  Discharge Instructions: Thank you for choosing [facility] to provide your oncology and hematology care.   If you have a lab appointment with the Cancer Center, please go directly to the Cancer Center and check in at the registration area.   Wear comfortable clothing and clothing appropriate for easy access to any Portacath or PICC line.   We strive to give you quality time with your provider. You may need to reschedule your appointment if you arrive late (15 or more minutes).  Arriving late affects you and other patients whose appointments are after yours.  Also, if you miss three or more appointments without notifying the office, you may be dismissed from the clinic at the provider's discretion.      For prescription refill requests, have your pharmacy contact our office and allow 72 hours for refills to be completed.    Today you received the following chemotherapy and/or immunotherapy agents [name]      To help prevent nausea and vomiting after your treatment, we encourage you to take your nausea medication as directed.  BELOW ARE SYMPTOMS THAT SHOULD BE REPORTED IMMEDIATELY: *FEVER GREATER THAN 100.4 F (38 C) OR HIGHER *CHILLS OR SWEATING *NAUSEA AND VOMITING THAT IS NOT CONTROLLED WITH YOUR NAUSEA MEDICATION *UNUSUAL SHORTNESS OF BREATH *UNUSUAL BRUISING OR BLEEDING *URINARY PROBLEMS (pain or burning when urinating, or frequent urination) *BOWEL PROBLEMS (unusual diarrhea, constipation, pain near the anus) TENDERNESS IN MOUTH AND THROAT WITH OR WITHOUT PRESENCE OF ULCERS (sore throat, sores in mouth, or a toothache) UNUSUAL RASH, SWELLING OR PAIN  UNUSUAL VAGINAL DISCHARGE OR ITCHING   Items with * indicate a potential emergency and should be followed up as soon as possible or go to the Emergency Department if any problems should occur.  Please show the CHEMOTHERAPY ALERT CARD or IMMUNOTHERAPY ALERT CARD at  check-in to the Emergency Department and triage nurse.  Should you have questions after your visit or need to cancel or reschedule your appointment, please contact [facility]  Dept: [phone]  and follow the prompts.  Office hours are 8:00 a.m. to 4:30 p.m. Monday - Friday. Please note that voicemails left after 4:00 p.m. may not be returned until the following business day.  We are closed weekends and major holidays. You have access to a nurse at all times for urgent questions. Please call the main number to the clinic Dept: [phone] and follow the prompts.   For any non-urgent questions, you may also contact your provider using MyChart. We now offer e-Visits for anyone 56 and older to request care online for non-urgent symptoms. For details visit [email or web address].   Also download the MyChart app! Go to the app store, search "MyChart", open the app, select [place], and log in with your MyChart username and password.  Sacituzumab Govitecan Injection What is this medication? SACITUZUMAB GOVITECAN (SAK i TOOZ ue mab GOE vi TEE kan) treats breast cancer. It may also be used to treat bladder cancer and kidney cancer. It works by blocking a protein that causes cancer cells to grow and multiply. This helps to slow or stop the spread of cancer cells. This medicine may be used for other purposes; ask your health care provider or pharmacist if you have questions. COMMON BRAND NAME(S): TRODELVY What should I tell my care team before I take this medication? They need to know if you have any of these conditions: Carry the  UGT1A1*28 gene Infection Liver disease An unusual or allergic reaction to sacituzumab govitecan, other medications, foods, dyes, or preservatives Pregnant or trying to get pregnant Breast-feeding How should I use this medication? This medication is injected into a vein. It is given by your care team in a hospital or clinic  setting. Talk to your care team about the use of this medication in children. Special care may be needed. Overdosage: If you think you have taken too much of this medicine contact a poison control center or emergency room at once. NOTE: This medicine is only for you. Do not share this medicine with others. What if I miss a dose? Keep appointments for follow-up doses. It is important not to miss your dose. Call your care team if you are unable to keep an appointment. What may interact with this medication? This medication may affect how other medications work, and other medications may affect the way this medication works. Talk with your care team about all of the medications you take. They may suggest changes to your treatment plan to lower the risk of side effects and to make sure your medications work as intended. This list may not describe all possible interactions. Give your health care provider a list of all the medicines, herbs, non-prescription drugs, or dietary supplements you use. Also tell them if you smoke, drink alcohol, or use illegal drugs. Some items may interact with your medicine. What should I watch for while using this medication? This medication may make you feel generally unwell. This is not uncommon as chemotherapy can affect healthy cells as well as cancer cells. Report any side effects. Continue your course of treatment even though you feel ill unless your care team tells you to stop. You may need blood work while you are taking this medication. Certain genetic factors may decrease the safety of this medication. Your care team may use genetic tests to determine treatment. This medication can cause serious allergic reactions. To reduce your risk, your care team may give you other medications to take before receiving this one. Be sure to follow the directions from your care team. Check with your care team if you have severe diarrhea, nausea, and vomiting, or if you sweat a lot.  The loss of too much body fluid may make it dangerous for you to take this medication. Talk to your care team if you wish to become pregnant or think you might be pregnant. This medication can cause serious birth defects if taken during pregnancy or if you get pregnant within 6 months after stopping treatment. A negative pregnancy test is required before starting this medication. A reliable form of contraception is recommended while taking this medication and for 6 months after stopping treatment. Talk to your care team about reliable forms of contraception. Use a condom during sex and for 3 months after stopping treatment. Tell your care team right away if you think your partner might be pregnant. This medication can cause serious birth defects. Do not breast-feed while taking this medication and for 1 month after stopping therapy. This medication may cause infertility. Talk to your care team if you are concerned about your fertility. This medication may increase your risk of getting an infection. Call your care team for advice if you get a fever, chills, sore throat, or other symptoms of a cold or flu. Do not treat yourself. Try to avoid being around people who are sick. Avoid taking medications that contain aspirin, acetaminophen, ibuprofen, naproxen, or ketoprofen unless instructed  by your care team. These medications may hide a fever. This medication may increase blood sugar. The risk may be higher in patients who already have diabetes. Ask your care team what you can do to lower your risk of diabetes while taking this medication. What side effects may I notice from receiving this medication? Side effects that you should report to your care team as soon as possible: Allergic reactions--skin rash, itching, hives, swelling of the face, lips, tongue, or throat Infection--fever, chills, cough, or sore throat Infusion reactions--chest pain, shortness of breath or trouble breathing, feeling faint or  lightheaded Low red blood cell level--unusual weakness or fatigue, dizziness, headache, trouble breathing Severe or prolonged diarrhea Side effects that usually do not require medical attention (report these to your care team if they continue or are bothersome): Constipation Diarrhea Fatigue Hair loss Loss of appetite Nausea Vomiting This list may not describe all possible side effects. Call your doctor for medical advice about side effects. You may report side effects to FDA at 1-800-FDA-1088. Where should I keep my medication? This medication is given in a hospital or clinic. It will not be stored at home. NOTE: This sheet is a summary. It may not cover all possible information. If you have questions about this medicine, talk to your doctor, pharmacist, or health care provider.  2024 Elsevier/Gold Standard (2022-02-23 00:00:00)

## 2023-07-25 NOTE — Progress Notes (Signed)
[facility] Spiritual Care Note  Following Jessica Simpson for support. Unable to see her in person today in infusion, so sent greetings via RN and plan to follow up by phone this week.   [address] [place], [place], [facility] Pager [phone] Voicemail [phone]

## 2023-07-26 ENCOUNTER — Other Ambulatory Visit: Payer: Self-pay | Admitting: Hematology and Oncology

## 2023-07-26 ENCOUNTER — Encounter: Payer: Self-pay | Admitting: Hematology and Oncology

## 2023-07-26 ENCOUNTER — Encounter: Payer: Self-pay | Admitting: General Practice

## 2023-07-26 ENCOUNTER — Telehealth: Payer: Self-pay

## 2023-07-26 NOTE — Telephone Encounter (Signed)
Jessica Simpson states that she is eating, drinking , and urinating well. She slept last evening when she got home. She has been up all night since 11 pm. She is experiencing constant right neck and shoulder pain. Pain is a 6-7/10. She has applied heat and ice and took a Tramadol with no effect. She took 1500 mg of tylenol at 5 am with no effect. She also states that she has a headache as well. Told her that this message would be forward to Dr. Pamelia Hoit and his nurse to review. Pt verbalized understanding.

## 2023-07-26 NOTE — Telephone Encounter (Signed)
-----   Message from Nurse Julieanne Manson sent at 07/25/2023  4:37 PM EDT ----- Regarding: Pamelia Hoit 1st Tx f/U call - Autumn Messing 1st Tx F/U call - [place].  Tolerated infusion well.

## 2023-07-26 NOTE — Telephone Encounter (Signed)
RN reached back out to cardiology office concerning device clearance for pt pacemaker. Amy stated they had not received this form. Rn refaxed form to office and will await a response back.

## 2023-07-26 NOTE — Progress Notes (Signed)
[facility] Spiritual Care Note  Connected with Jessica Simpson by phone today, learning about some of her treatment story and limitations of her support. In particular, she valued the opportunity to speak with someone who can field the strong feelings associated with diagnosis and treatment vulnerabilities; she sees the value in adding Spiritual Care to her support team, especially after a beloved pastor, a key champion of hers, died after supporting her through a previous treatment period.   Provided rapport-building, introduction to Spiritual Care, emotional support, normalization of feelings, and encouragement to explore support programming (online or while she is in [place] for other appointments). Jessica Simpson has a telephonic counseling appointment set up later in the week with Jessica Simpson/LCSW, as well as direct Spiritual Care number and plans to follow up with chaplain as needed/desired.   [address] [place], [place], [facility] Pager [phone] Voicemail [phone]

## 2023-07-27 ENCOUNTER — Other Ambulatory Visit: Payer: Self-pay | Admitting: Hematology and Oncology

## 2023-07-27 MED ORDER — OXYCODONE-ACETAMINOPHEN 5-325 MG PO TABS
1.0000 | ORAL_TABLET | Freq: Three times a day (TID) | ORAL | 0 refills | Status: DC | PRN
Start: 2023-07-27 — End: 2023-11-02

## 2023-07-27 NOTE — Progress Notes (Signed)
Pain right neck and shoulder: sent for percocet to her pharmacy since tramadol was not working

## 2023-07-28 ENCOUNTER — Other Ambulatory Visit: Payer: Self-pay | Admitting: Radiology

## 2023-07-28 ENCOUNTER — Other Ambulatory Visit (HOSPITAL_COMMUNITY): Payer: Self-pay

## 2023-07-28 ENCOUNTER — Inpatient Hospital Stay: Payer: 59 | Attending: Hematology and Oncology

## 2023-07-28 DIAGNOSIS — Z923 Personal history of irradiation: Secondary | ICD-10-CM | POA: Insufficient documentation

## 2023-07-28 DIAGNOSIS — C50611 Malignant neoplasm of axillary tail of right female breast: Secondary | ICD-10-CM | POA: Insufficient documentation

## 2023-07-28 DIAGNOSIS — Z803 Family history of malignant neoplasm of breast: Secondary | ICD-10-CM | POA: Insufficient documentation

## 2023-07-28 DIAGNOSIS — G4730 Sleep apnea, unspecified: Secondary | ICD-10-CM | POA: Insufficient documentation

## 2023-07-28 DIAGNOSIS — Z510 Encounter for antineoplastic radiation therapy: Secondary | ICD-10-CM | POA: Insufficient documentation

## 2023-07-28 DIAGNOSIS — Z171 Estrogen receptor negative status [ER-]: Secondary | ICD-10-CM | POA: Insufficient documentation

## 2023-07-28 DIAGNOSIS — Z79899 Other long term (current) drug therapy: Secondary | ICD-10-CM | POA: Insufficient documentation

## 2023-07-28 DIAGNOSIS — Z9221 Personal history of antineoplastic chemotherapy: Secondary | ICD-10-CM | POA: Insufficient documentation

## 2023-07-28 DIAGNOSIS — R053 Chronic cough: Secondary | ICD-10-CM | POA: Insufficient documentation

## 2023-07-28 DIAGNOSIS — C771 Secondary and unspecified malignant neoplasm of intrathoracic lymph nodes: Secondary | ICD-10-CM | POA: Insufficient documentation

## 2023-07-28 DIAGNOSIS — R0602 Shortness of breath: Secondary | ICD-10-CM | POA: Insufficient documentation

## 2023-07-28 DIAGNOSIS — I10 Essential (primary) hypertension: Secondary | ICD-10-CM | POA: Insufficient documentation

## 2023-07-28 DIAGNOSIS — C50012 Malignant neoplasm of nipple and areola, left female breast: Secondary | ICD-10-CM | POA: Insufficient documentation

## 2023-07-28 DIAGNOSIS — Z5112 Encounter for antineoplastic immunotherapy: Secondary | ICD-10-CM | POA: Insufficient documentation

## 2023-07-28 DIAGNOSIS — Z801 Family history of malignant neoplasm of trachea, bronchus and lung: Secondary | ICD-10-CM | POA: Insufficient documentation

## 2023-07-28 DIAGNOSIS — R5383 Other fatigue: Secondary | ICD-10-CM | POA: Insufficient documentation

## 2023-07-28 NOTE — Progress Notes (Signed)
[facility] CSW Counseling Note  Patient was referred by self. 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   Affect: Congruent Risk of harm to self or others: No plan to harm self or others  Patient and/or Family's Strengths/Protective Factors: Social connections, Social and Emotional competence, Concrete supports in place (healthy food, safe environments, etc.), and Sense of purposeAbility for insight  Active sense of humor  Average or above average intelligence  Capable of independent living  Communication skills  General fund of knowledge  Motivation for treatment/growth  Special hobby/interest  Supportive family/friends      Goals Addressed: Patient will:  Reduce symptoms of: anxiety Increase knowledge and/or ability of: coping skills, self-management skills, and stress reduction  Increase healthy adjustment to current life circumstances   Progress towards Goals: Initial   Interventions: Interventions utilized:  Solution Focused, Strength-based, and Supportive      Assessment: Patient described experiencing challenges with adjusting with reoccurrence of cancer. Patient's challenges include how third reoccurrence impacts familial and self relationships. Patient described recent event that impacted emotional and hopes for the future.       Plan: Follow up with CSW: 2 weeks Behavioral recommendations: Patient discussed ways that she can exhibit future goals in the present     [place], [place]

## 2023-07-29 ENCOUNTER — Encounter: Payer: Self-pay | Admitting: General Practice

## 2023-07-29 ENCOUNTER — Other Ambulatory Visit (HOSPITAL_COMMUNITY): Payer: Self-pay

## 2023-07-29 MED FILL — Dexamethasone Sodium Phosphate Inj 100 MG/10ML: INTRAMUSCULAR | Qty: 1 | Status: AC

## 2023-07-29 MED FILL — Fosaprepitant Dimeglumine For IV Infusion 150 MG (Base Eq): INTRAVENOUS | Qty: 5 | Status: AC

## 2023-07-29 NOTE — Progress Notes (Signed)
[facility] Spiritual Care Note  Phoned Jessica Simpson to follow up about her treatment experience on Monday, leaving voicemail with encouragement to return call.   [address] [place], [place], [facility] Pager [phone] Voicemail [phone]

## 2023-07-30 NOTE — H&P (Signed)
Referring Physician(s): Serena Croissant  Supervising Physician: Odis Luster  Patient Status:  Jessica Simpson OP  Chief Complaint:  "I'm here for a port a cath"  Subjective: Pt known to IR team from ant mediastinal mass bx on 05/31/23. She is a 52 yo female with PMH HTN, pacemaker, sleep apnea, and metastatic left breast cancer with progressive disease who presents today for port a cath placement to assist with treatment. She denies fever,CP,abd/back pain,N/V or bleeding. She does have HA,dyspnea, cough.    Past Medical History:  Diagnosis Date   BRCA1 negative 10/22/2011   BRCA2 negative 10/22/2011   Breast cancer, IDC, Left, Stage III, Triple negative 06/30/2011   Breast disorder    Contraceptive education 01/15/2016   Family history of breast cancer    Fracture    right 4th toe   History of breast cancer 07/22/2014   History of radiation therapy 05/16/18- 06/22/18   Right Breast/ 50.4 Gy in 28 fractions. Right posterior axilla and SCV nodes/ 50.4 Gy in 28 fractions.    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 She then received a boost to the left lumpectomy of 10 gray in 5 fractions. This was given at [facility].   Sleep apnea    Status post chemotherapy Comp. 11/23/11   FEC and Taxotere   Past Surgical History:  Procedure Laterality Date   anal ascess     turned into a fistula with extensive treatment   AXILLARY LYMPH NODE BIOPSY Right 04/06/2018   AXILLARY LYMPH NODE DISSECTION  Right 04/06/2018   Procedure: RIGHT AXILLARY LYMPH NODE DISSECTION;  Surgeon: Emelia Loron, MD;  Location: [facility] OR;  Service: General;  Laterality: Right;   BREAST BIOPSY Left 2012   BREAST BIOPSY Right 2019   BREAST LUMPECTOMY Left 2012   BREAST LUMPECTOMY Right 2019   rt axilla   BREAST SURGERY     CARDIAC ELECTROPHYSIOLOGY MAPPING AND ABLATION     CESAREAN SECTION     COLONOSCOPY N/A 08/14/2014   Procedure: COLONOSCOPY;  Surgeon: Malissa Hippo, MD;  Location: [facility];  Service: Endoscopy;  Laterality: N/A;  930   EVACUATION BREAST HEMATOMA  12/21/2011   Procedure: EVACUATION HEMATOMA BREAST;  Surgeon: Almond Lint, MD;  Location: [facility] OR;  Service: General;  Laterality: Left;   HAND SURGERY     tumor on finger, left hand   INCISION AND DRAINAGE ABSCESS ANAL     x3   left breast biopsy with axillary biopsy     core bx   PORT-A-CATH REMOVAL  12/21/2011   Procedure: REMOVAL PORT-A-CATH;  Surgeon: Currie Paris, MD;  Location: [facility];  Service: General;  Laterality: N/A;   PORTACATH PLACEMENT  08/02/2011   dr Jamey Ripa   PORTACATH PLACEMENT Right 11/14/2017   Procedure: INSERTION PORT-A-CATH WITH [place];  Surgeon: Emelia Loron, MD;  Location: [facility] OR;  Service: General;  Laterality: Right;   removal breast mass  2004   benign  TREATMENT FISTULA ANAL     TUMOR REMOVAL     on left hand   TUMOR REMOVAL     rt. eye      Allergies: Codeine and Hydrocodone-acetaminophen  Medications: Prior to Admission medications   Medication Sig Start Date End Date Taking? Authorizing Provider  acetaminophen (TYLENOL) 500 MG tablet Take 500 mg by mouth every 6 (six) hours as needed. For pain    [provider]  amLODipine (NORVASC) 5 MG tablet Take 10 mg by mouth daily. 01/05/16   [provider]  carisoprodol (SOMA) 350 MG tablet Take 350 mg by mouth daily as needed for muscle spasms. 05/17/23   [provider]  Cyanocobalamin (VITAMIN  B-12) 5000 MCG TBDP Take 5,000 mcg by mouth daily.    [provider]  ibuprofen (ADVIL) 800 MG tablet Take 1 tablet (800 mg total) by mouth every 8 (eight) hours as needed. 11/24/20   Serena Croissant, MD  lidocaine-prilocaine (EMLA) cream Apply to affected area once Patient not taking: Reported on 07/18/2023 07/14/23   Serena Croissant, MD  loratadine (CLARITIN) 10 MG tablet Take 10 mg by mouth daily as needed for allergies. 11/14/21   [provider]  Omega-3 Fatty Acids (FISH OIL MAXIMUM STRENGTH) 1200 MG CPDR Take 1,200 mg by mouth daily. 12/09/21   [provider]  ondansetron (ZOFRAN) 8 MG tablet Take 1 tablet (8 mg total) by mouth every 8 (eight) hours as needed for nausea or vomiting. 06/29/23   Serena Croissant, MD  ondansetron (ZOFRAN) 8 MG tablet Take 1 tablet (8 mg total) by mouth every 8 (eight) hours as needed for nausea or vomiting. Take 1 tablets (8mg  total) by mouth every 8hrs as needed. Start on the third day after chemotherapy. Patient not taking: Reported on 07/18/2023 07/14/23   Serena Croissant, MD  oxyCODONE-acetaminophen (PERCOCET/ROXICET) 5-325 MG tablet Take 1 tablet by mouth every 8 (eight) hours as needed for severe pain. 07/27/23   Serena Croissant, MD  prochlorperazine (COMPAZINE) 10 MG tablet Take 1 tablet (10 mg total) by mouth every 6 (six) hours as needed for nausea or vomiting. Patient not taking: Reported on 07/19/2023 07/14/23   Serena Croissant, MD  prochlorperazine (COMPAZINE) 5 MG tablet Take 1 tablet (5 mg total) by mouth every 6 (six) hours as needed for nausea or vomiting. Patient not taking: Reported on 07/18/2023 06/29/23   Serena Croissant, MD  traMADol (ULTRAM) 50 MG tablet Take 1 tablet (50 mg total) by mouth every 6 (six) hours as needed. 07/22/23   Rachel Moulds, MD  Vitamin D, Ergocalciferol, (DRISDOL) 1.25 MG (50000 UNIT) CAPS capsule Take 50,000 Units by mouth once a week. 01/17/22   [provider]     Vital Signs: LMP 09/19/2018   Code  Status: FULL CODE  Physical Exam: awake/alert; chest- distant BS on right , left clear; left chest wall pacer; heart- RRR; abd-soft,+BS,NT; no LE edema  Imaging: No results found.  Labs:  CBC: Recent Labs    05/31/23 0831 06/08/23 1144 07/07/23 1118 07/25/23 0937  WBC 5.1 4.3 2.2* 3.1*  HGB 11.6* 12.1 10.3* 11.0*  HCT 36.2 37.6 30.7* 32.3*  PLT 255 257 123* 219    COAGS: Recent Labs    05/31/23 0831  INR 1.0    BMP: Recent Labs    06/08/23 1144 07/07/23 1118 07/25/23 0937  NA 139 138 137  K 3.4* 3.3* 3.1*  CL 100 101 99  CO2 33* 30 29  GLUCOSE 98 87 125*  BUN 14 19 17   CALCIUM 9.6 9.5 9.9  CREATININE 0.92 1.39* 1.01*  GFRNONAA >60 46* >60    LIVER FUNCTION TESTS: Recent Labs    06/08/23 1144 07/07/23 1118 07/25/23 0937  BILITOT 0.5 0.4 0.4  AST 14* 14* 17  ALT 13 17 17   ALKPHOS 111 81 86  PROT 8.1 7.5 8.2*  ALBUMIN 4.4 4.0 4.4    Assessment and Plan: 52 yo female with PMH HTN, pacemaker, sleep apnea, and metastatic left breast cancer with progressive disease who presents today for port a cath placement to assist with treatment. Risks and benefits of image guided port-a-catheter placement was discussed with the patient including, but not limited to bleeding, infection, pneumothorax, or fibrin sheath development and need for additional procedures.  All of the patient's questions were answered, patient is agreeable to proceed. Consent signed and in chart.  Check CXR today due to cough, dyspnea ; pt scheduled for chemotherapy infusion after port placement today  Electronically Signed: D. Jeananne Rama, PA-C 07/30/2023, 5:02 PM   I spent a total of 20 minutes at the the patient's bedside AND on the patient's hospital floor or unit, greater than 50% of which was counseling/coordinating care for port a cath placement

## 2023-08-01 ENCOUNTER — Ambulatory Visit (HOSPITAL_COMMUNITY)
Admission: RE | Admit: 2023-08-01 | Discharge: 2023-08-01 | Disposition: A | Discharge status: Home / Self Care | Payer: 59 | Source: Ambulatory Visit | Attending: Hematology and Oncology | Admitting: Hematology and Oncology

## 2023-08-01 ENCOUNTER — Inpatient Hospital Stay (HOSPITAL_BASED_OUTPATIENT_CLINIC_OR_DEPARTMENT_OTHER): Payer: 59 | Admitting: Hematology and Oncology

## 2023-08-01 ENCOUNTER — Inpatient Hospital Stay: Payer: 59

## 2023-08-01 ENCOUNTER — Telehealth: Payer: Self-pay

## 2023-08-01 ENCOUNTER — Encounter: Payer: Self-pay | Admitting: General Practice

## 2023-08-01 ENCOUNTER — Other Ambulatory Visit: Payer: Self-pay | Admitting: Hematology and Oncology

## 2023-08-01 ENCOUNTER — Encounter (HOSPITAL_COMMUNITY): Payer: Self-pay

## 2023-08-01 ENCOUNTER — Ambulatory Visit (HOSPITAL_COMMUNITY)
Admission: RE | Admit: 2023-08-01 | Discharge: 2023-08-01 | Disposition: A | Discharge status: Home / Self Care | Payer: 59 | Source: Ambulatory Visit | Attending: Radiology | Admitting: Radiology

## 2023-08-01 VITALS — BP 151/79 | HR 111 | Temp 97.8°F | Resp 17 | Wt 214.4 lb

## 2023-08-01 VITALS — BP 157/90 | HR 102 | Temp 98.6°F | Resp 18

## 2023-08-01 DIAGNOSIS — R5383 Other fatigue: Secondary | ICD-10-CM | POA: Diagnosis not present

## 2023-08-01 DIAGNOSIS — R053 Chronic cough: Secondary | ICD-10-CM | POA: Diagnosis not present

## 2023-08-01 DIAGNOSIS — C50012 Malignant neoplasm of nipple and areola, left female breast: Secondary | ICD-10-CM | POA: Diagnosis present

## 2023-08-01 DIAGNOSIS — R059 Cough, unspecified: Secondary | ICD-10-CM | POA: Insufficient documentation

## 2023-08-01 DIAGNOSIS — Z171 Estrogen receptor negative status [ER-]: Secondary | ICD-10-CM | POA: Diagnosis not present

## 2023-08-01 DIAGNOSIS — N6331 Unspecified lump in axillary tail of the right breast: Secondary | ICD-10-CM | POA: Diagnosis not present

## 2023-08-01 DIAGNOSIS — Z803 Family history of malignant neoplasm of breast: Secondary | ICD-10-CM | POA: Diagnosis not present

## 2023-08-01 DIAGNOSIS — R0600 Dyspnea, unspecified: Secondary | ICD-10-CM | POA: Insufficient documentation

## 2023-08-01 DIAGNOSIS — Z923 Personal history of irradiation: Secondary | ICD-10-CM | POA: Diagnosis not present

## 2023-08-01 DIAGNOSIS — C50919 Malignant neoplasm of unspecified site of unspecified female breast: Secondary | ICD-10-CM | POA: Diagnosis not present

## 2023-08-01 DIAGNOSIS — G4730 Sleep apnea, unspecified: Secondary | ICD-10-CM | POA: Insufficient documentation

## 2023-08-01 DIAGNOSIS — Z79899 Other long term (current) drug therapy: Secondary | ICD-10-CM | POA: Diagnosis not present

## 2023-08-01 DIAGNOSIS — R0602 Shortness of breath: Secondary | ICD-10-CM | POA: Diagnosis not present

## 2023-08-01 DIAGNOSIS — Z950 Presence of cardiac pacemaker: Secondary | ICD-10-CM | POA: Insufficient documentation

## 2023-08-01 DIAGNOSIS — Z5112 Encounter for antineoplastic immunotherapy: Secondary | ICD-10-CM | POA: Diagnosis present

## 2023-08-01 DIAGNOSIS — C771 Secondary and unspecified malignant neoplasm of intrathoracic lymph nodes: Secondary | ICD-10-CM | POA: Diagnosis not present

## 2023-08-01 DIAGNOSIS — Z801 Family history of malignant neoplasm of trachea, bronchus and lung: Secondary | ICD-10-CM | POA: Diagnosis not present

## 2023-08-01 DIAGNOSIS — I10 Essential (primary) hypertension: Secondary | ICD-10-CM | POA: Insufficient documentation

## 2023-08-01 DIAGNOSIS — Z9221 Personal history of antineoplastic chemotherapy: Secondary | ICD-10-CM | POA: Diagnosis not present

## 2023-08-01 DIAGNOSIS — Z95828 Presence of other vascular implants and grafts: Secondary | ICD-10-CM

## 2023-08-01 DIAGNOSIS — Z510 Encounter for antineoplastic radiation therapy: Secondary | ICD-10-CM | POA: Diagnosis not present

## 2023-08-01 DIAGNOSIS — C50611 Malignant neoplasm of axillary tail of right female breast: Secondary | ICD-10-CM | POA: Diagnosis not present

## 2023-08-01 HISTORY — DX: Dyspnea, unspecified: R06.00

## 2023-08-01 HISTORY — PX: IR IMAGING GUIDED PORT INSERTION: IMG5740

## 2023-08-01 HISTORY — DX: Cough, unspecified: R05.9

## 2023-08-01 LAB — CMP (CANCER CENTER ONLY)
ALT: 18 U/L (ref 0–44)
AST: 15 U/L (ref 15–41)
Albumin: 4.1 g/dL (ref 3.5–5.0)
Alkaline Phosphatase: 84 U/L (ref 38–126)
Anion gap: 9 (ref 5–15)
BUN: 14 mg/dL (ref 6–20)
CO2: 28 mmol/L (ref 22–32)
Calcium: 9.3 mg/dL (ref 8.9–10.3)
Chloride: 101 mmol/L (ref 98–111)
Creatinine: 0.88 mg/dL (ref 0.44–1.00)
GFR, Estimated: >60 mL/min (ref >60)
Glucose, Bld: 164 mg/dL — ABNORMAL HIGH (ref 70–99)
Potassium: 3.1 mmol/L — ABNORMAL LOW (ref 3.5–5.1)
Sodium: 138 mmol/L (ref 135–145)
Total Bilirubin: 0.3 mg/dL (ref 0.3–1.2)
Total Protein: 7.7 g/dL (ref 6.5–8.1)

## 2023-08-01 LAB — CBC WITH DIFFERENTIAL (CANCER CENTER ONLY)
Abs Immature Granulocytes: 0.06 10*3/uL (ref 0.00–0.07)
Basophils Absolute: 0 10*3/uL (ref 0.0–0.1)
Basophils Relative: 1 %
Eosinophils Absolute: 0 10*3/uL (ref 0.0–0.5)
Eosinophils Relative: 1 %
HCT: 33 % — ABNORMAL LOW (ref 36.0–46.0)
Hemoglobin: 10.8 g/dL — ABNORMAL LOW (ref 12.0–15.0)
Immature Granulocytes: 2 %
Lymphocytes Relative: 36 %
Lymphs Abs: 1.3 10*3/uL (ref 0.7–4.0)
MCH: 28.1 pg (ref 26.0–34.0)
MCHC: 32.7 g/dL (ref 30.0–36.0)
MCV: 85.7 fL (ref 80.0–100.0)
Monocytes Absolute: 0.2 10*3/uL (ref 0.1–1.0)
Monocytes Relative: 6 %
Neutro Abs: 2 10*3/uL (ref 1.7–7.7)
Neutrophils Relative %: 54 %
Platelet Count: 304 10*3/uL (ref 150–400)
RBC: 3.85 MIL/uL — ABNORMAL LOW (ref 3.87–5.11)
RDW: 16.1 % — ABNORMAL HIGH (ref 11.5–15.5)
WBC Count: 3.5 10*3/uL — ABNORMAL LOW (ref 4.0–10.5)
nRBC: 0 % (ref 0.0–0.2)

## 2023-08-01 LAB — MAGNESIUM: Magnesium: 1.6 mg/dL — ABNORMAL LOW (ref 1.7–2.4)

## 2023-08-01 LAB — PHOSPHORUS: Phosphorus: 3.3 mg/dL (ref 2.5–4.6)

## 2023-08-01 MED ORDER — FENTANYL CITRATE (PF) 100 MCG/2ML IJ SOLN
INTRAMUSCULAR | Status: AC | PRN
Start: 2023-08-01 — End: 2023-08-01
  Administered 2023-08-01: 50 ug via INTRAVENOUS

## 2023-08-01 MED ORDER — FAMOTIDINE IN NACL 20-0.9 MG/50ML-% IV SOLN
20.0000 mg | Freq: Once | INTRAVENOUS | Status: AC
  Administered 2023-08-01: 20 mg via INTRAVENOUS
  Filled 2023-08-01: qty 50

## 2023-08-01 MED ORDER — HEPARIN SOD (PORK) LOCK FLUSH 100 UNIT/ML IV SOLN: 500.0000 [IU] | Freq: Once | INTRAVENOUS | Status: DC

## 2023-08-01 MED ORDER — FENTANYL CITRATE (PF) 100 MCG/2ML IJ SOLN
INTRAMUSCULAR | Status: AC
  Filled 2023-08-01: qty 2

## 2023-08-01 MED ORDER — SODIUM CHLORIDE 0.9 % IV SOLN: Freq: Once | INTRAVENOUS | Status: AC

## 2023-08-01 MED ORDER — SODIUM CHLORIDE 0.9 % IV SOLN
10.0000 mg | Freq: Once | INTRAVENOUS | Status: AC
  Administered 2023-08-01: 10 mg via INTRAVENOUS
  Filled 2023-08-01: qty 10

## 2023-08-01 MED ORDER — ACETAMINOPHEN 325 MG PO TABS
650.0000 mg | ORAL_TABLET | Freq: Once | ORAL | Status: AC
  Administered 2023-08-01: 650 mg via ORAL
  Filled 2023-08-01: qty 2

## 2023-08-01 MED ORDER — DIPHENHYDRAMINE HCL 50 MG/ML IJ SOLN
50.0000 mg | Freq: Once | INTRAMUSCULAR | Status: AC
  Administered 2023-08-01: 50 mg via INTRAVENOUS
  Filled 2023-08-01: qty 1

## 2023-08-01 MED ORDER — LIDOCAINE-EPINEPHRINE 1 %-1:100000 IJ SOLN
20.0000 mL | Freq: Once | INTRAMUSCULAR | Status: AC
  Administered 2023-08-01: 17 mL via INTRADERMAL

## 2023-08-01 MED ORDER — HEPARIN SOD (PORK) LOCK FLUSH 100 UNIT/ML IV SOLN
500.0000 [IU] | Freq: Once | INTRAVENOUS | Status: AC | PRN
  Administered 2023-08-01: 500 [IU]

## 2023-08-01 MED ORDER — HEPARIN SOD (PORK) LOCK FLUSH 100 UNIT/ML IV SOLN
INTRAVENOUS | Status: AC
  Filled 2023-08-01: qty 5

## 2023-08-01 MED ORDER — PALONOSETRON HCL INJECTION 0.25 MG/5ML
0.2500 mg | Freq: Once | INTRAVENOUS | Status: AC
  Administered 2023-08-01: 0.25 mg via INTRAVENOUS
  Filled 2023-08-01: qty 5

## 2023-08-01 MED ORDER — SODIUM CHLORIDE 0.9 % IV SOLN
150.0000 mg | Freq: Once | INTRAVENOUS | Status: AC
  Administered 2023-08-01: 150 mg via INTRAVENOUS
  Filled 2023-08-01: qty 150

## 2023-08-01 MED ORDER — SODIUM CHLORIDE 0.9 % IV SOLN
5.0000 mg/kg | Freq: Once | INTRAVENOUS | Status: AC
  Administered 2023-08-01: 540 mg via INTRAVENOUS
  Filled 2023-08-01: qty 54

## 2023-08-01 MED ORDER — SODIUM CHLORIDE 0.9 % IV SOLN: INTRAVENOUS | Status: DC

## 2023-08-01 MED ORDER — SODIUM CHLORIDE 0.9% FLUSH
10.0000 mL | Freq: Once | INTRAVENOUS | Status: AC
  Administered 2023-08-01: 10 mL

## 2023-08-01 MED ORDER — MIDAZOLAM HCL 2 MG/2ML IJ SOLN
INTRAMUSCULAR | Status: AC
  Filled 2023-08-01: qty 4

## 2023-08-01 MED ORDER — MIDAZOLAM HCL 2 MG/2ML IJ SOLN
INTRAMUSCULAR | Status: AC | PRN
Start: 2023-08-01 — End: 2023-08-01
  Administered 2023-08-01: 1 mg via INTRAVENOUS

## 2023-08-01 MED ORDER — SODIUM CHLORIDE 0.9% FLUSH
10.0000 mL | INTRAVENOUS | Status: DC | PRN
  Administered 2023-08-01: 10 mL

## 2023-08-01 MED ORDER — LIDOCAINE-EPINEPHRINE 1 %-1:100000 IJ SOLN
INTRAMUSCULAR | Status: AC
  Filled 2023-08-01: qty 1

## 2023-08-01 NOTE — Progress Notes (Signed)
[facility] Spiritual Care Note  Followed up with Jessica Simpson briefly in infusion so that we could meet in person. She notes that her feelings are understandably up and down, and that she is particularly dreading concurrent chemotherapy and radiation; at the same time, she is working to take each step individually and to live into as much gratitude as possible.  Provided pastoral presence, empathic listening, normalization of feelings, and emotional support. Jessica Nass was appreciative of visit and welcomes a follow-up call next week to discern next Spiritual Care steps.    [address] [place], [place], [facility] Pager [phone] Voicemail [phone]

## 2023-08-01 NOTE — Progress Notes (Signed)
[name] outside of 10% but will continue with 540 mg for now and will re-evaluate prior to next scheduled dose.  Demetrius Charity, PharmD

## 2023-08-01 NOTE — Assessment & Plan Note (Signed)
Prior treatment: Verzinio with letrozole started 06/08/2023-07/20/2023   CT CAP: Increasing mediastinal mass 11.6 cm (used to be 10.8 cm) effacing the heart and involving the sternum, additional mediastinal/pericardial lymph nodes increased right extra lymph node 1.5 cm, nodules 0.2 cm (used to be 0.8 cm), few new scattered lung nodules)    Treatment plan: [name] started 07/25/2023 Palliative radiation to the chest may start 08/15/2023 Patient is going on vacation to [facility] 08/07/2023 to 08/14/2023  Chest discomfort: Secondary to malignancy.  Takes Percocets as needed [name] toxicities:

## 2023-08-01 NOTE — Sedation Documentation (Signed)
Drain and both prior ports were not present on assessment.

## 2023-08-01 NOTE — Progress Notes (Signed)
Per Dr. Pamelia Hoit, ok for treatment today with elevated heart rate and Magnesium 1.6 mg/dL.  Pt. denies complaints of chest pain, dizziness, and no shortness of breath noted. Also, per Dr. Pamelia Hoit ok for pt. to receive next Covid vaccine. Pt. states she will follow up with PCP for vaccine.

## 2023-08-01 NOTE — Patient Instructions (Signed)
[facility] [facility]  Discharge Instructions: Thank you for choosing [facility] to provide your oncology and hematology care.   If you have a lab appointment with the Cancer Center, please go directly to the Cancer Center and check in at the registration area.   Wear comfortable clothing and clothing appropriate for easy access to any Portacath or PICC line.   We strive to give you quality time with your provider. You may need to reschedule your appointment if you arrive late (15 or more minutes).  Arriving late affects you and other patients whose appointments are after yours.  Also, if you miss three or more appointments without notifying the office, you may be dismissed from the clinic at the provider's discretion.      For prescription refill requests, have your pharmacy contact our office and allow 72 hours for refills to be completed.    Today you received the following chemotherapy and/or immunotherapy agent: Sacituzumab Govitecan [name])   To help prevent nausea and vomiting after your treatment, we encourage you to take your nausea medication as directed.  BELOW ARE SYMPTOMS THAT SHOULD BE REPORTED IMMEDIATELY: *FEVER GREATER THAN 100.4 F (38 C) OR HIGHER *CHILLS OR SWEATING *NAUSEA AND VOMITING THAT IS NOT CONTROLLED WITH YOUR NAUSEA MEDICATION *UNUSUAL SHORTNESS OF BREATH *UNUSUAL BRUISING OR BLEEDING *URINARY PROBLEMS (pain or burning when urinating, or frequent urination) *BOWEL PROBLEMS (unusual diarrhea, constipation, pain near the anus) TENDERNESS IN MOUTH AND THROAT WITH OR WITHOUT PRESENCE OF ULCERS (sore throat, sores in mouth, or a toothache) UNUSUAL RASH, SWELLING OR PAIN  UNUSUAL VAGINAL DISCHARGE OR ITCHING   Items with * indicate a potential emergency and should be followed up as soon as possible or go to the Emergency Department if any problems should occur.  Please show the CHEMOTHERAPY ALERT CARD or IMMUNOTHERAPY  ALERT CARD at check-in to the Emergency Department and triage nurse.  Should you have questions after your visit or need to cancel or reschedule your appointment, please contact [place] [facility]  Dept: [phone]  and follow the prompts.  Office hours are 8:00 a.m. to 4:30 p.m. Monday - Friday. Please note that voicemails left after 4:00 p.m. may not be returned until the following business day.  We are closed weekends and major holidays. You have access to a nurse at all times for urgent questions. Please call the main number to the clinic Dept: [phone] and follow the prompts.   For any non-urgent questions, you may also contact your provider using MyChart. We now offer e-Visits for anyone 40 and older to request care online for non-urgent symptoms. For details visit [email or web address].   Also download the MyChart app! Go to the app store, search "MyChart", open the app, select [place], and log in with your MyChart username and password. Sacituzumab Govitecan Injection What is this medication? SACITUZUMAB GOVITECAN (SAK i TOOZ ue mab GOE vi TEE kan) treats breast cancer. It may also be used to treat bladder cancer and kidney cancer. It works by blocking a protein that causes cancer cells to grow and multiply. This helps to slow or stop the spread of cancer cells. This medicine may be used for other purposes; ask your health care provider or pharmacist if you have questions. COMMON BRAND NAME(S): TRODELVY What should I tell my care team before I take this medication? They need to know if you have any of these conditions: Carry the UGT1A1*28 gene  Infection Liver disease An unusual or allergic reaction to sacituzumab govitecan, other medications, foods, dyes, or preservatives Pregnant or trying to get pregnant Breast-feeding How should I use this medication? This medication is injected into a vein. It is given by your care team in a hospital or  clinic setting. Talk to your care team about the use of this medication in children. Special care may be needed. Overdosage: If you think you have taken too much of this medicine contact a poison control center or emergency room at once. NOTE: This medicine is only for you. Do not share this medicine with others. What if I miss a dose? Keep appointments for follow-up doses. It is important not to miss your dose. Call your care team if you are unable to keep an appointment. What may interact with this medication? This medication may affect how other medications work, and other medications may affect the way this medication works. Talk with your care team about all of the medications you take. They may suggest changes to your treatment plan to lower the risk of side effects and to make sure your medications work as intended. This list may not describe all possible interactions. Give your health care provider a list of all the medicines, herbs, non-prescription drugs, or dietary supplements you use. Also tell them if you smoke, drink alcohol, or use illegal drugs. Some items may interact with your medicine. What should I watch for while using this medication? This medication may make you feel generally unwell. This is not uncommon as chemotherapy can affect healthy cells as well as cancer cells. Report any side effects. Continue your course of treatment even though you feel ill unless your care team tells you to stop. You may need blood work while you are taking this medication. Certain genetic factors may decrease the safety of this medication. Your care team may use genetic tests to determine treatment. This medication can cause serious allergic reactions. To reduce your risk, your care team may give you other medications to take before receiving this one. Be sure to follow the directions from your care team. Check with your care team if you have severe diarrhea, nausea, and vomiting, or if you sweat a  lot. The loss of too much body fluid may make it dangerous for you to take this medication. Talk to your care team if you wish to become pregnant or think you might be pregnant. This medication can cause serious birth defects if taken during pregnancy or if you get pregnant within 6 months after stopping treatment. A negative pregnancy test is required before starting this medication. A reliable form of contraception is recommended while taking this medication and for 6 months after stopping treatment. Talk to your care team about reliable forms of contraception. Use a condom during sex and for 3 months after stopping treatment. Tell your care team right away if you think your partner might be pregnant. This medication can cause serious birth defects. Do not breast-feed while taking this medication and for 1 month after stopping therapy. This medication may cause infertility. Talk to your care team if you are concerned about your fertility. This medication may increase your risk of getting an infection. Call your care team for advice if you get a fever, chills, sore throat, or other symptoms of a cold or flu. Do not treat yourself. Try to avoid being around people who are sick. Avoid taking medications that contain aspirin, acetaminophen, ibuprofen, naproxen, or ketoprofen unless instructed by your  care team. These medications may hide a fever. This medication may increase blood sugar. The risk may be higher in patients who already have diabetes. Ask your care team what you can do to lower your risk of diabetes while taking this medication. What side effects may I notice from receiving this medication? Side effects that you should report to your care team as soon as possible: Allergic reactions--skin rash, itching, hives, swelling of the face, lips, tongue, or throat Infection--fever, chills, cough, or sore throat Infusion reactions--chest pain, shortness of breath or trouble breathing, feeling faint or  lightheaded Low red blood cell level--unusual weakness or fatigue, dizziness, headache, trouble breathing Severe or prolonged diarrhea Side effects that usually do not require medical attention (report these to your care team if they continue or are bothersome): Constipation Diarrhea Fatigue Hair loss Loss of appetite Nausea Vomiting This list may not describe all possible side effects. Call your doctor for medical advice about side effects. You may report side effects to FDA at 1-800-FDA-1088. Where should I keep my medication? This medication is given in a hospital or clinic. It will not be stored at home. NOTE: This sheet is a summary. It may not cover all possible information. If you have questions about this medicine, talk to your doctor, pharmacist, or health care provider.  2024 Elsevier/Gold Standard (2022-02-23 00:00:00)

## 2023-08-01 NOTE — Telephone Encounter (Signed)
RN called [employer] rep to arrange for pacemaker interrogation and monitoring per cardiology request. Rn gave Jessica Simpson appt time and dates. Rn or radiation techs will communicate changes with rep. [place] number for contact is [phone].

## 2023-08-01 NOTE — Progress Notes (Signed)
Patient Care Team: Arlina Robes, MD as PCP - General (Family Medicine) Cyndia Bent, MD (Inactive) as Surgeon (General Surgery) Glenford Peers, MD (Hematology and Oncology)  DIAGNOSIS:  Encounter Diagnoses  Name Primary?   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)  06/30/2011 Initial Diagnosis   Breast cancer, IDC, Left, Stage III, Triple negative, 7.6 cm breast mass and palpable axillary mass   09/06/2011 Miscellaneous   BRCA 1 and 2: Negative   09/14/2011 - 11/23/2011 Neo-Adjuvant Chemotherapy   Dose dense FEC followed by dose dense Taxotere   12/21/2011 Surgery   Left lumpectomy: High-grade poorly differentiated IDC 1.7 cm with high-grade DCIS, margins negative, 3/7 lymph nodes positive, ER 0%, PR 0%, HER-2 negative ratio 1.33 T1CN1 stage IIb   12/31/2011 - 02/15/2012 Radiation Therapy   Radiation at [facility]   09/26/2017 Relapse/Recurrence   Left breast upper outer quadrant within the lumpectomy bed: Fibrosis no malignancy, right axillary lymph node biopsy metastatic high-grade carcinoma ER 0%, PR 0%, Ki-67 90%, HER-2 negative ratio 1.11 (similar to previous ductal carcinoma)   11/14/2017 - 03/06/2018 Neo-Adjuvant Chemotherapy   Neo-Adjuvant chemotherapy with Gemzar and carboplatin days 1 and 8 every 3 weeks   01/10/2018 Genetic Testing   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  The Hereditary Gene Panel offered by [employer] includes sequencing and/or deletion duplication testing of the following 47 genes: APC, ATM, AXIN2, BARD1, BMPR1A, BRCA1, BRCA2, BRIP1, CDH1, CDK4, CDKN2A (p14ARF), CDKN2A (p16INK4a), CHEK2, CTNNA1, DICER1, EPCAM (Deletion/duplication testing only), GREM1 (promoter region deletion/duplication testing only),  KIT, MEN1, MLH1, MSH2, MSH3, MSH6, MUTYH, NBN, NF1, NHTL1, PALB2, PDGFRA, PMS2, POLD1, POLE, PTEN, RAD50, RAD51C, RAD51D, SDHB, SDHC, SDHD, SMAD4, SMARCA4. STK11, TP53, TSC1, TSC2, and VHL.  The following genes were evaluated for sequence changes only: SDHA and HOXB13 c.251G>A variant only. The report date is January 10, 2018.    05/31/2023 Procedure   Mediastinal mass biopsy: Metastatic poorly differentiated carcinoma compatible with breast primary ER +60%, PR 0%, HER2 0, Ki-67 40%    06/02/2023 PET scan   PET/CT Anterior mediastinal mass is hypermetabolic and centrally necrotic, soft tissue lesions anterior to the heart, 10 mm pleural-based lesion, level 2 cervical nodes, hypermetabolism hepatic dome mottled FDG accumulation throughout the skeletal system    06/06/2023 - 07/14/2023 Anti-estrogen oral therapy   Antiestrogen therapy with Verzenio and letrozole    07/07/2023 Relapse/Recurrence   Increasing mediastinal mass 11.6 cm (used to be 10.8 cm) effacing the heart and involving the sternum, additional mediastinal/pericardial lymph nodes increased right extra lymph node 1.5 cm, nodules 0.2 cm (used to be 0.8 cm), few new scattered lung nodules)   07/25/2023 -  Chemotherapy   Patient is on Treatment Plan : BREAST METASTATIC Sacituzumab govitecan-hziy [name]) D1,8 q21d     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)  04/06/2018 Surgery   Right axillary lymph node dissection: 1/11 node positive for metastatic high-grade carcinoma   04/25/2018 Initial Diagnosis   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)   04/25/2018 Cancer Staging   Staging form: Breast, AJCC 8th Edition - Clinical: Stage IIB (cT0, cN1, cM0, G3, ER-, PR-, HER2-) - Signed by Lonie Peak, MD on 04/25/2018   05/16/2018 - 06/22/2018 Radiation Therapy   Adjuvant radiation therapy with  Xeloda   07/11/2018 - 10/24/2018 Chemotherapy   Adjuvant chemotherapy with CMF     CHIEF COMPLIANT: Cycle 1 day 8 Trodelvy    History of Present Illness   Jessica Simpson, a patient with a known breast cancer recurrence in mediastinum, reports increased awareness of the mass, but denies pain. She recently started chemotherapy with trodelvy at half the usual dose due to an upcoming trip. She experienced difficulty sleeping the first night after chemotherapy, but did not experience vomiting or any unusual rashes. She did notice her breasts seemed heavier and stretch marks appeared more vibrant, but these symptoms resolved. She also experienced increased skin sensitivity, which she managed with hydrocortisone.  She reports significant fatigue, particularly noticeable when she attempted to go out a few days after chemotherapy. She also experienced a cough and shortness of breath. She had a dental appointment a few days after chemotherapy and experienced significant fatigue, sweating, and breathing difficulties, which alarmed her. She has been encouraged to go out and has noticed some improvement in her energy levels.  She also mentions a desire for Neulasta, which she believes gave her an energy boost in the past, although the doctor clarifies that it does not typically provide energy. She is concerned about the potential for increased fatigue with the upcoming increase in chemotherapy dosage and the addition of radiation therapy.         ALLERGIES:  is allergic to codeine and hydrocodone-acetaminophen.  MEDICATIONS:  Current Outpatient Medications  Medication Sig Dispense Refill   acetaminophen (TYLENOL) 500 MG tablet Take 500 mg by mouth every 6 (six) hours as needed. For pain     amLODipine (NORVASC) 5 MG tablet Take 10 mg by mouth daily.  6   carisoprodol (SOMA) 350 MG tablet Take 350 mg by mouth daily as needed for muscle spasms.     Cyanocobalamin (VITAMIN B-12) 5000 MCG TBDP Take 5,000 mcg by mouth daily.     ibuprofen (ADVIL) 800 MG tablet Take 1 tablet (800 mg total) by mouth every 8 (eight) hours as needed. 30 tablet 0    lidocaine-prilocaine (EMLA) cream Apply to affected area once 30 g 3   loratadine (CLARITIN) 10 MG tablet Take 10 mg by mouth daily as needed for allergies.     Omega-3 Fatty Acids (FISH OIL MAXIMUM STRENGTH) 1200 MG CPDR Take 1,200 mg by mouth daily.     ondansetron (ZOFRAN) 8 MG tablet Take 1 tablet (8 mg total) by mouth every 8 (eight) hours as needed for nausea or vomiting. 20 tablet 0   ondansetron (ZOFRAN) 8 MG tablet Take 1 tablet (8 mg total) by mouth every 8 (eight) hours as needed for nausea or vomiting. Take 1 tablets (8mg  total) by mouth every 8hrs as needed. Start on the third day after chemotherapy. (Patient not taking: Reported on 08/01/2023) 30 tablet 1   oxyCODONE-acetaminophen (PERCOCET/ROXICET) 5-325 MG tablet Take 1 tablet by mouth every 8 (eight) hours as needed for severe pain. (Patient not taking: Reported on 08/01/2023) 30 tablet 0   prochlorperazine (COMPAZINE) 10 MG tablet Take 1 tablet (10 mg total) by mouth every 6 (six) hours as needed for nausea or vomiting. (Patient not taking: Reported on 08/01/2023) 30 tablet 1   prochlorperazine (COMPAZINE) 5 MG tablet Take 1 tablet (5 mg total) by mouth every 6 (six) hours as needed for nausea or vomiting. (Patient not taking: Reported on 08/01/2023) 30 tablet 0   traMADol (ULTRAM) 50 MG tablet Take 1 tablet (50 mg total) by  mouth every 6 (six) hours as needed. (Patient not taking: Reported on 08/01/2023) 30 tablet 0   Vitamin D, Ergocalciferol, (DRISDOL) 1.25 MG (50000 UNIT) CAPS capsule Take 50,000 Units by mouth once a week.     No current facility-administered medications for this visit.   Facility-Administered Medications Ordered in Other Visits  Medication Dose Route Frequency Provider Last Rate Last Admin   0.9 %  sodium chloride infusion   Intravenous Continuous Ardith Dark, NP   Stopped at 08/01/23 1055   heparin lock flush 100 unit/mL  500 Units Intravenous Once Simonne Come, MD       lactated ringers infusion     Continuous PRN Andree Elk, CRNA   New Bag at 11/14/17 0700    PHYSICAL EXAMINATION: ECOG PERFORMANCE STATUS: 1 - Symptomatic but completely ambulatory  Vitals:   08/01/23 1228  BP: (!) 151/79  Pulse: (!) 111  Resp: 17  Temp: 97.8 F (36.6 C)  SpO2: 92%   Filed Weights   08/01/23 1228  Weight: 214 lb 6.4 oz (97.3 kg)      LABORATORY DATA:  I have reviewed the data as listed    Latest Ref Rng & Units 08/01/2023   11:52 AM 07/25/2023    9:37 AM 07/07/2023   11:18 AM  CMP  Glucose 70 - 99 mg/dL 161  096  87   BUN 6 - 20 mg/dL 14  17  19    Creatinine 0.44 - 1.00 mg/dL 0.45  4.09  8.11   Sodium 135 - 145 mmol/L 138  137  138   Potassium 3.5 - 5.1 mmol/L 3.1  3.1  3.3   Chloride 98 - 111 mmol/L 101  99  101   CO2 22 - 32 mmol/L 28  29  30    Calcium 8.9 - 10.3 mg/dL 9.3  9.9  9.5   Total Protein 6.5 - 8.1 g/dL 7.7  8.2  7.5   Total Bilirubin 0.3 - 1.2 mg/dL 0.3  0.4  0.4   Alkaline Phos 38 - 126 U/L 84  86  81   AST 15 - 41 U/L 15  17  14    ALT 0 - 44 U/L 18  17  17      Lab Results  Component Value Date   WBC 3.5 (L) 08/01/2023   HGB 10.8 (L) 08/01/2023   HCT 33.0 (L) 08/01/2023   MCV 85.7 08/01/2023   PLT 304 08/01/2023   NEUTROABS 2.0 08/01/2023    ASSESSMENT & PLAN:  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   Shortness of breath: Again related to the mass.  Chest x-ray done today did not  reveal any abnormality.  [name] toxicities: Severe fatigue Monitoring blood levels  I increase the dosage of Trodelvy with cycle 2 to 7.5 mg/m.  No orders of the defined types were placed in this encounter.  The patient has a good understanding of the overall plan. she agrees with it. she will call with any problems  that may develop before the next visit here. Total time spent: 30 mins including face to face time and time spent for planning, charting and co-ordination of care   Tamsen Meek, MD 08/01/23

## 2023-08-01 NOTE — Procedures (Signed)
Pre Procedure Dx: Poor venous access Post Procedural Dx: Same  Successful placement of right IJ approach port-a-cath with tip at the superior caval atrial junction. The catheter is ready for immediate use.  Estimated Blood Loss: Trace  Complications: None immediate.  Jay Haile Bosler, MD Pager #: [phone]   

## 2023-08-08 ENCOUNTER — Encounter: Payer: Self-pay | Admitting: Hematology and Oncology

## 2023-08-08 ENCOUNTER — Encounter: Payer: Self-pay | Admitting: General Practice

## 2023-08-08 NOTE — Progress Notes (Signed)
[facility] Spiritual Care Note  Jessica Simpson by phone at an inconvenient time; she plans to return call in order to schedule an in-person appointment.   [address] [place], [place], [facility] Pager [phone] Voicemail [phone]

## 2023-08-10 DIAGNOSIS — Z5112 Encounter for antineoplastic immunotherapy: Secondary | ICD-10-CM | POA: Diagnosis not present

## 2023-08-10 DIAGNOSIS — Z510 Encounter for antineoplastic radiation therapy: Secondary | ICD-10-CM | POA: Insufficient documentation

## 2023-08-10 DIAGNOSIS — C771 Secondary and unspecified malignant neoplasm of intrathoracic lymph nodes: Secondary | ICD-10-CM | POA: Insufficient documentation

## 2023-08-10 DIAGNOSIS — C50611 Malignant neoplasm of axillary tail of right female breast: Secondary | ICD-10-CM | POA: Insufficient documentation

## 2023-08-11 ENCOUNTER — Inpatient Hospital Stay: Payer: 59

## 2023-08-11 ENCOUNTER — Telehealth: Payer: Self-pay

## 2023-08-11 NOTE — Telephone Encounter (Signed)
[facility] CSW Progress Note  Clinical Social Worker  attempted to contact patient for scheduled phone counseling appointment. No answer, left vm with direct contact information.     Marguerita Merles, LCSWA Clinical Social Worker [facility]

## 2023-08-12 MED FILL — Fosaprepitant Dimeglumine For IV Infusion 150 MG (Base Eq): INTRAVENOUS | Qty: 5 | Status: AC

## 2023-08-15 ENCOUNTER — Inpatient Hospital Stay (HOSPITAL_BASED_OUTPATIENT_CLINIC_OR_DEPARTMENT_OTHER): Payer: 59 | Admitting: Adult Health

## 2023-08-15 ENCOUNTER — Inpatient Hospital Stay: Payer: 59

## 2023-08-15 ENCOUNTER — Ambulatory Visit
Admission: RE | Admit: 2023-08-15 | Discharge: 2023-08-15 | Disposition: A | Discharge status: Home / Self Care | Payer: 59 | Source: Ambulatory Visit | Attending: Radiation Oncology | Admitting: Radiation Oncology

## 2023-08-15 ENCOUNTER — Other Ambulatory Visit: Payer: Self-pay

## 2023-08-15 ENCOUNTER — Ambulatory Visit
Admission: RE | Admit: 2023-08-15 | Discharge: 2023-08-15 | Disposition: A | Discharge status: Home / Self Care | Payer: 59 | Source: Ambulatory Visit | Attending: Radiation Oncology

## 2023-08-15 ENCOUNTER — Telehealth: Payer: Self-pay

## 2023-08-15 VITALS — BP 152/76 | HR 55 | Resp 16

## 2023-08-15 DIAGNOSIS — Z95828 Presence of other vascular implants and grafts: Secondary | ICD-10-CM

## 2023-08-15 DIAGNOSIS — N6331 Unspecified lump in axillary tail of the right breast: Secondary | ICD-10-CM

## 2023-08-15 DIAGNOSIS — C50919 Malignant neoplasm of unspecified site of unspecified female breast: Secondary | ICD-10-CM

## 2023-08-15 DIAGNOSIS — C771 Secondary and unspecified malignant neoplasm of intrathoracic lymph nodes: Secondary | ICD-10-CM

## 2023-08-15 DIAGNOSIS — Z5112 Encounter for antineoplastic immunotherapy: Secondary | ICD-10-CM | POA: Diagnosis not present

## 2023-08-15 LAB — CBC WITH DIFFERENTIAL (CANCER CENTER ONLY)
Abs Immature Granulocytes: 0.04 10*3/uL (ref 0.00–0.07)
Basophils Absolute: 0 10*3/uL (ref 0.0–0.1)
Basophils Relative: 0 %
Eosinophils Absolute: 0 10*3/uL (ref 0.0–0.5)
Eosinophils Relative: 1 %
HCT: 33.1 % — ABNORMAL LOW (ref 36.0–46.0)
Hemoglobin: 10.7 g/dL — ABNORMAL LOW (ref 12.0–15.0)
Immature Granulocytes: 1 %
Lymphocytes Relative: 16 %
Lymphs Abs: 0.9 10*3/uL (ref 0.7–4.0)
MCH: 27.9 pg (ref 26.0–34.0)
MCHC: 32.3 g/dL (ref 30.0–36.0)
MCV: 86.4 fL (ref 80.0–100.0)
Monocytes Absolute: 0.4 10*3/uL (ref 0.1–1.0)
Monocytes Relative: 8 %
Neutro Abs: 4 10*3/uL (ref 1.7–7.7)
Neutrophils Relative %: 74 %
Platelet Count: 261 10*3/uL (ref 150–400)
RBC: 3.83 MIL/uL — ABNORMAL LOW (ref 3.87–5.11)
RDW: 16.8 % — ABNORMAL HIGH (ref 11.5–15.5)
WBC Count: 5.4 10*3/uL (ref 4.0–10.5)
nRBC: 0 % (ref 0.0–0.2)

## 2023-08-15 LAB — CMP (CANCER CENTER ONLY)
ALT: 21 U/L (ref 0–44)
AST: 16 U/L (ref 15–41)
Albumin: 4.2 g/dL (ref 3.5–5.0)
Alkaline Phosphatase: 84 U/L (ref 38–126)
Anion gap: 10 (ref 5–15)
BUN: 19 mg/dL (ref 6–20)
CO2: 26 mmol/L (ref 22–32)
Calcium: 9.9 mg/dL (ref 8.9–10.3)
Chloride: 103 mmol/L (ref 98–111)
Creatinine: 0.96 mg/dL (ref 0.44–1.00)
GFR, Estimated: >60 mL/min (ref >60)
Glucose, Bld: 110 mg/dL — ABNORMAL HIGH (ref 70–99)
Potassium: 3.5 mmol/L (ref 3.5–5.1)
Sodium: 139 mmol/L (ref 135–145)
Total Bilirubin: 0.4 mg/dL (ref 0.3–1.2)
Total Protein: 7.8 g/dL (ref 6.5–8.1)

## 2023-08-15 LAB — PHOSPHORUS: Phosphorus: 3.3 mg/dL (ref 2.5–4.6)

## 2023-08-15 LAB — RAD ONC ARIA SESSION SUMMARY
Course Elapsed Days: 0
Plan Fractions Treated to Date: 1
Plan Prescribed Dose Per Fraction: 3 Gy
Plan Total Fractions Prescribed: 10
Plan Total Prescribed Dose: 30 Gy
Reference Point Dosage Given to Date: 3 Gy
Reference Point Session Dosage Given: 3 Gy
Session Number: 1

## 2023-08-15 LAB — MAGNESIUM: Magnesium: 1.8 mg/dL (ref 1.7–2.4)

## 2023-08-15 MED ORDER — ACETAMINOPHEN 325 MG PO TABS
650.0000 mg | ORAL_TABLET | Freq: Once | ORAL | Status: AC
  Administered 2023-08-15: 650 mg via ORAL
  Filled 2023-08-15: qty 2

## 2023-08-15 MED ORDER — FOSAPREPITANT DIMEGLUMINE INJECTION 150 MG
150.0000 mg | Freq: Once | INTRAVENOUS | Status: AC
  Administered 2023-08-15: 150 mg via INTRAVENOUS
  Filled 2023-08-15: qty 150

## 2023-08-15 MED ORDER — SODIUM CHLORIDE 0.9% FLUSH
10.0000 mL | Freq: Once | INTRAVENOUS | Status: AC
  Administered 2023-08-15: 10 mL

## 2023-08-15 MED ORDER — DEXAMETHASONE SODIUM PHOSPHATE 10 MG/ML IJ SOLN
10.0000 mg | Freq: Once | INTRAMUSCULAR | Status: AC
  Administered 2023-08-15: 10 mg via INTRAVENOUS
  Filled 2023-08-15: qty 1

## 2023-08-15 MED ORDER — ATROPINE SULFATE 1 MG/ML IV SOLN
0.5000 mg | Freq: Once | INTRAVENOUS | Status: DC | PRN
  Filled 2023-08-15: qty 1

## 2023-08-15 MED ORDER — SODIUM CHLORIDE 0.9 % IV SOLN: Freq: Once | INTRAVENOUS | Status: AC

## 2023-08-15 MED ORDER — PALONOSETRON HCL INJECTION 0.25 MG/5ML
0.2500 mg | Freq: Once | INTRAVENOUS | Status: AC
  Administered 2023-08-15: 0.25 mg via INTRAVENOUS
  Filled 2023-08-15: qty 5

## 2023-08-15 MED ORDER — SACITUZUMAB GOVITECAN-HZIY CHEMO 180 MG IV SOLR
7.5000 mg/kg | Freq: Once | INTRAVENOUS | Status: AC
  Administered 2023-08-15: 720 mg via INTRAVENOUS
  Filled 2023-08-15: qty 72

## 2023-08-15 MED ORDER — DIPHENHYDRAMINE HCL 50 MG/ML IJ SOLN
50.0000 mg | Freq: Once | INTRAMUSCULAR | Status: AC
  Administered 2023-08-15: 50 mg via INTRAVENOUS
  Filled 2023-08-15: qty 1

## 2023-08-15 MED ORDER — HEPARIN SOD (PORK) LOCK FLUSH 100 UNIT/ML IV SOLN
500.0000 [IU] | Freq: Once | INTRAVENOUS | Status: AC | PRN
  Administered 2023-08-15: 500 [IU]

## 2023-08-15 MED ORDER — FAMOTIDINE IN NACL 20-0.9 MG/50ML-% IV SOLN
20.0000 mg | Freq: Once | INTRAVENOUS | Status: AC
  Administered 2023-08-15: 20 mg via INTRAVENOUS
  Filled 2023-08-15: qty 50

## 2023-08-15 MED ORDER — SODIUM CHLORIDE 0.9% FLUSH
10.0000 mL | INTRAVENOUS | Status: DC | PRN
  Administered 2023-08-15: 10 mL

## 2023-08-15 NOTE — Telephone Encounter (Signed)
Rn reached out to [employer] at [facility] to get a good fax number to send pt pacemaker integration report to. Rn also report to Olegario Messier about how pt was feeling overall (tired and fatigued with activity intolerance). Rn will send this report to the cardiology group.

## 2023-08-15 NOTE — Assessment & Plan Note (Signed)
Prior treatment: Verzinio with letrozole started 06/08/2023-07/20/2023   CT CAP: Increasing mediastinal mass 11.6 cm (used to be 10.8 cm) effacing the heart and involving the sternum, additional mediastinal/pericardial lymph nodes increased right extra lymph node 1.5 cm, nodules 0.2 cm (used to be 0.8 cm), few new scattered lung nodules)    Treatment plan: [name] started 07/25/2023 Palliative radiation to the chest 08/15/2023  Fatigue: Recommended continued energy conservation Pacemaker concern: Her heart rate today is slightly decreased at about 55.  She is asymptomatic other than the fatigue that could possibly be related.  We have faxed the report to her cardiologist.  I discussed the case with Dr. Basilio Cairo who noted that while simulating the patient today she noted a slight improvement in her mediastinal mass suggesting an early response to the [facility] therapy.  Considering the extent of her mediastinal mass and the fact that it is working we will proceed with therapy today and the patient will follow-up closely with her cardiologist.  We reviewed red flags for when to go to the emergency room.  I reviewed her CBC with her which is stable.  She we will proceed with [name] as ordered so long as her c-Met is within parameters.  RTC in 1 week for day 8 of therapy and in 3 weeks for cycle 3 day 1 of therapy.

## 2023-08-15 NOTE — Progress Notes (Signed)
[facility] Cancer Follow up:    Arlina Robes, MD [address] 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 KI-67 of 99%    - Pathologic: No stage assigned - Unsigned Specimen type: Core Needle Biopsy Histopathologic type: Infiltrating duct carcinoma, NOS Tumor size (mm): 66 Prognostic indicators: Triple negative. 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)  06/30/2011 Initial Diagnosis   Breast cancer, IDC, Left, Stage III, Triple negative, 7.6 cm breast mass and palpable axillary mass   09/06/2011 Miscellaneous   BRCA 1 and 2: Negative   09/14/2011 - 11/23/2011 Neo-Adjuvant Chemotherapy   Dose dense FEC followed by dose dense Taxotere   12/21/2011 Surgery   Left lumpectomy: High-grade poorly differentiated IDC 1.7 cm with high-grade DCIS, margins negative, 3/7 lymph nodes positive, ER 0%, PR 0%, HER-2 negative ratio 1.33 T1CN1 stage IIb   12/31/2011 - 02/15/2012 Radiation Therapy   Radiation at [facility]   09/26/2017 Relapse/Recurrence   Left breast upper  outer quadrant within the lumpectomy bed: Fibrosis no malignancy, right axillary lymph node biopsy metastatic high-grade carcinoma ER 0%, PR 0%, Ki-67 90%, HER-2 negative ratio 1.11 (similar to previous ductal carcinoma)   11/14/2017 - 03/06/2018 Neo-Adjuvant Chemotherapy   Neo-Adjuvant chemotherapy with Gemzar and carboplatin days 1 and 8 every 3 weeks   01/10/2018 Genetic Testing   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  The Hereditary Gene Panel offered by [facility] includes sequencing and/or deletion duplication testing of the following 47 genes: APC, ATM, AXIN2, BARD1, BMPR1A, BRCA1, BRCA2, BRIP1, CDH1, CDK4, CDKN2A (p14ARF), CDKN2A (p16INK4a), CHEK2, CTNNA1, DICER1, EPCAM (Deletion/duplication testing only), GREM1 (promoter region deletion/duplication testing only), KIT, MEN1, MLH1, MSH2, MSH3, MSH6, MUTYH, NBN, NF1, NHTL1, PALB2, PDGFRA, PMS2, POLD1, POLE, PTEN, RAD50, RAD51C, RAD51D, SDHB, SDHC, SDHD, SMAD4, SMARCA4. STK11, TP53, TSC1, TSC2, and VHL.  The following genes were evaluated for sequence changes only: SDHA and HOXB13 c.251G>A variant only. The report date is January 10, 2018.    05/31/2023 Procedure   Mediastinal mass biopsy: Metastatic poorly differentiated carcinoma compatible with breast primary ER +60%, PR 0%, HER2 0, Ki-67 40%    06/02/2023 PET scan   PET/CT Anterior mediastinal mass is hypermetabolic and centrally necrotic, soft tissue lesions anterior to the heart, 10 mm pleural-based lesion, level 2 cervical nodes, hypermetabolism hepatic dome mottled FDG accumulation throughout the skeletal system    06/06/2023 - 07/14/2023 Anti-estrogen oral therapy   Antiestrogen therapy with Verzenio and letrozole    07/07/2023 Relapse/Recurrence   Increasing mediastinal mass 11.6 cm (used to be 10.8 cm) effacing the heart and involving the sternum, additional mediastinal/pericardial lymph nodes  increased right extra lymph node 1.5 cm, nodules 0.2 cm  (used to be 0.8 cm), few new scattered lung nodules)   07/25/2023 -  Chemotherapy   Patient is on Treatment Plan : BREAST METASTATIC Sacituzumab govitecan-hziy [name]) D1,8 q21d       CURRENT THERAPY: [name]  INTERVAL HISTORY: Donishia Shamp 52 y.o. female returns for follow-up prior to receiving Trodelvy chemotherapy.  She is receiving treatment 5 mg/kg given on days 1 and 8 of a 21-day cycle.  She is being evaluated prior to cycle 2-day 1 and is due to receive dose increase to 7.5 mg/kg.  She was due to begin radiation today with Dr. Basilio Cairo which she proceeded with.  They investigated her pacemaker prior to therapy and noted an abnormality with it.  Radiation oncology nurse Zerita Boers brought me the report.  She faxed this to the patient's cardiologist Dr. Georgena Spurling in [place].  Dennys experiences fatigue that is a daily thing however she denies any chest pain, palpitations, lightheadedness, shortness of breath, dyspnea on exertion, orthopnea, swelling, or any other concerns today.  He has a persistent chronic cough that has been present since her relapse/recurrence was noted.  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) 04/25/2018   Genetic testing 01/12/2018   Family history of breast cancer    Port-A-Cath in place 11/21/2017   Mass of axillary tail of right breast 08/24/2017   Pain with urination 08/24/2017   Urinary frequency  08/24/2017   Hematuria 08/24/2017   Encounter for well woman exam with routine gynecological exam 01/15/2016   Benign cyst of right breast 11/29/2015   Positive fecal occult blood test 07/22/2014   History of breast cancer 07/22/2014   Rectal bleeding 07/10/2014   Lymphedema 06/15/2012   BRCA1 negative 10/22/2011   BRCA2 negative 10/22/2011   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  MEDICAL HISTORY: Past Medical History:  Diagnosis Date   BRCA1 negative 10/22/2011   BRCA2 negative 10/22/2011   Breast cancer, IDC, Left, Stage III, Triple negative 06/30/2011   Breast disorder    Contraceptive education 01/15/2016   Cough    Dyspnea    Family history of breast cancer    Fracture    right 4th toe   History of breast cancer 07/22/2014   History of radiation therapy 05/16/18- 06/22/18   Right Breast/ 50.4 Gy in 28 fractions. Right posterior axilla and SCV nodes/ 50.4 Gy in 28 fractions.    Hypertension    Lymphedema 06/15/2012   Lymphedema of arm    Papanicolaou smear of cervix with positive high risk human papilloma virus (HPV) test 12/25/2020   12/19/2020 repeat pap in 1 year per ASCCP guidelines, 5 year CIN3+risk is 2.25%   Personal history of chemotherapy    Personal history of radiation therapy    Positive fecal occult blood test 07/22/2014   Presence of permanent cardiac pacemaker  S/P radiation therapy 2013   50 gray in 25 fractions to the left breast, supraclavicular, and axillary regions. She then received a boost to the left lumpectomy of 10 gray in 5 fractions. This was given at [facility].   Sleep apnea    Status post chemotherapy Comp. 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   AXILLARY LYMPH NODE  BIOPSY Right 04/06/2018   AXILLARY LYMPH NODE DISSECTION Right 04/06/2018   Procedure: RIGHT AXILLARY LYMPH NODE DISSECTION;  Surgeon: Emelia Loron, MD;  Location: [facility] OR;  Service: General;  Laterality: Right;   BREAST BIOPSY Left 2012   BREAST BIOPSY Right 2019   BREAST LUMPECTOMY Left 2012   BREAST LUMPECTOMY Right 2019   rt axilla   BREAST SURGERY     CARDIAC ELECTROPHYSIOLOGY MAPPING AND ABLATION     CESAREAN SECTION     COLONOSCOPY N/A 08/14/2014   Procedure: COLONOSCOPY;  Surgeon: Malissa Hippo, MD;  Location: [facility];  Service: Endoscopy;  Laterality: N/A;  930   EVACUATION BREAST HEMATOMA  12/21/2011   Procedure: EVACUATION HEMATOMA BREAST;  Surgeon: Almond Lint, MD;  Location: [facility] OR;  Service: General;  Laterality: Left;   HAND SURGERY     tumor on finger, left hand   INCISION AND DRAINAGE ABSCESS ANAL     x3   IR IMAGING GUIDED PORT INSERTION  08/01/2023   left breast biopsy with axillary biopsy     core bx   PORT-A-CATH REMOVAL  12/21/2011   Procedure: REMOVAL PORT-A-CATH;  Surgeon: Currie Paris, MD;  Location: [facility];  Service: General;  Laterality: N/A;   PORTACATH PLACEMENT  08/02/2011   dr Lenord Carbo PLACEMENT Right 11/14/2017   Procedure: INSERTION PORT-A-CATH WITH [place];  Surgeon: Emelia Loron, MD;  Location: [facility] OR;  Service: General;  Laterality: Right;   removal breast mass  2004   benign   TREATMENT FISTULA ANAL     TUMOR REMOVAL     on left hand   TUMOR REMOVAL     rt. eye    SOCIAL HISTORY: Social History   Socioeconomic History   Marital status: Single    Spouse name: Not on file   Number of children: Not on file   Years of education: Not on file   Highest education level: Not on file  Occupational History   Not on file  Tobacco Use   Smoking status: Never   Smokeless tobacco: Never  Vaping Use   Vaping status: Never Used  Substance and Sexual Activity   Alcohol use: No   Drug use: No    Sexual activity: Yes    Birth control/protection: Post-menopausal  Other Topics Concern   Not on file  Social History Narrative   Not on file   Social Determinants of Health   Financial Resource Strain: Low Risk  (01/26/2022)   Overall Financial Resource Strain (CARDIA)    Difficulty of Paying Living Expenses: Not very hard  Food Insecurity: No Food Insecurity (01/26/2022)   Hunger Vital Sign    Worried About Running Out of Food in the Last Year: Never true    Ran Out of Food in the Last Year: Never true  Transportation Needs: No Transportation Needs (01/26/2022)   PRAPARE - Administrator, Civil Service (Medical):  No    Lack of Transportation (Non-Medical): No  Physical Activity: Insufficiently Active (01/26/2022)   Exercise Vital Sign    Days of Exercise per Week: 3 days    Minutes of Exercise per Session: 30 min  Stress: No Stress Concern Present (01/26/2022)   [employer] of Occupational Health - Occupational Stress Questionnaire    Feeling of Stress : Only a little  Social Connections: Moderately Integrated (01/26/2022)   Social Connection and Isolation Panel [NHANES]    Frequency of Communication with Friends and Family: More than three times a week    Frequency of Social Gatherings with Friends and Family: Once a week    Attends Religious Services: More than 4 times per year    Active Member of [employer] or Organizations: Yes    Attends Engineer, structural: More than 4 times per year    Marital Status: Never married  Intimate Partner Violence: Not At Risk (01/26/2022)   Humiliation, Afraid, Rape, and Kick questionnaire    Fear of Current or Ex-Partner: No    Emotionally Abused: No    Physically Abused: No    Sexually Abused: No    FAMILY HISTORY: Family History  Problem Relation Age of Onset   Cancer Father 51       lung cancer    Cancer Maternal Grandmother        breast   Cancer Maternal Grandfather    Breast cancer Other        MGMs mother    Colon cancer Neg Hx     Review of Systems  Constitutional:  Positive for fatigue. Negative for appetite change, chills, fever and unexpected weight change.  HENT:   Negative for hearing loss, lump/mass and trouble swallowing.   Eyes:  Negative for eye problems and icterus.  Respiratory:  Negative for chest tightness, cough and shortness of breath.   Cardiovascular:  Negative for chest pain, leg swelling and palpitations.  Gastrointestinal:  Negative for abdominal distention, abdominal pain, constipation, diarrhea, nausea and vomiting.  Endocrine: Negative for hot flashes.  Genitourinary:  Negative for difficulty urinating.   Musculoskeletal:  Negative for arthralgias.  Skin:  Negative for itching and rash.  Neurological:  Negative for dizziness, extremity weakness, headaches and numbness.  Hematological:  Negative for adenopathy. Does not bruise/bleed easily.  Psychiatric/Behavioral:  Negative for depression. The patient is not nervous/anxious.       PHYSICAL EXAMINATION    Vitals:   08/15/23 1147  BP: (!) 133/58  Pulse: (!) 56  Resp: 18  Temp: 99 F (37.2 C)  SpO2: 100%    Physical Exam Constitutional:      General: She is not in acute distress.    Appearance: Normal appearance. She is not toxic-appearing.  HENT:     Head: Normocephalic and atraumatic.     Mouth/Throat:     Mouth: Mucous membranes are moist.     Pharynx: Oropharynx is clear. No oropharyngeal exudate or posterior oropharyngeal erythema.  Eyes:     General: No scleral icterus. Cardiovascular:     Rate and Rhythm: Normal rate and regular rhythm.     Pulses: Normal pulses.     Heart sounds: Normal heart sounds.  Pulmonary:     Effort: Pulmonary effort is normal.     Breath sounds: Normal breath sounds.  Abdominal:     General: Abdomen is flat. Bowel sounds are normal. There is no distension.     Palpations: Abdomen is soft.  Tenderness: There is no abdominal tenderness.  Musculoskeletal:         General: No swelling.     Cervical back: Neck supple.  Lymphadenopathy:     Cervical: No cervical adenopathy.  Skin:    General: Skin is warm and dry.     Findings: No rash.  Neurological:     General: No focal deficit present.     Mental Status: She is alert.  Psychiatric:        Mood and Affect: Mood normal.        Behavior: Behavior normal.     LABORATORY DATA:  CBC    Component Value Date/Time   WBC 5.4 08/15/2023 1126   WBC 5.1 05/31/2023 0831   RBC 3.83 (L) 08/15/2023 1126   HGB 10.7 (L) 08/15/2023 1126   HGB 12.2 07/26/2016 1650   HCT 33.1 (L) 08/15/2023 1126   HCT 37.5 07/26/2016 1650   PLT 261 08/15/2023 1126   PLT 294 07/26/2016 1650   MCV 86.4 08/15/2023 1126   MCV 80 07/26/2016 1650   MCH 27.9 08/15/2023 1126   MCHC 32.3 08/15/2023 1126   RDW 16.8 (H) 08/15/2023 1126   RDW 14.0 07/26/2016 1650   LYMPHSABS 0.9 08/15/2023 1126   MONOABS 0.4 08/15/2023 1126   EOSABS 0.0 08/15/2023 1126   BASOSABS 0.0 08/15/2023 1126    CMP     Component Value Date/Time   NA 139 08/15/2023 1126   NA 140 07/26/2016 1650   K 3.5 08/15/2023 1126   CL 103 08/15/2023 1126   CO2 26 08/15/2023 1126   GLUCOSE 110 (H) 08/15/2023 1126   BUN 19 08/15/2023 1126   BUN 13 07/26/2016 1650   CREATININE 0.96 08/15/2023 1126   CALCIUM 9.9 08/15/2023 1126   PROT 7.8 08/15/2023 1126   PROT 7.4 07/26/2016 1650   ALBUMIN 4.2 08/15/2023 1126   ALBUMIN 4.2 07/26/2016 1650   AST 16 08/15/2023 1126   ALT 21 08/15/2023 1126   ALKPHOS 84 08/15/2023 1126   BILITOT 0.4 08/15/2023 1126   GFRNONAA >60 08/15/2023 1126   GFRAA >60 12/21/2018 1402   GFRAA >60 10/24/2018 1053     ASSESSMENT and THERAPY PLAN:   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  She is asymptomatic other than the fatigue that could possibly be related.  We have faxed the report to her cardiologist.  I discussed the case with Dr. Basilio Cairo who noted that while simulating the patient today she noted a slight improvement in her mediastinal mass suggesting an early response to the [facility] therapy.  Considering the extent of her mediastinal mass and the fact that it is working we will proceed with therapy today and the patient will follow-up closely with her cardiologist.  We reviewed red flags for when to go to the emergency room.  I reviewed her CBC with her which is stable.  She we will proceed with [name] as ordered so long as her c-Met is within parameters.  RTC in  1 week for day 8 of therapy and in 3 weeks for cycle 3 day 1 of therapy.   All questions were answered. The patient knows to call the clinic with any problems, questions or concerns. We can certainly see the patient much sooner if necessary.  Total encounter time:40 minutes*in face-to-face visit time, chart review, lab review, care coordination, order entry, and documentation of the encounter time.    Lillard Anes, NP 08/15/23 12:43 PM Medical Oncology and Hematology [facility] [address] [place], [place] [address] Tel. [phone]    Fax. [phone]  *Total Encounter Time as defined by the Centers for Medicare and Medicaid Services includes, in addition to the face-to-face time of a patient visit (documented in the note above) non-face-to-face time: obtaining and reviewing outside history, ordering and reviewing medications, tests or procedures,  care coordination (communications with other health care professionals or caregivers) and documentation in the medical record.

## 2023-08-15 NOTE — Progress Notes (Signed)
Pt declined to stay for 30 min post trodelvy observation period. No hx adverse s/s. Pt ambulated independently to lobby for d/c.

## 2023-08-15 NOTE — Patient Instructions (Signed)
[facility] [facility]  Discharge Instructions: Thank you for choosing [facility] to provide your oncology and hematology care.   If you have a lab appointment with the Cancer Center, please go directly to the Cancer Center and check in at the registration area.   Wear comfortable clothing and clothing appropriate for easy access to any Portacath or PICC line.   We strive to give you quality time with your provider. You may need to reschedule your appointment if you arrive late (15 or more minutes).  Arriving late affects you and other patients whose appointments are after yours.  Also, if you miss three or more appointments without notifying the office, you may be dismissed from the clinic at the provider's discretion.      For prescription refill requests, have your pharmacy contact our office and allow 72 hours for refills to be completed.    Today you received the following chemotherapy and/or immunotherapy agent: Sacituzumab Govitecan [name])   To help prevent nausea and vomiting after your treatment, we encourage you to take your nausea medication as directed.  BELOW ARE SYMPTOMS THAT SHOULD BE REPORTED IMMEDIATELY: *FEVER GREATER THAN 100.4 F (38 C) OR HIGHER *CHILLS OR SWEATING *NAUSEA AND VOMITING THAT IS NOT CONTROLLED WITH YOUR NAUSEA MEDICATION *UNUSUAL SHORTNESS OF BREATH *UNUSUAL BRUISING OR BLEEDING *URINARY PROBLEMS (pain or burning when urinating, or frequent urination) *BOWEL PROBLEMS (unusual diarrhea, constipation, pain near the anus) TENDERNESS IN MOUTH AND THROAT WITH OR WITHOUT PRESENCE OF ULCERS (sore throat, sores in mouth, or a toothache) UNUSUAL RASH, SWELLING OR PAIN  UNUSUAL VAGINAL DISCHARGE OR ITCHING   Items with * indicate a potential emergency and should be followed up as soon as possible or go to the Emergency Department if any problems should occur.  Please show the CHEMOTHERAPY ALERT CARD or IMMUNOTHERAPY  ALERT CARD at check-in to the Emergency Department and triage nurse.  Should you have questions after your visit or need to cancel or reschedule your appointment, please contact [place] [facility]  Dept: [phone]  and follow the prompts.  Office hours are 8:00 a.m. to 4:30 p.m. Monday - Friday. Please note that voicemails left after 4:00 p.m. may not be returned until the following business day.  We are closed weekends and major holidays. You have access to a nurse at all times for urgent questions. Please call the main number to the clinic Dept: [phone] and follow the prompts.   For any non-urgent questions, you may also contact your provider using MyChart. We now offer e-Visits for anyone 40 and older to request care online for non-urgent symptoms. For details visit [email or web address].   Also download the MyChart app! Go to the app store, search "MyChart", open the app, select [place], and log in with your MyChart username and password. Sacituzumab Govitecan Injection What is this medication? SACITUZUMAB GOVITECAN (SAK i TOOZ ue mab GOE vi TEE kan) treats breast cancer. It may also be used to treat bladder cancer and kidney cancer. It works by blocking a protein that causes cancer cells to grow and multiply. This helps to slow or stop the spread of cancer cells. This medicine may be used for other purposes; ask your health care provider or pharmacist if you have questions. COMMON BRAND NAME(S): TRODELVY What should I tell my care team before I take this medication? They need to know if you have any of these conditions: Carry the UGT1A1*28 gene  Infection Liver disease An unusual or allergic reaction to sacituzumab govitecan, other medications, foods, dyes, or preservatives Pregnant or trying to get pregnant Breast-feeding How should I use this medication? This medication is injected into a vein. It is given by your care team in a hospital or  clinic setting. Talk to your care team about the use of this medication in children. Special care may be needed. Overdosage: If you think you have taken too much of this medicine contact a poison control center or emergency room at once. NOTE: This medicine is only for you. Do not share this medicine with others. What if I miss a dose? Keep appointments for follow-up doses. It is important not to miss your dose. Call your care team if you are unable to keep an appointment. What may interact with this medication? This medication may affect how other medications work, and other medications may affect the way this medication works. Talk with your care team about all of the medications you take. They may suggest changes to your treatment plan to lower the risk of side effects and to make sure your medications work as intended. This list may not describe all possible interactions. Give your health care provider a list of all the medicines, herbs, non-prescription drugs, or dietary supplements you use. Also tell them if you smoke, drink alcohol, or use illegal drugs. Some items may interact with your medicine. What should I watch for while using this medication? This medication may make you feel generally unwell. This is not uncommon as chemotherapy can affect healthy cells as well as cancer cells. Report any side effects. Continue your course of treatment even though you feel ill unless your care team tells you to stop. You may need blood work while you are taking this medication. Certain genetic factors may decrease the safety of this medication. Your care team may use genetic tests to determine treatment. This medication can cause serious allergic reactions. To reduce your risk, your care team may give you other medications to take before receiving this one. Be sure to follow the directions from your care team. Check with your care team if you have severe diarrhea, nausea, and vomiting, or if you sweat a  lot. The loss of too much body fluid may make it dangerous for you to take this medication. Talk to your care team if you wish to become pregnant or think you might be pregnant. This medication can cause serious birth defects if taken during pregnancy or if you get pregnant within 6 months after stopping treatment. A negative pregnancy test is required before starting this medication. A reliable form of contraception is recommended while taking this medication and for 6 months after stopping treatment. Talk to your care team about reliable forms of contraception. Use a condom during sex and for 3 months after stopping treatment. Tell your care team right away if you think your partner might be pregnant. This medication can cause serious birth defects. Do not breast-feed while taking this medication and for 1 month after stopping therapy. This medication may cause infertility. Talk to your care team if you are concerned about your fertility. This medication may increase your risk of getting an infection. Call your care team for advice if you get a fever, chills, sore throat, or other symptoms of a cold or flu. Do not treat yourself. Try to avoid being around people who are sick. Avoid taking medications that contain aspirin, acetaminophen, ibuprofen, naproxen, or ketoprofen unless instructed by your  care team. These medications may hide a fever. This medication may increase blood sugar. The risk may be higher in patients who already have diabetes. Ask your care team what you can do to lower your risk of diabetes while taking this medication. What side effects may I notice from receiving this medication? Side effects that you should report to your care team as soon as possible: Allergic reactions--skin rash, itching, hives, swelling of the face, lips, tongue, or throat Infection--fever, chills, cough, or sore throat Infusion reactions--chest pain, shortness of breath or trouble breathing, feeling faint or  lightheaded Low red blood cell level--unusual weakness or fatigue, dizziness, headache, trouble breathing Severe or prolonged diarrhea Side effects that usually do not require medical attention (report these to your care team if they continue or are bothersome): Constipation Diarrhea Fatigue Hair loss Loss of appetite Nausea Vomiting This list may not describe all possible side effects. Call your doctor for medical advice about side effects. You may report side effects to FDA at 1-800-FDA-1088. Where should I keep my medication? This medication is given in a hospital or clinic. It will not be stored at home. NOTE: This sheet is a summary. It may not cover all possible information. If you have questions about this medicine, talk to your doctor, pharmacist, or health care provider.  2024 Elsevier/Gold Standard (2022-02-23 00:00:00)

## 2023-08-16 ENCOUNTER — Other Ambulatory Visit: Payer: Self-pay

## 2023-08-16 ENCOUNTER — Ambulatory Visit
Admission: RE | Admit: 2023-08-16 | Discharge: 2023-08-16 | Disposition: A | Discharge status: Home / Self Care | Payer: 59 | Source: Ambulatory Visit | Attending: Radiation Oncology | Admitting: Radiation Oncology

## 2023-08-16 DIAGNOSIS — Z5112 Encounter for antineoplastic immunotherapy: Secondary | ICD-10-CM | POA: Diagnosis not present

## 2023-08-16 LAB — RAD ONC ARIA SESSION SUMMARY
Course Elapsed Days: 1
Plan Fractions Treated to Date: 2
Plan Prescribed Dose Per Fraction: 3 Gy
Plan Total Fractions Prescribed: 10
Plan Total Prescribed Dose: 30 Gy
Reference Point Dosage Given to Date: 6 Gy
Reference Point Session Dosage Given: 3 Gy
Session Number: 2

## 2023-08-17 ENCOUNTER — Ambulatory Visit
Admission: RE | Admit: 2023-08-17 | Discharge: 2023-08-17 | Disposition: A | Discharge status: Home / Self Care | Payer: 59 | Source: Ambulatory Visit | Attending: Radiation Oncology

## 2023-08-17 ENCOUNTER — Other Ambulatory Visit: Payer: Self-pay

## 2023-08-17 ENCOUNTER — Telehealth: Payer: Self-pay

## 2023-08-17 DIAGNOSIS — Z5112 Encounter for antineoplastic immunotherapy: Secondary | ICD-10-CM | POA: Diagnosis not present

## 2023-08-17 LAB — RAD ONC ARIA SESSION SUMMARY
Course Elapsed Days: 2
Plan Fractions Treated to Date: 3
Plan Prescribed Dose Per Fraction: 3 Gy
Plan Total Fractions Prescribed: 10
Plan Total Prescribed Dose: 30 Gy
Reference Point Dosage Given to Date: 9 Gy
Reference Point Session Dosage Given: 3 Gy
Session Number: 3

## 2023-08-17 NOTE — Telephone Encounter (Signed)
Shari Prows from [employer] called to confirm with nursing staff he will coming tomorrow at 10 am to adjust pt pacemaker settings.

## 2023-08-17 NOTE — Telephone Encounter (Signed)
RN spoke with Mardella Layman RN with [facility] Cardiology group. Mardella Layman was able to speak with Shari Prows from [employer], who will come out this week to adjust pt pacemaker while she is in clinic with [place]. Mardella Layman stated they are changing her lower setting from 50 to 60. Rn will continue to communicate with Shari Prows and the pt as well to coordinate this change.

## 2023-08-17 NOTE — Progress Notes (Signed)
RN from [facility] Cardiology clinic called to get more information on recent pacemaker interrogation for pt. Rn noted that the pt will need adjustment to her pacemaker. RN provided [employer] representative information to clinic for them to call her. RN will follow up on this request.

## 2023-08-18 ENCOUNTER — Other Ambulatory Visit: Payer: Self-pay

## 2023-08-18 ENCOUNTER — Ambulatory Visit
Admission: RE | Admit: 2023-08-18 | Discharge: 2023-08-18 | Disposition: A | Discharge status: Home / Self Care | Payer: 59 | Source: Ambulatory Visit | Attending: Radiation Oncology | Admitting: Radiation Oncology

## 2023-08-18 DIAGNOSIS — Z5112 Encounter for antineoplastic immunotherapy: Secondary | ICD-10-CM | POA: Diagnosis not present

## 2023-08-18 DIAGNOSIS — C771 Secondary and unspecified malignant neoplasm of intrathoracic lymph nodes: Secondary | ICD-10-CM

## 2023-08-18 LAB — RAD ONC ARIA SESSION SUMMARY
Course Elapsed Days: 3
Plan Fractions Treated to Date: 4
Plan Prescribed Dose Per Fraction: 3 Gy
Plan Total Fractions Prescribed: 10
Plan Total Prescribed Dose: 30 Gy
Reference Point Dosage Given to Date: 12 Gy
Reference Point Session Dosage Given: 3 Gy
Session Number: 4

## 2023-08-18 MED ORDER — SONAFINE EX EMUL
1.0000 | Freq: Once | CUTANEOUS | Status: AC
  Administered 2023-08-18: 1 via TOPICAL

## 2023-08-18 NOTE — Progress Notes (Signed)
Woodell from [employer] came to clinic after pt treatment today to adjust pt pacemaker. Her lower setting was increased from 50 to 60 per orders of cardiology office. Intergoation report will be sent to cardiology clinic.

## 2023-08-19 ENCOUNTER — Ambulatory Visit
Admission: RE | Admit: 2023-08-19 | Discharge: 2023-08-19 | Disposition: A | Discharge status: Home / Self Care | Payer: 59 | Source: Ambulatory Visit | Attending: Radiation Oncology

## 2023-08-19 ENCOUNTER — Other Ambulatory Visit: Payer: Self-pay

## 2023-08-19 DIAGNOSIS — C771 Secondary and unspecified malignant neoplasm of intrathoracic lymph nodes: Secondary | ICD-10-CM

## 2023-08-19 DIAGNOSIS — Z5112 Encounter for antineoplastic immunotherapy: Secondary | ICD-10-CM | POA: Diagnosis not present

## 2023-08-19 LAB — RAD ONC ARIA SESSION SUMMARY
Course Elapsed Days: 4
Plan Fractions Treated to Date: 5
Plan Prescribed Dose Per Fraction: 3 Gy
Plan Total Fractions Prescribed: 10
Plan Total Prescribed Dose: 30 Gy
Reference Point Dosage Given to Date: 15 Gy
Reference Point Session Dosage Given: 3 Gy
Session Number: 5

## 2023-08-19 MED FILL — Fosaprepitant Dimeglumine For IV Infusion 150 MG (Base Eq): INTRAVENOUS | Qty: 5 | Status: AC

## 2023-08-22 ENCOUNTER — Inpatient Hospital Stay: Payer: 59

## 2023-08-22 ENCOUNTER — Other Ambulatory Visit: Payer: Self-pay

## 2023-08-22 ENCOUNTER — Telehealth: Payer: Self-pay | Admitting: *Deleted

## 2023-08-22 ENCOUNTER — Ambulatory Visit
Admission: RE | Admit: 2023-08-22 | Discharge: 2023-08-22 | Disposition: A | Discharge status: Home / Self Care | Payer: 59 | Source: Ambulatory Visit | Attending: Radiation Oncology | Admitting: Radiation Oncology

## 2023-08-22 VITALS — BP 142/83 | HR 103 | Temp 98.4°F | Resp 18 | Wt 210.1 lb

## 2023-08-22 DIAGNOSIS — Z95828 Presence of other vascular implants and grafts: Secondary | ICD-10-CM

## 2023-08-22 DIAGNOSIS — C50919 Malignant neoplasm of unspecified site of unspecified female breast: Secondary | ICD-10-CM

## 2023-08-22 DIAGNOSIS — Z5112 Encounter for antineoplastic immunotherapy: Secondary | ICD-10-CM | POA: Diagnosis not present

## 2023-08-22 DIAGNOSIS — Z950 Presence of cardiac pacemaker: Secondary | ICD-10-CM

## 2023-08-22 DIAGNOSIS — N6331 Unspecified lump in axillary tail of the right breast: Secondary | ICD-10-CM

## 2023-08-22 LAB — CBC WITH DIFFERENTIAL (CANCER CENTER ONLY)
Abs Immature Granulocytes: 0.14 10*3/uL — ABNORMAL HIGH (ref 0.00–0.07)
Basophils Absolute: 0 10*3/uL (ref 0.0–0.1)
Basophils Relative: 1 %
Eosinophils Absolute: 0.1 10*3/uL (ref 0.0–0.5)
Eosinophils Relative: 4 %
HCT: 31.9 % — ABNORMAL LOW (ref 36.0–46.0)
Hemoglobin: 10.6 g/dL — ABNORMAL LOW (ref 12.0–15.0)
Immature Granulocytes: 5 %
Lymphocytes Relative: 13 %
Lymphs Abs: 0.4 10*3/uL — ABNORMAL LOW (ref 0.7–4.0)
MCH: 28.5 pg (ref 26.0–34.0)
MCHC: 33.2 g/dL (ref 30.0–36.0)
MCV: 85.8 fL (ref 80.0–100.0)
Monocytes Absolute: 0.3 10*3/uL (ref 0.1–1.0)
Monocytes Relative: 10 %
Neutro Abs: 1.9 10*3/uL (ref 1.7–7.7)
Neutrophils Relative %: 67 %
Platelet Count: 243 10*3/uL (ref 150–400)
RBC: 3.72 MIL/uL — ABNORMAL LOW (ref 3.87–5.11)
RDW: 16.1 % — ABNORMAL HIGH (ref 11.5–15.5)
WBC Count: 2.9 10*3/uL — ABNORMAL LOW (ref 4.0–10.5)
nRBC: 0 % (ref 0.0–0.2)

## 2023-08-22 LAB — CMP (CANCER CENTER ONLY)
ALT: 14 U/L (ref 0–44)
AST: 14 U/L — ABNORMAL LOW (ref 15–41)
Albumin: 4.1 g/dL (ref 3.5–5.0)
Alkaline Phosphatase: 81 U/L (ref 38–126)
Anion gap: 9 (ref 5–15)
BUN: 20 mg/dL (ref 6–20)
CO2: 28 mmol/L (ref 22–32)
Calcium: 9.7 mg/dL (ref 8.9–10.3)
Chloride: 100 mmol/L (ref 98–111)
Creatinine: 0.85 mg/dL (ref 0.44–1.00)
GFR, Estimated: >60 mL/min (ref >60)
Glucose, Bld: 91 mg/dL (ref 70–99)
Potassium: 3.5 mmol/L (ref 3.5–5.1)
Sodium: 137 mmol/L (ref 135–145)
Total Bilirubin: 0.4 mg/dL (ref 0.3–1.2)
Total Protein: 7.6 g/dL (ref 6.5–8.1)

## 2023-08-22 LAB — RAD ONC ARIA SESSION SUMMARY
Course Elapsed Days: 7
Plan Fractions Treated to Date: 6
Plan Prescribed Dose Per Fraction: 3 Gy
Plan Total Fractions Prescribed: 10
Plan Total Prescribed Dose: 30 Gy
Reference Point Dosage Given to Date: 18 Gy
Reference Point Session Dosage Given: 3 Gy
Session Number: 6

## 2023-08-22 LAB — MAGNESIUM: Magnesium: 1.8 mg/dL (ref 1.7–2.4)

## 2023-08-22 LAB — PHOSPHORUS: Phosphorus: 4.2 mg/dL (ref 2.5–4.6)

## 2023-08-22 MED ORDER — SODIUM CHLORIDE 0.9 % IV SOLN
7.5000 mg/kg | Freq: Once | INTRAVENOUS | Status: AC
  Administered 2023-08-22: 720 mg via INTRAVENOUS
  Filled 2023-08-22: qty 72

## 2023-08-22 MED ORDER — ACETAMINOPHEN 325 MG PO TABS
650.0000 mg | ORAL_TABLET | Freq: Once | ORAL | Status: AC
  Administered 2023-08-22: 650 mg via ORAL
  Filled 2023-08-22: qty 2

## 2023-08-22 MED ORDER — SODIUM CHLORIDE 0.9% FLUSH
10.0000 mL | Freq: Once | INTRAVENOUS | Status: AC
Start: 2023-08-22 — End: 2023-08-22
  Administered 2023-08-22: 10 mL

## 2023-08-22 MED ORDER — SODIUM CHLORIDE 0.9 % IV SOLN: Freq: Once | INTRAVENOUS | Status: AC

## 2023-08-22 MED ORDER — DIPHENHYDRAMINE HCL 50 MG/ML IJ SOLN
50.0000 mg | Freq: Once | INTRAMUSCULAR | Status: AC
  Administered 2023-08-22: 50 mg via INTRAVENOUS
  Filled 2023-08-22: qty 1

## 2023-08-22 MED ORDER — SODIUM CHLORIDE 0.9 % IV SOLN
150.0000 mg | Freq: Once | INTRAVENOUS | Status: AC
  Administered 2023-08-22: 150 mg via INTRAVENOUS
  Filled 2023-08-22: qty 150

## 2023-08-22 MED ORDER — FAMOTIDINE IN NACL 20-0.9 MG/50ML-% IV SOLN
20.0000 mg | Freq: Once | INTRAVENOUS | Status: AC
  Administered 2023-08-22: 20 mg via INTRAVENOUS
  Filled 2023-08-22: qty 50

## 2023-08-22 MED ORDER — PALONOSETRON HCL INJECTION 0.25 MG/5ML
0.2500 mg | Freq: Once | INTRAVENOUS | Status: AC
  Administered 2023-08-22: 0.25 mg via INTRAVENOUS
  Filled 2023-08-22: qty 5

## 2023-08-22 MED ORDER — HEPARIN SOD (PORK) LOCK FLUSH 100 UNIT/ML IV SOLN
500.0000 [IU] | Freq: Once | INTRAVENOUS | Status: AC | PRN
  Administered 2023-08-22: 500 [IU]

## 2023-08-22 MED ORDER — SODIUM CHLORIDE 0.9% FLUSH
10.0000 mL | INTRAVENOUS | Status: DC | PRN
  Administered 2023-08-22: 10 mL

## 2023-08-22 MED ORDER — DEXAMETHASONE SODIUM PHOSPHATE 10 MG/ML IJ SOLN
10.0000 mg | Freq: Once | INTRAMUSCULAR | Status: AC
  Administered 2023-08-22: 10 mg via INTRAVENOUS
  Filled 2023-08-22: qty 1

## 2023-08-22 NOTE — Patient Instructions (Signed)
[facility] [facility]  Discharge Instructions: Thank you for choosing [facility] to provide your oncology and hematology care.   If you have a lab appointment with the Cancer Center, please go directly to the Cancer Center and check in at the registration area.   Wear comfortable clothing and clothing appropriate for easy access to any Portacath or PICC line.   We strive to give you quality time with your provider. You may need to reschedule your appointment if you arrive late (15 or more minutes).  Arriving late affects you and other patients whose appointments are after yours.  Also, if you miss three or more appointments without notifying the office, you may be dismissed from the clinic at the provider's discretion.      For prescription refill requests, have your pharmacy contact our office and allow 72 hours for refills to be completed.    Today you received the following chemotherapy and/or immunotherapy agent: Sacituzumab Govitecan [name])   To help prevent nausea and vomiting after your treatment, we encourage you to take your nausea medication as directed.  BELOW ARE SYMPTOMS THAT SHOULD BE REPORTED IMMEDIATELY: *FEVER GREATER THAN 100.4 F (38 C) OR HIGHER *CHILLS OR SWEATING *NAUSEA AND VOMITING THAT IS NOT CONTROLLED WITH YOUR NAUSEA MEDICATION *UNUSUAL SHORTNESS OF BREATH *UNUSUAL BRUISING OR BLEEDING *URINARY PROBLEMS (pain or burning when urinating, or frequent urination) *BOWEL PROBLEMS (unusual diarrhea, constipation, pain near the anus) TENDERNESS IN MOUTH AND THROAT WITH OR WITHOUT PRESENCE OF ULCERS (sore throat, sores in mouth, or a toothache) UNUSUAL RASH, SWELLING OR PAIN  UNUSUAL VAGINAL DISCHARGE OR ITCHING   Items with * indicate a potential emergency and should be followed up as soon as possible or go to the Emergency Department if any problems should occur.  Please show the CHEMOTHERAPY ALERT CARD or IMMUNOTHERAPY  ALERT CARD at check-in to the Emergency Department and triage nurse.  Should you have questions after your visit or need to cancel or reschedule your appointment, please contact [place] [facility]  Dept: [phone]  and follow the prompts.  Office hours are 8:00 a.m. to 4:30 p.m. Monday - Friday. Please note that voicemails left after 4:00 p.m. may not be returned until the following business day.  We are closed weekends and major holidays. You have access to a nurse at all times for urgent questions. Please call the main number to the clinic Dept: [phone] and follow the prompts.   For any non-urgent questions, you may also contact your provider using MyChart. We now offer e-Visits for anyone 40 and older to request care online for non-urgent symptoms. For details visit [email or web address].   Also download the MyChart app! Go to the app store, search "MyChart", open the app, select [place], and log in with your MyChart username and password. Sacituzumab Govitecan Injection What is this medication? SACITUZUMAB GOVITECAN (SAK i TOOZ ue mab GOE vi TEE kan) treats breast cancer. It may also be used to treat bladder cancer and kidney cancer. It works by blocking a protein that causes cancer cells to grow and multiply. This helps to slow or stop the spread of cancer cells. This medicine may be used for other purposes; ask your health care provider or pharmacist if you have questions. COMMON BRAND NAME(S): TRODELVY What should I tell my care team before I take this medication? They need to know if you have any of these conditions: Carry the UGT1A1*28 gene  Infection Liver disease An unusual or allergic reaction to sacituzumab govitecan, other medications, foods, dyes, or preservatives Pregnant or trying to get pregnant Breast-feeding How should I use this medication? This medication is injected into a vein. It is given by your care team in a hospital or  clinic setting. Talk to your care team about the use of this medication in children. Special care may be needed. Overdosage: If you think you have taken too much of this medicine contact a poison control center or emergency room at once. NOTE: This medicine is only for you. Do not share this medicine with others. What if I miss a dose? Keep appointments for follow-up doses. It is important not to miss your dose. Call your care team if you are unable to keep an appointment. What may interact with this medication? This medication may affect how other medications work, and other medications may affect the way this medication works. Talk with your care team about all of the medications you take. They may suggest changes to your treatment plan to lower the risk of side effects and to make sure your medications work as intended. This list may not describe all possible interactions. Give your health care provider a list of all the medicines, herbs, non-prescription drugs, or dietary supplements you use. Also tell them if you smoke, drink alcohol, or use illegal drugs. Some items may interact with your medicine. What should I watch for while using this medication? This medication may make you feel generally unwell. This is not uncommon as chemotherapy can affect healthy cells as well as cancer cells. Report any side effects. Continue your course of treatment even though you feel ill unless your care team tells you to stop. You may need blood work while you are taking this medication. Certain genetic factors may decrease the safety of this medication. Your care team may use genetic tests to determine treatment. This medication can cause serious allergic reactions. To reduce your risk, your care team may give you other medications to take before receiving this one. Be sure to follow the directions from your care team. Check with your care team if you have severe diarrhea, nausea, and vomiting, or if you sweat a  lot. The loss of too much body fluid may make it dangerous for you to take this medication. Talk to your care team if you wish to become pregnant or think you might be pregnant. This medication can cause serious birth defects if taken during pregnancy or if you get pregnant within 6 months after stopping treatment. A negative pregnancy test is required before starting this medication. A reliable form of contraception is recommended while taking this medication and for 6 months after stopping treatment. Talk to your care team about reliable forms of contraception. Use a condom during sex and for 3 months after stopping treatment. Tell your care team right away if you think your partner might be pregnant. This medication can cause serious birth defects. Do not breast-feed while taking this medication and for 1 month after stopping therapy. This medication may cause infertility. Talk to your care team if you are concerned about your fertility. This medication may increase your risk of getting an infection. Call your care team for advice if you get a fever, chills, sore throat, or other symptoms of a cold or flu. Do not treat yourself. Try to avoid being around people who are sick. Avoid taking medications that contain aspirin, acetaminophen, ibuprofen, naproxen, or ketoprofen unless instructed by your  care team. These medications may hide a fever. This medication may increase blood sugar. The risk may be higher in patients who already have diabetes. Ask your care team what you can do to lower your risk of diabetes while taking this medication. What side effects may I notice from receiving this medication? Side effects that you should report to your care team as soon as possible: Allergic reactions--skin rash, itching, hives, swelling of the face, lips, tongue, or throat Infection--fever, chills, cough, or sore throat Infusion reactions--chest pain, shortness of breath or trouble breathing, feeling faint or  lightheaded Low red blood cell level--unusual weakness or fatigue, dizziness, headache, trouble breathing Severe or prolonged diarrhea Side effects that usually do not require medical attention (report these to your care team if they continue or are bothersome): Constipation Diarrhea Fatigue Hair loss Loss of appetite Nausea Vomiting This list may not describe all possible side effects. Call your doctor for medical advice about side effects. You may report side effects to FDA at 1-800-FDA-1088. Where should I keep my medication? This medication is given in a hospital or clinic. It will not be stored at home. NOTE: This sheet is a summary. It may not cover all possible information. If you have questions about this medicine, talk to your doctor, pharmacist, or health care provider.  2024 Elsevier/Gold Standard (2022-02-23 00:00:00)

## 2023-08-22 NOTE — Telephone Encounter (Signed)
CALLED PATIENT TO INFORM OF APPT. WITH DR. GREGG TAYLOR ON 09-20-23- ARRIVAL TIME- 11:45 AM- ADDRESS- [address], PHONE NUMBER - [phone], LVM FOR A RETURN CALL

## 2023-08-22 NOTE — Progress Notes (Signed)
Cardiology consult placed per Dr. Basilio Cairo

## 2023-08-23 ENCOUNTER — Other Ambulatory Visit: Payer: Self-pay

## 2023-08-23 ENCOUNTER — Ambulatory Visit
Admission: RE | Admit: 2023-08-23 | Discharge: 2023-08-23 | Disposition: A | Discharge status: Home / Self Care | Payer: 59 | Source: Ambulatory Visit | Attending: Radiation Oncology

## 2023-08-23 DIAGNOSIS — Z5112 Encounter for antineoplastic immunotherapy: Secondary | ICD-10-CM | POA: Diagnosis not present

## 2023-08-23 DIAGNOSIS — C771 Secondary and unspecified malignant neoplasm of intrathoracic lymph nodes: Secondary | ICD-10-CM

## 2023-08-23 LAB — RAD ONC ARIA SESSION SUMMARY
Course Elapsed Days: 8
Plan Fractions Treated to Date: 7
Plan Prescribed Dose Per Fraction: 3 Gy
Plan Total Fractions Prescribed: 10
Plan Total Prescribed Dose: 30 Gy
Reference Point Dosage Given to Date: 21 Gy
Reference Point Session Dosage Given: 3 Gy
Session Number: 7

## 2023-08-24 ENCOUNTER — Other Ambulatory Visit: Payer: Self-pay

## 2023-08-24 ENCOUNTER — Ambulatory Visit
Admission: RE | Admit: 2023-08-24 | Discharge: 2023-08-24 | Disposition: A | Discharge status: Home / Self Care | Payer: 59 | Source: Ambulatory Visit | Attending: Radiation Oncology

## 2023-08-24 DIAGNOSIS — Z5112 Encounter for antineoplastic immunotherapy: Secondary | ICD-10-CM | POA: Diagnosis not present

## 2023-08-24 LAB — RAD ONC ARIA SESSION SUMMARY
Course Elapsed Days: 9
Plan Fractions Treated to Date: 8
Plan Prescribed Dose Per Fraction: 3 Gy
Plan Total Fractions Prescribed: 10
Plan Total Prescribed Dose: 30 Gy
Reference Point Dosage Given to Date: 24 Gy
Reference Point Session Dosage Given: 3 Gy
Session Number: 8

## 2023-08-25 ENCOUNTER — Ambulatory Visit
Admission: RE | Admit: 2023-08-25 | Discharge: 2023-08-25 | Disposition: A | Discharge status: Home / Self Care | Payer: 59 | Source: Ambulatory Visit | Attending: Radiation Oncology | Admitting: Radiation Oncology

## 2023-08-25 ENCOUNTER — Other Ambulatory Visit: Payer: Self-pay

## 2023-08-25 DIAGNOSIS — Z5112 Encounter for antineoplastic immunotherapy: Secondary | ICD-10-CM | POA: Diagnosis not present

## 2023-08-25 LAB — RAD ONC ARIA SESSION SUMMARY
Course Elapsed Days: 10
Plan Fractions Treated to Date: 9
Plan Prescribed Dose Per Fraction: 3 Gy
Plan Total Fractions Prescribed: 10
Plan Total Prescribed Dose: 30 Gy
Reference Point Dosage Given to Date: 27 Gy
Reference Point Session Dosage Given: 3 Gy
Session Number: 9

## 2023-08-26 ENCOUNTER — Ambulatory Visit
Admission: RE | Admit: 2023-08-26 | Discharge: 2023-08-26 | Disposition: A | Discharge status: Home / Self Care | Payer: 59 | Source: Ambulatory Visit | Attending: Radiation Oncology | Admitting: Radiation Oncology

## 2023-08-26 ENCOUNTER — Other Ambulatory Visit: Payer: Self-pay

## 2023-08-26 DIAGNOSIS — Z510 Encounter for antineoplastic radiation therapy: Secondary | ICD-10-CM | POA: Diagnosis not present

## 2023-08-26 DIAGNOSIS — C50611 Malignant neoplasm of axillary tail of right female breast: Secondary | ICD-10-CM | POA: Insufficient documentation

## 2023-08-26 DIAGNOSIS — C771 Secondary and unspecified malignant neoplasm of intrathoracic lymph nodes: Secondary | ICD-10-CM | POA: Insufficient documentation

## 2023-08-26 LAB — RAD ONC ARIA SESSION SUMMARY
Course Elapsed Days: 11
Plan Fractions Treated to Date: 10
Plan Prescribed Dose Per Fraction: 3 Gy
Plan Total Fractions Prescribed: 10
Plan Total Prescribed Dose: 30 Gy
Reference Point Dosage Given to Date: 30 Gy
Reference Point Session Dosage Given: 3 Gy
Session Number: 10

## 2023-08-26 NOTE — Progress Notes (Signed)
Jessica Simpson from [employer] came to interrogate pt pacemaker and report was sent to cardiology office.

## 2023-08-29 NOTE — Radiation Completion Notes (Signed)
Patient Name: Jessica Simpson, Jessica Simpson MRN: [id number] Date of Birth: [birth year] Referring Physician: Ardean Larsen, M.D. Date of Service: 2023-08-29 Radiation Oncologist: Lonie Peak, M.D. 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

## 2023-08-30 ENCOUNTER — Telehealth: Payer: Self-pay | Admitting: *Deleted

## 2023-08-30 NOTE — Telephone Encounter (Signed)
RETURNED PATIENT'S PHONE CALL, SPOKE WITH PATIENT. ?

## 2023-09-01 ENCOUNTER — Telehealth: Payer: Self-pay

## 2023-09-01 NOTE — Telephone Encounter (Signed)
Pt called and reports she is having N/V. She reports only taking Ondansetron as prescribed. She states she "forgot about the other". Educated pt on taking antiemetics as prescribed. She was given an example of when to take Ondansetron and Prochloraperazine. She verbalized understanding of instructions.   She was advised to call back tomorrow if this is not working and if she is still experiencing N/V to see if she needs IVF.

## 2023-09-02 MED FILL — Fosaprepitant Dimeglumine For IV Infusion 150 MG (Base Eq): INTRAVENOUS | Qty: 5 | Status: AC

## 2023-09-02 NOTE — Progress Notes (Unsigned)
[facility] OFFICE PROGRESS NOTE  Arlina Robes, MD [address] 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 KI-67 of 99%    - Pathologic: No stage assigned - Unsigned Specimen type: Core Needle Biopsy Histopathologic type: Infiltrating duct carcinoma, NOS Tumor size (mm): 66 Prognostic indicators: Triple negative. 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)  06/30/2011 Initial Diagnosis   Breast cancer, IDC, Left, Stage III, Triple negative, 7.6 cm breast mass and palpable axillary mass   09/06/2011 Miscellaneous   BRCA 1 and 2: Negative   09/14/2011 - 11/23/2011 Neo-Adjuvant Chemotherapy   Dose dense FEC followed by dose dense Taxotere   12/21/2011 Surgery   Left lumpectomy: High-grade poorly differentiated IDC 1.7 cm with high-grade DCIS, margins negative, 3/7 lymph nodes positive, ER 0%, PR 0%, HER-2 negative ratio 1.33 T1CN1 stage IIb   12/31/2011 - 02/15/2012 Radiation Therapy   Radiation at [facility]   09/26/2017 Relapse/Recurrence   Left breast upper outer quadrant within the lumpectomy bed: Fibrosis no  malignancy, right axillary lymph node biopsy metastatic high-grade carcinoma ER 0%, PR 0%, Ki-67 90%, HER-2 negative ratio 1.11 (similar to previous ductal carcinoma)   11/14/2017 - 03/06/2018 Neo-Adjuvant Chemotherapy   Neo-Adjuvant chemotherapy with Gemzar and carboplatin days 1 and 8 every 3 weeks   01/10/2018 Genetic Testing   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  The Hereditary Gene Panel offered by [facility] includes sequencing and/or deletion duplication testing of the following 47 genes: APC, ATM, AXIN2, BARD1, BMPR1A, BRCA1, BRCA2, BRIP1, CDH1, CDK4, CDKN2A (p14ARF), CDKN2A (p16INK4a), CHEK2, CTNNA1, DICER1, EPCAM (Deletion/duplication testing only), GREM1 (promoter region deletion/duplication testing only), KIT, MEN1, MLH1, MSH2, MSH3, MSH6, MUTYH, NBN, NF1, NHTL1, PALB2, PDGFRA, PMS2, POLD1, POLE, PTEN, RAD50, RAD51C, RAD51D, SDHB, SDHC, SDHD, SMAD4, SMARCA4. STK11, TP53, TSC1, TSC2, and VHL.  The following genes were evaluated for sequence changes only: SDHA and HOXB13 c.251G>A variant only. The report date is January 10, 2018.    05/31/2023 Procedure   Mediastinal mass biopsy: Metastatic poorly differentiated carcinoma compatible with breast primary ER +60%, PR 0%, HER2 0, Ki-67 40%    06/02/2023 PET scan   PET/CT Anterior mediastinal mass is hypermetabolic and centrally necrotic, soft tissue lesions anterior to the heart, 10 mm pleural-based lesion, level 2 cervical nodes, hypermetabolism hepatic dome mottled FDG accumulation throughout the skeletal system    06/06/2023 - 07/14/2023 Anti-estrogen oral therapy   Antiestrogen therapy with Verzenio and letrozole    07/07/2023 Relapse/Recurrence   Increasing mediastinal mass 11.6 cm (used to be 10.8 cm) effacing the heart and involving the sternum, additional mediastinal/pericardial lymph nodes increased right extra lymph node 1.5 cm, nodules 0.2 cm (used to be 0.8 cm),  few new scattered lung nodules)    07/25/2023 -  Chemotherapy   Patient is on Treatment Plan : BREAST METASTATIC Sacituzumab govitecan-hziy [name]) D1,8 q21d     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)  04/06/2018 Surgery   Right axillary lymph node dissection: 1/11 node positive for metastatic high-grade carcinoma   04/25/2018 Initial Diagnosis   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 female returns to the clinic today for a follow up visit. She is here for further evaluation and repeat blood work before undergoing cycle #3.  Called in the interval with her last appointment and was endorsing nausea and vomiting.  She had forgotten that she had a additional antiemetic with Zofran.  Overall her energy and fatigue is stable.  Denies any chills, night sweats, or unexplained weight loss.  Denies any shortness of breath, chest pain, cough, or hemoptysis.  Denies any swelling or orthopnea,, chest pain, or hemoptysis. She completed radiation under the care with Dr. Basilio Cairo 08/26/23.  On a cough?  A cardiologist and a pacemaker and had some abnormalities her cardiologist in [place].  MEDICAL HISTORY: Past Medical History:  Diagnosis Date   BRCA1 negative 10/22/2011   BRCA2 negative 10/22/2011   Breast cancer, IDC, Left, Stage III, Triple negative 06/30/2011   Breast disorder    Contraceptive education 01/15/2016   Cough    Dyspnea    Family history of breast cancer    Fracture    right 4th toe   History of breast cancer 07/22/2014   History of radiation therapy  05/16/18- 06/22/18   Right Breast/ 50.4 Gy in 28 fractions. Right posterior axilla and SCV nodes/ 50.4 Gy in 28 fractions.    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 She then received a boost to the left lumpectomy of 10 gray in 5 fractions. This was given at [facility].   Sleep apnea    Status post chemotherapy Comp. 11/23/11   FEC and Taxotere    ALLERGIES:  is allergic to codeine and hydrocodone-acetaminophen.  MEDICATIONS:  Current Outpatient Medications  Medication Sig Dispense Refill   acetaminophen (TYLENOL) 500 MG tablet Take 500 mg by mouth every 6 (six) hours as needed. For pain     amLODipine (NORVASC) 5 MG tablet Take 10 mg by mouth daily.  6   carisoprodol (SOMA) 350 MG tablet Take 350 mg by mouth daily as needed for muscle spasms.     Cyanocobalamin (VITAMIN B-12) 5000 MCG  TBDP Take 5,000 mcg by mouth daily.     ibuprofen (ADVIL) 800 MG tablet Take 1 tablet (800 mg total) by mouth every 8 (eight) hours as needed. 30 tablet 0   lidocaine-prilocaine (EMLA) cream Apply to affected area once 30 g 3   loratadine (CLARITIN) 10 MG tablet Take 10 mg by mouth daily as needed for allergies.     Omega-3 Fatty Acids (FISH OIL MAXIMUM STRENGTH) 1200 MG CPDR Take 1,200 mg by mouth daily.     ondansetron (ZOFRAN) 8 MG tablet Take 1 tablet (8 mg total) by mouth every 8 (eight) hours as needed for nausea or vomiting. 20 tablet 0   ondansetron (ZOFRAN) 8 MG tablet Take 1 tablet (8 mg total)  by mouth every 8 (eight) hours as needed for nausea or vomiting. Take 1 tablets (8mg  total) by mouth every 8hrs as needed. Start on the third day after chemotherapy. (Patient not taking: Reported on 08/01/2023) 30 tablet 1   oxyCODONE-acetaminophen (PERCOCET/ROXICET) 5-325 MG tablet Take 1 tablet by mouth every 8 (eight) hours as needed for severe pain. (Patient not taking: Reported on 08/01/2023) 30 tablet 0   prochlorperazine (COMPAZINE) 10 MG tablet Take 1 tablet (10 mg total) by mouth every 6 (six) hours as needed for nausea or vomiting. (Patient not taking: Reported on 08/01/2023) 30 tablet 1   prochlorperazine (COMPAZINE) 5 MG tablet Take 1 tablet (5 mg total) by mouth every 6 (six) hours as needed for nausea or vomiting. (Patient not taking: Reported on 08/01/2023) 30 tablet 0   traMADol (ULTRAM) 50 MG tablet Take 1 tablet (50 mg total) by mouth every 6 (six) hours as needed. (Patient not taking: Reported on 08/01/2023) 30 tablet 0   Vitamin D, Ergocalciferol, (DRISDOL) 1.25 MG (50000 UNIT) CAPS capsule Take 50,000 Units by mouth once a week.     No current facility-administered medications for this visit.   Facility-Administered Medications Ordered in Other Visits  Medication Dose Route Frequency Provider Last Rate Last Admin   lactated ringers infusion    Continuous PRN Andree Elk, CRNA   New Bag at 11/14/17 0700    SURGICAL HISTORY:  Past Surgical History:  Procedure Laterality Date   anal ascess     turned into a fistula with extensive treatment   AXILLARY LYMPH NODE BIOPSY Right 04/06/2018   AXILLARY LYMPH NODE DISSECTION Right 04/06/2018   Procedure: RIGHT AXILLARY LYMPH NODE DISSECTION;  Surgeon: Emelia Loron, MD;  Location: [facility] OR;  Service: General;  Laterality: Right;   BREAST BIOPSY Left 2012   BREAST BIOPSY Right 2019   BREAST LUMPECTOMY Left 2012   BREAST LUMPECTOMY Right 2019   rt axilla   BREAST SURGERY     CARDIAC ELECTROPHYSIOLOGY MAPPING AND ABLATION      CESAREAN SECTION     COLONOSCOPY N/A 08/14/2014   Procedure: COLONOSCOPY;  Surgeon: Malissa Hippo, MD;  Location: [facility];  Service: Endoscopy;  Laterality: N/A;  930   EVACUATION BREAST HEMATOMA  12/21/2011   Procedure: EVACUATION HEMATOMA BREAST;  Surgeon: Almond Lint, MD;  Location: [facility] OR;  Service: General;  Laterality: Left;   HAND SURGERY     tumor on finger, left hand   INCISION AND DRAINAGE ABSCESS ANAL     x3   IR IMAGING GUIDED PORT INSERTION  08/01/2023   left breast biopsy with axillary biopsy     core bx   PORT-A-CATH REMOVAL  12/21/2011   Procedure: REMOVAL PORT-A-CATH;  Surgeon: Reola Mosher  Streck, MD;  Location: [facility];  Service: General;  Laterality: N/A;   PORTACATH PLACEMENT  08/02/2011   dr Jamey Ripa   PORTACATH PLACEMENT Right 11/14/2017   Procedure: INSERTION PORT-A-CATH WITH [place];  Surgeon: Emelia Loron, MD;  Location: [facility] OR;  Service: General;  Laterality: Right;   removal breast mass  2004   benign   TREATMENT FISTULA ANAL     TUMOR REMOVAL     on left hand   TUMOR REMOVAL     rt. eye    REVIEW OF SYSTEMS:   Review of Systems  Constitutional: Negative for appetite change, chills, fatigue, fever and unexpected weight change.  HENT:   Negative for mouth sores, nosebleeds, sore throat and trouble swallowing.   Eyes: Negative for eye problems and icterus.  Respiratory: Negative for cough, hemoptysis, shortness of breath and wheezing.   Cardiovascular: Negative for chest pain and leg swelling.  Gastrointestinal: Negative for abdominal pain, constipation, diarrhea, nausea and vomiting.  Genitourinary: Negative for bladder incontinence, difficulty urinating, dysuria, frequency and hematuria.   Musculoskeletal: Negative for back pain, gait problem, neck pain and neck stiffness.  Skin: Negative for itching and rash.  Neurological: Negative for dizziness, extremity weakness, gait problem, headaches, light-headedness and seizures.   Hematological: Negative for adenopathy. Does not bruise/bleed easily.  Psychiatric/Behavioral: Negative for confusion, depression and sleep disturbance. The patient is not nervous/anxious.     PHYSICAL EXAMINATION:  Last menstrual period 09/19/2018.  ECOG PERFORMANCE STATUS: {CHL ONC ECOG [id number]  Physical Exam  Constitutional: Oriented to person, place, and time and well-developed, well-nourished, and in no distress. No distress.  HENT:  Head: Normocephalic and atraumatic.  Mouth/Throat: Oropharynx is clear and moist. No oropharyngeal exudate.  Eyes: Conjunctivae are normal. Right eye exhibits no discharge. Left eye exhibits no discharge. No scleral icterus.  Neck: Normal range of motion. Neck supple.  Cardiovascular: Normal rate, regular rhythm, normal heart sounds and intact distal pulses.   Pulmonary/Chest: Effort normal and breath sounds normal. No respiratory distress. No wheezes. No rales.  Abdominal: Soft. Bowel sounds are normal. Exhibits no distension and no mass. There is no tenderness.  Musculoskeletal: Normal range of motion. Exhibits no edema.  Lymphadenopathy:    No cervical adenopathy.  Neurological: Alert and oriented to person, place, and time. Exhibits normal muscle tone. Gait normal. Coordination normal.  Skin: Skin is warm and dry. No rash noted. Not diaphoretic. No erythema. No pallor.  Psychiatric: Mood, memory and judgment normal.  Vitals reviewed.  LABORATORY DATA: Lab Results  Component Value Date   WBC 2.9 (L) 08/22/2023   HGB 10.6 (L) 08/22/2023   HCT 31.9 (L) 08/22/2023   MCV 85.8 08/22/2023   PLT 243 08/22/2023      Chemistry      Component Value Date/Time   NA 137 08/22/2023 1217   NA 140 07/26/2016 1650   K 3.5 08/22/2023 1217   CL 100 08/22/2023 1217   CO2 28 08/22/2023 1217   BUN 20 08/22/2023 1217   BUN 13 07/26/2016 1650   CREATININE 0.85 08/22/2023 1217      Component Value Date/Time   CALCIUM 9.7 08/22/2023 1217    ALKPHOS 81 08/22/2023 1217   AST 14 (L) 08/22/2023 1217   ALT 14 08/22/2023 1217   BILITOT 0.4 08/22/2023 1217       RADIOGRAPHIC STUDIES:  No results found.   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  Reviewed symptomatic management of nausea and vomiting.  IV fluids?  IV fluid shortage?  We will see her back for a follow-up visit in 3 weeks for evaluation before undergoing day 1 cycle 4.   The patient was advised to call immediately if she has any concerning symptoms in the interval. The patient voices understanding of current disease status and treatment options and is in agreement with the current care plan. All questions were answered. The patient knows to call the clinic with any problems, questions or concerns. We can certainly see the patient much sooner if necessary   No orders of the defined types were placed in this encounter.    I spent {CHL ONC TIME VISIT - WUJWJ:[id number]} counseling the patient face to face. The total time spent in the appointment was {CHL ONC TIME VISIT - OZHYQ:[id number]}.  Aasia Peavler L Wynell Halberg, PA-C 09/02/23

## 2023-09-05 ENCOUNTER — Inpatient Hospital Stay: Payer: 59 | Attending: Hematology and Oncology

## 2023-09-05 ENCOUNTER — Inpatient Hospital Stay: Payer: 59

## 2023-09-05 ENCOUNTER — Inpatient Hospital Stay (HOSPITAL_BASED_OUTPATIENT_CLINIC_OR_DEPARTMENT_OTHER): Payer: 59 | Admitting: Physician Assistant

## 2023-09-05 VITALS — BP 135/83 | HR 93 | Temp 97.5°F | Resp 17 | Wt 210.9 lb

## 2023-09-05 VITALS — BP 143/92 | HR 98 | Temp 98.2°F | Resp 20

## 2023-09-05 DIAGNOSIS — Z171 Estrogen receptor negative status [ER-]: Secondary | ICD-10-CM

## 2023-09-05 DIAGNOSIS — C50012 Malignant neoplasm of nipple and areola, left female breast: Secondary | ICD-10-CM

## 2023-09-05 DIAGNOSIS — C771 Secondary and unspecified malignant neoplasm of intrathoracic lymph nodes: Secondary | ICD-10-CM | POA: Insufficient documentation

## 2023-09-05 DIAGNOSIS — N6331 Unspecified lump in axillary tail of the right breast: Secondary | ICD-10-CM

## 2023-09-05 DIAGNOSIS — Z9221 Personal history of antineoplastic chemotherapy: Secondary | ICD-10-CM | POA: Insufficient documentation

## 2023-09-05 DIAGNOSIS — Z5111 Encounter for antineoplastic chemotherapy: Secondary | ICD-10-CM | POA: Insufficient documentation

## 2023-09-05 DIAGNOSIS — Z79899 Other long term (current) drug therapy: Secondary | ICD-10-CM | POA: Insufficient documentation

## 2023-09-05 DIAGNOSIS — C773 Secondary and unspecified malignant neoplasm of axilla and upper limb lymph nodes: Secondary | ICD-10-CM | POA: Insufficient documentation

## 2023-09-05 DIAGNOSIS — C50919 Malignant neoplasm of unspecified site of unspecified female breast: Secondary | ICD-10-CM

## 2023-09-05 DIAGNOSIS — C50611 Malignant neoplasm of axillary tail of right female breast: Secondary | ICD-10-CM

## 2023-09-05 DIAGNOSIS — G4730 Sleep apnea, unspecified: Secondary | ICD-10-CM | POA: Insufficient documentation

## 2023-09-05 DIAGNOSIS — R112 Nausea with vomiting, unspecified: Secondary | ICD-10-CM | POA: Insufficient documentation

## 2023-09-05 DIAGNOSIS — Z923 Personal history of irradiation: Secondary | ICD-10-CM | POA: Diagnosis not present

## 2023-09-05 DIAGNOSIS — Z95828 Presence of other vascular implants and grafts: Secondary | ICD-10-CM

## 2023-09-05 LAB — CBC WITH DIFFERENTIAL (CANCER CENTER ONLY)
Abs Immature Granulocytes: 0.03 10*3/uL (ref 0.00–0.07)
Basophils Absolute: 0 10*3/uL (ref 0.0–0.1)
Basophils Relative: 0 %
Eosinophils Absolute: 0.1 10*3/uL (ref 0.0–0.5)
Eosinophils Relative: 2 %
HCT: 31.2 % — ABNORMAL LOW (ref 36.0–46.0)
Hemoglobin: 10.3 g/dL — ABNORMAL LOW (ref 12.0–15.0)
Immature Granulocytes: 1 %
Lymphocytes Relative: 13 %
Lymphs Abs: 0.3 10*3/uL — ABNORMAL LOW (ref 0.7–4.0)
MCH: 28.6 pg (ref 26.0–34.0)
MCHC: 33 g/dL (ref 30.0–36.0)
MCV: 86.7 fL (ref 80.0–100.0)
Monocytes Absolute: 0.3 10*3/uL (ref 0.1–1.0)
Monocytes Relative: 14 %
Neutro Abs: 1.6 10*3/uL — ABNORMAL LOW (ref 1.7–7.7)
Neutrophils Relative %: 70 %
Platelet Count: 218 10*3/uL (ref 150–400)
RBC: 3.6 MIL/uL — ABNORMAL LOW (ref 3.87–5.11)
RDW: 15.4 % (ref 11.5–15.5)
WBC Count: 2.3 10*3/uL — ABNORMAL LOW (ref 4.0–10.5)
nRBC: 0 % (ref 0.0–0.2)

## 2023-09-05 LAB — CMP (CANCER CENTER ONLY)
ALT: 12 U/L (ref 0–44)
AST: 13 U/L — ABNORMAL LOW (ref 15–41)
Albumin: 4 g/dL (ref 3.5–5.0)
Alkaline Phosphatase: 86 U/L (ref 38–126)
Anion gap: 7 (ref 5–15)
BUN: 14 mg/dL (ref 6–20)
CO2: 30 mmol/L (ref 22–32)
Calcium: 9.4 mg/dL (ref 8.9–10.3)
Chloride: 101 mmol/L (ref 98–111)
Creatinine: 1.01 mg/dL — ABNORMAL HIGH (ref 0.44–1.00)
GFR, Estimated: >60 mL/min (ref >60)
Glucose, Bld: 105 mg/dL — ABNORMAL HIGH (ref 70–99)
Potassium: 3.6 mmol/L (ref 3.5–5.1)
Sodium: 138 mmol/L (ref 135–145)
Total Bilirubin: 0.3 mg/dL (ref <1.2)
Total Protein: 7.3 g/dL (ref 6.5–8.1)

## 2023-09-05 LAB — PHOSPHORUS: Phosphorus: 3.8 mg/dL (ref 2.5–4.6)

## 2023-09-05 LAB — MAGNESIUM: Magnesium: 1.6 mg/dL — ABNORMAL LOW (ref 1.7–2.4)

## 2023-09-05 MED ORDER — SODIUM CHLORIDE 0.9% FLUSH
10.0000 mL | INTRAVENOUS | Status: DC | PRN
Start: 2023-09-05 — End: 2023-09-05
  Administered 2023-09-05: 10 mL

## 2023-09-05 MED ORDER — FAMOTIDINE IN NACL 20-0.9 MG/50ML-% IV SOLN
20.0000 mg | Freq: Once | INTRAVENOUS | Status: AC
  Administered 2023-09-05: 20 mg via INTRAVENOUS
  Filled 2023-09-05: qty 50

## 2023-09-05 MED ORDER — SODIUM CHLORIDE 0.9% FLUSH
10.0000 mL | Freq: Once | INTRAVENOUS | Status: AC
  Administered 2023-09-05: 10 mL

## 2023-09-05 MED ORDER — ONDANSETRON HCL 8 MG PO TABS: 8.0000 mg | ORAL_TABLET | Freq: Three times a day (TID) | ORAL | 1 refills | Status: DC | PRN

## 2023-09-05 MED ORDER — ACETAMINOPHEN 325 MG PO TABS
650.0000 mg | ORAL_TABLET | Freq: Once | ORAL | Status: AC
  Administered 2023-09-05: 650 mg via ORAL
  Filled 2023-09-05: qty 2

## 2023-09-05 MED ORDER — BENZONATATE 100 MG PO CAPS: 100.0000 mg | ORAL_CAPSULE | Freq: Three times a day (TID) | ORAL | 2 refills | Status: DC | PRN

## 2023-09-05 MED ORDER — SACITUZUMAB GOVITECAN-HZIY CHEMO 180 MG IV SOLR
900.0000 mg | Freq: Once | INTRAVENOUS | Status: AC
  Administered 2023-09-05: 900 mg via INTRAVENOUS
  Filled 2023-09-05: qty 90

## 2023-09-05 MED ORDER — SODIUM CHLORIDE 0.9 % IV SOLN: Freq: Once | INTRAVENOUS | Status: AC

## 2023-09-05 MED ORDER — SODIUM CHLORIDE 0.9 % IV SOLN
150.0000 mg | Freq: Once | INTRAVENOUS | Status: AC
  Administered 2023-09-05: 150 mg via INTRAVENOUS
  Filled 2023-09-05: qty 150

## 2023-09-05 MED ORDER — DEXAMETHASONE SODIUM PHOSPHATE 10 MG/ML IJ SOLN
10.0000 mg | Freq: Once | INTRAMUSCULAR | Status: AC
Start: 2023-09-05 — End: 2023-09-05
  Administered 2023-09-05: 10 mg via INTRAVENOUS
  Filled 2023-09-05: qty 1

## 2023-09-05 MED ORDER — PALONOSETRON HCL INJECTION 0.25 MG/5ML
0.2500 mg | Freq: Once | INTRAVENOUS | Status: AC
  Administered 2023-09-05: 0.25 mg via INTRAVENOUS
  Filled 2023-09-05: qty 5

## 2023-09-05 MED ORDER — DIPHENHYDRAMINE HCL 50 MG/ML IJ SOLN
50.0000 mg | Freq: Once | INTRAMUSCULAR | Status: AC
  Administered 2023-09-05: 50 mg via INTRAVENOUS
  Filled 2023-09-05: qty 1

## 2023-09-05 MED ORDER — ATROPINE SULFATE 1 MG/ML IV SOLN
0.5000 mg | Freq: Once | INTRAVENOUS | Status: DC | PRN
  Filled 2023-09-05: qty 1

## 2023-09-05 MED ORDER — MAGNESIUM OXIDE -MG SUPPLEMENT 400 (240 MG) MG PO TABS: 400.0000 mg | ORAL_TABLET | Freq: Every day | ORAL | 1 refills | Status: DC

## 2023-09-05 MED ORDER — HEPARIN SOD (PORK) LOCK FLUSH 100 UNIT/ML IV SOLN
500.0000 [IU] | Freq: Once | INTRAVENOUS | Status: AC | PRN
Start: 2023-09-05 — End: 2023-09-05
  Administered 2023-09-05: 500 [IU]

## 2023-09-05 NOTE — Patient Instructions (Addendum)
[facility] - A DEPT OF [facility]  Discharge Instructions: Thank you for choosing [facility] to provide your oncology and hematology care.   If you have a lab appointment with the Cancer Center, please go directly to the Cancer Center and check in at the registration area.   Wear comfortable clothing and clothing appropriate for easy access to any Portacath or PICC line.   We strive to give you quality time with your provider. You may need to reschedule your appointment if you arrive late (15 or more minutes).  Arriving late affects you and other patients whose appointments are after yours.  Also, if you miss three or more appointments without notifying the office, you may be dismissed from the clinic at the provider's discretion.      For prescription refill requests, have your pharmacy contact our office and allow 72 hours for refills to be completed.    Today you received the following chemotherapy and/or immunotherapy agent: Sacituzumab [name])      To help prevent nausea and vomiting after your treatment, we encourage you to take your nausea medication as directed.  BELOW ARE SYMPTOMS THAT SHOULD BE REPORTED IMMEDIATELY: *FEVER GREATER THAN 100.4 F (38 C) OR HIGHER *CHILLS OR SWEATING *NAUSEA AND VOMITING THAT IS NOT CONTROLLED WITH YOUR NAUSEA MEDICATION *UNUSUAL SHORTNESS OF BREATH *UNUSUAL BRUISING OR BLEEDING *URINARY PROBLEMS (pain or burning when urinating, or frequent urination) *BOWEL PROBLEMS (unusual diarrhea, constipation, pain near the anus) TENDERNESS IN MOUTH AND THROAT WITH OR WITHOUT PRESENCE OF ULCERS (sore throat, sores in mouth, or a toothache) UNUSUAL RASH, SWELLING OR PAIN  UNUSUAL VAGINAL DISCHARGE OR ITCHING   Items with * indicate a potential emergency and should be followed up as soon as possible or go to the Emergency Department if any problems should occur.  Please show the CHEMOTHERAPY ALERT CARD or  IMMUNOTHERAPY ALERT CARD at check-in to the Emergency Department and triage nurse.  Should you have questions after your visit or need to cancel or reschedule your appointment, please contact [facility] - A DEPT OF [facility]  Dept: [phone]  and follow the prompts.  Office hours are 8:00 a.m. to 4:30 p.m. Monday - Friday. Please note that voicemails left after 4:00 p.m. may not be returned until the following business day.  We are closed weekends and major holidays. You have access to a nurse at all times for urgent questions. Please call the main number to the clinic Dept: [phone] and follow the prompts.   For any non-urgent questions, you may also contact your provider using MyChart. We now offer e-Visits for anyone 22 and older to request care online for non-urgent symptoms. For details visit [email or web address].   Also download the MyChart app! Go to the app store, search "MyChart", open the app, select [facility], and log in with your MyChart username and password.  Sacituzumab Govitecan Injection What is this medication? SACITUZUMAB GOVITECAN (SAK i TOOZ ue mab GOE vi TEE kan) treats breast cancer. It may also be used to treat bladder cancer and kidney cancer. It works by blocking a protein that causes cancer cells to grow and multiply. This helps to slow or stop the spread of cancer cells. This medicine may be used for other purposes; ask your health care provider or pharmacist if you have questions. COMMON BRAND NAME(S): TRODELVY What should I tell my care team before I take this medication? They need  to know if you have any of these conditions: Carry the UGT1A1*28 gene Infection Liver disease An unusual or allergic reaction to sacituzumab govitecan, other medications, foods, dyes, or preservatives Pregnant or trying to get pregnant Breast-feeding How should I use this medication? This medication is injected into a vein. It is given by  your care team in a hospital or clinic setting. Talk to your care team about the use of this medication in children. Special care may be needed. Overdosage: If you think you have taken too much of this medicine contact a poison control center or emergency room at once. NOTE: This medicine is only for you. Do not share this medicine with others. What if I miss a dose? Keep appointments for follow-up doses. It is important not to miss your dose. Call your care team if you are unable to keep an appointment. What may interact with this medication? This medication may affect how other medications work, and other medications may affect the way this medication works. Talk with your care team about all of the medications you take. They may suggest changes to your treatment plan to lower the risk of side effects and to make sure your medications work as intended. This list may not describe all possible interactions. Give your health care provider a list of all the medicines, herbs, non-prescription drugs, or dietary supplements you use. Also tell them if you smoke, drink alcohol, or use illegal drugs. Some items may interact with your medicine. What should I watch for while using this medication? This medication may make you feel generally unwell. This is not uncommon as chemotherapy can affect healthy cells as well as cancer cells. Report any side effects. Continue your course of treatment even though you feel ill unless your care team tells you to stop. You may need blood work while you are taking this medication. Certain genetic factors may decrease the safety of this medication. Your care team may use genetic tests to determine treatment. This medication can cause serious allergic reactions. To reduce your risk, your care team may give you other medications to take before receiving this one. Be sure to follow the directions from your care team. Check with your care team if you have severe diarrhea, nausea,  and vomiting, or if you sweat a lot. The loss of too much body fluid may make it dangerous for you to take this medication. Talk to your care team if you wish to become pregnant or think you might be pregnant. This medication can cause serious birth defects if taken during pregnancy or if you get pregnant within 6 months after stopping treatment. A negative pregnancy test is required before starting this medication. A reliable form of contraception is recommended while taking this medication and for 6 months after stopping treatment. Talk to your care team about reliable forms of contraception. Use a condom during sex and for 3 months after stopping treatment. Tell your care team right away if you think your partner might be pregnant. This medication can cause serious birth defects. Do not breast-feed while taking this medication and for 1 month after stopping therapy. This medication may cause infertility. Talk to your care team if you are concerned about your fertility. This medication may increase your risk of getting an infection. Call your care team for advice if you get a fever, chills, sore throat, or other symptoms of a cold or flu. Do not treat yourself. Try to avoid being around people who are sick. Avoid taking  medications that contain aspirin, acetaminophen, ibuprofen, naproxen, or ketoprofen unless instructed by your care team. These medications may hide a fever. This medication may increase blood sugar. The risk may be higher in patients who already have diabetes. Ask your care team what you can do to lower your risk of diabetes while taking this medication. What side effects may I notice from receiving this medication? Side effects that you should report to your care team as soon as possible: Allergic reactions--skin rash, itching, hives, swelling of the face, lips, tongue, or throat Infection--fever, chills, cough, or sore throat Infusion reactions--chest pain, shortness of breath or  trouble breathing, feeling faint or lightheaded Low red blood cell level--unusual weakness or fatigue, dizziness, headache, trouble breathing Severe or prolonged diarrhea Side effects that usually do not require medical attention (report these to your care team if they continue or are bothersome): Constipation Diarrhea Fatigue Hair loss Loss of appetite Nausea Vomiting This list may not describe all possible side effects. Call your doctor for medical advice about side effects. You may report side effects to FDA at 1-800-FDA-1088. Where should I keep my medication? This medication is given in a hospital or clinic. It will not be stored at home. NOTE: This sheet is a summary. It may not cover all possible information. If you have questions about this medicine, talk to your doctor, pharmacist, or health care provider.  2024 Elsevier/Gold Standard (2022-02-23 00:00:00)  Hypomagnesemia Hypomagnesemia is a condition in which the level of magnesium in the blood is too low. Magnesium is a mineral that is found in many foods. It is used in many different processes in the body. Hypomagnesemia can affect every organ in the body. In severe cases, it can cause life-threatening problems. What are the causes? This condition may be caused by: Not getting enough magnesium in your diet or not having enough healthy foods to eat (malnutrition). Problems with magnesium absorption in the intestines. Dehydration. Excessive use of alcohol. Vomiting. Severe or long-term (chronic) diarrhea. Some medicines, including medicines that make you urinate more often (diuretics). Certain diseases, such as kidney disease, diabetes, celiac disease, and overactive thyroid. What are the signs or symptoms? Symptoms of this condition include: Loss of appetite, nausea, and vomiting. Involuntary shaking or trembling of a body part (tremor). Muscle weakness or tingling in the arms and legs. Sudden tightening of muscles  (muscle spasms). Confusion. Psychiatric issues, such as: Depression and irritability. Psychosis. A feeling of fluttering of the heart (palpitations). Seizures. These symptoms are more severe if magnesium levels drop suddenly. How is this diagnosed? This condition may be diagnosed based on: Your symptoms and medical history. A physical exam. Blood and urine tests. How is this treated? Treatment depends on the cause and the severity of the condition. It may be treated by: Taking a magnesium supplement. This can be taken in pill form. If the condition is severe, magnesium is usually given through an IV. Making changes to your diet. You may be directed to eat foods that have a lot of magnesium, such as green leafy vegetables, peas, beans, and nuts. Not drinking alcohol. If you are struggling not to drink, ask your health care provider for help. Follow these instructions at home: Eating and drinking     Make sure that your diet includes foods with magnesium. Foods that have a lot of magnesium in them include: Green leafy vegetables, such as spinach and broccoli. Beans and peas. Nuts and seeds, such as almonds and sunflower seeds. Whole grains, such as  whole grain bread and fortified cereals. Drink fluids that contain salts and minerals (electrolytes), such as sports drinks, when you are active. Do not drink alcohol. General instructions Take over-the-counter and prescription medicines only as told by your health care provider. Take magnesium supplements as directed if your health care provider tells you to take them. Have your magnesium levels monitored as told by your health care provider. Keep all follow-up visits. This is important. Contact a health care provider if: You get worse instead of better. Your symptoms return. Get help right away if: You develop severe muscle weakness. You have trouble breathing. You feel that your heart is racing. These symptoms may represent a  serious problem that is an emergency. Do not wait to see if the symptoms will go away. Get medical help right away. Call your local emergency services (911 in the [place] Do not drive yourself to the hospital. Summary Hypomagnesemia is a condition in which the level of magnesium in the blood is too low. Hypomagnesemia can affect every organ in the body. Treatment may include eating more foods that contain magnesium, taking magnesium supplements, and not drinking alcohol. Have your magnesium levels monitored as told by your health care provider. This information is not intended to replace advice given to you by your health care provider. Make sure you discuss any questions you have with your health care provider. Document Revised: 03/10/2021 Document Reviewed: 03/10/2021 Elsevier Patient Education  2024 Elsevier Inc.  Pick up Magnesium prescription and take 1 tablet daily as prescribed.  Hydrate well with water.

## 2023-09-05 NOTE — Progress Notes (Signed)
Pt. states she has been tolerating treatment and declines to stay for 30 minute post observation. Vital signs stable and left via ambulation with no respiratory distress noted.

## 2023-09-08 ENCOUNTER — Other Ambulatory Visit: Payer: Self-pay | Admitting: *Deleted

## 2023-09-08 ENCOUNTER — Inpatient Hospital Stay: Payer: 59

## 2023-09-08 DIAGNOSIS — C50919 Malignant neoplasm of unspecified site of unspecified female breast: Secondary | ICD-10-CM

## 2023-09-08 NOTE — Progress Notes (Signed)
[facility] CSW Counseling Note  Patient was referred by self. Treatment type: Individual  Presenting Concerns: Patient and/or family reports the following symptoms/concerns: agitation and anxiety Duration of problem: 1 year; Severity of problem: mild   Orientation:oriented to person, place, and time/date.   Affect: Congruent Risk of harm to self or others: No plan to harm self or others  Patient and/or Family's Strengths/Protective Factors: Social connections, Social and Emotional competence, Concrete supports in place (healthy food, safe environments, etc.), and Physical Health (exercise, healthy diet, medication compliance, etc.)Ability for insight  Active sense of humor  Average or above average intelligence  Capable of independent living  Arboriculturist fund of knowledge  Motivation for treatment/growth  Physical Health  Supportive family/friends      Goals Addressed: Patient will:  Reduce symptoms of: anxiety Increase knowledge and/or ability of: coping skills and stress reduction  Increase healthy adjustment to current life circumstances   Progress towards Goals: Progressing   Interventions: Interventions utilized:  CBT and Reframing      Assessment: Patient currently experiencing continued feelings with adjustment to relational impact of diagnosis.      Plan: Follow up with CSW: 2 weeks on 12/05 Referral(s): CSW discussed with patient Palliative Care and sent referral to nurse to for oncologist opinion on if referral is appropriate at this time.C       Marguerita Merles, [address]

## 2023-09-08 NOTE — Progress Notes (Signed)
Received message from social work team stating pt is interested in palliative care for pain management and management of ongoing nausea.  RN reviewed with MD and orders placed for [facility] palliative care.

## 2023-09-09 MED FILL — Fosaprepitant Dimeglumine For IV Infusion 150 MG (Base Eq): INTRAVENOUS | Qty: 5 | Status: AC

## 2023-09-12 ENCOUNTER — Inpatient Hospital Stay: Payer: 59

## 2023-09-12 ENCOUNTER — Other Ambulatory Visit: Payer: Self-pay

## 2023-09-12 VITALS — BP 136/86 | HR 102 | Temp 98.4°F | Resp 20 | Ht 66.0 in | Wt 209.2 lb

## 2023-09-12 DIAGNOSIS — C50919 Malignant neoplasm of unspecified site of unspecified female breast: Secondary | ICD-10-CM

## 2023-09-12 DIAGNOSIS — N6331 Unspecified lump in axillary tail of the right breast: Secondary | ICD-10-CM

## 2023-09-12 DIAGNOSIS — C50012 Malignant neoplasm of nipple and areola, left female breast: Secondary | ICD-10-CM | POA: Diagnosis not present

## 2023-09-12 LAB — CBC WITH DIFFERENTIAL (CANCER CENTER ONLY)
Abs Immature Granulocytes: 0.05 10*3/uL (ref 0.00–0.07)
Basophils Absolute: 0 10*3/uL (ref 0.0–0.1)
Basophils Relative: 0 %
Eosinophils Absolute: 0.1 10*3/uL (ref 0.0–0.5)
Eosinophils Relative: 3 %
HCT: 31.3 % — ABNORMAL LOW (ref 36.0–46.0)
Hemoglobin: 10.3 g/dL — ABNORMAL LOW (ref 12.0–15.0)
Immature Granulocytes: 2 %
Lymphocytes Relative: 10 %
Lymphs Abs: 0.2 10*3/uL — ABNORMAL LOW (ref 0.7–4.0)
MCH: 28.9 pg (ref 26.0–34.0)
MCHC: 32.9 g/dL (ref 30.0–36.0)
MCV: 87.9 fL (ref 80.0–100.0)
Monocytes Absolute: 0.3 10*3/uL (ref 0.1–1.0)
Monocytes Relative: 13 %
Neutro Abs: 1.7 10*3/uL (ref 1.7–7.7)
Neutrophils Relative %: 72 %
Platelet Count: 210 10*3/uL (ref 150–400)
RBC: 3.56 MIL/uL — ABNORMAL LOW (ref 3.87–5.11)
RDW: 15.6 % — ABNORMAL HIGH (ref 11.5–15.5)
WBC Count: 2.4 10*3/uL — ABNORMAL LOW (ref 4.0–10.5)
nRBC: 0 % (ref 0.0–0.2)

## 2023-09-12 LAB — MAGNESIUM: Magnesium: 1.7 mg/dL (ref 1.7–2.4)

## 2023-09-12 LAB — CMP (CANCER CENTER ONLY)
ALT: 12 U/L (ref 0–44)
AST: 12 U/L — ABNORMAL LOW (ref 15–41)
Albumin: 4 g/dL (ref 3.5–5.0)
Alkaline Phosphatase: 75 U/L (ref 38–126)
Anion gap: 8 (ref 5–15)
BUN: 17 mg/dL (ref 6–20)
CO2: 27 mmol/L (ref 22–32)
Calcium: 9.4 mg/dL (ref 8.9–10.3)
Chloride: 102 mmol/L (ref 98–111)
Creatinine: 0.9 mg/dL (ref 0.44–1.00)
GFR, Estimated: >60 mL/min (ref >60)
Glucose, Bld: 129 mg/dL — ABNORMAL HIGH (ref 70–99)
Potassium: 3.4 mmol/L — ABNORMAL LOW (ref 3.5–5.1)
Sodium: 137 mmol/L (ref 135–145)
Total Bilirubin: 0.3 mg/dL (ref <1.2)
Total Protein: 7.3 g/dL (ref 6.5–8.1)

## 2023-09-12 LAB — PHOSPHORUS: Phosphorus: 3.6 mg/dL (ref 2.5–4.6)

## 2023-09-12 MED ORDER — FAMOTIDINE IN NACL 20-0.9 MG/50ML-% IV SOLN
20.0000 mg | Freq: Once | INTRAVENOUS | Status: AC
  Administered 2023-09-12: 20 mg via INTRAVENOUS
  Filled 2023-09-12: qty 50

## 2023-09-12 MED ORDER — DIPHENHYDRAMINE HCL 50 MG/ML IJ SOLN
50.0000 mg | Freq: Once | INTRAMUSCULAR | Status: AC
  Administered 2023-09-12: 50 mg via INTRAVENOUS
  Filled 2023-09-12: qty 1

## 2023-09-12 MED ORDER — SODIUM CHLORIDE 0.9 % IV SOLN: Freq: Once | INTRAVENOUS | Status: AC

## 2023-09-12 MED ORDER — PALONOSETRON HCL INJECTION 0.25 MG/5ML
0.2500 mg | Freq: Once | INTRAVENOUS | Status: AC
  Administered 2023-09-12: 0.25 mg via INTRAVENOUS
  Filled 2023-09-12: qty 5

## 2023-09-12 MED ORDER — SODIUM CHLORIDE 0.9% FLUSH
10.0000 mL | INTRAVENOUS | Status: DC | PRN
Start: 2023-09-12 — End: 2023-09-12
  Administered 2023-09-12: 10 mL

## 2023-09-12 MED ORDER — DEXAMETHASONE SODIUM PHOSPHATE 10 MG/ML IJ SOLN
10.0000 mg | Freq: Once | INTRAMUSCULAR | Status: AC
  Administered 2023-09-12: 10 mg via INTRAVENOUS
  Filled 2023-09-12: qty 1

## 2023-09-12 MED ORDER — ACETAMINOPHEN 325 MG PO TABS
650.0000 mg | ORAL_TABLET | Freq: Once | ORAL | Status: AC
  Administered 2023-09-12: 650 mg via ORAL
  Filled 2023-09-12: qty 2

## 2023-09-12 MED ORDER — SODIUM CHLORIDE 0.9 % IV SOLN
900.0000 mg | Freq: Once | INTRAVENOUS | Status: AC
  Administered 2023-09-12: 900 mg via INTRAVENOUS
  Filled 2023-09-12: qty 90

## 2023-09-12 MED ORDER — SODIUM CHLORIDE 0.9 % IV SOLN
150.0000 mg | Freq: Once | INTRAVENOUS | Status: AC
  Administered 2023-09-12: 150 mg via INTRAVENOUS
  Filled 2023-09-12: qty 150

## 2023-09-12 MED ORDER — HEPARIN SOD (PORK) LOCK FLUSH 100 UNIT/ML IV SOLN
500.0000 [IU] | Freq: Once | INTRAVENOUS | Status: AC | PRN
Start: 2023-09-12 — End: 2023-09-12
  Administered 2023-09-12: 500 [IU]

## 2023-09-12 NOTE — Progress Notes (Signed)
Patient declined to stay for 30 minutes post observation following Trodelvy. Patient tolerated treatment well, VSS at discharge. Patient discharged ambulatory to lobby, no complaints.

## 2023-09-12 NOTE — Patient Instructions (Signed)
 [facility] - A DEPT OF [facility]  Discharge Instructions: Thank you for choosing [facility] to provide your oncology and hematology care.   If you have a lab appointment with the [facility], please go directly to the [facility] and check in at the registration area.   Wear comfortable clothing and clothing appropriate for easy access to any Portacath or PICC line.   We strive to give you quality time with your provider. You may need to reschedule your appointment if you arrive late (15 or more minutes).  Arriving late affects you and other patients whose appointments are after yours.  Also, if you miss three or more appointments without notifying the office, you may be dismissed from the clinic at the provider's discretion.      For prescription refill requests, have your pharmacy contact our office and allow 72 hours for refills to be completed.    Today you received the following chemotherapy and/or immunotherapy agents: [name]      To help prevent nausea and vomiting after your treatment, we encourage you to take your nausea medication as directed.  BELOW ARE SYMPTOMS THAT SHOULD BE REPORTED IMMEDIATELY: *FEVER GREATER THAN 100.4 F (38 C) OR HIGHER *CHILLS OR SWEATING *NAUSEA AND VOMITING THAT IS NOT CONTROLLED WITH YOUR NAUSEA MEDICATION *UNUSUAL SHORTNESS OF BREATH *UNUSUAL BRUISING OR BLEEDING *URINARY PROBLEMS (pain or burning when urinating, or frequent urination) *BOWEL PROBLEMS (unusual diarrhea, constipation, pain near the anus) TENDERNESS IN MOUTH AND THROAT WITH OR WITHOUT PRESENCE OF ULCERS (sore throat, sores in mouth, or a toothache) UNUSUAL RASH, SWELLING OR PAIN  UNUSUAL VAGINAL DISCHARGE OR ITCHING   Items with * indicate a potential emergency and should be followed up as soon as possible or go to the Emergency Department if any problems should occur.  Please show the CHEMOTHERAPY ALERT CARD or IMMUNOTHERAPY  ALERT CARD at check-in to the Emergency Department and triage nurse.  Should you have questions after your visit or need to cancel or reschedule your appointment, please contact [facility] - A DEPT OF [facility]  Dept: [phone]  and follow the prompts.  Office hours are 8:00 a.m. to 4:30 p.m. Monday - Friday. Please note that voicemails left after 4:00 p.m. may not be returned until the following business day.  We are closed weekends and major holidays. You have access to a nurse at all times for urgent questions. Please call the main number to the clinic Dept: [phone] and follow the prompts.   For any non-urgent questions, you may also contact your provider using MyChart. We now offer e-Visits for anyone 34 and older to request care online for non-urgent symptoms. For details visit [email or web address].   Also download the MyChart app! Go to the app store, search "MyChart", open the app, select , and log in with your MyChart username and password.

## 2023-09-12 NOTE — Progress Notes (Signed)
Per Dr Pamelia Hoit, okay to proceed with pre-medications while waiting for phosphorus levels to result.

## 2023-09-19 ENCOUNTER — Encounter (HOSPITAL_COMMUNITY): Payer: Self-pay

## 2023-09-19 ENCOUNTER — Ambulatory Visit (HOSPITAL_COMMUNITY)
Admission: RE | Admit: 2023-09-19 | Discharge: 2023-09-19 | Disposition: A | Discharge status: Home / Self Care | Payer: 59 | Source: Ambulatory Visit | Attending: Physician Assistant | Admitting: Physician Assistant

## 2023-09-19 DIAGNOSIS — C50012 Malignant neoplasm of nipple and areola, left female breast: Secondary | ICD-10-CM | POA: Diagnosis present

## 2023-09-19 MED ORDER — IOHEXOL 300 MG/ML  SOLN
100.0000 mL | Freq: Once | INTRAMUSCULAR | Status: AC | PRN
  Administered 2023-09-19: 100 mL via INTRAVENOUS

## 2023-09-20 ENCOUNTER — Encounter: Payer: Self-pay | Admitting: Internal Medicine

## 2023-09-20 ENCOUNTER — Ambulatory Visit: Payer: 59 | Attending: Internal Medicine | Admitting: Internal Medicine

## 2023-09-20 VITALS — BP 118/68 | HR 76 | Ht 66.0 in | Wt 207.0 lb

## 2023-09-20 DIAGNOSIS — R0602 Shortness of breath: Secondary | ICD-10-CM | POA: Diagnosis not present

## 2023-09-20 DIAGNOSIS — I459 Conduction disorder, unspecified: Secondary | ICD-10-CM | POA: Diagnosis not present

## 2023-09-20 DIAGNOSIS — I10 Essential (primary) hypertension: Secondary | ICD-10-CM | POA: Diagnosis not present

## 2023-09-20 DIAGNOSIS — I495 Sick sinus syndrome: Secondary | ICD-10-CM

## 2023-09-20 LAB — CUP PACEART INCLINIC DEVICE CHECK
Battery Remaining Longevity: 135 mo
Battery Voltage: 3.06 V
Brady Statistic AP VP Percent: 5.88 %
Brady Statistic AP VS Percent: 0 %
Brady Statistic AS VP Percent: 94.04 %
Brady Statistic AS VS Percent: 0.03 %
Brady Statistic RA Percent Paced: 10.75 %
Brady Statistic RV Percent Paced: 99.96 %
Date Time Interrogation Session: 20241126132854
Implantable Lead Connection Status: 753985
Implantable Lead Connection Status: 753985
Implantable Lead Implant Date: 20240417
Implantable Lead Implant Date: 20240417
Implantable Lead Location: 753859
Implantable Lead Location: 753860
Implantable Lead Model: 3830
Implantable Lead Model: 5076
Implantable Pulse Generator Implant Date: 20240417
Lead Channel Impedance Value: 285 Ohm
Lead Channel Impedance Value: 304 Ohm
Lead Channel Impedance Value: 456 Ohm
Lead Channel Impedance Value: 589 Ohm
Lead Channel Pacing Threshold Amplitude: 0 V
Lead Channel Pacing Threshold Amplitude: 0.5 V
Lead Channel Pacing Threshold Amplitude: 1.125 V
Lead Channel Pacing Threshold Pulse Width: 0 ms
Lead Channel Pacing Threshold Pulse Width: 0.4 ms
Lead Channel Pacing Threshold Pulse Width: 0.4 ms
Lead Channel Sensing Intrinsic Amplitude: 1 mV
Lead Channel Sensing Intrinsic Amplitude: 1.375 mV
Lead Channel Sensing Intrinsic Amplitude: 6 mV
Lead Channel Sensing Intrinsic Amplitude: 9.25 mV
Lead Channel Setting Pacing Amplitude: 1.5 V
Lead Channel Setting Pacing Amplitude: 2.25 V
Lead Channel Setting Pacing Pulse Width: 0.4 ms
Lead Channel Setting Sensing Sensitivity: 0.9 mV
Zone Setting Status: 755011
Zone Setting Status: 755011

## 2023-09-20 MED ORDER — METOPROLOL SUCCINATE ER 25 MG PO TB24: 25.0000 mg | ORAL_TABLET | Freq: Every day | ORAL | 3 refills | Status: DC

## 2023-09-20 NOTE — Patient Instructions (Signed)
Medication Instructions:  Your physician has recommended you make the following change in your medication:   ** Begin Metoprolol Succinate 25mg  - 1 tablet by mouth daily  *If you need a refill on your cardiac medications before your next appointment, please call your pharmacy*   Lab Work: None ordered.  If you have labs (blood work) drawn today and your tests are completely normal, you will receive your results only by: MyChart Message (if you have MyChart) OR A paper copy in the mail If you have any lab test that is abnormal or we need to change your treatment, we will call you to review the results.   Testing/Procedures: Your physician has requested that you have an echocardiogram. Echocardiography is a painless test that uses sound waves to create images of your heart. It provides your doctor with information about the size and shape of your heart and how well your heart's chambers and valves are working. This procedure takes approximately one hour. There are no restrictions for this procedure. Please do NOT wear cologne, perfume, aftershave, or lotions (deodorant is allowed). Please arrive 15 minutes prior to your appointment time.  Please note: We ask at that you not bring children with you during ultrasound (echo/ vascular) testing. Due to room size and safety concerns, children are not allowed in the ultrasound rooms during exams. Our front office staff cannot provide observation of children in our lobby area while testing is being conducted. An adult accompanying a patient to their appointment will only be allowed in the ultrasound room at the discretion of the ultrasound technician under special circumstances. We apologize for any inconvenience.    Follow-Up: At [facility], you and your health needs are our priority.  As part of our continuing mission to provide you with exceptional heart care, we have created designated Provider Care Teams.  These Care Teams include  your primary Cardiologist (physician) and Advanced Practice Providers (APPs -  Physician Assistants and Nurse Practitioners) who all work together to provide you with the care you need, when you need it.  We recommend signing up for the patient portal called "MyChart".  Sign up information is provided on this After Visit Summary.  MyChart is used to connect with patients for Virtual Visits (Telemedicine).  Patients are able to view lab/test results, encounter notes, upcoming appointments, etc.  Non-urgent messages can be sent to your provider as well.   To learn more about what you can do with MyChart, go to [email or web address].    Your next appointment:   4 months with Dr Ladona Ridgel in [place] or [place] office

## 2023-09-20 NOTE — Progress Notes (Signed)
HPI Jessica Simpson is referred for ongoing PM evaluation. She is a pleasant 52 yo woman with a h/o recurrent breast CA,s/p chemo and XRT. She developed heart block. She underwent PPM insertion. She c/o feeling more sob and weak. No syncope. She denies chest pain. She feels like her heart is beating irregularly.  Allergies  Allergen Reactions   Codeine Nausea And Vomiting   Hydrocodone-Acetaminophen Itching, Nausea And Vomiting and Other (See Comments)    Lortab     Current Outpatient Medications  Medication Sig Dispense Refill   acetaminophen (TYLENOL) 500 MG tablet Take 500 mg by mouth every 6 (six) hours as needed. For pain     amLODipine (NORVASC) 5 MG tablet Take 10 mg by mouth daily.  6   benzonatate (TESSALON) 100 MG capsule Take 1 capsule (100 mg total) by mouth 3 (three) times daily as needed. 30 capsule 2   carisoprodol (SOMA) 350 MG tablet Take 350 mg by mouth daily as needed for muscle spasms.     Cyanocobalamin (VITAMIN B-12) 5000 MCG TBDP Take 5,000 mcg by mouth daily.     ibuprofen (ADVIL) 800 MG tablet Take 1 tablet (800 mg total) by mouth every 8 (eight) hours as needed. 30 tablet 0   lidocaine-prilocaine (EMLA) cream Apply to affected area once 30 g 3   loratadine (CLARITIN) 10 MG tablet Take 10 mg by mouth daily as needed for allergies.     magnesium oxide (MAG-OX) 400 (240 Mg) MG tablet Take 1 tablet (400 mg total) by mouth daily. 30 tablet 1   metoprolol succinate (TOPROL XL) 25 MG 24 hr tablet Take 1 tablet (25 mg total) by mouth daily. 90 tablet 3   Omega-3 Fatty Acids (FISH OIL MAXIMUM STRENGTH) 1200 MG CPDR Take 1,200 mg by mouth daily.     ondansetron (ZOFRAN) 8 MG tablet Take 1 tablet (8 mg total) by mouth every 8 (eight) hours as needed for nausea or vomiting. 20 tablet 0   ondansetron (ZOFRAN) 8 MG tablet Take 1 tablet (8 mg total) by mouth every 8 (eight) hours as needed for nausea or vomiting. Take 1 tablets (8mg  total) by mouth every 8hrs as needed.  Start on the third day after chemotherapy. 30 tablet 1   oxyCODONE-acetaminophen (PERCOCET/ROXICET) 5-325 MG tablet Take 1 tablet by mouth every 8 (eight) hours as needed for severe pain. 30 tablet 0   prochlorperazine (COMPAZINE) 10 MG tablet Take 1 tablet (10 mg total) by mouth every 6 (six) hours as needed for nausea or vomiting. 30 tablet 1   prochlorperazine (COMPAZINE) 5 MG tablet Take 1 tablet (5 mg total) by mouth every 6 (six) hours as needed for nausea or vomiting. 30 tablet 0   traMADol (ULTRAM) 50 MG tablet Take 1 tablet (50 mg total) by mouth every 6 (six) hours as needed. 30 tablet 0   Vitamin D, Ergocalciferol, (DRISDOL) 1.25 MG (50000 UNIT) CAPS capsule Take 50,000 Units by mouth once a week.     No current facility-administered medications for this visit.   Facility-Administered Medications Ordered in Other Visits  Medication Dose Route Frequency Provider Last Rate Last Admin   lactated ringers infusion    Continuous PRN Andree Elk, CRNA   New Bag at 11/14/17 0700     Past Medical History:  Diagnosis Date   BRCA1 negative 10/22/2011   BRCA2 negative 10/22/2011   Breast cancer, IDC, Left, Stage III, Triple negative 06/30/2011   Breast disorder  Contraceptive education 01/15/2016   Cough    Dyspnea    Family history of breast cancer    Fracture    right 4th toe   History of breast cancer 07/22/2014   History of radiation therapy 05/16/18- 06/22/18   Right Breast/ 50.4 Gy in 28 fractions. Right posterior axilla and SCV nodes/ 50.4 Gy in 28 fractions.    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 She then received a boost to the left lumpectomy of 10 gray in 5 fractions. This was given at [facility].   Sleep apnea    Status post chemotherapy Comp. 11/23/11   FEC and Taxotere    ROS:   All systems reviewed and negative except as noted in the HPI.   Past Surgical History:  Procedure Laterality Date   anal ascess     turned into a fistula with extensive treatment   AXILLARY LYMPH NODE BIOPSY Right 04/06/2018   AXILLARY LYMPH NODE DISSECTION Right 04/06/2018   Procedure: RIGHT AXILLARY LYMPH NODE DISSECTION;  Surgeon: Emelia Loron, MD;  Location: [facility] OR;  Service: General;  Laterality: Right;   BREAST BIOPSY Left 2012   BREAST BIOPSY Right 2019   BREAST LUMPECTOMY Left 2012   BREAST LUMPECTOMY Right 2019   rt axilla   BREAST SURGERY     CARDIAC ELECTROPHYSIOLOGY MAPPING AND ABLATION     CESAREAN SECTION     COLONOSCOPY N/A 08/14/2014   Procedure: COLONOSCOPY;  Surgeon: Malissa Hippo, MD;  Location: [facility];  Service: Endoscopy;  Laterality: N/A;  930   EVACUATION BREAST HEMATOMA  12/21/2011   Procedure: EVACUATION HEMATOMA BREAST;  Surgeon: Almond Lint, MD;  Location: [facility] OR;  Service: General;  Laterality: Left;   HAND SURGERY     tumor on finger, left hand   INCISION AND DRAINAGE ABSCESS ANAL     x3   IR IMAGING GUIDED PORT INSERTION  08/01/2023   left breast biopsy with axillary biopsy     core bx   PORT-A-CATH REMOVAL  12/21/2011   Procedure: REMOVAL PORT-A-CATH;  Surgeon: Currie Paris, MD;  Location: [facility];  Service: General;  Laterality: N/A;   PORTACATH PLACEMENT  08/02/2011   dr Jamey Ripa   PORTACATH PLACEMENT Right 11/14/2017   Procedure: INSERTION PORT-A-CATH WITH [place];  Surgeon: Emelia Loron, MD;  Location: [facility] OR;  Service: General;  Laterality: Right;   removal breast mass  2004    benign   TREATMENT FISTULA ANAL     TUMOR REMOVAL     on left hand   TUMOR REMOVAL     rt. eye     Family History  Problem Relation Age of Onset   Cancer Father 44       lung cancer    Cancer Maternal Grandmother        breast   Cancer Maternal Grandfather    Breast cancer Other  MGMs mother   Colon cancer Neg Hx      Social History   Socioeconomic History   Marital status: Single    Spouse name: Not on file   Number of children: Not on file   Years of education: Not on file   Highest education level: Not on file  Occupational History   Not on file  Tobacco Use   Smoking status: Never   Smokeless tobacco: Never  Vaping Use   Vaping status: Never Used  Substance and Sexual Activity   Alcohol use: No   Drug use: No   Sexual activity: Yes    Birth control/protection: Post-menopausal  Other Topics Concern   Not on file  Social History Narrative   Not on file   Social Determinants of Health   Financial Resource Strain: Low Risk  (01/26/2022)   Overall Financial Resource Strain (CARDIA)    Difficulty of Paying Living Expenses: Not very hard  Food Insecurity: No Food Insecurity (01/26/2022)   Hunger Vital Sign    Worried About Running Out of Food in the Last Year: Never true    Ran Out of Food in the Last Year: Never true  Transportation Needs: No Transportation Needs (01/26/2022)   PRAPARE - Administrator, Civil Service (Medical): No    Lack of Transportation (Non-Medical): No  Physical Activity: Insufficiently Active (01/26/2022)   Exercise Vital Sign    Days of Exercise per Week: 3 days    Minutes of Exercise per Session: 30 min  Stress: No Stress Concern Present (01/26/2022)   [employer] of Occupational Health - Occupational Stress Questionnaire    Feeling of Stress : Only a little  Social Connections: Moderately Integrated (01/26/2022)   Social Connection and Isolation Panel [NHANES]    Frequency of Communication with Friends and  Family: More than three times a week    Frequency of Social Gatherings with Friends and Family: Once a week    Attends Religious Services: More than 4 times per year    Active Member of [employer] or Organizations: Yes    Attends Banker Meetings: More than 4 times per year    Marital Status: Never married  Intimate Partner Violence: Not At Risk (01/26/2022)   Humiliation, Afraid, Rape, and Kick questionnaire    Fear of Current or Ex-Partner: No    Emotionally Abused: No    Physically Abused: No    Sexually Abused: No     BP 118/68   Pulse 76   Ht 5\' 6"  (1.676 m)   Wt 207 lb (93.9 kg)   LMP 09/19/2018   SpO2 97%   BMI 33.41 kg/m   Physical Exam:  Well appearing 52 yo woman, NAD HEENT: Unremarkable Neck:  No JVD, no thyromegally Lymphatics:  No adenopathy Back:  No CVA tenderness Lungs:  Clear with no wheezes HEART:  Regular rate rhythm, no murmurs, no rubs, no clicks Abd:  soft, positive bowel sounds, no organomegally, no rebound, no guarding Ext:  2 plus pulses, no edema, no cyanosis, no clubbing Skin:  No rashes no nodules Neuro:  CN II through XII intact, motor grossly intact  EKG - NSR with AV pacing  DEVICE  Normal device function.  See PaceArt for details.   Assess/Plan: CHB - she has an escape in the 30's today but is asymptomatic s/p PPM insertion.  Sinus tachycardia - unclear if her rhythm was sinus or AT but she did not pace out. She will be  prescribed toprol. 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   Jessica Gowda Caoimhe Damron,MD

## 2023-09-23 MED FILL — Fosaprepitant Dimeglumine For IV Infusion 150 MG (Base Eq): INTRAVENOUS | Qty: 5 | Status: AC

## 2023-09-26 ENCOUNTER — Inpatient Hospital Stay: Payer: 59

## 2023-09-26 ENCOUNTER — Inpatient Hospital Stay: Payer: 59 | Attending: Hematology and Oncology

## 2023-09-26 ENCOUNTER — Inpatient Hospital Stay (HOSPITAL_BASED_OUTPATIENT_CLINIC_OR_DEPARTMENT_OTHER): Payer: 59 | Admitting: Hematology and Oncology

## 2023-09-26 VITALS — BP 127/74 | HR 108 | Temp 97.8°F | Resp 18 | Ht 66.0 in | Wt 206.0 lb

## 2023-09-26 VITALS — BP 130/77 | HR 102 | Temp 98.2°F | Resp 20

## 2023-09-26 DIAGNOSIS — E559 Vitamin D deficiency, unspecified: Secondary | ICD-10-CM | POA: Insufficient documentation

## 2023-09-26 DIAGNOSIS — I10 Essential (primary) hypertension: Secondary | ICD-10-CM | POA: Insufficient documentation

## 2023-09-26 DIAGNOSIS — I700 Atherosclerosis of aorta: Secondary | ICD-10-CM | POA: Diagnosis not present

## 2023-09-26 DIAGNOSIS — Z923 Personal history of irradiation: Secondary | ICD-10-CM | POA: Diagnosis not present

## 2023-09-26 DIAGNOSIS — Z95828 Presence of other vascular implants and grafts: Secondary | ICD-10-CM

## 2023-09-26 DIAGNOSIS — R509 Fever, unspecified: Secondary | ICD-10-CM | POA: Insufficient documentation

## 2023-09-26 DIAGNOSIS — Z5111 Encounter for antineoplastic chemotherapy: Secondary | ICD-10-CM | POA: Diagnosis not present

## 2023-09-26 DIAGNOSIS — C50412 Malignant neoplasm of upper-outer quadrant of left female breast: Secondary | ICD-10-CM | POA: Insufficient documentation

## 2023-09-26 DIAGNOSIS — R918 Other nonspecific abnormal finding of lung field: Secondary | ICD-10-CM | POA: Insufficient documentation

## 2023-09-26 DIAGNOSIS — Z170 Estrogen receptor positive status [ER+]: Secondary | ICD-10-CM | POA: Diagnosis not present

## 2023-09-26 DIAGNOSIS — C50919 Malignant neoplasm of unspecified site of unspecified female breast: Secondary | ICD-10-CM | POA: Diagnosis not present

## 2023-09-26 DIAGNOSIS — G4730 Sleep apnea, unspecified: Secondary | ICD-10-CM | POA: Insufficient documentation

## 2023-09-26 DIAGNOSIS — C771 Secondary and unspecified malignant neoplasm of intrathoracic lymph nodes: Secondary | ICD-10-CM | POA: Diagnosis not present

## 2023-09-26 DIAGNOSIS — N6331 Unspecified lump in axillary tail of the right breast: Secondary | ICD-10-CM | POA: Diagnosis not present

## 2023-09-26 DIAGNOSIS — R5383 Other fatigue: Secondary | ICD-10-CM | POA: Insufficient documentation

## 2023-09-26 DIAGNOSIS — R059 Cough, unspecified: Secondary | ICD-10-CM | POA: Diagnosis not present

## 2023-09-26 DIAGNOSIS — C50012 Malignant neoplasm of nipple and areola, left female breast: Secondary | ICD-10-CM | POA: Diagnosis not present

## 2023-09-26 DIAGNOSIS — Z79899 Other long term (current) drug therapy: Secondary | ICD-10-CM | POA: Diagnosis not present

## 2023-09-26 LAB — CBC WITH DIFFERENTIAL (CANCER CENTER ONLY)
Abs Immature Granulocytes: 0.03 10*3/uL (ref 0.00–0.07)
Basophils Absolute: 0 10*3/uL (ref 0.0–0.1)
Basophils Relative: 1 %
Eosinophils Absolute: 0.1 10*3/uL (ref 0.0–0.5)
Eosinophils Relative: 2 %
HCT: 29.8 % — ABNORMAL LOW (ref 36.0–46.0)
Hemoglobin: 9.9 g/dL — ABNORMAL LOW (ref 12.0–15.0)
Immature Granulocytes: 1 %
Lymphocytes Relative: 10 %
Lymphs Abs: 0.3 10*3/uL — ABNORMAL LOW (ref 0.7–4.0)
MCH: 29.2 pg (ref 26.0–34.0)
MCHC: 33.2 g/dL (ref 30.0–36.0)
MCV: 87.9 fL (ref 80.0–100.0)
Monocytes Absolute: 0.3 10*3/uL (ref 0.1–1.0)
Monocytes Relative: 10 %
Neutro Abs: 2.4 10*3/uL (ref 1.7–7.7)
Neutrophils Relative %: 76 %
Platelet Count: 281 10*3/uL (ref 150–400)
RBC: 3.39 MIL/uL — ABNORMAL LOW (ref 3.87–5.11)
RDW: 15.5 % (ref 11.5–15.5)
WBC Count: 3.2 10*3/uL — ABNORMAL LOW (ref 4.0–10.5)
nRBC: 0 % (ref 0.0–0.2)

## 2023-09-26 LAB — CMP (CANCER CENTER ONLY)
ALT: 18 U/L (ref 0–44)
AST: 14 U/L — ABNORMAL LOW (ref 15–41)
Albumin: 3.8 g/dL (ref 3.5–5.0)
Alkaline Phosphatase: 85 U/L (ref 38–126)
Anion gap: 8 (ref 5–15)
BUN: 12 mg/dL (ref 6–20)
CO2: 29 mmol/L (ref 22–32)
Calcium: 9.4 mg/dL (ref 8.9–10.3)
Chloride: 100 mmol/L (ref 98–111)
Creatinine: 0.87 mg/dL (ref 0.44–1.00)
GFR, Estimated: >60 mL/min (ref >60)
Glucose, Bld: 103 mg/dL — ABNORMAL HIGH (ref 70–99)
Potassium: 3.3 mmol/L — ABNORMAL LOW (ref 3.5–5.1)
Sodium: 137 mmol/L (ref 135–145)
Total Bilirubin: 0.4 mg/dL (ref <1.2)
Total Protein: 7.3 g/dL (ref 6.5–8.1)

## 2023-09-26 LAB — PHOSPHORUS: Phosphorus: 3.8 mg/dL (ref 2.5–4.6)

## 2023-09-26 LAB — MAGNESIUM: Magnesium: 1.8 mg/dL (ref 1.7–2.4)

## 2023-09-26 MED ORDER — HEPARIN SOD (PORK) LOCK FLUSH 100 UNIT/ML IV SOLN
500.0000 [IU] | Freq: Once | INTRAVENOUS | Status: AC | PRN
  Administered 2023-09-26: 500 [IU]

## 2023-09-26 MED ORDER — DIPHENHYDRAMINE HCL 50 MG/ML IJ SOLN
50.0000 mg | Freq: Once | INTRAMUSCULAR | Status: AC
Start: 2023-09-26 — End: 2023-09-26
  Administered 2023-09-26: 50 mg via INTRAVENOUS
  Filled 2023-09-26: qty 1

## 2023-09-26 MED ORDER — PROCHLORPERAZINE MALEATE 10 MG PO TABS: 10.0000 mg | ORAL_TABLET | Freq: Four times a day (QID) | ORAL | 6 refills | Status: DC | PRN

## 2023-09-26 MED ORDER — SODIUM CHLORIDE 0.9 % IV SOLN: Freq: Once | INTRAVENOUS | Status: AC

## 2023-09-26 MED ORDER — ATROPINE SULFATE 1 MG/ML IV SOLN
0.5000 mg | Freq: Once | INTRAVENOUS | Status: DC | PRN
  Filled 2023-09-26: qty 1

## 2023-09-26 MED ORDER — PALONOSETRON HCL INJECTION 0.25 MG/5ML
0.2500 mg | Freq: Once | INTRAVENOUS | Status: AC
  Administered 2023-09-26: 0.25 mg via INTRAVENOUS
  Filled 2023-09-26: qty 5

## 2023-09-26 MED ORDER — FAMOTIDINE IN NACL 20-0.9 MG/50ML-% IV SOLN
20.0000 mg | Freq: Once | INTRAVENOUS | Status: AC
  Administered 2023-09-26: 20 mg via INTRAVENOUS
  Filled 2023-09-26: qty 50

## 2023-09-26 MED ORDER — ACETAMINOPHEN 325 MG PO TABS
650.0000 mg | ORAL_TABLET | Freq: Once | ORAL | Status: AC
  Administered 2023-09-26: 650 mg via ORAL
  Filled 2023-09-26: qty 2

## 2023-09-26 MED ORDER — DEXAMETHASONE SODIUM PHOSPHATE 10 MG/ML IJ SOLN
10.0000 mg | Freq: Once | INTRAMUSCULAR | Status: AC
  Administered 2023-09-26: 10 mg via INTRAVENOUS
  Filled 2023-09-26: qty 1

## 2023-09-26 MED ORDER — SODIUM CHLORIDE 0.9 % IV SOLN
900.0000 mg | Freq: Once | INTRAVENOUS | Status: AC
  Administered 2023-09-26: 900 mg via INTRAVENOUS
  Filled 2023-09-26: qty 90

## 2023-09-26 MED ORDER — SODIUM CHLORIDE 0.9 % IV SOLN
150.0000 mg | Freq: Once | INTRAVENOUS | Status: AC
  Administered 2023-09-26: 150 mg via INTRAVENOUS
  Filled 2023-09-26: qty 150

## 2023-09-26 MED ORDER — SODIUM CHLORIDE 0.9% FLUSH
10.0000 mL | INTRAVENOUS | Status: DC | PRN
  Administered 2023-09-26: 10 mL

## 2023-09-26 MED ORDER — SODIUM CHLORIDE 0.9% FLUSH
10.0000 mL | Freq: Once | INTRAVENOUS | Status: AC
  Administered 2023-09-26: 10 mL

## 2023-09-26 NOTE — Progress Notes (Signed)
Patient Care Team: Arlina Robes, MD as PCP - General (Family Medicine) Serena Croissant, MD as Consulting Physician (Hematology and Oncology)  DIAGNOSIS:  Encounter Diagnoses  Name Primary?   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, unspecified laterality (HCC) Yes   Mass of axillary tail of right breast    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, unspecified estrogen receptor status (HCC)     SUMMARY OF ONCOLOGIC HISTORY: 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)  06/30/2011 Initial Diagnosis   Breast cancer, IDC, Left, Stage III, Triple negative, 7.6 cm breast mass and palpable axillary mass   09/06/2011 Miscellaneous   BRCA 1 and 2: Negative   09/14/2011 - 11/23/2011 Neo-Adjuvant Chemotherapy   Dose dense FEC followed by dose dense Taxotere   12/21/2011 Surgery   Left lumpectomy: High-grade poorly differentiated IDC 1.7 cm with high-grade DCIS, margins negative, 3/7 lymph nodes positive, ER 0%, PR 0%, HER-2 negative ratio 1.33 T1CN1 stage IIb   12/31/2011 - 02/15/2012 Radiation Therapy   Radiation at [facility]   09/26/2017 Relapse/Recurrence   Left breast upper outer quadrant within the lumpectomy bed: Fibrosis no malignancy, right axillary lymph node biopsy metastatic high-grade carcinoma ER 0%, PR 0%, Ki-67 90%, HER-2 negative ratio 1.11 (similar to previous ductal carcinoma)   11/14/2017 - 03/06/2018 Neo-Adjuvant Chemotherapy   Neo-Adjuvant chemotherapy with Gemzar and carboplatin days 1 and 8 every 3 weeks   01/10/2018 Genetic Testing   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  The Hereditary Gene Panel offered by [facility] includes sequencing and/or deletion duplication testing of the following 47 genes: APC, ATM, AXIN2, BARD1, BMPR1A, BRCA1, BRCA2, BRIP1, CDH1, CDK4, CDKN2A (p14ARF), CDKN2A (p16INK4a), CHEK2, CTNNA1, DICER1, EPCAM (Deletion/duplication testing only),  GREM1 (promoter region deletion/duplication testing only), KIT, MEN1, MLH1, MSH2, MSH3, MSH6, MUTYH, NBN, NF1, NHTL1, PALB2, PDGFRA, PMS2, POLD1, POLE, PTEN, RAD50, RAD51C, RAD51D, SDHB, SDHC, SDHD, SMAD4, SMARCA4. STK11, TP53, TSC1, TSC2, and VHL.  The following genes were evaluated for sequence changes only: SDHA and HOXB13 c.251G>A variant only. The report date is January 10, 2018.    05/31/2023 Procedure   Mediastinal mass biopsy: Metastatic poorly differentiated carcinoma compatible with breast primary ER +60%, PR 0%, HER2 0, Ki-67 40%    06/02/2023 PET scan   PET/CT Anterior mediastinal mass is hypermetabolic and centrally necrotic, soft tissue lesions anterior to the heart, 10 mm pleural-based lesion, level 2 cervical nodes, hypermetabolism hepatic dome mottled FDG accumulation throughout the skeletal system    06/06/2023 - 07/14/2023 Anti-estrogen oral therapy   Antiestrogen therapy with Verzenio and letrozole    07/07/2023 Relapse/Recurrence   Increasing mediastinal mass 11.6 cm (used to be 10.8 cm) effacing the heart and involving the sternum, additional mediastinal/pericardial lymph nodes increased right extra lymph node 1.5 cm, nodules 0.2 cm (used to be 0.8 cm), few new scattered lung nodules)   07/25/2023 -  Chemotherapy   Patient is on Treatment Plan : BREAST METASTATIC Sacituzumab govitecan-hziy [name]) D1,8 q21d     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)  04/06/2018 Surgery   Right axillary lymph node dissection: 1/11 node positive for metastatic high-grade carcinoma   04/25/2018 Initial Diagnosis   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)   04/25/2018 Cancer Staging   Staging form: Breast, AJCC 8th Edition - Clinical: Stage IIB (cT0, cN1, cM0, G3, ER-, PR-, HER2-) - Signed by Lonie Peak, MD on 04/25/2018  05/16/2018 - 06/22/2018 Radiation Therapy   Adjuvant radiation therapy with Xeloda   07/11/2018 - 10/24/2018 Chemotherapy   Adjuvant chemotherapy with  CMF     CHIEF COMPLIANT: Cycle 4 Trodelvy, follow-up after scans  HISTORY OF PRESENT ILLNESS:   History of Present Illness   The patient, with a history of cancer, presents for a follow-up visit after recent scans. She reports that she has been experiencing fatigue, which she describes as not normal. She saw a cardiologist who adjusted her pacemaker, which has resulted in some improvement. She also reports intermittent nausea, which is managed with medication as needed. She has a cough, which she describes as spells of multiple coughs, and she has noticed some drainage. She has been prescribed Tessalon capsules for the cough, but she only took it once due to dizziness. She also reports changes in taste and appetite, with some days being better than others.         ALLERGIES:  is allergic to codeine and hydrocodone-acetaminophen.  MEDICATIONS:  Current Outpatient Medications  Medication Sig Dispense Refill   acetaminophen (TYLENOL) 500 MG tablet Take 500 mg by mouth every 6 (six) hours as needed. For pain     amLODipine (NORVASC) 5 MG tablet Take 10 mg by mouth daily.  6   carisoprodol (SOMA) 350 MG tablet Take 350 mg by mouth daily as needed for muscle spasms.     Cyanocobalamin (VITAMIN B-12) 5000 MCG TBDP Take 5,000 mcg by mouth daily.     ibuprofen (ADVIL) 800 MG tablet Take 1 tablet (800 mg total) by mouth every 8 (eight) hours as needed. 30 tablet 0   lidocaine-prilocaine (EMLA) cream Apply to affected area once 30 g 3   loratadine (CLARITIN) 10 MG tablet Take 10 mg by mouth daily as needed for allergies.     magnesium oxide (MAG-OX) 400 (240 Mg) MG tablet Take 1 tablet (400 mg total) by mouth daily. 30 tablet 1   metoprolol succinate (TOPROL XL) 25 MG 24 hr tablet Take 1 tablet (25 mg total) by mouth daily. 90 tablet 3   Omega-3 Fatty Acids (FISH OIL MAXIMUM STRENGTH) 1200 MG CPDR Take 1,200 mg by mouth daily.     ondansetron (ZOFRAN) 8 MG tablet Take 1 tablet (8 mg total) by mouth  every 8 (eight) hours as needed for nausea or vomiting. 20 tablet 0   ondansetron (ZOFRAN) 8 MG tablet Take 1 tablet (8 mg total) by mouth every 8 (eight) hours as needed for nausea or vomiting. Take 1 tablets (8mg  total) by mouth every 8hrs as needed. Start on the third day after chemotherapy. 30 tablet 1   oxyCODONE-acetaminophen (PERCOCET/ROXICET) 5-325 MG tablet Take 1 tablet by mouth every 8 (eight) hours as needed for severe pain. 30 tablet 0   prochlorperazine (COMPAZINE) 10 MG tablet Take 1 tablet (10 mg total) by mouth every 6 (six) hours as needed for nausea or vomiting. 30 tablet 6   traMADol (ULTRAM) 50 MG tablet Take 1 tablet (50 mg total) by mouth every 6 (six) hours as needed. 30 tablet 0   Vitamin D, Ergocalciferol, (DRISDOL) 1.25 MG (50000 UNIT) CAPS capsule Take 50,000 Units by mouth once a week.     No current facility-administered medications for this visit.   Facility-Administered Medications Ordered in Other Visits  Medication Dose Route Frequency Provider Last Rate Last Admin   lactated ringers infusion    Continuous PRN Andree Elk, CRNA   New Bag at 11/14/17 0700  PHYSICAL EXAMINATION: ECOG PERFORMANCE STATUS: 1 - Symptomatic but completely ambulatory  Vitals:   09/26/23 0934  BP: 127/74  Pulse: (!) 108  Resp: 18  Temp: 97.8 F (36.6 C)  SpO2: 98%   Filed Weights   09/26/23 0934  Weight: 206 lb (93.4 kg)      LABORATORY DATA:  I have reviewed the data as listed    Latest Ref Rng & Units 09/26/2023    9:10 AM 09/12/2023    8:25 AM 09/05/2023    7:37 AM  CMP  Glucose 70 - 99 mg/dL 371  062  694   BUN 6 - 20 mg/dL 12  17  14    Creatinine 0.44 - 1.00 mg/dL 8.54  6.27  0.35   Sodium 135 - 145 mmol/L 137  137  138   Potassium 3.5 - 5.1 mmol/L 3.3  3.4  3.6   Chloride 98 - 111 mmol/L 100  102  101   CO2 22 - 32 mmol/L 29  27  30    Calcium 8.9 - 10.3 mg/dL 9.4  9.4  9.4   Total Protein 6.5 - 8.1 g/dL 7.3  7.3  7.3   Total Bilirubin <1.2 mg/dL  0.4  0.3  0.3   Alkaline Phos 38 - 126 U/L 85  75  86   AST 15 - 41 U/L 14  12  13    ALT 0 - 44 U/L 18  12  12      Lab Results  Component Value Date   WBC 3.2 (L) 09/26/2023   HGB 9.9 (L) 09/26/2023   HCT 29.8 (L) 09/26/2023   MCV 87.9 09/26/2023   PLT 281 09/26/2023   NEUTROABS 2.4 09/26/2023    ASSESSMENT & PLAN:  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) Prior treatment: Verzinio with letrozole started 06/08/2023-07/20/2023   CT CAP: Increasing mediastinal mass 11.6 cm (used to be 10.8 cm) effacing the heart and involving the sternum, additional mediastinal/pericardial lymph nodes increased right extra lymph node 1.5 cm, nodules 0.2 cm (used to be 0.8 cm), few new scattered lung nodules)    Treatment plan: [name] started 07/25/2023, today is cycle 4 Palliative radiation to the chest may start 08/15/2023   Chest discomfort: Secondary to malignancy.   Shortness of breath: Again related to the mass.   Fatigue: Combination of being on chemo as well as pacemaker issues.   [name] toxicities: Severe fatigue Monitoring blood levels Hypomagnesemia: Oral magnesium supplementation  CT CAP 09/20/2023: Decrease in the size of the anterior mediastinal mass that effaces the heart and invades the sternum/chest wall, decreased size of the pleural-based left lower lobe lung nodule.  Scattered pulmonary nodules not visualized.  Decreased hepatic metastases.  Stable T1   Based on above CT scans patient is responding to treatment and therefore we will continue with the same. We gradually increased the dosage of Trodelvy to 10 mg/kg   Chronic Cough Multiple episodes of coughing, possibly related to post-nasal drip. -Try Mucinex and daily Claritin to manage symptoms.  Return to clinic day 1 day 8 for chemo and I will see her every 3 weeks. Our plan is to do 6 rounds of chemo and then obtain a PET CT scan.         Orders Placed This Encounter  Procedures   CBC with  Differential (Cancer Center Only)    Standing Status:   Future    Standing Expiration Date:   10/16/2024   CMP (Cancer Center only)  Standing Status:   Future    Standing Expiration Date:   10/16/2024   Magnesium    Standing Status:   Future    Standing Expiration Date:   10/16/2024   Phosphorus    Standing Status:   Future    Standing Expiration Date:   10/16/2024   CBC with Differential (Cancer Center Only)    Standing Status:   Future    Standing Expiration Date:   10/23/2024   CMP (Cancer Center only)    Standing Status:   Future    Standing Expiration Date:   10/23/2024   Magnesium    Standing Status:   Future    Standing Expiration Date:   10/23/2024   Phosphorus    Standing Status:   Future    Standing Expiration Date:   10/23/2024   CBC with Differential (Cancer Center Only)    Standing Status:   Future    Standing Expiration Date:   11/06/2024   CMP (Cancer Center only)    Standing Status:   Future    Standing Expiration Date:   11/06/2024   Magnesium    Standing Status:   Future    Standing Expiration Date:   11/06/2024   Phosphorus    Standing Status:   Future    Standing Expiration Date:   11/06/2024   CBC with Differential (Cancer Center Only)    Standing Status:   Future    Standing Expiration Date:   11/13/2024   CMP (Cancer Center only)    Standing Status:   Future    Standing Expiration Date:   11/13/2024   Magnesium    Standing Status:   Future    Standing Expiration Date:   11/13/2024   Phosphorus    Standing Status:   Future    Standing Expiration Date:   11/13/2024   CBC with Differential (Cancer Center Only)    Standing Status:   Future    Standing Expiration Date:   11/27/2024   CMP (Cancer Center only)    Standing Status:   Future    Standing Expiration Date:   11/27/2024   Magnesium    Standing Status:   Future    Standing Expiration Date:   11/27/2024   Phosphorus    Standing Status:   Future    Standing Expiration Date:   11/27/2024   CBC with  Differential (Cancer Center Only)    Standing Status:   Future    Standing Expiration Date:   12/04/2024   CMP (Cancer Center only)    Standing Status:   Future    Standing Expiration Date:   12/04/2024   Magnesium    Standing Status:   Future    Standing Expiration Date:   12/04/2024   Phosphorus    Standing Status:   Future    Standing Expiration Date:   12/04/2024   CBC with Differential (Cancer Center Only)    Standing Status:   Future    Standing Expiration Date:   12/18/2024   CMP (Cancer Center only)    Standing Status:   Future    Standing Expiration Date:   12/18/2024   Magnesium    Standing Status:   Future    Standing Expiration Date:   12/18/2024   Phosphorus    Standing Status:   Future    Standing Expiration Date:   12/18/2024   CBC with Differential (Cancer Center Only)    Standing Status:   Future  Standing Expiration Date:   12/25/2024   CMP (Cancer Center only)    Standing Status:   Future    Standing Expiration Date:   12/25/2024   Magnesium    Standing Status:   Future    Standing Expiration Date:   12/25/2024   Phosphorus    Standing Status:   Future    Standing Expiration Date:   12/25/2024   CBC with Differential (Cancer Center Only)    Standing Status:   Future    Standing Expiration Date:   01/08/2025   CMP (Cancer Center only)    Standing Status:   Future    Standing Expiration Date:   01/08/2025   Magnesium    Standing Status:   Future    Standing Expiration Date:   01/08/2025   Phosphorus    Standing Status:   Future    Standing Expiration Date:   01/08/2025   CBC with Differential (Cancer Center Only)    Standing Status:   Future    Standing Expiration Date:   01/15/2025   CMP (Cancer Center only)    Standing Status:   Future    Standing Expiration Date:   01/15/2025   Magnesium    Standing Status:   Future    Standing Expiration Date:   01/15/2025   Phosphorus    Standing Status:   Future    Standing Expiration Date:   01/15/2025   The patient has  a good understanding of the overall plan. she agrees with it. she will call with any problems that may develop before the next visit here. Total time spent: 30 mins including face to face time and time spent for planning, charting and co-ordination of care   Tamsen Meek, MD 09/26/23

## 2023-09-26 NOTE — Assessment & Plan Note (Signed)
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   Shortness of breath: Again related to the mass.  Chest x-ray done today did not reveal any abnormality.   [name] toxicities: Severe fatigue Monitoring blood levels Hypomagnesemia: Oral magnesium supplementation  CT CAP 09/20/2023: Decrease in the size of the anterior mediastinal mass that effaces the heart and invades the sternum/chest wall, decreased size of the pleural-based left lower lobe lung nodule.  Scattered pulmonary nodules not visualized.  Decreased hepatic metastases.  Stable T1   Based on above CT scans patient is responding to treatment and therefore we will continue with the same. We gradually increased the dosage of Trodelvy to 10 mg/kg

## 2023-09-26 NOTE — Patient Instructions (Addendum)
[facility] - A DEPT OF [facility]  Discharge Instructions: Thank you for choosing [facility] to provide your oncology and hematology care.   If you have a lab appointment with the Cancer Center, please go directly to the Cancer Center and check in at the registration area.   Wear comfortable clothing and clothing appropriate for easy access to any Portacath or PICC line.   We strive to give you quality time with your provider. You may need to reschedule your appointment if you arrive late (15 or more minutes).  Arriving late affects you and other patients whose appointments are after yours.  Also, if you miss three or more appointments without notifying the office, you may be dismissed from the clinic at the provider's discretion.      For prescription refill requests, have your pharmacy contact our office and allow 72 hours for refills to be completed.    Today you received the following chemotherapy and/or immunotherapy agent: Sacituzumab-govetican [name])   To help prevent nausea and vomiting after your treatment, we encourage you to take your nausea medication as directed.  BELOW ARE SYMPTOMS THAT SHOULD BE REPORTED IMMEDIATELY: *FEVER GREATER THAN 100.4 F (38 C) OR HIGHER *CHILLS OR SWEATING *NAUSEA AND VOMITING THAT IS NOT CONTROLLED WITH YOUR NAUSEA MEDICATION *UNUSUAL SHORTNESS OF BREATH *UNUSUAL BRUISING OR BLEEDING *URINARY PROBLEMS (pain or burning when urinating, or frequent urination) *BOWEL PROBLEMS (unusual diarrhea, constipation, pain near the anus) TENDERNESS IN MOUTH AND THROAT WITH OR WITHOUT PRESENCE OF ULCERS (sore throat, sores in mouth, or a toothache) UNUSUAL RASH, SWELLING OR PAIN  UNUSUAL VAGINAL DISCHARGE OR ITCHING   Items with * indicate a potential emergency and should be followed up as soon as possible or go to the Emergency Department if any problems should occur.  Please show the CHEMOTHERAPY ALERT  CARD or IMMUNOTHERAPY ALERT CARD at check-in to the Emergency Department and triage nurse.  Should you have questions after your visit or need to cancel or reschedule your appointment, please contact [facility] - A DEPT OF [facility]  Dept: [phone]  and follow the prompts.  Office hours are 8:00 a.m. to 4:30 p.m. Monday - Friday. Please note that voicemails left after 4:00 p.m. may not be returned until the following business day.  We are closed weekends and major holidays. You have access to a nurse at all times for urgent questions. Please call the main number to the clinic Dept: [phone] and follow the prompts.   For any non-urgent questions, you may also contact your provider using MyChart. We now offer e-Visits for anyone 67 and older to request care online for non-urgent symptoms. For details visit [email or web address].   Also download the MyChart app! Go to the app store, search "MyChart", open the app, select [facility], and log in with your MyChart username and password.  Sacituzumab Govitecan Injection What is this medication? SACITUZUMAB GOVITECAN (SAK i TOOZ ue mab GOE vi TEE kan) treats breast cancer. It may also be used to treat bladder cancer and kidney cancer. It works by blocking a protein that causes cancer cells to grow and multiply. This helps to slow or stop the spread of cancer cells. This medicine may be used for other purposes; ask your health care provider or pharmacist if you have questions. COMMON BRAND NAME(S): TRODELVY What should I tell my care team before I take this medication? They need to know if  you have any of these conditions: Carry the UGT1A1*28 gene Infection Liver disease An unusual or allergic reaction to sacituzumab govitecan, other medications, foods, dyes, or preservatives Pregnant or trying to get pregnant Breast-feeding How should I use this medication? This medication is injected into a vein. It is  given by your care team in a hospital or clinic setting. Talk to your care team about the use of this medication in children. Special care may be needed. Overdosage: If you think you have taken too much of this medicine contact a poison control center or emergency room at once. NOTE: This medicine is only for you. Do not share this medicine with others. What if I miss a dose? Keep appointments for follow-up doses. It is important not to miss your dose. Call your care team if you are unable to keep an appointment. What may interact with this medication? This medication may affect how other medications work, and other medications may affect the way this medication works. Talk with your care team about all of the medications you take. They may suggest changes to your treatment plan to lower the risk of side effects and to make sure your medications work as intended. This list may not describe all possible interactions. Give your health care provider a list of all the medicines, herbs, non-prescription drugs, or dietary supplements you use. Also tell them if you smoke, drink alcohol, or use illegal drugs. Some items may interact with your medicine. What should I watch for while using this medication? This medication may make you feel generally unwell. This is not uncommon as chemotherapy can affect healthy cells as well as cancer cells. Report any side effects. Continue your course of treatment even though you feel ill unless your care team tells you to stop. You may need blood work while you are taking this medication. Certain genetic factors may decrease the safety of this medication. Your care team may use genetic tests to determine treatment. This medication can cause serious allergic reactions. To reduce your risk, your care team may give you other medications to take before receiving this one. Be sure to follow the directions from your care team. Check with your care team if you have severe diarrhea,  nausea, and vomiting, or if you sweat a lot. The loss of too much body fluid may make it dangerous for you to take this medication. Talk to your care team if you wish to become pregnant or think you might be pregnant. This medication can cause serious birth defects if taken during pregnancy or if you get pregnant within 6 months after stopping treatment. A negative pregnancy test is required before starting this medication. A reliable form of contraception is recommended while taking this medication and for 6 months after stopping treatment. Talk to your care team about reliable forms of contraception. Use a condom during sex and for 3 months after stopping treatment. Tell your care team right away if you think your partner might be pregnant. This medication can cause serious birth defects. Do not breast-feed while taking this medication and for 1 month after stopping therapy. This medication may cause infertility. Talk to your care team if you are concerned about your fertility. This medication may increase your risk of getting an infection. Call your care team for advice if you get a fever, chills, sore throat, or other symptoms of a cold or flu. Do not treat yourself. Try to avoid being around people who are sick. Avoid taking medications that contain  aspirin, acetaminophen, ibuprofen, naproxen, or ketoprofen unless instructed by your care team. These medications may hide a fever. This medication may increase blood sugar. The risk may be higher in patients who already have diabetes. Ask your care team what you can do to lower your risk of diabetes while taking this medication. What side effects may I notice from receiving this medication? Side effects that you should report to your care team as soon as possible: Allergic reactions--skin rash, itching, hives, swelling of the face, lips, tongue, or throat Infection--fever, chills, cough, or sore throat Infusion reactions--chest pain, shortness of breath  or trouble breathing, feeling faint or lightheaded Low red blood cell level--unusual weakness or fatigue, dizziness, headache, trouble breathing Severe or prolonged diarrhea Side effects that usually do not require medical attention (report these to your care team if they continue or are bothersome): Constipation Diarrhea Fatigue Hair loss Loss of appetite Nausea Vomiting This list may not describe all possible side effects. Call your doctor for medical advice about side effects. You may report side effects to FDA at 1-800-FDA-1088. Where should I keep my medication? This medication is given in a hospital or clinic. It will not be stored at home. NOTE: This sheet is a summary. It may not cover all possible information. If you have questions about this medicine, talk to your doctor, pharmacist, or health care provider.  2024 Elsevier/Gold Standard (2022-02-23 00:00:00)  Hypokalemia Hypokalemia means that the amount of potassium in the blood is lower than normal. Potassium is a mineral (electrolyte) that helps regulate the amount of fluid in the body. It also stimulates muscle tightening (contraction) and helps nerves work properly. Normally, most of the body's potassium is inside cells, and only a very small amount is in the blood. Because the amount in the blood is so small, minor changes to potassium levels in the blood can be life-threatening. What are the causes? This condition may be caused by: Antibiotic medicine. Diarrhea or vomiting. Taking too much of a medicine that helps you have a bowel movement (laxative) can cause diarrhea and lead to hypokalemia. Chronic kidney disease (CKD). Medicines that help the body get rid of excess fluid (diuretics). Eating disorders, such as anorexia or bulimia. Low magnesium levels in the body. Sweating a lot. What are the signs or symptoms? Symptoms of this condition include: Weakness. Constipation. Fatigue. Muscle cramps. Mental  confusion. Skipped heartbeats or irregular heartbeat (palpitations). Tingling or numbness. How is this diagnosed? This condition is diagnosed with a blood test. How is this treated? This condition may be treated by: Taking potassium supplements. Adjusting the medicines that you take. Eating more foods that contain a lot of potassium. If your potassium level is very low, you may need to get potassium through an IV and be monitored in the hospital. Follow these instructions at home: Eating and drinking  Eat a healthy diet. A healthy diet includes fresh fruits and vegetables, whole grains, healthy fats, and lean proteins. If told, eat more foods that contain a lot of potassium. These include: Nuts, such as peanuts and pistachios. Seeds, such as sunflower seeds and pumpkin seeds. Peas, lentils, and lima beans. Whole grain and bran cereals and breads. Fresh fruits and vegetables, such as apricots, avocado, bananas, cantaloupe, kiwi, oranges, tomatoes, asparagus, and potatoes. Juices, such as orange, tomato, and prune. Lean meats, including fish. Milk and milk products, such as yogurt. General instructions Take over-the-counter and prescription medicines only as told by your health care provider. This includes vitamins, natural food  products, and supplements. Keep all follow-up visits. This is important. Contact a health care provider if: You have weakness that gets worse. You feel your heart pounding or racing. You vomit. You have diarrhea. You have diabetes and you have trouble keeping your blood sugar in your target range. Get help right away if: You have chest pain. You have shortness of breath. You have vomiting or diarrhea that lasts for more than 2 days. You faint. These symptoms may be an emergency. Get help right away. Call 911. Do not wait to see if the symptoms will go away. Do not drive yourself to the hospital. Summary Hypokalemia means that the amount of potassium in  the blood is lower than normal. This condition is diagnosed with a blood test. Hypokalemia may be treated by taking potassium supplements, adjusting the medicines that you take, or eating more foods that are high in potassium. If your potassium level is very low, you may need to get potassium through an IV and be monitored in the hospital. This information is not intended to replace advice given to you by your health care provider. Make sure you discuss any questions you have with your health care provider. Document Revised: 06/25/2021 Document Reviewed: 06/25/2021 Elsevier Patient Education  2024 [employer].

## 2023-09-26 NOTE — Progress Notes (Signed)
Pt. states she has been tolerating treatment well and declines to stay for 30 minute post observation. Vital signs stable, left via ambulation, and no shortness of breath noted.

## 2023-09-28 ENCOUNTER — Ambulatory Visit: Payer: Self-pay | Admitting: Radiation Oncology

## 2023-09-29 ENCOUNTER — Telehealth: Payer: Self-pay

## 2023-09-29 ENCOUNTER — Ambulatory Visit
Admission: RE | Admit: 2023-09-29 | Discharge: 2023-09-29 | Disposition: A | Discharge status: Home / Self Care | Payer: 59 | Source: Ambulatory Visit | Attending: Radiation Oncology | Admitting: Radiation Oncology

## 2023-09-29 ENCOUNTER — Encounter: Payer: Self-pay | Admitting: Radiation Oncology

## 2023-09-29 NOTE — Progress Notes (Signed)
Patient identity verified x2  Patient is here today for follow up post radiation to the chest (intrathoracic).  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  Ruel Favors, LPN

## 2023-09-29 NOTE — Telephone Encounter (Signed)
[facility] CSW Progress Note  Clinical Child psychotherapist  contacted patient by phone  for counseling. Patient reported not feeling well. Appointment rescheduled   Marguerita Merles, LCSWA Clinical Social Worker [facility]

## 2023-09-30 MED FILL — Fosaprepitant Dimeglumine For IV Infusion 150 MG (Base Eq): INTRAVENOUS | Qty: 5 | Status: AC

## 2023-09-30 NOTE — Progress Notes (Unsigned)
Palliative Medicine [name]  Telephone:([phone]) [phone] Fax:(336) [phone]   Name: Jessica Simpson Date: 09/30/2023 MRN: [id number]  DOB: Mar 14, 1971  Patient Care Team: Arlina Robes, MD as PCP - General (Family Medicine) Serena Croissant, MD as Consulting Physician (Hematology and Oncology)    REASON FOR CONSULTATION: Jessica Simpson is a 52 y.o. 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  Palliative ask to see for symptom management and goals of care.    SOCIAL HISTORY:     reports that she has never smoked. She has never used smokeless tobacco. She reports that she does not drink alcohol and does not use drugs.  ADVANCE DIRECTIVES:  None on file  CODE STATUS: Full code  PAST MEDICAL HISTORY: Past Medical History:  Diagnosis Date   BRCA1 negative 10/22/2011   BRCA2 negative 10/22/2011   Breast cancer, IDC, Left, Stage III, Triple negative 06/30/2011   Breast disorder    Contraceptive education 01/15/2016   Cough    Dyspnea    Family history of breast cancer    Fracture    right 4th toe   History of breast cancer 07/22/2014   History of radiation therapy 05/16/18- 06/22/18   Right Breast/ 50.4 Gy in 28 fractions. Right posterior axilla and SCV nodes/ 50.4 Gy in 28 fractions.    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 She then received a boost to the left lumpectomy of 10  gray in 5 fractions. This was given at [facility].   Sleep apnea    Status post chemotherapy Comp. 11/23/11   FEC and Taxotere    PAST SURGICAL HISTORY:  Past Surgical History:  Procedure Laterality Date   anal ascess     turned into a fistula with extensive treatment   AXILLARY LYMPH NODE BIOPSY Right 04/06/2018   AXILLARY LYMPH NODE DISSECTION Right 04/06/2018   Procedure: RIGHT AXILLARY LYMPH NODE DISSECTION;  Surgeon: Emelia Loron, MD;  Location: [facility] OR;  Service: General;  Laterality: Right;   BREAST BIOPSY Left 2012   BREAST BIOPSY Right 2019   BREAST LUMPECTOMY Left 2012   BREAST LUMPECTOMY Right 2019   rt axilla   BREAST SURGERY     CARDIAC ELECTROPHYSIOLOGY MAPPING AND ABLATION     CESAREAN SECTION     COLONOSCOPY N/A 08/14/2014   Procedure: COLONOSCOPY;  Surgeon: Malissa Hippo, MD;  Location: [facility];  Service: Endoscopy;  Laterality: N/A;  930   EVACUATION BREAST HEMATOMA  12/21/2011   Procedure: EVACUATION HEMATOMA BREAST;  Surgeon: Almond Lint, MD;  Location: [facility] OR;  Service: General;  Laterality: Left;   HAND SURGERY     tumor on finger, left hand   INCISION AND DRAINAGE ABSCESS ANAL     x3   IR IMAGING GUIDED PORT INSERTION  08/01/2023   left breast biopsy with axillary biopsy     core bx  PORT-A-CATH REMOVAL  12/21/2011   Procedure: REMOVAL PORT-A-CATH;  Surgeon: Currie Paris, MD;  Location: [facility];  Service: General;  Laterality: N/A;   PORTACATH PLACEMENT  08/02/2011   dr Jamey Ripa   PORTACATH PLACEMENT Right 11/14/2017   Procedure: INSERTION PORT-A-CATH WITH [place];  Surgeon: Emelia Loron, MD;  Location: [facility] OR;  Service: General;  Laterality: Right;   removal breast mass  2004   benign   TREATMENT FISTULA ANAL     TUMOR REMOVAL     on left hand   TUMOR REMOVAL     rt. eye    HEMATOLOGY/ONCOLOGY HISTORY:  Oncology History  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  (HCC)  06/30/2011 Initial Diagnosis   Breast cancer, IDC, Left, Stage III, Triple negative, 7.6 cm breast mass and palpable axillary mass   09/06/2011 Miscellaneous   BRCA 1 and 2: Negative   09/14/2011 - 11/23/2011 Neo-Adjuvant Chemotherapy   Dose dense FEC followed by dose dense Taxotere   12/21/2011 Surgery   Left lumpectomy: High-grade poorly differentiated IDC 1.7 cm with high-grade DCIS, margins negative, 3/7 lymph nodes positive, ER 0%, PR 0%, HER-2 negative ratio 1.33 T1CN1 stage IIb   12/31/2011 - 02/15/2012 Radiation Therapy   Radiation at [facility]   09/26/2017 Relapse/Recurrence   Left breast upper outer quadrant within the lumpectomy bed: Fibrosis no malignancy, right axillary lymph node biopsy metastatic high-grade carcinoma ER 0%, PR 0%, Ki-67 90%, HER-2 negative ratio 1.11 (similar to previous ductal carcinoma)   11/14/2017 - 03/06/2018 Neo-Adjuvant Chemotherapy   Neo-Adjuvant chemotherapy with Gemzar and carboplatin days 1 and 8 every 3 weeks   01/10/2018 Genetic Testing   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  The Hereditary Gene Panel offered by [facility] includes sequencing and/or deletion duplication testing of the following 47 genes: APC, ATM, AXIN2, BARD1, BMPR1A, BRCA1, BRCA2, BRIP1, CDH1, CDK4, CDKN2A (p14ARF), CDKN2A (p16INK4a), CHEK2, CTNNA1, DICER1, EPCAM (Deletion/duplication testing only), GREM1 (promoter region deletion/duplication testing only), KIT, MEN1, MLH1, MSH2, MSH3, MSH6, MUTYH, NBN, NF1, NHTL1, PALB2, PDGFRA, PMS2, POLD1, POLE, PTEN, RAD50, RAD51C, RAD51D, SDHB, SDHC, SDHD, SMAD4, SMARCA4. STK11, TP53, TSC1, TSC2, and VHL.  The following genes were evaluated for sequence changes only: SDHA and HOXB13 c.251G>A variant only. The report date is January 10, 2018.    05/31/2023 Procedure   Mediastinal mass biopsy: Metastatic poorly differentiated carcinoma compatible with breast primary ER +60%, PR 0%, HER2 0, Ki-67 40%     06/02/2023 PET scan   PET/CT Anterior mediastinal mass is hypermetabolic and centrally necrotic, soft tissue lesions anterior to the heart, 10 mm pleural-based lesion, level 2 cervical nodes, hypermetabolism hepatic dome mottled FDG accumulation throughout the skeletal system    06/06/2023 - 07/14/2023 Anti-estrogen oral therapy   Antiestrogen therapy with Verzenio and letrozole    07/07/2023 Relapse/Recurrence   Increasing mediastinal mass 11.6 cm (used to be 10.8 cm) effacing the heart and involving the sternum, additional mediastinal/pericardial lymph nodes increased right extra lymph node 1.5 cm, nodules 0.2 cm (used to be 0.8 cm), few new scattered lung nodules)   07/25/2023 -  Chemotherapy   Patient is on Treatment Plan : BREAST METASTATIC Sacituzumab govitecan-hziy [name]) D1,8 q21d     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  MEDICATIONS:  Current Outpatient Medications  Medication Sig Dispense Refill   acetaminophen (TYLENOL) 500 MG tablet Take 500 mg by mouth every 6 (six) hours as needed. For pain     amLODipine (NORVASC) 5 MG tablet Take 10 mg by mouth daily.  6   carisoprodol (SOMA) 350 MG tablet Take 350 mg by mouth daily as needed for muscle spasms.     Cyanocobalamin (VITAMIN B-12) 5000 MCG TBDP Take 5,000 mcg by mouth daily.     ibuprofen (ADVIL) 800 MG tablet Take 1  tablet (800 mg total) by mouth every 8 (eight) hours as needed. 30 tablet 0   lidocaine-prilocaine (EMLA) cream Apply to affected area once 30 g 3   loratadine (CLARITIN) 10 MG tablet Take 10 mg by mouth daily as needed for allergies.     magnesium oxide (MAG-OX) 400 (240 Mg) MG tablet Take 1 tablet (400 mg total) by mouth daily. 30 tablet 1   metoprolol succinate (TOPROL XL) 25 MG 24 hr tablet Take 1 tablet (25 mg total) by mouth daily. 90 tablet 3   Omega-3 Fatty Acids (FISH OIL MAXIMUM STRENGTH) 1200 MG CPDR Take 1,200 mg by mouth daily.     ondansetron (ZOFRAN) 8 MG tablet Take 1 tablet (8 mg total) by mouth every 8 (eight) hours as needed for nausea or vomiting. 20 tablet 0   ondansetron (ZOFRAN) 8 MG tablet Take 1 tablet (8 mg total) by mouth every 8 (eight) hours as needed for nausea or vomiting. Take 1 tablets (8mg  total) by mouth every 8hrs as needed. Start on the third day after chemotherapy. 30 tablet 1   oxyCODONE-acetaminophen (PERCOCET/ROXICET) 5-325 MG tablet Take 1 tablet by mouth every 8 (eight) hours as needed for severe pain. 30 tablet 0   prochlorperazine (COMPAZINE) 10 MG tablet Take 1 tablet (10 mg total) by mouth every 6 (six) hours as needed for nausea or vomiting. 30 tablet 6   traMADol (ULTRAM) 50 MG tablet Take 1 tablet (50 mg total) by mouth every 6 (six) hours as needed. 30 tablet 0   Vitamin D, Ergocalciferol, (DRISDOL) 1.25 MG (50000 UNIT) CAPS capsule Take 50,000 Units by mouth once a week.     No current facility-administered medications for this visit.   Facility-Administered Medications Ordered in Other Visits  Medication Dose Route Frequency Provider Last Rate Last Admin   lactated ringers infusion    Continuous PRN Andree Elk, CRNA   New Bag at 11/14/17 0700    VITAL SIGNS: LMP 09/19/2018  There were no vitals filed for this visit.  Estimated body mass index is 33.25 kg/m as calculated from the following:   Height as of 09/29/23: 5\' 6"  (1.676  m).   Weight as of 09/29/23: 206 lb (93.4 kg).  LABS: CBC:    Component Value Date/Time   WBC 3.2 (L) 09/26/2023 0910   WBC 5.1 05/31/2023 0831   HGB 9.9 (L) 09/26/2023 0910   HGB 12.2 07/26/2016 1650   HCT 29.8 (L) 09/26/2023 0910   HCT 37.5 07/26/2016 1650   PLT 281 09/26/2023 0910  PLT 294 07/26/2016 1650   MCV 87.9 09/26/2023 0910   MCV 80 07/26/2016 1650   NEUTROABS 2.4 09/26/2023 0910   LYMPHSABS 0.3 (L) 09/26/2023 0910   MONOABS 0.3 09/26/2023 0910   EOSABS 0.1 09/26/2023 0910   BASOSABS 0.0 09/26/2023 0910   Comprehensive Metabolic Panel:    Component Value Date/Time   NA 137 09/26/2023 0910   NA 140 07/26/2016 1650   K 3.3 (L) 09/26/2023 0910   CL 100 09/26/2023 0910   CO2 29 09/26/2023 0910   BUN 12 09/26/2023 0910   BUN 13 07/26/2016 1650   CREATININE 0.87 09/26/2023 0910   GLUCOSE 103 (H) 09/26/2023 0910   CALCIUM 9.4 09/26/2023 0910   AST 14 (L) 09/26/2023 0910   ALT 18 09/26/2023 0910   ALKPHOS 85 09/26/2023 0910   BILITOT 0.4 09/26/2023 0910   PROT 7.3 09/26/2023 0910   PROT 7.4 07/26/2016 1650   ALBUMIN 3.8 09/26/2023 0910   ALBUMIN 4.2 07/26/2016 1650    RADIOGRAPHIC STUDIES:  CT CHEST ABDOMEN PELVIS W CONTRAST  Result Date: 09/20/2023 CLINICAL DATA:  History of invasive breast cancer, follow-up. * Tracking Code: BO * EXAM: CT CHEST, ABDOMEN, AND PELVIS WITH CONTRAST TECHNIQUE: Multidetector CT imaging of the chest, abdomen and pelvis was performed following the standard protocol during bolus administration of intravenous contrast. RADIATION DOSE REDUCTION: This exam was performed according to the departmental dose-optimization program which includes automated exposure control, adjustment of the mA and/or kV according to patient size and/or use of iterative reconstruction technique. CONTRAST:  OMNIPAQUE IOHEXOL 300 MG/ML  SOLN COMPARISON:  Multiple priors including most recent CT July 07, 2023 FINDINGS: CT CHEST FINDINGS Cardiovascular:  Right chest Port-A-Cath with tip in the SVC. Left chest cardiac pacemaker with unchanged position of the leads. Aortic atherosclerosis. No central pulmonary embolus on this nondedicated study. Normal size heart. Similar trace pericardial effusion. Mediastinum/Nodes: Decreased size of the large invasive anterior mediastinal mass which effaces the heart and invades the sternum/chest wall now measuring 7.9 x 5.9 cm on image 22/2 previously 11.6 x 7.6 cm. Additional mediastinal/pericardial and right axillary nodes have decreased in size. For reference: -pericardial lymph node measures 13 mm in short axis on image 38/2 previously 2 cm. -right axillary lymph node measures 9 mm in short axis on image 16/2 previously 15 mm. Bilateral axillary surgical clips. Lungs/Pleura: Pleural-based left lower lobe pulmonary nodule measures 7 mm on image 117/7 previously 12 mm. Decreased subtle pleural nodularity along the left lower lateral aspect for instance on image 109/7. Scattered pulmonary nodules which were new on prior examination are not seen on today's study. New patchy ground-glass opacities predominantly within the left lung. Musculoskeletal: Stable sclerotic focus in the T1 vertebral body. Similar osseous involvement of the sternum by the anterior mediastinal mass. No new suspicious osseous lesions identified. CT ABDOMEN PELVIS FINDINGS Hepatobiliary: Decreased size of the segment IV hepatic hypodensity now measuring 16 mm on image 39/2 previously 19 mm. Progressive hypodensity along the falciform ligament commonly reflecting focal fatty infiltration/differential perfusion but warranting attention on follow-up imaging. No new suspicious hepatic lesion. Gallbladder is unremarkable.  No biliary ductal dilation. Pancreas: No pancreatic ductal dilation or evidence of acute inflammation. Spleen: No splenomegaly. Adrenals/Urinary Tract: Bilateral adrenal glands appear normal. No hydronephrosis. Kidneys demonstrate symmetric  enhancement. Urinary bladder is unremarkable for degree of distension. Stomach/Bowel: Stomach is unremarkable for degree of distension. No pathologic dilation of small or large bowel. No evidence of acute bowel inflammation. Vascular/Lymphatic: Scattered aortic atherosclerosis.  Smooth IVC contours. Mixing artifact in the IVC. Normal caliber abdominal aorta. The portal, splenic and superior mesenteric veins are patent. No pathologically enlarged abdominal or pelvic lymph nodes. Reproductive: Uterus and bilateral adnexa are unremarkable. Other: No significant abdominopelvic free fluid. Pelvic floor laxity. Musculoskeletal: No aggressive lytic or blastic lesion of bone. IMPRESSION: 1. Decreased size of the large invasive anterior mediastinal mass which effaces the heart and invades the sternum/chest wall. 2. Decreased size of the pleural-based left lower lobe pulmonary nodule and subtle pleural nodularity along the left lower lateral aspect. 3. Scattered pulmonary nodules which were new on prior examination are not seen on today's study. 4. Decreased size of the segment IV hepatic hypodensity. 5. Stable sclerotic focus in the T1 vertebral body. No new suspicious osseous lesions identified. 6. New patchy ground-glass opacities predominantly within the left lung. 7. Aortic Atherosclerosis (ICD10-I70.0). Electronically Signed   By: Maudry Mayhew M.D.   On: 09/20/2023 11:54    PERFORMANCE STATUS (ECOG) : {CHL ONC ECOG XB:[id number]}  Review of Systems Unless otherwise noted, a complete review of systems is negative.  Physical Exam General: NAD Cardiovascular: regular rate and rhythm Pulmonary: clear ant fields Abdomen: soft, nontender, + bowel sounds Extremities: no edema, no joint deformities Skin: no rashes Neurological: Alert and oriented x3  IMPRESSION: *** I introduced myself, Maygan RN, and Palliative's role in collaboration with the oncology team. Concept of Palliative Care was introduced as  specialized medical care for people and their families living with serious illness.  It focuses on providing relief from the symptoms and stress of a serious illness.  The goal is to improve quality of life for both the patient and the family. Values and goals of care important to patient and family were attempted to be elicited.    We discussed *** current illness and what it means in the larger context of *** on-going co-morbidities. Natural disease trajectory and expectations were discussed.  I discussed the importance of continued conversation with family and their medical providers regarding overall plan of care and treatment options, ensuring decisions are within the context of the patients values and GOCs.  PLAN: Established therapeutic relationship. Education provided on palliative's role in collaboration with their Oncology/Radiation team. I will plan to see patient back in 2-4 weeks in collaboration to other oncology appointments.    Patient expressed understanding and was in agreement with this plan. She also understands that She can call the clinic at any time with any questions, concerns, or complaints.   Thank you for your referral and allowing Palliative to assist in Mrs. Rejoice Comiskey Asare's care.   Number and complexity of problems addressed: ***HIGH - 1 or more chronic illnesses with SEVERE exacerbation, progression, or side effects of treatment - advanced cancer, pain. Any controlled substances utilized were prescribed in the context of palliative care.   Visit consisted of counseling and education dealing with the complex and emotionally intense issues of symptom management and palliative care in the setting of serious and potentially life-threatening illness.  Signed by: Willette Alma, AGPCNP-BC Palliative Medicine Team/[facility]   *Please note that this is a verbal dictation therefore any spelling or grammatical errors are due to the  "Dragon Medical One" system interpretation.

## 2023-10-03 ENCOUNTER — Inpatient Hospital Stay (HOSPITAL_BASED_OUTPATIENT_CLINIC_OR_DEPARTMENT_OTHER): Payer: 59 | Admitting: Nurse Practitioner

## 2023-10-03 ENCOUNTER — Inpatient Hospital Stay: Payer: 59

## 2023-10-03 ENCOUNTER — Other Ambulatory Visit: Payer: Self-pay

## 2023-10-03 ENCOUNTER — Encounter: Payer: Self-pay | Admitting: Nurse Practitioner

## 2023-10-03 VITALS — BP 133/71 | HR 104 | Temp 98.9°F | Resp 17 | Wt 204.5 lb

## 2023-10-03 VITALS — HR 98 | Resp 18

## 2023-10-03 DIAGNOSIS — Z95828 Presence of other vascular implants and grafts: Secondary | ICD-10-CM

## 2023-10-03 DIAGNOSIS — C50919 Malignant neoplasm of unspecified site of unspecified female breast: Secondary | ICD-10-CM

## 2023-10-03 DIAGNOSIS — C771 Secondary and unspecified malignant neoplasm of intrathoracic lymph nodes: Secondary | ICD-10-CM

## 2023-10-03 DIAGNOSIS — N6331 Unspecified lump in axillary tail of the right breast: Secondary | ICD-10-CM

## 2023-10-03 DIAGNOSIS — C50012 Malignant neoplasm of nipple and areola, left female breast: Secondary | ICD-10-CM

## 2023-10-03 DIAGNOSIS — Z515 Encounter for palliative care: Secondary | ICD-10-CM | POA: Diagnosis not present

## 2023-10-03 DIAGNOSIS — R630 Anorexia: Secondary | ICD-10-CM

## 2023-10-03 DIAGNOSIS — Z5111 Encounter for antineoplastic chemotherapy: Secondary | ICD-10-CM | POA: Diagnosis not present

## 2023-10-03 DIAGNOSIS — R634 Abnormal weight loss: Secondary | ICD-10-CM

## 2023-10-03 DIAGNOSIS — Z7189 Other specified counseling: Secondary | ICD-10-CM

## 2023-10-03 DIAGNOSIS — R530 Neoplastic (malignant) related fatigue: Secondary | ICD-10-CM

## 2023-10-03 LAB — CMP (CANCER CENTER ONLY)
ALT: 22 U/L (ref 0–44)
AST: 17 U/L (ref 15–41)
Albumin: 3.7 g/dL (ref 3.5–5.0)
Alkaline Phosphatase: 79 U/L (ref 38–126)
Anion gap: 8 (ref 5–15)
BUN: 19 mg/dL (ref 6–20)
CO2: 27 mmol/L (ref 22–32)
Calcium: 9.1 mg/dL (ref 8.9–10.3)
Chloride: 101 mmol/L (ref 98–111)
Creatinine: 0.89 mg/dL (ref 0.44–1.00)
GFR, Estimated: >60 mL/min (ref >60)
Glucose, Bld: 102 mg/dL — ABNORMAL HIGH (ref 70–99)
Potassium: 3.5 mmol/L (ref 3.5–5.1)
Sodium: 136 mmol/L (ref 135–145)
Total Bilirubin: 0.4 mg/dL (ref <1.2)
Total Protein: 7.3 g/dL (ref 6.5–8.1)

## 2023-10-03 LAB — CBC WITH DIFFERENTIAL (CANCER CENTER ONLY)
Abs Immature Granulocytes: 0.03 10*3/uL (ref 0.00–0.07)
Basophils Absolute: 0 10*3/uL (ref 0.0–0.1)
Basophils Relative: 1 %
Eosinophils Absolute: 0.1 10*3/uL (ref 0.0–0.5)
Eosinophils Relative: 4 %
HCT: 29.8 % — ABNORMAL LOW (ref 36.0–46.0)
Hemoglobin: 9.8 g/dL — ABNORMAL LOW (ref 12.0–15.0)
Immature Granulocytes: 1 %
Lymphocytes Relative: 10 %
Lymphs Abs: 0.3 10*3/uL — ABNORMAL LOW (ref 0.7–4.0)
MCH: 28.9 pg (ref 26.0–34.0)
MCHC: 32.9 g/dL (ref 30.0–36.0)
MCV: 87.9 fL (ref 80.0–100.0)
Monocytes Absolute: 0.4 10*3/uL (ref 0.1–1.0)
Monocytes Relative: 15 %
Neutro Abs: 1.7 10*3/uL (ref 1.7–7.7)
Neutrophils Relative %: 69 %
Platelet Count: 269 10*3/uL (ref 150–400)
RBC: 3.39 MIL/uL — ABNORMAL LOW (ref 3.87–5.11)
RDW: 15.2 % (ref 11.5–15.5)
WBC Count: 2.5 10*3/uL — ABNORMAL LOW (ref 4.0–10.5)
nRBC: 0 % (ref 0.0–0.2)

## 2023-10-03 LAB — PHOSPHORUS: Phosphorus: 3.5 mg/dL (ref 2.5–4.6)

## 2023-10-03 LAB — MAGNESIUM: Magnesium: 1.8 mg/dL (ref 1.7–2.4)

## 2023-10-03 LAB — VITAMIN B12: Vitamin B-12: 5466 pg/mL — ABNORMAL HIGH (ref 180–914)

## 2023-10-03 MED ORDER — PALONOSETRON HCL INJECTION 0.25 MG/5ML
0.2500 mg | Freq: Once | INTRAVENOUS | Status: AC
  Administered 2023-10-03: 0.25 mg via INTRAVENOUS
  Filled 2023-10-03: qty 5

## 2023-10-03 MED ORDER — SODIUM CHLORIDE 0.9% FLUSH
10.0000 mL | INTRAVENOUS | Status: DC | PRN
  Administered 2023-10-03: 10 mL

## 2023-10-03 MED ORDER — FAMOTIDINE IN NACL 20-0.9 MG/50ML-% IV SOLN
20.0000 mg | Freq: Once | INTRAVENOUS | Status: AC
  Administered 2023-10-03: 20 mg via INTRAVENOUS
  Filled 2023-10-03: qty 50

## 2023-10-03 MED ORDER — DIPHENHYDRAMINE HCL 50 MG/ML IJ SOLN
50.0000 mg | Freq: Once | INTRAMUSCULAR | Status: AC
  Administered 2023-10-03: 50 mg via INTRAVENOUS
  Filled 2023-10-03: qty 1

## 2023-10-03 MED ORDER — SODIUM CHLORIDE 0.9% FLUSH
10.0000 mL | Freq: Once | INTRAVENOUS | Status: AC
  Administered 2023-10-03: 10 mL

## 2023-10-03 MED ORDER — ACETAMINOPHEN 325 MG PO TABS
650.0000 mg | ORAL_TABLET | Freq: Once | ORAL | Status: AC
  Administered 2023-10-03: 650 mg via ORAL
  Filled 2023-10-03: qty 2

## 2023-10-03 MED ORDER — DEXAMETHASONE SODIUM PHOSPHATE 10 MG/ML IJ SOLN
10.0000 mg | Freq: Once | INTRAMUSCULAR | Status: AC
  Administered 2023-10-03: 10 mg via INTRAVENOUS
  Filled 2023-10-03: qty 1

## 2023-10-03 MED ORDER — HEPARIN SOD (PORK) LOCK FLUSH 100 UNIT/ML IV SOLN
500.0000 [IU] | Freq: Once | INTRAVENOUS | Status: AC | PRN
  Administered 2023-10-03: 500 [IU]

## 2023-10-03 MED ORDER — SODIUM CHLORIDE 0.9 % IV SOLN
150.0000 mg | Freq: Once | INTRAVENOUS | Status: AC
  Administered 2023-10-03: 150 mg via INTRAVENOUS
  Filled 2023-10-03: qty 150

## 2023-10-03 MED ORDER — SODIUM CHLORIDE 0.9 % IV SOLN
900.0000 mg | Freq: Once | INTRAVENOUS | Status: AC
  Administered 2023-10-03: 900 mg via INTRAVENOUS
  Filled 2023-10-03: qty 90

## 2023-10-03 MED ORDER — SODIUM CHLORIDE 0.9 % IV SOLN: Freq: Once | INTRAVENOUS | Status: AC

## 2023-10-03 NOTE — Patient Instructions (Signed)
[facility] - A DEPT OF [facility]  Discharge Instructions: Thank you for choosing [facility] to provide your oncology and hematology care.   If you have a lab appointment with the Cancer Center, please go directly to the Cancer Center and check in at the registration area.   Wear comfortable clothing and clothing appropriate for easy access to any Portacath or PICC line.   We strive to give you quality time with your provider. You may need to reschedule your appointment if you arrive late (15 or more minutes).  Arriving late affects you and other patients whose appointments are after yours.  Also, if you miss three or more appointments without notifying the office, you may be dismissed from the clinic at the provider's discretion.      For prescription refill requests, have your pharmacy contact our office and allow 72 hours for refills to be completed.    Today you received the following chemotherapy and/or immunotherapy agents: [name]      To help prevent nausea and vomiting after your treatment, we encourage you to take your nausea medication as directed.  BELOW ARE SYMPTOMS THAT SHOULD BE REPORTED IMMEDIATELY: *FEVER GREATER THAN 100.4 F (38 C) OR HIGHER *CHILLS OR SWEATING *NAUSEA AND VOMITING THAT IS NOT CONTROLLED WITH YOUR NAUSEA MEDICATION *UNUSUAL SHORTNESS OF BREATH *UNUSUAL BRUISING OR BLEEDING *URINARY PROBLEMS (pain or burning when urinating, or frequent urination) *BOWEL PROBLEMS (unusual diarrhea, constipation, pain near the anus) TENDERNESS IN MOUTH AND THROAT WITH OR WITHOUT PRESENCE OF ULCERS (sore throat, sores in mouth, or a toothache) UNUSUAL RASH, SWELLING OR PAIN  UNUSUAL VAGINAL DISCHARGE OR ITCHING   Items with * indicate a potential emergency and should be followed up as soon as possible or go to the Emergency Department if any problems should occur.  Please show the CHEMOTHERAPY ALERT CARD or IMMUNOTHERAPY  ALERT CARD at check-in to the Emergency Department and triage nurse.  Should you have questions after your visit or need to cancel or reschedule your appointment, please contact [facility] - A DEPT OF [facility]  Dept: [phone]  and follow the prompts.  Office hours are 8:00 a.m. to 4:30 p.m. Monday - Friday. Please note that voicemails left after 4:00 p.m. may not be returned until the following business day.  We are closed weekends and major holidays. You have access to a nurse at all times for urgent questions. Please call the main number to the clinic Dept: [phone] and follow the prompts.   For any non-urgent questions, you may also contact your provider using MyChart. We now offer e-Visits for anyone 49 and older to request care online for non-urgent symptoms. For details visit [email or web address].   Also download the MyChart app! Go to the app store, search "MyChart", open the app, select [place], and log in with your MyChart username and password.

## 2023-10-06 ENCOUNTER — Ambulatory Visit (HOSPITAL_COMMUNITY)
Admission: RE | Admit: 2023-10-06 | Discharge: 2023-10-06 | Disposition: A | Discharge status: Home / Self Care | Payer: 59 | Source: Ambulatory Visit | Attending: Internal Medicine | Admitting: Internal Medicine

## 2023-10-06 ENCOUNTER — Telehealth: Payer: Self-pay | Admitting: Hematology and Oncology

## 2023-10-06 DIAGNOSIS — R0602 Shortness of breath: Secondary | ICD-10-CM | POA: Diagnosis present

## 2023-10-06 LAB — ECHOCARDIOGRAM COMPLETE
AR max vel: 2.66 cm2
AV Area VTI: 2.85 cm2
AV Area mean vel: 2.67 cm2
AV Mean grad: 4 mm[Hg]
AV Peak grad: 8.3 mm[Hg]
Ao pk vel: 1.44 m/s
Area-P 1/2: 6.9 cm2
Est EF: 55
MV VTI: 3.37 cm2
S' Lateral: 2.7 cm

## 2023-10-06 MED ORDER — PERFLUTREN LIPID MICROSPHERE
1.0000 mL | INTRAVENOUS | Status: AC | PRN
  Administered 2023-10-06: 6 mL via INTRAVENOUS

## 2023-10-06 NOTE — Progress Notes (Signed)
*  PRELIMINARY RESULTS* Echocardiogram 2D Echocardiogram has been performed.  Carolyne Fiscal 10/06/2023, 10:56 AM

## 2023-10-06 NOTE — Telephone Encounter (Signed)
Patient is aware of upcoming scheduled appointment times/dates

## 2023-10-13 ENCOUNTER — Inpatient Hospital Stay: Payer: 59

## 2023-10-13 NOTE — Progress Notes (Signed)
[facility] CSW Counseling Note  Patient was referred by self. Treatment type: Individual  Presenting Concerns: Patient and/or family reports the following symptoms/concerns: agitation and anxiety Duration of problem: 1 year; Severity of problem: mild   Orientation:oriented to person, place, and time/date.   Affect: Congruent Risk of harm to self or others: No plan to harm self or others  Patient and/or Family's Strengths/Protective Factors: Social connections, Social and Emotional competence, Concrete supports in place (healthy food, safe environments, etc.), and Physical Health (exercise, healthy diet, medication compliance, etc.)Ability for insight  Active sense of humor  Average or above average intelligence  Capable of independent living  Arboriculturist fund of knowledge  Motivation for treatment/growth  Physical Health  Supportive family/friends      Goals Addressed: Patient will:  Reduce symptoms of: anxiety Increase knowledge and/or ability of: coping skills and stress reduction  Increase healthy adjustment to current life circumstances   Progress towards Goals: Progressing   Interventions: Interventions utilized:  CBT and Reframing      Assessment: Patient currently experiencing continued feelings with adjustment to relational impact of diagnosis. Patient's continued treatment has allowed patient an opportunity re-analyze relationships, and identify own personal strengths.       Plan: Follow up with CSW: 2 weeks on 01/02    [facility], [place]

## 2023-10-14 ENCOUNTER — Inpatient Hospital Stay: Payer: 59

## 2023-10-14 ENCOUNTER — Encounter: Payer: Self-pay | Admitting: Adult Health

## 2023-10-14 ENCOUNTER — Other Ambulatory Visit: Payer: Self-pay | Admitting: *Deleted

## 2023-10-14 ENCOUNTER — Ambulatory Visit (HOSPITAL_COMMUNITY)
Admission: RE | Admit: 2023-10-14 | Discharge: 2023-10-14 | Disposition: A | Discharge status: Home / Self Care | Payer: 59 | Source: Ambulatory Visit | Attending: Adult Health | Admitting: Adult Health

## 2023-10-14 ENCOUNTER — Encounter: Payer: Self-pay | Admitting: *Deleted

## 2023-10-14 ENCOUNTER — Telehealth: Payer: Self-pay

## 2023-10-14 ENCOUNTER — Inpatient Hospital Stay (HOSPITAL_BASED_OUTPATIENT_CLINIC_OR_DEPARTMENT_OTHER): Payer: 59 | Admitting: Adult Health

## 2023-10-14 VITALS — BP 119/74 | HR 112 | Temp 100.5°F | Resp 18 | Ht 66.0 in | Wt 200.2 lb

## 2023-10-14 DIAGNOSIS — C50012 Malignant neoplasm of nipple and areola, left female breast: Secondary | ICD-10-CM | POA: Insufficient documentation

## 2023-10-14 DIAGNOSIS — R509 Fever, unspecified: Secondary | ICD-10-CM

## 2023-10-14 DIAGNOSIS — C50919 Malignant neoplasm of unspecified site of unspecified female breast: Secondary | ICD-10-CM

## 2023-10-14 DIAGNOSIS — C771 Secondary and unspecified malignant neoplasm of intrathoracic lymph nodes: Secondary | ICD-10-CM | POA: Diagnosis not present

## 2023-10-14 DIAGNOSIS — Z5111 Encounter for antineoplastic chemotherapy: Secondary | ICD-10-CM | POA: Diagnosis not present

## 2023-10-14 DIAGNOSIS — Z95828 Presence of other vascular implants and grafts: Secondary | ICD-10-CM

## 2023-10-14 LAB — CBC WITH DIFFERENTIAL (CANCER CENTER ONLY)
Abs Immature Granulocytes: 0.02 10*3/uL (ref 0.00–0.07)
Basophils Absolute: 0 10*3/uL (ref 0.0–0.1)
Basophils Relative: 0 %
Eosinophils Absolute: 0.1 10*3/uL (ref 0.0–0.5)
Eosinophils Relative: 2 %
HCT: 27.8 % — ABNORMAL LOW (ref 36.0–46.0)
Hemoglobin: 9.1 g/dL — ABNORMAL LOW (ref 12.0–15.0)
Immature Granulocytes: 1 %
Lymphocytes Relative: 7 %
Lymphs Abs: 0.3 10*3/uL — ABNORMAL LOW (ref 0.7–4.0)
MCH: 28.3 pg (ref 26.0–34.0)
MCHC: 32.7 g/dL (ref 30.0–36.0)
MCV: 86.6 fL (ref 80.0–100.0)
Monocytes Absolute: 0.3 10*3/uL (ref 0.1–1.0)
Monocytes Relative: 8 %
Neutro Abs: 2.9 10*3/uL (ref 1.7–7.7)
Neutrophils Relative %: 82 %
Platelet Count: 254 10*3/uL (ref 150–400)
RBC: 3.21 MIL/uL — ABNORMAL LOW (ref 3.87–5.11)
RDW: 14.9 % (ref 11.5–15.5)
WBC Count: 3.5 10*3/uL — ABNORMAL LOW (ref 4.0–10.5)
nRBC: 0 % (ref 0.0–0.2)

## 2023-10-14 LAB — CMP (CANCER CENTER ONLY)
ALT: 46 U/L — ABNORMAL HIGH (ref 0–44)
AST: 30 U/L (ref 15–41)
Albumin: 3.6 g/dL (ref 3.5–5.0)
Alkaline Phosphatase: 82 U/L (ref 38–126)
Anion gap: 9 (ref 5–15)
BUN: 13 mg/dL (ref 6–20)
CO2: 29 mmol/L (ref 22–32)
Calcium: 9.1 mg/dL (ref 8.9–10.3)
Chloride: 97 mmol/L — ABNORMAL LOW (ref 98–111)
Creatinine: 0.87 mg/dL (ref 0.44–1.00)
GFR, Estimated: >60 mL/min (ref >60)
Glucose, Bld: 136 mg/dL — ABNORMAL HIGH (ref 70–99)
Potassium: 3.2 mmol/L — ABNORMAL LOW (ref 3.5–5.1)
Sodium: 135 mmol/L (ref 135–145)
Total Bilirubin: 0.6 mg/dL (ref <1.2)
Total Protein: 6.9 g/dL (ref 6.5–8.1)

## 2023-10-14 LAB — URINALYSIS, COMPLETE (UACMP) WITH MICROSCOPIC
Bacteria, UA: NONE SEEN
Glucose, UA: NEGATIVE mg/dL
Hgb urine dipstick: NEGATIVE
Ketones, ur: NEGATIVE mg/dL
Nitrite: NEGATIVE
Protein, ur: 30 mg/dL — AB
Specific Gravity, Urine: 1.031 — ABNORMAL HIGH (ref 1.005–1.030)
pH: 5 (ref 5.0–8.0)

## 2023-10-14 LAB — RESP PANEL BY RT-PCR (RSV, FLU A&B, COVID)  RVPGX2
Influenza A by PCR: NEGATIVE
Influenza B by PCR: NEGATIVE
Resp Syncytial Virus by PCR: NEGATIVE
SARS Coronavirus 2 by RT PCR: NEGATIVE

## 2023-10-14 MED ORDER — HEPARIN SOD (PORK) LOCK FLUSH 100 UNIT/ML IV SOLN
500.0000 [IU] | Freq: Once | INTRAVENOUS | Status: AC
  Administered 2023-10-14: 500 [IU]

## 2023-10-14 MED ORDER — VITAMIN D (ERGOCALCIFEROL) 1.25 MG (50000 UNIT) PO CAPS: 50000.0000 [IU] | ORAL_CAPSULE | ORAL | 2 refills | Status: DC

## 2023-10-14 MED ORDER — SODIUM CHLORIDE 0.9% FLUSH
10.0000 mL | Freq: Once | INTRAVENOUS | Status: AC
  Administered 2023-10-14: 10 mL

## 2023-10-14 MED ORDER — CIPROFLOXACIN HCL 500 MG PO TABS: 500.0000 mg | ORAL_TABLET | Freq: Two times a day (BID) | ORAL | 0 refills | Status: DC

## 2023-10-14 MED FILL — Fosaprepitant Dimeglumine For IV Infusion 150 MG (Base Eq): INTRAVENOUS | Qty: 5 | Status: AC

## 2023-10-14 NOTE — Progress Notes (Signed)
[facility] Cancer Follow up:    Jessica Robes, MD [address] 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 KI-67 of 99%    - Pathologic: No stage assigned - Unsigned Specimen type: Core Needle Biopsy Histopathologic type: Infiltrating duct carcinoma, NOS Tumor size (mm): 66 Prognostic indicators: Triple negative. 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)  06/30/2011 Initial Diagnosis   Breast cancer, IDC, Left, Stage III, Triple negative, 7.6 cm breast mass and palpable axillary mass   09/06/2011 Miscellaneous   BRCA 1 and 2: Negative   09/14/2011 - 11/23/2011 Neo-Adjuvant Chemotherapy   Dose dense FEC followed by dose dense Taxotere   12/21/2011 Surgery   Left lumpectomy: High-grade poorly differentiated IDC 1.7 cm with high-grade DCIS, margins negative, 3/7 lymph nodes positive, ER 0%, PR 0%, HER-2 negative ratio 1.33 T1CN1 stage IIb   12/31/2011 - 02/15/2012 Radiation Therapy   Radiation at [facility]   09/26/2017 Relapse/Recurrence   Left breast upper  outer quadrant within the lumpectomy bed: Fibrosis no malignancy, right axillary lymph node biopsy metastatic high-grade carcinoma ER 0%, PR 0%, Ki-67 90%, HER-2 negative ratio 1.11 (similar to previous ductal carcinoma)   11/14/2017 - 03/06/2018 Neo-Adjuvant Chemotherapy   Neo-Adjuvant chemotherapy with Gemzar and carboplatin days 1 and 8 every 3 weeks   01/10/2018 Genetic Testing   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  The Hereditary Gene Panel offered by [facility] includes sequencing and/or deletion duplication testing of the following 47 genes: APC, ATM, AXIN2, BARD1, BMPR1A, BRCA1, BRCA2, BRIP1, CDH1, CDK4, CDKN2A (p14ARF), CDKN2A (p16INK4a), CHEK2, CTNNA1, DICER1, EPCAM (Deletion/duplication testing only), GREM1 (promoter region deletion/duplication testing only), KIT, MEN1, MLH1, MSH2, MSH3, MSH6, MUTYH, NBN, NF1, NHTL1, PALB2, PDGFRA, PMS2, POLD1, POLE, PTEN, RAD50, RAD51C, RAD51D, SDHB, SDHC, SDHD, SMAD4, SMARCA4. STK11, TP53, TSC1, TSC2, and VHL.  The following genes were evaluated for sequence changes only: SDHA and HOXB13 c.251G>A variant only. The report date is January 10, 2018.    05/31/2023 Procedure   Mediastinal mass biopsy: Metastatic poorly differentiated carcinoma compatible with breast primary ER +60%, PR 0%, HER2 0, Ki-67 40%    06/02/2023 PET scan   PET/CT Anterior mediastinal mass is hypermetabolic and centrally necrotic, soft tissue lesions anterior to the heart, 10 mm pleural-based lesion, level 2 cervical nodes, hypermetabolism hepatic dome mottled FDG accumulation throughout the skeletal system    06/06/2023 - 07/14/2023 Anti-estrogen oral therapy   Antiestrogen therapy with Verzenio and letrozole    07/07/2023 Relapse/Recurrence   Increasing mediastinal mass 11.6 cm (used to be 10.8 cm) effacing the heart and involving the sternum, additional mediastinal/pericardial lymph nodes  increased right extra lymph node 1.5 cm, nodules 0.2 cm  (used to be 0.8 cm), few new scattered lung nodules)   07/25/2023 -  Chemotherapy   Patient is on Treatment Plan : BREAST METASTATIC Sacituzumab govitecan-hziy [name]) D1,8 q21d     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)  04/06/2018 Surgery   Right axillary lymph node dissection: 1/11 node positive for metastatic high-grade carcinoma   04/25/2018 Initial Diagnosis   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  Jessica Simpson 52 y.o. female is currently undergoing treatment with [name].  She receives this treatment on days 1 and 8 of a 21 day cycle.  Monday she is scheduled for cycle 5 day 1 of therapy.  She reports significant difficulty with eating. Despite feeling hungry, they often find something wrong with the food or start coughing after eating. This has been a persistent issue for the majority of the week. The patient also reports feeling generally unwell, experiencing either sleepiness or sickness.  She is also under the care of a palliative care specialist, primarily for fatigue. The patient has been taking B12 injections, omega three daily, and vitamin D once a week. Today in clinic she has a fever of 100.74F.  She does not feel feverish.    The patient has a persistent cough, which is not new, and occasional diarrhea, although they note that they have not been eating enough to have diarrhea daily. They also experienced a minor injury to their finger,  causing discomfort.  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) 04/25/2018   Genetic testing 01/12/2018   Family history of breast cancer    Port-A-Cath in place 11/21/2017   Mass of axillary tail of right breast 08/24/2017   Pain with urination 08/24/2017   Urinary frequency 08/24/2017   Hematuria 08/24/2017   Encounter for well woman exam with routine gynecological exam 01/15/2016   Benign cyst of right breast 11/29/2015   Positive fecal occult blood test 07/22/2014   History of breast cancer 07/22/2014   Rectal bleeding 07/10/2014   Lymphedema 06/15/2012   BRCA1 negative 10/22/2011   BRCA2 negative 10/22/2011   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  MEDICAL HISTORY: Past Medical History:  Diagnosis Date   BRCA1 negative 10/22/2011   BRCA2 negative 10/22/2011   Breast cancer, IDC, Left, Stage III, Triple negative 06/30/2011  Breast disorder    Contraceptive education 01/15/2016   Cough    Dyspnea    Family history of breast cancer    Fracture    right 4th toe   History of breast cancer 07/22/2014   History of radiation therapy 05/16/18- 06/22/18   Right Breast/ 50.4 Gy in 28 fractions. Right posterior axilla and SCV nodes/ 50.4 Gy in 28 fractions.    Hypertension    Lymphedema 06/15/2012   Lymphedema of arm    Papanicolaou smear of  cervix with positive high risk human papilloma virus (HPV) test 12/25/2020   12/19/2020 repeat pap in 1 year per ASCCP guidelines, 5 year CIN3+risk is 2.25%   Personal history of chemotherapy    Personal history of radiation therapy    Positive fecal occult blood test 07/22/2014   Presence of permanent cardiac pacemaker    S/P radiation therapy 2013   50 gray in 25 fractions to the left breast, supraclavicular, and axillary regions. She then received a boost to the left lumpectomy of 10 gray in 5 fractions. This was given at [facility] [place].   Sleep apnea    Status post chemotherapy Comp. 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   AXILLARY LYMPH NODE BIOPSY Right 04/06/2018   AXILLARY LYMPH NODE DISSECTION Right 04/06/2018   Procedure: RIGHT AXILLARY LYMPH NODE DISSECTION;  Surgeon: Emelia Loron, MD;  Location: [facility] OR;  Service: General;  Laterality: Right;   BREAST BIOPSY Left 2012   BREAST BIOPSY Right 2019   BREAST LUMPECTOMY Left 2012   BREAST LUMPECTOMY Right 2019   rt axilla   BREAST SURGERY     CARDIAC ELECTROPHYSIOLOGY MAPPING AND ABLATION     CESAREAN SECTION     COLONOSCOPY N/A 08/14/2014   Procedure: COLONOSCOPY;  Surgeon: Malissa Hippo, MD;  Location: [facility];  Service: Endoscopy;  Laterality: N/A;  930   EVACUATION BREAST HEMATOMA  12/21/2011   Procedure: EVACUATION HEMATOMA BREAST;  Surgeon: Almond Lint, MD;  Location: [facility] OR;  Service: General;  Laterality: Left;   HAND SURGERY     tumor on finger, left hand   INCISION AND DRAINAGE ABSCESS ANAL     x3   IR IMAGING GUIDED PORT INSERTION  08/01/2023   left breast biopsy with axillary biopsy     core bx   PORT-A-CATH REMOVAL  12/21/2011   Procedure: REMOVAL PORT-A-CATH;  Surgeon: Currie Paris, MD;  Location: [facility];  Service:  General;  Laterality: N/A;   PORTACATH PLACEMENT  08/02/2011   dr Lenord Carbo PLACEMENT Right 11/14/2017   Procedure: INSERTION PORT-A-CATH WITH [place];  Surgeon: Emelia Loron, MD;  Location: [facility] OR;  Service: General;  Laterality: Right;   removal breast mass  2004   benign   TREATMENT FISTULA ANAL     TUMOR REMOVAL     on left hand   TUMOR REMOVAL     rt. eye    SOCIAL HISTORY: Social History   Socioeconomic History   Marital status: Single    Spouse name: Not on file   Number of children: Not on file   Years of education: Not on file   Highest education level: Not on file  Occupational History   Not on file  Tobacco Use   Smoking status: Never  Smokeless tobacco: Never  Vaping Use   Vaping status: Never Used  Substance and Sexual Activity   Alcohol use: No   Drug use: No   Sexual activity: Yes    Birth control/protection: Post-menopausal  Other Topics Concern   Not on file  Social History Narrative   Not on file   Social Drivers of Health   Financial Resource Strain: Low Risk  (01/26/2022)   Overall Financial Resource Strain (CARDIA)    Difficulty of Paying Living Expenses: Not very hard  Food Insecurity: No Food Insecurity (09/29/2023)   Hunger Vital Sign    Worried About Running Out of Food in the Last Year: Never true    Ran Out of Food in the Last Year: Never true  Transportation Needs: No Transportation Needs (09/29/2023)   PRAPARE - Administrator, Civil Service (Medical): No    Lack of Transportation (Non-Medical): No  Physical Activity: Insufficiently Active (01/26/2022)   Exercise Vital Sign    Days of Exercise per Week: 3 days    Minutes of Exercise per Session: 30 min  Stress: No Stress Concern Present (01/26/2022)   [employer] of Occupational Health - Occupational Stress Questionnaire    Feeling of Stress : Only a little  Social Connections: Moderately Integrated (01/26/2022)   Social Connection and Isolation Panel [NHANES]     Frequency of Communication with Friends and Family: More than three times a week    Frequency of Social Gatherings with Friends and Family: Once a week    Attends Religious Services: More than 4 times per year    Active Member of [employer] or Organizations: Yes    Attends Engineer, structural: More than 4 times per year    Marital Status: Never married  Intimate Partner Violence: Not At Risk (09/29/2023)   Humiliation, Afraid, Rape, and Kick questionnaire    Fear of Current or Ex-Partner: No    Emotionally Abused: No    Physically Abused: No    Sexually Abused: No    FAMILY HISTORY: Family History  Problem Relation Age of Onset   Cancer Father 53       lung cancer    Cancer Maternal Grandmother        breast   Cancer Maternal Grandfather    Breast cancer Other        MGMs mother   Colon cancer Neg Hx     Review of Systems  Constitutional:  Positive for appetite change and fatigue. Negative for chills, fever and unexpected weight change.  HENT:   Negative for hearing loss, lump/mass and trouble swallowing.   Eyes:  Negative for eye problems and icterus.  Respiratory:  Positive for cough. Negative for chest tightness and shortness of breath.   Cardiovascular:  Negative for chest pain, leg swelling and palpitations.  Gastrointestinal:  Positive for diarrhea. Negative for abdominal distention, abdominal pain, constipation, nausea and vomiting.  Endocrine: Negative for hot flashes.  Genitourinary:  Negative for difficulty urinating.   Musculoskeletal:  Negative for arthralgias.  Skin:  Negative for itching and rash.  Neurological:  Negative for dizziness, extremity weakness, headaches and numbness.  Hematological:  Negative for adenopathy. Does not bruise/bleed easily.  Psychiatric/Behavioral:  Negative for depression. The patient is not nervous/anxious.       PHYSICAL EXAMINATION   Onc Performance Status - 10/14/23 0900       KPS SCALE   KPS % SCORE Normal, no  compliants, no evidence of disease  Vitals:   10/14/23 0901  BP: 119/74  Pulse: (!) 112  Resp: 18  Temp: (!) 100.5 F (38.1 C)  SpO2: 94%    Physical Exam Constitutional:      General: She is not in acute distress.    Appearance: Normal appearance. She is ill-appearing (appears slightly tired). She is not toxic-appearing.  HENT:     Head: Normocephalic and atraumatic.     Mouth/Throat:     Mouth: Mucous membranes are moist.     Pharynx: Oropharynx is clear. No oropharyngeal exudate or posterior oropharyngeal erythema.  Eyes:     General: No scleral icterus. Cardiovascular:     Rate and Rhythm: Normal rate and regular rhythm.     Pulses: Normal pulses.     Heart sounds: Normal heart sounds.  Pulmonary:     Effort: Pulmonary effort is normal.     Breath sounds: Normal breath sounds.  Abdominal:     General: Abdomen is flat. Bowel sounds are normal. There is no distension.     Palpations: Abdomen is soft.     Tenderness: There is no abdominal tenderness.  Musculoskeletal:        General: No swelling.     Cervical back: Neck supple.  Lymphadenopathy:     Cervical: No cervical adenopathy.  Skin:    General: Skin is warm and dry.     Findings: No rash.  Neurological:     General: No focal deficit present.     Mental Status: She is alert.  Psychiatric:        Mood and Affect: Mood normal.        Behavior: Behavior normal.     LABORATORY DATA:  CBC    Component Value Date/Time   WBC 3.5 (L) 10/14/2023 0826   WBC 5.1 05/31/2023 0831   RBC 3.21 (L) 10/14/2023 0826   HGB 9.1 (L) 10/14/2023 0826   HGB 12.2 07/26/2016 1650   HCT 27.8 (L) 10/14/2023 0826   HCT 37.5 07/26/2016 1650   PLT 254 10/14/2023 0826   PLT 294 07/26/2016 1650   MCV 86.6 10/14/2023 0826   MCV 80 07/26/2016 1650   MCH 28.3 10/14/2023 0826   MCHC 32.7 10/14/2023 0826   RDW 14.9 10/14/2023 0826   RDW 14.0 07/26/2016 1650   LYMPHSABS 0.3 (L) 10/14/2023 0826   MONOABS 0.3  10/14/2023 0826   EOSABS 0.1 10/14/2023 0826   BASOSABS 0.0 10/14/2023 0826    CMP     Component Value Date/Time   NA 135 10/14/2023 0826   NA 140 07/26/2016 1650   K 3.2 (L) 10/14/2023 0826   CL 97 (L) 10/14/2023 0826   CO2 29 10/14/2023 0826   GLUCOSE 136 (H) 10/14/2023 0826   BUN 13 10/14/2023 0826   BUN 13 07/26/2016 1650   CREATININE 0.87 10/14/2023 0826   CALCIUM 9.1 10/14/2023 0826   PROT 6.9 10/14/2023 0826   PROT 7.4 07/26/2016 1650   ALBUMIN 3.6 10/14/2023 0826   ALBUMIN 4.2 07/26/2016 1650   AST 30 10/14/2023 0826   ALT 46 (H) 10/14/2023 0826   ALKPHOS 82 10/14/2023 0826   BILITOT 0.6 10/14/2023 0826   GFRNONAA >60 10/14/2023 0826   GFRAA >60 12/21/2018 1402   GFRAA >60 10/24/2018 1053         ASSESSMENT and THERAPY PLAN:   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 Currently on cycle five of Trodelvy. Also seeing palliative care for fatigue management. -Continue current treatment and supportive care measures. -Consider further evaluation for potential causes of these symptoms.  Vitamin D Deficiency Patient reports taking Vitamin D once a week and is about to run out. -Sent new prescription for Vitamin D to [facility] in [place].  Low-grade Fever Patient presented with a temperature of 100.64F, but reports no feeling of feverishness. Some concern for potential  infection. -Blood culture, urinalysis/culture, chest xray, and viral panel.   -If viral panel negative will send in cipro BID per Dr. Pamelia Hoit -She knows to call if she continues to have fevers over the weekend   Intermittent Diarrhea Patient reports occasional diarrhea, but not daily. -Continue monitoring. If symptoms worsen or persist, consider further evaluation.  I reviewed the above with the patient.  We will get the above work up and will call her with results.  If viral panel negative, will send in cipro po BID.  The assessment and plan was reviewed and formulated with Dr. Pamelia Hoit.     All questions were answered. The patient knows to call the clinic with any problems, questions or concerns. We can certainly see the patient much sooner if necessary.  Total encounter time:40 minutes*in face-to-face visit time, chart review, lab review, care coordination, order entry, and documentation of the encounter time.    Lillard Anes, NP 10/14/23 1:30 PM Medical Oncology and Hematology [name] [address] [place], [place] [address] Tel. [phone]    Fax. [phone]  *Total Encounter Time as defined by the Centers for Medicare and Medicaid Services includes, in addition to the face-to-face time of a patient visit (documented in the note above) non-face-to-face time: obtaining and reviewing outside history, ordering and reviewing medications, tests or procedures, care coordination (communications with other health care professionals or caregivers) and documentation in the medical record.

## 2023-10-14 NOTE — Assessment & Plan Note (Signed)
Prior treatment: Verzinio with letrozole started 06/08/2023-07/20/2023   CT CAP: Increasing mediastinal mass 11.6 cm (used to be 10.8 cm) effacing the heart and involving the sternum, additional mediastinal/pericardial lymph nodes increased right extra lymph node 1.5 cm, nodules 0.2 cm (used to be 0.8 cm), few new scattered lung nodules)    Treatment plan: [name] started 07/25/2023 Palliative radiation to the chest 08/15/2023  Fatigue: Recommended continued energy conservation Pacemaker  Cancer Treatment Side Effects Patient reports difficulty eating, coughing, and fatigue. Currently on cycle five of Trodelvy. Also seeing palliative care for fatigue management. -Continue current treatment and supportive care measures. -Consider further evaluation for potential causes of these symptoms.  Vitamin D Deficiency Patient reports taking Vitamin D once a week and is about to run out. -Sent [facility] [place].  Low-grade Fever Patient presented with a temperature of 100.64F, but reports no feeling of feverishness. Some concern for potential infection. -Blood culture, urinalysis/culture, chest xray, and viral panel.   -If viral panel negative will send in cipro BID per Dr. Pamelia Hoit -She knows to call if she continues to have fevers over the weekend   Intermittent Diarrhea Patient reports occasional diarrhea, but not daily. -Continue monitoring. If symptoms worsen or persist, consider further evaluation.  I reviewed the above with the patient.  We will get the above work up and will call her with results.  If viral panel negative, will send in cipro po BID.  The assessment and plan was reviewed and formulated with Dr. Pamelia Hoit.

## 2023-10-14 NOTE — Telephone Encounter (Addendum)
Called pt with message below. Pt is still waiting on UA results. They should be resulted in an hr. Informed pt that she will get a call back soon as resulted. Pt verbalized understanding.----- Message from Noreene Filbert sent at 10/14/2023 12:26 PM EST ----- Chest xray and viral panel is negative. Do we know when we will have urinalysis results? ----- Message ----- From: Interface, Lab In [place] Sent: 10/14/2023  12:20 PM EST To: Loa Socks, NP

## 2023-10-17 ENCOUNTER — Inpatient Hospital Stay: Payer: 59

## 2023-10-17 VITALS — BP 121/75 | HR 101 | Temp 98.8°F | Resp 18 | Wt 198.0 lb

## 2023-10-17 DIAGNOSIS — Z5111 Encounter for antineoplastic chemotherapy: Secondary | ICD-10-CM | POA: Diagnosis not present

## 2023-10-17 DIAGNOSIS — N6331 Unspecified lump in axillary tail of the right breast: Secondary | ICD-10-CM

## 2023-10-17 DIAGNOSIS — C50919 Malignant neoplasm of unspecified site of unspecified female breast: Secondary | ICD-10-CM

## 2023-10-17 LAB — URINE CULTURE

## 2023-10-17 MED ORDER — SODIUM CHLORIDE 0.9% FLUSH
10.0000 mL | INTRAVENOUS | Status: DC | PRN
  Administered 2023-10-17: 10 mL

## 2023-10-17 MED ORDER — HEPARIN SOD (PORK) LOCK FLUSH 100 UNIT/ML IV SOLN
500.0000 [IU] | Freq: Once | INTRAVENOUS | Status: AC | PRN
  Administered 2023-10-17: 500 [IU]

## 2023-10-17 MED ORDER — DEXAMETHASONE SODIUM PHOSPHATE 10 MG/ML IJ SOLN
10.0000 mg | Freq: Once | INTRAMUSCULAR | Status: AC
  Administered 2023-10-17: 10 mg via INTRAVENOUS
  Filled 2023-10-17: qty 1

## 2023-10-17 MED ORDER — PALONOSETRON HCL INJECTION 0.25 MG/5ML
0.2500 mg | Freq: Once | INTRAVENOUS | Status: AC
  Administered 2023-10-17: 0.25 mg via INTRAVENOUS
  Filled 2023-10-17: qty 5

## 2023-10-17 MED ORDER — FAMOTIDINE IN NACL 20-0.9 MG/50ML-% IV SOLN
20.0000 mg | Freq: Once | INTRAVENOUS | Status: AC
Start: 2023-10-17 — End: 2023-10-17
  Administered 2023-10-17: 20 mg via INTRAVENOUS
  Filled 2023-10-17: qty 50

## 2023-10-17 MED ORDER — SODIUM CHLORIDE 0.9 % IV SOLN
150.0000 mg | Freq: Once | INTRAVENOUS | Status: AC
  Administered 2023-10-17: 150 mg via INTRAVENOUS
  Filled 2023-10-17: qty 150

## 2023-10-17 MED ORDER — SODIUM CHLORIDE 0.9 % IV SOLN: Freq: Once | INTRAVENOUS | Status: AC

## 2023-10-17 MED ORDER — DIPHENHYDRAMINE HCL 50 MG/ML IJ SOLN
50.0000 mg | Freq: Once | INTRAMUSCULAR | Status: AC
  Administered 2023-10-17: 50 mg via INTRAVENOUS
  Filled 2023-10-17: qty 1

## 2023-10-17 MED ORDER — SACITUZUMAB GOVITECAN-HZIY CHEMO 180 MG IV SOLR
900.0000 mg | Freq: Once | INTRAVENOUS | Status: AC
  Administered 2023-10-17: 900 mg via INTRAVENOUS
  Filled 2023-10-17: qty 90

## 2023-10-17 MED ORDER — ACETAMINOPHEN 325 MG PO TABS
650.0000 mg | ORAL_TABLET | Freq: Once | ORAL | Status: AC
  Administered 2023-10-17: 650 mg via ORAL
  Filled 2023-10-17: qty 2

## 2023-10-17 NOTE — Patient Instructions (Signed)
 [facility]  Discharge Instructions: Thank you for choosing [facility] to provide your oncology and hematology care.   If you have a lab appointment with the Cancer Center, please go directly to the Cancer Center and check in at the registration area.   Wear comfortable clothing and clothing appropriate for easy access to any Portacath or PICC line.   We strive to give you quality time with your provider. You may need to reschedule your appointment if you arrive late (15 or more minutes).  Arriving late affects you and other patients whose appointments are after yours.  Also, if you miss three or more appointments without notifying the office, you may be dismissed from the clinic at the provider's discretion.      For prescription refill requests, have your pharmacy contact our office and allow 72 hours for refills to be completed.    Today you received the following chemotherapy and/or immunotherapy agents: [name]      To help prevent nausea and vomiting after your treatment, we encourage you to take your nausea medication as directed.  BELOW ARE SYMPTOMS THAT SHOULD BE REPORTED IMMEDIATELY: *FEVER GREATER THAN 100.4 F (38 C) OR HIGHER *CHILLS OR SWEATING *NAUSEA AND VOMITING THAT IS NOT CONTROLLED WITH YOUR NAUSEA MEDICATION *UNUSUAL SHORTNESS OF BREATH *UNUSUAL BRUISING OR BLEEDING *URINARY PROBLEMS (pain or burning when urinating, or frequent urination) *BOWEL PROBLEMS (unusual diarrhea, constipation, pain near the anus) TENDERNESS IN MOUTH AND THROAT WITH OR WITHOUT PRESENCE OF ULCERS (sore throat, sores in mouth, or a toothache) UNUSUAL RASH, SWELLING OR PAIN  UNUSUAL VAGINAL DISCHARGE OR ITCHING   Items with * indicate a potential emergency and should be followed up as soon as possible or go to the Emergency Department if any problems should occur.  Please show the CHEMOTHERAPY ALERT CARD or IMMUNOTHERAPY  ALERT CARD at check-in to the Emergency Department and triage nurse.  Should you have questions after your visit or need to cancel or reschedule your appointment, please contact [facility]  Dept: [phone]  and follow the prompts.  Office hours are 8:00 a.m. to 4:30 p.m. Monday - Friday. Please note that voicemails left after 4:00 p.m. may not be returned until the following business day.  We are closed weekends and major holidays. You have access to a nurse at all times for urgent questions. Please call the main number to the clinic Dept: [phone] and follow the prompts.   For any non-urgent questions, you may also contact your provider using MyChart. We now offer e-Visits for anyone 49 and older to request care online for non-urgent symptoms. For details visit [email or web address].   Also download the MyChart app! Go to the app store, search "MyChart", open the app, select [place], and log in with your MyChart username and password.

## 2023-10-17 NOTE — Progress Notes (Signed)
Per Pamelia Hoit MD, ok to treat with pulse 105.   Per Pamelia Hoit MD, ok to treat with Mag and Phos levels from 12/9

## 2023-10-19 LAB — CULTURE, BLOOD (SINGLE): Culture: NO GROWTH

## 2023-10-21 MED FILL — Fosaprepitant Dimeglumine For IV Infusion 150 MG (Base Eq): INTRAVENOUS | Qty: 5 | Status: AC

## 2023-10-24 ENCOUNTER — Inpatient Hospital Stay: Payer: 59

## 2023-10-24 ENCOUNTER — Other Ambulatory Visit: Payer: Self-pay | Admitting: Hematology and Oncology

## 2023-10-24 DIAGNOSIS — N6331 Unspecified lump in axillary tail of the right breast: Secondary | ICD-10-CM

## 2023-10-24 DIAGNOSIS — Z5111 Encounter for antineoplastic chemotherapy: Secondary | ICD-10-CM | POA: Diagnosis not present

## 2023-10-24 DIAGNOSIS — Z95828 Presence of other vascular implants and grafts: Secondary | ICD-10-CM

## 2023-10-24 DIAGNOSIS — C50919 Malignant neoplasm of unspecified site of unspecified female breast: Secondary | ICD-10-CM

## 2023-10-24 LAB — CMP (CANCER CENTER ONLY)
ALT: 44 U/L (ref 0–44)
AST: 25 U/L (ref 15–41)
Albumin: 3.6 g/dL (ref 3.5–5.0)
Alkaline Phosphatase: 70 U/L (ref 38–126)
Anion gap: 9 (ref 5–15)
BUN: 20 mg/dL (ref 6–20)
CO2: 27 mmol/L (ref 22–32)
Calcium: 9.1 mg/dL (ref 8.9–10.3)
Chloride: 103 mmol/L (ref 98–111)
Creatinine: 0.99 mg/dL (ref 0.44–1.00)
GFR, Estimated: >60 mL/min (ref >60)
Glucose, Bld: 113 mg/dL — ABNORMAL HIGH (ref 70–99)
Potassium: 3.4 mmol/L — ABNORMAL LOW (ref 3.5–5.1)
Sodium: 139 mmol/L (ref 135–145)
Total Bilirubin: 0.4 mg/dL (ref <1.2)
Total Protein: 7.2 g/dL (ref 6.5–8.1)

## 2023-10-24 LAB — CBC WITH DIFFERENTIAL (CANCER CENTER ONLY)
Abs Immature Granulocytes: 0.02 10*3/uL (ref 0.00–0.07)
Basophils Absolute: 0 10*3/uL (ref 0.0–0.1)
Basophils Relative: 1 %
Eosinophils Absolute: 0.1 10*3/uL (ref 0.0–0.5)
Eosinophils Relative: 7 %
HCT: 29.1 % — ABNORMAL LOW (ref 36.0–46.0)
Hemoglobin: 9.6 g/dL — ABNORMAL LOW (ref 12.0–15.0)
Immature Granulocytes: 2 %
Lymphocytes Relative: 25 %
Lymphs Abs: 0.3 10*3/uL — ABNORMAL LOW (ref 0.7–4.0)
MCH: 28.3 pg (ref 26.0–34.0)
MCHC: 33 g/dL (ref 30.0–36.0)
MCV: 85.8 fL (ref 80.0–100.0)
Monocytes Absolute: 0.2 10*3/uL (ref 0.1–1.0)
Monocytes Relative: 18 %
Neutro Abs: 0.5 10*3/uL — ABNORMAL LOW (ref 1.7–7.7)
Neutrophils Relative %: 47 %
Platelet Count: 262 10*3/uL (ref 150–400)
RBC: 3.39 MIL/uL — ABNORMAL LOW (ref 3.87–5.11)
RDW: 15.5 % (ref 11.5–15.5)
WBC Count: 1.1 10*3/uL — ABNORMAL LOW (ref 4.0–10.5)
nRBC: 0 % (ref 0.0–0.2)

## 2023-10-24 LAB — MAGNESIUM: Magnesium: 1.8 mg/dL (ref 1.7–2.4)

## 2023-10-24 LAB — PHOSPHORUS: Phosphorus: 3.7 mg/dL (ref 2.5–4.6)

## 2023-10-24 MED ORDER — SODIUM CHLORIDE 0.9% FLUSH
10.0000 mL | Freq: Once | INTRAVENOUS | Status: AC
  Administered 2023-10-24: 10 mL

## 2023-10-24 NOTE — Progress Notes (Signed)
Dr Al Pimple notified of ANC 0.5. Per Dr Al Pimple, HOLD treatment today and delay for 1 week. Patient made aware and understands. Neutropenic precautions reviewed with patient and she verbalizes understanding. Port de-accessed and patient discharged.

## 2023-10-28 ENCOUNTER — Telehealth: Payer: Self-pay | Admitting: Hematology and Oncology

## 2023-10-28 NOTE — Telephone Encounter (Signed)
Patient is aware of rescheduled appointment times/dates 

## 2023-10-30 ENCOUNTER — Other Ambulatory Visit: Payer: Self-pay | Admitting: Physician Assistant

## 2023-11-02 ENCOUNTER — Inpatient Hospital Stay: Payer: 59

## 2023-11-02 ENCOUNTER — Inpatient Hospital Stay: Payer: Medicare Other | Attending: Hematology and Oncology

## 2023-11-02 ENCOUNTER — Other Ambulatory Visit: Payer: Self-pay | Admitting: Pharmacist

## 2023-11-02 ENCOUNTER — Inpatient Hospital Stay (HOSPITAL_BASED_OUTPATIENT_CLINIC_OR_DEPARTMENT_OTHER): Payer: 59 | Admitting: Hematology and Oncology

## 2023-11-02 ENCOUNTER — Encounter: Payer: Self-pay | Admitting: Hematology and Oncology

## 2023-11-02 VITALS — BP 142/81 | HR 113 | Temp 98.5°F | Resp 18 | Ht 66.0 in | Wt 193.7 lb

## 2023-11-02 VITALS — BP 124/72 | HR 92 | Temp 98.8°F | Resp 25

## 2023-11-02 DIAGNOSIS — Z79811 Long term (current) use of aromatase inhibitors: Secondary | ICD-10-CM | POA: Diagnosis not present

## 2023-11-02 DIAGNOSIS — R059 Cough, unspecified: Secondary | ICD-10-CM | POA: Diagnosis not present

## 2023-11-02 DIAGNOSIS — Z5111 Encounter for antineoplastic chemotherapy: Secondary | ICD-10-CM | POA: Diagnosis not present

## 2023-11-02 DIAGNOSIS — Z803 Family history of malignant neoplasm of breast: Secondary | ICD-10-CM | POA: Diagnosis not present

## 2023-11-02 DIAGNOSIS — Z9221 Personal history of antineoplastic chemotherapy: Secondary | ICD-10-CM | POA: Insufficient documentation

## 2023-11-02 DIAGNOSIS — R197 Diarrhea, unspecified: Secondary | ICD-10-CM | POA: Diagnosis not present

## 2023-11-02 DIAGNOSIS — N6331 Unspecified lump in axillary tail of the right breast: Secondary | ICD-10-CM

## 2023-11-02 DIAGNOSIS — C771 Secondary and unspecified malignant neoplasm of intrathoracic lymph nodes: Secondary | ICD-10-CM

## 2023-11-02 DIAGNOSIS — D6481 Anemia due to antineoplastic chemotherapy: Secondary | ICD-10-CM | POA: Diagnosis not present

## 2023-11-02 DIAGNOSIS — C50012 Malignant neoplasm of nipple and areola, left female breast: Secondary | ICD-10-CM | POA: Diagnosis present

## 2023-11-02 DIAGNOSIS — C50919 Malignant neoplasm of unspecified site of unspecified female breast: Secondary | ICD-10-CM

## 2023-11-02 DIAGNOSIS — R112 Nausea with vomiting, unspecified: Secondary | ICD-10-CM | POA: Insufficient documentation

## 2023-11-02 DIAGNOSIS — Z5189 Encounter for other specified aftercare: Secondary | ICD-10-CM | POA: Insufficient documentation

## 2023-11-02 DIAGNOSIS — Z801 Family history of malignant neoplasm of trachea, bronchus and lung: Secondary | ICD-10-CM | POA: Diagnosis not present

## 2023-11-02 DIAGNOSIS — R5383 Other fatigue: Secondary | ICD-10-CM | POA: Diagnosis not present

## 2023-11-02 DIAGNOSIS — Z171 Estrogen receptor negative status [ER-]: Secondary | ICD-10-CM | POA: Insufficient documentation

## 2023-11-02 DIAGNOSIS — Z79899 Other long term (current) drug therapy: Secondary | ICD-10-CM | POA: Insufficient documentation

## 2023-11-02 DIAGNOSIS — Z95828 Presence of other vascular implants and grafts: Secondary | ICD-10-CM

## 2023-11-02 DIAGNOSIS — Z923 Personal history of irradiation: Secondary | ICD-10-CM | POA: Insufficient documentation

## 2023-11-02 DIAGNOSIS — T451X5A Adverse effect of antineoplastic and immunosuppressive drugs, initial encounter: Secondary | ICD-10-CM | POA: Insufficient documentation

## 2023-11-02 LAB — CMP (CANCER CENTER ONLY)
ALT: 16 U/L (ref 0–44)
AST: 16 U/L (ref 15–41)
Albumin: 3.7 g/dL (ref 3.5–5.0)
Alkaline Phosphatase: 83 U/L (ref 38–126)
Anion gap: 9 (ref 5–15)
BUN: 18 mg/dL (ref 6–20)
CO2: 28 mmol/L (ref 22–32)
Calcium: 9.3 mg/dL (ref 8.9–10.3)
Chloride: 102 mmol/L (ref 98–111)
Creatinine: 1.11 mg/dL — ABNORMAL HIGH (ref 0.44–1.00)
GFR, Estimated: 60 mL/min — ABNORMAL LOW (ref >60)
Glucose, Bld: 114 mg/dL — ABNORMAL HIGH (ref 70–99)
Potassium: 3.2 mmol/L — ABNORMAL LOW (ref 3.5–5.1)
Sodium: 139 mmol/L (ref 135–145)
Total Bilirubin: 0.5 mg/dL (ref 0.0–1.2)
Total Protein: 7.2 g/dL (ref 6.5–8.1)

## 2023-11-02 LAB — CBC WITH DIFFERENTIAL (CANCER CENTER ONLY)
Abs Immature Granulocytes: 0.03 10*3/uL (ref 0.00–0.07)
Basophils Absolute: 0 10*3/uL (ref 0.0–0.1)
Basophils Relative: 1 %
Eosinophils Absolute: 0.1 10*3/uL (ref 0.0–0.5)
Eosinophils Relative: 4 %
HCT: 31.5 % — ABNORMAL LOW (ref 36.0–46.0)
Hemoglobin: 9.8 g/dL — ABNORMAL LOW (ref 12.0–15.0)
Immature Granulocytes: 1 %
Lymphocytes Relative: 11 %
Lymphs Abs: 0.4 10*3/uL — ABNORMAL LOW (ref 0.7–4.0)
MCH: 27.3 pg (ref 26.0–34.0)
MCHC: 31.1 g/dL (ref 30.0–36.0)
MCV: 87.7 fL (ref 80.0–100.0)
Monocytes Absolute: 0.3 10*3/uL (ref 0.1–1.0)
Monocytes Relative: 10 %
Neutro Abs: 2.5 10*3/uL (ref 1.7–7.7)
Neutrophils Relative %: 73 %
Platelet Count: 312 10*3/uL (ref 150–400)
RBC: 3.59 MIL/uL — ABNORMAL LOW (ref 3.87–5.11)
RDW: 17.2 % — ABNORMAL HIGH (ref 11.5–15.5)
WBC Count: 3.4 10*3/uL — ABNORMAL LOW (ref 4.0–10.5)
nRBC: 0.6 % — ABNORMAL HIGH (ref 0.0–0.2)

## 2023-11-02 LAB — PHOSPHORUS: Phosphorus: 3.9 mg/dL (ref 2.5–4.6)

## 2023-11-02 LAB — MAGNESIUM: Magnesium: 1.9 mg/dL (ref 1.7–2.4)

## 2023-11-02 MED ORDER — DIPHENHYDRAMINE HCL 50 MG/ML IJ SOLN
50.0000 mg | Freq: Once | INTRAMUSCULAR | Status: AC
  Administered 2023-11-02: 50 mg via INTRAVENOUS
  Filled 2023-11-02: qty 1

## 2023-11-02 MED ORDER — SODIUM CHLORIDE 0.9 % IV SOLN
150.0000 mg | Freq: Once | INTRAVENOUS | Status: AC
  Administered 2023-11-02: 150 mg via INTRAVENOUS
  Filled 2023-11-02: qty 150

## 2023-11-02 MED ORDER — SODIUM CHLORIDE 0.9% FLUSH
10.0000 mL | INTRAVENOUS | Status: DC | PRN
  Administered 2023-11-02: 10 mL

## 2023-11-02 MED ORDER — DEXAMETHASONE SODIUM PHOSPHATE 10 MG/ML IJ SOLN
10.0000 mg | Freq: Once | INTRAMUSCULAR | Status: AC
  Administered 2023-11-02: 10 mg via INTRAVENOUS
  Filled 2023-11-02: qty 1

## 2023-11-02 MED ORDER — PALONOSETRON HCL INJECTION 0.25 MG/5ML
0.2500 mg | Freq: Once | INTRAVENOUS | Status: AC
  Administered 2023-11-02: 0.25 mg via INTRAVENOUS
  Filled 2023-11-02: qty 5

## 2023-11-02 MED ORDER — FAMOTIDINE IN NACL 20-0.9 MG/50ML-% IV SOLN
20.0000 mg | Freq: Once | INTRAVENOUS | Status: AC
  Administered 2023-11-02: 20 mg via INTRAVENOUS
  Filled 2023-11-02: qty 50

## 2023-11-02 MED ORDER — ACETAMINOPHEN 325 MG PO TABS
650.0000 mg | ORAL_TABLET | Freq: Once | ORAL | Status: AC
  Administered 2023-11-02: 650 mg via ORAL
  Filled 2023-11-02: qty 2

## 2023-11-02 MED ORDER — ONDANSETRON HCL 8 MG PO TABS: 8.0000 mg | ORAL_TABLET | Freq: Two times a day (BID) | ORAL | 6 refills | Status: DC

## 2023-11-02 MED ORDER — SODIUM CHLORIDE 0.9% FLUSH
10.0000 mL | Freq: Once | INTRAVENOUS | Status: AC
  Administered 2023-11-02: 10 mL

## 2023-11-02 MED ORDER — SACITUZUMAB GOVITECAN-HZIY CHEMO 180 MG IV SOLR
7.5000 mg/kg | Freq: Once | INTRAVENOUS | Status: AC
  Administered 2023-11-02: 720 mg via INTRAVENOUS
  Filled 2023-11-02: qty 72

## 2023-11-02 MED ORDER — HEPARIN SOD (PORK) LOCK FLUSH 100 UNIT/ML IV SOLN
500.0000 [IU] | Freq: Once | INTRAVENOUS | Status: AC | PRN
  Administered 2023-11-02: 500 [IU]

## 2023-11-02 MED ORDER — SODIUM CHLORIDE 0.9 % IV SOLN: Freq: Once | INTRAVENOUS | Status: AC

## 2023-11-02 NOTE — Assessment & Plan Note (Signed)
 Prior treatment: Verzinio with letrozole  started 06/08/2023-07/20/2023   CT CAP: Increasing mediastinal mass 11.6 cm (used to be 10.8 cm) effacing the heart and involving the sternum, additional mediastinal/pericardial lymph nodes increased right extra lymph node 1.5 cm, nodules 0.2 cm (used to be 0.8 cm), few new scattered lung nodules)    Treatment plan: Trodelvy  started 07/25/2023, today is cycle 5 day 8 Palliative radiation to the chest may start 08/15/2023   Chest discomfort: Secondary to malignancy.   Shortness of breath: Again related to the mass.   Fatigue: Combination of being on chemo as well as pacemaker issues.   Trodelvy  toxicities: Severe fatigue Monitoring blood levels Hypomagnesemia: Oral magnesium  supplementation   CT CAP 09/20/2023: Decrease in the size of the anterior mediastinal mass that effaces the heart and invades the sternum/chest wall, decreased size of the pleural-based left lower lobe lung nodule.  Scattered pulmonary nodules not visualized.  Decreased hepatic metastases.  Stable T1    Based on above CT scans patient is responding to treatment and therefore we will continue with the same. We gradually increased the dosage of Trodelvy  to 10 mg/kg   Chronic Cough Multiple episodes of coughing, possibly related to post-nasal drip. -Try Mucinex and daily Claritin  to manage symptoms.   Return to clinic day 1 day 8 for chemo and I will see her every 3 weeks. Our plan is to do 6 rounds of chemo and then obtain a PET CT scan.

## 2023-11-02 NOTE — Patient Instructions (Signed)
 [facility]  Discharge Instructions: Thank you for choosing [facility] to provide your oncology and hematology care.   If you have a lab appointment with the Cancer Center, please go directly to the Cancer Center and check in at the registration area.   Wear comfortable clothing and clothing appropriate for easy access to any Portacath or PICC line.   We strive to give you quality time with your provider. You may need to reschedule your appointment if you arrive late (15 or more minutes).  Arriving late affects you and other patients whose appointments are after yours.  Also, if you miss three or more appointments without notifying the office, you may be dismissed from the clinic at the provider's discretion.      For prescription refill requests, have your pharmacy contact our office and allow 72 hours for refills to be completed.    Today you received the following chemotherapy and/or immunotherapy agents: [name]      To help prevent nausea and vomiting after your treatment, we encourage you to take your nausea medication as directed.  BELOW ARE SYMPTOMS THAT SHOULD BE REPORTED IMMEDIATELY: *FEVER GREATER THAN 100.4 F (38 C) OR HIGHER *CHILLS OR SWEATING *NAUSEA AND VOMITING THAT IS NOT CONTROLLED WITH YOUR NAUSEA MEDICATION *UNUSUAL SHORTNESS OF BREATH *UNUSUAL BRUISING OR BLEEDING *URINARY PROBLEMS (pain or burning when urinating, or frequent urination) *BOWEL PROBLEMS (unusual diarrhea, constipation, pain near the anus) TENDERNESS IN MOUTH AND THROAT WITH OR WITHOUT PRESENCE OF ULCERS (sore throat, sores in mouth, or a toothache) UNUSUAL RASH, SWELLING OR PAIN  UNUSUAL VAGINAL DISCHARGE OR ITCHING   Items with * indicate a potential emergency and should be followed up as soon as possible or go to the Emergency Department if any problems should occur.  Please show the CHEMOTHERAPY ALERT CARD or IMMUNOTHERAPY  ALERT CARD at check-in to the Emergency Department and triage nurse.  Should you have questions after your visit or need to cancel or reschedule your appointment, please contact [facility]  Dept: [phone]  and follow the prompts.  Office hours are 8:00 a.m. to 4:30 p.m. Monday - Friday. Please note that voicemails left after 4:00 p.m. may not be returned until the following business day.  We are closed weekends and major holidays. You have access to a nurse at all times for urgent questions. Please call the main number to the clinic Dept: [phone] and follow the prompts.   For any non-urgent questions, you may also contact your provider using MyChart. We now offer e-Visits for anyone 66 and older to request care online for non-urgent symptoms. For details visit [email or web address].   Also download the MyChart app! Go to the app store, search "MyChart", open the app, select [place], and log in with your MyChart username and password.

## 2023-11-02 NOTE — Progress Notes (Signed)
 Patient Care Team: [name], MD as PCP - General (Family Medicine) Odean Potts, MD as Consulting Physician (Hematology and Oncology) Pickenpack-Cousar, Fannie SAILOR, NP as Nurse Practitioner [facility] and Palliative Medicine)  DIAGNOSIS:  Encounter Diagnosis  Name Primary?   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)  06/30/2011 Initial Diagnosis   Breast cancer, IDC, Left, Stage III, Triple negative, 7.6 cm breast mass and palpable axillary mass   09/06/2011 Miscellaneous   BRCA 1 and 2: Negative   09/14/2011 - 11/23/2011 Neo-Adjuvant Chemotherapy   Dose dense FEC followed by dose dense Taxotere    12/21/2011 Surgery   Left lumpectomy: High-grade poorly differentiated IDC 1.7 cm with high-grade DCIS, margins negative, 3/7 lymph nodes positive, ER 0%, PR 0%, HER-2 negative ratio 1.33 T1CN1 stage IIb   12/31/2011 - 02/15/2012 Radiation Therapy   Radiation at [facility]   09/26/2017 Relapse/Recurrence   Left breast upper outer quadrant within the lumpectomy bed: Fibrosis no malignancy, right axillary lymph node biopsy metastatic high-grade carcinoma ER 0%, PR 0%, Ki-67 90%, HER-2 negative ratio 1.11 (similar to previous ductal carcinoma)   11/14/2017 - 03/06/2018 Neo-Adjuvant Chemotherapy   Neo-Adjuvant chemotherapy with Gemzar  and carboplatin  days 1 and 8 every 3 weeks   01/10/2018 Genetic Testing   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  The Hereditary Gene Panel offered by [facility] includes sequencing and/or deletion duplication testing of the following 47 genes: APC, ATM, AXIN2, BARD1, BMPR1A, BRCA1, BRCA2, BRIP1, CDH1, CDK4, CDKN2A (p14ARF), CDKN2A (p16INK4a), CHEK2, CTNNA1, DICER1, EPCAM (Deletion/duplication testing only), GREM1 (promoter region deletion/duplication testing  only), KIT, MEN1, MLH1, MSH2, MSH3, MSH6, MUTYH, NBN, NF1, NHTL1, PALB2, PDGFRA, PMS2, POLD1, POLE, PTEN, RAD50, RAD51C, RAD51D, SDHB, SDHC, SDHD, SMAD4, SMARCA4. STK11, TP53, TSC1, TSC2, and VHL.  The following genes were evaluated for sequence changes only: SDHA and HOXB13 c.251G>A variant only. The report date is January 10, 2018.    05/31/2023 Procedure   Mediastinal mass biopsy: Metastatic poorly differentiated carcinoma compatible with breast primary ER +60%, PR 0%, HER2 0, Ki-67 40%    06/02/2023 PET scan   PET/CT Anterior mediastinal mass is hypermetabolic and centrally necrotic, soft tissue lesions anterior to the heart, 10 mm pleural-based lesion, level 2 cervical nodes, hypermetabolism hepatic dome mottled FDG accumulation throughout the skeletal system    06/06/2023 - 07/14/2023 Anti-estrogen oral therapy   Antiestrogen therapy with Verzenio  and letrozole     07/07/2023 Relapse/Recurrence   Increasing mediastinal mass 11.6 cm (used to be 10.8 cm) effacing the heart and involving the sternum, additional mediastinal/pericardial lymph nodes increased right extra lymph node 1.5 cm, nodules 0.2 cm (used to be 0.8 cm), few new scattered lung nodules)   07/25/2023 -  Chemotherapy   Patient is on Treatment Plan : BREAST METASTATIC Sacituzumab govitecan -hziy (Trodelvy ) D1,8 q21d     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)  04/06/2018 Surgery   Right axillary lymph node dissection: 1/11 node positive for metastatic high-grade carcinoma   04/25/2018 Initial Diagnosis   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)   04/25/2018 Cancer Staging   Staging form: Breast, AJCC 8th Edition - Clinical: Stage IIB (cT0, cN1, cM0, G3, ER-, PR-, HER2-) - Signed by Izell Domino, MD on 04/25/2018   05/16/2018 - 06/22/2018 Radiation Therapy   Adjuvant radiation therapy with Xeloda    07/11/2018 -  10/24/2018 Chemotherapy   Adjuvant chemotherapy with CMF     CHIEF COMPLIANT: Cycle 5-day 8 of  Trodelvy   HISTORY OF PRESENT ILLNESS:  History of Present Illness   The patient, with a history of met breast cancer, presents with severe side effects from her current medication regimen. She reports experiencing nausea, vomiting, and diarrhea. The patient describes the nausea as being triggered by any smell and the vomiting as being unpredictable. She has been managing the diarrhea with Imodium, but it continues to occur intermittently. The patient also reports feeling tired and weak, likely due to the lack of nutrition from the vomiting and diarrhea. She has not yet had a PET scan scheduled. The patient also mentions a cough and drainage, which has been ongoing. She has been trying Claritin  and Mucinex to manage these symptoms. The patient also reports feeling wiped out and tired after walking upstairs, likely due to anemia.         ALLERGIES:  is allergic to codeine and hydrocodone-acetaminophen .  MEDICATIONS:  Current Outpatient Medications  Medication Sig Dispense Refill   acetaminophen  (TYLENOL ) 500 MG tablet Take 500 mg by mouth every 6 (six) hours as needed. For pain (Patient not taking: Reported on 10/14/2023)     amLODipine  (NORVASC ) 5 MG tablet Take 10 mg by mouth daily.  6   Cyanocobalamin  (VITAMIN B-12) 5000 MCG TBDP Take 5,000 mcg by mouth daily.     ibuprofen  (ADVIL ) 800 MG tablet Take 1 tablet (800 mg total) by mouth every 8 (eight) hours as needed. (Patient not taking: Reported on 10/14/2023) 30 tablet 0   lidocaine -prilocaine  (EMLA ) cream Apply to affected area once 30 g 3   loratadine  (CLARITIN ) 10 MG tablet Take 10 mg by mouth daily as needed for allergies.     magnesium  oxide (MAG-OX) 400 (240 Mg) MG tablet Take 1 tablet (400 mg total) by mouth daily. 30 tablet 1   metoprolol  succinate (TOPROL  XL) 25 MG 24 hr tablet Take 1 tablet (25 mg total) by mouth daily. 90 tablet 3   Omega-3 Fatty Acids (FISH OIL MAXIMUM STRENGTH) 1200 MG CPDR Take 1,200 mg by mouth daily.      ondansetron  (ZOFRAN ) 8 MG tablet Take 1 tablet (8 mg total) by mouth 2 (two) times daily. 60 tablet 6   prochlorperazine  (COMPAZINE ) 10 MG tablet Take 1 tablet (10 mg total) by mouth every 6 (six) hours as needed for nausea or vomiting. (Patient not taking: Reported on 10/14/2023) 30 tablet 6   Vitamin D , Ergocalciferol , (DRISDOL ) 1.25 MG (50000 UNIT) CAPS capsule Take 1 capsule (50,000 Units total) by mouth once a week. 12 capsule 2   No current facility-administered medications for this visit.    PHYSICAL EXAMINATION: ECOG PERFORMANCE STATUS: 1 - Symptomatic but completely ambulatory  Vitals:   11/02/23 0800  BP: (!) 142/81  Pulse: (!) 113  Resp: 18  Temp: 98.5 F (36.9 C)  SpO2: 95%   Filed Weights   11/02/23 0800  Weight: 193 lb 11.2 oz (87.9 kg)    Physical Exam          (exam performed in the presence of a chaperone)  LABORATORY DATA:  I have reviewed the data as listed    Latest Ref Rng & Units 10/24/2023    8:07 AM 10/14/2023    8:26 AM 10/03/2023    8:40 AM  CMP  Glucose 70 - 99 mg/dL 886  863  897   BUN 6 - 20 mg/dL 20  13  19   Creatinine 0.44 - 1.00 mg/dL 9.00  9.12  9.10   Sodium 135 - 145 mmol/L 139  135  136   Potassium 3.5 - 5.1 mmol/L 3.4  3.2  3.5   Chloride 98 - 111 mmol/L 103  97  101   CO2 22 - 32 mmol/L 27  29  27    Calcium 8.9 - 10.3 mg/dL 9.1  9.1  9.1   Total Protein 6.5 - 8.1 g/dL 7.2  6.9  7.3   Total Bilirubin <1.2 mg/dL 0.4  0.6  0.4   Alkaline Phos 38 - 126 U/L 70  82  79   AST 15 - 41 U/L 25  30  17    ALT 0 - 44 U/L 44  46  22     Lab Results  Component Value Date   WBC 3.4 (L) 11/02/2023   HGB 9.8 (L) 11/02/2023   HCT 31.5 (L) 11/02/2023   MCV 87.7 11/02/2023   PLT 312 11/02/2023   NEUTROABS 2.5 11/02/2023    ASSESSMENT & PLAN:  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   Shortness of breath: Again related to the mass.   Fatigue: Combination of being on chemo as well as pacemaker issues.   Trodelvy  toxicities: Severe fatigue Severe nausea intermittently to smells: Recommended that she take Zofran  scheduled twice a day.  I sent a new prescription today. Diarrhea intermittently: Taking Imodium Chemo-induced anemia: Monitoring Hypomagnesemia: Oral magnesium  supplementation   CT CAP 09/20/2023: Decrease in the size of the anterior mediastinal mass that effaces the heart and invades the sternum/chest wall, decreased size of the pleural-based left lower lobe lung nodule.  Scattered pulmonary nodules not visualized.  Decreased hepatic metastases.  Stable T1    Based on above CT scans patient is responding to treatment and therefore we will continue with the same. We reduced the dosage of Trodelvy  to 7.5 mg/kg because she is having profound nausea and diarrhea   Chronic Cough Multiple episodes of coughing, possibly related to post-nasal drip. -Try Mucinex and daily Claritin  to manage symptoms.   Return to clinic day 1 day 8 for chemo and I will see her every 3 weeks. Our plan is to do 6 rounds of chemo and then obtain a PET CT scan.   Orders Placed This Encounter  Procedures   NM PET Image Restag (PS) Skull Base To Thigh    Standing Status:   Future    Expected Date:   11/29/2023    Expiration Date:   11/01/2024    If indicated for the ordered procedure, I authorize the administration of a radiopharmaceutical per Radiology protocol:   Yes    Is the patient pregnant?:   No    Preferred  imaging location?:   [facility]    Release to patient:   Immediate   The patient has a good understanding of the overall plan. she agrees with it. she will call with any problems that may develop before the next visit here.  Total time spent: 30 mins including face to face time and time spent for planning, charting and co-ordination of care   Jessica MARLA Chad, MD 11/02/23

## 2023-11-02 NOTE — Progress Notes (Signed)
 OK to proceed with pending phosphorus labs per Dr. Pamelia Hoit.  Patient declined 30 min post [place] observation period.  Tolerated treatment well without incident.  VSS at discharge.  Ambulated to lobby.

## 2023-11-04 ENCOUNTER — Inpatient Hospital Stay: Payer: 59

## 2023-11-07 ENCOUNTER — Inpatient Hospital Stay: Payer: 59

## 2023-11-07 ENCOUNTER — Inpatient Hospital Stay: Payer: 59 | Admitting: Hematology and Oncology

## 2023-11-07 ENCOUNTER — Inpatient Hospital Stay: Payer: 59 | Admitting: Nurse Practitioner

## 2023-11-07 NOTE — Progress Notes (Deleted)
 Palliative Medicine [facility]  [phone] [phone]) [phone]   Name: Jessica Simpson Date: 11/07/2023 MRN: [id number]  DOB: 09-Jan-1971  Patient Care Team: [name], MD as PCP - General (Family Medicine) Odean Potts, MD as Consulting Physician (Hematology and Oncology) Pickenpack-Cousar, Fannie SAILOR, NP as Nurse Practitioner [facility] and Palliative Medicine)   I connected with Jessica Simpson on 11/07/23 at 12:30 PM EST by phone and verified that I am speaking with the correct person using two identifiers.   I discussed the limitations, risks, security and privacy concerns of performing an evaluation and management service by telemedicine and the availability of in-person appointments. I also discussed with the patient that there may be a patient responsible charge related to this service. The patient expressed understanding and agreed to proceed.   Other persons participating in the visit and their role in the encounter: n/a   Patient's location: home  Provider's location: [facility]   Chief Complaint: f/u of symptom management    INTERVAL HISTORY: Jessica Simpson is a 53 y.o. 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  Palliative ask to see for symptom management and goals of care.  SOCIAL HISTORY:     reports that she has never smoked. She has never used smokeless tobacco. She reports that she does not drink alcohol  and does not use drugs.  ADVANCE DIRECTIVES:  None on file  CODE STATUS: Full code  PAST MEDICAL HISTORY: Past Medical History:  Diagnosis Date   BRCA1 negative 10/22/2011   BRCA2 negative 10/22/2011   Breast cancer, IDC, Left, Stage III, Triple negative 06/30/2011   Breast disorder    Contraceptive education 01/15/2016   Cough    Dyspnea    Family history of breast cancer    Fracture    right 4th toe    History of breast cancer 07/22/2014   History of radiation therapy 05/16/18- 06/22/18   Right Breast/ 50.4 Gy in 28 fractions. Right posterior axilla and SCV nodes/ 50.4 Gy in 28 fractions.    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 She then received a boost to the left lumpectomy of 10 gray in 5 fractions. This was given at [facility] [place] .   Sleep apnea    Status post chemotherapy Comp. 11/23/11   FEC and Taxotere     ALLERGIES:  is allergic to codeine and hydrocodone-acetaminophen .  MEDICATIONS:  Current Outpatient Medications  Medication Sig Dispense Refill   acetaminophen  (TYLENOL ) 500 MG tablet Take 500 mg by mouth every 6 (six) hours as needed. For pain (Patient not taking: Reported on 10/14/2023)     amLODipine  (NORVASC ) 5 MG tablet Take 10 mg by mouth daily.  6   Cyanocobalamin  (VITAMIN B-12) 5000 MCG TBDP Take 5,000 mcg by mouth daily.     ibuprofen  (ADVIL ) 800 MG tablet Take 1 tablet (800 mg total) by mouth every 8 (eight) hours  as needed. (Patient not taking: Reported on 10/14/2023) 30 tablet 0   lidocaine -prilocaine  (EMLA ) cream Apply to affected area once 30 g 3   loratadine  (CLARITIN ) 10 MG tablet Take 10 mg by mouth daily as needed for allergies.     magnesium  oxide (MAG-OX) 400 (240 Mg) MG tablet Take 1 tablet (400 mg total) by mouth daily. 30 tablet 1   metoprolol  succinate (TOPROL  XL) 25 MG 24 hr tablet Take 1 tablet (25 mg total) by mouth daily. 90 tablet 3   Omega-3 Fatty  Acids (FISH OIL MAXIMUM STRENGTH) 1200 MG CPDR Take 1,200 mg by mouth daily.     ondansetron  (ZOFRAN ) 8 MG tablet Take 1 tablet (8 mg total) by mouth 2 (two) times daily. 60 tablet 6   prochlorperazine  (COMPAZINE ) 10 MG tablet Take 1 tablet (10 mg total) by mouth every 6 (six) hours as needed for nausea or vomiting. (Patient not taking: Reported on 10/14/2023) 30 tablet 6   Vitamin D , Ergocalciferol , (DRISDOL ) 1.25 MG (50000 UNIT) CAPS capsule Take 1 capsule (50,000 Units total) by mouth once a week. 12 capsule 2   No current facility-administered medications for this visit.    VITAL SIGNS: LMP 09/19/2018  There were no vitals filed for this visit.  Estimated body mass index is 31.26 kg/m as calculated from the following:   Height as of 11/02/23: 5' 6 (1.676 m).   Weight as of 11/02/23: 193 lb 11.2 oz (87.9 kg).   PERFORMANCE STATUS (ECOG) : 1 - Symptomatic but completely ambulatory   Physical Exam General: NAD Cardiovascular: regular rate and rhythm Pulmonary: clear ant fields Abdomen: soft, nontender, + bowel sounds Extremities: no edema, no joint deformities Skin: no rashes Neurological:   IMPRESSION: ***  We discussed Her current illness and what it means in the larger context of Her on-going co-morbidities. Natural disease trajectory and expectations were discussed.  I discussed the importance of continued conversation with family and their medical providers regarding overall plan of care and treatment options, ensuring decisions are within the context of the patients values and GOCs.  PLAN: Established therapeutic relationship. Education provided on palliative's role in collaboration with their Oncology/Radiation team.  Metastatic Breast Cancer Third recurrence with significant fatigue, managed nausea/vomiting, and intermittent diarrhea. Weight loss of 20+ pounds since May/August. -Continue current antiemetic and antidiarrheal medications per Oncology -Consider B12  injection during treatment or at home if not possible during treatment. Will evaluate B12 levels. -Introduce protein supplements such as Premier Protein shakes, starting with one a day and potentially increasing to two a day. -Continue Vitamin D  and reintroduce Omega-3 supplements. -Check in a week to assess effectiveness of new interventions.  Emotional distress Expressed difficulty coping with persistent illness and impact on quality of life. -Offered emotional support and open communication. -Discussed potential need for medication to manage anxiety, particularly during holiday season.  Follow-up in 4-6 weeks or sooner if any new symptoms or concerns arise.   Patient expressed understanding and was in agreement with this plan. She also understands that She can call the clinic at any time with any questions, concerns, or complaints.   Any controlled substances utilized were prescribed in the context of palliative care. PDMP has been reviewed.    Visit consisted of counseling and education dealing with the complex and emotionally intense issues of symptom management and palliative care in the setting of serious and potentially life-threatening illness.  Levon Borer, AGPCNP-BC  Palliative Medicine Team/[facility]

## 2023-11-08 ENCOUNTER — Encounter: Payer: Self-pay | Admitting: Hematology and Oncology

## 2023-11-10 ENCOUNTER — Encounter: Payer: Self-pay | Admitting: Hematology and Oncology

## 2023-11-10 NOTE — Progress Notes (Signed)
 error

## 2023-11-14 ENCOUNTER — Inpatient Hospital Stay: Payer: Medicare Other

## 2023-11-14 MED FILL — Fosaprepitant Dimeglumine For IV Infusion 150 MG (Base Eq): INTRAVENOUS | Qty: 5 | Status: AC

## 2023-11-14 NOTE — Progress Notes (Deleted)
Palliative Medicine [facility]  [phone] [phone]   Name: Marianny Mcfeely Date: 11/14/2023 MRN: [id number]  DOB: 03-24-1971  Patient Care Team: Arlina Robes, MD as PCP - General (Family Medicine) Serena Croissant, MD as Consulting Physician (Hematology and Oncology) Pickenpack-Cousar, Arty Baumgartner, NP as Nurse Practitioner (Hospice and Palliative Medicine)    REASON FOR CONSULTATION: Delicia Lamp is a 53 y.o. 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  Palliative ask to see for symptom management and goals of care.    SOCIAL HISTORY:     reports that she has never smoked. She has never used smokeless tobacco. She reports that she does not drink alcohol and does not use drugs.  ADVANCE DIRECTIVES:  None on file  CODE STATUS: Full code  PAST MEDICAL HISTORY: Past Medical History:  Diagnosis Date  . BRCA1 negative 10/22/2011  . BRCA2 negative 10/22/2011  . Breast cancer, IDC, Left, Stage III, Triple negative 06/30/2011  . Breast disorder   . Contraceptive education 01/15/2016  . Cough   . Dyspnea   . Family history of breast cancer   . Fracture    right 4th toe  . History of breast cancer 07/22/2014  . History of radiation therapy 05/16/18- 06/22/18   Right Breast/ 50.4 Gy in 28 fractions. Right posterior axilla and SCV nodes/ 50.4 Gy in 28 fractions.   . Hypertension   . Lymphedema 06/15/2012  . Lymphedema of arm   . Papanicolaou smear of cervix with positive high risk human papilloma virus (HPV) test 12/25/2020   12/19/2020 repeat pap in 1 year per ASCCP guidelines, 5 year CIN3+risk is 2.25%  . Personal history of chemotherapy   . Personal history of radiation therapy   . Positive fecal occult blood test 07/22/2014  . Presence of permanent cardiac pacemaker   . S/P radiation therapy 2013   50 gray in 25 fractions to the  left breast, supraclavicular, and axillary regions. She then received a boost to the left lumpectomy of 10 gray in 5 fractions. This was given at [facility].  . Sleep apnea   . Status post chemotherapy Comp. 11/23/11   FEC and Taxotere    PAST SURGICAL HISTORY:  Past Surgical History:  Procedure Laterality Date  . 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 AXILLARY LYMPH NODE BIOPSY Right 04/06/2018  . AXILLARY LYMPH NODE DISSECTION Right 04/06/2018   Procedure: RIGHT AXILLARY LYMPH NODE DISSECTION;  Surgeon: Emelia Loron, MD;  Location: [facility] OR;  Service: General;  Laterality: Right;  . BREAST BIOPSY Left 2012  . BREAST BIOPSY Right 2019  . BREAST LUMPECTOMY Left 2012  . BREAST LUMPECTOMY Right 2019   rt axilla  . BREAST SURGERY    . CARDIAC ELECTROPHYSIOLOGY MAPPING AND ABLATION    . CESAREAN SECTION    . COLONOSCOPY N/A 08/14/2014   Procedure: COLONOSCOPY;  Surgeon: Malissa Hippo, MD;  Location: [facility]ITE;  Service: Endoscopy;  Laterality: N/A;  930  . EVACUATION BREAST HEMATOMA  12/21/2011   Procedure: EVACUATION HEMATOMA BREAST;  Surgeon: Almond Lint, MD;  Location: [facility] OR;  Service: General;  Laterality: Left;  . HAND SURGERY     tumor on finger, left hand  . INCISION AND DRAINAGE ABSCESS ANAL     x3  . IR IMAGING GUIDED PORT INSERTION  08/01/2023  . left  breast biopsy with axillary biopsy     core bx  . PORT-A-CATH REMOVAL  12/21/2011   Procedure: REMOVAL PORT-A-CATH;  Surgeon: Currie Paris, MD;  Location: [facility];  Service: General;  Laterality: N/A;  . PORTACATH PLACEMENT  08/02/2011   dr Jamey Ripa  . PORTACATH PLACEMENT Right 11/14/2017   Procedure: INSERTION PORT-A-CATH WITH [place];  Surgeon: Emelia Loron, MD;  Location: [facility] OR;  Service: General;  Laterality: Right;  . removal breast mass  2004   benign  . TREATMENT FISTULA ANAL    . TUMOR REMOVAL     on left hand  . TUMOR  REMOVAL     rt. eye    HEMATOLOGY/ONCOLOGY HISTORY:  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)  06/30/2011 Initial Diagnosis   Breast cancer, IDC, Left, Stage III, Triple negative, 7.6 cm breast mass and palpable axillary mass   09/06/2011 Miscellaneous   BRCA 1 and 2: Negative   09/14/2011 - 11/23/2011 Neo-Adjuvant Chemotherapy   Dose dense FEC followed by dose dense Taxotere   12/21/2011 Surgery   Left lumpectomy: High-grade poorly differentiated IDC 1.7 cm with high-grade DCIS, margins negative, 3/7 lymph nodes positive, ER 0%, PR 0%, HER-2 negative ratio 1.33 T1CN1 stage IIb   12/31/2011 - 02/15/2012 Radiation Therapy   Radiation at [facility]   09/26/2017 Relapse/Recurrence   Left breast upper outer quadrant within the lumpectomy bed: Fibrosis no malignancy, right axillary lymph node biopsy metastatic high-grade carcinoma ER 0%, PR 0%, Ki-67 90%, HER-2 negative ratio 1.11 (similar to previous ductal carcinoma)   11/14/2017 - 03/06/2018 Neo-Adjuvant Chemotherapy   Neo-Adjuvant chemotherapy with Gemzar and carboplatin days 1 and 8 every 3 weeks   01/10/2018 Genetic Testing   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  The Hereditary Gene Panel offered by [facility] includes sequencing and/or deletion duplication testing of the following 47 genes: APC, ATM, AXIN2, BARD1, BMPR1A, BRCA1, BRCA2, BRIP1, CDH1, CDK4, CDKN2A (p14ARF), CDKN2A (p16INK4a), CHEK2, CTNNA1, DICER1, EPCAM (Deletion/duplication testing only), GREM1 (promoter region deletion/duplication testing only), KIT, MEN1, MLH1, MSH2, MSH3, MSH6, MUTYH, NBN, NF1, NHTL1, PALB2, PDGFRA, PMS2, POLD1, POLE, PTEN, RAD50, RAD51C, RAD51D, SDHB, SDHC, SDHD, SMAD4, SMARCA4. STK11, TP53, TSC1, TSC2, and VHL.  The following genes were evaluated for sequence changes only: SDHA and HOXB13 c.251G>A variant only. The report date is January 10, 2018.    05/31/2023 Procedure   Mediastinal  mass biopsy: Metastatic poorly differentiated carcinoma compatible with breast primary ER +60%, PR 0%, HER2 0, Ki-67 40%    06/02/2023 PET scan   PET/CT Anterior mediastinal mass is hypermetabolic and centrally necrotic, soft tissue lesions anterior to the heart, 10 mm pleural-based lesion, level 2 cervical nodes, hypermetabolism hepatic dome mottled FDG accumulation throughout the skeletal system    06/06/2023 - 07/14/2023 Anti-estrogen oral therapy   Antiestrogen therapy with Verzenio and letrozole    07/07/2023 Relapse/Recurrence   Increasing mediastinal mass 11.6 cm (used to be 10.8 cm) effacing the heart and involving the sternum, additional mediastinal/pericardial lymph nodes increased right extra lymph node 1.5 cm, nodules 0.2 cm (used to be 0.8 cm), few new scattered lung nodules)   07/25/2023 -  Chemotherapy   Patient is on Treatment Plan : BREAST METASTATIC Sacituzumab govitecan-hziy [name]) D1,8 q21d     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)  04/06/2018 Surgery   Right axillary lymph node dissection: 1/11 node positive for metastatic high-grade carcinoma   04/25/2018 Initial Diagnosis  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  MEDICATIONS:  Current Outpatient Medications  Medication Sig Dispense Refill  . acetaminophen (TYLENOL) 500 MG tablet Take 500 mg by mouth every 6 (six) hours as needed. For pain (Patient not taking: Reported on 10/14/2023)    . amLODipine (NORVASC) 5 MG tablet Take 10 mg by mouth daily.  6  . Cyanocobalamin (VITAMIN B-12) 5000 MCG TBDP  Take 5,000 mcg by mouth daily.    Marland Kitchen ibuprofen (ADVIL) 800 MG tablet Take 1 tablet (800 mg total) by mouth every 8 (eight) hours as needed. (Patient not taking: Reported on 10/14/2023) 30 tablet 0  . lidocaine-prilocaine (EMLA) cream Apply to affected area once 30 g 3  . loratadine (CLARITIN) 10 MG tablet Take 10 mg by mouth daily as needed for allergies.    . magnesium oxide (MAG-OX) 400 (240 Mg) MG tablet Take 1 tablet (400 mg total) by mouth daily. 30 tablet 1  . metoprolol succinate (TOPROL XL) 25 MG 24 hr tablet Take 1 tablet (25 mg total) by mouth daily. 90 tablet 3  . Omega-3 Fatty Acids (FISH OIL MAXIMUM STRENGTH) 1200 MG CPDR Take 1,200 mg by mouth daily.    . ondansetron (ZOFRAN) 8 MG tablet Take 1 tablet (8 mg total) by mouth 2 (two) times daily. 60 tablet 6  . prochlorperazine (COMPAZINE) 10 MG tablet Take 1 tablet (10 mg total) by mouth every 6 (six) hours as needed for nausea or vomiting. (Patient not taking: Reported on 10/14/2023) 30 tablet 6  . Vitamin D, Ergocalciferol, (DRISDOL) 1.25 MG (50000 UNIT) CAPS capsule Take 1 capsule (50,000 Units total) by mouth once a week. 12 capsule 2   No current facility-administered medications for this visit.    VITAL SIGNS: LMP 09/19/2018  There were no vitals filed for this visit.  Estimated body mass index is 31.26 kg/m as calculated from the following:   Height as of 11/02/23: 5\' 6"  (1.676 m).   Weight as of 11/02/23: 193 lb 11.2 oz (87.9 kg).  LABS: CBC:    Component Value Date/Time   WBC 3.4 (L) 11/02/2023 0746   WBC 5.1 05/31/2023 0831   HGB 9.8 (L) 11/02/2023 0746   HGB 12.2 07/26/2016 1650   HCT 31.5 (L) 11/02/2023 0746   HCT 37.5 07/26/2016 1650   PLT 312 11/02/2023 0746   PLT 294 07/26/2016 1650   MCV 87.7 11/02/2023 0746   MCV 80 07/26/2016 1650   NEUTROABS 2.5 11/02/2023 0746   LYMPHSABS 0.4 (L) 11/02/2023 0746   MONOABS 0.3 11/02/2023 0746   EOSABS 0.1 11/02/2023 0746   BASOSABS 0.0 11/02/2023 0746    Comprehensive Metabolic Panel:    Component Value Date/Time   NA 139 11/02/2023 0746   NA 140 07/26/2016 1650   K 3.2 (L) 11/02/2023 0746   CL 102 11/02/2023 0746   CO2 28 11/02/2023 0746   BUN 18 11/02/2023 0746   BUN 13 07/26/2016 1650   CREATININE 1.11 (H) 11/02/2023 0746   GLUCOSE 114 (H) 11/02/2023 0746   CALCIUM 9.3 11/02/2023 0746   AST  16 11/02/2023 0746   ALT 16 11/02/2023 0746   ALKPHOS 83 11/02/2023 0746   BILITOT 0.5 11/02/2023 0746   PROT 7.2 11/02/2023 0746   PROT 7.4 07/26/2016 1650   ALBUMIN 3.7 11/02/2023 0746   ALBUMIN 4.2 07/26/2016 1650    RADIOGRAPHIC STUDIES:  CT CHEST ABDOMEN PELVIS W CONTRAST  Result Date: 09/20/2023 CLINICAL DATA:  History of invasive breast cancer, follow-up. * Tracking Code: BO * EXAM: CT CHEST, ABDOMEN, AND PELVIS WITH CONTRAST TECHNIQUE: Multidetector CT imaging of the chest, abdomen and pelvis was performed following the standard protocol during bolus administration of intravenous contrast. RADIATION DOSE REDUCTION: This exam was performed according to the departmental dose-optimization program which includes automated exposure control, adjustment of the mA and/or kV according to patient size and/or use of iterative reconstruction technique. CONTRAST:  OMNIPAQUE IOHEXOL 300 MG/ML  SOLN COMPARISON:  Multiple priors including most recent CT July 07, 2023 FINDINGS: CT CHEST FINDINGS Cardiovascular: Right chest Port-A-Cath with tip in the SVC. Left chest cardiac pacemaker with unchanged position of the leads. Aortic atherosclerosis. No central pulmonary embolus on this nondedicated study. Normal size heart. Similar trace pericardial effusion. Mediastinum/Nodes: Decreased size of the large invasive anterior mediastinal mass which effaces the heart and invades the sternum/chest wall now measuring 7.9 x 5.9 cm on image 22/2 previously 11.6 x 7.6 cm. Additional mediastinal/pericardial and right axillary nodes have decreased in size.  For reference: -pericardial lymph node measures 13 mm in short axis on image 38/2 previously 2 cm. -right axillary lymph node measures 9 mm in short axis on image 16/2 previously 15 mm. Bilateral axillary surgical clips. Lungs/Pleura: Pleural-based left lower lobe pulmonary nodule measures 7 mm on image 117/7 previously 12 mm. Decreased subtle pleural nodularity along the left lower lateral aspect for instance on image 109/7. Scattered pulmonary nodules which were new on prior examination are not seen on today's study. New patchy ground-glass opacities predominantly within the left lung. Musculoskeletal: Stable sclerotic focus in the T1 vertebral body. Similar osseous involvement of the sternum by the anterior mediastinal mass. No new suspicious osseous lesions identified. CT ABDOMEN PELVIS FINDINGS Hepatobiliary: Decreased size of the segment IV hepatic hypodensity now measuring 16 mm on image 39/2 previously 19 mm. Progressive hypodensity along the falciform ligament commonly reflecting focal fatty infiltration/differential perfusion but warranting attention on follow-up imaging. No new suspicious hepatic lesion. Gallbladder is unremarkable.  No biliary ductal dilation. Pancreas: No pancreatic ductal dilation or evidence of acute inflammation. Spleen: No splenomegaly. Adrenals/Urinary Tract: Bilateral adrenal glands appear normal. No hydronephrosis. Kidneys demonstrate symmetric enhancement. Urinary bladder is unremarkable for degree of distension. Stomach/Bowel: Stomach is unremarkable for degree of distension. No pathologic dilation of small or large bowel. No evidence of acute bowel inflammation. Vascular/Lymphatic: Scattered aortic atherosclerosis. Smooth IVC contours. Mixing artifact in the IVC. Normal caliber abdominal aorta. The portal, splenic and superior mesenteric veins are patent. No pathologically enlarged abdominal or pelvic lymph nodes. Reproductive: Uterus and bilateral adnexa are unremarkable.  Other: No significant abdominopelvic free fluid. Pelvic floor laxity. Musculoskeletal: No aggressive lytic or blastic lesion of bone. IMPRESSION: 1. Decreased size of the large invasive anterior mediastinal mass which effaces the heart and invades the sternum/chest wall. 2. Decreased size of the pleural-based left lower lobe pulmonary nodule and subtle pleural nodularity along the left lower lateral aspect. 3. Scattered pulmonary nodules which were new on prior examination are not seen on today's study. 4. Decreased size of the segment IV hepatic hypodensity. 5. Stable sclerotic focus  in the T1 vertebral body. No new suspicious osseous lesions identified. 6. New patchy ground-glass opacities predominantly within the left lung. 7. Aortic Atherosclerosis (ICD10-I70.0). Electronically Signed   By: Maudry Mayhew M.D.   On: 09/20/2023 11:54    PERFORMANCE STATUS (ECOG) : 1 - Symptomatic but completely ambulatory  Review of Systems  Constitutional:  Positive for activity change, appetite change and fatigue.  Unless otherwise noted, a complete review of systems is negative.  Physical Exam General: NAD Cardiovascular: regular rate and rhythm Pulmonary: clear ant fields Abdomen: soft, nontender, + bowel sounds Extremities: no edema, no joint deformities Skin: no rashes Neurological: Alert and oriented x3  Discussed the use of AI scribe software for clinical note transcription with the patient, who gave verbal consent to proceed.  IMPRESSION:  This is my initial visit with Ms. Roxan Hockey. Her Neysa Bonito, Nuala Alpha is present. Patient is ambulatory without assistance. Alert and able to engage appropriately in discussions.   I introduced myself, Retia Cordle RN, and Palliative's role in collaboration with the oncology team. Concept of Palliative Care was introduced as specialized medical care for people and their families living with serious illness. It focuses on providing relief from the symptoms and stress of a  serious illness. The goal is to improve quality of life for both the patient and the family. Values and goals of care important to patient and family were attempted to be elicited.   Ms. Dubose lives in the home with her Neysa Bonito. She has one daughter. Patient states she is currently not working.  At home she is able to perform all ADLs independently however with some limitations due to fatigue. Patient shares this is her third recurrence of her breast cancer.   Ms. Yasui's primary concern at this time is persistent fatigue, described as a constant struggle and more severe than previous episodes. The patient reports that all activities have become increasingly difficult due to the fatigue. The patient's energy levels have significantly declined since the initiation of chemotherapy. She reports that the fatigue is particularly severe in the days following treatment, often lasting until the following week. Even during her week off from treatment, the patient struggles with fatigue. This has significantly impacted her quality of life, with most days spent at home with minimal activity due to lack of energy spending most of her days in the chair watching television. She does go out to run errands however with little energy or enjoyment due to her fatigue. Months prior Jazman would describe herself as active and energetic "always on the go!""   Extensive medication review completed. She is actively taking Vitamin D and Vitamin B12 daily. Encouraged patient to continue with daily regimen. She is taking Omega 3 however not consistently. Recommended restarting. We discussed evaluating B12 levels.   Other symptoms include intermittent vomiting and diarrhea, which are currently managed with medication. The patient has also experienced significant weight loss of over twenty pounds since May. She reports an inconsistent appetite and sleep disturbances, waking frequently during the night, a pattern she notes  has been consistent for years. Her appetite fluctuates. Some days are better than others. Current weight today is 204lbs, 209lbs on 11/18, 212lbs on 10/21, 223lbs 8/12. I recommended protein shakes for additional nutritional support which can also aid in improvement in fatigue in addition to appetite and ongoing weight loss.   We discussed her current illness and what it means in the larger context of her on-going co-morbidities. Natural disease trajectory and expectations were discussed.  Ms. Miland is realistic in her understanding. She reports a recent emotional struggle, expressing frustration with being constantly unwell. She is taking things one day at a time remaining hopeful. We discussed ability to express feelings. She shares that she does have a good support system. We discussed use of medication in the event she feels that she may need something to help manage. Patient knows to contact the medical team.  I discussed the importance of continued conversation with family and their medical providers regarding overall plan of care and treatment options, ensuring decisions are within the context of the patients values and GOCs.  PLAN: Established therapeutic relationship. Education provided on palliative's role in collaboration with their Oncology/Radiation team.  Metastatic Breast Cancer Third recurrence with significant fatigue, managed nausea/vomiting, and intermittent diarrhea. Weight loss of 20+ pounds since May/August. -Continue current antiemetic and antidiarrheal medications per Oncology -Consider B12 injection during treatment or at home if not possible during treatment. Will evaluate B12 levels. -Introduce protein supplements such as Premier Protein shakes, starting with one a day and potentially increasing to two a day. -Continue Vitamin D and reintroduce Omega-3 supplements. -Check in a week to assess effectiveness of new interventions.  Emotional distress Expressed difficulty  coping with persistent illness and impact on quality of life. -Offered emotional support and open communication. -Discussed potential need for medication to manage anxiety, particularly during holiday season.  Follow-up in 4-6 weeks or sooner if any new symptoms or concerns arise.  Patient expressed understanding and was in agreement with this plan. She also understands that She can call the clinic at any time with any questions, concerns, or complaints.   Thank you for your referral and allowing Palliative to assist in Mrs. Tierica Wolske Livingstone's care.   Number and complexity of problems addressed: HIGH - 1 or more chronic illnesses with SEVERE exacerbation, progression, or side effects of treatment - advanced cancer, pain. Any controlled substances utilized were prescribed in the context of palliative care.   Visit consisted of counseling and education dealing with the complex and emotionally intense issues of symptom management and palliative care in the setting of serious and potentially life-threatening illness.  Signed by: Willette Alma, AGPCNP-BC Palliative Medicine Team/[facility]   *Please note that this is a verbal dictation therefore any spelling or grammatical errors are due to the "[employer]agon [employer] One" system interpretation.

## 2023-11-15 ENCOUNTER — Encounter: Payer: Self-pay | Admitting: Adult Health

## 2023-11-15 ENCOUNTER — Inpatient Hospital Stay: Payer: 59

## 2023-11-15 ENCOUNTER — Inpatient Hospital Stay: Payer: Medicare Other

## 2023-11-15 ENCOUNTER — Telehealth: Payer: Self-pay | Admitting: *Deleted

## 2023-11-15 ENCOUNTER — Inpatient Hospital Stay (HOSPITAL_BASED_OUTPATIENT_CLINIC_OR_DEPARTMENT_OTHER): Payer: Medicare Other | Admitting: Adult Health

## 2023-11-15 ENCOUNTER — Inpatient Hospital Stay: Payer: Medicare Other | Admitting: Nurse Practitioner

## 2023-11-15 VITALS — BP 140/81 | HR 115 | Temp 97.7°F | Resp 18 | Ht 66.0 in | Wt 194.8 lb

## 2023-11-15 DIAGNOSIS — R197 Diarrhea, unspecified: Secondary | ICD-10-CM

## 2023-11-15 DIAGNOSIS — A6920 Lyme disease, unspecified: Secondary | ICD-10-CM | POA: Insufficient documentation

## 2023-11-15 DIAGNOSIS — C771 Secondary and unspecified malignant neoplasm of intrathoracic lymph nodes: Secondary | ICD-10-CM

## 2023-11-15 DIAGNOSIS — N6331 Unspecified lump in axillary tail of the right breast: Secondary | ICD-10-CM

## 2023-11-15 DIAGNOSIS — I4439 Other atrioventricular block: Secondary | ICD-10-CM | POA: Insufficient documentation

## 2023-11-15 DIAGNOSIS — C50919 Malignant neoplasm of unspecified site of unspecified female breast: Secondary | ICD-10-CM

## 2023-11-15 DIAGNOSIS — C50012 Malignant neoplasm of nipple and areola, left female breast: Secondary | ICD-10-CM | POA: Diagnosis not present

## 2023-11-15 DIAGNOSIS — G4733 Obstructive sleep apnea (adult) (pediatric): Secondary | ICD-10-CM | POA: Insufficient documentation

## 2023-11-15 DIAGNOSIS — Z95828 Presence of other vascular implants and grafts: Secondary | ICD-10-CM

## 2023-11-15 LAB — CBC WITH DIFFERENTIAL (CANCER CENTER ONLY)
Abs Immature Granulocytes: 0.03 10*3/uL (ref 0.00–0.07)
Basophils Absolute: 0 10*3/uL (ref 0.0–0.1)
Basophils Relative: 0 %
Eosinophils Absolute: 0.1 10*3/uL (ref 0.0–0.5)
Eosinophils Relative: 1 %
HCT: 35.7 % — ABNORMAL LOW (ref 36.0–46.0)
Hemoglobin: 11.3 g/dL — ABNORMAL LOW (ref 12.0–15.0)
Immature Granulocytes: 1 %
Lymphocytes Relative: 8 %
Lymphs Abs: 0.4 10*3/uL — ABNORMAL LOW (ref 0.7–4.0)
MCH: 28.3 pg (ref 26.0–34.0)
MCHC: 31.7 g/dL (ref 30.0–36.0)
MCV: 89.3 fL (ref 80.0–100.0)
Monocytes Absolute: 0.3 10*3/uL (ref 0.1–1.0)
Monocytes Relative: 7 %
Neutro Abs: 3.9 10*3/uL (ref 1.7–7.7)
Neutrophils Relative %: 83 %
Platelet Count: 283 10*3/uL (ref 150–400)
RBC: 4 MIL/uL (ref 3.87–5.11)
RDW: 18.7 % — ABNORMAL HIGH (ref 11.5–15.5)
WBC Count: 4.8 10*3/uL (ref 4.0–10.5)
nRBC: 0 % (ref 0.0–0.2)

## 2023-11-15 LAB — CMP (CANCER CENTER ONLY)
ALT: 11 U/L (ref 0–44)
AST: 13 U/L — ABNORMAL LOW (ref 15–41)
Albumin: 3.9 g/dL (ref 3.5–5.0)
Alkaline Phosphatase: 80 U/L (ref 38–126)
Anion gap: 8 (ref 5–15)
BUN: 11 mg/dL (ref 6–20)
CO2: 28 mmol/L (ref 22–32)
Calcium: 9.4 mg/dL (ref 8.9–10.3)
Chloride: 104 mmol/L (ref 98–111)
Creatinine: 0.88 mg/dL (ref 0.44–1.00)
GFR, Estimated: >60 mL/min (ref >60)
Glucose, Bld: 112 mg/dL — ABNORMAL HIGH (ref 70–99)
Potassium: 3.5 mmol/L (ref 3.5–5.1)
Sodium: 140 mmol/L (ref 135–145)
Total Bilirubin: 0.4 mg/dL (ref 0.0–1.2)
Total Protein: 7.1 g/dL (ref 6.5–8.1)

## 2023-11-15 LAB — PHOSPHORUS: Phosphorus: 3.8 mg/dL (ref 2.5–4.6)

## 2023-11-15 LAB — MAGNESIUM: Magnesium: 1.9 mg/dL (ref 1.7–2.4)

## 2023-11-15 MED ORDER — SODIUM CHLORIDE 0.9% FLUSH
10.0000 mL | Freq: Once | INTRAVENOUS | Status: AC
Start: 2023-11-15 — End: 2023-11-15
  Administered 2023-11-15: 10 mL via INTRAVENOUS

## 2023-11-15 MED ORDER — MAGNESIUM OXIDE -MG SUPPLEMENT 400 (240 MG) MG PO TABS: 400.0000 mg | ORAL_TABLET | Freq: Every day | ORAL | 1 refills | Status: DC

## 2023-11-15 MED ORDER — HEPARIN SOD (PORK) LOCK FLUSH 100 UNIT/ML IV SOLN
500.0000 [IU] | Freq: Once | INTRAVENOUS | Status: AC
Start: 2023-11-15 — End: 2023-11-15
  Administered 2023-11-15: 500 [IU]

## 2023-11-15 MED ORDER — LIDOCAINE-PRILOCAINE 2.5-2.5 % EX CREA: TOPICAL_CREAM | CUTANEOUS | 3 refills | Status: DC

## 2023-11-15 MED ORDER — SODIUM CHLORIDE 0.9% FLUSH
10.0000 mL | Freq: Once | INTRAVENOUS | Status: AC
Start: 2023-11-15 — End: 2023-11-15
  Administered 2023-11-15: 10 mL

## 2023-11-15 NOTE — Telephone Encounter (Signed)
This RN spoke with pt in lobby per need to obtain stool for C Diff as well as arrange for bed for infusion until stool resulted per policy.  Pt will come back tomorrow for infusion as well as C Diff given with instructions.  Charge nurse rescheduling pt.

## 2023-11-15 NOTE — Addendum Note (Signed)
Addended by: Henriette Combs E on: 11/15/2023 01:09 PM   Modules accepted: Orders

## 2023-11-15 NOTE — Progress Notes (Unsigned)
[facility] Cancer Follow up:    Jessica Robes, MD [address] 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 KI-67 of 99%    - Pathologic: No stage assigned - Unsigned Specimen type: Core Needle Biopsy Histopathologic type: Infiltrating duct carcinoma, NOS Tumor size (mm): 66 Prognostic indicators: Triple negative. 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)  06/30/2011 Initial Diagnosis   Breast cancer, IDC, Left, Stage III, Triple negative, 7.6 cm breast mass and palpable axillary mass   09/06/2011 Miscellaneous   BRCA 1 and 2: Negative   09/14/2011 - 11/23/2011 Neo-Adjuvant Chemotherapy   Dose dense FEC followed by dose dense Taxotere   12/21/2011 Surgery   Left lumpectomy: High-grade poorly differentiated IDC 1.7 cm with high-grade DCIS, margins negative, 3/7 lymph nodes positive, ER 0%, PR 0%, HER-2 negative ratio 1.33 T1CN1 stage IIb   12/31/2011 - 02/15/2012 Radiation Therapy   Radiation at [facility]   09/26/2017 Relapse/Recurrence   Left breast upper  outer quadrant within the lumpectomy bed: Fibrosis no malignancy, right axillary lymph node biopsy metastatic high-grade carcinoma ER 0%, PR 0%, Ki-67 90%, HER-2 negative ratio 1.11 (similar to previous ductal carcinoma)   11/14/2017 - 03/06/2018 Neo-Adjuvant Chemotherapy   Neo-Adjuvant chemotherapy with Gemzar and carboplatin days 1 and 8 every 3 weeks   01/10/2018 Genetic Testing   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  The Hereditary Gene Panel offered by [facility] includes sequencing and/or deletion duplication testing of the following 47 genes: APC, ATM, AXIN2, BARD1, BMPR1A, BRCA1, BRCA2, BRIP1, CDH1, CDK4, CDKN2A (p14ARF), CDKN2A (p16INK4a), CHEK2, CTNNA1, DICER1, EPCAM (Deletion/duplication testing only), GREM1 (promoter region deletion/duplication testing only), KIT, MEN1, MLH1, MSH2, MSH3, MSH6, MUTYH, NBN, NF1, NHTL1, PALB2, PDGFRA, PMS2, POLD1, POLE, PTEN, RAD50, RAD51C, RAD51D, SDHB, SDHC, SDHD, SMAD4, SMARCA4. STK11, TP53, TSC1, TSC2, and VHL.  The following genes were evaluated for sequence changes only: SDHA and HOXB13 c.251G>A variant only. The report date is January 10, 2018.    05/31/2023 Procedure   Mediastinal mass biopsy: Metastatic poorly differentiated carcinoma compatible with breast primary ER +60%, PR 0%, HER2 0, Ki-67 40%    06/02/2023 PET scan   PET/CT Anterior mediastinal mass is hypermetabolic and centrally necrotic, soft tissue lesions anterior to the heart, 10 mm pleural-based lesion, level 2 cervical nodes, hypermetabolism hepatic dome mottled FDG accumulation throughout the skeletal system    06/06/2023 - 07/14/2023 Anti-estrogen oral therapy   Antiestrogen therapy with Verzenio and letrozole    07/07/2023 Relapse/Recurrence   Increasing mediastinal mass 11.6 cm (used to be 10.8 cm) effacing the heart and involving the sternum, additional mediastinal/pericardial lymph nodes increased  right extra lymph node 1.5 cm, nodules 0.2 cm  (used to be 0.8 cm), few new scattered lung nodules)   07/25/2023 -  Chemotherapy   Patient is on Treatment Plan : BREAST METASTATIC Sacituzumab govitecan-hziy [name]) D1,8 q21d     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)  04/06/2018 Surgery   Right axillary lymph node dissection: 1/11 node positive for metastatic high-grade carcinoma   04/25/2018 Initial Diagnosis   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  Jessica Simpson 53 y.o. 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   High degree atrioventricular block 11/15/2023   Secondary malignancy of mediastinal lymph nodes (HCC) 07/20/2023   PMB (postmenopausal bleeding) 01/26/2022   Papanicolaou smear of cervix with positive high risk human papilloma virus (HPV) test 12/25/2020   Routine medical exam 12/19/2020   Encounter for screening fecal occult blood testing 12/19/2020   Hypertension 09/01/2018   Screening for colorectal cancer 09/01/2018   Encounter for gynecological examination with Papanicolaou smear of cervix 09/01/2018   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) 04/25/2018   Genetic testing 01/12/2018   Family history of breast cancer    Port-A-Cath in place 11/21/2017   Mass of axillary tail of right breast 08/24/2017   Pain with urination 08/24/2017   Urinary frequency 08/24/2017   Hematuria  08/24/2017   Encounter for well woman exam with routine gynecological exam 01/15/2016   Benign cyst of right breast 11/29/2015   Positive fecal occult blood test 07/22/2014   History of breast cancer 07/22/2014   Rectal bleeding 07/10/2014   Lymphedema 06/15/2012   BRCA1 negative 10/22/2011   BRCA2 negative 10/22/2011   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  MEDICAL HISTORY: Past Medical History:  Diagnosis Date   BRCA1 negative 10/22/2011   BRCA2 negative 10/22/2011   Breast cancer, IDC, Left, Stage III, Triple negative 06/30/2011   Breast disorder    Contraceptive education 01/15/2016   Cough    Dyspnea    Family history of breast cancer    Fracture    right 4th toe   History of breast cancer 07/22/2014   History of radiation therapy 05/16/18- 06/22/18   Right Breast/ 50.4 Gy in 28 fractions. Right posterior axilla and SCV nodes/ 50.4 Gy in 28 fractions.    Hypertension    Lymphedema 06/15/2012   Lymphedema of arm    Papanicolaou smear of cervix with positive high risk human papilloma virus (HPV) test 12/25/2020   12/19/2020 repeat pap in 1 year per ASCCP guidelines, 5 year CIN3+risk is 2.25%   Personal history of chemotherapy    Personal history of radiation therapy    Positive fecal occult blood test 07/22/2014   Presence of permanent cardiac  pacemaker    S/P radiation therapy 2013   50 gray in 25 fractions to the left breast, supraclavicular, and axillary regions. She then received a boost to the left lumpectomy of 10 gray in 5 fractions. This was given at [facility].   Sleep apnea    Status post chemotherapy Comp. 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   AXILLARY LYMPH NODE BIOPSY Right 04/06/2018    AXILLARY LYMPH NODE DISSECTION Right 04/06/2018   Procedure: RIGHT AXILLARY LYMPH NODE DISSECTION;  Surgeon: Emelia Loron, MD;  Location: [facility] OR;  Service: General;  Laterality: Right;   BREAST BIOPSY Left 2012   BREAST BIOPSY Right 2019   BREAST LUMPECTOMY Left 2012   BREAST LUMPECTOMY Right 2019   rt axilla   BREAST SURGERY     CARDIAC ELECTROPHYSIOLOGY MAPPING AND ABLATION     CESAREAN SECTION     COLONOSCOPY N/A 08/14/2014   Procedure: COLONOSCOPY;  Surgeon: Malissa Hippo, MD;  Location: [facility];  Service: Endoscopy;  Laterality: N/A;  930   EVACUATION BREAST HEMATOMA  12/21/2011   Procedure: EVACUATION HEMATOMA BREAST;  Surgeon: Almond Lint, MD;  Location: [facility] OR;  Service: General;  Laterality: Left;   HAND SURGERY     tumor on finger, left hand   INCISION AND DRAINAGE ABSCESS ANAL     x3   IR IMAGING GUIDED PORT INSERTION  08/01/2023   left breast biopsy with axillary biopsy     core bx   PORT-A-CATH REMOVAL  12/21/2011   Procedure: REMOVAL PORT-A-CATH;  Surgeon: Currie Paris, MD;  Location: [facility];  Service: General;  Laterality: N/A;   PORTACATH PLACEMENT  08/02/2011   dr Lenord Carbo PLACEMENT Right 11/14/2017   Procedure: INSERTION PORT-A-CATH WITH [place];  Surgeon: Emelia Loron, MD;  Location: [facility] OR;  Service: General;  Laterality: Right;   removal breast mass  2004   benign   TREATMENT FISTULA ANAL     TUMOR REMOVAL     on left hand   TUMOR REMOVAL     rt. eye    SOCIAL HISTORY: Social History   Socioeconomic History   Marital status: Single    Spouse name: Not on file   Number of children: Not on file   Years of education: Not on file   Highest education level: Not on file  Occupational History   Not on file  Tobacco Use   Smoking status: Never   Smokeless tobacco: Never  Vaping Use   Vaping status: Never Used  Substance and Sexual Activity   Alcohol use: No   Drug use: No   Sexual activity: Yes     Birth control/protection: Post-menopausal  Other Topics Concern   Not on file  Social History Narrative   Not on file   Social Drivers of Health   Financial Resource Strain: Low Risk  (01/26/2022)   Overall Financial Resource Strain (CARDIA)    Difficulty of Paying Living Expenses: Not very hard  Food Insecurity: No Food Insecurity (09/29/2023)   Hunger Vital Sign    Worried About Running Out of Food in the Last Year: Never true    Ran Out of Food in the Last Year: Never true  Transportation Needs: No Transportation Needs (09/29/2023)   PRAPARE - Transportation  Lack of Transportation (Medical): No    Lack of Transportation (Non-Medical): No  Physical Activity: Insufficiently Active (01/26/2022)   Exercise Vital Sign    Days of Exercise per Week: 3 days    Minutes of Exercise per Session: 30 min  Stress: No Stress Concern Present (01/26/2022)   [employer] of Occupational Health - Occupational Stress Questionnaire    Feeling of Stress : Only a little  Social Connections: Moderately Integrated (01/26/2022)   Social Connection and Isolation Panel [NHANES]    Frequency of Communication with Friends and Family: More than three times a week    Frequency of Social Gatherings with Friends and Family: Once a week    Attends Religious Services: More than 4 times per year    Active Member of Clubs or Organizations: Yes    Attends Banker Meetings: More than 4 times per year    Marital Status: Never married  Intimate Partner Violence: Not At Risk (09/29/2023)   Humiliation, Afraid, Rape, and Kick questionnaire    Fear of Current or Ex-Partner: No    Emotionally Abused: No    Physically Abused: No    Sexually Abused: No    FAMILY HISTORY: Family History  Problem Relation Age of Onset   Cancer Father 36       lung cancer    Cancer Maternal Grandmother        breast   Cancer Maternal Grandfather    Breast cancer Other        MGMs mother   Colon cancer Neg Hx      Review of Systems - Oncology    PHYSICAL EXAMINATION    Vitals:   11/15/23 1159  BP: (!) 140/81  Pulse: (!) 115  Resp: 18  Temp: 97.7 F (36.5 C)  SpO2: 98%    Physical Exam  LABORATORY DATA:  CBC    Component Value Date/Time   WBC 4.8 11/15/2023 1137   WBC 5.1 05/31/2023 0831   RBC 4.00 11/15/2023 1137   HGB 11.3 (L) 11/15/2023 1137   HGB 12.2 07/26/2016 1650   HCT 35.7 (L) 11/15/2023 1137   HCT 37.5 07/26/2016 1650   PLT 283 11/15/2023 1137   PLT 294 07/26/2016 1650   MCV 89.3 11/15/2023 1137   MCV 80 07/26/2016 1650   MCH 28.3 11/15/2023 1137   MCHC 31.7 11/15/2023 1137   RDW 18.7 (H) 11/15/2023 1137   RDW 14.0 07/26/2016 1650   LYMPHSABS 0.4 (L) 11/15/2023 1137   MONOABS 0.3 11/15/2023 1137   EOSABS 0.1 11/15/2023 1137   BASOSABS 0.0 11/15/2023 1137    CMP     Component Value Date/Time   NA 139 11/02/2023 0746   NA 140 07/26/2016 1650   K 3.2 (L) 11/02/2023 0746   CL 102 11/02/2023 0746   CO2 28 11/02/2023 0746   GLUCOSE 114 (H) 11/02/2023 0746   BUN 18 11/02/2023 0746   BUN 13 07/26/2016 1650   CREATININE 1.11 (H) 11/02/2023 0746   CALCIUM 9.3 11/02/2023 0746   PROT 7.2 11/02/2023 0746   PROT 7.4 07/26/2016 1650   ALBUMIN 3.7 11/02/2023 0746   ALBUMIN 4.2 07/26/2016 1650   AST 16 11/02/2023 0746   ALT 16 11/02/2023 0746   ALKPHOS 83 11/02/2023 0746   BILITOT 0.5 11/02/2023 0746   GFRNONAA 60 (L) 11/02/2023 0746   GFRAA >60 12/21/2018 1402   GFRAA >60 10/24/2018 1053       PENDING LABS:   RADIOGRAPHIC STUDIES:  No  results found.   PATHOLOGY:     ASSESSMENT and THERAPY PLAN:   No problem-specific Assessment & Plan notes found for this encounter.   No orders of the defined types were placed in this encounter.   All questions were answered. The patient knows to call the clinic with any problems, questions or concerns. We can certainly see the patient much sooner if necessary. This note was electronically  signed. Noreene Filbert, NP 11/15/2023

## 2023-11-16 ENCOUNTER — Encounter: Payer: Self-pay | Admitting: Hematology and Oncology

## 2023-11-16 ENCOUNTER — Inpatient Hospital Stay: Payer: Medicare Other

## 2023-11-16 VITALS — BP 122/84 | HR 100 | Temp 97.8°F | Resp 16

## 2023-11-16 DIAGNOSIS — R197 Diarrhea, unspecified: Secondary | ICD-10-CM

## 2023-11-16 DIAGNOSIS — C50919 Malignant neoplasm of unspecified site of unspecified female breast: Secondary | ICD-10-CM

## 2023-11-16 DIAGNOSIS — C50012 Malignant neoplasm of nipple and areola, left female breast: Secondary | ICD-10-CM | POA: Diagnosis not present

## 2023-11-16 DIAGNOSIS — N6331 Unspecified lump in axillary tail of the right breast: Secondary | ICD-10-CM

## 2023-11-16 LAB — C DIFFICILE QUICK SCREEN W PCR REFLEX
C Diff antigen: NEGATIVE
C Diff interpretation: NOT DETECTED
C Diff toxin: NEGATIVE

## 2023-11-16 MED ORDER — FAMOTIDINE IN NACL 20-0.9 MG/50ML-% IV SOLN
20.0000 mg | Freq: Once | INTRAVENOUS | Status: AC
  Administered 2023-11-16: 20 mg via INTRAVENOUS
  Filled 2023-11-16: qty 50

## 2023-11-16 MED ORDER — SODIUM CHLORIDE 0.9% FLUSH
10.0000 mL | INTRAVENOUS | Status: DC | PRN
  Administered 2023-11-16: 10 mL

## 2023-11-16 MED ORDER — DIPHENHYDRAMINE HCL 50 MG/ML IJ SOLN
50.0000 mg | Freq: Once | INTRAMUSCULAR | Status: AC
  Administered 2023-11-16: 50 mg via INTRAVENOUS
  Filled 2023-11-16: qty 1

## 2023-11-16 MED ORDER — PALONOSETRON HCL INJECTION 0.25 MG/5ML
0.2500 mg | Freq: Once | INTRAVENOUS | Status: AC
  Administered 2023-11-16: 0.25 mg via INTRAVENOUS
  Filled 2023-11-16: qty 5

## 2023-11-16 MED ORDER — SODIUM CHLORIDE 0.9 % IV SOLN
7.5000 mg/kg | Freq: Once | INTRAVENOUS | Status: AC
  Administered 2023-11-16: 720 mg via INTRAVENOUS
  Filled 2023-11-16: qty 72

## 2023-11-16 MED ORDER — HEPARIN SOD (PORK) LOCK FLUSH 100 UNIT/ML IV SOLN
500.0000 [IU] | Freq: Once | INTRAVENOUS | Status: AC | PRN
  Administered 2023-11-16: 500 [IU]

## 2023-11-16 MED ORDER — DEXAMETHASONE SODIUM PHOSPHATE 10 MG/ML IJ SOLN
10.0000 mg | Freq: Once | INTRAMUSCULAR | Status: AC
  Administered 2023-11-16: 10 mg via INTRAVENOUS
  Filled 2023-11-16: qty 1

## 2023-11-16 MED ORDER — ACETAMINOPHEN 325 MG PO TABS
650.0000 mg | ORAL_TABLET | Freq: Once | ORAL | Status: AC
  Administered 2023-11-16: 650 mg via ORAL
  Filled 2023-11-16: qty 2

## 2023-11-16 MED ORDER — ATROPINE SULFATE 1 MG/ML IV SOLN
0.5000 mg | Freq: Once | INTRAVENOUS | Status: AC | PRN
  Administered 2023-11-16: 0.5 mg via INTRAVENOUS
  Filled 2023-11-16: qty 1

## 2023-11-16 MED ORDER — SODIUM CHLORIDE 0.9 % IV SOLN
150.0000 mg | Freq: Once | INTRAVENOUS | Status: AC
  Administered 2023-11-16: 150 mg via INTRAVENOUS
  Filled 2023-11-16: qty 150

## 2023-11-16 MED ORDER — SODIUM CHLORIDE 0.9 % IV SOLN: Freq: Once | INTRAVENOUS | Status: AC

## 2023-11-16 NOTE — Assessment & Plan Note (Signed)
Prior treatment: Verzinio with letrozole started 06/08/2023-07/20/2023   CT CAP: Increasing mediastinal mass 11.6 cm (used to be 10.8 cm) effacing the heart and involving the sternum, additional mediastinal/pericardial lymph nodes increased right extra lymph node 1.5 cm, nodules 0.2 cm (used to be 0.8 cm), few new scattered lung nodules)  CT CAP 09/20/2023: Decrease in the size of the anterior mediastinal mass that effaces the heart and invades the sternum/chest wall, decreased size of the pleural-based left lower lobe lung nodule.  Scattered pulmonary nodules not visualized.  Decreased hepatic metastases.  Stable T1   Treatment plan: [name] started 07/25/2023, today is cycle 6    Chest discomfort: Secondary to malignancy.     [name] toxicities: Fatigue: multifactorial related to chemo and pacemaker  Hypomagnesemia: Oral magnesium supplementation  Diarrhea.  Likely secondary to treatment, however in setting of antibiotic use 4 weeks ago and worsening  -GI pathogen stool panel and c diff to r/o infectious colitis.  -continue imodium PRN Breast Swelling.   Worsening swelling, no signs of infection on physical exam.  -repeat PET scan to r/o disease progression -recommended tight fitting bra for support.   RTC in 1 week for day 8 trodelvy, PET scan on 1/31 and in 3 weeks for labs, f/u with Dr. Pamelia Hoit, and her next treatment.

## 2023-11-17 LAB — GASTROINTESTINAL PANEL BY PCR, STOOL (REPLACES STOOL CULTURE)

## 2023-11-18 ENCOUNTER — Inpatient Hospital Stay: Payer: Medicare Other

## 2023-11-18 VITALS — BP 115/75 | HR 103 | Resp 18

## 2023-11-18 DIAGNOSIS — N6331 Unspecified lump in axillary tail of the right breast: Secondary | ICD-10-CM

## 2023-11-18 DIAGNOSIS — C50012 Malignant neoplasm of nipple and areola, left female breast: Secondary | ICD-10-CM | POA: Diagnosis not present

## 2023-11-18 DIAGNOSIS — C50919 Malignant neoplasm of unspecified site of unspecified female breast: Secondary | ICD-10-CM

## 2023-11-18 MED ORDER — FILGRASTIM-SNDZ 480 MCG/0.8ML IJ SOSY
480.0000 ug | PREFILLED_SYRINGE | Freq: Once | INTRAMUSCULAR | Status: AC
  Administered 2023-11-18: 480 ug via SUBCUTANEOUS
  Filled 2023-11-18: qty 0.8

## 2023-11-21 MED FILL — Fosaprepitant Dimeglumine For IV Infusion 150 MG (Base Eq): INTRAVENOUS | Qty: 5 | Status: AC

## 2023-11-22 ENCOUNTER — Inpatient Hospital Stay: Payer: Medicare Other

## 2023-11-22 VITALS — BP 143/93 | HR 102 | Temp 98.8°F | Resp 16 | Wt 195.8 lb

## 2023-11-22 DIAGNOSIS — Z95828 Presence of other vascular implants and grafts: Secondary | ICD-10-CM

## 2023-11-22 DIAGNOSIS — N6331 Unspecified lump in axillary tail of the right breast: Secondary | ICD-10-CM

## 2023-11-22 DIAGNOSIS — C50919 Malignant neoplasm of unspecified site of unspecified female breast: Secondary | ICD-10-CM

## 2023-11-22 DIAGNOSIS — C50012 Malignant neoplasm of nipple and areola, left female breast: Secondary | ICD-10-CM | POA: Diagnosis not present

## 2023-11-22 LAB — CBC WITH DIFFERENTIAL (CANCER CENTER ONLY)
Abs Immature Granulocytes: 0.17 10*3/uL — ABNORMAL HIGH (ref 0.00–0.07)
Basophils Absolute: 0.1 10*3/uL (ref 0.0–0.1)
Basophils Relative: 1 %
Eosinophils Absolute: 0.1 10*3/uL (ref 0.0–0.5)
Eosinophils Relative: 3 %
HCT: 33.8 % — ABNORMAL LOW (ref 36.0–46.0)
Hemoglobin: 11 g/dL — ABNORMAL LOW (ref 12.0–15.0)
Immature Granulocytes: 4 %
Lymphocytes Relative: 11 %
Lymphs Abs: 0.5 10*3/uL — ABNORMAL LOW (ref 0.7–4.0)
MCH: 27.8 pg (ref 26.0–34.0)
MCHC: 32.5 g/dL (ref 30.0–36.0)
MCV: 85.4 fL (ref 80.0–100.0)
Monocytes Absolute: 0.5 10*3/uL (ref 0.1–1.0)
Monocytes Relative: 12 %
Neutro Abs: 2.9 10*3/uL (ref 1.7–7.7)
Neutrophils Relative %: 69 %
Platelet Count: 217 10*3/uL (ref 150–400)
RBC: 3.96 MIL/uL (ref 3.87–5.11)
RDW: 18.1 % — ABNORMAL HIGH (ref 11.5–15.5)
WBC Count: 4.2 10*3/uL (ref 4.0–10.5)
nRBC: 0 % (ref 0.0–0.2)

## 2023-11-22 LAB — CMP (CANCER CENTER ONLY)
ALT: 14 U/L (ref 0–44)
AST: 12 U/L — ABNORMAL LOW (ref 15–41)
Albumin: 3.9 g/dL (ref 3.5–5.0)
Alkaline Phosphatase: 85 U/L (ref 38–126)
Anion gap: 8 (ref 5–15)
BUN: 13 mg/dL (ref 6–20)
CO2: 28 mmol/L (ref 22–32)
Calcium: 9.4 mg/dL (ref 8.9–10.3)
Chloride: 101 mmol/L (ref 98–111)
Creatinine: 0.83 mg/dL (ref 0.44–1.00)
GFR, Estimated: >60 mL/min (ref >60)
Glucose, Bld: 111 mg/dL — ABNORMAL HIGH (ref 70–99)
Potassium: 3.5 mmol/L (ref 3.5–5.1)
Sodium: 137 mmol/L (ref 135–145)
Total Bilirubin: 0.4 mg/dL (ref 0.0–1.2)
Total Protein: 7.1 g/dL (ref 6.5–8.1)

## 2023-11-22 LAB — MAGNESIUM: Magnesium: 1.8 mg/dL (ref 1.7–2.4)

## 2023-11-22 LAB — PHOSPHORUS: Phosphorus: 3.9 mg/dL (ref 2.5–4.6)

## 2023-11-22 MED ORDER — DIPHENHYDRAMINE HCL 50 MG/ML IJ SOLN
50.0000 mg | Freq: Once | INTRAMUSCULAR | Status: AC
  Administered 2023-11-22: 50 mg via INTRAVENOUS
  Filled 2023-11-22: qty 1

## 2023-11-22 MED ORDER — SODIUM CHLORIDE 0.9 % IV SOLN
150.0000 mg | Freq: Once | INTRAVENOUS | Status: AC
  Administered 2023-11-22: 150 mg via INTRAVENOUS
  Filled 2023-11-22: qty 150

## 2023-11-22 MED ORDER — DEXAMETHASONE SODIUM PHOSPHATE 10 MG/ML IJ SOLN
10.0000 mg | Freq: Once | INTRAMUSCULAR | Status: AC
Start: 2023-11-22 — End: 2023-11-22
  Administered 2023-11-22: 10 mg via INTRAVENOUS
  Filled 2023-11-22: qty 1

## 2023-11-22 MED ORDER — ACETAMINOPHEN 325 MG PO TABS
650.0000 mg | ORAL_TABLET | Freq: Once | ORAL | Status: AC
  Administered 2023-11-22: 650 mg via ORAL
  Filled 2023-11-22: qty 2

## 2023-11-22 MED ORDER — SODIUM CHLORIDE 0.9 % IV SOLN: Freq: Once | INTRAVENOUS | Status: AC

## 2023-11-22 MED ORDER — PALONOSETRON HCL INJECTION 0.25 MG/5ML
0.2500 mg | Freq: Once | INTRAVENOUS | Status: AC
Start: 2023-11-22 — End: 2023-11-22
  Administered 2023-11-22: 0.25 mg via INTRAVENOUS
  Filled 2023-11-22: qty 5

## 2023-11-22 MED ORDER — FAMOTIDINE IN NACL 20-0.9 MG/50ML-% IV SOLN
20.0000 mg | Freq: Once | INTRAVENOUS | Status: AC
  Administered 2023-11-22: 20 mg via INTRAVENOUS
  Filled 2023-11-22: qty 50

## 2023-11-22 MED ORDER — SODIUM CHLORIDE 0.9% FLUSH
10.0000 mL | Freq: Once | INTRAVENOUS | Status: AC
  Administered 2023-11-22: 10 mL

## 2023-11-22 MED ORDER — HEPARIN SOD (PORK) LOCK FLUSH 100 UNIT/ML IV SOLN
500.0000 [IU] | Freq: Once | INTRAVENOUS | Status: AC | PRN
  Administered 2023-11-22: 500 [IU]

## 2023-11-22 MED ORDER — ATROPINE SULFATE 1 MG/ML IV SOLN
0.5000 mg | Freq: Once | INTRAVENOUS | Status: AC | PRN
  Administered 2023-11-22: 0.5 mg via INTRAVENOUS
  Filled 2023-11-22: qty 1

## 2023-11-22 MED ORDER — SODIUM CHLORIDE 0.9% FLUSH
10.0000 mL | INTRAVENOUS | Status: DC | PRN
  Administered 2023-11-22: 10 mL

## 2023-11-22 MED ORDER — SODIUM CHLORIDE 0.9 % IV SOLN
7.5000 mg/kg | Freq: Once | INTRAVENOUS | Status: AC
  Administered 2023-11-22: 720 mg via INTRAVENOUS
  Filled 2023-11-22: qty 72

## 2023-11-22 NOTE — Progress Notes (Signed)
Per Lillard Anes, NP, OK to Tx today with pending phosphorus levels.

## 2023-11-22 NOTE — Patient Instructions (Signed)
[facility] - A DEPT OF [facility]  Discharge Instructions: Thank you for choosing [facility] to provide your oncology and hematology care.   If you have a lab appointment with the Cancer Center, please go directly to the Cancer Center and check in at the registration area.   Wear comfortable clothing and clothing appropriate for easy access to any Portacath or PICC line.   We strive to give you quality time with your provider. You may need to reschedule your appointment if you arrive late (15 or more minutes).  Arriving late affects you and other patients whose appointments are after yours.  Also, if you miss three or more appointments without notifying the office, you may be dismissed from the clinic at the provider's discretion.      For prescription refill requests, have your pharmacy contact our office and allow 72 hours for refills to be completed.    Today you received the following chemotherapy and/or immunotherapy agents: [name]      To help prevent nausea and vomiting after your treatment, we encourage you to take your nausea medication as directed.  BELOW ARE SYMPTOMS THAT SHOULD BE REPORTED IMMEDIATELY: *FEVER GREATER THAN 100.4 F (38 C) OR HIGHER *CHILLS OR SWEATING *NAUSEA AND VOMITING THAT IS NOT CONTROLLED WITH YOUR NAUSEA MEDICATION *UNUSUAL SHORTNESS OF BREATH *UNUSUAL BRUISING OR BLEEDING *URINARY PROBLEMS (pain or burning when urinating, or frequent urination) *BOWEL PROBLEMS (unusual diarrhea, constipation, pain near the anus) TENDERNESS IN MOUTH AND THROAT WITH OR WITHOUT PRESENCE OF ULCERS (sore throat, sores in mouth, or a toothache) UNUSUAL RASH, SWELLING OR PAIN  UNUSUAL VAGINAL DISCHARGE OR ITCHING   Items with * indicate a potential emergency and should be followed up as soon as possible or go to the Emergency Department if any problems should occur.  Please show the CHEMOTHERAPY ALERT CARD or IMMUNOTHERAPY  ALERT CARD at check-in to the Emergency Department and triage nurse.  Should you have questions after your visit or need to cancel or reschedule your appointment, please contact [facility] - A DEPT OF [facility]  Dept: [phone]  and follow the prompts.  Office hours are 8:00 a.m. to 4:30 p.m. Monday - Friday. Please note that voicemails left after 4:00 p.m. may not be returned until the following business day.  We are closed weekends and major holidays. You have access to a nurse at all times for urgent questions. Please call the main number to the clinic Dept: [phone] and follow the prompts.   For any non-urgent questions, you may also contact your provider using MyChart. We now offer e-Visits for anyone 66 and older to request care online for non-urgent symptoms. For details visit [email or web address].   Also download the MyChart app! Go to the app store, search "MyChart", open the app, select [facility], and log in with your MyChart username and password.

## 2023-11-22 NOTE — Progress Notes (Signed)
Pt declined post Marcus observation.  Tolerated Tx well without incident.  VSS at discharge.  Ambulated to lobby.

## 2023-11-25 ENCOUNTER — Ambulatory Visit (HOSPITAL_COMMUNITY): Payer: Medicare Other

## 2023-11-28 ENCOUNTER — Other Ambulatory Visit: Payer: 59

## 2023-11-28 ENCOUNTER — Ambulatory Visit: Payer: 59

## 2023-11-28 ENCOUNTER — Ambulatory Visit: Payer: 59 | Admitting: Adult Health

## 2023-11-28 ENCOUNTER — Ambulatory Visit (HOSPITAL_COMMUNITY): Payer: Medicare Other

## 2023-11-28 NOTE — Telephone Encounter (Signed)
Called patient and advise of injection added on 2/14

## 2023-11-29 ENCOUNTER — Ambulatory Visit (HOSPITAL_COMMUNITY)
Admission: RE | Admit: 2023-11-29 | Discharge: 2023-11-29 | Disposition: A | Discharge status: Home / Self Care | Payer: Medicare Other | Source: Ambulatory Visit | Attending: Hematology and Oncology | Admitting: Hematology and Oncology

## 2023-11-29 DIAGNOSIS — C771 Secondary and unspecified malignant neoplasm of intrathoracic lymph nodes: Secondary | ICD-10-CM | POA: Diagnosis present

## 2023-11-29 DIAGNOSIS — C50919 Malignant neoplasm of unspecified site of unspecified female breast: Secondary | ICD-10-CM | POA: Insufficient documentation

## 2023-11-29 LAB — GLUCOSE, CAPILLARY: Glucose-Capillary: 95 mg/dL (ref 70–99)

## 2023-11-29 MED ORDER — FLUDEOXYGLUCOSE F - 18 (FDG) INJECTION
10.1000 | Freq: Once | INTRAVENOUS | Status: AC
  Administered 2023-11-29: 10.172 via INTRAVENOUS

## 2023-12-05 ENCOUNTER — Ambulatory Visit: Payer: 59

## 2023-12-05 ENCOUNTER — Other Ambulatory Visit: Payer: 59

## 2023-12-05 NOTE — Assessment & Plan Note (Signed)
 Prior treatment: Verzinio with letrozole  started 06/08/2023-07/20/2023   CT CAP: Increasing mediastinal mass 11.6 cm (used to be 10.8 cm) effacing the heart and involving the sternum, additional mediastinal/pericardial lymph nodes increased right extra lymph node 1.5 cm, nodules 0.2 cm (used to be 0.8 cm), few new scattered lung nodules)  CT CAP 09/20/2023: Decrease in the size of the anterior mediastinal mass that effaces the heart and invades the sternum/chest wall, decreased size of the pleural-based left lower lobe lung nodule.  Scattered pulmonary nodules not visualized.  Decreased hepatic metastases.  Stable T1    Treatment plan: Trodelvy  started 07/25/2023, today is cycle 6    Chest discomfort: Secondary to malignancy.     Trodelvy  toxicities: Fatigue: multifactorial related to chemo and pacemaker  Hypomagnesemia: Oral magnesium  supplementation  Diarrhea.  Likely secondary to treatment, however in setting of antibiotic use 4 weeks ago and worsening  -GI pathogen stool panel and c diff to r/o infectious colitis.  -continue imodium PRN Breast Swelling.   Worsening swelling, no signs of infection on physical exam.  -repeat PET scan to r/o disease progression -recommended tight fitting bra for support.    PET/CT 11/29/2023:  Return to clinic for Trodelvy  treatments

## 2023-12-06 ENCOUNTER — Inpatient Hospital Stay: Payer: Medicare Other

## 2023-12-06 ENCOUNTER — Ambulatory Visit: Payer: 59 | Admitting: Adult Health

## 2023-12-06 ENCOUNTER — Inpatient Hospital Stay (HOSPITAL_BASED_OUTPATIENT_CLINIC_OR_DEPARTMENT_OTHER): Payer: Medicare Other | Admitting: Hematology and Oncology

## 2023-12-06 ENCOUNTER — Other Ambulatory Visit: Payer: Self-pay | Admitting: Hematology and Oncology

## 2023-12-06 ENCOUNTER — Inpatient Hospital Stay: Payer: Medicare Other | Attending: Hematology and Oncology

## 2023-12-06 VITALS — BP 127/76 | HR 98 | Temp 97.6°F | Resp 19 | Ht 66.0 in | Wt 198.5 lb

## 2023-12-06 DIAGNOSIS — R5383 Other fatigue: Secondary | ICD-10-CM | POA: Diagnosis not present

## 2023-12-06 DIAGNOSIS — C787 Secondary malignant neoplasm of liver and intrahepatic bile duct: Secondary | ICD-10-CM | POA: Diagnosis not present

## 2023-12-06 DIAGNOSIS — Z79899 Other long term (current) drug therapy: Secondary | ICD-10-CM | POA: Diagnosis not present

## 2023-12-06 DIAGNOSIS — Z79818 Long term (current) use of other agents affecting estrogen receptors and estrogen levels: Secondary | ICD-10-CM | POA: Insufficient documentation

## 2023-12-06 DIAGNOSIS — Z9221 Personal history of antineoplastic chemotherapy: Secondary | ICD-10-CM | POA: Diagnosis not present

## 2023-12-06 DIAGNOSIS — R197 Diarrhea, unspecified: Secondary | ICD-10-CM | POA: Diagnosis not present

## 2023-12-06 DIAGNOSIS — Z95828 Presence of other vascular implants and grafts: Secondary | ICD-10-CM

## 2023-12-06 DIAGNOSIS — Z171 Estrogen receptor negative status [ER-]: Secondary | ICD-10-CM | POA: Insufficient documentation

## 2023-12-06 DIAGNOSIS — C50012 Malignant neoplasm of nipple and areola, left female breast: Secondary | ICD-10-CM | POA: Insufficient documentation

## 2023-12-06 DIAGNOSIS — C50919 Malignant neoplasm of unspecified site of unspecified female breast: Secondary | ICD-10-CM | POA: Diagnosis not present

## 2023-12-06 DIAGNOSIS — Z923 Personal history of irradiation: Secondary | ICD-10-CM | POA: Diagnosis not present

## 2023-12-06 DIAGNOSIS — R918 Other nonspecific abnormal finding of lung field: Secondary | ICD-10-CM | POA: Diagnosis not present

## 2023-12-06 DIAGNOSIS — J9801 Acute bronchospasm: Secondary | ICD-10-CM

## 2023-12-06 DIAGNOSIS — C771 Secondary and unspecified malignant neoplasm of intrathoracic lymph nodes: Secondary | ICD-10-CM | POA: Diagnosis not present

## 2023-12-06 DIAGNOSIS — Z5112 Encounter for antineoplastic immunotherapy: Secondary | ICD-10-CM | POA: Diagnosis not present

## 2023-12-06 DIAGNOSIS — K769 Liver disease, unspecified: Secondary | ICD-10-CM

## 2023-12-06 DIAGNOSIS — N6331 Unspecified lump in axillary tail of the right breast: Secondary | ICD-10-CM

## 2023-12-06 LAB — CBC WITH DIFFERENTIAL (CANCER CENTER ONLY)
Abs Immature Granulocytes: 0.02 10*3/uL (ref 0.00–0.07)
Basophils Absolute: 0 10*3/uL (ref 0.0–0.1)
Basophils Relative: 1 %
Eosinophils Absolute: 0.1 10*3/uL (ref 0.0–0.5)
Eosinophils Relative: 4 %
HCT: 33.7 % — ABNORMAL LOW (ref 36.0–46.0)
Hemoglobin: 10.7 g/dL — ABNORMAL LOW (ref 12.0–15.0)
Immature Granulocytes: 1 %
Lymphocytes Relative: 12 %
Lymphs Abs: 0.3 10*3/uL — ABNORMAL LOW (ref 0.7–4.0)
MCH: 27.8 pg (ref 26.0–34.0)
MCHC: 31.8 g/dL (ref 30.0–36.0)
MCV: 87.5 fL (ref 80.0–100.0)
Monocytes Absolute: 0.3 10*3/uL (ref 0.1–1.0)
Monocytes Relative: 11 %
Neutro Abs: 2.1 10*3/uL (ref 1.7–7.7)
Neutrophils Relative %: 71 %
Platelet Count: 196 10*3/uL (ref 150–400)
RBC: 3.85 MIL/uL — ABNORMAL LOW (ref 3.87–5.11)
RDW: 17.9 % — ABNORMAL HIGH (ref 11.5–15.5)
WBC Count: 2.9 10*3/uL — ABNORMAL LOW (ref 4.0–10.5)
nRBC: 0 % (ref 0.0–0.2)

## 2023-12-06 LAB — CMP (CANCER CENTER ONLY)
ALT: 13 U/L (ref 0–44)
AST: 13 U/L — ABNORMAL LOW (ref 15–41)
Albumin: 4 g/dL (ref 3.5–5.0)
Alkaline Phosphatase: 83 U/L (ref 38–126)
Anion gap: 7 (ref 5–15)
BUN: 14 mg/dL (ref 6–20)
CO2: 29 mmol/L (ref 22–32)
Calcium: 9.1 mg/dL (ref 8.9–10.3)
Chloride: 102 mmol/L (ref 98–111)
Creatinine: 0.91 mg/dL (ref 0.44–1.00)
GFR, Estimated: >60 mL/min (ref >60)
Glucose, Bld: 102 mg/dL — ABNORMAL HIGH (ref 70–99)
Potassium: 3.7 mmol/L (ref 3.5–5.1)
Sodium: 138 mmol/L (ref 135–145)
Total Bilirubin: 0.3 mg/dL (ref 0.0–1.2)
Total Protein: 6.9 g/dL (ref 6.5–8.1)

## 2023-12-06 LAB — MAGNESIUM: Magnesium: 2 mg/dL (ref 1.7–2.4)

## 2023-12-06 LAB — PHOSPHORUS: Phosphorus: 4.2 mg/dL (ref 2.5–4.6)

## 2023-12-06 MED ORDER — DIPHENHYDRAMINE HCL 50 MG/ML IJ SOLN
50.0000 mg | Freq: Once | INTRAMUSCULAR | Status: AC
  Administered 2023-12-06: 50 mg via INTRAVENOUS
  Filled 2023-12-06: qty 1

## 2023-12-06 MED ORDER — ALBUTEROL SULFATE 0.63 MG/3ML IN NEBU: 1.0000 | INHALATION_SOLUTION | Freq: Four times a day (QID) | RESPIRATORY_TRACT | 12 refills | Status: DC | PRN

## 2023-12-06 MED ORDER — DEXAMETHASONE SODIUM PHOSPHATE 10 MG/ML IJ SOLN
10.0000 mg | Freq: Once | INTRAMUSCULAR | Status: AC
  Administered 2023-12-06: 10 mg via INTRAVENOUS
  Filled 2023-12-06: qty 1

## 2023-12-06 MED ORDER — SODIUM CHLORIDE 0.9 % IV SOLN
7.5000 mg/kg | Freq: Once | INTRAVENOUS | Status: AC
  Administered 2023-12-06: 720 mg via INTRAVENOUS
  Filled 2023-12-06: qty 72

## 2023-12-06 MED ORDER — ACETAMINOPHEN 325 MG PO TABS
650.0000 mg | ORAL_TABLET | Freq: Once | ORAL | Status: AC
  Administered 2023-12-06: 650 mg via ORAL
  Filled 2023-12-06: qty 2

## 2023-12-06 MED ORDER — HEPARIN SOD (PORK) LOCK FLUSH 100 UNIT/ML IV SOLN
500.0000 [IU] | Freq: Once | INTRAVENOUS | Status: AC | PRN
  Administered 2023-12-06: 500 [IU]

## 2023-12-06 MED ORDER — PALONOSETRON HCL INJECTION 0.25 MG/5ML
0.2500 mg | Freq: Once | INTRAVENOUS | Status: AC
  Administered 2023-12-06: 0.25 mg via INTRAVENOUS
  Filled 2023-12-06: qty 5

## 2023-12-06 MED ORDER — SODIUM CHLORIDE 0.9% FLUSH
10.0000 mL | Freq: Once | INTRAVENOUS | Status: AC
  Administered 2023-12-06: 10 mL

## 2023-12-06 MED ORDER — FAMOTIDINE IN NACL 20-0.9 MG/50ML-% IV SOLN
20.0000 mg | Freq: Once | INTRAVENOUS | Status: AC
  Administered 2023-12-06: 20 mg via INTRAVENOUS
  Filled 2023-12-06: qty 50

## 2023-12-06 MED ORDER — SODIUM CHLORIDE 0.9 % IV SOLN: Freq: Once | INTRAVENOUS | Status: AC

## 2023-12-06 MED ORDER — SODIUM CHLORIDE 0.9 % IV SOLN
150.0000 mg | Freq: Once | INTRAVENOUS | Status: AC
  Administered 2023-12-06: 150 mg via INTRAVENOUS
  Filled 2023-12-06: qty 150

## 2023-12-06 MED ORDER — SODIUM CHLORIDE 0.9% FLUSH
10.0000 mL | INTRAVENOUS | Status: DC | PRN
  Administered 2023-12-06: 10 mL

## 2023-12-06 NOTE — Progress Notes (Signed)
Patient Care Team: Arlina Robes, MD as PCP - General (Family Medicine) Serena Croissant, MD as Consulting Physician (Hematology and Oncology) Pickenpack-Cousar, Arty Baumgartner, NP as Nurse Practitioner [facility])  DIAGNOSIS:  Encounter Diagnoses  Name Primary?   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)  06/30/2011 Initial Diagnosis   Breast cancer, IDC, Left, Stage III, Triple negative, 7.6 cm breast mass and palpable axillary mass   09/06/2011 Miscellaneous   BRCA 1 and 2: Negative   09/14/2011 - 11/23/2011 Neo-Adjuvant Chemotherapy   Dose dense FEC followed by dose dense Taxotere   12/21/2011 Surgery   Left lumpectomy: High-grade poorly differentiated IDC 1.7 cm with high-grade DCIS, margins negative, 3/7 lymph nodes positive, ER 0%, PR 0%, HER-2 negative ratio 1.33 T1CN1 stage IIb   12/31/2011 - 02/15/2012 Radiation Therapy   Radiation at [facility]   09/26/2017 Relapse/Recurrence   Left breast upper outer quadrant within the lumpectomy bed: Fibrosis no malignancy, right axillary lymph node biopsy metastatic high-grade carcinoma ER 0%, PR 0%, Ki-67 90%, HER-2 negative ratio 1.11 (similar to previous ductal carcinoma)   11/14/2017 - 03/06/2018 Neo-Adjuvant Chemotherapy   Neo-Adjuvant chemotherapy with Gemzar and carboplatin days 1 and 8 every 3 weeks   01/10/2018 Genetic Testing   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  The Hereditary Gene Panel offered by [facility] includes sequencing and/or deletion duplication testing of the following 47 genes: APC, ATM, AXIN2, BARD1, BMPR1A, BRCA1, BRCA2, BRIP1, CDH1, CDK4, CDKN2A (p14ARF), CDKN2A (p16INK4a), CHEK2, CTNNA1, DICER1, EPCAM (Deletion/duplication testing only), GREM1 (promoter region  deletion/duplication testing only), KIT, MEN1, MLH1, MSH2, MSH3, MSH6, MUTYH, NBN, NF1, NHTL1, PALB2, PDGFRA, PMS2, POLD1, POLE, PTEN, RAD50, RAD51C, RAD51D, SDHB, SDHC, SDHD, SMAD4, SMARCA4. STK11, TP53, TSC1, TSC2, and VHL.  The following genes were evaluated for sequence changes only: SDHA and HOXB13 c.251G>A variant only. The report date is January 10, 2018.    05/31/2023 Procedure   Mediastinal mass biopsy: Metastatic poorly differentiated carcinoma compatible with breast primary ER +60%, PR 0%, HER2 0, Ki-67 40%    06/02/2023 PET scan   PET/CT Anterior mediastinal mass is hypermetabolic and centrally necrotic, soft tissue lesions anterior to the heart, 10 mm pleural-based lesion, level 2 cervical nodes, hypermetabolism hepatic dome mottled FDG accumulation throughout the skeletal system    06/06/2023 - 07/14/2023 Anti-estrogen oral therapy   Antiestrogen therapy with Verzenio and letrozole    07/07/2023 Relapse/Recurrence   Increasing mediastinal mass 11.6 cm (used to be 10.8 cm) effacing the heart and involving the sternum, additional mediastinal/pericardial lymph nodes increased right extra lymph node 1.5 cm, nodules 0.2 cm (used to be 0.8 cm), few new scattered lung nodules)   07/25/2023 -  Chemotherapy   Patient is on Treatment Plan : BREAST METASTATIC Sacituzumab govitecan-hziy [name]) D1,8 q21d     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)  04/06/2018 Surgery   Right axillary lymph node dissection: 1/11 node positive for metastatic high-grade carcinoma   04/25/2018 Initial Diagnosis   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)   04/25/2018 Cancer Staging   Staging form: Breast, AJCC 8th Edition - Clinical: Stage IIB (cT0, cN1, cM0, G3, ER-, PR-, HER2-) - Signed by Lonie Peak, MD on 04/25/2018   05/16/2018 - 06/22/2018 Radiation Therapy   Adjuvant radiation therapy with  Xeloda   07/11/2018 - 10/24/2018 Chemotherapy   Adjuvant chemotherapy with CMF     CHIEF  COMPLIANT: Follow-up of metastatic breast cancer on Trodelvy status post PET CT scan  HISTORY OF PRESENT ILLNESS:  History of Present Illness   Jessica Simpson is a 53 year old female who presents for follow-up regarding her recent PET scan and ongoing cancer treatment.  She has experienced a worsening cough over the last few days and produced sputum yesterday. No fevers are present. She continues to have coughing and breathing difficulties, which may be related to radiation scarring from previous treatments in August and September.  Her recent blood work shows a total white blood cell count of 2.9, which is slightly low, but her neutrophil count is 2.1, which is adequate. Her hemoglobin level is 10.7, an improvement from previous levels in the nines, and her platelet count is 196.  She does not currently use a nebulizer but has a sleep apnea machine and is experiencing issues with billing for the machine.         ALLERGIES:  is allergic to codeine and hydrocodone-acetaminophen.  MEDICATIONS:  Current Outpatient Medications  Medication Sig Dispense Refill   albuterol (ACCUNEB) 0.63 MG/3ML nebulizer solution Take 3 mLs (0.63 mg total) by nebulization every 6 (six) hours as needed for wheezing. 75 mL 12   acetaminophen (TYLENOL) 500 MG tablet Take 500 mg by mouth every 6 (six) hours as needed. For pain (Patient not taking: Reported on 10/14/2023)     amLODipine (NORVASC) 5 MG tablet Take 10 mg by mouth daily.  6   Cyanocobalamin (VITAMIN B-12) 5000 MCG TBDP Take 5,000 mcg by mouth daily.     ibuprofen (ADVIL) 800 MG tablet Take 1 tablet (800 mg total) by mouth every 8 (eight) hours as needed. (Patient not taking: Reported on 10/14/2023) 30 tablet 0   lidocaine-prilocaine (EMLA) cream Apply to affected area once 30 g 3   loratadine (CLARITIN) 10 MG tablet Take 10 mg by mouth daily as needed for allergies.     magnesium oxide (MAG-OX) 400 (240 Mg) MG tablet Take 1 tablet (400 mg  total) by mouth daily. 30 tablet 1   metoprolol succinate (TOPROL XL) 25 MG 24 hr tablet Take 1 tablet (25 mg total) by mouth daily. 90 tablet 3   Omega-3 Fatty Acids (FISH OIL MAXIMUM STRENGTH) 1200 MG CPDR Take 1,200 mg by mouth daily.     ondansetron (ZOFRAN) 8 MG tablet Take 1 tablet (8 mg total) by mouth 2 (two) times daily. 60 tablet 6   prochlorperazine (COMPAZINE) 10 MG tablet Take 1 tablet (10 mg total) by mouth every 6 (six) hours as needed for nausea or vomiting. (Patient not taking: Reported on 10/14/2023) 30 tablet 6   Vitamin D, Ergocalciferol, (DRISDOL) 1.25 MG (50000 UNIT) CAPS capsule Take 1 capsule (50,000 Units total) by mouth once a week. 12 capsule 2   No current facility-administered medications for this visit.    PHYSICAL EXAMINATION: ECOG PERFORMANCE STATUS: 1 - Symptomatic but completely ambulatory  Vitals:   12/06/23 0847  BP: 127/76  Pulse: 98  Resp: 19  Temp: 97.6 F (36.4 C)  SpO2: 97%   Filed Weights   12/06/23 0847  Weight: 198 lb 8 oz (90 kg)     LABORATORY DATA:  I have reviewed the data as listed    Latest Ref Rng & Units 11/22/2023    7:35 AM 11/15/2023   11:37 AM 11/02/2023  7:46 AM  CMP  Glucose 70 - 99 mg/dL 409  811  914   BUN 6 - 20 mg/dL 13  11  18    Creatinine 0.44 - 1.00 mg/dL 7.82  9.56  2.13   Sodium 135 - 145 mmol/L 137  140  139   Potassium 3.5 - 5.1 mmol/L 3.5  3.5  3.2   Chloride 98 - 111 mmol/L 101  104  102   CO2 22 - 32 mmol/L 28  28  28    Calcium 8.9 - 10.3 mg/dL 9.4  9.4  9.3   Total Protein 6.5 - 8.1 g/dL 7.1  7.1  7.2   Total Bilirubin 0.0 - 1.2 mg/dL 0.4  0.4  0.5   Alkaline Phos 38 - 126 U/L 85  80  83   AST 15 - 41 U/L 12  13  16    ALT 0 - 44 U/L 14  11  16      Lab Results  Component Value Date   WBC 2.9 (L) 12/06/2023   HGB 10.7 (L) 12/06/2023   HCT 33.7 (L) 12/06/2023   MCV 87.5 12/06/2023   PLT 196 12/06/2023   NEUTROABS 2.1 12/06/2023    ASSESSMENT & PLAN:  Breast cancer metastasized to  intrathoracic lymph node (HCC) Prior treatment: Verzinio with letrozole started 06/08/2023-07/20/2023   CT CAP: Increasing mediastinal mass 11.6 cm (used to be 10.8 cm) effacing the heart and involving the sternum, additional mediastinal/pericardial lymph nodes increased right extra lymph node 1.5 cm, nodules 0.2 cm (used to be 0.8 cm), few new scattered lung nodules)  CT CAP 09/20/2023: Decrease in the size of the anterior mediastinal mass that effaces the heart and invades the sternum/chest wall, decreased size of the pleural-based left lower lobe lung nodule.  Scattered pulmonary nodules not visualized.  Decreased hepatic metastases.  Stable T1    Treatment plan: [name] started 07/25/2023, today is cycle 6    Chest discomfort: Secondary to malignancy.     [name] toxicities: Fatigue: multifactorial related to chemo and pacemaker  Hypomagnesemia: Oral magnesium supplementation  Diarrhea.    PET/CT 11/29/2023: To my evaluation the anterior mediastinal mass is no longer active.  There appears to be cervical lymph nodes as well as metastatic disease to the liver.  We will wait for the final radiology reading to determine if there is any evidence of progression. We may consider liver directed therapy in the future.  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 Return to clinic for Trodelvy treatments      Orders Placed This Encounter  Procedures   For home use only DME Nebulizer machine    Patient needs a nebulizer to treat with the following condition:   Bronchospasm, acute [702019]    Length of Need:   Lifetime    Additional equipment included:   Administration kit    Additional equipment included:   Filter   The patient has a good understanding of the overall plan. she agrees with it. she will call with any problems that may develop before the next visit here. Total time spent: 30 mins including face to face time and time spent for  planning, charting and co-ordination of care   Tamsen Meek, MD 12/06/23

## 2023-12-06 NOTE — Progress Notes (Signed)
I discussed with Dr. Archer Asa if liver directed therapy would be an option for her.  He wanted [place] to make sure that this is a metastatic disease in the liver and not in the porta hepatis.  For that reason we will obtain a liver MRI.  Patient was agreeable.  She also brought up the issue of swelling of the right breast which on the PET scan showed as edema.  I believe this is lymphedema.  There is no hypermetabolic activity to be concern for metastatic disease.

## 2023-12-06 NOTE — Patient Instructions (Signed)
[facility] - A DEPT OF [facility]  Discharge Instructions: Thank you for choosing [facility] to provide your oncology and hematology care.   If you have a lab appointment with the Cancer Center, please go directly to the Cancer Center and check in at the registration area.   Wear comfortable clothing and clothing appropriate for easy access to any Portacath or PICC line.   We strive to give you quality time with your provider. You may need to reschedule your appointment if you arrive late (15 or more minutes).  Arriving late affects you and other patients whose appointments are after yours.  Also, if you miss three or more appointments without notifying the office, you may be dismissed from the clinic at the provider's discretion.      For prescription refill requests, have your pharmacy contact our office and allow 72 hours for refills to be completed.    Today you received the following chemotherapy and/or immunotherapy agents trodelvy      To help prevent nausea and vomiting after your treatment, we encourage you to take your nausea medication as directed.  BELOW ARE SYMPTOMS THAT SHOULD BE REPORTED IMMEDIATELY: *FEVER GREATER THAN 100.4 F (38 C) OR HIGHER *CHILLS OR SWEATING *NAUSEA AND VOMITING THAT IS NOT CONTROLLED WITH YOUR NAUSEA MEDICATION *UNUSUAL SHORTNESS OF BREATH *UNUSUAL BRUISING OR BLEEDING *URINARY PROBLEMS (pain or burning when urinating, or frequent urination) *BOWEL PROBLEMS (unusual diarrhea, constipation, pain near the anus) TENDERNESS IN MOUTH AND THROAT WITH OR WITHOUT PRESENCE OF ULCERS (sore throat, sores in mouth, or a toothache) UNUSUAL RASH, SWELLING OR PAIN  UNUSUAL VAGINAL DISCHARGE OR ITCHING   Items with * indicate a potential emergency and should be followed up as soon as possible or go to the Emergency Department if any problems should occur.  Please show the CHEMOTHERAPY ALERT CARD or IMMUNOTHERAPY  ALERT CARD at check-in to the Emergency Department and triage nurse.  Should you have questions after your visit or need to cancel or reschedule your appointment, please contact [facility] - A DEPT OF [facility]  Dept: [phone]  and follow the prompts.  Office hours are 8:00 a.m. to 4:30 p.m. Monday - Friday. Please note that voicemails left after 4:00 p.m. may not be returned until the following business day.  We are closed weekends and major holidays. You have access to a nurse at all times for urgent questions. Please call the main number to the clinic Dept: [phone] and follow the prompts.   For any non-urgent questions, you may also contact your provider using MyChart. We now offer e-Visits for anyone 67 and older to request care online for non-urgent symptoms. For details visit [email or web address].   Also download the MyChart app! Go to the app store, search "MyChart", open the app, select [facility], and log in with your MyChart username and password.

## 2023-12-08 ENCOUNTER — Telehealth: Payer: Self-pay | Admitting: Hematology and Oncology

## 2023-12-08 NOTE — Telephone Encounter (Signed)
Rescheduled appointment per patients request via incoming call. Patient is aware of the changes made to her upcoming appointment.

## 2023-12-09 ENCOUNTER — Inpatient Hospital Stay: Payer: Medicare Other

## 2023-12-09 VITALS — BP 127/76 | HR 103 | Temp 98.5°F | Resp 19

## 2023-12-09 DIAGNOSIS — C50919 Malignant neoplasm of unspecified site of unspecified female breast: Secondary | ICD-10-CM

## 2023-12-09 DIAGNOSIS — C50012 Malignant neoplasm of nipple and areola, left female breast: Secondary | ICD-10-CM | POA: Diagnosis not present

## 2023-12-09 DIAGNOSIS — N6331 Unspecified lump in axillary tail of the right breast: Secondary | ICD-10-CM

## 2023-12-09 MED ORDER — FILGRASTIM-SNDZ 480 MCG/0.8ML IJ SOSY
480.0000 ug | PREFILLED_SYRINGE | Freq: Once | INTRAMUSCULAR | Status: AC
  Administered 2023-12-09: 480 ug via SUBCUTANEOUS
  Filled 2023-12-09: qty 0.8

## 2023-12-12 ENCOUNTER — Inpatient Hospital Stay: Payer: Medicare Other | Admitting: Hematology and Oncology

## 2023-12-12 ENCOUNTER — Telehealth: Payer: Self-pay | Admitting: Hematology and Oncology

## 2023-12-12 ENCOUNTER — Ambulatory Visit (INDEPENDENT_AMBULATORY_CARE_PROVIDER_SITE_OTHER): Payer: 59

## 2023-12-12 DIAGNOSIS — I495 Sick sinus syndrome: Secondary | ICD-10-CM

## 2023-12-12 LAB — CUP PACEART REMOTE DEVICE CHECK
Battery Remaining Longevity: 116 mo
Battery Voltage: 3.03 V
Brady Statistic AP VP Percent: 0.73 %
Brady Statistic AP VS Percent: 0 %
Brady Statistic AS VP Percent: 99.06 %
Brady Statistic AS VS Percent: 0.23 %
Brady Statistic RA Percent Paced: 1.14 %
Brady Statistic RV Percent Paced: 99.76 %
Date Time Interrogation Session: 20250214202925
Implantable Lead Connection Status: 753985
Implantable Lead Connection Status: 753985
Implantable Lead Implant Date: 20240417
Implantable Lead Implant Date: 20240417
Implantable Lead Location: 753859
Implantable Lead Location: 753860
Implantable Lead Model: 3830
Implantable Lead Model: 5076
Implantable Pulse Generator Implant Date: 20240417
Lead Channel Impedance Value: 247 Ohm
Lead Channel Impedance Value: 304 Ohm
Lead Channel Impedance Value: 342 Ohm
Lead Channel Impedance Value: 570 Ohm
Lead Channel Pacing Threshold Amplitude: 0.75 V
Lead Channel Pacing Threshold Amplitude: 1.375 V
Lead Channel Pacing Threshold Pulse Width: 0.4 ms
Lead Channel Pacing Threshold Pulse Width: 0.4 ms
Lead Channel Sensing Intrinsic Amplitude: 14.875 mV
Lead Channel Sensing Intrinsic Amplitude: 14.875 mV
Lead Channel Sensing Intrinsic Amplitude: 2 mV
Lead Channel Sensing Intrinsic Amplitude: 2 mV
Lead Channel Setting Pacing Amplitude: 1.5 V
Lead Channel Setting Pacing Amplitude: 2.75 V
Lead Channel Setting Pacing Pulse Width: 0.4 ms
Lead Channel Setting Sensing Sensitivity: 0.9 mV
Zone Setting Status: 755011
Zone Setting Status: 755011

## 2023-12-12 MED FILL — Fosaprepitant Dimeglumine For IV Infusion 150 MG (Base Eq): INTRAVENOUS | Qty: 5 | Status: AC

## 2023-12-12 NOTE — Telephone Encounter (Signed)
Scheduled appointment per scheduling message. Patient is aware of the made appointment for 2/18 at 0745 with Dr.Gudena

## 2023-12-13 ENCOUNTER — Inpatient Hospital Stay: Payer: Medicare Other

## 2023-12-13 ENCOUNTER — Inpatient Hospital Stay (HOSPITAL_BASED_OUTPATIENT_CLINIC_OR_DEPARTMENT_OTHER): Payer: Medicare Other | Admitting: Hematology and Oncology

## 2023-12-13 ENCOUNTER — Other Ambulatory Visit: Payer: Self-pay | Admitting: *Deleted

## 2023-12-13 ENCOUNTER — Encounter: Payer: Self-pay | Admitting: Internal Medicine

## 2023-12-13 VITALS — BP 135/82 | HR 102 | Resp 16

## 2023-12-13 VITALS — BP 102/79 | HR 81 | Temp 97.6°F | Resp 17 | Ht 66.0 in | Wt 195.9 lb

## 2023-12-13 DIAGNOSIS — C50919 Malignant neoplasm of unspecified site of unspecified female breast: Secondary | ICD-10-CM | POA: Diagnosis not present

## 2023-12-13 DIAGNOSIS — K769 Liver disease, unspecified: Secondary | ICD-10-CM

## 2023-12-13 DIAGNOSIS — N6331 Unspecified lump in axillary tail of the right breast: Secondary | ICD-10-CM

## 2023-12-13 DIAGNOSIS — C50012 Malignant neoplasm of nipple and areola, left female breast: Secondary | ICD-10-CM | POA: Diagnosis not present

## 2023-12-13 DIAGNOSIS — Z95828 Presence of other vascular implants and grafts: Secondary | ICD-10-CM

## 2023-12-13 DIAGNOSIS — C771 Secondary and unspecified malignant neoplasm of intrathoracic lymph nodes: Secondary | ICD-10-CM | POA: Diagnosis not present

## 2023-12-13 LAB — CMP (CANCER CENTER ONLY)
ALT: 10 U/L (ref 0–44)
AST: 11 U/L — ABNORMAL LOW (ref 15–41)
Albumin: 4 g/dL (ref 3.5–5.0)
Alkaline Phosphatase: 91 U/L (ref 38–126)
Anion gap: 7 (ref 5–15)
BUN: 13 mg/dL (ref 6–20)
CO2: 29 mmol/L (ref 22–32)
Calcium: 9.4 mg/dL (ref 8.9–10.3)
Chloride: 103 mmol/L (ref 98–111)
Creatinine: 0.84 mg/dL (ref 0.44–1.00)
GFR, Estimated: >60 mL/min (ref >60)
Glucose, Bld: 114 mg/dL — ABNORMAL HIGH (ref 70–99)
Potassium: 3.5 mmol/L (ref 3.5–5.1)
Sodium: 139 mmol/L (ref 135–145)
Total Bilirubin: 0.3 mg/dL (ref 0.0–1.2)
Total Protein: 6.8 g/dL (ref 6.5–8.1)

## 2023-12-13 LAB — CBC WITH DIFFERENTIAL (CANCER CENTER ONLY)
Abs Immature Granulocytes: 0.18 10*3/uL — ABNORMAL HIGH (ref 0.00–0.07)
Basophils Absolute: 0 10*3/uL (ref 0.0–0.1)
Basophils Relative: 1 %
Eosinophils Absolute: 0.1 10*3/uL (ref 0.0–0.5)
Eosinophils Relative: 2 %
HCT: 35.3 % — ABNORMAL LOW (ref 36.0–46.0)
Hemoglobin: 11.3 g/dL — ABNORMAL LOW (ref 12.0–15.0)
Immature Granulocytes: 4 %
Lymphocytes Relative: 11 %
Lymphs Abs: 0.5 10*3/uL — ABNORMAL LOW (ref 0.7–4.0)
MCH: 28.1 pg (ref 26.0–34.0)
MCHC: 32 g/dL (ref 30.0–36.0)
MCV: 87.8 fL (ref 80.0–100.0)
Monocytes Absolute: 0.4 10*3/uL (ref 0.1–1.0)
Monocytes Relative: 10 %
Neutro Abs: 2.9 10*3/uL (ref 1.7–7.7)
Neutrophils Relative %: 72 %
Platelet Count: 209 10*3/uL (ref 150–400)
RBC: 4.02 MIL/uL (ref 3.87–5.11)
RDW: 18.2 % — ABNORMAL HIGH (ref 11.5–15.5)
WBC Count: 4.1 10*3/uL (ref 4.0–10.5)
nRBC: 0 % (ref 0.0–0.2)

## 2023-12-13 LAB — MAGNESIUM: Magnesium: 2 mg/dL (ref 1.7–2.4)

## 2023-12-13 LAB — PHOSPHORUS: Phosphorus: 4.1 mg/dL (ref 2.5–4.6)

## 2023-12-13 MED ORDER — ACETAMINOPHEN 325 MG PO TABS
650.0000 mg | ORAL_TABLET | Freq: Once | ORAL | Status: AC
  Administered 2023-12-13: 650 mg via ORAL
  Filled 2023-12-13: qty 2

## 2023-12-13 MED ORDER — PALONOSETRON HCL INJECTION 0.25 MG/5ML
0.2500 mg | Freq: Once | INTRAVENOUS | Status: AC
  Administered 2023-12-13: 0.25 mg via INTRAVENOUS
  Filled 2023-12-13: qty 5

## 2023-12-13 MED ORDER — SODIUM CHLORIDE 0.9% FLUSH
10.0000 mL | Freq: Once | INTRAVENOUS | Status: AC
  Administered 2023-12-13: 10 mL

## 2023-12-13 MED ORDER — ATROPINE SULFATE 1 MG/ML IV SOLN
0.5000 mg | Freq: Once | INTRAVENOUS | Status: AC | PRN
  Administered 2023-12-13: 0.5 mg via INTRAVENOUS
  Filled 2023-12-13: qty 1

## 2023-12-13 MED ORDER — HEPARIN SOD (PORK) LOCK FLUSH 100 UNIT/ML IV SOLN
500.0000 [IU] | Freq: Once | INTRAVENOUS | Status: AC | PRN
  Administered 2023-12-13: 500 [IU]

## 2023-12-13 MED ORDER — SODIUM CHLORIDE 0.9% FLUSH
10.0000 mL | INTRAVENOUS | Status: DC | PRN
  Administered 2023-12-13: 10 mL

## 2023-12-13 MED ORDER — FAMOTIDINE IN NACL 20-0.9 MG/50ML-% IV SOLN
20.0000 mg | Freq: Once | INTRAVENOUS | Status: AC
  Administered 2023-12-13: 20 mg via INTRAVENOUS
  Filled 2023-12-13: qty 50

## 2023-12-13 MED ORDER — SODIUM CHLORIDE 0.9 % IV SOLN: Freq: Once | INTRAVENOUS | Status: AC

## 2023-12-13 MED ORDER — DIPHENHYDRAMINE HCL 50 MG/ML IJ SOLN
50.0000 mg | Freq: Once | INTRAMUSCULAR | Status: AC
Start: 2023-12-13 — End: 2023-12-13
  Administered 2023-12-13: 50 mg via INTRAVENOUS
  Filled 2023-12-13: qty 1

## 2023-12-13 MED ORDER — DEXAMETHASONE SODIUM PHOSPHATE 10 MG/ML IJ SOLN
10.0000 mg | Freq: Once | INTRAMUSCULAR | Status: AC
  Administered 2023-12-13: 10 mg via INTRAVENOUS
  Filled 2023-12-13: qty 1

## 2023-12-13 MED ORDER — SODIUM CHLORIDE 0.9 % IV SOLN
150.0000 mg | Freq: Once | INTRAVENOUS | Status: AC
  Administered 2023-12-13: 150 mg via INTRAVENOUS
  Filled 2023-12-13: qty 150

## 2023-12-13 MED ORDER — SODIUM CHLORIDE 0.9 % IV SOLN
7.5000 mg/kg | Freq: Once | INTRAVENOUS | Status: AC
  Administered 2023-12-13: 720 mg via INTRAVENOUS
  Filled 2023-12-13: qty 72

## 2023-12-13 NOTE — Progress Notes (Signed)
Pt declined to be observed for 30 minutes post Trodelvy infusion. Pt tolerated Tx well w/out incident. VSS at discharge.  Ambulatory to lobby.

## 2023-12-13 NOTE — Progress Notes (Signed)
Per Dr. Pamelia Hoit OK to proceed with tx today without CMP result.

## 2023-12-13 NOTE — Patient Instructions (Signed)
 [facility]  Discharge Instructions: Thank you for choosing [facility] to provide your oncology and hematology care.   If you have a lab appointment with the Cancer Center, please go directly to the Cancer Center and check in at the registration area.   Wear comfortable clothing and clothing appropriate for easy access to any Portacath or PICC line.   We strive to give you quality time with your provider. You may need to reschedule your appointment if you arrive late (15 or more minutes).  Arriving late affects you and other patients whose appointments are after yours.  Also, if you miss three or more appointments without notifying the office, you may be dismissed from the clinic at the provider's discretion.      For prescription refill requests, have your pharmacy contact our office and allow 72 hours for refills to be completed.    Today you received the following chemotherapy and/or immunotherapy agents: [name]      To help prevent nausea and vomiting after your treatment, we encourage you to take your nausea medication as directed.  BELOW ARE SYMPTOMS THAT SHOULD BE REPORTED IMMEDIATELY: *FEVER GREATER THAN 100.4 F (38 C) OR HIGHER *CHILLS OR SWEATING *NAUSEA AND VOMITING THAT IS NOT CONTROLLED WITH YOUR NAUSEA MEDICATION *UNUSUAL SHORTNESS OF BREATH *UNUSUAL BRUISING OR BLEEDING *URINARY PROBLEMS (pain or burning when urinating, or frequent urination) *BOWEL PROBLEMS (unusual diarrhea, constipation, pain near the anus) TENDERNESS IN MOUTH AND THROAT WITH OR WITHOUT PRESENCE OF ULCERS (sore throat, sores in mouth, or a toothache) UNUSUAL RASH, SWELLING OR PAIN  UNUSUAL VAGINAL DISCHARGE OR ITCHING   Items with * indicate a potential emergency and should be followed up as soon as possible or go to the Emergency Department if any problems should occur.  Please show the CHEMOTHERAPY ALERT CARD or IMMUNOTHERAPY  ALERT CARD at check-in to the Emergency Department and triage nurse.  Should you have questions after your visit or need to cancel or reschedule your appointment, please contact [facility]  Dept: [phone]  and follow the prompts.  Office hours are 8:00 a.m. to 4:30 p.m. Monday - Friday. Please note that voicemails left after 4:00 p.m. may not be returned until the following business day.  We are closed weekends and major holidays. You have access to a nurse at all times for urgent questions. Please call the main number to the clinic Dept: [phone] and follow the prompts.   For any non-urgent questions, you may also contact your provider using MyChart. We now offer e-Visits for anyone 66 and older to request care online for non-urgent symptoms. For details visit [email or web address].   Also download the MyChart app! Go to the app store, search "MyChart", open the app, select [place], and log in with your MyChart username and password.

## 2023-12-13 NOTE — Assessment & Plan Note (Signed)
Prior treatment: Verzinio with letrozole started 06/08/2023-07/20/2023   CT CAP: Increasing mediastinal mass 11.6 cm (used to be 10.8 cm) effacing the heart and involving the sternum, additional mediastinal/pericardial lymph nodes increased right extra lymph node 1.5 cm, nodules 0.2 cm (used to be 0.8 cm), few new scattered lung nodules)  CT CAP 09/20/2023: Decrease in the size of the anterior mediastinal mass that effaces the heart and invades the sternum/chest wall, decreased size of the pleural-based left lower lobe lung nodule.  Scattered pulmonary nodules not visualized.  Decreased hepatic metastases.  Stable T1    Treatment plan: [name] started 07/25/2023, today is cycle 6    Chest discomfort: Secondary to malignancy.     [name] toxicities: Fatigue: multifactorial related to chemo and pacemaker  Hypomagnesemia: Oral magnesium supplementation  Diarrhea.    PET/CT 11/29/2023: Progression of nodal metastases in the cervical region and supraclavicular region.  Hypermetabolic right axillary lymph node and soft tissue metastases in the subcutaneous fat of the right upper back.  Regression of the anterior mediastinal mass with near complete regression of metabolic activity.  Activity of the porta hepatis.   radiation pneumonitis: I sent a prescription for albuterol nebulizer and a nebulizer machine. Return to clinic for [facility] treatments

## 2023-12-13 NOTE — Progress Notes (Signed)
Patient Care Team: Arlina Robes, MD as PCP - General (Family Medicine) Serena Croissant, MD as Consulting Physician (Hematology and Oncology) Pickenpack-Cousar, Arty Baumgartner, NP as Nurse Practitioner [facility] and Palliative Medicine)  DIAGNOSIS:  Encounter Diagnosis  Name Primary?   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)  06/30/2011 Initial Diagnosis   Breast cancer, IDC, Left, Stage III, Triple negative, 7.6 cm breast mass and palpable axillary mass   09/06/2011 Miscellaneous   BRCA 1 and 2: Negative   09/14/2011 - 11/23/2011 Neo-Adjuvant Chemotherapy   Dose dense FEC followed by dose dense Taxotere   12/21/2011 Surgery   Left lumpectomy: High-grade poorly differentiated IDC 1.7 cm with high-grade DCIS, margins negative, 3/7 lymph nodes positive, ER 0%, PR 0%, HER-2 negative ratio 1.33 T1CN1 stage IIb   12/31/2011 - 02/15/2012 Radiation Therapy   Radiation at [facility]   09/26/2017 Relapse/Recurrence   Left breast upper outer quadrant within the lumpectomy bed: Fibrosis no malignancy, right axillary lymph node biopsy metastatic high-grade carcinoma ER 0%, PR 0%, Ki-67 90%, HER-2 negative ratio 1.11 (similar to previous ductal carcinoma)   11/14/2017 - 03/06/2018 Neo-Adjuvant Chemotherapy   Neo-Adjuvant chemotherapy with Gemzar and carboplatin days 1 and 8 every 3 weeks   01/10/2018 Genetic Testing   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  The Hereditary Gene Panel offered by [facility] includes sequencing and/or deletion duplication testing of the following 47 genes: APC, ATM, AXIN2, BARD1, BMPR1A, BRCA1, BRCA2, BRIP1, CDH1, CDK4, CDKN2A (p14ARF), CDKN2A (p16INK4a), CHEK2, CTNNA1, DICER1, EPCAM (Deletion/duplication testing only), GREM1 (promoter region deletion/duplication testing  only), KIT, MEN1, MLH1, MSH2, MSH3, MSH6, MUTYH, NBN, NF1, NHTL1, PALB2, PDGFRA, PMS2, POLD1, POLE, PTEN, RAD50, RAD51C, RAD51D, SDHB, SDHC, SDHD, SMAD4, SMARCA4. STK11, TP53, TSC1, TSC2, and VHL.  The following genes were evaluated for sequence changes only: SDHA and HOXB13 c.251G>A variant only. The report date is January 10, 2018.    05/31/2023 Procedure   Mediastinal mass biopsy: Metastatic poorly differentiated carcinoma compatible with breast primary ER +60%, PR 0%, HER2 0, Ki-67 40%    06/02/2023 PET scan   PET/CT Anterior mediastinal mass is hypermetabolic and centrally necrotic, soft tissue lesions anterior to the heart, 10 mm pleural-based lesion, level 2 cervical nodes, hypermetabolism hepatic dome mottled FDG accumulation throughout the skeletal system    06/06/2023 - 07/14/2023 Anti-estrogen oral therapy   Antiestrogen therapy with Verzenio and letrozole    07/07/2023 Relapse/Recurrence   Increasing mediastinal mass 11.6 cm (used to be 10.8 cm) effacing the heart and involving the sternum, additional mediastinal/pericardial lymph nodes increased right extra lymph node 1.5 cm, nodules 0.2 cm (used to be 0.8 cm), few new scattered lung nodules)   07/25/2023 -  Chemotherapy   Patient is on Treatment Plan : BREAST METASTATIC Sacituzumab govitecan-hziy [name]) D1,8 q21d     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)  04/06/2018 Surgery   Right axillary lymph node dissection: 1/11 node positive for metastatic high-grade carcinoma   04/25/2018 Initial Diagnosis   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)   04/25/2018 Cancer Staging   Staging form: Breast, AJCC 8th Edition - Clinical: Stage IIB (cT0, cN1, cM0, G3, ER-, PR-, HER2-) - Signed by Lonie Peak, MD on 04/25/2018   05/16/2018 - 06/22/2018 Radiation Therapy   Adjuvant radiation therapy with Xeloda   07/11/2018 -  10/24/2018 Chemotherapy   Adjuvant chemotherapy with CMF     CHIEF COMPLIANT: Metastatic breast  cancer on Trodelvy  HISTORY OF PRESENT ILLNESS:  History of Present Illness   Jessica Simpson is a 53 year old female with metastatic breast cancer who presents with concerns about lymph node progression in the neck.  Recent imaging has shown multiple enlarged lymph nodes in the cervical region on both sides, which have increased in number and size compared to previous scans. There is also a noted lymph node above the left clavicle. She notes that these lymph nodes do not feel larger to her compared to the beginning of her treatment, and she has to turn her neck and apply more pressure to feel them now. No nodules are palpable in the right upper back, where imaging suggested a possible deep-seated lymph node.  She has a pacemaker, which necessitates that her MRI be conducted at the hospital. She is awaiting further instructions regarding this procedure.  She has not yet had her blood drawn for this visit.         ALLERGIES:  is allergic to codeine and hydrocodone-acetaminophen.  MEDICATIONS:  Current Outpatient Medications  Medication Sig Dispense Refill   acetaminophen (TYLENOL) 500 MG tablet Take 500 mg by mouth every 6 (six) hours as needed. For pain (Patient not taking: Reported on 10/14/2023)     albuterol (ACCUNEB) 0.63 MG/3ML nebulizer solution Take 3 mLs (0.63 mg total) by nebulization every 6 (six) hours as needed for wheezing. 75 mL 12   amLODipine (NORVASC) 5 MG tablet Take 10 mg by mouth daily.  6   Cyanocobalamin (VITAMIN B-12) 5000 MCG TBDP Take 5,000 mcg by mouth daily.     ibuprofen (ADVIL) 800 MG tablet Take 1 tablet (800 mg total) by mouth every 8 (eight) hours as needed. (Patient not taking: Reported on 10/14/2023) 30 tablet 0   lidocaine-prilocaine (EMLA) cream Apply to affected area once 30 g 3   loratadine (CLARITIN) 10 MG tablet Take 10 mg by mouth daily as needed for allergies.     magnesium oxide (MAG-OX) 400 (240 Mg) MG tablet Take 1 tablet (400 mg  total) by mouth daily. 30 tablet 1   metoprolol succinate (TOPROL XL) 25 MG 24 hr tablet Take 1 tablet (25 mg total) by mouth daily. 90 tablet 3   Omega-3 Fatty Acids (FISH OIL MAXIMUM STRENGTH) 1200 MG CPDR Take 1,200 mg by mouth daily.     ondansetron (ZOFRAN) 8 MG tablet Take 1 tablet (8 mg total) by mouth 2 (two) times daily. 60 tablet 6   prochlorperazine (COMPAZINE) 10 MG tablet Take 1 tablet (10 mg total) by mouth every 6 (six) hours as needed for nausea or vomiting. (Patient not taking: Reported on 10/14/2023) 30 tablet 6   Vitamin D, Ergocalciferol, (DRISDOL) 1.25 MG (50000 UNIT) CAPS capsule Take 1 capsule (50,000 Units total) by mouth once a week. 12 capsule 2   No current facility-administered medications for this visit.    PHYSICAL EXAMINATION: ECOG PERFORMANCE STATUS: 1 - Symptomatic but completely ambulatory  Vitals:   12/13/23 0813  BP: 102/79  Pulse: 81  Resp: 17  Temp: 97.6 F (36.4 C)  SpO2: 98%   Filed Weights   12/13/23 0813  Weight: 195 lb 14.4 oz (88.9 kg)    Physical Exam   NECK: Multiple enlarged lymph nodes in the neck region bilaterally, with smaller lymph nodes and one above the left clavicle.      (exam performed  in the presence of a chaperone)  LABORATORY DATA:  I have reviewed the data as listed    Latest Ref Rng & Units 12/06/2023    8:28 AM 11/22/2023    7:35 AM 11/15/2023   11:37 AM  CMP  Glucose 70 - 99 mg/dL 161  096  045   BUN 6 - 20 mg/dL 14  13  11    Creatinine 0.44 - 1.00 mg/dL 4.09  8.11  9.14   Sodium 135 - 145 mmol/L 138  137  140   Potassium 3.5 - 5.1 mmol/L 3.7  3.5  3.5   Chloride 98 - 111 mmol/L 102  101  104   CO2 22 - 32 mmol/L 29  28  28    Calcium 8.9 - 10.3 mg/dL 9.1  9.4  9.4   Total Protein 6.5 - 8.1 g/dL 6.9  7.1  7.1   Total Bilirubin 0.0 - 1.2 mg/dL 0.3  0.4  0.4   Alkaline Phos 38 - 126 U/L 83  85  80   AST 15 - 41 U/L 13  12  13    ALT 0 - 44 U/L 13  14  11      Lab Results  Component Value Date   WBC 2.9  (L) 12/06/2023   HGB 10.7 (L) 12/06/2023   HCT 33.7 (L) 12/06/2023   MCV 87.5 12/06/2023   PLT 196 12/06/2023   NEUTROABS 2.1 12/06/2023    ASSESSMENT & PLAN:  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  Scattered pulmonary nodules not visualized.  Decreased hepatic metastases.  Stable T1    Treatment plan: [name] started 07/25/2023, today is cycle 6    Chest discomfort: Secondary to malignancy.     [name] toxicities: Fatigue: multifactorial related to chemo and pacemaker  Hypomagnesemia: Oral magnesium supplementation  Diarrhea.    PET/CT 11/29/2023: Progression of nodal metastases in the cervical region and supraclavicular region.  Hypermetabolic right axillary lymph node and soft tissue metastases in the subcutaneous fat of the right upper back.  Regression of the anterior mediastinal mass with near complete regression of metabolic activity.  Activity of the porta hepatis.   radiation pneumonitis: I sent a prescription for albuterol nebulizer and a nebulizer machine. Return to clinic for Trodelvy treatments  Liver Metastasis MRI planned to evaluate liver metastasis, requiring hospital setting due to pacemaker. - Coordinate with Adelina Mings to schedule MRI at the hospital (patient has a pacemaker)  I discussed with her alternative treatments include Enhertu based upon destiny breast 06 data. Return to clinic every 3 weeks for treatments and  plan to obtain CT CAP scans in April.  No orders of the defined types were placed in this encounter.  The patient has a good understanding of the overall plan. she agrees with it. she will call with any problems that may develop before the next visit here. Total time spent: 30 mins including face to face time and time spent for planning, charting and co-ordination of care   [place]  Marcelline Mates, MD 12/13/23

## 2023-12-19 ENCOUNTER — Other Ambulatory Visit: Payer: 59

## 2023-12-19 ENCOUNTER — Ambulatory Visit: Payer: 59 | Admitting: Adult Health

## 2023-12-19 ENCOUNTER — Inpatient Hospital Stay: Payer: Medicare Other | Admitting: Nurse Practitioner

## 2023-12-19 ENCOUNTER — Ambulatory Visit: Payer: 59

## 2023-12-19 NOTE — Progress Notes (Deleted)
 Palliative Medicine [facility]  [phone] Fax:([phone]) [phone]   Name: Jessica Simpson Date: 12/19/2023 MRN: [id number]  DOB: June 07, 1971  Patient Care Team: Arlina Robes, MD as PCP - General (Family Medicine) Serena Croissant, MD as Consulting Physician (Hematology and Oncology) Pickenpack-Cousar, Arty Baumgartner, NP as Nurse Practitioner [facility] and Palliative Medicine)    INTERVAL HISTORY: Jessica Simpson is a 53 y.o. 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  Palliative ask to see for symptom management and goals of care.   SOCIAL HISTORY:     reports that she has never smoked. She has never used smokeless tobacco. She reports that she does not drink alcohol and does not use drugs.  ADVANCE DIRECTIVES:  None on file  CODE STATUS: Full code  PAST MEDICAL HISTORY: Past Medical History:  Diagnosis Date   BRCA1 negative 10/22/2011   BRCA2 negative 10/22/2011   Breast cancer, IDC, Left, Stage III, Triple negative 06/30/2011   Breast disorder    Contraceptive education 01/15/2016   Cough    Dyspnea    Family history of breast cancer    Fracture    right 4th toe   History of breast cancer 07/22/2014   History of radiation therapy 05/16/18- 06/22/18   Right Breast/ 50.4 Gy in 28 fractions. Right posterior axilla and SCV nodes/ 50.4 Gy in 28 fractions.    Hypertension    Lymphedema 06/15/2012   Lymphedema of arm    Papanicolaou smear of cervix with positive high risk human papilloma virus (HPV) test 12/25/2020   12/19/2020 repeat pap in 1 year per ASCCP guidelines, 5 year CIN3+risk is 2.25%   Personal history of chemotherapy    Personal history of radiation therapy    Positive fecal occult blood test 07/22/2014   Presence of permanent cardiac pacemaker    S/P radiation therapy 2013   50 gray in 25 fractions to the left breast,  supraclavicular, and axillary regions. She then received a boost to the left lumpectomy of 10 gray in 5 fractions. This was given at [facility] [place].   Sleep apnea    Status post chemotherapy Comp. 11/23/11   FEC and Taxotere    ALLERGIES:  is allergic to codeine and hydrocodone-acetaminophen.  MEDICATIONS:  Current Outpatient Medications  Medication Sig Dispense Refill   acetaminophen (TYLENOL) 500 MG tablet Take 500 mg by mouth every 6 (six) hours as needed. For pain (Patient not taking: Reported on 10/14/2023)     albuterol (ACCUNEB) 0.63 MG/3ML nebulizer solution Take 3 mLs (0.63 mg total) by nebulization every 6 (six) hours as needed for wheezing. 75 mL 12   amLODipine (NORVASC) 5 MG tablet Take 10 mg by mouth daily.  6   Cyanocobalamin (VITAMIN B-12) 5000 MCG TBDP Take 5,000 mcg by mouth daily.     ibuprofen (ADVIL) 800 MG tablet Take 1 tablet (800 mg total) by mouth every 8 (eight) hours as needed. (Patient not taking: Reported on 10/14/2023) 30 tablet 0   lidocaine-prilocaine (EMLA) cream Apply to affected area once 30 g 3   loratadine (CLARITIN) 10 MG tablet Take 10 mg by mouth daily as needed for allergies.     magnesium oxide (MAG-OX) 400 (240 Mg) MG tablet Take 1 tablet (400 mg total) by mouth daily. 30 tablet 1   metoprolol succinate (TOPROL XL) 25 MG 24 hr tablet Take 1 tablet (25 mg total) by mouth daily.  90 tablet 3   Omega-3 Fatty Acids (FISH OIL MAXIMUM STRENGTH) 1200 MG CPDR Take 1,200 mg by mouth daily.     ondansetron (ZOFRAN) 8 MG tablet Take 1 tablet (8 mg total) by mouth 2 (two) times daily. 60 tablet 6   prochlorperazine (COMPAZINE) 10 MG tablet Take 1 tablet (10 mg total) by mouth every 6 (six) hours as needed for nausea or vomiting. (Patient not taking: Reported on 10/14/2023) 30 tablet 6   Vitamin D, Ergocalciferol, (DRISDOL) 1.25 MG (50000 UNIT) CAPS capsule Take 1 capsule (50,000 Units total) by mouth once a week. 12 capsule 2   No  current facility-administered medications for this visit.    VITAL SIGNS: LMP 09/19/2018  There were no vitals filed for this visit.  Estimated body mass index is 31.62 kg/m as calculated from the following:   Height as of 12/13/23: 5\' 6"  (1.676 m).   Weight as of 12/13/23: 195 lb 14.4 oz (88.9 kg).   PERFORMANCE STATUS (ECOG) : 1 - Symptomatic but completely ambulatory   Physical Exam General: NAD Cardiovascular: regular rate and rhythm Pulmonary: clear ant fields Abdomen: soft, nontender, + bowel sounds Extremities: no edema, no joint deformities Skin: no rashes Neurological:   IMPRESSION:    We discussed Her current illness and what it means in the larger context of Her on-going co-morbidities. Natural disease trajectory and expectations were discussed.  I discussed the importance of continued conversation with family and their medical providers regarding overall plan of care and treatment options, ensuring decisions are within the context of the patients values and GOCs.  PLAN: Metastatic Breast Cancer Third recurrence with significant fatigue, managed nausea/vomiting, and intermittent diarrhea. Weight loss of 20+ pounds since May/August. -Continue current antiemetic and antidiarrheal medications per Oncology -Consider B12 injection during treatment or at home if not possible during treatment. Will evaluate B12 levels. -Introduce protein supplements such as Premier Protein shakes, starting with one a day and potentially increasing to two a day. -Continue Vitamin D and reintroduce Omega-3 supplements. -Check in a week to assess effectiveness of new interventions.  Emotional distress Expressed difficulty coping with persistent illness and impact on quality of life. -Offered emotional support and open communication. -Discussed potential need for medication to manage anxiety, particularly during holiday season.  Follow-up in 4-6 weeks or sooner if any new symptoms or  concerns arise.   Patient expressed understanding and was in agreement with this plan. She also understands that She can call the clinic at any time with any questions, concerns, or complaints.   Any controlled substances utilized were prescribed in the context of palliative care. PDMP has been reviewed.    Visit consisted of counseling and education dealing with the complex and emotionally intense issues of symptom management and palliative care in the setting of serious and potentially life-threatening illness.  Willette Alma, AGPCNP-BC  Palliative Medicine Team/[facility]

## 2023-12-23 ENCOUNTER — Telehealth: Payer: Self-pay | Admitting: Hematology and Oncology

## 2023-12-23 NOTE — Telephone Encounter (Signed)
 Scheduled appointments per WQ. Patient is aware of the made appointments.

## 2023-12-26 ENCOUNTER — Ambulatory Visit: Admitting: Nurse Practitioner

## 2023-12-26 ENCOUNTER — Telehealth: Payer: Self-pay | Admitting: Nurse Practitioner

## 2023-12-26 MED FILL — Fosaprepitant Dimeglumine For IV Infusion 150 MG (Base Eq): INTRAVENOUS | Qty: 5 | Status: AC

## 2023-12-26 NOTE — Telephone Encounter (Signed)
 Jessica Simpson

## 2023-12-26 NOTE — Progress Notes (Deleted)
 Palliative Medicine [facility]  Telephone:(336) [phone] Fax:(336) [phone]   Name: Jessica Simpson Date: 12/26/2023 MRN: [id number]  DOB: 04/28/1971  Patient Care Team: Arlina Robes, MD as PCP - General (Family Medicine) Serena Croissant, MD as Consulting Physician (Hematology and Oncology) Pickenpack-Cousar, Arty Baumgartner, NP as Nurse Practitioner [facility] and Palliative Medicine)    INTERVAL HISTORY: Jessica Simpson is a 53 y.o. 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  Palliative ask to see for symptom management and goals of care.   SOCIAL HISTORY:     reports that she has never smoked. She has never used smokeless tobacco. She reports that she does not drink alcohol and does not use drugs.  ADVANCE DIRECTIVES:  None on file  CODE STATUS: Full code  PAST MEDICAL HISTORY: Past Medical History:  Diagnosis Date  . BRCA1 negative 10/22/2011  . BRCA2 negative 10/22/2011  . Breast cancer, IDC, Left, Stage III, Triple negative 06/30/2011  . Breast disorder   . Contraceptive education 01/15/2016  . Cough   . Dyspnea   . Family history of breast cancer   . Fracture    right 4th toe  . History of breast cancer 07/22/2014  . History of radiation therapy 05/16/18- 06/22/18   Right Breast/ 50.4 Gy in 28 fractions. Right posterior axilla and SCV nodes/ 50.4 Gy in 28 fractions.   . Hypertension   . Lymphedema 06/15/2012  . Lymphedema of arm   . Papanicolaou smear of cervix with positive high risk human papilloma virus (HPV) test 12/25/2020   12/19/2020 repeat pap in 1 year per ASCCP guidelines, 5 year CIN3+risk is 2.25%  . Personal history of chemotherapy   . Personal history of radiation therapy   . Positive fecal occult blood test 07/22/2014  . Presence of permanent cardiac pacemaker   . S/P radiation therapy 2013   50 gray in 25 fractions to the left  breast, supraclavicular, and axillary regions. She then received a boost to the left lumpectomy of 10 gray in 5 fractions. This was given at [facility] [place].  . Sleep apnea   . Status post chemotherapy Comp. 11/23/11   FEC and Taxotere    ALLERGIES:  is allergic to codeine and hydrocodone-acetaminophen.  MEDICATIONS:  Current Outpatient Medications  Medication Sig Dispense Refill  . acetaminophen (TYLENOL) 500 MG tablet Take 500 mg by mouth every 6 (six) hours as needed. For pain (Patient not taking: Reported on 10/14/2023)    . albuterol (ACCUNEB) 0.63 MG/3ML nebulizer solution Take 3 mLs (0.63 mg total) by nebulization every 6 (six) hours as needed for wheezing. 75 mL 12  . amLODipine (NORVASC) 5 MG tablet Take 10 mg by mouth daily.  6  . Cyanocobalamin (VITAMIN B-12) 5000 MCG TBDP Take 5,000 mcg by mouth daily.    Marland Kitchen ibuprofen (ADVIL) 800 MG tablet Take 1 tablet (800 mg total) by mouth every 8 (eight) hours as needed. (Patient not taking: Reported on 10/14/2023) 30 tablet 0  . lidocaine-prilocaine (EMLA) cream Apply to affected area once 30 g 3  . loratadine (CLARITIN) 10 MG tablet Take 10 mg by mouth daily as needed for allergies.    . magnesium oxide (MAG-OX) 400 (240 Mg) MG tablet Take 1 tablet (400 mg total) by mouth daily. 30 tablet 1  . metoprolol succinate (TOPROL XL) 25 MG 24 hr tablet Take 1 tablet (25 mg total) by mouth daily.  90 tablet 3  . Omega-3 Fatty Acids (FISH OIL MAXIMUM STRENGTH) 1200 MG CPDR Take 1,200 mg by mouth daily.    . ondansetron (ZOFRAN) 8 MG tablet Take 1 tablet (8 mg total) by mouth 2 (two) times daily. 60 tablet 6  . prochlorperazine (COMPAZINE) 10 MG tablet Take 1 tablet (10 mg total) by mouth every 6 (six) hours as needed for nausea or vomiting. (Patient not taking: Reported on 10/14/2023) 30 tablet 6  . Vitamin D, Ergocalciferol, (DRISDOL) 1.25 MG (50000 UNIT) CAPS capsule Take 1 capsule (50,000 Units total) by mouth once a week.  12 capsule 2   No current facility-administered medications for this visit.    VITAL SIGNS: LMP 09/19/2018  There were no vitals filed for this visit.  Estimated body mass index is 31.62 kg/m as calculated from the following:   Height as of 12/13/23: 5\' 6"  (1.676 m).   Weight as of 12/13/23: 195 lb 14.4 oz (88.9 kg).   PERFORMANCE STATUS (ECOG) : 1 - Symptomatic but completely ambulatory   Physical Exam General: NAD Cardiovascular: regular rate and rhythm Pulmonary: clear ant fields Abdomen: soft, nontender, + bowel sounds Extremities: no edema, no joint deformities Skin: no rashes Neurological:   IMPRESSION:    We discussed Her current illness and what it means in the larger context of Her on-going co-morbidities. Natural disease trajectory and expectations were discussed.  I discussed the importance of continued conversation with family and their medical providers regarding overall plan of care and treatment options, ensuring decisions are within the context of the patients values and GOCs.  PLAN: Metastatic Breast Cancer Third recurrence with significant fatigue, managed nausea/vomiting, and intermittent diarrhea. Weight loss of 20+ pounds since May/August. -Continue current antiemetic and antidiarrheal medications per Oncology -Consider B12 injection during treatment or at home if not possible during treatment. Will evaluate B12 levels. -Introduce protein supplements such as Premier Protein shakes, starting with one a day and potentially increasing to two a day. -Continue Vitamin D and reintroduce Omega-3 supplements. -Check in a week to assess effectiveness of new interventions.  Emotional distress Expressed difficulty coping with persistent illness and impact on quality of life. -Offered emotional support and open communication. -Discussed potential need for medication to manage anxiety, particularly during holiday season.  Follow-up in 4-6 weeks or sooner if any  new symptoms or concerns arise.   Patient expressed understanding and was in agreement with this plan. She also understands that She can call the clinic at any time with any questions, concerns, or complaints.   Any controlled substances utilized were prescribed in the context of palliative care. PDMP has been reviewed.    Visit consisted of counseling and education dealing with the complex and emotionally intense issues of symptom management and palliative care in the setting of serious and potentially life-threatening illness.  Willette Alma, AGPCNP-BC  Palliative Medicine Team/[facility]

## 2023-12-27 ENCOUNTER — Inpatient Hospital Stay: Payer: Medicare Other | Attending: Hematology and Oncology

## 2023-12-27 ENCOUNTER — Inpatient Hospital Stay: Payer: Medicare Other | Admitting: Adult Health

## 2023-12-27 ENCOUNTER — Inpatient Hospital Stay: Payer: Medicare Other

## 2023-12-27 ENCOUNTER — Encounter: Payer: Self-pay | Admitting: Hematology

## 2023-12-27 ENCOUNTER — Inpatient Hospital Stay: Attending: Hematology and Oncology | Admitting: Nurse Practitioner

## 2023-12-27 VITALS — BP 120/70 | HR 118 | Temp 97.6°F | Resp 18 | Wt 201.5 lb

## 2023-12-27 VITALS — HR 114

## 2023-12-27 DIAGNOSIS — R112 Nausea with vomiting, unspecified: Secondary | ICD-10-CM | POA: Insufficient documentation

## 2023-12-27 DIAGNOSIS — D701 Agranulocytosis secondary to cancer chemotherapy: Secondary | ICD-10-CM | POA: Insufficient documentation

## 2023-12-27 DIAGNOSIS — C787 Secondary malignant neoplasm of liver and intrahepatic bile duct: Secondary | ICD-10-CM | POA: Diagnosis not present

## 2023-12-27 DIAGNOSIS — T451X5A Adverse effect of antineoplastic and immunosuppressive drugs, initial encounter: Secondary | ICD-10-CM | POA: Diagnosis not present

## 2023-12-27 DIAGNOSIS — C50012 Malignant neoplasm of nipple and areola, left female breast: Secondary | ICD-10-CM | POA: Insufficient documentation

## 2023-12-27 DIAGNOSIS — C50919 Malignant neoplasm of unspecified site of unspecified female breast: Secondary | ICD-10-CM

## 2023-12-27 DIAGNOSIS — R053 Chronic cough: Secondary | ICD-10-CM | POA: Insufficient documentation

## 2023-12-27 DIAGNOSIS — C771 Secondary and unspecified malignant neoplasm of intrathoracic lymph nodes: Secondary | ICD-10-CM

## 2023-12-27 DIAGNOSIS — Y842 Radiological procedure and radiotherapy as the cause of abnormal reaction of the patient, or of later complication, without mention of misadventure at the time of the procedure: Secondary | ICD-10-CM | POA: Insufficient documentation

## 2023-12-27 DIAGNOSIS — Z5112 Encounter for antineoplastic immunotherapy: Secondary | ICD-10-CM | POA: Insufficient documentation

## 2023-12-27 DIAGNOSIS — Z95828 Presence of other vascular implants and grafts: Secondary | ICD-10-CM

## 2023-12-27 DIAGNOSIS — Z79899 Other long term (current) drug therapy: Secondary | ICD-10-CM | POA: Insufficient documentation

## 2023-12-27 DIAGNOSIS — J700 Acute pulmonary manifestations due to radiation: Secondary | ICD-10-CM | POA: Insufficient documentation

## 2023-12-27 DIAGNOSIS — Z923 Personal history of irradiation: Secondary | ICD-10-CM | POA: Diagnosis not present

## 2023-12-27 DIAGNOSIS — R197 Diarrhea, unspecified: Secondary | ICD-10-CM | POA: Diagnosis not present

## 2023-12-27 DIAGNOSIS — N6331 Unspecified lump in axillary tail of the right breast: Secondary | ICD-10-CM

## 2023-12-27 DIAGNOSIS — F40240 Claustrophobia: Secondary | ICD-10-CM | POA: Insufficient documentation

## 2023-12-27 DIAGNOSIS — G4730 Sleep apnea, unspecified: Secondary | ICD-10-CM | POA: Diagnosis not present

## 2023-12-27 LAB — CBC WITH DIFFERENTIAL (CANCER CENTER ONLY)
Abs Immature Granulocytes: 0.02 10*3/uL (ref 0.00–0.07)
Basophils Absolute: 0 10*3/uL (ref 0.0–0.1)
Basophils Relative: 0 %
Eosinophils Absolute: 0 10*3/uL (ref 0.0–0.5)
Eosinophils Relative: 2 %
HCT: 34.6 % — ABNORMAL LOW (ref 36.0–46.0)
Hemoglobin: 11.3 g/dL — ABNORMAL LOW (ref 12.0–15.0)
Immature Granulocytes: 1 %
Lymphocytes Relative: 13 %
Lymphs Abs: 0.3 10*3/uL — ABNORMAL LOW (ref 0.7–4.0)
MCH: 28.9 pg (ref 26.0–34.0)
MCHC: 32.7 g/dL (ref 30.0–36.0)
MCV: 88.5 fL (ref 80.0–100.0)
Monocytes Absolute: 0.3 10*3/uL (ref 0.1–1.0)
Monocytes Relative: 12 %
Neutro Abs: 1.7 10*3/uL (ref 1.7–7.7)
Neutrophils Relative %: 72 %
Platelet Count: 195 10*3/uL (ref 150–400)
RBC: 3.91 MIL/uL (ref 3.87–5.11)
RDW: 18.1 % — ABNORMAL HIGH (ref 11.5–15.5)
WBC Count: 2.3 10*3/uL — ABNORMAL LOW (ref 4.0–10.5)
nRBC: 0 % (ref 0.0–0.2)

## 2023-12-27 LAB — CMP (CANCER CENTER ONLY)
ALT: 9 U/L (ref 0–44)
AST: 11 U/L — ABNORMAL LOW (ref 15–41)
Albumin: 4 g/dL (ref 3.5–5.0)
Alkaline Phosphatase: 82 U/L (ref 38–126)
Anion gap: 7 (ref 5–15)
BUN: 15 mg/dL (ref 6–20)
CO2: 29 mmol/L (ref 22–32)
Calcium: 9.2 mg/dL (ref 8.9–10.3)
Chloride: 103 mmol/L (ref 98–111)
Creatinine: 0.91 mg/dL (ref 0.44–1.00)
GFR, Estimated: >60 mL/min (ref >60)
Glucose, Bld: 105 mg/dL — ABNORMAL HIGH (ref 70–99)
Potassium: 3.6 mmol/L (ref 3.5–5.1)
Sodium: 139 mmol/L (ref 135–145)
Total Bilirubin: 0.4 mg/dL (ref 0.0–1.2)
Total Protein: 7 g/dL (ref 6.5–8.1)

## 2023-12-27 LAB — MAGNESIUM: Magnesium: 1.9 mg/dL (ref 1.7–2.4)

## 2023-12-27 LAB — PHOSPHORUS: Phosphorus: 3.3 mg/dL (ref 2.5–4.6)

## 2023-12-27 MED ORDER — ATROPINE SULFATE 1 MG/ML IV SOLN
0.5000 mg | Freq: Once | INTRAVENOUS | Status: AC | PRN
  Administered 2023-12-27: 0.5 mg via INTRAVENOUS
  Filled 2023-12-27: qty 1

## 2023-12-27 MED ORDER — ACETAMINOPHEN 325 MG PO TABS
650.0000 mg | ORAL_TABLET | Freq: Once | ORAL | Status: AC
  Administered 2023-12-27: 650 mg via ORAL
  Filled 2023-12-27: qty 2

## 2023-12-27 MED ORDER — SODIUM CHLORIDE 0.9 % IV SOLN
Freq: Once | INTRAVENOUS | Status: AC
Start: 2023-12-27 — End: 2023-12-27

## 2023-12-27 MED ORDER — DIPHENOXYLATE-ATROPINE 2.5-0.025 MG PO TABS: 1.0000 | ORAL_TABLET | Freq: Four times a day (QID) | ORAL | 0 refills | Status: DC | PRN

## 2023-12-27 MED ORDER — PALONOSETRON HCL INJECTION 0.25 MG/5ML
0.2500 mg | Freq: Once | INTRAVENOUS | Status: AC
  Administered 2023-12-27: 0.25 mg via INTRAVENOUS
  Filled 2023-12-27: qty 5

## 2023-12-27 MED ORDER — SODIUM CHLORIDE 0.9% FLUSH
10.0000 mL | INTRAVENOUS | Status: DC | PRN
  Administered 2023-12-27: 10 mL

## 2023-12-27 MED ORDER — HEPARIN SOD (PORK) LOCK FLUSH 100 UNIT/ML IV SOLN
500.0000 [IU] | Freq: Once | INTRAVENOUS | Status: AC | PRN
  Administered 2023-12-27: 500 [IU]

## 2023-12-27 MED ORDER — SODIUM CHLORIDE 0.9 % IV SOLN
150.0000 mg | Freq: Once | INTRAVENOUS | Status: AC
  Administered 2023-12-27: 150 mg via INTRAVENOUS
  Filled 2023-12-27: qty 150

## 2023-12-27 MED ORDER — DIPHENHYDRAMINE HCL 50 MG/ML IJ SOLN
50.0000 mg | Freq: Once | INTRAMUSCULAR | Status: AC
  Administered 2023-12-27: 50 mg via INTRAVENOUS
  Filled 2023-12-27: qty 1

## 2023-12-27 MED ORDER — FAMOTIDINE IN NACL 20-0.9 MG/50ML-% IV SOLN
20.0000 mg | Freq: Once | INTRAVENOUS | Status: AC
  Administered 2023-12-27: 20 mg via INTRAVENOUS
  Filled 2023-12-27: qty 50

## 2023-12-27 MED ORDER — SODIUM CHLORIDE 0.9 % IV SOLN
7.5000 mg/kg | Freq: Once | INTRAVENOUS | Status: AC
  Administered 2023-12-27: 720 mg via INTRAVENOUS
  Filled 2023-12-27: qty 72

## 2023-12-27 MED ORDER — SODIUM CHLORIDE 0.9% FLUSH
10.0000 mL | Freq: Once | INTRAVENOUS | Status: AC
  Administered 2023-12-27: 10 mL

## 2023-12-27 MED ORDER — DEXAMETHASONE SODIUM PHOSPHATE 10 MG/ML IJ SOLN
10.0000 mg | Freq: Once | INTRAMUSCULAR | Status: AC
  Administered 2023-12-27: 10 mg via INTRAVENOUS
  Filled 2023-12-27: qty 1

## 2023-12-27 NOTE — Progress Notes (Signed)
 [facility]   [phone]) [phone] [phone]   Clinic Follow up Note   Patient Care Team: Arlina Robes, MD as PCP - General (Family Medicine) Serena Croissant, MD as Consulting Physician (Hematology and Oncology) Pickenpack-Cousar, Arty Baumgartner, NP as Nurse Practitioner [facility] and Palliative Medicine)  Date of Service:  12/27/2023  CHIEF COMPLAINT: f/u of metastatic breast cancer  CURRENT THERAPY:  [name] on day 1 8, every 21 days    Assessment and Plan    Metastatic breast cancer Metastatic breast cancer on Trodelvy since September 2024 Experiencing diarrhea and nausea. Diarrhea managed with Imodium, 4-6 pills per day. Nausea managed with Zofran. White blood cell count slightly low but adequate for treatment. Hemoglobin at 11.3, satisfactory. Discussed Lomotil for diarrhea, noting potential side effects such as dry mouth, increased heart rate, and issues for those with glaucoma or bladder problems.  - Continue Trodelvy treatment. - Prescribe Lomotil for diarrhea, 1-2 tablets up to 4 times a day as needed. - Proceed with today's treatment as blood counts are adequate. - Schedule MRI for March 26 to assess liver. -next f/u with Doctor Pamelia Hoit on March 25.   Chemotherapy-induced neutropenia White blood cell count is 2.3, with normal neutrophil count at 1.7. Scheduled for Xazio injection to support white blood cell count. Confirmed timing of injection is appropriate and discussed alternative options like Ampro patch, which requires insurance approval and is suitable for regimens with at least two weeks off. - Administer Xazio injection as scheduled, ensuring it is given at least one day before the next treatment. - Evaluate insurance coverage for Ampro patch and consider if regimen allows.  Chronic cough Chronic cough, improved but persistent, likely exacerbated by chemotherapy and seasonal allergies. Currently using a nebulizer for management. -  Continue nebulizer treatment for cough management.     Plan -Lab reviewed, adequate for treatment, will proceed to cycle 8-day 1 Trodelvy today, she will return next week for day 8 chemo -Follow-up in 3 weeks with Dr. Pamelia Hoit before next cycle chemo -Restaging MRI scheduled for March 26    SUMMARY OF ONCOLOGIC HISTORY: 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)  06/30/2011 Initial Diagnosis   Breast cancer, IDC, Left, Stage III, Triple negative, 7.6 cm breast mass and palpable axillary mass   09/06/2011 Miscellaneous   BRCA 1 and 2: Negative   09/14/2011 - 11/23/2011 Neo-Adjuvant Chemotherapy   Dose dense FEC followed by dose dense Taxotere   12/21/2011 Surgery   Left lumpectomy: High-grade poorly differentiated IDC 1.7 cm with high-grade DCIS, margins negative, 3/7 lymph nodes positive, ER 0%, PR 0%, HER-2 negative ratio 1.33 T1CN1 stage IIb   12/31/2011 - 02/15/2012 Radiation Therapy   Radiation at [facility]   09/26/2017 Relapse/Recurrence   Left breast upper outer quadrant within the lumpectomy bed: Fibrosis no malignancy, right axillary lymph node biopsy metastatic high-grade carcinoma ER 0%, PR 0%, Ki-67 90%, HER-2 negative ratio 1.11 (similar to previous ductal carcinoma)   11/14/2017 - 03/06/2018 Neo-Adjuvant Chemotherapy   Neo-Adjuvant chemotherapy with Gemzar and carboplatin days 1 and 8 every 3 weeks   01/10/2018 Genetic Testing   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  The Hereditary Gene Panel offered by [facility] includes sequencing and/or deletion duplication testing of the following 47 genes: APC, ATM, AXIN2, BARD1, BMPR1A, BRCA1, BRCA2, BRIP1, CDH1, CDK4, CDKN2A (p14ARF), CDKN2A (p16INK4a), CHEK2, CTNNA1, DICER1, EPCAM (Deletion/duplication testing only), GREM1 (promoter region deletion/duplication testing only), KIT, MEN1, MLH1, MSH2,  MSH3, MSH6, MUTYH, NBN, NF1, NHTL1, PALB2, PDGFRA, PMS2, POLD1, POLE,  PTEN, RAD50, RAD51C, RAD51D, SDHB, SDHC, SDHD, SMAD4, SMARCA4. STK11, TP53, TSC1, TSC2, and VHL.  The following genes were evaluated for sequence changes only: SDHA and HOXB13 c.251G>A variant only. The report date is January 10, 2018.    05/31/2023 Procedure   Mediastinal mass biopsy: Metastatic poorly differentiated carcinoma compatible with breast primary ER +60%, PR 0%, HER2 0, Ki-67 40%    06/02/2023 PET scan   PET/CT Anterior mediastinal mass is hypermetabolic and centrally necrotic, soft tissue lesions anterior to the heart, 10 mm pleural-based lesion, level 2 cervical nodes, hypermetabolism hepatic dome mottled FDG accumulation throughout the skeletal system    06/06/2023 - 07/14/2023 Anti-estrogen oral therapy   Antiestrogen therapy with Verzenio and letrozole    07/07/2023 Relapse/Recurrence   Increasing mediastinal mass 11.6 cm (used to be 10.8 cm) effacing the heart and involving the sternum, additional mediastinal/pericardial lymph nodes increased right extra lymph node 1.5 cm, nodules 0.2 cm (used to be 0.8 cm), few new scattered lung nodules)   07/25/2023 -  Chemotherapy   Patient is on Treatment Plan : BREAST METASTATIC Sacituzumab govitecan-hziy [name]) D1,8 q21d     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)  04/06/2018 Surgery   Right axillary lymph node dissection: 1/11 node positive for metastatic high-grade carcinoma   04/25/2018 Initial Diagnosis   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)   04/25/2018 Cancer Staging   Staging form: Breast, AJCC 8th Edition - Clinical: Stage IIB (cT0, cN1, cM0, G3, ER-, PR-, HER2-) - Signed by Lonie Peak, MD on 04/25/2018   05/16/2018 - 06/22/2018 Radiation Therapy   Adjuvant radiation therapy with Xeloda   07/11/2018 - 10/24/2018 Chemotherapy   Adjuvant chemotherapy with CMF      Discussed the use of AI scribe software for clinical note transcription with the patient, who gave verbal consent to proceed.  History  of Present Illness   The patient, with a history of metastatic breast cancer, presents for a follow-up visit. She has been on [place] since last September. The patient reports experiencing significant diarrhea and vomiting as side effects of the treatment, which she manages with Imodium and Zofran. She takes up to four Imodium pills on some days, but has not exceeded five or six. The patient also reports a persistent cough, which she believes has worsened since starting chemotherapy. She has seasonal allergies and has been put on a nebulizer recently.         All other systems were reviewed with the patient and are negative.  MEDICAL HISTORY:  Past Medical History:  Diagnosis Date   BRCA1 negative 10/22/2011   BRCA2 negative 10/22/2011   Breast cancer, IDC, Left, Stage III, Triple negative 06/30/2011   Breast disorder    Contraceptive education 01/15/2016   Cough    Dyspnea    Family history of breast cancer    Fracture    right 4th toe   History of breast cancer 07/22/2014   History of radiation therapy 05/16/18- 06/22/18   Right Breast/ 50.4 Gy in 28 fractions. Right posterior axilla and SCV nodes/ 50.4 Gy in 28 fractions.    Hypertension    Lymphedema 06/15/2012   Lymphedema of arm    Papanicolaou smear of cervix with positive high risk human papilloma virus (HPV) test 12/25/2020   12/19/2020 repeat pap in 1 year per ASCCP guidelines, 5 year CIN3+risk is 2.25%   Personal history of  chemotherapy    Personal history of radiation therapy    Positive fecal occult blood test 07/22/2014   Presence of permanent cardiac pacemaker    S/P radiation therapy 2013   50 gray in 25 fractions to the left breast, supraclavicular, and axillary regions. She then received a boost to the left lumpectomy of 10 gray in 5 fractions. This was given at [facility] [place].   Sleep apnea    Status post chemotherapy Comp. 11/23/11   FEC and Taxotere    SURGICAL HISTORY: Past  Surgical History:  Procedure Laterality Date   anal ascess     turned into a fistula with extensive treatment   AXILLARY LYMPH NODE BIOPSY Right 04/06/2018   AXILLARY LYMPH NODE DISSECTION Right 04/06/2018   Procedure: RIGHT AXILLARY LYMPH NODE DISSECTION;  Surgeon: Emelia Loron, MD;  Location: [facility] OR;  Service: General;  Laterality: Right;   BREAST BIOPSY Left 2012   BREAST BIOPSY Right 2019   BREAST LUMPECTOMY Left 2012   BREAST LUMPECTOMY Right 2019   rt axilla   BREAST SURGERY     CARDIAC ELECTROPHYSIOLOGY MAPPING AND ABLATION     CESAREAN SECTION     COLONOSCOPY N/A 08/14/2014   Procedure: COLONOSCOPY;  Surgeon: Malissa Hippo, MD;  [facility]: [facility];  Service: Endoscopy;  Laterality: N/A;  930   EVACUATION BREAST HEMATOMA  12/21/2011   Procedure: EVACUATION HEMATOMA BREAST;  Surgeon: Almond Lint, MD;  Location: [facility] OR;  Service: General;  Laterality: Left;   HAND SURGERY     tumor on finger, left hand   INCISION AND DRAINAGE ABSCESS ANAL     x3   IR IMAGING GUIDED PORT INSERTION  08/01/2023   left breast biopsy with axillary biopsy     core bx   PORT-A-CATH REMOVAL  12/21/2011   Procedure: REMOVAL PORT-A-CATH;  Surgeon: Currie Paris, MD;  Location: [facility];  Service: General;  Laterality: N/A;   PORTACATH PLACEMENT  08/02/2011   dr Lenord Carbo PLACEMENT Right 11/14/2017   Procedure: INSERTION PORT-A-CATH WITH [place];  Surgeon: Emelia Loron, MD;  Location: [facility] OR;  Service: General;  Laterality: Right;   removal breast mass  2004   benign   TREATMENT FISTULA ANAL     TUMOR REMOVAL     on left hand   TUMOR REMOVAL     rt. eye    I have reviewed the social history and family history with the patient and they are unchanged from previous note.  ALLERGIES:  is allergic to codeine and hydrocodone-acetaminophen.  MEDICATIONS:  Current Outpatient Medications  Medication Sig Dispense Refill   acetaminophen (TYLENOL) 500 MG tablet  Take 500 mg by mouth every 6 (six) hours as needed. For pain     albuterol (ACCUNEB) 0.63 MG/3ML nebulizer solution Take 3 mLs (0.63 mg total) by nebulization every 6 (six) hours as needed for wheezing. 75 mL 12   amLODipine (NORVASC) 5 MG tablet Take 10 mg by mouth daily.  6   Cyanocobalamin (VITAMIN B-12) 5000 MCG TBDP Take 5,000 mcg by mouth daily.     diphenoxylate-atropine (LOMOTIL) 2.5-0.025 MG tablet Take 1-2 tablets by mouth 4 (four) times daily as needed for diarrhea or loose stools. 60 tablet 0   ibuprofen (ADVIL) 800 MG tablet Take 1 tablet (800 mg total) by mouth every 8 (eight) hours as needed. 30 tablet 0   lidocaine-prilocaine (EMLA) cream Apply to affected area once 30 g 3  loratadine (CLARITIN) 10 MG tablet Take 10 mg by mouth daily as needed for allergies.     magnesium oxide (MAG-OX) 400 (240 Mg) MG tablet Take 1 tablet (400 mg total) by mouth daily. 30 tablet 1   metoprolol succinate (TOPROL XL) 25 MG 24 hr tablet Take 1 tablet (25 mg total) by mouth daily. 90 tablet 3   Omega-3 Fatty Acids (FISH OIL MAXIMUM STRENGTH) 1200 MG CPDR Take 1,200 mg by mouth daily.     ondansetron (ZOFRAN) 8 MG tablet Take 1 tablet (8 mg total) by mouth 2 (two) times daily. 60 tablet 6   prochlorperazine (COMPAZINE) 10 MG tablet Take 1 tablet (10 mg total) by mouth every 6 (six) hours as needed for nausea or vomiting. 30 tablet 6   Vitamin D, Ergocalciferol, (DRISDOL) 1.25 MG (50000 UNIT) CAPS capsule Take 1 capsule (50,000 Units total) by mouth once a week. 12 capsule 2   No current facility-administered medications for this visit.    PHYSICAL EXAMINATION: ECOG PERFORMANCE STATUS: 1 - Symptomatic but completely ambulatory  Vitals:   12/27/23 1026  BP: 120/70  Pulse: (!) 118  Resp: 18  Temp: 97.6 F (36.4 C)  SpO2: 98%   Wt Readings from Last 3 Encounters:  12/27/23 201 lb 8 oz (91.4 kg)  12/13/23 195 lb 14.4 oz (88.9 kg)  12/06/23 198 lb 8 oz (90 kg)     GENERAL:alert, no distress  and comfortable SKIN: skin color, texture, turgor are normal, no rashes or significant lesions EYES: normal, Conjunctiva are pink and non-injected, sclera clear Musculoskeletal:no cyanosis of digits and no clubbing  NEURO: alert & oriented x 3 with fluent speech, no focal motor/sensory deficits      LABORATORY DATA:  I have reviewed the data as listed    Latest Ref Rng & Units 12/27/2023   10:02 AM 12/13/2023    8:45 AM 12/06/2023    8:28 AM  CBC  WBC 4.0 - 10.5 K/uL 2.3  4.1  2.9   Hemoglobin 12.0 - 15.0 g/dL 40.9  81.1  91.4   Hematocrit 36.0 - 46.0 % 34.6  35.3  33.7   Platelets 150 - 400 K/uL 195  209  196         Latest Ref Rng & Units 12/27/2023   10:02 AM 12/13/2023    8:45 AM 12/06/2023    8:28 AM  CMP  Glucose 70 - 99 mg/dL 782  956  213   BUN 6 - 20 mg/dL 15  13  14    Creatinine 0.44 - 1.00 mg/dL 0.86  5.78  4.69   Sodium 135 - 145 mmol/L 139  139  138   Potassium 3.5 - 5.1 mmol/L 3.6  3.5  3.7   Chloride 98 - 111 mmol/L 103  103  102   CO2 22 - 32 mmol/L 29  29  29    Calcium 8.9 - 10.3 mg/dL 9.2  9.4  9.1   Total Protein 6.5 - 8.1 g/dL 7.0  6.8  6.9   Total Bilirubin 0.0 - 1.2 mg/dL 0.4  0.3  0.3   Alkaline Phos 38 - 126 U/L 82  91  83   AST 15 - 41 U/L 11  11  13    ALT 0 - 44 U/L 9  10  13        RADIOGRAPHIC STUDIES: I have personally reviewed the radiological images as listed and agreed with the findings in the report. No results found.  No orders of the defined types were placed in this encounter.  All questions were answered. The patient knows to call the clinic with any problems, questions or concerns. No barriers to learning was detected. The total time spent in the appointment was 25 minutes.     Malachy Mood, MD 12/27/2023

## 2023-12-27 NOTE — Patient Instructions (Signed)
 [facility]  Discharge Instructions: Thank you for choosing [facility] to provide your oncology and hematology care.   If you have a lab appointment with the Cancer Center, please go directly to the Cancer Center and check in at the registration area.   Wear comfortable clothing and clothing appropriate for easy access to any Portacath or PICC line.   We strive to give you quality time with your provider. You may need to reschedule your appointment if you arrive late (15 or more minutes).  Arriving late affects you and other patients whose appointments are after yours.  Also, if you miss three or more appointments without notifying the office, you may be dismissed from the clinic at the provider's discretion.      For prescription refill requests, have your pharmacy contact our office and allow 72 hours for refills to be completed.    Today you received the following chemotherapy and/or immunotherapy agents: [name]      To help prevent nausea and vomiting after your treatment, we encourage you to take your nausea medication as directed.  BELOW ARE SYMPTOMS THAT SHOULD BE REPORTED IMMEDIATELY: *FEVER GREATER THAN 100.4 F (38 C) OR HIGHER *CHILLS OR SWEATING *NAUSEA AND VOMITING THAT IS NOT CONTROLLED WITH YOUR NAUSEA MEDICATION *UNUSUAL SHORTNESS OF BREATH *UNUSUAL BRUISING OR BLEEDING *URINARY PROBLEMS (pain or burning when urinating, or frequent urination) *BOWEL PROBLEMS (unusual diarrhea, constipation, pain near the anus) TENDERNESS IN MOUTH AND THROAT WITH OR WITHOUT PRESENCE OF ULCERS (sore throat, sores in mouth, or a toothache) UNUSUAL RASH, SWELLING OR PAIN  UNUSUAL VAGINAL DISCHARGE OR ITCHING   Items with * indicate a potential emergency and should be followed up as soon as possible or go to the Emergency Department if any problems should occur.  Please show the CHEMOTHERAPY ALERT CARD or IMMUNOTHERAPY  ALERT CARD at check-in to the Emergency Department and triage nurse.  Should you have questions after your visit or need to cancel or reschedule your appointment, please contact [facility]  Dept: [phone]  and follow the prompts.  Office hours are 8:00 a.m. to 4:30 p.m. Monday - Friday. Please note that voicemails left after 4:00 p.m. may not be returned until the following business day.  We are closed weekends and major holidays. You have access to a nurse at all times for urgent questions. Please call the main number to the clinic Dept: [phone] and follow the prompts.   For any non-urgent questions, you may also contact your provider using MyChart. We now offer e-Visits for anyone 66 and older to request care online for non-urgent symptoms. For details visit [email or web address].   Also download the MyChart app! Go to the app store, search "MyChart", open the app, select [place], and log in with your MyChart username and password.

## 2023-12-27 NOTE — Progress Notes (Signed)
 Per Dr. Mosetta Putt, OK to treat with pulse 114 and pending phosphorus level.

## 2023-12-27 NOTE — Progress Notes (Signed)
 Patient declined post infusion observation.  Tolerated treatment well without incident.  Ambulated to lobby.

## 2023-12-31 ENCOUNTER — Inpatient Hospital Stay: Payer: Medicare Other

## 2023-12-31 VITALS — BP 136/84 | HR 101 | Temp 98.0°F | Resp 16

## 2023-12-31 DIAGNOSIS — C50919 Malignant neoplasm of unspecified site of unspecified female breast: Secondary | ICD-10-CM

## 2023-12-31 DIAGNOSIS — N6331 Unspecified lump in axillary tail of the right breast: Secondary | ICD-10-CM

## 2023-12-31 DIAGNOSIS — C50012 Malignant neoplasm of nipple and areola, left female breast: Secondary | ICD-10-CM | POA: Diagnosis not present

## 2023-12-31 MED ORDER — FILGRASTIM-SNDZ 480 MCG/0.8ML IJ SOSY
480.0000 ug | PREFILLED_SYRINGE | Freq: Once | INTRAMUSCULAR | Status: AC
Start: 2023-12-31 — End: 2023-12-31
  Administered 2023-12-31: 480 ug via SUBCUTANEOUS

## 2024-01-02 ENCOUNTER — Inpatient Hospital Stay: Payer: Medicare Other

## 2024-01-02 ENCOUNTER — Other Ambulatory Visit: Payer: Self-pay

## 2024-01-02 VITALS — BP 131/76 | HR 95 | Temp 98.4°F | Resp 17

## 2024-01-02 DIAGNOSIS — C50919 Malignant neoplasm of unspecified site of unspecified female breast: Secondary | ICD-10-CM

## 2024-01-02 DIAGNOSIS — N6331 Unspecified lump in axillary tail of the right breast: Secondary | ICD-10-CM

## 2024-01-02 DIAGNOSIS — C50012 Malignant neoplasm of nipple and areola, left female breast: Secondary | ICD-10-CM | POA: Diagnosis not present

## 2024-01-02 DIAGNOSIS — Z95828 Presence of other vascular implants and grafts: Secondary | ICD-10-CM

## 2024-01-02 LAB — CMP (CANCER CENTER ONLY)
ALT: 9 U/L (ref 0–44)
AST: 11 U/L — ABNORMAL LOW (ref 15–41)
Albumin: 3.9 g/dL (ref 3.5–5.0)
Alkaline Phosphatase: 92 U/L (ref 38–126)
Anion gap: 5 (ref 5–15)
BUN: 19 mg/dL (ref 6–20)
CO2: 30 mmol/L (ref 22–32)
Calcium: 8.8 mg/dL — ABNORMAL LOW (ref 8.9–10.3)
Chloride: 103 mmol/L (ref 98–111)
Creatinine: 0.85 mg/dL (ref 0.44–1.00)
GFR, Estimated: >60 mL/min (ref >60)
Glucose, Bld: 105 mg/dL — ABNORMAL HIGH (ref 70–99)
Potassium: 3.6 mmol/L (ref 3.5–5.1)
Sodium: 138 mmol/L (ref 135–145)
Total Bilirubin: 0.3 mg/dL (ref 0.0–1.2)
Total Protein: 6.6 g/dL (ref 6.5–8.1)

## 2024-01-02 LAB — CBC WITH DIFFERENTIAL (CANCER CENTER ONLY)
Abs Immature Granulocytes: 0.32 10*3/uL — ABNORMAL HIGH (ref 0.00–0.07)
Basophils Absolute: 0 10*3/uL (ref 0.0–0.1)
Basophils Relative: 1 %
Eosinophils Absolute: 0.1 10*3/uL (ref 0.0–0.5)
Eosinophils Relative: 2 %
HCT: 33.5 % — ABNORMAL LOW (ref 36.0–46.0)
Hemoglobin: 10.8 g/dL — ABNORMAL LOW (ref 12.0–15.0)
Immature Granulocytes: 5 %
Lymphocytes Relative: 6 %
Lymphs Abs: 0.4 10*3/uL — ABNORMAL LOW (ref 0.7–4.0)
MCH: 28.6 pg (ref 26.0–34.0)
MCHC: 32.2 g/dL (ref 30.0–36.0)
MCV: 88.6 fL (ref 80.0–100.0)
Monocytes Absolute: 0.3 10*3/uL (ref 0.1–1.0)
Monocytes Relative: 5 %
Neutro Abs: 5 10*3/uL (ref 1.7–7.7)
Neutrophils Relative %: 81 %
Platelet Count: 167 10*3/uL (ref 150–400)
RBC: 3.78 MIL/uL — ABNORMAL LOW (ref 3.87–5.11)
RDW: 17.5 % — ABNORMAL HIGH (ref 11.5–15.5)
WBC Count: 6.1 10*3/uL (ref 4.0–10.5)
nRBC: 0 % (ref 0.0–0.2)

## 2024-01-02 LAB — PHOSPHORUS: Phosphorus: 3.7 mg/dL (ref 2.5–4.6)

## 2024-01-02 LAB — MAGNESIUM: Magnesium: 1.7 mg/dL (ref 1.7–2.4)

## 2024-01-02 MED ORDER — SODIUM CHLORIDE 0.9% FLUSH
10.0000 mL | INTRAVENOUS | Status: DC | PRN
  Administered 2024-01-02: 10 mL

## 2024-01-02 MED ORDER — SODIUM CHLORIDE 0.9% FLUSH
10.0000 mL | Freq: Once | INTRAVENOUS | Status: AC
  Administered 2024-01-02: 10 mL

## 2024-01-02 MED ORDER — ACETAMINOPHEN 325 MG PO TABS
650.0000 mg | ORAL_TABLET | Freq: Once | ORAL | Status: AC
  Administered 2024-01-02: 650 mg via ORAL
  Filled 2024-01-02: qty 2

## 2024-01-02 MED ORDER — ATROPINE SULFATE 1 MG/ML IV SOLN
0.5000 mg | Freq: Once | INTRAVENOUS | Status: AC | PRN
  Administered 2024-01-02: 0.5 mg via INTRAVENOUS
  Filled 2024-01-02: qty 1

## 2024-01-02 MED ORDER — DEXAMETHASONE SODIUM PHOSPHATE 10 MG/ML IJ SOLN
10.0000 mg | Freq: Once | INTRAMUSCULAR | Status: AC
  Administered 2024-01-02: 10 mg via INTRAVENOUS
  Filled 2024-01-02: qty 1

## 2024-01-02 MED ORDER — SODIUM CHLORIDE 0.9 % IV SOLN
150.0000 mg | Freq: Once | INTRAVENOUS | Status: AC
  Administered 2024-01-02: 150 mg via INTRAVENOUS
  Filled 2024-01-02: qty 150

## 2024-01-02 MED ORDER — FAMOTIDINE IN NACL 20-0.9 MG/50ML-% IV SOLN
20.0000 mg | Freq: Once | INTRAVENOUS | Status: AC
  Administered 2024-01-02: 20 mg via INTRAVENOUS
  Filled 2024-01-02: qty 50

## 2024-01-02 MED ORDER — SODIUM CHLORIDE 0.9 % IV SOLN
7.5000 mg/kg | Freq: Once | INTRAVENOUS | Status: AC
  Administered 2024-01-02: 720 mg via INTRAVENOUS
  Filled 2024-01-02: qty 72

## 2024-01-02 MED ORDER — PALONOSETRON HCL INJECTION 0.25 MG/5ML
0.2500 mg | Freq: Once | INTRAVENOUS | Status: AC
  Administered 2024-01-02: 0.25 mg via INTRAVENOUS
  Filled 2024-01-02: qty 5

## 2024-01-02 MED ORDER — HEPARIN SOD (PORK) LOCK FLUSH 100 UNIT/ML IV SOLN
500.0000 [IU] | Freq: Once | INTRAVENOUS | Status: AC | PRN
Start: 2024-01-02 — End: 2024-01-02
  Administered 2024-01-02: 500 [IU]

## 2024-01-02 MED ORDER — SODIUM CHLORIDE 0.9 % IV SOLN
Freq: Once | INTRAVENOUS | Status: AC
Start: 2024-01-02 — End: 2024-01-02

## 2024-01-02 MED ORDER — DIPHENHYDRAMINE HCL 50 MG/ML IJ SOLN
50.0000 mg | Freq: Once | INTRAMUSCULAR | Status: AC
  Administered 2024-01-02: 50 mg via INTRAVENOUS
  Filled 2024-01-02: qty 1

## 2024-01-02 NOTE — Patient Instructions (Signed)
 [facility]  Discharge Instructions: Thank you for choosing [facility] to provide your oncology and hematology care.   If you have a lab appointment with the Cancer Center, please go directly to the Cancer Center and check in at the registration area.   Wear comfortable clothing and clothing appropriate for easy access to any Portacath or PICC line.   We strive to give you quality time with your provider. You may need to reschedule your appointment if you arrive late (15 or more minutes).  Arriving late affects you and other patients whose appointments are after yours.  Also, if you miss three or more appointments without notifying the office, you may be dismissed from the clinic at the provider's discretion.      For prescription refill requests, have your pharmacy contact our office and allow 72 hours for refills to be completed.    Today you received the following chemotherapy and/or immunotherapy agents: [name]      To help prevent nausea and vomiting after your treatment, we encourage you to take your nausea medication as directed.  BELOW ARE SYMPTOMS THAT SHOULD BE REPORTED IMMEDIATELY: *FEVER GREATER THAN 100.4 F (38 C) OR HIGHER *CHILLS OR SWEATING *NAUSEA AND VOMITING THAT IS NOT CONTROLLED WITH YOUR NAUSEA MEDICATION *UNUSUAL SHORTNESS OF BREATH *UNUSUAL BRUISING OR BLEEDING *URINARY PROBLEMS (pain or burning when urinating, or frequent urination) *BOWEL PROBLEMS (unusual diarrhea, constipation, pain near the anus) TENDERNESS IN MOUTH AND THROAT WITH OR WITHOUT PRESENCE OF ULCERS (sore throat, sores in mouth, or a toothache) UNUSUAL RASH, SWELLING OR PAIN  UNUSUAL VAGINAL DISCHARGE OR ITCHING   Items with * indicate a potential emergency and should be followed up as soon as possible or go to the Emergency Department if any problems should occur.  Please show the CHEMOTHERAPY ALERT CARD or IMMUNOTHERAPY  ALERT CARD at check-in to the Emergency Department and triage nurse.  Should you have questions after your visit or need to cancel or reschedule your appointment, please contact [facility]  Dept: [phone]  and follow the prompts.  Office hours are 8:00 a.m. to 4:30 p.m. Monday - Friday. Please note that voicemails left after 4:00 p.m. may not be returned until the following business day.  We are closed weekends and major holidays. You have access to a nurse at all times for urgent questions. Please call the main number to the clinic Dept: [phone] and follow the prompts.   For any non-urgent questions, you may also contact your provider using MyChart. We now offer e-Visits for anyone 66 and older to request care online for non-urgent symptoms. For details visit [email or web address].   Also download the MyChart app! Go to the app store, search "MyChart", open the app, select [place], and log in with your MyChart username and password.

## 2024-01-02 NOTE — Progress Notes (Signed)
 Per Dr. Pamelia Hoit, OK to proceed with Tx today with pending phosphorus level.  Pt declined post-infusion observation.  Tolerated treatment well without incident.  Ambulated to lobby.

## 2024-01-09 ENCOUNTER — Other Ambulatory Visit: Payer: Self-pay | Admitting: Adult Health

## 2024-01-10 ENCOUNTER — Ambulatory Visit: Payer: 59 | Attending: Internal Medicine | Admitting: Internal Medicine

## 2024-01-10 ENCOUNTER — Encounter: Payer: Self-pay | Admitting: Internal Medicine

## 2024-01-10 VITALS — BP 112/70 | HR 106 | Ht 66.0 in | Wt 201.8 lb

## 2024-01-10 DIAGNOSIS — I442 Atrioventricular block, complete: Secondary | ICD-10-CM | POA: Diagnosis not present

## 2024-01-10 NOTE — Patient Instructions (Signed)
Medication Instructions:  Your physician recommends that you continue on your current medications as directed. Please refer to the Current Medication list given to you today. *If you need a refill on your cardiac medications before your next appointment, please call your pharmacy   Follow-Up: At [facility], you and your health needs are our priority.  As part of our continuing mission to provide you with exceptional heart care, we have created designated Provider Care Teams.  These Care Teams include your primary Cardiologist (physician) and Advanced Practice Providers (APPs -  Physician Assistants and Nurse Practitioners) who all work together to provide you with the care you need, when you need it.  We recommend signing up for the patient portal called "MyChart".  Sign up information is provided on this After Visit Summary.  MyChart is used to connect with patients for Virtual Visits (Telemedicine).  Patients are able to view lab/test results, encounter notes, upcoming appointments, etc.  Non-urgent messages can be sent to your provider as well.   To learn more about what you can do with MyChart, go to [email or web address].    Your next appointment:   1 year(s)  Provider:   Gregg Taylor, MD   

## 2024-01-10 NOTE — Progress Notes (Signed)
 HPI Ms. Jessica Simpson is referred for ongoing PM evaluation. She is a pleasant 53 yo woman with a h/o recurrent breast CA,s/p chemo and XRT. She developed heart block. She underwent PPM insertion. She c/o feeling more sob and weak. No syncope. She denies chest pain. She has had some swelling in her left arm and right arm. She has an indwelling port o cath. Allergies  Allergen Reactions   Codeine Nausea And Vomiting   Hydrocodone-Acetaminophen Itching, Nausea And Vomiting and Other (See Comments)    Lortab     Current Outpatient Medications  Medication Sig Dispense Refill   acetaminophen (TYLENOL) 500 MG tablet Take 500 mg by mouth every 6 (six) hours as needed. For pain     albuterol (ACCUNEB) 0.63 MG/3ML nebulizer solution Take 3 mLs (0.63 mg total) by nebulization every 6 (six) hours as needed for wheezing. 75 mL 12   amLODipine (NORVASC) 5 MG tablet Take 10 mg by mouth daily.  6   Cyanocobalamin (VITAMIN B-12) 5000 MCG TBDP Take 5,000 mcg by mouth daily.     diphenoxylate-atropine (LOMOTIL) 2.5-0.025 MG tablet Take 1-2 tablets by mouth 4 (four) times daily as needed for diarrhea or loose stools. 60 tablet 0   ibuprofen (ADVIL) 800 MG tablet Take 1 tablet (800 mg total) by mouth every 8 (eight) hours as needed. 30 tablet 0   lidocaine-prilocaine (EMLA) cream Apply to affected area once 30 g 3   loratadine (CLARITIN) 10 MG tablet Take 10 mg by mouth daily as needed for allergies.     magnesium oxide (MAG-OX) 400 (240 Mg) MG tablet Take 1 tablet (400 mg total) by mouth daily. 30 tablet 1   metoprolol succinate (TOPROL XL) 25 MG 24 hr tablet Take 1 tablet (25 mg total) by mouth daily. 90 tablet 3   Omega-3 Fatty Acids (FISH OIL MAXIMUM STRENGTH) 1200 MG CPDR Take 1,200 mg by mouth daily.     ondansetron (ZOFRAN) 8 MG tablet Take 1 tablet (8 mg total) by mouth 2 (two) times daily. 60 tablet 6   prochlorperazine (COMPAZINE) 10 MG tablet Take 1 tablet (10 mg total) by mouth every 6 (six)  hours as needed for nausea or vomiting. 30 tablet 6   Vitamin D, Ergocalciferol, (DRISDOL) 1.25 MG (50000 UNIT) CAPS capsule Take 1 capsule (50,000 Units total) by mouth once a week. 12 capsule 2   No current facility-administered medications for this visit.     Past Medical History:  Diagnosis Date   BRCA1 negative 10/22/2011   BRCA2 negative 10/22/2011   Breast cancer, IDC, Left, Stage III, Triple negative 06/30/2011   Breast disorder    Contraceptive education 01/15/2016   Cough    Dyspnea    Family history of breast cancer    Fracture    right 4th toe   History of breast cancer 07/22/2014   History of radiation therapy 05/16/18- 06/22/18   Right Breast/ 50.4 Gy in 28 fractions. Right posterior axilla and SCV nodes/ 50.4 Gy in 28 fractions.    Hypertension    Lymphedema 06/15/2012   Lymphedema of arm    Papanicolaou smear of cervix with positive high risk human papilloma virus (HPV) test 12/25/2020   12/19/2020 repeat pap in 1 year per ASCCP guidelines, 5 year CIN3+risk is 2.25%   Personal history of chemotherapy    Personal history of radiation therapy    Positive fecal occult blood test 07/22/2014   Presence of permanent cardiac pacemaker  S/P radiation therapy 2013   50 gray in 25 fractions to the left breast, supraclavicular, and axillary regions. She then received a boost to the left lumpectomy of 10 gray in 5 fractions. This was given at [facility] [place].   Sleep apnea    Status post chemotherapy Comp. 11/23/11   FEC and Taxotere    ROS:   All systems reviewed and negative except as noted in the HPI.   Past Surgical History:  Procedure Laterality Date   anal ascess     turned into a fistula with extensive treatment   AXILLARY LYMPH NODE BIOPSY Right 04/06/2018   AXILLARY LYMPH NODE DISSECTION Right 04/06/2018   Procedure: RIGHT AXILLARY LYMPH NODE DISSECTION;  Surgeon: Emelia Loron, MD;  Location: [facility] OR;  Service: General;   Laterality: Right;   BREAST BIOPSY Left 2012   BREAST BIOPSY Right 2019   BREAST LUMPECTOMY Left 2012   BREAST LUMPECTOMY Right 2019   rt axilla   BREAST SURGERY     CARDIAC ELECTROPHYSIOLOGY MAPPING AND ABLATION     CESAREAN SECTION     COLONOSCOPY N/A 08/14/2014   Procedure: COLONOSCOPY;  Surgeon: Malissa Hippo, MD;  [facility];  Service: Endoscopy;  Laterality: N/A;  930   EVACUATION BREAST HEMATOMA  12/21/2011   Procedure: EVACUATION HEMATOMA BREAST;  Surgeon: Almond Lint, MD;  Location: [facility] OR;  Service: General;  Laterality: Left;   HAND SURGERY     tumor on finger, left hand   INCISION AND DRAINAGE ABSCESS ANAL     x3   IR IMAGING GUIDED PORT INSERTION  08/01/2023   left breast biopsy with axillary biopsy     core bx   PORT-A-CATH REMOVAL  12/21/2011   Procedure: REMOVAL PORT-A-CATH;  Surgeon: Currie Paris, MD;  Location: [facility];  Service: General;  Laterality: N/A;   PORTACATH PLACEMENT  08/02/2011   dr Lenord Carbo PLACEMENT Right 11/14/2017   Procedure: INSERTION PORT-A-CATH WITH [place];  Surgeon: Emelia Loron, MD;  Location: [name] OR;  Service: General;  Laterality: Right;   removal breast mass  2004   benign   TREATMENT FISTULA ANAL     TUMOR REMOVAL     on left hand   TUMOR REMOVAL     rt. eye     Family History  Problem Relation Age of Onset   Cancer Father 6       lung cancer    Cancer Maternal Grandmother        breast   Cancer Maternal Grandfather    Breast cancer Other        MGMs mother   Colon cancer Neg Hx      Social History   Socioeconomic History   Marital status: Single    Spouse name: Not on file   Number of children: Not on file   Years of education: Not on file   Highest education level: Not on file  Occupational History   Not on file  Tobacco Use   Smoking status: Never   Smokeless tobacco: Never  Vaping Use   Vaping status: Never Used  Substance and Sexual Activity   Alcohol  use: No   Drug use: No   Sexual activity: Yes    Birth control/protection: Post-menopausal  Other Topics Concern   Not on file  Social History Narrative   Not on file   Social Drivers of Health   Financial Resource Strain: Low Risk  (  01/26/2022)   Overall Financial Resource Strain (CARDIA)    Difficulty of Paying Living Expenses: Not very hard  Food Insecurity: No Food Insecurity (09/29/2023)   Hunger Vital Sign    Worried About Running Out of Food in the Last Year: Never true    Ran Out of Food in the Last Year: Never true  Transportation Needs: No Transportation Needs (09/29/2023)   PRAPARE - Administrator, Civil Service (Medical): No    Lack of Transportation (Non-Medical): No  Physical Activity: Insufficiently Active (01/26/2022)   Exercise Vital Sign    Days of Exercise per Week: 3 days    Minutes of Exercise per Session: 30 min  Stress: No Stress Concern Present (01/26/2022)   [employer] of Occupational Health - Occupational Stress Questionnaire    Feeling of Stress : Only a little  Social Connections: Moderately Integrated (01/26/2022)   Social Connection and Isolation Panel [NHANES]    Frequency of Communication with Friends and Family: More than three times a week    Frequency of Social Gatherings with Friends and Family: Once a week    Attends Religious Services: More than 4 times per year    Active Member of [employer] or Organizations: Yes    Attends Engineer, structural: More than 4 times per year    Marital Status: Never married  Intimate Partner Violence: Not At Risk (09/29/2023)   Humiliation, Afraid, Rape, and Kick questionnaire    Fear of Current or Ex-Partner: No    Emotionally Abused: No    Physically Abused: No    Sexually Abused: No     BP 112/70   Pulse (!) 106   Ht 5\' 6"  (1.676 m)   Wt 201 lb 12.8 oz (91.5 kg)   LMP 09/19/2018   SpO2 99%   BMI 32.57 kg/m   Physical Exam:  Well appearing middle aged woman, NAD HEENT:  Unremarkable Neck:  No JVD, no thyromegally Lymphatics:  No adenopathy Back:  No CVA tenderness Lungs:  Clear with no wheezes HEART:  Regular rate rhythm, no murmurs, no rubs, no clicks Abd:  soft, positive bowel sounds, no organomegally, no rebound, no guarding Ext:  2 plus pulses, no edema, no cyanosis, no clubbing Skin:  No rashes no nodules Neuro:  CN II through XII intact, motor grossly intact  DEVICE  Normal device function.  See PaceArt for details.   Assess/Plan:  CHB - she has no escape today but is asymptomatic s/p PPM insertion.  Sinus tachycardia - she will continue low dose toprol. 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    Jessica Gowda Bailynn Dyk,MD

## 2024-01-11 NOTE — Therapy (Signed)
 OUTPATIENT PHYSICAL THERAPY  UPPER EXTREMITY ONCOLOGY EVALUATION  Patient Name: Saaya Procell MRN: [id number] DOB:05-28-1971, 53 y.o., female Today's Date: 01/13/2024  END OF SESSION:  PT End of Session - 01/13/24 1117     Visit Number 1    Number of Visits 13    Date for PT Re-Evaluation 03/09/24    Authorization Type needed [employer]    PT Start Time 1400    PT Stop Time 1455    PT Time Calculation (min) 55 min    Activity Tolerance Patient tolerated treatment well    Behavior During Therapy [facility] for tasks assessed/performed             Past Medical History:  Diagnosis Date   BRCA1 negative 10/22/2011   BRCA2 negative 10/22/2011   Breast cancer, IDC, Left, Stage III, Triple negative 06/30/2011   Breast disorder    Contraceptive education 01/15/2016   Cough    Dyspnea    Family history of breast cancer    Fracture    right 4th toe   History of breast cancer 07/22/2014   History of radiation therapy 05/16/18- 06/22/18   Right Breast/ 50.4 Gy in 28 fractions. Right posterior axilla and SCV nodes/ 50.4 Gy in 28 fractions.    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 She then received a boost to the left lumpectomy of 10 gray in 5 fractions. This was given at [facility] [place].   Sleep apnea    Status post chemotherapy Comp. 11/23/11   FEC and Taxotere   Past Surgical History:  Procedure Laterality Date   anal ascess     turned into a fistula with extensive treatment    AXILLARY LYMPH NODE BIOPSY Right 04/06/2018   AXILLARY LYMPH NODE DISSECTION Right 04/06/2018   Procedure: RIGHT AXILLARY LYMPH NODE DISSECTION;  Surgeon: Emelia Loron, MD;  Location: [facility] OR;  Service: General;  Laterality: Right;   BREAST BIOPSY Left 2012   BREAST BIOPSY Right 2019   BREAST LUMPECTOMY Left 2012   BREAST LUMPECTOMY Right 2019   rt axilla   BREAST SURGERY     CARDIAC ELECTROPHYSIOLOGY MAPPING AND ABLATION     CESAREAN SECTION     COLONOSCOPY N/A 08/14/2014   Procedure: COLONOSCOPY;  Surgeon: Malissa Hippo, MD;  Location: [facility]ITE;  Service: Endoscopy;  Laterality: N/A;  930   EVACUATION BREAST HEMATOMA  12/21/2011   Procedure: EVACUATION HEMATOMA BREAST;  Surgeon: Almond Lint, MD;  Location: [facility] OR;  Service: General;  Laterality: Left;   HAND SURGERY     tumor on finger, left hand   INCISION AND DRAINAGE ABSCESS ANAL     x3   IR IMAGING GUIDED PORT INSERTION  08/01/2023   left breast biopsy with axillary biopsy     core bx   PORT-A-CATH REMOVAL  12/21/2011   Procedure: REMOVAL PORT-A-CATH;  Surgeon: Currie Paris, MD;  Location: [facility];  Service: General;  Laterality:  N/A;   PORTACATH PLACEMENT  08/02/2011   dr Jamey Ripa   PORTACATH PLACEMENT Right 11/14/2017   Procedure: INSERTION PORT-A-CATH WITH [place];  Surgeon: Emelia Loron, MD;  Location: [facility] OR;  Service: General;  Laterality: Right;   removal breast mass  2004   benign   TREATMENT FISTULA ANAL     TUMOR REMOVAL     on left hand   TUMOR REMOVAL     rt. 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   High degree atrioventricular block 11/15/2023   Secondary malignancy of mediastinal lymph nodes (HCC) 07/20/2023   PMB (postmenopausal bleeding) 01/26/2022   Papanicolaou smear of cervix with positive high risk human papilloma virus (HPV) test 12/25/2020   Routine medical exam 12/19/2020   Encounter for  screening fecal occult blood testing 12/19/2020   Hypertension 09/01/2018   Screening for colorectal cancer 09/01/2018   Encounter for gynecological examination with Papanicolaou smear of cervix 09/01/2018   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) 04/25/2018   Genetic testing 01/12/2018   Family history of breast cancer    Port-A-Cath in place 11/21/2017   Mass of axillary tail of right breast 08/24/2017   Pain with urination 08/24/2017   Urinary frequency 08/24/2017   Hematuria 08/24/2017   Encounter for well woman exam with routine gynecological exam 01/15/2016   Benign cyst of right breast 11/29/2015   Positive fecal occult blood test 07/22/2014   History of breast cancer 07/22/2014   Rectal bleeding 07/10/2014   Lymphedema 06/15/2012   BRCA1 negative 10/22/2011   BRCA2 negative 10/22/2011   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) 06/30/2011    Class: Diagnosis of    PCP: Ardean Larsen, MD  REFERRING PROVIDER: Serena Croissant, MD  REFERRING DIAG: lymphedema   THERAPY DIAG:  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, unspecified laterality (HCC)  Lymphedema  Pain in left arm  Aftercare following surgery for neoplasm  ONSET DATE: 2013  Rationale for Evaluation and Treatment: Rehabilitation  SUBJECTIVE:                                                                                                                                                                                           SUBJECTIVE STATEMENT: The left arm has been swollen and the Rt breast are swollen.  The right arm seems to be fine now.  I still have a flexitouch pump but I didn't use it for awhile  due to getting a pacemaker and having some back problems.  I  have started using it again.  I have one custom sleeve that is years old.  I do have a night garment but I don't feel like it works very well - it doesn't seem tight enough.    PERTINENT HISTORY:  Breast cancer Lt treated in 2012-2013 with chemotherapy/taxotere post lumpectomy and SLNB 3/7 nodes and radiation with onset of lymphedema during radiation, treatment of lymphedema at [facility] in 2013. Reoccurence in Rt axillary lymph node with chemotherapy carboplatin and gemcitabine x 6 cycles and ALND 1/11 positive, radiation completed 06/19/18 on the Rt. Recurrence 2024 in the Rt sternal region - had more radiation to the breast and sternal area on the Rt side ending 08/26/23.  OnTrodelvy. Has pacemaker.  Cough and SOB from mass near heart and disease status/medication.   11/29/23 PET: Nodal mets throughout cervical region and Lt supraclavicular region, Rt axillary node and fat of Rt upper back. Concerning areas in the liver increasing mediastinal mass 11.6cm effacing the heart and sternum, a few scattered lung nodules  PAIN:  Are you having pain? Yes NPRS scale: 0/10 in the breast, that will hurt when I lay on it up to 3/10 - 5/10 in in the left arm Pain location: Rt breast and Left arm Pain orientation: Bilateral  PAIN TYPE: aching, tight, and tingling Pain description: intermittent  Aggravating factors: using it Relieving factors: compression   PRECAUTIONS: active cancer, pacemaker, bil axillary node removal  RED FLAGS: None   WEIGHT BEARING RESTRICTIONS: No  FALLS:  Has patient fallen in last 6 months? No  LIVING ENVIRONMENT: Lives with: lives alone   OCCUPATION: not working  LEISURE: walking, things around the house   HAND DOMINANCE: right   PRIOR LEVEL OF FUNCTION: Independent with basic ADLs  PATIENT GOALS: Manage the hand swelling  and improve the breast.     OBJECTIVE: Note: Objective measures were completed at Evaluation unless otherwise noted.  COGNITION: Overall cognitive status: Within functional limits for tasks assessed   PALPATION: Rt breast is overall very tight with minimal skin movement, has a slight pink/red appearance, possible skin changes near  clavicle, no pitting in the Lt UE just in the back of the hand.   OBSERVATIONS / OTHER ASSESSMENTS: SOB with talking, intermittent cough  POSTURE: WNL  UPPER EXTREMITY AROM/PROM: WFL but pt reports she feels generally tight - will measure  A/PROM RIGHT   eval   Shoulder extension   Shoulder flexion   Shoulder abduction   Shoulder internal rotation   Shoulder external rotation     (Blank rows = not tested)  A/PROM LEFT   eval  Shoulder extension   Shoulder flexion   Shoulder abduction   Shoulder internal rotation   Shoulder external rotation     (Blank rows = not tested)  CERVICAL AROM: All within normal limits:   UPPER EXTREMITY STRENGTH: not tested   LYMPHEDEMA ASSESSMENTS:   LANDMARK RIGHT  eval  At axilla    15 cm proximal to olecranon process 38.5  10 cm proximal to olecranon process 37.1  Olecranon process 30.3  15 cm proximal to ulnar styloid process 29.3  10 cm proximal to ulnar styloid process 25.3  Just proximal to ulnar styloid process 19.9  Across hand at thumb web space 22.6  At base of 2nd digit 7.5  (Blank rows = not tested)  LANDMARK LEFT  eval  At axilla    15 cm proximal to olecranon process 38.5  10 cm proximal to olecranon process 38.5  Olecranon process 30.8  15 cm proximal to ulnar styloid process 28.3  10 cm proximal to ulnar styloid process 24.8  Just proximal to ulnar styloid process 20  Across hand at thumb web space 23.0  At base of 2nd digit 7.3  (Blank rows = not tested)   Right arm  Left arm  41ml difference   LLIS ; 36.76                                                                                                                            TREATMENT DATE:  01/13/24 Eval performed Discussed POC options include bandaging, learning to bandage, getting new garments Pt decided on wanting to learn to bandage and get new garments 01/25/24 with Rodman Pickle comes.   Gave prescription for bra from Dr. Pamelia Hoit and  discussed compression bra options and importance.     PATIENT EDUCATION:  Education details: per today's note Person educated: Patient Education method: Explanation Education comprehension: verbalized understanding  HOME EXERCISE PROGRAM:   ASSESSMENT:  CLINICAL IMPRESSION: Patient is a 53 y.o. female who was seen today for physical therapy evaluation and treatment for her worsening Left arm lymphedema and Rt breast lymphedema after radiation to the Rt mediastinal area due to tumor here.  She is complicated by lymph node removal in bil axillae, left SCF and mediastinal mass and enlarged nodes as well as enlarged nodes bil cervical regions.  She will benefit from getting compression for the chest to start.  She has a pump and has garments for both arms but they are a few years old.  Her arm measurements are generally similar except for the back of the hand and her volume is only 47ml difference so we discussed learning to bandage for self care while waiting for garments and pt was agreeable to this.  Will have to take care with MLD to direction Rt breast and Left arm to inguinals.    OBJECTIVE IMPAIRMENTS: decreased activity tolerance, decreased knowledge of condition, decreased knowledge of use of DME, increased edema, and increased fascial restrictions.   ACTIVITY LIMITATIONS: carrying, lifting, and reach over head  PARTICIPATION LIMITATIONS: cleaning and community activity  PERSONAL FACTORS: 3+ comorbidities: disease status, bil node removal radiation x 2  are also affecting patient's functional outcome.   REHAB POTENTIAL: Good  CLINICAL DECISION MAKING: Evolving/moderate complexity  EVALUATION COMPLEXITY: Moderate  GOALS: Goals reviewed with patient? Yes  SHORT TERM GOALS: Target date: 01/26/24  Pt will be compression for the breast and chest wall Baseline: Goal status: INITIAL  2.  Pt will be scheduled to get measured for new UE compression garments  Baseline:  Goal  status: INITIAL   LONG TERM GOALS: Target date: 03/09/24  Pt will be ind with self bandaging of the Lt arm Baseline:  Goal status: INITIAL  2.  Pt will return to use of the pump without need for device modifications (may need to change flow patterns) Baseline:  Goal  status: INITIAL  3.  Pt will report no resting pain in the Left hand  Baseline: 3-5/10 Goal status: INITIAL   PLAN:  PT FREQUENCY: 1-2x/week  PT DURATION: 6 weeks  PLANNED INTERVENTIONS: 97110-Therapeutic exercises, 97530- Therapeutic activity, 97535- Self Care, 29562- Manual therapy, 97760- Orthotic Fit/training, Patient/Family education, Taping, Therapeutic exercises, Therapeutic activity, Neuromuscular re-education, Gait training, and Self Care  PLAN FOR NEXT SESSION: get compression bra?, sign up for garments on 4/2, teach Lt UE bandaging (hand is the worst), Rt breast MLD and Lt arm MLD towards inguinals only - omit cervical work Engineer, maintenance (IT) about bil cancer program and how pt has to switch arms on the garment for each side   Idamae Lusher, PT 01/13/2024, 11:19 AM

## 2024-01-12 ENCOUNTER — Ambulatory Visit: Attending: Hematology and Oncology | Admitting: Rehabilitation

## 2024-01-12 DIAGNOSIS — C50012 Malignant neoplasm of nipple and areola, left female breast: Secondary | ICD-10-CM | POA: Diagnosis not present

## 2024-01-12 DIAGNOSIS — C771 Secondary and unspecified malignant neoplasm of intrathoracic lymph nodes: Secondary | ICD-10-CM | POA: Insufficient documentation

## 2024-01-12 DIAGNOSIS — C50919 Malignant neoplasm of unspecified site of unspecified female breast: Secondary | ICD-10-CM | POA: Diagnosis present

## 2024-01-12 DIAGNOSIS — M79602 Pain in left arm: Secondary | ICD-10-CM

## 2024-01-12 DIAGNOSIS — Z483 Aftercare following surgery for neoplasm: Secondary | ICD-10-CM

## 2024-01-12 DIAGNOSIS — I890 Lymphedema, not elsewhere classified: Secondary | ICD-10-CM | POA: Diagnosis present

## 2024-01-13 ENCOUNTER — Encounter: Payer: Self-pay | Admitting: Rehabilitation

## 2024-01-13 ENCOUNTER — Other Ambulatory Visit: Payer: Self-pay

## 2024-01-15 LAB — CUP PACEART INCLINIC DEVICE CHECK
Date Time Interrogation Session: 20250318131613
Implantable Lead Connection Status: 753985
Implantable Lead Connection Status: 753985
Implantable Lead Implant Date: 20240417
Implantable Lead Implant Date: 20240417
Implantable Lead Location: 753859
Implantable Lead Location: 753860
Implantable Lead Model: 3830
Implantable Lead Model: 5076
Implantable Pulse Generator Implant Date: 20240417

## 2024-01-16 MED FILL — Fosaprepitant Dimeglumine For IV Infusion 150 MG (Base Eq): INTRAVENOUS | Qty: 5 | Status: AC

## 2024-01-17 ENCOUNTER — Inpatient Hospital Stay: Admitting: Nurse Practitioner

## 2024-01-17 ENCOUNTER — Telehealth: Payer: Self-pay | Admitting: Nurse Practitioner

## 2024-01-17 ENCOUNTER — Inpatient Hospital Stay: Payer: Medicare Other

## 2024-01-17 ENCOUNTER — Inpatient Hospital Stay (HOSPITAL_BASED_OUTPATIENT_CLINIC_OR_DEPARTMENT_OTHER): Payer: Medicare Other | Admitting: Hematology and Oncology

## 2024-01-17 ENCOUNTER — Encounter: Payer: Self-pay | Admitting: Hematology and Oncology

## 2024-01-17 ENCOUNTER — Telehealth: Payer: Self-pay | Admitting: Hematology and Oncology

## 2024-01-17 VITALS — BP 139/83 | HR 103 | Temp 97.5°F | Resp 17 | Ht 66.0 in | Wt 203.1 lb

## 2024-01-17 VITALS — BP 147/84 | HR 100

## 2024-01-17 DIAGNOSIS — N6331 Unspecified lump in axillary tail of the right breast: Secondary | ICD-10-CM | POA: Diagnosis not present

## 2024-01-17 DIAGNOSIS — C771 Secondary and unspecified malignant neoplasm of intrathoracic lymph nodes: Secondary | ICD-10-CM | POA: Diagnosis not present

## 2024-01-17 DIAGNOSIS — C50012 Malignant neoplasm of nipple and areola, left female breast: Secondary | ICD-10-CM | POA: Diagnosis not present

## 2024-01-17 DIAGNOSIS — C50919 Malignant neoplasm of unspecified site of unspecified female breast: Secondary | ICD-10-CM

## 2024-01-17 DIAGNOSIS — Z95828 Presence of other vascular implants and grafts: Secondary | ICD-10-CM

## 2024-01-17 LAB — CBC WITH DIFFERENTIAL (CANCER CENTER ONLY)
Abs Immature Granulocytes: 0.01 10*3/uL (ref 0.00–0.07)
Basophils Absolute: 0 10*3/uL (ref 0.0–0.1)
Basophils Relative: 1 %
Eosinophils Absolute: 0 10*3/uL (ref 0.0–0.5)
Eosinophils Relative: 3 %
HCT: 33.7 % — ABNORMAL LOW (ref 36.0–46.0)
Hemoglobin: 10.7 g/dL — ABNORMAL LOW (ref 12.0–15.0)
Immature Granulocytes: 1 %
Lymphocytes Relative: 19 %
Lymphs Abs: 0.3 10*3/uL — ABNORMAL LOW (ref 0.7–4.0)
MCH: 28.2 pg (ref 26.0–34.0)
MCHC: 31.8 g/dL (ref 30.0–36.0)
MCV: 88.7 fL (ref 80.0–100.0)
Monocytes Absolute: 0.2 10*3/uL (ref 0.1–1.0)
Monocytes Relative: 15 %
Neutro Abs: 1 10*3/uL — ABNORMAL LOW (ref 1.7–7.7)
Neutrophils Relative %: 61 %
Platelet Count: 150 10*3/uL (ref 150–400)
RBC: 3.8 MIL/uL — ABNORMAL LOW (ref 3.87–5.11)
RDW: 17.1 % — ABNORMAL HIGH (ref 11.5–15.5)
WBC Count: 1.6 10*3/uL — ABNORMAL LOW (ref 4.0–10.5)
nRBC: 0 % (ref 0.0–0.2)

## 2024-01-17 LAB — CMP (CANCER CENTER ONLY)
ALT: 10 U/L (ref 0–44)
AST: 13 U/L — ABNORMAL LOW (ref 15–41)
Albumin: 4.1 g/dL (ref 3.5–5.0)
Alkaline Phosphatase: 95 U/L (ref 38–126)
Anion gap: 7 (ref 5–15)
BUN: 15 mg/dL (ref 6–20)
CO2: 31 mmol/L (ref 22–32)
Calcium: 9.2 mg/dL (ref 8.9–10.3)
Chloride: 102 mmol/L (ref 98–111)
Creatinine: 0.99 mg/dL (ref 0.44–1.00)
GFR, Estimated: >60 mL/min (ref >60)
Glucose, Bld: 104 mg/dL — ABNORMAL HIGH (ref 70–99)
Potassium: 3.4 mmol/L — ABNORMAL LOW (ref 3.5–5.1)
Sodium: 140 mmol/L (ref 135–145)
Total Bilirubin: 0.4 mg/dL (ref 0.0–1.2)
Total Protein: 7.1 g/dL (ref 6.5–8.1)

## 2024-01-17 LAB — MAGNESIUM: Magnesium: 1.9 mg/dL (ref 1.7–2.4)

## 2024-01-17 LAB — PHOSPHORUS: Phosphorus: 4.3 mg/dL (ref 2.5–4.6)

## 2024-01-17 MED ORDER — FAMOTIDINE IN NACL 20-0.9 MG/50ML-% IV SOLN
20.0000 mg | Freq: Once | INTRAVENOUS | Status: AC
  Administered 2024-01-17: 20 mg via INTRAVENOUS
  Filled 2024-01-17: qty 50

## 2024-01-17 MED ORDER — IBUPROFEN 800 MG PO TABS: 800.0000 mg | ORAL_TABLET | Freq: Three times a day (TID) | ORAL | 0 refills | Status: DC | PRN

## 2024-01-17 MED ORDER — HEPARIN SOD (PORK) LOCK FLUSH 100 UNIT/ML IV SOLN
500.0000 [IU] | Freq: Once | INTRAVENOUS | Status: AC | PRN
  Administered 2024-01-17: 500 [IU]

## 2024-01-17 MED ORDER — ACETAMINOPHEN 325 MG PO TABS
650.0000 mg | ORAL_TABLET | Freq: Once | ORAL | Status: AC
  Administered 2024-01-17: 650 mg via ORAL
  Filled 2024-01-17: qty 2

## 2024-01-17 MED ORDER — SODIUM CHLORIDE 0.9 % IV SOLN: Freq: Once | INTRAVENOUS | Status: AC

## 2024-01-17 MED ORDER — SACITUZUMAB GOVITECAN-HZIY CHEMO 180 MG IV SOLR
460.0000 mg | Freq: Once | INTRAVENOUS | Status: AC
  Administered 2024-01-17: 460 mg via INTRAVENOUS
  Filled 2024-01-17: qty 46

## 2024-01-17 MED ORDER — SODIUM CHLORIDE 0.9 % IV SOLN
150.0000 mg | Freq: Once | INTRAVENOUS | Status: AC
  Administered 2024-01-17: 150 mg via INTRAVENOUS
  Filled 2024-01-17: qty 150

## 2024-01-17 MED ORDER — DEXAMETHASONE SODIUM PHOSPHATE 10 MG/ML IJ SOLN
10.0000 mg | Freq: Once | INTRAMUSCULAR | Status: AC
  Administered 2024-01-17: 10 mg via INTRAVENOUS
  Filled 2024-01-17: qty 1

## 2024-01-17 MED ORDER — PALONOSETRON HCL INJECTION 0.25 MG/5ML
0.2500 mg | Freq: Once | INTRAVENOUS | Status: AC
  Administered 2024-01-17: 0.25 mg via INTRAVENOUS
  Filled 2024-01-17: qty 5

## 2024-01-17 MED ORDER — SODIUM CHLORIDE 0.9% FLUSH
10.0000 mL | INTRAVENOUS | Status: DC | PRN
  Administered 2024-01-17: 10 mL

## 2024-01-17 MED ORDER — SODIUM CHLORIDE 0.9% FLUSH
10.0000 mL | Freq: Once | INTRAVENOUS | Status: AC
  Administered 2024-01-17: 10 mL

## 2024-01-17 MED ORDER — LORAZEPAM 1 MG PO TABS: 1.0000 mg | ORAL_TABLET | Freq: Three times a day (TID) | ORAL | 0 refills | Status: DC

## 2024-01-17 MED ORDER — DIPHENHYDRAMINE HCL 50 MG/ML IJ SOLN
50.0000 mg | Freq: Once | INTRAMUSCULAR | Status: AC
  Administered 2024-01-17: 50 mg via INTRAVENOUS
  Filled 2024-01-17: qty 1

## 2024-01-17 MED ORDER — ATROPINE SULFATE 1 MG/ML IV SOLN
0.5000 mg | Freq: Once | INTRAVENOUS | Status: AC | PRN
  Administered 2024-01-17: 0.5 mg via INTRAVENOUS
  Filled 2024-01-17: qty 1

## 2024-01-17 NOTE — Progress Notes (Signed)
 Per Dr. Pamelia Hoit, OK to proceed with Tx today with pending phosphorus.

## 2024-01-17 NOTE — Telephone Encounter (Signed)
 Completed - Confirmed canceled appt request from patient. Appt has been canceled.

## 2024-01-17 NOTE — Progress Notes (Signed)
 Jessica Simpson 5mg /kg = 460mg .  Ok to update dose per Dr Pamelia Hoit

## 2024-01-17 NOTE — Assessment & Plan Note (Signed)
 Prior treatment: Verzinio with letrozole started 06/08/2023-07/20/2023   CT CAP: Increasing mediastinal mass 11.6 cm (used to be 10.8 cm) effacing the heart and involving the sternum, additional mediastinal/pericardial lymph nodes increased right extra lymph node 1.5 cm, nodules 0.2 cm (used to be 0.8 cm), few new scattered lung nodules)  CT CAP 09/20/2023: Decrease in the size of the anterior mediastinal mass that effaces the heart and invades the sternum/chest wall, decreased size of the pleural-based left lower lobe lung nodule.  Scattered pulmonary nodules not visualized.  Decreased hepatic metastases.  Stable T1    Treatment plan: [name] started 07/25/2023, today is cycle 9    Chest discomfort: Secondary to malignancy.     [name] toxicities: Fatigue: multifactorial related to chemo and pacemaker  Hypomagnesemia: Oral magnesium supplementation  Diarrhea.    PET/CT 11/29/2023: Progression of nodal metastases in the cervical region and supraclavicular region.  Hypermetabolic right axillary lymph node and soft tissue metastases in the subcutaneous fat of the right upper back.  Regression of the anterior mediastinal mass with near complete regression of metabolic activity.  Activity of the porta hepatis.    radiation pneumonitis: I sent a prescription for albuterol nebulizer and a nebulizer machine. Liver MRI scheduled for 01/18/2024

## 2024-01-17 NOTE — Progress Notes (Signed)
 Patient Care Team: Arlina Robes, MD as PCP - General (Family Medicine) Serena Croissant, MD as Consulting Physician (Hematology and Oncology) Pickenpack-Cousar, Arty Baumgartner, NP as Nurse Practitioner [facility] and Palliative Medicine)  DIAGNOSIS:  Encounter Diagnoses  Name Primary?   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)  06/30/2011 Initial Diagnosis   Breast cancer, IDC, Left, Stage III, Triple negative, 7.6 cm breast mass and palpable axillary mass   09/06/2011 Miscellaneous   BRCA 1 and 2: Negative   09/14/2011 - 11/23/2011 Neo-Adjuvant Chemotherapy   Dose dense FEC followed by dose dense Taxotere   12/21/2011 Surgery   Left lumpectomy: High-grade poorly differentiated IDC 1.7 cm with high-grade DCIS, margins negative, 3/7 lymph nodes positive, ER 0%, PR 0%, HER-2 negative ratio 1.33 T1CN1 stage IIb   12/31/2011 - 02/15/2012 Radiation Therapy   Radiation at [facility]   09/26/2017 Relapse/Recurrence   Left breast upper outer quadrant within the lumpectomy bed: Fibrosis no malignancy, right axillary lymph node biopsy metastatic high-grade carcinoma ER 0%, PR 0%, Ki-67 90%, HER-2 negative ratio 1.11 (similar to previous ductal carcinoma)   11/14/2017 - 03/06/2018 Neo-Adjuvant Chemotherapy   Neo-Adjuvant chemotherapy with Gemzar and carboplatin days 1 and 8 every 3 weeks   01/10/2018 Genetic Testing   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  The Hereditary Gene Panel offered by [facility] includes sequencing and/or deletion duplication testing of the following 47 genes: APC, ATM, AXIN2, BARD1, BMPR1A, BRCA1, BRCA2, BRIP1, CDH1, CDK4, CDKN2A (p14ARF), CDKN2A (p16INK4a), CHEK2, CTNNA1, DICER1, EPCAM (Deletion/duplication testing only), GREM1  (promoter region deletion/duplication testing only), KIT, MEN1, MLH1, MSH2, MSH3, MSH6, MUTYH, NBN, NF1, NHTL1, PALB2, PDGFRA, PMS2, POLD1, POLE, PTEN, RAD50, RAD51C, RAD51D, SDHB, SDHC, SDHD, SMAD4, SMARCA4. STK11, TP53, TSC1, TSC2, and VHL.  The following genes were evaluated for sequence changes only: SDHA and HOXB13 c.251G>A variant only. The report date is January 10, 2018.    05/31/2023 Procedure   Mediastinal mass biopsy: Metastatic poorly differentiated carcinoma compatible with breast primary ER +60%, PR 0%, HER2 0, Ki-67 40%    06/02/2023 PET scan   PET/CT Anterior mediastinal mass is hypermetabolic and centrally necrotic, soft tissue lesions anterior to the heart, 10 mm pleural-based lesion, level 2 cervical nodes, hypermetabolism hepatic dome mottled FDG accumulation throughout the skeletal system    06/06/2023 - 07/14/2023 Anti-estrogen oral therapy   Antiestrogen therapy with Verzenio and letrozole    07/07/2023 Relapse/Recurrence   Increasing mediastinal mass 11.6 cm (used to be 10.8 cm) effacing the heart and involving the sternum, additional mediastinal/pericardial lymph nodes increased right extra lymph node 1.5 cm, nodules 0.2 cm (used to be 0.8 cm), few new scattered lung nodules)   07/25/2023 -  Chemotherapy   Patient is on Treatment Plan : BREAST METASTATIC Sacituzumab govitecan-hziy [name]) D1,8 q21d     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)  04/06/2018 Surgery   Right axillary lymph node dissection: 1/11 node positive for metastatic high-grade carcinoma   04/25/2018 Initial Diagnosis   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)   04/25/2018 Cancer Staging   Staging form: Breast, AJCC 8th Edition - Clinical: Stage IIB (cT0, cN1, cM0, G3, ER-, PR-, HER2-) - Signed by Lonie Peak, MD on 04/25/2018   05/16/2018 - 06/22/2018 Radiation Therapy  Adjuvant radiation therapy with Xeloda   07/11/2018 - 10/24/2018 Chemotherapy   Adjuvant chemotherapy with CMF      CHIEF COMPLIANT: Follow-up on Trodelvy  HISTORY OF PRESENT ILLNESS:   History of Present Illness The patient, with a history of an metastatic breast cancer on [name]  presents for a follow-up visit. She reports an improvement in her condition, with less frequent episodes of vomiting and diarrhea. She also notes that she has more energy and is not as lethargic as before. The patient has been monitoring her lymph nodes and reports no changes. She also mentions a spot on her back that has been causing discomfort, but she believes it may be psychosomatic. The patient is scheduled for an MRI and expresses anxiety due to claustrophobia. She has been prescribed Zarxio to help maintain her white blood cell counts, which have been low due to the chemotherapy.     ALLERGIES:  is allergic to codeine and hydrocodone-acetaminophen.  MEDICATIONS:  Current Outpatient Medications  Medication Sig Dispense Refill   LORazepam (ATIVAN) 1 MG tablet Take 1 tablet (1 mg total) by mouth every 8 (eight) hours. 10 tablet 0   acetaminophen (TYLENOL) 500 MG tablet Take 500 mg by mouth every 6 (six) hours as needed. For pain     albuterol (ACCUNEB) 0.63 MG/3ML nebulizer solution Take 3 mLs (0.63 mg total) by nebulization every 6 (six) hours as needed for wheezing. 75 mL 12   amLODipine (NORVASC) 5 MG tablet Take 10 mg by mouth daily.  6   Cyanocobalamin (VITAMIN B-12) 5000 MCG TBDP Take 5,000 mcg by mouth daily.     diphenoxylate-atropine (LOMOTIL) 2.5-0.025 MG tablet Take 1-2 tablets by mouth 4 (four) times daily as needed for diarrhea or loose stools. 60 tablet 0   ibuprofen (ADVIL) 800 MG tablet Take 1 tablet (800 mg total) by mouth every 8 (eight) hours as needed. 30 tablet 0   lidocaine-prilocaine (EMLA) cream Apply to affected area once 30 g 3   loratadine (CLARITIN) 10 MG tablet Take 10 mg by mouth daily as needed for allergies.     magnesium oxide (MAG-OX) 400 MG tablet TAKE 1 TABLET BY MOUTH EVERY DAY 30  tablet 1   metoprolol succinate (TOPROL XL) 25 MG 24 hr tablet Take 1 tablet (25 mg total) by mouth daily. 90 tablet 3   Omega-3 Fatty Acids (FISH OIL MAXIMUM STRENGTH) 1200 MG CPDR Take 1,200 mg by mouth daily.     ondansetron (ZOFRAN) 8 MG tablet Take 1 tablet (8 mg total) by mouth 2 (two) times daily. 60 tablet 6   prochlorperazine (COMPAZINE) 10 MG tablet Take 1 tablet (10 mg total) by mouth every 6 (six) hours as needed for nausea or vomiting. 30 tablet 6   Vitamin D, Ergocalciferol, (DRISDOL) 1.25 MG (50000 UNIT) CAPS capsule Take 1 capsule (50,000 Units total) by mouth once a week. 12 capsule 2   No current facility-administered medications for this visit.    PHYSICAL EXAMINATION: ECOG PERFORMANCE STATUS: 1 - Symptomatic but completely ambulatory  Vitals:   01/17/24 0825  BP: 139/83  Pulse: (!) 103  Resp: 17  Temp: (!) 97.5 F (36.4 C)  SpO2: 100%   Filed Weights   01/17/24 0825  Weight: 203 lb 1.6 oz (92.1 kg)   Exam: Palpable bilateral cervical lymph nodes unchanged from before  LABORATORY DATA:  I have reviewed the data as listed    Latest Ref Rng & Units 01/02/2024    8:40 AM 12/27/2023  10:02 AM 12/13/2023    8:45 AM  CMP  Glucose 70 - 99 mg/dL 259  563  875   BUN 6 - 20 mg/dL 19  15  13    Creatinine 0.44 - 1.00 mg/dL 6.43  3.29  5.18   Sodium 135 - 145 mmol/L 138  139  139   Potassium 3.5 - 5.1 mmol/L 3.6  3.6  3.5   Chloride 98 - 111 mmol/L 103  103  103   CO2 22 - 32 mmol/L 30  29  29    Calcium 8.9 - 10.3 mg/dL 8.8  9.2  9.4   Total Protein 6.5 - 8.1 g/dL 6.6  7.0  6.8   Total Bilirubin 0.0 - 1.2 mg/dL 0.3  0.4  0.3   Alkaline Phos 38 - 126 U/L 92  82  91   AST 15 - 41 U/L 11  11  11    ALT 0 - 44 U/L 9  9  10      Lab Results  Component Value Date   WBC 1.6 (L) 01/17/2024   HGB 10.7 (L) 01/17/2024   HCT 33.7 (L) 01/17/2024   MCV 88.7 01/17/2024   PLT 150 01/17/2024   NEUTROABS 1.0 (L) 01/17/2024    ASSESSMENT & PLAN:  Breast cancer metastasized  to intrathoracic lymph node (HCC) Prior treatment: Verzinio with letrozole started 06/08/2023-07/20/2023   CT CAP: Increasing mediastinal mass 11.6 cm (used to be 10.8 cm) effacing the heart and involving the sternum, additional mediastinal/pericardial lymph nodes increased right extra lymph node 1.5 cm, nodules 0.2 cm (used to be 0.8 cm), few new scattered lung nodules)  CT CAP 09/20/2023: Decrease in the size of the anterior mediastinal mass that effaces the heart and invades the sternum/chest wall, decreased size of the pleural-based left lower lobe lung nodule.  Scattered pulmonary nodules not visualized.  Decreased hepatic metastases.  Stable T1    Treatment plan: [name] started 07/25/2023, today is cycle 9      Trodelvy toxicities: Fatigue: multifactorial related to chemo and pacemaker  Hypomagnesemia: Oral magnesium supplementation  Diarrhea.  Neutropenia: Patient gets Granix injections prior to each treatment.  Today's ANC is 1000.  I reduced the dosage of today's chemo and she is okay to proceed.  I added a Granix injection prior to every single treatment.   PET/CT 11/29/2023: Progression of nodal metastases in the cervical region and supraclavicular region.  Hypermetabolic right axillary lymph node and soft tissue metastases in the subcutaneous fat of the right upper back.  Regression of the anterior mediastinal mass with near complete regression of metabolic activity.  Activity of the porta hepatis.    radiation pneumonitis: I sent a prescription for albuterol nebulizer and a nebulizer machine. Liver MRI scheduled for 01/18/2024 CT neck and chest has been ordered for April 2025. 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 Neutrophils at 1.0 indicate borderline neutropenia. Hemoglobin at 10.7, platelets stable. Chemotherapy dose adjusted for low  white count. Zarxio required for WBC maintenance. - Administer Zarxio injection before each chemotherapy treatment. - Adjust chemotherapy dose for low white count. - Schedule chemotherapy and Zarxio injections.  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 MRI scheduled to assess and guide treatment. - Perform MRI of the liver. - Evaluate MRI results for treatment modification.  Trodelvy toxicities Improved tolerance to [place] with reduced nausea and diarrhea. - Manage side effects symptomatically.  Radiation pneumonitis Symptom improvement with  reduced cough attributed to pollen exposure. - Provide supportive care for respiratory symptoms.  Claustrophobia Claustrophobia during MRI requires anxiety management. Ativan prescribed. - Prescribe Ativan for MRI anxiety. Take one tablet an hour before and another half an hour before MRI if needed.  Follow-up Follow-up appointments and imaging scheduled to monitor condition. - Schedule CT scan of neck and chest around April 8th or 9th. - Arrange follow-up appointments post-imaging.      Orders Placed This Encounter  Procedures   CT Soft Tissue Neck W Contrast    Standing Status:   Future    Expected Date:   02/01/2024    Expiration Date:   01/16/2025    If indicated for the ordered procedure, I authorize the administration of contrast media per Radiology protocol:   Yes    Does the patient have a contrast media/X-ray dye allergy?:   No    Is patient pregnant?:   No    Preferred imaging location?:   [facility]    Release to patient:   Immediate [1]   CT Chest W Contrast    Standing Status:   Future    Expiration Date:   01/16/2025    If indicated for the ordered procedure, I authorize the administration of contrast media per Radiology protocol:   Yes    Does the patient have a contrast media/X-ray dye allergy?:   No    Is patient pregnant?:   No    Preferred imaging location?:   [facility]    Release  to patient:   Immediate [1]   CT Chest W Contrast    Standing Status:   Future    Expected Date:   02/01/2024    Expiration Date:   01/16/2025    If indicated for the ordered procedure, I authorize the administration of contrast media per Radiology protocol:   Yes    Does the patient have a contrast media/X-ray dye allergy?:   No    Is patient pregnant?:   No    Preferred imaging location?:   [facility]    Release to patient:   Immediate [1]   CBC with Differential (Cancer Center Only)    Standing Status:   Future    Expected Date:   02/06/2024    Expiration Date:   02/05/2025   CMP (Cancer Center only)    Standing Status:   Future    Expected Date:   02/06/2024    Expiration Date:   02/05/2025   Magnesium    Standing Status:   Future    Expected Date:   02/06/2024    Expiration Date:   02/05/2025   Phosphorus    Standing Status:   Future    Expected Date:   02/06/2024    Expiration Date:   02/05/2025   CBC with Differential (Cancer Center Only)    Standing Status:   Future    Expected Date:   02/13/2024    Expiration Date:   02/12/2025   CMP (Cancer Center only)    Standing Status:   Future    Expected Date:   02/13/2024    Expiration Date:   02/12/2025   Magnesium    Standing Status:   Future    Expected Date:   02/13/2024    Expiration Date:   02/12/2025   Phosphorus    Standing Status:   Future    Expected Date:   02/13/2024    Expiration Date:  02/12/2025   CBC with Differential (Cancer Center Only)    Standing Status:   Future    Expected Date:   02/27/2024    Expiration Date:   02/26/2025   CMP (Cancer Center only)    Standing Status:   Future    Expected Date:   02/27/2024    Expiration Date:   02/26/2025   Magnesium    Standing Status:   Future    Expected Date:   02/27/2024    Expiration Date:   02/26/2025   Phosphorus    Standing Status:   Future    Expected Date:   02/27/2024    Expiration Date:   02/26/2025   CBC with Differential (Cancer Center Only)    Standing  Status:   Future    Expected Date:   03/05/2024    Expiration Date:   03/05/2025   CMP (Cancer Center only)    Standing Status:   Future    Expected Date:   03/05/2024    Expiration Date:   03/05/2025   Magnesium    Standing Status:   Future    Expected Date:   03/05/2024    Expiration Date:   03/05/2025   Phosphorus    Standing Status:   Future    Expected Date:   03/05/2024    Expiration Date:   03/05/2025   The patient has a good understanding of the overall plan. she agrees with it. she will call with any problems that may develop before the next visit here. Total time spent: 30 mins including face to face time and time spent for planning, charting and co-ordination of care   Tamsen Meek, MD 01/17/24

## 2024-01-17 NOTE — Patient Instructions (Addendum)
 [facility] - A DEPT OF [facility]  Discharge Instructions: Thank you for choosing [facility] to provide your oncology and hematology care.   If you have a lab appointment with the [facility], please go directly to the [facility] and check in at the registration area.   Wear comfortable clothing and clothing appropriate for easy access to any Portacath or PICC line.   We strive to give you quality time with your provider. You may need to reschedule your appointment if you arrive late (15 or more minutes).  Arriving late affects you and other patients whose appointments are after yours.  Also, if you miss three or more appointments without notifying the office, you may be dismissed from the clinic at the provider's discretion.      For prescription refill requests, have your pharmacy contact our office and allow 72 hours for refills to be completed.    Today you received the following chemotherapy and/or immunotherapy agents: [name]      To help prevent nausea and vomiting after your treatment, we encourage you to take your nausea medication as directed.  BELOW ARE SYMPTOMS THAT SHOULD BE REPORTED IMMEDIATELY: *FEVER GREATER THAN 100.4 F (38 C) OR HIGHER *CHILLS OR SWEATING *NAUSEA AND VOMITING THAT IS NOT CONTROLLED WITH YOUR NAUSEA MEDICATION *UNUSUAL SHORTNESS OF BREATH *UNUSUAL BRUISING OR BLEEDING *URINARY PROBLEMS (pain or burning when urinating, or frequent urination) *BOWEL PROBLEMS (unusual diarrhea, constipation, pain near the anus) TENDERNESS IN MOUTH AND THROAT WITH OR WITHOUT PRESENCE OF ULCERS (sore throat, sores in mouth, or a toothache) UNUSUAL RASH, SWELLING OR PAIN  UNUSUAL VAGINAL DISCHARGE OR ITCHING   Items with * indicate a potential emergency and should be followed up as soon as possible or go to the Emergency Department if any problems should occur.  Please show the CHEMOTHERAPY ALERT CARD or IMMUNOTHERAPY  ALERT CARD at check-in to the Emergency Department and triage nurse.  Should you have questions after your visit or need to cancel or reschedule your appointment, please contact [facility] - A DEPT OF [facility]  Dept: [phone]  and follow the prompts.  Office hours are 8:00 a.m. to 4:30 p.m. Monday - Friday. Please note that voicemails left after 4:00 p.m. may not be returned until the following business day.  We are closed weekends and major holidays. You have access to a nurse at all times for urgent questions. Please call the main number to the clinic Dept: [phone] and follow the prompts.   For any non-urgent questions, you may also contact your provider using MyChart. We now offer e-Visits for anyone 18 and older to request care online for non-urgent symptoms. For details visit [email or web address].   Also download the MyChart app! Go to the app store, search "MyChart", open the app, select [facility], and log in with your MyChart username and password.  Neutropenia Neutropenia is a condition that occurs when you have low levels of neutrophils. Neutrophils are a type of white blood cells. They are made in the spongy [facility] of bones (bone marrow). They fight infections. Neutrophils are your body's main defense against infections. The fewer neutrophils you have and the longer your body remains without them, the greater your risk of getting a severe infection. What are the causes? This condition can occur if your body uses up or destroys neutrophils faster than your bone marrow can make them. Neutropenia may  be caused by: A bacterial or fungal infection. Allergic disorders. Reactions to some medicines. An autoimmune disease. An enlarged spleen. This condition can also occur if your bone marrow does not produce enough neutrophils. This problem may be caused by: Cancer. Cancer treatments, such as radiation or chemotherapy. Viral  infections. Medicines, such as phenytoin. Vitamin B12 deficiency. Diseases of the bone marrow. Environmental toxins, such as insecticides. What are the signs or symptoms? This condition does not usually cause symptoms. If symptoms are present, they are usually caused by an underlying infection. Symptoms of an infection may include: Fever. Chills. Swollen glands. Mouth ulcers. Cough. Rash or skin infection. Skin may be red, swollen, or painful. Abdominal or rectal pain. Frequent urination or pain or burning with urination. Because neutropenia weakens the immune system, symptoms of infection may be reduced. It is important to be aware of any changes in your body and talk to your health care provider. How is this diagnosed? This condition is diagnosed based on your medical history and a physical exam. Tests will also be done, such as: A complete blood count (CBC). Bone marrow biopsy. This is collecting a sample of bone marrow for testing. A chest X-ray. A urine culture. A blood culture. How is this treated? Treatment depends on the underlying cause and severity of your condition. Mild neutropenia may not require treatment. Treatment may include medicines, such as: Antibiotic medicine given through an IV. Antiviral medicines. Antifungal medicines. A medicine to increase production of neutrophils (colony-stimulating factor). You may get this medicine through an IV or by injection. Steroids given through an IV. If an underlying condition is causing neutropenia, you may need treatment for that condition. If medicines or cancer treatments are causing neutropenia, your health care provider may have you stop the medicines or treatment. Follow these instructions at home: Medicines  Take over-the-counter and prescription medicines only as told by your health care provider. Get an annual flu shot. Ask your health care provider whether you or anyone you live with needs any other  vaccines. Eating and drinking Do not share food utensils. Do not eat unpasteurized foods. Do not eat raw or undercooked meat, eggs, or seafood. Do not eat unwashed, raw fruits or vegetables. Lifestyle Avoid exposure to groups of people or children. Avoid being around people who are sick. Avoid being around live plants or fresh flowers. Avoid being around dirt or dust, such as in construction areas or gardens. Wear gloves if you are going to do yard work or gardening. Do not provide direct care for pets. Avoid animal droppings. Do not clean litter boxes and bird cages. Do not have sex unless your health care provider has approved. Hygiene  Bathe daily. Clean the area between the genitals and the anus (perineal area) after you urinate or have a bowel movement. If you are female, wipe from front to back. Get regular dental care and brush your teeth with a soft toothbrush before and after meals. Do not use a regular razor. Use an electric razor to remove hair. Wash your hands often with soap and water for at least 20 seconds. Make sure others who come in contact with you also wash their hands. If soap and water are not available, use hand sanitizer. General instructions Take steps to reduce your risk of injury or infection. Follow any precautions as told by your health care provider. Take actions to avoid cuts and burns. For example: Be cautious when you use knives. Always cut away from yourself. Keep knives in  protective sheaths or guards when not in use. Use oven mitts when you cook with a hot stove, oven, or grill. Stand a safe distance away from open fires. Do not use tampons, enemas, or rectal suppositories unless your health care provider has approved. Keep all follow-up visits. This is important. Contact a health care provider if: You have a cough. You have a sore throat. You develop sores in your mouth or anus. You have a warm, red, or tender area on your skin. You have red  streaks on the skin. You develop a rash. You have swollen lymph nodes. You have frequent or painful urination. You have vaginal discharge or itching. Get help right away if: You have a fever. You have chills or shaking. You have nausea or vomiting. You have a lot of fatigue. You have shortness of breath. Summary Neutropenia is a condition that occurs when you have a lower-than-normal level of a type of white blood cell (neutrophils) in your body. This condition can occur if your body uses up or destroys neutrophils faster than your bone marrow can make them. Treatment depends on the underlying cause and severity of your condition. Mild neutropenia may not require treatment. Follow any precautions as told by your health care provider to reduce your risk for injury or infection. This information is not intended to replace advice given to you by your health care provider. Make sure you discuss any questions you have with your health care provider. Document Revised: 04/08/2021 Document Reviewed: 04/08/2021 Elsevier Patient Education  2024 [employer].

## 2024-01-17 NOTE — Telephone Encounter (Signed)
 Scheduled appointments per WQ. Talked with the patient and she is aware of all made appointments.

## 2024-01-17 NOTE — Progress Notes (Signed)
 Patient declined post infusion observation.  Tolerated treatment well without incident.  Ambulated to lobby.

## 2024-01-18 ENCOUNTER — Ambulatory Visit (HOSPITAL_COMMUNITY)
Admission: RE | Admit: 2024-01-18 | Discharge: 2024-01-18 | Disposition: A | Discharge status: Home / Self Care | Payer: Medicare Other | Source: Ambulatory Visit | Attending: Hematology and Oncology | Admitting: Hematology and Oncology

## 2024-01-18 DIAGNOSIS — K769 Liver disease, unspecified: Secondary | ICD-10-CM | POA: Diagnosis present

## 2024-01-18 MED ORDER — GADOBUTROL 1 MMOL/ML IV SOLN
9.0000 mL | Freq: Once | INTRAVENOUS | Status: AC | PRN
  Administered 2024-01-18: 9 mL via INTRAVENOUS

## 2024-01-20 ENCOUNTER — Ambulatory Visit

## 2024-01-20 DIAGNOSIS — Z483 Aftercare following surgery for neoplasm: Secondary | ICD-10-CM

## 2024-01-20 DIAGNOSIS — I890 Lymphedema, not elsewhere classified: Secondary | ICD-10-CM

## 2024-01-20 DIAGNOSIS — C50919 Malignant neoplasm of unspecified site of unspecified female breast: Secondary | ICD-10-CM | POA: Diagnosis not present

## 2024-01-20 DIAGNOSIS — M79602 Pain in left arm: Secondary | ICD-10-CM

## 2024-01-20 MED FILL — Fosaprepitant Dimeglumine For IV Infusion 150 MG (Base Eq): INTRAVENOUS | Qty: 5 | Status: AC

## 2024-01-20 NOTE — Therapy (Signed)
 OUTPATIENT PHYSICAL THERAPY  UPPER EXTREMITY ONCOLOGY TREATMENT  Patient Name: Jessica Simpson MRN: [id number] DOB:05-Aug-1971, 53 y.o., female Today's Date: 01/20/2024  END OF SESSION:  PT End of Session - 01/20/24 1012     Visit Number 2    Number of Visits 13    Date for PT Re-Evaluation 03/09/24    Authorization Type needed [employer]    PT Start Time 1005    PT Stop Time 1100    PT Time Calculation (min) 55 min    Activity Tolerance Patient tolerated treatment well    Behavior During Therapy [facility] for tasks assessed/performed             Past Medical History:  Diagnosis Date   BRCA1 negative 10/22/2011   BRCA2 negative 10/22/2011   Breast cancer, IDC, Left, Stage III, Triple negative 06/30/2011   Breast disorder    Contraceptive education 01/15/2016   Cough    Dyspnea    Family history of breast cancer    Fracture    right 4th toe   History of breast cancer 07/22/2014   History of radiation therapy 05/16/18- 06/22/18   Right Breast/ 50.4 Gy in 28 fractions. Right posterior axilla and SCV nodes/ 50.4 Gy in 28 fractions.    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 She then received a boost to the left lumpectomy of 10 gray in 5 fractions. This was given at [facility] [place].   Sleep apnea    Status post chemotherapy Comp. 11/23/11   FEC and Taxotere   Past Surgical History:  Procedure Laterality Date   anal ascess     turned into a fistula with extensive treatment    AXILLARY LYMPH NODE BIOPSY Right 04/06/2018   AXILLARY LYMPH NODE DISSECTION Right 04/06/2018   Procedure: RIGHT AXILLARY LYMPH NODE DISSECTION;  Surgeon: Emelia Loron, MD;  Location: [facility] OR;  Service: General;  Laterality: Right;   BREAST BIOPSY Left 2012   BREAST BIOPSY Right 2019   BREAST LUMPECTOMY Left 2012   BREAST LUMPECTOMY Right 2019   rt axilla   BREAST SURGERY     CARDIAC ELECTROPHYSIOLOGY MAPPING AND ABLATION     CESAREAN SECTION     COLONOSCOPY N/A 08/14/2014   Procedure: COLONOSCOPY;  Surgeon: Malissa Hippo, MD;  Location: [facility];  Service: Endoscopy;  Laterality: N/A;  930   EVACUATION BREAST HEMATOMA  12/21/2011   Procedure: EVACUATION HEMATOMA BREAST;  Surgeon: Almond Lint, MD;  Location: [facility] OR;  Service: General;  Laterality: Left;   HAND SURGERY     tumor on finger, left hand   INCISION AND DRAINAGE ABSCESS ANAL     x3   IR IMAGING GUIDED PORT INSERTION  08/01/2023   left breast biopsy with axillary biopsy     core bx   PORT-A-CATH REMOVAL  12/21/2011   Procedure: REMOVAL PORT-A-CATH;  Surgeon: Currie Paris, MD;  Location: [facility];  Service: General;  Laterality:  N/A;   PORTACATH PLACEMENT  08/02/2011   dr Jamey Ripa   PORTACATH PLACEMENT Right 11/14/2017   Procedure: INSERTION PORT-A-CATH WITH [place];  Surgeon: Emelia Loron, MD;  Location: [name] OR;  Service: General;  Laterality: Right;   removal breast mass  2004   benign   TREATMENT FISTULA ANAL     TUMOR REMOVAL     on left hand   TUMOR REMOVAL     rt. 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   High degree atrioventricular block 11/15/2023   Secondary malignancy of mediastinal lymph nodes (HCC) 07/20/2023   PMB (postmenopausal bleeding) 01/26/2022   Papanicolaou smear of cervix with positive high risk human papilloma virus (HPV) test 12/25/2020   Routine medical exam 12/19/2020   Encounter for  screening fecal occult blood testing 12/19/2020   Hypertension 09/01/2018   Screening for colorectal cancer 09/01/2018   Encounter for gynecological examination with Papanicolaou smear of cervix 09/01/2018   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) 04/25/2018   Genetic testing 01/12/2018   Family history of breast cancer    Port-A-Cath in place 11/21/2017   Mass of axillary tail of right breast 08/24/2017   Pain with urination 08/24/2017   Urinary frequency 08/24/2017   Hematuria 08/24/2017   Encounter for well woman exam with routine gynecological exam 01/15/2016   Benign cyst of right breast 11/29/2015   Positive fecal occult blood test 07/22/2014   History of breast cancer 07/22/2014   Rectal bleeding 07/10/2014   Lymphedema 06/15/2012   BRCA1 negative 10/22/2011   BRCA2 negative 10/22/2011   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) 06/30/2011    Class: Diagnosis of    PCP: Ardean Larsen, MD  REFERRING PROVIDER: Serena Croissant, MD  REFERRING DIAG: lymphedema   THERAPY DIAG:  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, unspecified laterality (HCC)  Lymphedema  Pain in left arm  Aftercare following surgery for neoplasm  ONSET DATE: 2013  Rationale for Evaluation and Treatment: Rehabilitation  SUBJECTIVE:                                                                                                                                                                                           SUBJECTIVE STATEMENT: My breast is bothering me more so I want to start with focus on that because my old sleeve gives me some relief with my Lt arm swelling. I called [facility] and they are checking my insurance and supposed to call me back.  PERTINENT HISTORY: Breast cancer Lt treated in 2012-2013 with chemotherapy/taxotere post lumpectomy and SLNB 3/7 nodes and radiation with onset of lymphedema during radiation, treatment of  lymphedema at [facility] in 2013. Reoccurence in Rt axillary lymph node with chemotherapy carboplatin and gemcitabine x 6 cycles and ALND 1/11 positive, radiation completed 06/19/18 on the Rt. Recurrence 2024 in the Rt sternal region - had more radiation to the breast and sternal area on the Rt side ending 08/26/23.  OnTrodelvy. Has pacemaker.  Cough and SOB from mass near heart and disease status/medication.   11/29/23 PET: Nodal mets throughout cervical region and Lt supraclavicular region, Rt axillary node and fat of Rt upper back. Concerning areas in the liver increasing mediastinal mass 11.6cm effacing the heart and sternum, a few scattered lung nodules  PAIN:  Are you having pain?No, not currently  PRECAUTIONS: active cancer, pacemaker, bil axillary node removal  RED FLAGS: None   WEIGHT BEARING RESTRICTIONS: No  FALLS:  Has patient fallen in last 6 months? No  LIVING ENVIRONMENT: Lives with: lives alone   OCCUPATION: not working  LEISURE: walking, things around the house   HAND DOMINANCE: right   PRIOR LEVEL OF FUNCTION: Independent with basic ADLs  PATIENT GOALS: Manage the hand swelling  and improve the breast.     OBJECTIVE: Note: Objective measures were completed at Evaluation unless otherwise noted.  COGNITION: Overall cognitive status: Within functional limits for tasks assessed   PALPATION: Rt breast is overall very tight with minimal skin movement, has a slight pink/red appearance, possible skin changes near clavicle, no pitting in the Lt UE just in the back of the hand.   OBSERVATIONS / OTHER ASSESSMENTS: SOB with talking, intermittent cough  POSTURE: WNL  UPPER EXTREMITY AROM/PROM: WFL but pt reports she feels generally tight - will measure  A/PROM RIGHT   eval   Shoulder extension   Shoulder flexion   Shoulder abduction   Shoulder internal rotation   Shoulder external rotation     (Blank rows = not tested)  A/PROM LEFT   eval  Shoulder  extension   Shoulder flexion   Shoulder abduction   Shoulder internal rotation   Shoulder external rotation     (Blank rows = not tested)  CERVICAL AROM: All within normal limits:   UPPER EXTREMITY STRENGTH: not tested   LYMPHEDEMA ASSESSMENTS:   LANDMARK RIGHT  eval  At axilla    15 cm proximal to olecranon process 38.5  10 cm proximal to olecranon process 37.1  Olecranon process 30.3  15 cm proximal to ulnar styloid process 29.3  10 cm proximal to ulnar styloid process 25.3  Just proximal to ulnar styloid process 19.9  Across hand at thumb web space 22.6  At base of 2nd digit 7.5  (Blank rows = not tested)  LANDMARK LEFT  eval  At axilla    15 cm proximal to olecranon process 38.5  10 cm proximal to olecranon process 38.5  Olecranon process 30.8  15 cm proximal to ulnar styloid process 28.3  10 cm proximal to ulnar styloid process 24.8  Just proximal to ulnar styloid process 20  Across hand at thumb web space 23.0  At base of 2nd digit 7.3  (Blank rows = not tested)   Right arm  Left arm  41ml difference   LLIS ; 36.76  TREATMENT DATE:  01/20/24: Manual Therapy MLD to Rt breast: Superficial and deep abdominals, Rt inguinal nodes, Rt axillo-inguinal anastomosis, then focused on Rt breast, then had pt in Lt S/L for further focus to lateral aspect of breast and having pt return brief demo demonstrating how she will have increased ease of reach in this position, then finished retracing all steps in supine. Instructed pt in basics of anatomy of lymphatic system and principles of MLD while performing.  STM to Rt axillary incision and lateral border of scapula where pt palpably very tight. This softened well by end of session.   01/13/24 Eval performed Discussed POC options include bandaging, learning to bandage, getting new  garments Pt decided on wanting to learn to bandage and get new garments 01/25/24 with Rodman Pickle comes.   Gave prescription for bra from Dr. Pamelia Hoit and discussed compression bra options and importance.     PATIENT EDUCATION:  Education details: Rt breast MLD Person educated: Patient Education method: Explanation, demonstration, handouts issued Education comprehension: verbalized understanding, returned brief demo, will benefit from further review  HOME EXERCISE PROGRAM: 01/20/24 - Rt breast self MLD (omitting SCF and only to Rt inguinals)  ASSESSMENT:  CLINICAL IMPRESSION: Began MLD to Rt breast as pt requested focusing on this today as this is causing her the most discomfort. Educated her in this and issued handout for her to begin working on this at home. She has called [facility] [employer] and is waiting to hear back about insurance coverage so she can get compression bra(s). Also focused on fascial restrictions at Rt axilla and lateral trunk. Overall pt reports feeling better at end of session.    OBJECTIVE IMPAIRMENTS: decreased activity tolerance, decreased knowledge of condition, decreased knowledge of use of DME, increased edema, and increased fascial restrictions.   ACTIVITY LIMITATIONS: carrying, lifting, and reach over head  PARTICIPATION LIMITATIONS: cleaning and community activity  PERSONAL FACTORS: 3+ comorbidities: disease status, bil node removal radiation x 2  are also affecting patient's functional outcome.   REHAB POTENTIAL: Good  CLINICAL DECISION MAKING: Evolving/moderate complexity  EVALUATION COMPLEXITY: Moderate  GOALS: Goals reviewed with patient? Yes  SHORT TERM GOALS: Target date: 01/26/24  Pt will be compression for the breast and chest wall Baseline: Goal status: INITIAL  2.  Pt will be scheduled to get measured for new UE compression garments  Baseline:  Goal status: INITIAL   LONG TERM GOALS: Target date: 03/09/24  Pt will be ind with self bandaging  of the Lt arm Baseline:  Goal status: INITIAL  2.  Pt will return to use of the pump without need for device modifications (may need to change flow patterns) Baseline:  Goal status: INITIAL  3.  Pt will report no resting pain in the Left hand  Baseline: 3-5/10 Goal status: INITIAL   PLAN:  PT FREQUENCY: 1-2x/week  PT DURATION: 6 weeks  PLANNED INTERVENTIONS: 97110-Therapeutic exercises, 97530- Therapeutic activity, 97535- Self Care, 16109- Manual therapy, 97760- Orthotic Fit/training, Patient/Family education, Taping, Therapeutic exercises, Therapeutic activity, Neuromuscular re-education, Gait training, and Self Care  PLAN FOR NEXT SESSION: get compression bra?, Called pt and she is to call us back Monday to schedule to sign up for garments on 4/2, teach Lt UE bandaging (hand is the worst), Cont and review Rt breast MLD and Lt arm MLD towards inguinals only - omit cervical work Engineer, maintenance (IT) about bil cancer program and how pt has to switch arms on the garment for each side   Hermenia Bers,  PTA 01/20/2024, 12:44 PM   Self manual lymph drainage: Perform this sequence once a day.  Only give enough pressure to your skin to make the skin move.  Diaphragmatic - Supine   Inhale through nose making navel move out toward hands. Exhale through puckered lips, hands follow navel in. Repeat _5__ times. Rest _10__ seconds between repeats.    LEG: Inguinal Nodes Stimulation   With small finger side of hand against hip crease on involved side, gently perform circles at the crease. Repeat __10_ times.   Copyright  VHI. All rights reserved.  Axilla to Inguinal Nodes - Sweep   On involved side, pump _4__ times from armpit along side of trunk to hip crease.   Draw an imaginary diagonal line from upper outer breast through the nipple area toward lower inner breast.  Direct fluid upward and outward from this line toward the pathway.       Direct fluid to treat all of lower  outer breast tissue downward and outward toward  pathway that is aimed at the left groin. Lay on Left side for easier access to side of breast.   Finish by doing the pathways as described above going from your involved armpit to the same side groin.  Repeat the steps above where you do circles in your right groin  Cancer Rehab [phone]

## 2024-01-21 ENCOUNTER — Inpatient Hospital Stay: Payer: Medicare Other

## 2024-01-21 VITALS — BP 132/88 | HR 92 | Temp 98.4°F

## 2024-01-21 DIAGNOSIS — N6331 Unspecified lump in axillary tail of the right breast: Secondary | ICD-10-CM

## 2024-01-21 DIAGNOSIS — C50012 Malignant neoplasm of nipple and areola, left female breast: Secondary | ICD-10-CM | POA: Diagnosis not present

## 2024-01-21 DIAGNOSIS — C50919 Malignant neoplasm of unspecified site of unspecified female breast: Secondary | ICD-10-CM

## 2024-01-21 MED ORDER — FILGRASTIM-SNDZ 480 MCG/0.8ML IJ SOSY
480.0000 ug | PREFILLED_SYRINGE | Freq: Once | INTRAMUSCULAR | Status: AC
  Administered 2024-01-21: 480 ug via SUBCUTANEOUS

## 2024-01-23 ENCOUNTER — Inpatient Hospital Stay: Payer: Medicare Other

## 2024-01-23 VITALS — BP 122/78 | HR 99 | Temp 98.9°F | Resp 15 | Wt 202.2 lb

## 2024-01-23 DIAGNOSIS — N6331 Unspecified lump in axillary tail of the right breast: Secondary | ICD-10-CM

## 2024-01-23 DIAGNOSIS — C50919 Malignant neoplasm of unspecified site of unspecified female breast: Secondary | ICD-10-CM

## 2024-01-23 DIAGNOSIS — C50012 Malignant neoplasm of nipple and areola, left female breast: Secondary | ICD-10-CM | POA: Diagnosis not present

## 2024-01-23 DIAGNOSIS — Z95828 Presence of other vascular implants and grafts: Secondary | ICD-10-CM

## 2024-01-23 LAB — CMP (CANCER CENTER ONLY)
ALT: 11 U/L (ref 0–44)
AST: 12 U/L — ABNORMAL LOW (ref 15–41)
Albumin: 4.2 g/dL (ref 3.5–5.0)
Alkaline Phosphatase: 97 U/L (ref 38–126)
Anion gap: 8 (ref 5–15)
BUN: 16 mg/dL (ref 6–20)
CO2: 30 mmol/L (ref 22–32)
Calcium: 9.3 mg/dL (ref 8.9–10.3)
Chloride: 102 mmol/L (ref 98–111)
Creatinine: 0.86 mg/dL (ref 0.44–1.00)
GFR, Estimated: >60 mL/min (ref >60)
Glucose, Bld: 94 mg/dL (ref 70–99)
Potassium: 3.6 mmol/L (ref 3.5–5.1)
Sodium: 140 mmol/L (ref 135–145)
Total Bilirubin: 0.3 mg/dL (ref 0.0–1.2)
Total Protein: 7.1 g/dL (ref 6.5–8.1)

## 2024-01-23 LAB — MAGNESIUM: Magnesium: 1.9 mg/dL (ref 1.7–2.4)

## 2024-01-23 LAB — CBC WITH DIFFERENTIAL (CANCER CENTER ONLY)
Abs Immature Granulocytes: 0.7 10*3/uL — ABNORMAL HIGH (ref 0.00–0.07)
Basophils Absolute: 0 10*3/uL (ref 0.0–0.1)
Basophils Relative: 0 %
Eosinophils Absolute: 0.1 10*3/uL (ref 0.0–0.5)
Eosinophils Relative: 1 %
HCT: 36 % (ref 36.0–46.0)
Hemoglobin: 11.5 g/dL — ABNORMAL LOW (ref 12.0–15.0)
Immature Granulocytes: 10 %
Lymphocytes Relative: 5 %
Lymphs Abs: 0.4 10*3/uL — ABNORMAL LOW (ref 0.7–4.0)
MCH: 28.3 pg (ref 26.0–34.0)
MCHC: 31.9 g/dL (ref 30.0–36.0)
MCV: 88.7 fL (ref 80.0–100.0)
Monocytes Absolute: 0.4 10*3/uL (ref 0.1–1.0)
Monocytes Relative: 5 %
Neutro Abs: 5.7 10*3/uL (ref 1.7–7.7)
Neutrophils Relative %: 79 %
Platelet Count: 115 10*3/uL — ABNORMAL LOW (ref 150–400)
RBC: 4.06 MIL/uL (ref 3.87–5.11)
RDW: 17 % — ABNORMAL HIGH (ref 11.5–15.5)
WBC Count: 7.2 10*3/uL (ref 4.0–10.5)
nRBC: 0 % (ref 0.0–0.2)

## 2024-01-23 LAB — PHOSPHORUS: Phosphorus: 4 mg/dL (ref 2.5–4.6)

## 2024-01-23 MED ORDER — PALONOSETRON HCL INJECTION 0.25 MG/5ML
0.2500 mg | Freq: Once | INTRAVENOUS | Status: AC
Start: 2024-01-23 — End: 2024-01-23
  Administered 2024-01-23: 0.25 mg via INTRAVENOUS
  Filled 2024-01-23: qty 5

## 2024-01-23 MED ORDER — SODIUM CHLORIDE 0.9% FLUSH
10.0000 mL | Freq: Once | INTRAVENOUS | Status: AC
Start: 2024-01-23 — End: 2024-01-23
  Administered 2024-01-23: 10 mL

## 2024-01-23 MED ORDER — FAMOTIDINE IN NACL 20-0.9 MG/50ML-% IV SOLN
20.0000 mg | Freq: Once | INTRAVENOUS | Status: AC
Start: 2024-01-23 — End: 2024-01-23
  Administered 2024-01-23: 20 mg via INTRAVENOUS
  Filled 2024-01-23: qty 50

## 2024-01-23 MED ORDER — SODIUM CHLORIDE 0.9 % IV SOLN
Freq: Once | INTRAVENOUS | Status: AC
Start: 2024-01-23 — End: 2024-01-23

## 2024-01-23 MED ORDER — ATROPINE SULFATE 1 MG/ML IV SOLN
0.5000 mg | Freq: Once | INTRAVENOUS | Status: AC | PRN
  Administered 2024-01-23: 0.5 mg via INTRAVENOUS
  Filled 2024-01-23: qty 1

## 2024-01-23 MED ORDER — DIPHENHYDRAMINE HCL 50 MG/ML IJ SOLN
50.0000 mg | Freq: Once | INTRAMUSCULAR | Status: AC
  Administered 2024-01-23: 50 mg via INTRAVENOUS
  Filled 2024-01-23: qty 1

## 2024-01-23 MED ORDER — FOSAPREPITANT DIMEGLUMINE INJECTION 150 MG
150.0000 mg | Freq: Once | INTRAVENOUS | Status: AC
  Administered 2024-01-23: 150 mg via INTRAVENOUS
  Filled 2024-01-23: qty 150

## 2024-01-23 MED ORDER — DEXAMETHASONE SODIUM PHOSPHATE 10 MG/ML IJ SOLN
10.0000 mg | Freq: Once | INTRAMUSCULAR | Status: AC
  Administered 2024-01-23: 10 mg via INTRAVENOUS
  Filled 2024-01-23: qty 1

## 2024-01-23 MED ORDER — ACETAMINOPHEN 325 MG PO TABS
650.0000 mg | ORAL_TABLET | Freq: Once | ORAL | Status: AC
  Administered 2024-01-23: 650 mg via ORAL
  Filled 2024-01-23: qty 2

## 2024-01-23 MED ORDER — SODIUM CHLORIDE 0.9% FLUSH
10.0000 mL | INTRAVENOUS | Status: DC | PRN
  Administered 2024-01-23: 10 mL

## 2024-01-23 MED ORDER — HEPARIN SOD (PORK) LOCK FLUSH 100 UNIT/ML IV SOLN
500.0000 [IU] | Freq: Once | INTRAVENOUS | Status: AC | PRN
  Administered 2024-01-23: 500 [IU]

## 2024-01-23 MED ORDER — SODIUM CHLORIDE 0.9 % IV SOLN
460.0000 mg | Freq: Once | INTRAVENOUS | Status: AC
  Administered 2024-01-23: 460 mg via INTRAVENOUS
  Filled 2024-01-23: qty 46

## 2024-01-23 NOTE — Progress Notes (Signed)
 Decrease trodelvy to 5mg /kg today per Dr Pamelia Hoit

## 2024-01-23 NOTE — Progress Notes (Signed)
 Per Dr. Pamelia Hoit, OK to proceed with treatment with pending phosphorus lab value.

## 2024-01-23 NOTE — Progress Notes (Signed)
 Remote pacemaker transmission.

## 2024-01-23 NOTE — Patient Instructions (Signed)
 [facility]  Discharge Instructions: Thank you for choosing [facility] to provide your oncology and hematology care.   If you have a lab appointment with the Cancer Center, please go directly to the Cancer Center and check in at the registration area.   Wear comfortable clothing and clothing appropriate for easy access to any Portacath or PICC line.   We strive to give you quality time with your provider. You may need to reschedule your appointment if you arrive late (15 or more minutes).  Arriving late affects you and other patients whose appointments are after yours.  Also, if you miss three or more appointments without notifying the office, you may be dismissed from the clinic at the provider's discretion.      For prescription refill requests, have your pharmacy contact our office and allow 72 hours for refills to be completed.    Today you received the following chemotherapy and/or immunotherapy agents: [name]      To help prevent nausea and vomiting after your treatment, we encourage you to take your nausea medication as directed.  BELOW ARE SYMPTOMS THAT SHOULD BE REPORTED IMMEDIATELY: *FEVER GREATER THAN 100.4 F (38 C) OR HIGHER *CHILLS OR SWEATING *NAUSEA AND VOMITING THAT IS NOT CONTROLLED WITH YOUR NAUSEA MEDICATION *UNUSUAL SHORTNESS OF BREATH *UNUSUAL BRUISING OR BLEEDING *URINARY PROBLEMS (pain or burning when urinating, or frequent urination) *BOWEL PROBLEMS (unusual diarrhea, constipation, pain near the anus) TENDERNESS IN MOUTH AND THROAT WITH OR WITHOUT PRESENCE OF ULCERS (sore throat, sores in mouth, or a toothache) UNUSUAL RASH, SWELLING OR PAIN  UNUSUAL VAGINAL DISCHARGE OR ITCHING   Items with * indicate a potential emergency and should be followed up as soon as possible or go to the Emergency Department if any problems should occur.  Please show the CHEMOTHERAPY ALERT CARD or IMMUNOTHERAPY  ALERT CARD at check-in to the Emergency Department and triage nurse.  Should you have questions after your visit or need to cancel or reschedule your appointment, please contact [facility]  Dept: [phone]  and follow the prompts.  Office hours are 8:00 a.m. to 4:30 p.m. Monday - Friday. Please note that voicemails left after 4:00 p.m. may not be returned until the following business day.  We are closed weekends and major holidays. You have access to a nurse at all times for urgent questions. Please call the main number to the clinic Dept: [phone] and follow the prompts.   For any non-urgent questions, you may also contact your provider using MyChart. We now offer e-Visits for anyone 66 and older to request care online for non-urgent symptoms. For details visit [email or web address].   Also download the MyChart app! Go to the app store, search "MyChart", open the app, select [place], and log in with your MyChart username and password.

## 2024-01-23 NOTE — Progress Notes (Signed)
Patient declined post infusion observation.  Tolerated treatment well without incident.  VSS at discharge.  Ambulated to lobby.

## 2024-01-24 ENCOUNTER — Ambulatory Visit: Admitting: Rehabilitation

## 2024-01-25 ENCOUNTER — Ambulatory Visit: Attending: Hematology and Oncology

## 2024-01-25 DIAGNOSIS — Z483 Aftercare following surgery for neoplasm: Secondary | ICD-10-CM | POA: Insufficient documentation

## 2024-01-25 DIAGNOSIS — M79602 Pain in left arm: Secondary | ICD-10-CM | POA: Diagnosis present

## 2024-01-25 DIAGNOSIS — C50919 Malignant neoplasm of unspecified site of unspecified female breast: Secondary | ICD-10-CM | POA: Insufficient documentation

## 2024-01-25 DIAGNOSIS — C771 Secondary and unspecified malignant neoplasm of intrathoracic lymph nodes: Secondary | ICD-10-CM | POA: Insufficient documentation

## 2024-01-25 DIAGNOSIS — I890 Lymphedema, not elsewhere classified: Secondary | ICD-10-CM | POA: Insufficient documentation

## 2024-01-25 NOTE — Therapy (Addendum)
 OUTPATIENT PHYSICAL THERAPY  UPPER EXTREMITY ONCOLOGY TREATMENT  Patient Name: Jessica Simpson MRN: [id number] DOB:08/02/1971, 53 y.o., female Today's Date: 01/25/2024  END OF SESSION:  PT End of Session - 01/25/24 1452     Visit Number 3    Number of Visits 13    Date for PT Re-Evaluation 03/09/24    Authorization Type needed [facility]    PT Start Time 1449    PT Stop Time 1552    PT Time Calculation (min) 63 min    Activity Tolerance Patient tolerated treatment well    Behavior During Therapy [facility] for tasks assessed/performed             Past Medical History:  Diagnosis Date   BRCA1 negative 10/22/2011   BRCA2 negative 10/22/2011   Breast cancer, IDC, Left, Stage III, Triple negative 06/30/2011   Breast disorder    Contraceptive education 01/15/2016   Cough    Dyspnea    Family history of breast cancer    Fracture    right 4th toe   History of breast cancer 07/22/2014   History of radiation therapy 05/16/18- 06/22/18   Right Breast/ 50.4 Gy in 28 fractions. Right posterior axilla and SCV nodes/ 50.4 Gy in 28 fractions.    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 She then received a boost to the left lumpectomy of 10 gray in 5 fractions. This was given at [facility].   Sleep apnea    Status post chemotherapy Comp. 11/23/11   FEC and Taxotere   Past Surgical History:  Procedure Laterality Date   anal ascess     turned into a fistula with extensive treatment    AXILLARY LYMPH NODE BIOPSY Right 04/06/2018   AXILLARY LYMPH NODE DISSECTION Right 04/06/2018   Procedure: RIGHT AXILLARY LYMPH NODE DISSECTION;  Surgeon: Emelia Loron, MD;  Location: [facility] OR;  Service: General;  Laterality: Right;   BREAST BIOPSY Left 2012   BREAST BIOPSY Right 2019   BREAST LUMPECTOMY Left 2012   BREAST LUMPECTOMY Right 2019   rt axilla   BREAST SURGERY     CARDIAC ELECTROPHYSIOLOGY MAPPING AND ABLATION     CESAREAN SECTION     COLONOSCOPY N/A 08/14/2014   Procedure: COLONOSCOPY;  Surgeon: Malissa Hippo, MD;  Location: [facility];  Service: Endoscopy;  Laterality: N/A;  930   EVACUATION BREAST HEMATOMA  12/21/2011   Procedure: EVACUATION HEMATOMA BREAST;  Surgeon: Almond Lint, MD;  Location: [facility] OR;  Service: General;  Laterality: Left;   HAND SURGERY     tumor on finger, left hand   INCISION AND DRAINAGE ABSCESS ANAL     x3   IR IMAGING GUIDED PORT INSERTION  08/01/2023   left breast biopsy with axillary biopsy     core bx   PORT-A-CATH REMOVAL  12/21/2011   Procedure: REMOVAL PORT-A-CATH;  Surgeon: Currie Paris, MD;  Location: [facility];  Service: General;  Laterality:  N/A;   PORTACATH PLACEMENT  08/02/2011   dr Jamey Ripa   PORTACATH PLACEMENT Right 11/14/2017   Procedure: INSERTION PORT-A-CATH WITH [place];  Surgeon: Emelia Loron, MD;  Location: [facility] OR;  Service: General;  Laterality: Right;   removal breast mass  2004   benign   TREATMENT FISTULA ANAL     TUMOR REMOVAL     on left hand   TUMOR REMOVAL     rt. 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   High degree atrioventricular block 11/15/2023   Secondary malignancy of mediastinal lymph nodes (HCC) 07/20/2023   PMB (postmenopausal bleeding) 01/26/2022   Papanicolaou smear of cervix with positive high risk human papilloma virus (HPV) test 12/25/2020   Routine medical exam 12/19/2020   Encounter for  screening fecal occult blood testing 12/19/2020   Hypertension 09/01/2018   Screening for colorectal cancer 09/01/2018   Encounter for gynecological examination with Papanicolaou smear of cervix 09/01/2018   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) 04/25/2018   Genetic testing 01/12/2018   Family history of breast cancer    Port-A-Cath in place 11/21/2017   Mass of axillary tail of right breast 08/24/2017   Pain with urination 08/24/2017   Urinary frequency 08/24/2017   Hematuria 08/24/2017   Encounter for well woman exam with routine gynecological exam 01/15/2016   Benign cyst of right breast 11/29/2015   Positive fecal occult blood test 07/22/2014   History of breast cancer 07/22/2014   Rectal bleeding 07/10/2014   Lymphedema 06/15/2012   BRCA1 negative 10/22/2011   BRCA2 negative 10/22/2011   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) 06/30/2011    Class: Diagnosis of    PCP: Ardean Larsen, MD  REFERRING PROVIDER: Serena Croissant, MD  REFERRING DIAG: lymphedema   THERAPY DIAG:  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, unspecified laterality (HCC)  Lymphedema  Pain in left arm  Aftercare following surgery for neoplasm  ONSET DATE: 2013  Rationale for Evaluation and Treatment: Rehabilitation  SUBJECTIVE:                                                                                                                                                                                           SUBJECTIVE STATEMENT:   I had chemo Monday and I am still really fatigued. I want to cancel my appt for tomorrow. I just got measured for new compression sleeve and glove. Also I have an app[facility] after I leave here next Tuesday. I've been doing  the self MLD and it's going pretty well. I can tell a brief improvement in my Rt breast after I do it. That breast is just always full though.   PERTINENT HISTORY: Breast cancer  Lt treated in 2012-2013 with chemotherapy/taxotere post lumpectomy and SLNB 3/7 nodes and radiation with onset of lymphedema during radiation, treatment of lymphedema at [facility] in 2013. Reoccurence in Rt axillary lymph node with chemotherapy carboplatin and gemcitabine x 6 cycles and ALND 1/11 positive, radiation completed 06/19/18 on the Rt. Recurrence 2024 in the Rt sternal region - had more radiation to the breast and sternal area on the Rt side ending 08/26/23.  OnTrodelvy. Has pacemaker.  Cough and SOB from mass near heart and disease status/medication.   11/29/23 PET: Nodal mets throughout cervical region and Lt supraclavicular region, Rt axillary node and fat of Rt upper back. Concerning areas in the liver increasing mediastinal mass 11.6cm effacing the heart and sternum, a few scattered lung nodules  PAIN:  Are you having pain?No, not currently  PRECAUTIONS: active cancer, pacemaker, bil axillary node removal  RED FLAGS: None   WEIGHT BEARING RESTRICTIONS: No  FALLS:  Has patient fallen in last 6 months? No  LIVING ENVIRONMENT: Lives with: lives alone   OCCUPATION: not working  LEISURE: walking, things around the house   HAND DOMINANCE: right   PRIOR LEVEL OF FUNCTION: Independent with basic ADLs  PATIENT GOALS: Manage the hand swelling  and improve the breast.     OBJECTIVE: Note: Objective measures were completed at Evaluation unless otherwise noted.  COGNITION: Overall cognitive status: Within functional limits for tasks assessed   PALPATION: Rt breast is overall very tight with minimal skin movement, has a slight pink/red appearance, possible skin changes near clavicle, no pitting in the Lt UE just in the back of the hand.   OBSERVATIONS / OTHER ASSESSMENTS: SOB with talking, intermittent cough  POSTURE: WNL  UPPER EXTREMITY AROM/PROM: WFL but pt reports she feels generally tight - will measure  A/PROM RIGHT   eval   Shoulder extension   Shoulder flexion    Shoulder abduction   Shoulder internal rotation   Shoulder external rotation     (Blank rows = not tested)  A/PROM LEFT   eval  Shoulder extension   Shoulder flexion   Shoulder abduction   Shoulder internal rotation   Shoulder external rotation     (Blank rows = not tested)  CERVICAL AROM: All within normal limits:   UPPER EXTREMITY STRENGTH: not tested   LYMPHEDEMA ASSESSMENTS:   LANDMARK RIGHT  eval  At axilla    15 cm proximal to olecranon process 38.5  10 cm proximal to olecranon process 37.1  Olecranon process 30.3  15 cm proximal to ulnar styloid process 29.3  10 cm proximal to ulnar styloid process 25.3  Just proximal to ulnar styloid process 19.9  Across hand at thumb web space 22.6  At base of 2nd digit 7.5  (Blank rows = not tested)  LANDMARK LEFT  eval  At axilla    15 cm proximal to olecranon process 38.5  10 cm proximal to olecranon process 38.5  Olecranon process 30.8  15 cm proximal to ulnar styloid process 28.3  10 cm proximal to ulnar styloid process 24.8  Just proximal to ulnar styloid process 20  Across hand at thumb web space 23.0  At base of 2nd digit 7.3  (Blank rows = not tested)   Right arm  Left arm  41ml difference   LLIS ; 36.76                                                                                                                            TREATMENT DATE:  01/25/24: Self Care Spent time at beginning of session instructing pt in compression bandaging for her Lt hand only today as she reports this is what fluctuates the most. Pt was able to return good demo. She video recorded therapist applying this after pt  returned demo, also issued handout for this.  Manual Therapy MLD to Rt breast: Superficial and deep abdominals, Rt inguinal nodes, Rt axillo-inguinal anastomosis, then focused on Rt breast, then had pt in Lt S/L for further focus to lateral aspect of breast, then finished retracing all steps in supine.  Then also performed to Lt side as follows, Lt inguinal nodes, Lt axillo-inguinal anastomosis, and Lt UE working from proximal to distal and then retracting all steps.  STM to Rt axillary incision and lateral border of scapula where pt palpably very tight. This continued to soft by end of session.   01/20/24: Manual Therapy MLD to Rt breast: Superficial and deep abdominals, Rt inguinal nodes, Rt axillo-inguinal anastomosis, then focused on Rt breast, then had pt in Lt S/L for further focus to lateral aspect of breast and having pt return brief demo demonstrating how she will have increased ease of reach in this position, then finished retracing all steps in supine. Instructed pt in basics of anatomy of lymphatic system and principles of MLD while performing.  STM to Rt axillary incision and lateral border of scapula where pt palpably very tight. This softened well by end of session.   01/13/24 Eval performed Discussed POC options include bandaging, learning to bandage, getting new garments Pt decided on wanting to learn to bandage and get new garments 01/25/24 with [facility] comes.   Gave prescription for bra from Dr. Pamelia Hoit and discussed compression bra options and importance.     PATIENT EDUCATION:  Education details: Rt breast MLD Person educated: Patient Education method: Explanation, demonstration, handouts issued Education comprehension: verbalized understanding, returned brief demo, will benefit from further review  HOME EXERCISE PROGRAM: 01/20/24 - Rt breast self MLD (omitting SCF and only to Rt inguinals)  ASSESSMENT:  CLINICAL IMPRESSION: Pt was measured for new compression sleeve and glove before session with Rodman Pickle from [place].  Pt will benefit from a custom flat knit sleeve class 2 in a 550 stronger fabric due to chronic metastatic nature of her swelling.  She presents with stage 2 lymphedema and does not fit into an OTS garment due to size chart restrictions.  She will also need a  glove due to fluctuating hand swelling and severity of hand swelling.  She will benefit from a wide top band and an elbow flexure zone for comfort.   Then instructed her in compression bandaging for Lt hand and had her return demo. She did well with this but  will benefit from further review. Then also continued with MLD to Rt breast and incorporated Lt UE briefly as well. Pt is noting temporary relief with self MLD to Rt breast. She gets measured for compression bras next week.    OBJECTIVE IMPAIRMENTS: decreased activity tolerance, decreased knowledge of condition, decreased knowledge of use of DME, increased edema, and increased fascial restrictions.   ACTIVITY LIMITATIONS: carrying, lifting, and reach over head  PARTICIPATION LIMITATIONS: cleaning and community activity  PERSONAL FACTORS: 3+ comorbidities: disease status, bil node removal radiation x 2  are also affecting patient's functional outcome.   REHAB POTENTIAL: Good  CLINICAL DECISION MAKING: Evolving/moderate complexity  EVALUATION COMPLEXITY: Moderate  GOALS: Goals reviewed with patient? Yes  SHORT TERM GOALS: Target date: 01/26/24  Pt will be compression for the breast and chest wall Baseline: Goal status: INITIAL  2.  Pt will be scheduled to get measured for new UE compression garments  Baseline:  Goal status: INITIAL   LONG TERM GOALS: Target date: 03/09/24  Pt will be ind with self bandaging of the Lt arm Baseline:  Goal status: INITIAL  2.  Pt will return to use of the pump without need for device modifications (may need to change flow patterns) Baseline:  Goal status: INITIAL  3.  Pt will report no resting pain in the Left hand  Baseline: 3-5/10 Goal status: INITIAL   PLAN:  PT FREQUENCY: 1-2x/week  PT DURATION: 6 weeks  PLANNED INTERVENTIONS: 97110-Therapeutic exercises, 97530- Therapeutic activity, 97535- Self Care, 16109- Manual therapy, 97760- Orthotic Fit/training, Patient/Family education,  Taping, Therapeutic exercises, Therapeutic activity, Neuromuscular re-education, Gait training, and Self Care  PLAN FOR NEXT SESSION: Review Lt UE bandaging (hand is the worst), Cont and review Rt breast MLD and Lt arm MLD towards inguinals only - omit cervical work Engineer, maintenance (IT) about bil cancer program and how pt has to switch arms on the garment for each side   Hermenia Bers, PTA 01/25/2024, 4:09 PM   Self manual lymph drainage: Perform this sequence once a day.  Only give enough pressure to your skin to make the skin move.  Diaphragmatic - Supine   Inhale through nose making navel move out toward hands. Exhale through puckered lips, hands follow navel in. Repeat _5__ times. Rest _10__ seconds between repeats.    LEG: Inguinal Nodes Stimulation   With small finger side of hand against hip crease on involved side, gently perform circles at the crease. Repeat __10_ times.   Copyright  VHI. All rights reserved.  Axilla to Inguinal Nodes - Sweep   On involved side, pump _4__ times from armpit along side of trunk to hip crease.   Draw an imaginary diagonal line from upper outer breast through the nipple area toward lower inner breast.  Direct fluid upward and outward from this line toward the pathway.       Direct fluid to treat all of lower outer breast tissue downward and outward toward  pathway that is aimed at the left groin. Lay on Left side for easier access to side of breast.   Finish by doing the pathways as described above going from your involved armpit to the same side groin.  Repeat the steps above where you do circles in your right groin  Cancer Rehab [phone]

## 2024-01-26 ENCOUNTER — Ambulatory Visit

## 2024-01-31 ENCOUNTER — Encounter: Payer: Self-pay | Admitting: Rehabilitation

## 2024-01-31 ENCOUNTER — Ambulatory Visit: Admitting: Rehabilitation

## 2024-01-31 DIAGNOSIS — C50919 Malignant neoplasm of unspecified site of unspecified female breast: Secondary | ICD-10-CM | POA: Diagnosis not present

## 2024-01-31 DIAGNOSIS — Z483 Aftercare following surgery for neoplasm: Secondary | ICD-10-CM

## 2024-01-31 DIAGNOSIS — I890 Lymphedema, not elsewhere classified: Secondary | ICD-10-CM

## 2024-01-31 DIAGNOSIS — M79602 Pain in left arm: Secondary | ICD-10-CM

## 2024-01-31 NOTE — Therapy (Signed)
 OUTPATIENT PHYSICAL THERAPY  UPPER EXTREMITY ONCOLOGY TREATMENT  Patient Name: Jessica Simpson MRN: [id number] DOB:28-Apr-1971, 53 y.o., female Today's Date: 01/31/2024  END OF SESSION:  PT End of Session - 01/31/24 1107     Visit Number 4    Number of Visits 13    Date for PT Re-Evaluation 03/09/24    Authorization Type 8 visits 01/12/24-02/23/24    Authorization - Visit Number 4    Authorization - Number of Visits 8    PT Start Time 1108    PT Stop Time 1158    PT Time Calculation (min) 50 min    Activity Tolerance Patient tolerated treatment well    Behavior During Therapy [facility] for tasks assessed/performed             Past Medical History:  Diagnosis Date   BRCA1 negative 10/22/2011   BRCA2 negative 10/22/2011   Breast cancer, IDC, Left, Stage III, Triple negative 06/30/2011   Breast disorder    Contraceptive education 01/15/2016   Cough    Dyspnea    Family history of breast cancer    Fracture    right 4th toe   History of breast cancer 07/22/2014   History of radiation therapy 05/16/18- 06/22/18   Right Breast/ 50.4 Gy in 28 fractions. Right posterior axilla and SCV nodes/ 50.4 Gy in 28 fractions.    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 She then received a boost to the left lumpectomy of 10 gray in 5 fractions. This was given at [facility].   Sleep apnea    Status post chemotherapy Comp. 11/23/11   FEC and Taxotere   Past Surgical History:   Procedure Laterality Date   anal ascess     turned into a fistula with extensive treatment   AXILLARY LYMPH NODE BIOPSY Right 04/06/2018   AXILLARY LYMPH NODE DISSECTION Right 04/06/2018   Procedure: RIGHT AXILLARY LYMPH NODE DISSECTION;  Surgeon: Emelia Loron, MD;  Location: [facility] OR;  Service: General;  Laterality: Right;   BREAST BIOPSY Left 2012   BREAST BIOPSY Right 2019   BREAST LUMPECTOMY Left 2012   BREAST LUMPECTOMY Right 2019   rt axilla   BREAST SURGERY     CARDIAC ELECTROPHYSIOLOGY MAPPING AND ABLATION     CESAREAN SECTION     COLONOSCOPY N/A 08/14/2014   Procedure: COLONOSCOPY;  Surgeon: Malissa Hippo, MD;  Location: [facility];  Service: Endoscopy;  Laterality: N/A;  930   EVACUATION BREAST HEMATOMA  12/21/2011   Procedure: EVACUATION HEMATOMA BREAST;  Surgeon: Almond Lint, MD;  Location: [facility] OR;  Service: General;  Laterality: Left;   HAND SURGERY     tumor on finger, left hand   INCISION AND DRAINAGE ABSCESS ANAL     x3   IR IMAGING GUIDED PORT INSERTION  08/01/2023   left breast biopsy with axillary biopsy     core bx   PORT-A-CATH REMOVAL  12/21/2011   Procedure: REMOVAL  PORT-A-CATH;  Surgeon: Currie Paris, MD;  Location: [facility];  Service: General;  Laterality: N/A;   PORTACATH PLACEMENT  08/02/2011   dr Jamey Ripa   PORTACATH PLACEMENT Right 11/14/2017   Procedure: INSERTION PORT-A-CATH WITH [place];  Surgeon: Emelia Loron, MD;  Location: [facility] OR;  Service: General;  Laterality: Right;   removal breast mass  2004   benign   TREATMENT FISTULA ANAL     TUMOR REMOVAL     on left hand   TUMOR REMOVAL     rt. 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   High degree atrioventricular block 11/15/2023   Secondary malignancy of mediastinal lymph nodes (HCC) 07/20/2023   PMB (postmenopausal bleeding) 01/26/2022   Papanicolaou smear of cervix with positive high risk  human papilloma virus (HPV) test 12/25/2020   Routine medical exam 12/19/2020   Encounter for screening fecal occult blood testing 12/19/2020   Hypertension 09/01/2018   Screening for colorectal cancer 09/01/2018   Encounter for gynecological examination with Papanicolaou smear of cervix 09/01/2018   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) 04/25/2018   Genetic testing 01/12/2018   Family history of breast cancer    Port-A-Cath in place 11/21/2017   Mass of axillary tail of right breast 08/24/2017   Pain with urination 08/24/2017   Urinary frequency 08/24/2017   Hematuria 08/24/2017   Encounter for well woman exam with routine gynecological exam 01/15/2016   Benign cyst of right breast 11/29/2015   Positive fecal occult blood test 07/22/2014   History of breast cancer 07/22/2014   Rectal bleeding 07/10/2014   Lymphedema 06/15/2012   BRCA1 negative 10/22/2011   BRCA2 negative 10/22/2011   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) 06/30/2011    Class: Diagnosis of    PCP: Ardean Larsen, MD  REFERRING PROVIDER: Serena Croissant, MD  REFERRING DIAG: lymphedema   THERAPY DIAG:  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, unspecified laterality (HCC)  Lymphedema  Aftercare following surgery for neoplasm  Pain in left arm  ONSET DATE: 2013  Rationale for Evaluation and Treatment: Rehabilitation  SUBJECTIVE:                                                                                                                                                                                           SUBJECTIVE STATEMENT:   It is swelling so bad that it feels numb.  I tried to wrap it 1x and it was okay but I'm not so fast.  PERTINENT HISTORY: Breast cancer Lt treated in 2012-2013 with chemotherapy/taxotere post lumpectomy and SLNB 3/7 nodes and radiation with onset of lymphedema during radiation, treatment of lymphedema at [facility] in  2013. Reoccurence in Rt axillary lymph node with chemotherapy carboplatin and gemcitabine x 6 cycles and ALND 1/11 positive, radiation completed 06/19/18 on the Rt. Recurrence 2024 in the Rt sternal region - had more radiation to the breast and sternal area on the Rt side ending 08/26/23.  OnTrodelvy. Has pacemaker.  Cough and SOB from mass near heart and disease status/medication.   11/29/23 PET: Nodal mets throughout cervical region and Lt supraclavicular region, Rt axillary node and fat of Rt upper back. Concerning areas in the liver increasing mediastinal mass 11.6cm effacing the heart and sternum, a few scattered lung nodules  PAIN:  Are you having pain? No, not currently  PRECAUTIONS: active cancer, pacemaker, bil axillary node removal  RED FLAGS: None   WEIGHT BEARING RESTRICTIONS: No  FALLS:  Has patient fallen in last 6 months? No  LIVING ENVIRONMENT: Lives with: lives alone   OCCUPATION: not working  LEISURE: walking, things around the house   HAND DOMINANCE: right   PRIOR LEVEL OF FUNCTION: Independent with basic ADLs  PATIENT GOALS: Manage the hand swelling  and improve the breast.     OBJECTIVE: Note: Objective measures were completed at Evaluation unless otherwise noted.  COGNITION: Overall cognitive status: Within functional limits for tasks assessed   PALPATION: Rt breast is overall very tight with minimal skin movement, has a slight pink/red appearance, possible skin changes near clavicle, no pitting in the Lt UE just in the back of the hand.   OBSERVATIONS / OTHER ASSESSMENTS: SOB with talking, intermittent cough  POSTURE: WNL  UPPER EXTREMITY AROM/PROM: WFL but pt reports she feels generally tight - will measure  A/PROM RIGHT   eval   Shoulder extension   Shoulder flexion   Shoulder abduction   Shoulder internal rotation   Shoulder external rotation     (Blank rows = not tested)  A/PROM LEFT   eval  Shoulder extension   Shoulder flexion    Shoulder abduction   Shoulder internal rotation   Shoulder external rotation     (Blank rows = not tested)  CERVICAL AROM: All within normal limits:   UPPER EXTREMITY STRENGTH: not tested   LYMPHEDEMA ASSESSMENTS:   LANDMARK RIGHT  eval  At axilla    15 cm proximal to olecranon process 38.5  10 cm proximal to olecranon process 37.1  Olecranon process 30.3  15 cm proximal to ulnar styloid process 29.3  10 cm proximal to ulnar styloid process 25.3  Just proximal to ulnar styloid process 19.9  Across hand at thumb web space 22.6  At base of 2nd digit 7.5  (Blank rows = not tested)  LANDMARK LEFT  eval  At axilla    15 cm proximal to olecranon process 38.5  10 cm proximal to olecranon process 38.5  Olecranon process 30.8  15 cm proximal to ulnar styloid process 28.3  10 cm proximal to ulnar styloid process 24.8  Just proximal to ulnar styloid process 20  Across hand at thumb web space 23.0  At base of 2nd digit 7.3  (Blank rows = not tested)   Right arm  Left arm  41ml difference   LLIS ; 36.76  TREATMENT DATE:  01/31/24 Manual Therapy MLD to Rt breast: Superficial and deep abdominals, Rt inguinal nodes, Rt axillo-inguinal anastomosis, then focused on Rt breast, then had pt in Lt S/L for further focus to lateral aspect of breast, then finished retracing all steps in supine. Then also performed to Lt side as follows, Lt inguinal nodes, Lt axillo-inguinal anastomosis, and Lt UE working from proximal to distal and then retracting all steps.   01/25/24: Self Care Spent time at beginning of session instructing pt in compression bandaging for her Lt hand only today as she reports this is what fluctuates the most. Pt was able to return good demo. She video recorded therapist applying this after pt  returned demo, also issued handout for this.   Manual Therapy MLD to Rt breast: Superficial and deep abdominals, Rt inguinal nodes, Rt axillo-inguinal anastomosis, then focused on Rt breast, then had pt in Lt S/L for further focus to lateral aspect of breast, then finished retracing all steps in supine. Then also performed to Lt side as follows, Lt inguinal nodes, Lt axillo-inguinal anastomosis, and Lt UE working from proximal to distal and then retracting all steps.  STM to Rt axillary incision and lateral border of scapula where pt palpably very tight. This continued to soft by end of session.   01/20/24: Manual Therapy MLD to Rt breast: Superficial and deep abdominals, Rt inguinal nodes, Rt axillo-inguinal anastomosis, then focused on Rt breast, then had pt in Lt S/L for further focus to lateral aspect of breast and having pt return brief demo demonstrating how she will have increased ease of reach in this position, then finished retracing all steps in supine. Instructed pt in basics of anatomy of lymphatic system and principles of MLD while performing.  STM to Rt axillary incision and lateral border of scapula where pt palpably very tight. This softened well by end of session.   01/13/24 Eval performed Discussed POC options include bandaging, learning to bandage, getting new garments Pt decided on wanting to learn to bandage and get new garments 01/25/24 with Rodman Pickle comes.   Gave prescription for bra from Dr. Pamelia Hoit and discussed compression bra options and importance.     PATIENT EDUCATION:  Education details: Rt breast MLD Person educated: Patient Education method: Explanation, demonstration, handouts issued Education comprehension: verbalized understanding, returned brief demo, will benefit from further review  HOME EXERCISE PROGRAM: 01/20/24 - Rt breast self MLD (omitting SCF and only to Rt inguinals)  ASSESSMENT:  CLINICAL IMPRESSION: Pt will go to second to nature today.  Is wearing her compression glove or bandaging as able.   Reports her hand is still the worst part of the arm.  Continued POC today with MLD focus. Discussed pump - how pt could look into getting a new garment and how it is not optimal that her different pieces pump back and forth.  Pt is interested in pursuing this so I will tell Leah at [facility] to proceed.     OBJECTIVE IMPAIRMENTS: decreased activity tolerance, decreased knowledge of condition, decreased knowledge of use of DME, increased edema, and increased fascial restrictions.   ACTIVITY LIMITATIONS: carrying, lifting, and reach over head  PARTICIPATION LIMITATIONS: cleaning and community activity  PERSONAL FACTORS: 3+ comorbidities: disease status, bil node removal radiation x 2  are also affecting patient's functional outcome.   REHAB POTENTIAL: Good  CLINICAL DECISION MAKING: Evolving/moderate complexity  EVALUATION COMPLEXITY: Moderate  GOALS: Goals reviewed with patient? Yes  SHORT TERM GOALS: Target date: 01/26/24  Pt  will be compression for the breast and chest wall Baseline: Goal status: INITIAL  2.  Pt will be scheduled to get measured for new UE compression garments  Baseline:  Goal status: INITIAL   LONG TERM GOALS: Target date: 03/09/24  Pt will be ind with self bandaging of the Lt arm Baseline:  Goal status: INITIAL  2.  Pt will return to use of the pump without need for device modifications (may need to change flow patterns) Baseline:  Goal status: INITIAL  3.  Pt will report no resting pain in the Left hand  Baseline: 3-5/10 Goal status: INITIAL   PLAN:  PT FREQUENCY: 1-2x/week  PT DURATION: 6 weeks  PLANNED INTERVENTIONS: 97110-Therapeutic exercises, 97530- Therapeutic activity, 97535- Self Care, 16109- Manual therapy, 97760- Orthotic Fit/training, Patient/Family education, Taping, Therapeutic exercises, Therapeutic activity, Neuromuscular re-education, Gait training, and Self Care  PLAN FOR NEXT SESSION: Review Lt UE bandaging (hand is the  worst), Cont and review Rt breast MLD and Lt arm MLD towards inguinals only - omit cervical work Engineer, maintenance (IT) about bil cancer program and how pt has to switch arms on the garment for each side   Geovanna Simko, Julieanne Manson, PT 01/31/2024, 12:07 PM   Self manual lymph drainage: Perform this sequence once a day.  Only give enough pressure to your skin to make the skin move.  Diaphragmatic - Supine   Inhale through nose making navel move out toward hands. Exhale through puckered lips, hands follow navel in. Repeat _5__ times. Rest _10__ seconds between repeats.    LEG: Inguinal Nodes Stimulation   With small finger side of hand against hip crease on involved side, gently perform circles at the crease. Repeat __10_ times.   Copyright  VHI. All rights reserved.  Axilla to Inguinal Nodes - Sweep   On involved side, pump _4__ times from armpit along side of trunk to hip crease.   Draw an imaginary diagonal line from upper outer breast through the nipple area toward lower inner breast.  Direct fluid upward and outward from this line toward the pathway.       Direct fluid to treat all of lower outer breast tissue downward and outward toward  pathway that is aimed at the left groin. Lay on Left side for easier access to side of breast.   Finish by doing the pathways as described above going from your involved armpit to the same side groin.  Repeat the steps above where you do circles in your right groin  Cancer Rehab [phone]

## 2024-02-01 ENCOUNTER — Ambulatory Visit (HOSPITAL_COMMUNITY)
Admission: RE | Admit: 2024-02-01 | Discharge: 2024-02-01 | Disposition: A | Discharge status: Home / Self Care | Source: Ambulatory Visit | Attending: Hematology and Oncology | Admitting: Hematology and Oncology

## 2024-02-01 DIAGNOSIS — C771 Secondary and unspecified malignant neoplasm of intrathoracic lymph nodes: Secondary | ICD-10-CM | POA: Insufficient documentation

## 2024-02-01 DIAGNOSIS — N6331 Unspecified lump in axillary tail of the right breast: Secondary | ICD-10-CM | POA: Diagnosis present

## 2024-02-01 DIAGNOSIS — C50919 Malignant neoplasm of unspecified site of unspecified female breast: Secondary | ICD-10-CM | POA: Diagnosis present

## 2024-02-01 MED ORDER — SODIUM CHLORIDE (PF) 0.9 % IJ SOLN
INTRAMUSCULAR | Status: AC
  Filled 2024-02-01: qty 50

## 2024-02-01 MED ORDER — IOHEXOL 300 MG/ML  SOLN
100.0000 mL | Freq: Once | INTRAMUSCULAR | Status: AC | PRN
  Administered 2024-02-01: 75 mL via INTRAVENOUS

## 2024-02-02 ENCOUNTER — Ambulatory Visit

## 2024-02-02 DIAGNOSIS — C50919 Malignant neoplasm of unspecified site of unspecified female breast: Secondary | ICD-10-CM

## 2024-02-02 DIAGNOSIS — M79602 Pain in left arm: Secondary | ICD-10-CM

## 2024-02-02 DIAGNOSIS — I890 Lymphedema, not elsewhere classified: Secondary | ICD-10-CM

## 2024-02-02 DIAGNOSIS — Z483 Aftercare following surgery for neoplasm: Secondary | ICD-10-CM

## 2024-02-02 NOTE — Therapy (Signed)
 OUTPATIENT PHYSICAL THERAPY  UPPER EXTREMITY ONCOLOGY TREATMENT  Patient Name: Jessica Simpson MRN: [id number] DOB:July 05, 1971, 53 y.o., female Today's Date: 02/02/2024  END OF SESSION:  PT End of Session - 02/02/24 1211     Visit Number 5    Number of Visits 13    Date for PT Re-Evaluation 03/09/24    Authorization Type 8 visits 01/12/24-02/23/24    Authorization - Visit Number 5    Authorization - Number of Visits 8    PT Start Time 1206    PT Stop Time 1301    PT Time Calculation (min) 55 min    Activity Tolerance Patient tolerated treatment well    Behavior During Therapy [facility] for tasks assessed/performed             Past Medical History:  Diagnosis Date   BRCA1 negative 10/22/2011   BRCA2 negative 10/22/2011   Breast cancer, IDC, Left, Stage III, Triple negative 06/30/2011   Breast disorder    Contraceptive education 01/15/2016   Cough    Dyspnea    Family history of breast cancer    Fracture    right 4th toe   History of breast cancer 07/22/2014   History of radiation therapy 05/16/18- 06/22/18   Right Breast/ 50.4 Gy in 28 fractions. Right posterior axilla and SCV nodes/ 50.4 Gy in 28 fractions.    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 She then received a boost to the left lumpectomy of 10 gray in 5 fractions. This was given at [facility].   Sleep apnea    Status post chemotherapy Comp. 11/23/11   FEC and Taxotere   Past Surgical History:   Procedure Laterality Date   anal ascess     turned into a fistula with extensive treatment   AXILLARY LYMPH NODE BIOPSY Right 04/06/2018   AXILLARY LYMPH NODE DISSECTION Right 04/06/2018   Procedure: RIGHT AXILLARY LYMPH NODE DISSECTION;  Surgeon: Emelia Loron, MD;  Location: [facility] OR;  Service: General;  Laterality: Right;   BREAST BIOPSY Left 2012   BREAST BIOPSY Right 2019   BREAST LUMPECTOMY Left 2012   BREAST LUMPECTOMY Right 2019   rt axilla   BREAST SURGERY     CARDIAC ELECTROPHYSIOLOGY MAPPING AND ABLATION     CESAREAN SECTION     COLONOSCOPY N/A 08/14/2014   Procedure: COLONOSCOPY;  Surgeon: Malissa Hippo, MD;  Location: [facility];  Service: Endoscopy;  Laterality: N/A;  930   EVACUATION BREAST HEMATOMA  12/21/2011   Procedure: EVACUATION HEMATOMA BREAST;  Surgeon: Almond Lint, MD;  Location: [facility] OR;  Service: General;  Laterality: Left;   HAND SURGERY     tumor on finger, left hand   INCISION AND DRAINAGE ABSCESS ANAL     x3   IR IMAGING GUIDED PORT INSERTION  08/01/2023   left breast biopsy with axillary biopsy     core bx   PORT-A-CATH REMOVAL  12/21/2011   Procedure: REMOVAL  PORT-A-CATH;  Surgeon: Currie Paris, MD;  Location: [facility];  Service: General;  Laterality: N/A;   PORTACATH PLACEMENT  08/02/2011   dr Jamey Ripa   PORTACATH PLACEMENT Right 11/14/2017   Procedure: INSERTION PORT-A-CATH WITH [place];  Surgeon: Emelia Loron, MD;  Location: [name] OR;  Service: General;  Laterality: Right;   removal breast mass  2004   benign   TREATMENT FISTULA ANAL     TUMOR REMOVAL     on left hand   TUMOR REMOVAL     rt. 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   High degree atrioventricular block 11/15/2023   Secondary malignancy of mediastinal lymph nodes (HCC) 07/20/2023   PMB (postmenopausal bleeding) 01/26/2022   Papanicolaou smear of cervix with positive high risk  human papilloma virus (HPV) test 12/25/2020   Routine medical exam 12/19/2020   Encounter for screening fecal occult blood testing 12/19/2020   Hypertension 09/01/2018   Screening for colorectal cancer 09/01/2018   Encounter for gynecological examination with Papanicolaou smear of cervix 09/01/2018   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) 04/25/2018   Genetic testing 01/12/2018   Family history of breast cancer    Port-A-Cath in place 11/21/2017   Mass of axillary tail of right breast 08/24/2017   Pain with urination 08/24/2017   Urinary frequency 08/24/2017   Hematuria 08/24/2017   Encounter for well woman exam with routine gynecological exam 01/15/2016   Benign cyst of right breast 11/29/2015   Positive fecal occult blood test 07/22/2014   History of breast cancer 07/22/2014   Rectal bleeding 07/10/2014   Lymphedema 06/15/2012   BRCA1 negative 10/22/2011   BRCA2 negative 10/22/2011   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) 06/30/2011    Class: Diagnosis of    PCP: Ardean Larsen, MD  REFERRING PROVIDER: Serena Croissant, MD  REFERRING DIAG: lymphedema   THERAPY DIAG:  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, unspecified laterality (HCC)  Lymphedema  Aftercare following surgery for neoplasm  Pain in left arm  ONSET DATE: 2013  Rationale for Evaluation and Treatment: Rehabilitation  SUBJECTIVE:                                                                                                                                                                                           SUBJECTIVE STATEMENT:   I wrapped my hand one time and having the video helps. Today my Rt breast feels tight. I did get my compression bra and I'm getting used to  it. I just don't like wearing a bra but I know I need to .   PERTINENT HISTORY: Breast cancer Lt treated in 2012-2013 with chemotherapy/taxotere post lumpectomy and SLNB 3/7 nodes  and radiation with onset of lymphedema during radiation, treatment of lymphedema at [facility] in 2013. Reoccurence in Rt axillary lymph node with chemotherapy carboplatin and gemcitabine x 6 cycles and ALND 1/11 positive, radiation completed 06/19/18 on the Rt. Recurrence 2024 in the Rt sternal region - had more radiation to the breast and sternal area on the Rt side ending 08/26/23.  OnTrodelvy. Has pacemaker.  Cough and SOB from mass near heart and disease status/medication.   11/29/23 PET: Nodal mets throughout cervical region and Lt supraclavicular region, Rt axillary node and fat of Rt upper back. Concerning areas in the liver increasing mediastinal mass 11.6cm effacing the heart and sternum, a few scattered lung nodules  PAIN:  Are you having pain? No, not currently  PRECAUTIONS: active cancer, pacemaker, bil axillary node removal  RED FLAGS: None   WEIGHT BEARING RESTRICTIONS: No  FALLS:  Has patient fallen in last 6 months? No  LIVING ENVIRONMENT: Lives with: lives alone   OCCUPATION: not working  LEISURE: walking, things around the house   HAND DOMINANCE: right   PRIOR LEVEL OF FUNCTION: Independent with basic ADLs  PATIENT GOALS: Manage the hand swelling  and improve the breast.     OBJECTIVE: Note: Objective measures were completed at Evaluation unless otherwise noted.  COGNITION: Overall cognitive status: Within functional limits for tasks assessed   PALPATION: Rt breast is overall very tight with minimal skin movement, has a slight pink/red appearance, possible skin changes near clavicle, no pitting in the Lt UE just in the back of the hand.   OBSERVATIONS / OTHER ASSESSMENTS: SOB with talking, intermittent cough  POSTURE: WNL  UPPER EXTREMITY AROM/PROM: WFL but pt reports she feels generally tight - will measure  A/PROM RIGHT   eval  Right 02/02/24  Shoulder extension    Shoulder flexion  139  Shoulder abduction  134  Shoulder internal rotation     Shoulder external rotation      (Blank rows = not tested)  A/PROM LEFT   eval Left 02/02/24  Shoulder extension    Shoulder flexion  150  Shoulder abduction  144  Shoulder internal rotation    Shoulder external rotation      (Blank rows = not tested)  CERVICAL AROM: All within normal limits:   UPPER EXTREMITY STRENGTH: not tested   LYMPHEDEMA ASSESSMENTS:   LANDMARK RIGHT  eval  At axilla    15 cm proximal to olecranon process 38.5  10 cm proximal to olecranon process 37.1  Olecranon process 30.3  15 cm proximal to ulnar styloid process 29.3  10 cm proximal to ulnar styloid process 25.3  Just proximal to ulnar styloid process 19.9  Across hand at thumb web space 22.6  At base of 2nd digit 7.5  (Blank rows = not tested)  LANDMARK LEFT  eval  At axilla    15 cm proximal to olecranon process 38.5  10 cm proximal to olecranon process 38.5  Olecranon process 30.8  15 cm proximal to ulnar styloid process 28.3  10 cm proximal to ulnar styloid process 24.8  Just proximal to ulnar styloid process 20  Across hand at thumb web space 23.0  At base of 2nd digit 7.3  (Blank rows = not tested)   Right arm  Left arm  41ml difference   LLIS ; 36.76                                                                                                                            TREATMENT DATE:  02/02/24: Manual Therapy MLD to Rt breast: Superficial and deep abdominals, Rt inguinal nodes, Rt axillo-inguinal anastomosis, then focused on Rt breast, then had pt in Lt S/L for further focus to lateral aspect of breast, then finished retracing all steps in supine. Then also performed to Lt side as follows, Lt inguinal nodes, Lt axillo-inguinal anastomosis, and Lt UE working from proximal to distal and then retracting all steps.  P/ROM to Rt shoulder into flex, abd and D2 with scapular depression by therapist STM to Rt lateral trunk and inferior to axilla  01/31/24 Manual  Therapy MLD to Rt breast: Superficial and deep abdominals, Rt inguinal nodes, Rt axillo-inguinal anastomosis, then focused on Rt breast, then had pt in Lt S/L for further focus to lateral aspect of breast, then finished retracing all steps in supine. Then also performed to Lt side as follows, Lt inguinal nodes, Lt axillo-inguinal anastomosis, and Lt UE working from proximal to distal and then retracting all steps.   01/25/24: Self Care Spent time at beginning of session instructing pt in compression bandaging for her Lt hand only today as she reports this is what fluctuates the most. Pt was able to return good demo. She video recorded therapist applying this after pt  returned demo, also issued handout for this.  Manual Therapy MLD to Rt breast: Superficial and deep abdominals, Rt inguinal nodes, Rt axillo-inguinal anastomosis, then focused on Rt breast, then had pt in Lt S/L for further focus to lateral aspect of breast, then finished retracing all steps in supine. Then also performed to Lt side as follows, Lt inguinal nodes, Lt axillo-inguinal anastomosis, and Lt UE working from proximal to distal and then retracting all steps.  STM to Rt axillary incision and lateral border of scapula where pt palpably very tight. This continued to soft by end of session.        PATIENT EDUCATION:  Education details: Rt breast MLD Person educated: Patient Education method: Explanation, demonstration, handouts issued Education comprehension: verbalized understanding, returned brief demo, will benefit from further review  HOME EXERCISE PROGRAM: 01/20/24 - Rt breast self MLD (omitting SCF and only to Rt inguinals)  ASSESSMENT:  CLINICAL IMPRESSION: Pt reports new flat knit garments have yet to arrive for Lt UE so today comes wearing her old flat knit glove. Today continued with MLD to Rt breast with manual therapist working to decrease her Rt upper quadrant tightness. Then also continued with Lt UE MLD. Pts  cough was increased in freq today, but she feels like this is due to being on her front porch for a bit this morning. During session noticed that pt has some new raised areas/bumps under her skin at center of chest to the Lt of her  port. Advised pt that with her history and since this is new she needs to update her doctor. There was no redness, heat or pain noted or reported. She does report some itchiness here since she noticed them over past few days. Pt verbalized understanding and agreed to update her doctor.   OBJECTIVE IMPAIRMENTS: decreased activity tolerance, decreased knowledge of condition, decreased knowledge of use of DME, increased edema, and increased fascial restrictions.   ACTIVITY LIMITATIONS: carrying, lifting, and reach over head  PARTICIPATION LIMITATIONS: cleaning and community activity  PERSONAL FACTORS: 3+ comorbidities: disease status, bil node removal radiation x 2  are also affecting patient's functional outcome.   REHAB POTENTIAL: Good  CLINICAL DECISION MAKING: Evolving/moderate complexity  EVALUATION COMPLEXITY: Moderate  GOALS: Goals reviewed with patient? Yes  SHORT TERM GOALS: Target date: 01/26/24  Pt will be compression for the breast and chest wall Baseline: Goal status: INITIAL  2.  Pt will be scheduled to get measured for new UE compression garments  Baseline:  Goal status: INITIAL   LONG TERM GOALS: Target date: 03/09/24  Pt will be ind with self bandaging of the Lt arm Baseline:  Goal status: INITIAL  2.  Pt will return to use of the pump without need for device modifications (may need to change flow patterns) Baseline:  Goal status: INITIAL  3.  Pt will report no resting pain in the Left hand  Baseline: 3-5/10 Goal status: INITIAL   PLAN:  PT FREQUENCY: 1-2x/week  PT DURATION: 6 weeks  PLANNED INTERVENTIONS: 97110-Therapeutic exercises, 97530- Therapeutic activity, 97535- Self Care, 16109- Manual therapy, 97760- Orthotic  Fit/training, Patient/Family education, Taping, Therapeutic exercises, Therapeutic activity, Neuromuscular re-education, Gait training, and Self Care  PLAN FOR NEXT SESSION: Did pt hear back from doctor about new raised areas on her chest? Review Lt UE bandaging (hand is the worst), Cont and review Rt breast MLD and Lt arm MLD towards inguinals only - omit cervical work Engineer, maintenance (IT) about bil cancer program and how pt has to switch arms on the garment for each side   Hermenia Bers, PTA 02/02/2024, 1:20 PM   Self manual lymph drainage: Perform this sequence once a day.  Only give enough pressure to your skin to make the skin move.  Diaphragmatic - Supine   Inhale through nose making navel move out toward hands. Exhale through puckered lips, hands follow navel in. Repeat _5__ times. Rest _10__ seconds between repeats.    LEG: Inguinal Nodes Stimulation   With small finger side of hand against hip crease on involved side, gently perform circles at the crease. Repeat __10_ times.   Copyright  VHI. All rights reserved.  Axilla to Inguinal Nodes - Sweep   On involved side, pump _4__ times from armpit along side of trunk to hip crease.   Draw an imaginary diagonal line from upper outer breast through the nipple area toward lower inner breast.  Direct fluid upward and outward from this line toward the pathway.       Direct fluid to treat all of lower outer breast tissue downward and outward toward  pathway that is aimed at the left groin. Lay on Left side for easier access to side of breast.   Finish by doing the pathways as described above going from your involved armpit to the same side groin.  Repeat the steps above where you do circles in your right groin  Cancer Rehab [phone]

## 2024-02-04 ENCOUNTER — Inpatient Hospital Stay: Attending: Hematology and Oncology

## 2024-02-04 VITALS — BP 119/87 | HR 100 | Temp 97.6°F | Resp 17

## 2024-02-04 DIAGNOSIS — J700 Acute pulmonary manifestations due to radiation: Secondary | ICD-10-CM | POA: Diagnosis not present

## 2024-02-04 DIAGNOSIS — J309 Allergic rhinitis, unspecified: Secondary | ICD-10-CM | POA: Diagnosis not present

## 2024-02-04 DIAGNOSIS — Z79899 Other long term (current) drug therapy: Secondary | ICD-10-CM | POA: Diagnosis not present

## 2024-02-04 DIAGNOSIS — Z5111 Encounter for antineoplastic chemotherapy: Secondary | ICD-10-CM | POA: Insufficient documentation

## 2024-02-04 DIAGNOSIS — Y842 Radiological procedure and radiotherapy as the cause of abnormal reaction of the patient, or of later complication, without mention of misadventure at the time of the procedure: Secondary | ICD-10-CM | POA: Diagnosis not present

## 2024-02-04 DIAGNOSIS — C771 Secondary and unspecified malignant neoplasm of intrathoracic lymph nodes: Secondary | ICD-10-CM | POA: Insufficient documentation

## 2024-02-04 DIAGNOSIS — Z171 Estrogen receptor negative status [ER-]: Secondary | ICD-10-CM | POA: Diagnosis not present

## 2024-02-04 DIAGNOSIS — R5383 Other fatigue: Secondary | ICD-10-CM | POA: Insufficient documentation

## 2024-02-04 DIAGNOSIS — Z923 Personal history of irradiation: Secondary | ICD-10-CM | POA: Insufficient documentation

## 2024-02-04 DIAGNOSIS — Z9221 Personal history of antineoplastic chemotherapy: Secondary | ICD-10-CM | POA: Insufficient documentation

## 2024-02-04 DIAGNOSIS — N6331 Unspecified lump in axillary tail of the right breast: Secondary | ICD-10-CM

## 2024-02-04 DIAGNOSIS — C50012 Malignant neoplasm of nipple and areola, left female breast: Secondary | ICD-10-CM | POA: Diagnosis present

## 2024-02-04 DIAGNOSIS — C50919 Malignant neoplasm of unspecified site of unspecified female breast: Secondary | ICD-10-CM

## 2024-02-04 DIAGNOSIS — Z5189 Encounter for other specified aftercare: Secondary | ICD-10-CM | POA: Diagnosis not present

## 2024-02-04 DIAGNOSIS — L299 Pruritus, unspecified: Secondary | ICD-10-CM | POA: Insufficient documentation

## 2024-02-04 MED ORDER — FILGRASTIM-SNDZ 480 MCG/0.8ML IJ SOSY
480.0000 ug | PREFILLED_SYRINGE | Freq: Once | INTRAMUSCULAR | Status: AC
  Administered 2024-02-04: 480 ug via SUBCUTANEOUS

## 2024-02-06 MED FILL — Fosaprepitant Dimeglumine For IV Infusion 150 MG (Base Eq): INTRAVENOUS | Qty: 5 | Status: AC

## 2024-02-07 ENCOUNTER — Encounter: Payer: Self-pay | Admitting: Hematology and Oncology

## 2024-02-07 ENCOUNTER — Inpatient Hospital Stay

## 2024-02-07 ENCOUNTER — Other Ambulatory Visit: Payer: Self-pay

## 2024-02-07 ENCOUNTER — Inpatient Hospital Stay (HOSPITAL_BASED_OUTPATIENT_CLINIC_OR_DEPARTMENT_OTHER): Admitting: Hematology and Oncology

## 2024-02-07 VITALS — BP 135/91 | HR 81 | Temp 97.7°F | Resp 18 | Ht 66.0 in | Wt 202.4 lb

## 2024-02-07 DIAGNOSIS — C50012 Malignant neoplasm of nipple and areola, left female breast: Secondary | ICD-10-CM | POA: Diagnosis not present

## 2024-02-07 DIAGNOSIS — C50919 Malignant neoplasm of unspecified site of unspecified female breast: Secondary | ICD-10-CM | POA: Diagnosis not present

## 2024-02-07 DIAGNOSIS — C771 Secondary and unspecified malignant neoplasm of intrathoracic lymph nodes: Secondary | ICD-10-CM

## 2024-02-07 DIAGNOSIS — Z95828 Presence of other vascular implants and grafts: Secondary | ICD-10-CM

## 2024-02-07 DIAGNOSIS — N6331 Unspecified lump in axillary tail of the right breast: Secondary | ICD-10-CM

## 2024-02-07 LAB — CMP (CANCER CENTER ONLY)
ALT: 9 U/L (ref 0–44)
AST: 13 U/L — ABNORMAL LOW (ref 15–41)
Albumin: 4.2 g/dL (ref 3.5–5.0)
Alkaline Phosphatase: 98 U/L (ref 38–126)
Anion gap: 7 (ref 5–15)
BUN: 19 mg/dL (ref 6–20)
CO2: 27 mmol/L (ref 22–32)
Calcium: 9.6 mg/dL (ref 8.9–10.3)
Chloride: 105 mmol/L (ref 98–111)
Creatinine: 0.92 mg/dL (ref 0.44–1.00)
GFR, Estimated: >60 mL/min (ref >60)
Glucose, Bld: 103 mg/dL — ABNORMAL HIGH (ref 70–99)
Potassium: 3.6 mmol/L (ref 3.5–5.1)
Sodium: 139 mmol/L (ref 135–145)
Total Bilirubin: 0.4 mg/dL (ref 0.0–1.2)
Total Protein: 7.3 g/dL (ref 6.5–8.1)

## 2024-02-07 LAB — CBC WITH DIFFERENTIAL (CANCER CENTER ONLY)
Abs Immature Granulocytes: 0.06 10*3/uL (ref 0.00–0.07)
Basophils Absolute: 0 10*3/uL (ref 0.0–0.1)
Basophils Relative: 1 %
Eosinophils Absolute: 0 10*3/uL (ref 0.0–0.5)
Eosinophils Relative: 1 %
HCT: 33.8 % — ABNORMAL LOW (ref 36.0–46.0)
Hemoglobin: 11.1 g/dL — ABNORMAL LOW (ref 12.0–15.0)
Immature Granulocytes: 1 %
Lymphocytes Relative: 8 %
Lymphs Abs: 0.3 10*3/uL — ABNORMAL LOW (ref 0.7–4.0)
MCH: 28.8 pg (ref 26.0–34.0)
MCHC: 32.8 g/dL (ref 30.0–36.0)
MCV: 87.6 fL (ref 80.0–100.0)
Monocytes Absolute: 0.4 10*3/uL (ref 0.1–1.0)
Monocytes Relative: 8 %
Neutro Abs: 3.3 10*3/uL (ref 1.7–7.7)
Neutrophils Relative %: 81 %
Platelet Count: 110 10*3/uL — ABNORMAL LOW (ref 150–400)
RBC: 3.86 MIL/uL — ABNORMAL LOW (ref 3.87–5.11)
RDW: 17.2 % — ABNORMAL HIGH (ref 11.5–15.5)
WBC Count: 4.2 10*3/uL (ref 4.0–10.5)
nRBC: 0 % (ref 0.0–0.2)

## 2024-02-07 LAB — MAGNESIUM: Magnesium: 2 mg/dL (ref 1.7–2.4)

## 2024-02-07 LAB — PHOSPHORUS: Phosphorus: 3.6 mg/dL (ref 2.5–4.6)

## 2024-02-07 MED ORDER — SACITUZUMAB GOVITECAN-HZIY CHEMO 180 MG IV SOLR
460.0000 mg | Freq: Once | INTRAVENOUS | Status: AC
  Administered 2024-02-07: 460 mg via INTRAVENOUS
  Filled 2024-02-07: qty 46

## 2024-02-07 MED ORDER — LORATADINE 10 MG PO TABS: 10.0000 mg | ORAL_TABLET | Freq: Every day | ORAL | 2 refills | Status: DC | PRN

## 2024-02-07 MED ORDER — FAMOTIDINE IN NACL 20-0.9 MG/50ML-% IV SOLN
20.0000 mg | Freq: Once | INTRAVENOUS | Status: AC
  Administered 2024-02-07: 20 mg via INTRAVENOUS
  Filled 2024-02-07: qty 50

## 2024-02-07 MED ORDER — SODIUM CHLORIDE 0.9 % IV SOLN
150.0000 mg | Freq: Once | INTRAVENOUS | Status: AC
  Administered 2024-02-07: 150 mg via INTRAVENOUS
  Filled 2024-02-07: qty 150

## 2024-02-07 MED ORDER — SODIUM CHLORIDE 0.9 % IV SOLN
Freq: Once | INTRAVENOUS | Status: AC
Start: 2024-02-07 — End: 2024-02-07

## 2024-02-07 MED ORDER — ACETAMINOPHEN 325 MG PO TABS
650.0000 mg | ORAL_TABLET | Freq: Once | ORAL | Status: AC
  Administered 2024-02-07: 650 mg via ORAL
  Filled 2024-02-07: qty 2

## 2024-02-07 MED ORDER — PALONOSETRON HCL INJECTION 0.25 MG/5ML
0.2500 mg | Freq: Once | INTRAVENOUS | Status: AC
  Administered 2024-02-07: 0.25 mg via INTRAVENOUS
  Filled 2024-02-07: qty 5

## 2024-02-07 MED ORDER — DEXAMETHASONE SODIUM PHOSPHATE 10 MG/ML IJ SOLN
10.0000 mg | Freq: Once | INTRAMUSCULAR | Status: AC
  Administered 2024-02-07: 10 mg via INTRAVENOUS
  Filled 2024-02-07: qty 1

## 2024-02-07 MED ORDER — SODIUM CHLORIDE 0.9% FLUSH
10.0000 mL | Freq: Once | INTRAVENOUS | Status: AC
  Administered 2024-02-07: 10 mL

## 2024-02-07 MED ORDER — DIPHENHYDRAMINE HCL 50 MG/ML IJ SOLN
50.0000 mg | Freq: Once | INTRAMUSCULAR | Status: AC
  Administered 2024-02-07: 50 mg via INTRAVENOUS
  Filled 2024-02-07: qty 1

## 2024-02-07 NOTE — Progress Notes (Signed)
 Continue Trodelvy 5mg /kg per Dr. Gudena. Order updated.   Orian Figueira, PharmD, MBA

## 2024-02-07 NOTE — Assessment & Plan Note (Signed)
 Prior treatment: Verzinio with letrozole started 06/08/2023-07/20/2023   CT CAP: Increasing mediastinal mass 11.6 cm (used to be 10.8 cm) effacing the heart and involving the sternum, additional mediastinal/pericardial lymph nodes increased right extra lymph node 1.5 cm, nodules 0.2 cm (used to be 0.8 cm), few new scattered lung nodules)  CT CAP 09/20/2023: Decrease in the size of the anterior mediastinal mass that effaces the heart and invades the sternum/chest wall, decreased size of the pleural-based left lower lobe lung nodule.  Scattered pulmonary nodules not visualized.  Decreased hepatic metastases.  Stable T1    Treatment plan: Jessica Simpson started 07/25/2023, today is cycle 10     Trodelvy toxicities: Fatigue: multifactorial related to chemo and pacemaker  Hypomagnesemia: Oral magnesium supplementation  Diarrhea.  Neutropenia: Patient gets Granix injections prior to each treatment.  Today's ANC is 1000.  I reduced the dosage of today's chemo and she is okay to proceed.  I added a Granix injection prior to every single treatment.   PET/CT 11/29/2023: Progression of nodal metastases in the cervical region and supraclavicular region.  Hypermetabolic right axillary lymph node and soft tissue metastases in the subcutaneous fat of the right upper back.  Regression of the anterior mediastinal mass with near complete regression of metabolic activity.  Activity of the porta hepatis.    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  Liver MRI 01/18/2024: Upper abdominal lymphadenopathy (1.6 cm lymph node at the porta hepatis and 1.3 cm portacaval lymph node corresponding to PET findings) CT chest 02/03/2024: Prevascular mass stable to minimally decreased, lytic destruction of mid sternum as before, left thoracic inlet and upper abdominal lymph nodes as before. CT neck 02/04/2024: Mildly enlarged bilateral cervical and left supraclavicular lymph nodes

## 2024-02-07 NOTE — Progress Notes (Signed)
 Patient Care Team: Serita Danes, MD as PCP - General (Family Medicine) Cameron Cea, MD as Consulting Physician (Hematology and Oncology) Pickenpack-Cousar, Giles Labrum, NP as Nurse Practitioner [facility] and Palliative Medicine)  DIAGNOSIS:  Encounter Diagnosis  Name Primary?   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)  06/30/2011 Initial Diagnosis   Breast cancer, IDC, Left, Stage III, Triple negative, 7.6 cm breast mass and palpable axillary mass   09/06/2011 Miscellaneous   BRCA 1 and 2: Negative   09/14/2011 - 11/23/2011 Neo-Adjuvant Chemotherapy   Dose dense FEC followed by dose dense Taxotere   12/21/2011 Surgery   Left lumpectomy: High-grade poorly differentiated IDC 1.7 cm with high-grade DCIS, margins negative, 3/7 lymph nodes positive, ER 0%, PR 0%, HER-2 negative ratio 1.33 T1CN1 stage IIb   12/31/2011 - 02/15/2012 Radiation Therapy   Radiation at [facility]   09/26/2017 Relapse/Recurrence   Left breast upper outer quadrant within the lumpectomy bed: Fibrosis no malignancy, right axillary lymph node biopsy metastatic high-grade carcinoma ER 0%, PR 0%, Ki-67 90%, HER-2 negative ratio 1.11 (similar to previous ductal carcinoma)   11/14/2017 - 03/06/2018 Neo-Adjuvant Chemotherapy   Neo-Adjuvant chemotherapy with Gemzar and carboplatin days 1 and 8 every 3 weeks   01/10/2018 Genetic Testing   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  The Hereditary Gene Panel offered by [facility] includes sequencing and/or deletion duplication testing of the following 47 genes: APC, ATM, AXIN2, BARD1, BMPR1A, BRCA1, BRCA2, BRIP1, CDH1, CDK4, CDKN2A (p14ARF), CDKN2A (p16INK4a), CHEK2, CTNNA1, DICER1, EPCAM (Deletion/duplication testing only), GREM1 (promoter region deletion/duplication testing  only), KIT, MEN1, MLH1, MSH2, MSH3, MSH6, MUTYH, NBN, NF1, NHTL1, PALB2, PDGFRA, PMS2, POLD1, POLE, PTEN, RAD50, RAD51C, RAD51D, SDHB, SDHC, SDHD, SMAD4, SMARCA4. STK11, TP53, TSC1, TSC2, and VHL.  The following genes were evaluated for sequence changes only: SDHA and HOXB13 c.251G>A variant only. The report date is January 10, 2018.    05/31/2023 Procedure   Mediastinal mass biopsy: Metastatic poorly differentiated carcinoma compatible with breast primary ER +60%, PR 0%, HER2 0, Ki-67 40%    06/02/2023 PET scan   PET/CT Anterior mediastinal mass is hypermetabolic and centrally necrotic, soft tissue lesions anterior to the heart, 10 mm pleural-based lesion, level 2 cervical nodes, hypermetabolism hepatic dome mottled FDG accumulation throughout the skeletal system    06/06/2023 - 07/14/2023 Anti-estrogen oral therapy   Antiestrogen therapy with Verzenio and letrozole    07/07/2023 Relapse/Recurrence   Increasing mediastinal mass 11.6 cm (used to be 10.8 cm) effacing the heart and involving the sternum, additional mediastinal/pericardial lymph nodes increased right extra lymph node 1.5 cm, nodules 0.2 cm (used to be 0.8 cm), few new scattered lung nodules)   07/25/2023 -  Chemotherapy   Patient is on Treatment Plan : BREAST METASTATIC Sacituzumab govitecan-hziy Chaya Cord) D1,8 q21d     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)  04/06/2018 Surgery   Right axillary lymph node dissection: 1/11 node positive for metastatic high-grade carcinoma   04/25/2018 Initial Diagnosis   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)   04/25/2018 Cancer Staging   Staging form: Breast, AJCC 8th Edition - Clinical: Stage IIB (cT0, cN1, cM0, G3, ER-, PR-, HER2-) - Signed by Colie Dawes, MD on 04/25/2018   05/16/2018 - 06/22/2018 Radiation Therapy   Adjuvant radiation therapy with Xeloda   07/11/2018 -  10/24/2018 Chemotherapy   Adjuvant chemotherapy with CMF     CHIEF COMPLIANT: Cutaneous lesions in  the chest and to follow-up on scans.  HISTORY OF PRESENT ILLNESS:   History of Present Illness The patient, with a history of breast cancer, presents with new skin lesions that have been itching. She noticed the lesions recently, counting approximately eight in total. She is concerned that these lesions may be a sign of metastatic breast cancer. The patient also reports a cough, which has improved, and fatigue, which has been fluctuating but seems to be worsening recently. She has been using hydrocortisone to manage the itching from the skin lesions. The patient also reports some discomfort around the ear, but denies fever. She has been managing allergies with loratadine, which she requests a renewal for.     ALLERGIES:  is allergic to codeine and hydrocodone-acetaminophen.  MEDICATIONS:  Current Outpatient Medications  Medication Sig Dispense Refill   albuterol (ACCUNEB) 0.63 MG/3ML nebulizer solution Take 3 mLs (0.63 mg total) by nebulization every 6 (six) hours as needed for wheezing. 75 mL 12   amLODipine (NORVASC) 5 MG tablet Take 10 mg by mouth daily.  6   Cyanocobalamin (VITAMIN B-12) 5000 MCG TBDP Take 5,000 mcg by mouth daily.     diphenoxylate-atropine (LOMOTIL) 2.5-0.025 MG tablet Take 1-2 tablets by mouth 4 (four) times daily as needed for diarrhea or loose stools. 60 tablet 0   lidocaine-prilocaine (EMLA) cream Apply to affected area once 30 g 3   LORazepam (ATIVAN) 1 MG tablet Take 1 tablet (1 mg total) by mouth every 8 (eight) hours. 10 tablet 0   magnesium oxide (MAG-OX) 400 MG tablet TAKE 1 TABLET BY MOUTH EVERY DAY 30 tablet 1   metoprolol succinate (TOPROL XL) 25 MG 24 hr tablet Take 1 tablet (25 mg total) by mouth daily. 90 tablet 3   Omega-3 Fatty Acids (FISH OIL MAXIMUM STRENGTH) 1200 MG CPDR Take 1,200 mg by mouth daily.     Vitamin D, Ergocalciferol, (DRISDOL) 1.25 MG (50000 UNIT) CAPS capsule Take 1 capsule (50,000 Units total) by mouth once a week. 12 capsule 2    acetaminophen (TYLENOL) 500 MG tablet Take 500 mg by mouth every 6 (six) hours as needed. For pain (Patient not taking: Reported on 02/07/2024)     ibuprofen (ADVIL) 800 MG tablet Take 1 tablet (800 mg total) by mouth every 8 (eight) hours as needed. (Patient not taking: Reported on 02/07/2024) 30 tablet 0   loratadine (CLARITIN) 10 MG tablet Take 1 tablet (10 mg total) by mouth daily as needed for allergies. 90 tablet 2   ondansetron (ZOFRAN) 8 MG tablet Take 1 tablet (8 mg total) by mouth 2 (two) times daily. (Patient not taking: Reported on 02/07/2024) 60 tablet 6   prochlorperazine (COMPAZINE) 10 MG tablet Take 1 tablet (10 mg total) by mouth every 6 (six) hours as needed for nausea or vomiting. (Patient not taking: Reported on 02/07/2024) 30 tablet 6   No current facility-administered medications for this visit.    PHYSICAL EXAMINATION: ECOG PERFORMANCE STATUS: 1 - Symptomatic but completely ambulatory  Vitals:   02/07/24 1000  BP: (!) 135/91  Pulse: 81  Resp: 18  Temp: 97.7 F (36.5 C)  SpO2: 99%   Filed Weights   02/07/24 1000  Weight: 202 lb 6.4 oz (91.8 kg)    Physical Exam   (exam performed in the presence of a chaperone)  LABORATORY DATA:  I have reviewed the data as listed  Latest Ref Rng & Units 02/07/2024   10:00 AM 01/23/2024    8:45 AM 01/17/2024    8:04 AM  CMP  Glucose 70 - 99 mg/dL 782  94  956   BUN 6 - 20 mg/dL 19  16  15    Creatinine 0.44 - 1.00 mg/dL 2.13  0.86  5.78   Sodium 135 - 145 mmol/L 139  140  140   Potassium 3.5 - 5.1 mmol/L 3.6  3.6  3.4   Chloride 98 - 111 mmol/L 105  102  102   CO2 22 - 32 mmol/L 27  30  31    Calcium 8.9 - 10.3 mg/dL 9.6  9.3  9.2   Total Protein 6.5 - 8.1 g/dL 7.3  7.1  7.1   Total Bilirubin 0.0 - 1.2 mg/dL 0.4  0.3  0.4   Alkaline Phos 38 - 126 U/L 98  97  95   AST 15 - 41 U/L 13  12  13    ALT 0 - 44 U/L 9  11  10      Lab Results  Component Value Date   WBC 4.2 02/07/2024   HGB 11.1 (L) 02/07/2024   HCT 33.8  (L) 02/07/2024   MCV 87.6 02/07/2024   PLT 110 (L) 02/07/2024   NEUTROABS 3.3 02/07/2024    ASSESSMENT & PLAN:  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  Scattered pulmonary nodules not visualized.  Decreased hepatic metastases.  Stable T1    Treatment plan: [name] started 07/25/2023, today is cycle 10     Trodelvy toxicities: Fatigue: multifactorial related to chemo and pacemaker  Hypomagnesemia: Oral magnesium supplementation  Diarrhea.  Neutropenia: Patient gets Granix injections prior to each treatment.  Today's ANC is 1000.  I reduced the dosage of today's chemo and she is okay to proceed.  I added a Granix injection prior to every single treatment.   PET/CT 11/29/2023: Progression of nodal metastases in the cervical region and supraclavicular region.  Hypermetabolic right axillary lymph node and soft tissue metastases in the subcutaneous fat of the right upper back.  Regression of the anterior mediastinal mass with near complete regression of metabolic activity.  Activity of the porta hepatis.    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  Liver MRI 01/18/2024: Upper abdominal lymphadenopathy (1.6 cm lymph node at the porta hepatis and 1.3 cm portacaval lymph node corresponding to PET findings) CT chest 02/03/2024: Prevascular mass stable to minimally decreased, lytic destruction of mid  sternum as before, left thoracic inlet and upper abdominal lymph nodes as before. CT neck 02/04/2024: Mildly enlarged bilateral cervical and left supraclavicular lymph nodes  Cutaneous lesions on the chest: I will request Dr. Dwain Sarna to get a biopsy. We will assess her in 3 weeks to see if we need to switch treatment or we can continue with the same.  Based on radiology findings she appears to have stable disease. ------------------------------------- Assessment and Plan Assessment & Plan Skin  lesions New skin lesions with pruritus and burning. Differential includes metastasis or radiation changes. Biopsy needed to determine nature. - Refer to dermatology for skin punch biopsy. - Coordinate with Dr. Lurena Sally for evaluation based on biopsy results.  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  Allergic rhinitis Ongoing symptoms likely related to pollen season. - Prescribe loratadine 90 tablets.  Fatigue Intermittent fatigue possibly related to increased activity. Stable blood counts suggest no hematologic cause.  Follow-up Follow-up appointments and treatment schedule discussed. - Schedule injection appointment on Saturday. - Schedule day 8 infusion on April 22. - Schedule follow-up appointment on May 6 to discuss biopsy results and potential treatment changes.      No orders of the defined types were placed in this encounter.  The patient has a good understanding of the overall plan. she agrees with it. she will call with any problems that may develop before the next visit here. Total time spent: 30 mins including face to face time and time spent for planning, charting and co-ordination of care   Viinay K Kamyia Thomason, MD 02/07/24

## 2024-02-11 ENCOUNTER — Inpatient Hospital Stay

## 2024-02-11 VITALS — BP 138/89 | HR 103 | Temp 98.1°F | Resp 17

## 2024-02-11 DIAGNOSIS — N6331 Unspecified lump in axillary tail of the right breast: Secondary | ICD-10-CM

## 2024-02-11 DIAGNOSIS — C50919 Malignant neoplasm of unspecified site of unspecified female breast: Secondary | ICD-10-CM

## 2024-02-11 DIAGNOSIS — C50012 Malignant neoplasm of nipple and areola, left female breast: Secondary | ICD-10-CM | POA: Diagnosis not present

## 2024-02-11 MED ORDER — FILGRASTIM-SNDZ 480 MCG/0.8ML IJ SOSY
480.0000 ug | PREFILLED_SYRINGE | Freq: Once | INTRAMUSCULAR | Status: AC
  Administered 2024-02-11: 480 ug via SUBCUTANEOUS

## 2024-02-12 ENCOUNTER — Ambulatory Visit

## 2024-02-13 MED FILL — Fosaprepitant Dimeglumine For IV Infusion 150 MG (Base Eq): INTRAVENOUS | Qty: 5 | Status: AC

## 2024-02-14 ENCOUNTER — Inpatient Hospital Stay

## 2024-02-14 VITALS — BP 149/93 | HR 109 | Temp 98.2°F | Resp 18 | Wt 204.2 lb

## 2024-02-14 DIAGNOSIS — C50919 Malignant neoplasm of unspecified site of unspecified female breast: Secondary | ICD-10-CM

## 2024-02-14 DIAGNOSIS — Z95828 Presence of other vascular implants and grafts: Secondary | ICD-10-CM

## 2024-02-14 DIAGNOSIS — N6331 Unspecified lump in axillary tail of the right breast: Secondary | ICD-10-CM

## 2024-02-14 DIAGNOSIS — C50012 Malignant neoplasm of nipple and areola, left female breast: Secondary | ICD-10-CM | POA: Diagnosis not present

## 2024-02-14 LAB — CBC WITH DIFFERENTIAL (CANCER CENTER ONLY)
Abs Immature Granulocytes: 0.07 10*3/uL (ref 0.00–0.07)
Basophils Absolute: 0 10*3/uL (ref 0.0–0.1)
Basophils Relative: 0 %
Eosinophils Absolute: 0 10*3/uL (ref 0.0–0.5)
Eosinophils Relative: 1 %
HCT: 33.6 % — ABNORMAL LOW (ref 36.0–46.0)
Hemoglobin: 11.3 g/dL — ABNORMAL LOW (ref 12.0–15.0)
Immature Granulocytes: 2 %
Lymphocytes Relative: 6 %
Lymphs Abs: 0.3 10*3/uL — ABNORMAL LOW (ref 0.7–4.0)
MCH: 29.4 pg (ref 26.0–34.0)
MCHC: 33.6 g/dL (ref 30.0–36.0)
MCV: 87.3 fL (ref 80.0–100.0)
Monocytes Absolute: 0.2 10*3/uL (ref 0.1–1.0)
Monocytes Relative: 5 %
Neutro Abs: 4 10*3/uL (ref 1.7–7.7)
Neutrophils Relative %: 86 %
Platelet Count: 102 10*3/uL — ABNORMAL LOW (ref 150–400)
RBC: 3.85 MIL/uL — ABNORMAL LOW (ref 3.87–5.11)
RDW: 16.8 % — ABNORMAL HIGH (ref 11.5–15.5)
WBC Count: 4.7 10*3/uL (ref 4.0–10.5)
nRBC: 0 % (ref 0.0–0.2)

## 2024-02-14 LAB — PHOSPHORUS: Phosphorus: 3.4 mg/dL (ref 2.5–4.6)

## 2024-02-14 LAB — CMP (CANCER CENTER ONLY)
ALT: 10 U/L (ref 0–44)
AST: 13 U/L — ABNORMAL LOW (ref 15–41)
Albumin: 4.2 g/dL (ref 3.5–5.0)
Alkaline Phosphatase: 106 U/L (ref 38–126)
Anion gap: 8 (ref 5–15)
BUN: 14 mg/dL (ref 6–20)
CO2: 28 mmol/L (ref 22–32)
Calcium: 9.3 mg/dL (ref 8.9–10.3)
Chloride: 103 mmol/L (ref 98–111)
Creatinine: 0.73 mg/dL (ref 0.44–1.00)
GFR, Estimated: >60 mL/min (ref >60)
Glucose, Bld: 112 mg/dL — ABNORMAL HIGH (ref 70–99)
Potassium: 3.4 mmol/L — ABNORMAL LOW (ref 3.5–5.1)
Sodium: 139 mmol/L (ref 135–145)
Total Bilirubin: 0.3 mg/dL (ref 0.0–1.2)
Total Protein: 7.2 g/dL (ref 6.5–8.1)

## 2024-02-14 LAB — MAGNESIUM: Magnesium: 1.8 mg/dL (ref 1.7–2.4)

## 2024-02-14 MED ORDER — SODIUM CHLORIDE 0.9% FLUSH
10.0000 mL | Freq: Once | INTRAVENOUS | Status: AC
  Administered 2024-02-14: 10 mL

## 2024-02-14 MED ORDER — SODIUM CHLORIDE 0.9 % IV SOLN
Freq: Once | INTRAVENOUS | Status: AC
Start: 2024-02-14 — End: 2024-02-14

## 2024-02-14 MED ORDER — PALONOSETRON HCL INJECTION 0.25 MG/5ML
0.2500 mg | Freq: Once | INTRAVENOUS | Status: AC
Start: 2024-02-14 — End: 2024-02-14
  Administered 2024-02-14: 0.25 mg via INTRAVENOUS
  Filled 2024-02-14: qty 5

## 2024-02-14 MED ORDER — DEXAMETHASONE SODIUM PHOSPHATE 10 MG/ML IJ SOLN
10.0000 mg | Freq: Once | INTRAMUSCULAR | Status: AC
  Administered 2024-02-14: 10 mg via INTRAVENOUS
  Filled 2024-02-14: qty 1

## 2024-02-14 MED ORDER — DIPHENHYDRAMINE HCL 50 MG/ML IJ SOLN
50.0000 mg | Freq: Once | INTRAMUSCULAR | Status: AC
  Administered 2024-02-14: 50 mg via INTRAVENOUS
  Filled 2024-02-14: qty 1

## 2024-02-14 MED ORDER — SODIUM CHLORIDE 0.9% FLUSH: 10.0000 mL | INTRAVENOUS | Status: DC | PRN

## 2024-02-14 MED ORDER — SODIUM CHLORIDE 0.9 % IV SOLN
460.0000 mg | Freq: Once | INTRAVENOUS | Status: AC
  Administered 2024-02-14: 460 mg via INTRAVENOUS
  Filled 2024-02-14: qty 46

## 2024-02-14 MED ORDER — HEPARIN SOD (PORK) LOCK FLUSH 100 UNIT/ML IV SOLN: 500.0000 [IU] | Freq: Once | INTRAVENOUS | Status: DC | PRN

## 2024-02-14 MED ORDER — FAMOTIDINE IN NACL 20-0.9 MG/50ML-% IV SOLN
20.0000 mg | Freq: Once | INTRAVENOUS | Status: AC
  Administered 2024-02-14: 20 mg via INTRAVENOUS
  Filled 2024-02-14: qty 50

## 2024-02-14 MED ORDER — ACETAMINOPHEN 325 MG PO TABS
650.0000 mg | ORAL_TABLET | Freq: Once | ORAL | Status: AC
  Administered 2024-02-14: 650 mg via ORAL
  Filled 2024-02-14: qty 2

## 2024-02-14 MED ORDER — FOSAPREPITANT DIMEGLUMINE INJECTION 150 MG
150.0000 mg | Freq: Once | INTRAVENOUS | Status: AC
  Administered 2024-02-14: 150 mg via INTRAVENOUS
  Filled 2024-02-14: qty 150

## 2024-02-14 NOTE — Progress Notes (Signed)
 Trodelvy  dose basis changed to 5mg /kg for subsequent cycles per Dr. Lee Public.  Login Muckleroy, PharmD, MBA

## 2024-02-14 NOTE — Patient Instructions (Signed)
 [facility]  Discharge Instructions: Thank you for choosing [facility] to provide your oncology and hematology care.   If you have a lab appointment with the Cancer Center, please go directly to the Cancer Center and check in at the registration area.   Wear comfortable clothing and clothing appropriate for easy access to any Portacath or PICC line.   We strive to give you quality time with your provider. You may need to reschedule your appointment if you arrive late (15 or more minutes).  Arriving late affects you and other patients whose appointments are after yours.  Also, if you miss three or more appointments without notifying the office, you may be dismissed from the clinic at the provider's discretion.      For prescription refill requests, have your pharmacy contact our office and allow 72 hours for refills to be completed.    Today you received the following chemotherapy and/or immunotherapy agents: [name]      To help prevent nausea and vomiting after your treatment, we encourage you to take your nausea medication as directed.  BELOW ARE SYMPTOMS THAT SHOULD BE REPORTED IMMEDIATELY: *FEVER GREATER THAN 100.4 F (38 C) OR HIGHER *CHILLS OR SWEATING *NAUSEA AND VOMITING THAT IS NOT CONTROLLED WITH YOUR NAUSEA MEDICATION *UNUSUAL SHORTNESS OF BREATH *UNUSUAL BRUISING OR BLEEDING *URINARY PROBLEMS (pain or burning when urinating, or frequent urination) *BOWEL PROBLEMS (unusual diarrhea, constipation, pain near the anus) TENDERNESS IN MOUTH AND THROAT WITH OR WITHOUT PRESENCE OF ULCERS (sore throat, sores in mouth, or a toothache) UNUSUAL RASH, SWELLING OR PAIN  UNUSUAL VAGINAL DISCHARGE OR ITCHING   Items with * indicate a potential emergency and should be followed up as soon as possible or go to the Emergency Department if any problems should occur.  Please show the CHEMOTHERAPY ALERT CARD or IMMUNOTHERAPY  ALERT CARD at check-in to the Emergency Department and triage nurse.  Should you have questions after your visit or need to cancel or reschedule your appointment, please contact [facility]  Dept: [phone]  and follow the prompts.  Office hours are 8:00 a.m. to 4:30 p.m. Monday - Friday. Please note that voicemails left after 4:00 p.m. may not be returned until the following business day.  We are closed weekends and major holidays. You have access to a nurse at all times for urgent questions. Please call the main number to the clinic Dept: [phone] and follow the prompts.   For any non-urgent questions, you may also contact your provider using MyChart. We now offer e-Visits for anyone 66 and older to request care online for non-urgent symptoms. For details visit [email or web address].   Also download the MyChart app! Go to the app store, search "MyChart", open the app, select [place], and log in with your MyChart username and password.

## 2024-02-14 NOTE — Progress Notes (Signed)
 Patient declined 30 minutes post observation following Trodelvy . Pt tolerated tx well without incident. VSS at discharge. Ambulatory to lobby.

## 2024-02-16 ENCOUNTER — Other Ambulatory Visit: Payer: Self-pay | Admitting: Hematology and Oncology

## 2024-02-16 MED ORDER — IBUPROFEN 800 MG PO TABS: 800.0000 mg | ORAL_TABLET | Freq: Three times a day (TID) | ORAL | 0 refills | Status: DC | PRN

## 2024-02-21 ENCOUNTER — Other Ambulatory Visit: Payer: Self-pay | Admitting: General Surgery

## 2024-02-25 ENCOUNTER — Inpatient Hospital Stay: Attending: Hematology and Oncology

## 2024-02-25 VITALS — BP 147/87 | HR 100 | Temp 97.6°F | Resp 17

## 2024-02-25 DIAGNOSIS — Z5189 Encounter for other specified aftercare: Secondary | ICD-10-CM | POA: Insufficient documentation

## 2024-02-25 DIAGNOSIS — D696 Thrombocytopenia, unspecified: Secondary | ICD-10-CM | POA: Insufficient documentation

## 2024-02-25 DIAGNOSIS — Z5112 Encounter for antineoplastic immunotherapy: Secondary | ICD-10-CM | POA: Insufficient documentation

## 2024-02-25 DIAGNOSIS — Z79899 Other long term (current) drug therapy: Secondary | ICD-10-CM | POA: Insufficient documentation

## 2024-02-25 DIAGNOSIS — C50919 Malignant neoplasm of unspecified site of unspecified female breast: Secondary | ICD-10-CM

## 2024-02-25 DIAGNOSIS — Z9221 Personal history of antineoplastic chemotherapy: Secondary | ICD-10-CM | POA: Diagnosis not present

## 2024-02-25 DIAGNOSIS — N6331 Unspecified lump in axillary tail of the right breast: Secondary | ICD-10-CM

## 2024-02-25 DIAGNOSIS — D649 Anemia, unspecified: Secondary | ICD-10-CM | POA: Insufficient documentation

## 2024-02-25 DIAGNOSIS — C771 Secondary and unspecified malignant neoplasm of intrathoracic lymph nodes: Secondary | ICD-10-CM | POA: Insufficient documentation

## 2024-02-25 DIAGNOSIS — C50012 Malignant neoplasm of nipple and areola, left female breast: Secondary | ICD-10-CM | POA: Diagnosis present

## 2024-02-25 MED ORDER — FILGRASTIM-SNDZ 480 MCG/0.8ML IJ SOSY
480.0000 ug | PREFILLED_SYRINGE | Freq: Once | INTRAMUSCULAR | Status: AC
Start: 2024-02-25 — End: 2024-02-25
  Administered 2024-02-25: 480 ug via SUBCUTANEOUS

## 2024-02-26 ENCOUNTER — Ambulatory Visit

## 2024-02-27 MED FILL — Fosaprepitant Dimeglumine For IV Infusion 150 MG (Base Eq): INTRAVENOUS | Qty: 5 | Status: AC

## 2024-02-28 ENCOUNTER — Inpatient Hospital Stay (HOSPITAL_BASED_OUTPATIENT_CLINIC_OR_DEPARTMENT_OTHER): Admitting: Hematology and Oncology

## 2024-02-28 ENCOUNTER — Inpatient Hospital Stay

## 2024-02-28 ENCOUNTER — Encounter: Payer: Self-pay | Admitting: Hematology and Oncology

## 2024-02-28 VITALS — BP 130/88 | HR 122 | Temp 97.8°F | Resp 20 | Ht 66.0 in | Wt 207.6 lb

## 2024-02-28 VITALS — BP 117/78 | HR 113 | Temp 98.8°F | Resp 16

## 2024-02-28 DIAGNOSIS — C50012 Malignant neoplasm of nipple and areola, left female breast: Secondary | ICD-10-CM | POA: Diagnosis not present

## 2024-02-28 DIAGNOSIS — Z95828 Presence of other vascular implants and grafts: Secondary | ICD-10-CM

## 2024-02-28 DIAGNOSIS — C50919 Malignant neoplasm of unspecified site of unspecified female breast: Secondary | ICD-10-CM

## 2024-02-28 DIAGNOSIS — N6331 Unspecified lump in axillary tail of the right breast: Secondary | ICD-10-CM

## 2024-02-28 DIAGNOSIS — C771 Secondary and unspecified malignant neoplasm of intrathoracic lymph nodes: Secondary | ICD-10-CM | POA: Diagnosis not present

## 2024-02-28 LAB — CBC WITH DIFFERENTIAL (CANCER CENTER ONLY)
Abs Immature Granulocytes: 0.06 10*3/uL (ref 0.00–0.07)
Basophils Absolute: 0 10*3/uL (ref 0.0–0.1)
Basophils Relative: 1 %
Eosinophils Absolute: 0 10*3/uL (ref 0.0–0.5)
Eosinophils Relative: 1 %
HCT: 31.4 % — ABNORMAL LOW (ref 36.0–46.0)
Hemoglobin: 10.6 g/dL — ABNORMAL LOW (ref 12.0–15.0)
Immature Granulocytes: 2 %
Lymphocytes Relative: 11 %
Lymphs Abs: 0.4 10*3/uL — ABNORMAL LOW (ref 0.7–4.0)
MCH: 29.5 pg (ref 26.0–34.0)
MCHC: 33.8 g/dL (ref 30.0–36.0)
MCV: 87.5 fL (ref 80.0–100.0)
Monocytes Absolute: 0.3 10*3/uL (ref 0.1–1.0)
Monocytes Relative: 8 %
Neutro Abs: 2.7 10*3/uL (ref 1.7–7.7)
Neutrophils Relative %: 77 %
Platelet Count: 93 10*3/uL — ABNORMAL LOW (ref 150–400)
RBC: 3.59 MIL/uL — ABNORMAL LOW (ref 3.87–5.11)
RDW: 16.7 % — ABNORMAL HIGH (ref 11.5–15.5)
WBC Count: 3.5 10*3/uL — ABNORMAL LOW (ref 4.0–10.5)
nRBC: 0 % (ref 0.0–0.2)

## 2024-02-28 LAB — CMP (CANCER CENTER ONLY)
ALT: 10 U/L (ref 0–44)
AST: 14 U/L — ABNORMAL LOW (ref 15–41)
Albumin: 4.2 g/dL (ref 3.5–5.0)
Alkaline Phosphatase: 98 U/L (ref 38–126)
Anion gap: 8 (ref 5–15)
BUN: 14 mg/dL (ref 6–20)
CO2: 27 mmol/L (ref 22–32)
Calcium: 9.1 mg/dL (ref 8.9–10.3)
Chloride: 103 mmol/L (ref 98–111)
Creatinine: 0.91 mg/dL (ref 0.44–1.00)
GFR, Estimated: >60 mL/min (ref >60)
Glucose, Bld: 114 mg/dL — ABNORMAL HIGH (ref 70–99)
Potassium: 3.3 mmol/L — ABNORMAL LOW (ref 3.5–5.1)
Sodium: 138 mmol/L (ref 135–145)
Total Bilirubin: 0.4 mg/dL (ref 0.0–1.2)
Total Protein: 7.3 g/dL (ref 6.5–8.1)

## 2024-02-28 LAB — PHOSPHORUS: Phosphorus: 3.3 mg/dL (ref 2.5–4.6)

## 2024-02-28 LAB — MAGNESIUM: Magnesium: 1.8 mg/dL (ref 1.7–2.4)

## 2024-02-28 LAB — DERMATOLOGY PATHOLOGY

## 2024-02-28 MED ORDER — FOSAPREPITANT DIMEGLUMINE INJECTION 150 MG
150.0000 mg | Freq: Once | INTRAVENOUS | Status: AC
  Administered 2024-02-28: 150 mg via INTRAVENOUS
  Filled 2024-02-28: qty 150

## 2024-02-28 MED ORDER — HEPARIN SOD (PORK) LOCK FLUSH 100 UNIT/ML IV SOLN
500.0000 [IU] | Freq: Once | INTRAVENOUS | Status: AC | PRN
  Administered 2024-02-28: 500 [IU]

## 2024-02-28 MED ORDER — SODIUM CHLORIDE 0.9 % IV SOLN
460.0000 mg | Freq: Once | INTRAVENOUS | Status: AC
  Administered 2024-02-28: 460 mg via INTRAVENOUS
  Filled 2024-02-28: qty 46

## 2024-02-28 MED ORDER — PALONOSETRON HCL INJECTION 0.25 MG/5ML
0.2500 mg | Freq: Once | INTRAVENOUS | Status: AC
  Administered 2024-02-28: 0.25 mg via INTRAVENOUS
  Filled 2024-02-28: qty 5

## 2024-02-28 MED ORDER — FAMOTIDINE IN NACL 20-0.9 MG/50ML-% IV SOLN
20.0000 mg | Freq: Once | INTRAVENOUS | Status: AC
  Administered 2024-02-28: 20 mg via INTRAVENOUS
  Filled 2024-02-28: qty 50

## 2024-02-28 MED ORDER — SODIUM CHLORIDE 0.9% FLUSH
10.0000 mL | Freq: Once | INTRAVENOUS | Status: AC
  Administered 2024-02-28: 10 mL

## 2024-02-28 MED ORDER — SODIUM CHLORIDE 0.9% FLUSH
10.0000 mL | INTRAVENOUS | Status: DC | PRN
  Administered 2024-02-28: 10 mL

## 2024-02-28 MED ORDER — SODIUM CHLORIDE 0.9 % IV SOLN
Freq: Once | INTRAVENOUS | Status: AC
Start: 2024-02-28 — End: 2024-02-28

## 2024-02-28 MED ORDER — ATROPINE SULFATE 1 MG/ML IV SOLN: 0.5000 mg | Freq: Once | INTRAVENOUS | Status: DC | PRN

## 2024-02-28 MED ORDER — ACETAMINOPHEN 325 MG PO TABS
650.0000 mg | ORAL_TABLET | Freq: Once | ORAL | Status: AC
  Administered 2024-02-28: 650 mg via ORAL
  Filled 2024-02-28: qty 2

## 2024-02-28 MED ORDER — DIPHENHYDRAMINE HCL 50 MG/ML IJ SOLN
50.0000 mg | Freq: Once | INTRAMUSCULAR | Status: AC
  Administered 2024-02-28: 50 mg via INTRAVENOUS
  Filled 2024-02-28: qty 1

## 2024-02-28 MED ORDER — DEXAMETHASONE SODIUM PHOSPHATE 10 MG/ML IJ SOLN
10.0000 mg | Freq: Once | INTRAMUSCULAR | Status: AC
  Administered 2024-02-28: 10 mg via INTRAVENOUS
  Filled 2024-02-28: qty 1

## 2024-02-28 NOTE — Progress Notes (Signed)
 Per Dr. Gudena okay to proceed with treatment with pending phosphorus level result and patient's heart rate of 117.

## 2024-02-28 NOTE — Progress Notes (Signed)
 Patient Care Team: Serita Danes, MD as PCP - General (Family Medicine) Cameron Cea, MD as Consulting Physician (Hematology and Oncology) Pickenpack-Cousar, Giles Labrum, NP as Nurse Practitioner [facility] and Palliative Medicine)  DIAGNOSIS:  Encounter Diagnosis  Name Primary?   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)  06/30/2011 Initial Diagnosis   Breast cancer, IDC, Left, Stage III, Triple negative, 7.6 cm breast mass and palpable axillary mass   09/06/2011 Miscellaneous   BRCA 1 and 2: Negative   09/14/2011 - 11/23/2011 Neo-Adjuvant Chemotherapy   Dose dense FEC followed by dose dense Taxotere    12/21/2011 Surgery   Left lumpectomy: High-grade poorly differentiated IDC 1.7 cm with high-grade DCIS, margins negative, 3/7 lymph nodes positive, ER 0%, PR 0%, HER-2 negative ratio 1.33 T1CN1 stage IIb   12/31/2011 - 02/15/2012 Radiation Therapy   Radiation at [facility]   09/26/2017 Relapse/Recurrence   Left breast upper outer quadrant within the lumpectomy bed: Fibrosis no malignancy, right axillary lymph node biopsy metastatic high-grade carcinoma ER 0%, PR 0%, Ki-67 90%, HER-2 negative ratio 1.11 (similar to previous ductal carcinoma)   11/14/2017 - 03/06/2018 Neo-Adjuvant Chemotherapy   Neo-Adjuvant chemotherapy with Gemzar  and carboplatin  days 1 and 8 every 3 weeks   01/10/2018 Genetic Testing   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  The Hereditary Gene Panel offered by [facility] includes sequencing and/or deletion duplication testing of the following 47 genes: APC, ATM, AXIN2, BARD1, BMPR1A, BRCA1, BRCA2, BRIP1, CDH1, CDK4, CDKN2A (p14ARF), CDKN2A (p16INK4a), CHEK2, CTNNA1, DICER1, EPCAM (Deletion/duplication testing only), GREM1 (promoter region deletion/duplication testing  only), KIT, MEN1, MLH1, MSH2, MSH3, MSH6, MUTYH, NBN, NF1, NHTL1, PALB2, PDGFRA, PMS2, POLD1, POLE, PTEN, RAD50, RAD51C, RAD51D, SDHB, SDHC, SDHD, SMAD4, SMARCA4. STK11, TP53, TSC1, TSC2, and VHL.  The following genes were evaluated for sequence changes only: SDHA and HOXB13 c.251G>A variant only. The report date is January 10, 2018.    05/31/2023 Procedure   Mediastinal mass biopsy: Metastatic poorly differentiated carcinoma compatible with breast primary ER +60%, PR 0%, HER2 0, Ki-67 40%    06/02/2023 PET scan   PET/CT Anterior mediastinal mass is hypermetabolic and centrally necrotic, soft tissue lesions anterior to the heart, 10 mm pleural-based lesion, level 2 cervical nodes, hypermetabolism hepatic dome mottled FDG accumulation throughout the skeletal system    06/06/2023 - 07/14/2023 Anti-estrogen oral therapy   Antiestrogen therapy with Verzenio  and letrozole     07/07/2023 Relapse/Recurrence   Increasing mediastinal mass 11.6 cm (used to be 10.8 cm) effacing the heart and involving the sternum, additional mediastinal/pericardial lymph nodes increased right extra lymph node 1.5 cm, nodules 0.2 cm (used to be 0.8 cm), few new scattered lung nodules)   07/25/2023 -  Chemotherapy   Patient is on Treatment Plan : BREAST METASTATIC Sacituzumab govitecan -hziy (Trodelvy ) D1,8 q21d     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)  04/06/2018 Surgery   Right axillary lymph node dissection: 1/11 node positive for metastatic high-grade carcinoma   04/25/2018 Initial Diagnosis   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)   04/25/2018 Cancer Staging   Staging form: Breast, AJCC 8th Edition - Clinical: Stage IIB (cT0, cN1, cM0, G3, ER-, PR-, HER2-) - Signed by Colie Dawes, MD on 04/25/2018   05/16/2018 - 06/22/2018 Radiation Therapy   Adjuvant radiation therapy with Xeloda    07/11/2018 -  10/24/2018 Chemotherapy   Adjuvant chemotherapy with CMF     CHIEF COMPLIANT: Follow-up after recent  skin biopsy and to receive Sacituzumab treatment  HISTORY OF PRESENT ILLNESS:  History of Present Illness Jessica Simpson is a 53 year old female with metastatic breast cancer who presents for follow-up on biopsy results and ongoing treatment.  Skin lesions remain unchanged and continue to itch, with some relief from Benadryl . Biopsy results are pending from a week ago.  She is undergoing treatment with Trodelvy  for metastatic breast cancer. Diarrhea and vomiting have decreased, with diarrhea triggered by certain foods and vomiting occurring about once a week or less. Hemoglobin has slightly decreased, and platelets are just below 100,000.  Breathing has improved, but there is increased coughing in the past few days, possibly due to pollen exposure. She attempted to walk outside without a mask.  Current medications include Trodelvy , with the next treatment scheduled for next Tuesday and an injection planned for Saturday.     ALLERGIES:  is allergic to codeine and hydrocodone-acetaminophen .  MEDICATIONS:  Current Outpatient Medications  Medication Sig Dispense Refill   albuterol  (ACCUNEB ) 0.63 MG/3ML nebulizer solution Take 3 mLs (0.63 mg total) by nebulization every 6 (six) hours as needed for wheezing. 75 mL 12   amLODipine  (NORVASC ) 5 MG tablet Take 10 mg by mouth daily.  6   Cyanocobalamin  (VITAMIN B-12) 5000 MCG TBDP Take 5,000 mcg by mouth daily.     diphenoxylate -atropine  (LOMOTIL ) 2.5-0.025 MG tablet Take 1-2 tablets by mouth 4 (four) times daily as needed for diarrhea or loose stools. 60 tablet 0   ibuprofen  (ADVIL ) 800 MG tablet Take 1 tablet (800 mg total) by mouth every 8 (eight) hours as needed. 30 tablet 0   lidocaine -prilocaine  (EMLA ) cream Apply to affected area once 30 g 3   LORazepam  (ATIVAN ) 1 MG tablet Take 1 tablet (1 mg total) by mouth every 8 (eight) hours. 10 tablet 0   magnesium  oxide (MAG-OX) 400 MG tablet TAKE 1 TABLET BY MOUTH EVERY DAY 30 tablet 1    metoprolol  succinate (TOPROL  XL) 25 MG 24 hr tablet Take 1 tablet (25 mg total) by mouth daily. 90 tablet 3   Omega-3 Fatty Acids (FISH OIL MAXIMUM STRENGTH) 1200 MG CPDR Take 1,200 mg by mouth daily.     Vitamin D , Ergocalciferol , (DRISDOL ) 1.25 MG (50000 UNIT) CAPS capsule Take 1 capsule (50,000 Units total) by mouth once a week. 12 capsule 2   acetaminophen  (TYLENOL ) 500 MG tablet Take 500 mg by mouth every 6 (six) hours as needed. For pain (Patient not taking: Reported on 02/07/2024)     loratadine  (CLARITIN ) 10 MG tablet Take 1 tablet (10 mg total) by mouth daily as needed for allergies. (Patient not taking: Reported on 02/28/2024) 90 tablet 2   ondansetron  (ZOFRAN ) 8 MG tablet Take 1 tablet (8 mg total) by mouth 2 (two) times daily. (Patient not taking: Reported on 02/28/2024) 60 tablet 6   prochlorperazine  (COMPAZINE ) 10 MG tablet Take 1 tablet (10 mg total) by mouth every 6 (six) hours as needed for nausea or vomiting. (Patient not taking: Reported on 02/28/2024) 30 tablet 6   No current facility-administered medications for this visit.    PHYSICAL EXAMINATION: ECOG PERFORMANCE STATUS: 1 - Symptomatic but completely ambulatory  Vitals:   02/28/24 0831  BP: 130/88  Pulse: (!) 122  Resp: 20  Temp: 97.8 F (36.6 C)  SpO2: 100%   Filed Weights   02/28/24 0831  Weight: 207 lb  9.6 oz (94.2 kg)    Physical Exam Small skin nodules adjacent to the port  (exam performed in the presence of a chaperone)  LABORATORY DATA:  I have reviewed the data as listed    Latest Ref Rng & Units 02/14/2024    9:52 AM 02/07/2024   10:00 AM 01/23/2024    8:45 AM  CMP  Glucose 70 - 99 mg/dL 130  865  94   BUN 6 - 20 mg/dL 14  19  16    Creatinine 0.44 - 1.00 mg/dL 7.84  6.96  2.95   Sodium 135 - 145 mmol/L 139  139  140   Potassium 3.5 - 5.1 mmol/L 3.4  3.6  3.6   Chloride 98 - 111 mmol/L 103  105  102   CO2 22 - 32 mmol/L 28  27  30    Calcium 8.9 - 10.3 mg/dL 9.3  9.6  9.3   Total Protein 6.5 - 8.1  g/dL 7.2  7.3  7.1   Total Bilirubin 0.0 - 1.2 mg/dL 0.3  0.4  0.3   Alkaline Phos 38 - 126 U/L 106  98  97   AST 15 - 41 U/L 13  13  12    ALT 0 - 44 U/L 10  9  11      Lab Results  Component Value Date   WBC 3.5 (L) 02/28/2024   HGB 10.6 (L) 02/28/2024   HCT 31.4 (L) 02/28/2024   MCV 87.5 02/28/2024   PLT 93 (L) 02/28/2024   NEUTROABS 2.7 02/28/2024    ASSESSMENT & PLAN:  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  Scattered pulmonary nodules not visualized.  Decreased hepatic metastases.  Stable T1    Treatment plan: Trodelvy  started 07/25/2023, today is cycle 10     Trodelvy  toxicities: Fatigue: multifactorial related to chemo and pacemaker  Hypomagnesemia: Oral magnesium  supplementation  Diarrhea.  Neutropenia: Patient gets Granix  injections prior to each treatment.  Today's ANC is 1000.  I reduced the dosage of today's chemo and she is okay to proceed.  I added a Granix  injection prior to every single treatment.   PET/CT 11/29/2023: Progression of nodal metastases in the cervical region and supraclavicular region.  Hypermetabolic right axillary lymph node and soft tissue metastases in the subcutaneous fat of the right upper back.  Regression of the anterior mediastinal mass with near complete regression of metabolic activity.  Activity of the porta hepatis.    Radiation pneumonitis: I sent a prescription for albuterol  nebulizer and a  nebulizer machine.   Liver MRI 01/18/2024: Upper abdominal lymphadenopathy (1.6 cm lymph node at the porta hepatis and 1.3 cm portacaval lymph node corresponding to PET findings) CT chest 02/03/2024: Prevascular mass stable to minimally decreased, lytic destruction of mid sternum as before, left thoracic inlet and upper abdominal lymph nodes as before. CT neck 02/04/2024: Mildly enlarged bilateral cervical and left supraclavicular lymph nodes   Cutaneous lesions on the chest: skin punch biopsy: 02/21/2024: Results pending I have  adjusted the treatment plan to treat her as long as her platelets are greater than 75.  ------------------------------------- Assessment and Plan Assessment & Plan Metastatic breast cancer Under treatment with Trodelvy . Blood counts slightly decreased but stable. No major new or worsening side effects. Diarrhea and vomiting episodes reduced, diarrhea linked to specific foods. - Continue Trodelvy  at current dose. - Schedule next treatment on Tuesday. - Add appointment for follow-up on treatment day. - Monitor blood counts and adjust dose if necessary.  Skin lesions Biopsy performed, awaiting pathology results. Differential includes allergic reaction or inflammation. Benadryl  used for pruritus. - Follow up on biopsy results. - Continue Benadryl  for pruritus.      No orders of the defined types were placed in this encounter.  The patient has a good understanding of the overall plan. she agrees with it. she will call with any problems that may develop before the next visit here. Total time spent: 30 mins including face to face time and time spent for planning, charting and co-ordination of care   Margert Sheerer, MD 02/28/24

## 2024-02-28 NOTE — Assessment & Plan Note (Signed)
 Prior treatment: Verzinio with letrozole  started 06/08/2023-07/20/2023   CT CAP: Increasing mediastinal mass 11.6 cm (used to be 10.8 cm) effacing the heart and involving the sternum, additional mediastinal/pericardial lymph nodes increased right extra lymph node 1.5 cm, nodules 0.2 cm (used to be 0.8 cm), few new scattered lung nodules)  CT CAP 09/20/2023: Decrease in the size of the anterior mediastinal mass that effaces the heart and invades the sternum/chest wall, decreased size of the pleural-based left lower lobe lung nodule.  Scattered pulmonary nodules not visualized.  Decreased hepatic metastases.  Stable T1    Treatment plan: Trodelvy  started 07/25/2023, today is cycle 10     Trodelvy  toxicities: Fatigue: multifactorial related to chemo and pacemaker  Hypomagnesemia: Oral magnesium  supplementation  Diarrhea.  Neutropenia: Patient gets Granix  injections prior to each treatment.  Today's ANC is 1000.  I reduced the dosage of today's chemo and she is okay to proceed.  I added a Granix  injection prior to every single treatment.   PET/CT 11/29/2023: Progression of nodal metastases in the cervical region and supraclavicular region.  Hypermetabolic right axillary lymph node and soft tissue metastases in the subcutaneous fat of the right upper back.  Regression of the anterior mediastinal mass with near complete regression of metabolic activity.  Activity of the porta hepatis.    Radiation pneumonitis: I sent a prescription for albuterol  nebulizer and a nebulizer machine.   Liver MRI 01/18/2024: Upper abdominal lymphadenopathy (1.6 cm lymph node at the porta hepatis and 1.3 cm portacaval lymph node corresponding to PET findings) CT chest 02/03/2024: Prevascular mass stable to minimally decreased, lytic destruction of mid sternum as before, left thoracic inlet and upper abdominal lymph nodes as before. CT neck 02/04/2024: Mildly enlarged bilateral cervical and left supraclavicular lymph nodes    Cutaneous lesions on the chest: skin punch biopsy: 02/21/2024: Results pending  We will assess her in 3 weeks to see if we need to switch treatment or we can continue with the same.  Based on radiology findings she appears to have stable disease.

## 2024-02-28 NOTE — Progress Notes (Addendum)
 Patient declined observation after Trodelvy  infusion. Vitals stable, patient denies any distress, discharged and ambulated to the lobby.

## 2024-02-28 NOTE — Addendum Note (Signed)
 Addended by: Ziair Penson K on: 02/28/2024 01:19 PM   Modules accepted: Orders

## 2024-02-29 ENCOUNTER — Telehealth: Payer: Self-pay | Admitting: Hematology and Oncology

## 2024-02-29 NOTE — Telephone Encounter (Signed)
 Jessica Simpson

## 2024-03-01 ENCOUNTER — Encounter: Payer: Self-pay | Admitting: Hematology and Oncology

## 2024-03-01 NOTE — Telephone Encounter (Signed)
 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 It indicates that Trodelvy  is no longer working. I will discuss with her different options when we meet her next week. Carboplatin  gemcitabine  are Abraxane or eribulin would be some treatment options that we can consider.

## 2024-03-03 ENCOUNTER — Inpatient Hospital Stay

## 2024-03-03 VITALS — BP 103/71 | HR 114 | Temp 97.0°F | Resp 16

## 2024-03-03 DIAGNOSIS — C50919 Malignant neoplasm of unspecified site of unspecified female breast: Secondary | ICD-10-CM

## 2024-03-03 DIAGNOSIS — C50012 Malignant neoplasm of nipple and areola, left female breast: Secondary | ICD-10-CM | POA: Diagnosis not present

## 2024-03-03 DIAGNOSIS — N6331 Unspecified lump in axillary tail of the right breast: Secondary | ICD-10-CM

## 2024-03-03 MED ORDER — FILGRASTIM-SNDZ 480 MCG/0.8ML IJ SOSY
480.0000 ug | PREFILLED_SYRINGE | Freq: Once | INTRAMUSCULAR | Status: AC
  Administered 2024-03-03: 480 ug via SUBCUTANEOUS

## 2024-03-05 ENCOUNTER — Inpatient Hospital Stay

## 2024-03-05 ENCOUNTER — Other Ambulatory Visit: Payer: Self-pay

## 2024-03-05 ENCOUNTER — Inpatient Hospital Stay (HOSPITAL_BASED_OUTPATIENT_CLINIC_OR_DEPARTMENT_OTHER): Admitting: Hematology and Oncology

## 2024-03-05 VITALS — BP 142/88 | HR 106 | Temp 98.0°F | Resp 18 | Ht 66.0 in | Wt 206.0 lb

## 2024-03-05 DIAGNOSIS — C50611 Malignant neoplasm of axillary tail of right female breast: Secondary | ICD-10-CM

## 2024-03-05 DIAGNOSIS — C50919 Malignant neoplasm of unspecified site of unspecified female breast: Secondary | ICD-10-CM

## 2024-03-05 DIAGNOSIS — C771 Secondary and unspecified malignant neoplasm of intrathoracic lymph nodes: Secondary | ICD-10-CM

## 2024-03-05 DIAGNOSIS — N6331 Unspecified lump in axillary tail of the right breast: Secondary | ICD-10-CM

## 2024-03-05 DIAGNOSIS — C50012 Malignant neoplasm of nipple and areola, left female breast: Secondary | ICD-10-CM | POA: Diagnosis not present

## 2024-03-05 DIAGNOSIS — Z95828 Presence of other vascular implants and grafts: Secondary | ICD-10-CM

## 2024-03-05 LAB — CBC WITH DIFFERENTIAL (CANCER CENTER ONLY)
Abs Immature Granulocytes: 0.27 10*3/uL — ABNORMAL HIGH (ref 0.00–0.07)
Basophils Absolute: 0 10*3/uL (ref 0.0–0.1)
Basophils Relative: 1 %
Eosinophils Absolute: 0 10*3/uL (ref 0.0–0.5)
Eosinophils Relative: 0 %
HCT: 30.7 % — ABNORMAL LOW (ref 36.0–46.0)
Hemoglobin: 10.2 g/dL — ABNORMAL LOW (ref 12.0–15.0)
Immature Granulocytes: 4 %
Lymphocytes Relative: 6 %
Lymphs Abs: 0.3 10*3/uL — ABNORMAL LOW (ref 0.7–4.0)
MCH: 29.7 pg (ref 26.0–34.0)
MCHC: 33.2 g/dL (ref 30.0–36.0)
MCV: 89.5 fL (ref 80.0–100.0)
Monocytes Absolute: 0.3 10*3/uL (ref 0.1–1.0)
Monocytes Relative: 5 %
Neutro Abs: 5.2 10*3/uL (ref 1.7–7.7)
Neutrophils Relative %: 84 %
Platelet Count: 88 10*3/uL — ABNORMAL LOW (ref 150–400)
RBC: 3.43 MIL/uL — ABNORMAL LOW (ref 3.87–5.11)
RDW: 17.7 % — ABNORMAL HIGH (ref 11.5–15.5)
WBC Count: 6.2 10*3/uL (ref 4.0–10.5)
nRBC: 0 % (ref 0.0–0.2)

## 2024-03-05 LAB — CMP (CANCER CENTER ONLY)
ALT: 7 U/L (ref 0–44)
AST: 12 U/L — ABNORMAL LOW (ref 15–41)
Albumin: 4.1 g/dL (ref 3.5–5.0)
Alkaline Phosphatase: 101 U/L (ref 38–126)
Anion gap: 7 (ref 5–15)
BUN: 17 mg/dL (ref 6–20)
CO2: 28 mmol/L (ref 22–32)
Calcium: 9.2 mg/dL (ref 8.9–10.3)
Chloride: 103 mmol/L (ref 98–111)
Creatinine: 0.9 mg/dL (ref 0.44–1.00)
GFR, Estimated: >60 mL/min (ref >60)
Glucose, Bld: 93 mg/dL (ref 70–99)
Potassium: 3.4 mmol/L — ABNORMAL LOW (ref 3.5–5.1)
Sodium: 138 mmol/L (ref 135–145)
Total Bilirubin: 0.4 mg/dL (ref 0.0–1.2)
Total Protein: 7.1 g/dL (ref 6.5–8.1)

## 2024-03-05 LAB — PHOSPHORUS: Phosphorus: 3.6 mg/dL (ref 2.5–4.6)

## 2024-03-05 LAB — MAGNESIUM: Magnesium: 1.9 mg/dL (ref 1.7–2.4)

## 2024-03-05 MED ORDER — SODIUM CHLORIDE 0.9% FLUSH
10.0000 mL | Freq: Once | INTRAVENOUS | Status: AC
  Administered 2024-03-05: 10 mL

## 2024-03-05 MED ORDER — HEPARIN SOD (PORK) LOCK FLUSH 100 UNIT/ML IV SOLN
500.0000 [IU] | Freq: Once | INTRAVENOUS | Status: AC
  Administered 2024-03-05: 500 [IU]

## 2024-03-05 MED FILL — Fosaprepitant Dimeglumine For IV Infusion 150 MG (Base Eq): INTRAVENOUS | Qty: 5 | Status: AC

## 2024-03-05 NOTE — Progress Notes (Signed)
 Caris requisition and all supporting documents faxed per MD. Confirmation received.

## 2024-03-05 NOTE — Assessment & Plan Note (Signed)
 06/30/2011: Left breast cancer stage III triple negative, 7.6 cm 09/14/2011: Dose dense FEC followed by dose dense Taxotere  neoadjuvant chemo 12/21/2011: Left lumpectomy: High-grade poorly differentiated IDC 1.7 cm 3/7 nodes positive triple negative, XRT 09/26/2017: Relapse: Right axillary lymph node biopsy positive for metastatic high-grade carcinoma triple negative 11/14/2017: Neoadjuvant Gemzar  and carboplatin  05/31/2023: Relapse: Mediastinal mass biopsy: Metastatic poorly differentiated carcinoma ER 60%, HER2 negative 06/06/2023: Verzinio with letrozole  07/25/2023: Trodelvy  02/21/2024: Chest wall skin biopsy: Poorly differentiated carcinoma ER 0% PR 0% Ki-67 40%, HER2 0  Treatment plan: Recommended switching treatment from Trodelvy  to eribulin

## 2024-03-05 NOTE — Progress Notes (Signed)
 Urgent referral to [facility] scanned and emailed to [email or web address].

## 2024-03-05 NOTE — Progress Notes (Signed)
 Patient Care Team: Serita Danes, MD as PCP - General (Family Medicine) Cameron Cea, MD as Consulting Physician (Hematology and Oncology) Pickenpack-Cousar, Giles Labrum, NP as Nurse Practitioner [facility] and Palliative Medicine)  DIAGNOSIS:  Encounter Diagnosis  Name Primary?   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)  06/30/2011 Initial Diagnosis   Breast cancer, IDC, Left, Stage III, Triple negative, 7.6 cm breast mass and palpable axillary mass   09/06/2011 Miscellaneous   BRCA 1 and 2: Negative   09/14/2011 - 11/23/2011 Neo-Adjuvant Chemotherapy   Dose dense FEC followed by dose dense Taxotere    12/21/2011 Surgery   Left lumpectomy: High-grade poorly differentiated IDC 1.7 cm with high-grade DCIS, margins negative, 3/7 lymph nodes positive, ER 0%, PR 0%, HER-2 negative ratio 1.33 T1CN1 stage IIb   12/31/2011 - 02/15/2012 Radiation Therapy   Radiation at [facility]   09/26/2017 Relapse/Recurrence   Left breast upper outer quadrant within the lumpectomy bed: Fibrosis no malignancy, right axillary lymph node biopsy metastatic high-grade carcinoma ER 0%, PR 0%, Ki-67 90%, HER-2 negative ratio 1.11 (similar to previous ductal carcinoma)   11/14/2017 - 03/06/2018 Neo-Adjuvant Chemotherapy   Neo-Adjuvant chemotherapy with Gemzar  and carboplatin  days 1 and 8 every 3 weeks   01/10/2018 Genetic Testing   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  The Hereditary Gene Panel offered by [facility] includes sequencing and/or deletion duplication testing of the following 47 genes: APC, ATM, AXIN2, BARD1, BMPR1A, BRCA1, BRCA2, BRIP1, CDH1, CDK4, CDKN2A (p14ARF), CDKN2A (p16INK4a), CHEK2, CTNNA1, DICER1, EPCAM (Deletion/duplication testing only), GREM1 (promoter region deletion/duplication testing  only), KIT, MEN1, MLH1, MSH2, MSH3, MSH6, MUTYH, NBN, NF1, NHTL1, PALB2, PDGFRA, PMS2, POLD1, POLE, PTEN, RAD50, RAD51C, RAD51D, SDHB, SDHC, SDHD, SMAD4, SMARCA4. STK11, TP53, TSC1, TSC2, and VHL.  The following genes were evaluated for sequence changes only: SDHA and HOXB13 c.251G>A variant only. The report date is January 10, 2018.    05/31/2023 Procedure   Mediastinal mass biopsy: Metastatic poorly differentiated carcinoma compatible with breast primary ER +60%, PR 0%, HER2 0, Ki-67 40%    06/02/2023 PET scan   PET/CT Anterior mediastinal mass is hypermetabolic and centrally necrotic, soft tissue lesions anterior to the heart, 10 mm pleural-based lesion, level 2 cervical nodes, hypermetabolism hepatic dome mottled FDG accumulation throughout the skeletal system    06/06/2023 - 07/14/2023 Anti-estrogen oral therapy   Antiestrogen therapy with Verzenio  and letrozole     07/07/2023 Relapse/Recurrence   Increasing mediastinal mass 11.6 cm (used to be 10.8 cm) effacing the heart and involving the sternum, additional mediastinal/pericardial lymph nodes increased right extra lymph node 1.5 cm, nodules 0.2 cm (used to be 0.8 cm), few new scattered lung nodules)   07/25/2023 -  Chemotherapy   Patient is on Treatment Plan : BREAST METASTATIC Sacituzumab govitecan -hziy (Trodelvy ) D1,8 q21d     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)  04/06/2018 Surgery   Right axillary lymph node dissection: 1/11 node positive for metastatic high-grade carcinoma   04/25/2018 Initial Diagnosis   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)   04/25/2018 Cancer Staging   Staging form: Breast, AJCC 8th Edition - Clinical: Stage IIB (cT0, cN1, cM0, G3, ER-, PR-, HER2-) - Signed by Colie Dawes, MD on 04/25/2018   05/16/2018 - 06/22/2018 Radiation Therapy   Adjuvant radiation therapy with Xeloda    07/11/2018 -  10/24/2018 Chemotherapy   Adjuvant chemotherapy with CMF     CHIEF COMPLIANT: Follow-up to discuss  the treatment plan  HISTORY OF PRESENT ILLNESS: Jessica Simpson is 53 year old with above-mentioned history of triple negative breast cancer who underwent recent skin biopsy for subcutaneous nodules around the port and pathology came back as metastatic triple negative breast cancer.  She is here to discuss the pathology report and treatment plan.  She is extremely anxious and concerned. She reports no new symptoms in terms of pain or discomfort.  ALLERGIES:  is allergic to codeine and hydrocodone-acetaminophen .  MEDICATIONS:  Current Outpatient Medications  Medication Sig Dispense Refill   albuterol  (ACCUNEB ) 0.63 MG/3ML nebulizer solution Take 3 mLs (0.63 mg total) by nebulization every 6 (six) hours as needed for wheezing. 75 mL 12   amLODipine  (NORVASC ) 5 MG tablet Take 10 mg by mouth daily.  6   Cyanocobalamin  (VITAMIN B-12) 5000 MCG TBDP Take 5,000 mcg by mouth daily.     diphenoxylate -atropine  (LOMOTIL ) 2.5-0.025 MG tablet Take 1-2 tablets by mouth 4 (four) times daily as needed for diarrhea or loose stools. 60 tablet 0   ibuprofen  (ADVIL ) 800 MG tablet Take 1 tablet (800 mg total) by mouth every 8 (eight) hours as needed. 30 tablet 0   lidocaine -prilocaine  (EMLA ) cream Apply to affected area once 30 g 3   LORazepam  (ATIVAN ) 1 MG tablet Take 1 tablet (1 mg total) by mouth every 8 (eight) hours. 10 tablet 0   magnesium  oxide (MAG-OX) 400 MG tablet TAKE 1 TABLET BY MOUTH EVERY DAY 30 tablet 1   metoprolol  succinate (TOPROL  XL) 25 MG 24 hr tablet Take 1 tablet (25 mg total) by mouth daily. 90 tablet 3   Omega-3 Fatty Acids (FISH OIL MAXIMUM STRENGTH) 1200 MG CPDR Take 1,200 mg by mouth daily.     Vitamin D , Ergocalciferol , (DRISDOL ) 1.25 MG (50000 UNIT) CAPS capsule Take 1 capsule (50,000 Units total) by mouth once a week. 12 capsule 2   acetaminophen  (TYLENOL ) 500 MG tablet Take 500 mg by mouth every 6 (six) hours as needed. For pain (Patient not taking: Reported on 02/07/2024)     loratadine   (CLARITIN ) 10 MG tablet Take 1 tablet (10 mg total) by mouth daily as needed for allergies. (Patient not taking: Reported on 03/05/2024) 90 tablet 2   ondansetron  (ZOFRAN ) 8 MG tablet Take 1 tablet (8 mg total) by mouth 2 (two) times daily. (Patient not taking: Reported on 02/07/2024) 60 tablet 6   prochlorperazine  (COMPAZINE ) 10 MG tablet Take 1 tablet (10 mg total) by mouth every 6 (six) hours as needed for nausea or vomiting. (Patient not taking: Reported on 02/07/2024) 30 tablet 6   No current facility-administered medications for this visit.    PHYSICAL EXAMINATION: ECOG PERFORMANCE STATUS: 1 - Symptomatic but completely ambulatory  Vitals:   03/05/24 1048  BP: (!) 142/88  Pulse: (!) 106  Resp: 18  Temp: 98 F (36.7 C)  SpO2: 97%   Filed Weights   03/05/24 1048  Weight: 206 lb (93.4 kg)    Physical Exam Several small subcutaneous nodules in the chest wall   (exam performed in the presence of a chaperone)  LABORATORY DATA:  I have reviewed the data as listed    Latest Ref Rng & Units 02/28/2024    8:12 AM 02/14/2024    9:52 AM 02/07/2024   10:00 AM  CMP  Glucose 70 - 99 mg/dL 161  096  045   BUN 6 - 20  mg/dL 14  14  19    Creatinine 0.44 - 1.00 mg/dL 9.14  7.82  9.56   Sodium 135 - 145 mmol/L 138  139  139   Potassium 3.5 - 5.1 mmol/L 3.3  3.4  3.6   Chloride 98 - 111 mmol/L 103  103  105   CO2 22 - 32 mmol/L 27  28  27    Calcium 8.9 - 10.3 mg/dL 9.1  9.3  9.6   Total Protein 6.5 - 8.1 g/dL 7.3  7.2  7.3   Total Bilirubin 0.0 - 1.2 mg/dL 0.4  0.3  0.4   Alkaline Phos 38 - 126 U/L 98  106  98   AST 15 - 41 U/L 14  13  13    ALT 0 - 44 U/L 10  10  9      Lab Results  Component Value Date   WBC 6.2 03/05/2024   HGB 10.2 (L) 03/05/2024   HCT 30.7 (L) 03/05/2024   MCV 89.5 03/05/2024   PLT 88 (L) 03/05/2024   NEUTROABS 5.2 03/05/2024    ASSESSMENT & PLAN:  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) 06/30/2011: Left breast cancer stage III triple  negative, 7.6 cm 09/14/2011: Dose dense FEC followed by dose dense Taxotere  neoadjuvant chemo 12/21/2011: Left lumpectomy: High-grade poorly differentiated IDC 1.7 cm 3/7 nodes positive triple negative, XRT 09/26/2017: Relapse: Right axillary lymph node biopsy positive for metastatic high-grade carcinoma triple negative 11/14/2017: Neoadjuvant Gemzar  and carboplatin  05/31/2023: Relapse: Mediastinal mass biopsy: Metastatic poorly differentiated carcinoma ER 60%, HER2 negative 06/06/2023: Verzinio with letrozole  07/25/2023: Trodelvy  02/21/2024: Chest wall skin biopsy: Poorly differentiated carcinoma ER 0% PR 0% Ki-67 40%, HER2 0  Treatment plan:  Recommended switching treatment from Trodelvy  to Eribulin or Keytruda  We will request Dr. Delane Fear to evaluate her for surgical options Patient would like to go to [facility] for evaluation for clinical trials/treatment options Will request [facility] molecular testing on the skin biopsy material   Until the new treatment is established we will continue with existing treatment with Trodelvy .  No orders of the defined types were placed in this encounter.  The patient has a good understanding of the overall plan. she agrees with it. she will call with any problems that may develop before the next visit here. Total time spent: 45  mins including face to face time and time spent for planning, charting and co-ordination of care   Viinay K Tamiya Colello, MD 03/05/24

## 2024-03-06 ENCOUNTER — Encounter: Payer: Self-pay | Admitting: *Deleted

## 2024-03-06 ENCOUNTER — Inpatient Hospital Stay

## 2024-03-06 VITALS — BP 140/78 | HR 99 | Temp 98.4°F | Resp 18 | Wt 205.0 lb

## 2024-03-06 DIAGNOSIS — C50919 Malignant neoplasm of unspecified site of unspecified female breast: Secondary | ICD-10-CM

## 2024-03-06 DIAGNOSIS — N6331 Unspecified lump in axillary tail of the right breast: Secondary | ICD-10-CM

## 2024-03-06 DIAGNOSIS — C50012 Malignant neoplasm of nipple and areola, left female breast: Secondary | ICD-10-CM | POA: Diagnosis not present

## 2024-03-06 MED ORDER — HEPARIN SOD (PORK) LOCK FLUSH 100 UNIT/ML IV SOLN: 500.0000 [IU] | Freq: Once | INTRAVENOUS | Status: DC | PRN

## 2024-03-06 MED ORDER — FAMOTIDINE IN NACL 20-0.9 MG/50ML-% IV SOLN
20.0000 mg | Freq: Once | INTRAVENOUS | Status: AC
  Administered 2024-03-06: 20 mg via INTRAVENOUS
  Filled 2024-03-06: qty 50

## 2024-03-06 MED ORDER — SODIUM CHLORIDE 0.9 % IV SOLN
150.0000 mg | Freq: Once | INTRAVENOUS | Status: AC
  Administered 2024-03-06: 150 mg via INTRAVENOUS
  Filled 2024-03-06: qty 150

## 2024-03-06 MED ORDER — PALONOSETRON HCL INJECTION 0.25 MG/5ML
0.2500 mg | Freq: Once | INTRAVENOUS | Status: AC
  Administered 2024-03-06: 0.25 mg via INTRAVENOUS
  Filled 2024-03-06: qty 5

## 2024-03-06 MED ORDER — SODIUM CHLORIDE 0.9% FLUSH: 10.0000 mL | INTRAVENOUS | Status: DC | PRN

## 2024-03-06 MED ORDER — SODIUM CHLORIDE 0.9 % IV SOLN
460.0000 mg | Freq: Once | INTRAVENOUS | Status: AC
  Administered 2024-03-06: 460 mg via INTRAVENOUS
  Filled 2024-03-06: qty 46

## 2024-03-06 MED ORDER — DIPHENHYDRAMINE HCL 50 MG/ML IJ SOLN
50.0000 mg | Freq: Once | INTRAMUSCULAR | Status: AC
  Administered 2024-03-06: 50 mg via INTRAVENOUS
  Filled 2024-03-06: qty 1

## 2024-03-06 MED ORDER — SODIUM CHLORIDE 0.9 % IV SOLN: Freq: Once | INTRAVENOUS | Status: AC

## 2024-03-06 MED ORDER — ACETAMINOPHEN 325 MG PO TABS
650.0000 mg | ORAL_TABLET | Freq: Once | ORAL | Status: AC
  Administered 2024-03-06: 650 mg via ORAL
  Filled 2024-03-06: qty 2

## 2024-03-06 MED ORDER — DEXAMETHASONE SODIUM PHOSPHATE 10 MG/ML IJ SOLN
10.0000 mg | Freq: Once | INTRAMUSCULAR | Status: AC
Start: 2024-03-06 — End: 2024-03-06
  Administered 2024-03-06: 10 mg via INTRAVENOUS
  Filled 2024-03-06: qty 1

## 2024-03-06 NOTE — Progress Notes (Signed)
 Received message from [facility] team stating a letter of medical necessity is needed to complete recent Caris testing request.  RN successfully faxed letter to [facility].

## 2024-03-06 NOTE — Patient Instructions (Signed)
 [facility]  Discharge Instructions: Thank you for choosing [facility] to provide your oncology and hematology care.   If you have a lab appointment with the Cancer Center, please go directly to the Cancer Center and check in at the registration area.   Wear comfortable clothing and clothing appropriate for easy access to any Portacath or PICC line.   We strive to give you quality time with your provider. You may need to reschedule your appointment if you arrive late (15 or more minutes).  Arriving late affects you and other patients whose appointments are after yours.  Also, if you miss three or more appointments without notifying the office, you may be dismissed from the clinic at the provider's discretion.      For prescription refill requests, have your pharmacy contact our office and allow 72 hours for refills to be completed.    Today you received the following chemotherapy and/or immunotherapy agents: [name]      To help prevent nausea and vomiting after your treatment, we encourage you to take your nausea medication as directed.  BELOW ARE SYMPTOMS THAT SHOULD BE REPORTED IMMEDIATELY: *FEVER GREATER THAN 100.4 F (38 C) OR HIGHER *CHILLS OR SWEATING *NAUSEA AND VOMITING THAT IS NOT CONTROLLED WITH YOUR NAUSEA MEDICATION *UNUSUAL SHORTNESS OF BREATH *UNUSUAL BRUISING OR BLEEDING *URINARY PROBLEMS (pain or burning when urinating, or frequent urination) *BOWEL PROBLEMS (unusual diarrhea, constipation, pain near the anus) TENDERNESS IN MOUTH AND THROAT WITH OR WITHOUT PRESENCE OF ULCERS (sore throat, sores in mouth, or a toothache) UNUSUAL RASH, SWELLING OR PAIN  UNUSUAL VAGINAL DISCHARGE OR ITCHING   Items with * indicate a potential emergency and should be followed up as soon as possible or go to the Emergency Department if any problems should occur.  Please show the CHEMOTHERAPY ALERT CARD or IMMUNOTHERAPY  ALERT CARD at check-in to the Emergency Department and triage nurse.  Should you have questions after your visit or need to cancel or reschedule your appointment, please contact [facility]  Dept: [phone]  and follow the prompts.  Office hours are 8:00 a.m. to 4:30 p.m. Monday - Friday. Please note that voicemails left after 4:00 p.m. may not be returned until the following business day.  We are closed weekends and major holidays. You have access to a nurse at all times for urgent questions. Please call the main number to the clinic Dept: [phone] and follow the prompts.   For any non-urgent questions, you may also contact your provider using MyChart. We now offer e-Visits for anyone 66 and older to request care online for non-urgent symptoms. For details visit [email or web address].   Also download the MyChart app! Go to the app store, search "MyChart", open the app, select [place], and log in with your MyChart username and password.

## 2024-03-12 ENCOUNTER — Ambulatory Visit (INDEPENDENT_AMBULATORY_CARE_PROVIDER_SITE_OTHER): Payer: 59

## 2024-03-12 DIAGNOSIS — I495 Sick sinus syndrome: Secondary | ICD-10-CM

## 2024-03-13 LAB — CUP PACEART REMOTE DEVICE CHECK
Battery Remaining Longevity: 106 mo
Battery Voltage: 3.02 V
Brady Statistic AP VP Percent: 0.01 %
Brady Statistic AP VS Percent: 0 %
Brady Statistic AS VP Percent: 99.67 %
Brady Statistic AS VS Percent: 0.32 %
Brady Statistic RA Percent Paced: 0.01 %
Brady Statistic RV Percent Paced: 99.68 %
Date Time Interrogation Session: 20250518191446
Implantable Lead Connection Status: 753985
Implantable Lead Connection Status: 753985
Implantable Lead Implant Date: 20240417
Implantable Lead Implant Date: 20240417
Implantable Lead Location: 753859
Implantable Lead Location: 753860
Implantable Lead Model: 3830
Implantable Lead Model: 5076
Implantable Pulse Generator Implant Date: 20240417
Lead Channel Impedance Value: 247 Ohm
Lead Channel Impedance Value: 304 Ohm
Lead Channel Impedance Value: 399 Ohm
Lead Channel Impedance Value: 551 Ohm
Lead Channel Pacing Threshold Amplitude: 0.625 V
Lead Channel Pacing Threshold Amplitude: 1.5 V
Lead Channel Pacing Threshold Pulse Width: 0.4 ms
Lead Channel Pacing Threshold Pulse Width: 0.4 ms
Lead Channel Sensing Intrinsic Amplitude: 1.875 mV
Lead Channel Sensing Intrinsic Amplitude: 1.875 mV
Lead Channel Sensing Intrinsic Amplitude: 18 mV
Lead Channel Sensing Intrinsic Amplitude: 18 mV
Lead Channel Setting Pacing Amplitude: 1.5 V
Lead Channel Setting Pacing Amplitude: 3 V
Lead Channel Setting Pacing Pulse Width: 0.4 ms
Lead Channel Setting Sensing Sensitivity: 0.9 mV
Zone Setting Status: 755011
Zone Setting Status: 755011

## 2024-03-14 ENCOUNTER — Ambulatory Visit: Payer: Self-pay | Admitting: Internal Medicine

## 2024-03-16 ENCOUNTER — Other Ambulatory Visit: Payer: Self-pay | Admitting: Hematology and Oncology

## 2024-03-17 ENCOUNTER — Inpatient Hospital Stay

## 2024-03-17 VITALS — BP 149/89 | HR 100 | Temp 98.2°F | Resp 17

## 2024-03-17 DIAGNOSIS — C50012 Malignant neoplasm of nipple and areola, left female breast: Secondary | ICD-10-CM | POA: Diagnosis not present

## 2024-03-17 DIAGNOSIS — N6331 Unspecified lump in axillary tail of the right breast: Secondary | ICD-10-CM

## 2024-03-17 DIAGNOSIS — C50919 Malignant neoplasm of unspecified site of unspecified female breast: Secondary | ICD-10-CM

## 2024-03-17 MED ORDER — FILGRASTIM-SNDZ 480 MCG/0.8ML IJ SOSY
480.0000 ug | PREFILLED_SYRINGE | Freq: Once | INTRAMUSCULAR | Status: AC
  Administered 2024-03-17: 480 ug via SUBCUTANEOUS
  Filled 2024-03-17: qty 0.8

## 2024-03-20 MED FILL — Fosaprepitant Dimeglumine For IV Infusion 150 MG (Base Eq): INTRAVENOUS | Qty: 5 | Status: AC

## 2024-03-21 ENCOUNTER — Inpatient Hospital Stay

## 2024-03-21 ENCOUNTER — Inpatient Hospital Stay (HOSPITAL_BASED_OUTPATIENT_CLINIC_OR_DEPARTMENT_OTHER): Admitting: Hematology and Oncology

## 2024-03-21 ENCOUNTER — Encounter: Payer: Self-pay | Admitting: Hematology and Oncology

## 2024-03-21 VITALS — BP 122/86 | HR 113 | Temp 98.0°F | Resp 18 | Ht 66.0 in | Wt 206.9 lb

## 2024-03-21 VITALS — HR 98

## 2024-03-21 DIAGNOSIS — C771 Secondary and unspecified malignant neoplasm of intrathoracic lymph nodes: Secondary | ICD-10-CM

## 2024-03-21 DIAGNOSIS — C50919 Malignant neoplasm of unspecified site of unspecified female breast: Secondary | ICD-10-CM

## 2024-03-21 DIAGNOSIS — N6331 Unspecified lump in axillary tail of the right breast: Secondary | ICD-10-CM

## 2024-03-21 DIAGNOSIS — Z95828 Presence of other vascular implants and grafts: Secondary | ICD-10-CM

## 2024-03-21 DIAGNOSIS — C50012 Malignant neoplasm of nipple and areola, left female breast: Secondary | ICD-10-CM | POA: Diagnosis not present

## 2024-03-21 LAB — CBC WITH DIFFERENTIAL (CANCER CENTER ONLY)
Abs Immature Granulocytes: 0.24 10*3/uL — ABNORMAL HIGH (ref 0.00–0.07)
Basophils Absolute: 0 10*3/uL (ref 0.0–0.1)
Basophils Relative: 1 %
Eosinophils Absolute: 0 10*3/uL (ref 0.0–0.5)
Eosinophils Relative: 0 %
HCT: 30.9 % — ABNORMAL LOW (ref 36.0–46.0)
Hemoglobin: 10.2 g/dL — ABNORMAL LOW (ref 12.0–15.0)
Immature Granulocytes: 11 %
Lymphocytes Relative: 10 %
Lymphs Abs: 0.2 10*3/uL — ABNORMAL LOW (ref 0.7–4.0)
MCH: 30 pg (ref 26.0–34.0)
MCHC: 33 g/dL (ref 30.0–36.0)
MCV: 90.9 fL (ref 80.0–100.0)
Monocytes Absolute: 0.2 10*3/uL (ref 0.1–1.0)
Monocytes Relative: 10 %
Neutro Abs: 1.5 10*3/uL — ABNORMAL LOW (ref 1.7–7.7)
Neutrophils Relative %: 68 %
Platelet Count: 87 10*3/uL — ABNORMAL LOW (ref 150–400)
RBC: 3.4 MIL/uL — ABNORMAL LOW (ref 3.87–5.11)
RDW: 17.6 % — ABNORMAL HIGH (ref 11.5–15.5)
WBC Count: 2.2 10*3/uL — ABNORMAL LOW (ref 4.0–10.5)
nRBC: 0 % (ref 0.0–0.2)

## 2024-03-21 LAB — CMP (CANCER CENTER ONLY)
ALT: 12 U/L (ref 0–44)
AST: 19 U/L (ref 15–41)
Albumin: 3.9 g/dL (ref 3.5–5.0)
Alkaline Phosphatase: 103 U/L (ref 38–126)
Anion gap: 13 (ref 5–15)
BUN: 17 mg/dL (ref 6–20)
CO2: 23 mmol/L (ref 22–32)
Calcium: 9.1 mg/dL (ref 8.9–10.3)
Chloride: 100 mmol/L (ref 98–111)
Creatinine: 0.94 mg/dL (ref 0.44–1.00)
GFR, Estimated: >60 mL/min (ref >60)
Glucose, Bld: 139 mg/dL — ABNORMAL HIGH (ref 70–99)
Potassium: 3.1 mmol/L — ABNORMAL LOW (ref 3.5–5.1)
Sodium: 136 mmol/L (ref 135–145)
Total Bilirubin: 0.6 mg/dL (ref 0.0–1.2)
Total Protein: 7.3 g/dL (ref 6.5–8.1)

## 2024-03-21 LAB — MAGNESIUM: Magnesium: 1.7 mg/dL (ref 1.7–2.4)

## 2024-03-21 LAB — PHOSPHORUS: Phosphorus: 3.5 mg/dL (ref 2.5–4.6)

## 2024-03-21 MED ORDER — SODIUM CHLORIDE 0.9 % IV SOLN
150.0000 mg | Freq: Once | INTRAVENOUS | Status: AC
  Administered 2024-03-21: 150 mg via INTRAVENOUS
  Filled 2024-03-21: qty 150

## 2024-03-21 MED ORDER — FAMOTIDINE IN NACL 20-0.9 MG/50ML-% IV SOLN
20.0000 mg | Freq: Once | INTRAVENOUS | Status: AC
  Administered 2024-03-21: 20 mg via INTRAVENOUS
  Filled 2024-03-21: qty 50

## 2024-03-21 MED ORDER — SODIUM CHLORIDE 0.9 % IV SOLN
460.0000 mg | Freq: Once | INTRAVENOUS | Status: AC
  Administered 2024-03-21: 460 mg via INTRAVENOUS
  Filled 2024-03-21: qty 46

## 2024-03-21 MED ORDER — ATROPINE SULFATE 1 MG/ML IV SOLN
0.5000 mg | Freq: Once | INTRAVENOUS | Status: DC | PRN
Start: 2024-03-21 — End: 2024-03-21

## 2024-03-21 MED ORDER — SODIUM CHLORIDE 0.9% FLUSH
10.0000 mL | INTRAVENOUS | Status: DC | PRN
  Administered 2024-03-21: 10 mL

## 2024-03-21 MED ORDER — PALONOSETRON HCL INJECTION 0.25 MG/5ML
0.2500 mg | Freq: Once | INTRAVENOUS | Status: AC
  Administered 2024-03-21: 0.25 mg via INTRAVENOUS
  Filled 2024-03-21: qty 5

## 2024-03-21 MED ORDER — SODIUM CHLORIDE 0.9 % IV SOLN: Freq: Once | INTRAVENOUS | Status: AC

## 2024-03-21 MED ORDER — HEPARIN SOD (PORK) LOCK FLUSH 100 UNIT/ML IV SOLN
500.0000 [IU] | Freq: Once | INTRAVENOUS | Status: AC | PRN
  Administered 2024-03-21: 500 [IU]

## 2024-03-21 MED ORDER — DIPHENHYDRAMINE HCL 50 MG/ML IJ SOLN
50.0000 mg | Freq: Once | INTRAMUSCULAR | Status: AC
  Administered 2024-03-21: 50 mg via INTRAVENOUS
  Filled 2024-03-21: qty 1

## 2024-03-21 MED ORDER — ACETAMINOPHEN 325 MG PO TABS
650.0000 mg | ORAL_TABLET | Freq: Once | ORAL | Status: AC
  Administered 2024-03-21: 650 mg via ORAL
  Filled 2024-03-21: qty 2

## 2024-03-21 MED ORDER — DEXAMETHASONE SODIUM PHOSPHATE 10 MG/ML IJ SOLN
10.0000 mg | Freq: Once | INTRAMUSCULAR | Status: AC
  Administered 2024-03-21: 10 mg via INTRAVENOUS
  Filled 2024-03-21: qty 1

## 2024-03-21 MED ORDER — SODIUM CHLORIDE 0.9% FLUSH
10.0000 mL | Freq: Once | INTRAVENOUS | Status: AC
  Administered 2024-03-21: 10 mL

## 2024-03-21 NOTE — Patient Instructions (Signed)
 [facility] - A DEPT OF [facility]  Discharge Instructions: Thank you for choosing [facility] to provide your oncology and hematology care.   If you have a lab appointment with the Cancer Center, please go directly to the Cancer Center and check in at the registration area.   Wear comfortable clothing and clothing appropriate for easy access to any Portacath or PICC line.   We strive to give you quality time with your provider. You may need to reschedule your appointment if you arrive late (15 or more minutes).  Arriving late affects you and other patients whose appointments are after yours.  Also, if you miss three or more appointments without notifying the office, you may be dismissed from the clinic at the provider's discretion.      For prescription refill requests, have your pharmacy contact our office and allow 72 hours for refills to be completed.    Today you received the following chemotherapy and/or immunotherapy agents: Jessica Simpson      To help prevent nausea and vomiting after your treatment, we encourage you to take your nausea medication as directed.  BELOW ARE SYMPTOMS THAT SHOULD BE REPORTED IMMEDIATELY: *FEVER GREATER THAN 100.4 F (38 C) OR HIGHER *CHILLS OR SWEATING *NAUSEA AND VOMITING THAT IS NOT CONTROLLED WITH YOUR NAUSEA MEDICATION *UNUSUAL SHORTNESS OF BREATH *UNUSUAL BRUISING OR BLEEDING *URINARY PROBLEMS (pain or burning when urinating, or frequent urination) *BOWEL PROBLEMS (unusual diarrhea, constipation, pain near the anus) TENDERNESS IN MOUTH AND THROAT WITH OR WITHOUT PRESENCE OF ULCERS (sore throat, sores in mouth, or a toothache) UNUSUAL RASH, SWELLING OR PAIN  UNUSUAL VAGINAL DISCHARGE OR ITCHING   Items with * indicate a potential emergency and should be followed up as soon as possible or go to the Emergency Department if any problems should occur.  Please show the CHEMOTHERAPY ALERT CARD or IMMUNOTHERAPY  ALERT CARD at check-in to the Emergency Department and triage nurse.  Should you have questions after your visit or need to cancel or reschedule your appointment, please contact [facility] - A DEPT OF [facility]  Dept: [phone]  and follow the prompts.  Office hours are 8:00 a.m. to 4:30 p.m. Monday - Friday. Please note that voicemails left after 4:00 p.m. may not be returned until the following business day.  We are closed weekends and major holidays. You have access to a nurse at all times for urgent questions. Please call the main number to the clinic Dept: [phone] and follow the prompts.   For any non-urgent questions, you may also contact your provider using MyChart. We now offer e-Visits for anyone 66 and older to request care online for non-urgent symptoms. For details visit [email or web address].   Also download the MyChart app! Go to the app store, search "MyChart", open the app, select [facility], and log in with your MyChart username and password.

## 2024-03-21 NOTE — Assessment & Plan Note (Signed)
 06/30/2011: Left breast cancer stage III triple negative, 7.6 cm 09/14/2011: Dose dense FEC followed by dose dense Taxotere  neoadjuvant chemo 12/21/2011: Left lumpectomy: High-grade poorly differentiated IDC 1.7 cm 3/7 nodes positive triple negative, XRT 09/26/2017: Relapse: Right axillary lymph node biopsy positive for metastatic high-grade carcinoma triple negative 11/14/2017: Neoadjuvant Gemzar  and carboplatin  05/31/2023: Relapse: Mediastinal mass biopsy: Metastatic poorly differentiated carcinoma ER 60%, HER2 negative 06/06/2023: Verzinio with letrozole  07/25/2023: Trodelvy  02/21/2024: Chest wall skin biopsy: Poorly differentiated carcinoma ER 0% PR 0% Ki-67 40%, HER2 0 ------------------------------------------------------------------------------------------------------------------------------------- Surgical options: Limited given the extent of cutaneous involvement. Second opinion at [facility]: Caris molecular testing:  Treatment plan:

## 2024-03-21 NOTE — Progress Notes (Signed)
 Pt declined to stay for 30 min observation post Trodelvy  infusion. Tolerated well. No adverse s/s. VSS. Pt ambulated independently to lobby for d/c with family.

## 2024-03-21 NOTE — Progress Notes (Signed)
 Patient Care Team: Serita Danes, MD as PCP - General (Family Medicine) Cameron Cea, MD as Consulting Physician (Hematology and Oncology) Pickenpack-Cousar, Giles Labrum, NP as Nurse Practitioner [facility] and Palliative Medicine)  DIAGNOSIS:  Encounter Diagnosis  Name Primary?   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)  06/30/2011 Initial Diagnosis   Breast cancer, IDC, Left, Stage III, Triple negative, 7.6 cm breast mass and palpable axillary mass   09/06/2011 Miscellaneous   BRCA 1 and 2: Negative   09/14/2011 - 11/23/2011 Neo-Adjuvant Chemotherapy   Dose dense FEC followed by dose dense Taxotere    12/21/2011 Surgery   Left lumpectomy: High-grade poorly differentiated IDC 1.7 cm with high-grade DCIS, margins negative, 3/7 lymph nodes positive, ER 0%, PR 0%, HER-2 negative ratio 1.33 T1CN1 stage IIb   12/31/2011 - 02/15/2012 Radiation Therapy   Radiation at [facility]   09/26/2017 Relapse/Recurrence   Left breast upper outer quadrant within the lumpectomy bed: Fibrosis no malignancy, right axillary lymph node biopsy metastatic high-grade carcinoma ER 0%, PR 0%, Ki-67 90%, HER-2 negative ratio 1.11 (similar to previous ductal carcinoma)   11/14/2017 - 03/06/2018 Neo-Adjuvant Chemotherapy   Neo-Adjuvant chemotherapy with Gemzar  and carboplatin  days 1 and 8 every 3 weeks   01/10/2018 Genetic Testing   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  The Hereditary Gene Panel offered by [facility] includes sequencing and/or deletion duplication testing of the following 47 genes: APC, ATM, AXIN2, BARD1, BMPR1A, BRCA1, BRCA2, BRIP1, CDH1, CDK4, CDKN2A (p14ARF), CDKN2A (p16INK4a), CHEK2, CTNNA1, DICER1, EPCAM (Deletion/duplication testing only), GREM1 (promoter region deletion/duplication testing  only), KIT, MEN1, MLH1, MSH2, MSH3, MSH6, MUTYH, NBN, NF1, NHTL1, PALB2, PDGFRA, PMS2, POLD1, POLE, PTEN, RAD50, RAD51C, RAD51D, SDHB, SDHC, SDHD, SMAD4, SMARCA4. STK11, TP53, TSC1, TSC2, and VHL.  The following genes were evaluated for sequence changes only: SDHA and HOXB13 c.251G>A variant only. The report date is January 10, 2018.    05/31/2023 Procedure   Mediastinal mass biopsy: Metastatic poorly differentiated carcinoma compatible with breast primary ER +60%, PR 0%, HER2 0, Ki-67 40%    06/02/2023 PET scan   PET/CT Anterior mediastinal mass is hypermetabolic and centrally necrotic, soft tissue lesions anterior to the heart, 10 mm pleural-based lesion, level 2 cervical nodes, hypermetabolism hepatic dome mottled FDG accumulation throughout the skeletal system    06/06/2023 - 07/14/2023 Anti-estrogen oral therapy   Antiestrogen therapy with Verzenio  and letrozole     07/07/2023 Relapse/Recurrence   Increasing mediastinal mass 11.6 cm (used to be 10.8 cm) effacing the heart and involving the sternum, additional mediastinal/pericardial lymph nodes increased right extra lymph node 1.5 cm, nodules 0.2 cm (used to be 0.8 cm), few new scattered lung nodules)   07/25/2023 -  Chemotherapy   Patient is on Treatment Plan : BREAST METASTATIC Sacituzumab govitecan -hziy (Trodelvy ) D1,8 q21d     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)  04/06/2018 Surgery   Right axillary lymph node dissection: 1/11 node positive for metastatic high-grade carcinoma   04/25/2018 Initial Diagnosis   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)   04/25/2018 Cancer Staging   Staging form: Breast, AJCC 8th Edition - Clinical: Stage IIB (cT0, cN1, cM0, G3, ER-, PR-, HER2-) - Signed by Colie Dawes, MD on 04/25/2018   05/16/2018 - 06/22/2018 Radiation Therapy   Adjuvant radiation therapy with Xeloda    07/11/2018 -  10/24/2018 Chemotherapy   Adjuvant chemotherapy with CMF     CHIEF COMPLIANT: Follow-up on  Sacituzumab-Govitecan  HISTORY OF PRESENT ILLNESS: History of Present Illness Jessica Simpson is a 53 year old female with metastatic breast cancer who presents for chemotherapy treatment.  She experiences extreme fatigue on day one of her chemotherapy cycle, despite feeling well the previous two days. Her hemoglobin is 10.2, and her platelet count is 88,000. She awaits further blood work results, including neutrophil counts. She received a shot on Saturday, which is typically given on the same day as her treatment, usually Tuesday. She feels slightly better after chemotherapy, possibly due to a nap or steroids administered during treatment.  She has no pain but experiences itching and irritation, which she manages with Benadryl . No chest pain, shortness of breath, or palpitations are present. She awaits further communication about potential surgery and radiation options.     ALLERGIES:  is allergic to codeine and hydrocodone-acetaminophen .  MEDICATIONS:  Current Outpatient Medications  Medication Sig Dispense Refill   albuterol  (ACCUNEB ) 0.63 MG/3ML nebulizer solution Take 3 mLs (0.63 mg total) by nebulization every 6 (six) hours as needed for wheezing. 75 mL 12   amLODipine  (NORVASC ) 5 MG tablet Take 10 mg by mouth daily.  6   Cyanocobalamin  (VITAMIN B-12) 5000 MCG TBDP Take 5,000 mcg by mouth daily.     diphenoxylate -atropine  (LOMOTIL ) 2.5-0.025 MG tablet Take 1-2 tablets by mouth 4 (four) times daily as needed for diarrhea or loose stools. 60 tablet 0   ibuprofen  (ADVIL ) 800 MG tablet TAKE 1 TABLET BY MOUTH EVERY 8 HOURS AS NEEDED 30 tablet 0   lidocaine -prilocaine  (EMLA ) cream Apply to affected area once 30 g 3   LORazepam  (ATIVAN ) 1 MG tablet Take 1 tablet (1 mg total) by mouth every 8 (eight) hours. 10 tablet 0   magnesium  oxide (MAG-OX) 400 MG tablet TAKE 1 TABLET BY MOUTH EVERY DAY 30 tablet 1   metoprolol  succinate (TOPROL  XL) 25 MG 24 hr tablet Take 1 tablet (25 mg total)  by mouth daily. 90 tablet 3   Omega-3 Fatty Acids (FISH OIL MAXIMUM STRENGTH) 1200 MG CPDR Take 1,200 mg by mouth daily.     Vitamin D , Ergocalciferol , (DRISDOL ) 1.25 MG (50000 UNIT) CAPS capsule Take 1 capsule (50,000 Units total) by mouth once a week. 12 capsule 2   acetaminophen  (TYLENOL ) 500 MG tablet Take 500 mg by mouth every 6 (six) hours as needed. For pain (Patient not taking: Reported on 02/07/2024)     loratadine  (CLARITIN ) 10 MG tablet Take 1 tablet (10 mg total) by mouth daily as needed for allergies. (Patient not taking: Reported on 02/28/2024) 90 tablet 2   ondansetron  (ZOFRAN ) 8 MG tablet Take 1 tablet (8 mg total) by mouth 2 (two) times daily. (Patient not taking: Reported on 03/21/2024) 60 tablet 6   prochlorperazine  (COMPAZINE ) 10 MG tablet Take 1 tablet (10 mg total) by mouth every 6 (six) hours as needed for nausea or vomiting. (Patient not taking: Reported on 03/21/2024) 30 tablet 6   No current facility-administered medications for this visit.   Facility-Administered Medications Ordered in Other Visits  Medication Dose Route Frequency Provider Last Rate Last Admin   atropine  injection 0.5 mg  0.5 mg Intravenous Once PRN Cameron Cea, MD       heparin  lock flush 100 unit/mL  500 Units Intracatheter Once PRN Neville Pauls, MD       sacituzumab govitecan -hziy (TRODELVY ) 460 mg in sodium chloride  0.9 %  100 mL (3.1507 mg/mL) chemo infusion  460 mg Intravenous Once Elaria Osias, MD 73 mL/hr at 03/21/24 1329 460 mg at 03/21/24 1329   sodium chloride  flush (NS) 0.9 % injection 10 mL  10 mL Intracatheter PRN Cameron Cea, MD        PHYSICAL EXAMINATION: ECOG PERFORMANCE STATUS: 1 - Symptomatic but completely ambulatory  Vitals:   03/21/24 1034  BP: 122/86  Pulse: (!) 113  Resp: 18  Temp: 98 F (36.7 C)  SpO2: 100%   Filed Weights   03/21/24 1034  Weight: 206 lb 14.4 oz (93.8 kg)   Physical exam: Wheelchair-bound  LABORATORY DATA:  I have reviewed the data as  listed    Latest Ref Rng & Units 03/21/2024   10:00 AM 03/05/2024   10:26 AM 02/28/2024    8:12 AM  CMP  Glucose 70 - 99 mg/dL 161  93  096   BUN 6 - 20 mg/dL 17  17  14    Creatinine 0.44 - 1.00 mg/dL 0.45  4.09  8.11   Sodium 135 - 145 mmol/L 136  138  138   Potassium 3.5 - 5.1 mmol/L 3.1  3.4  3.3   Chloride 98 - 111 mmol/L 100  103  103   CO2 22 - 32 mmol/L 23  28  27    Calcium 8.9 - 10.3 mg/dL 9.1  9.2  9.1   Total Protein 6.5 - 8.1 g/dL 7.3  7.1  7.3   Total Bilirubin 0.0 - 1.2 mg/dL 0.6  0.4  0.4   Alkaline Phos 38 - 126 U/L 103  101  98   AST 15 - 41 U/L 19  12  14    ALT 0 - 44 U/L 12  7  10      Lab Results  Component Value Date   WBC 2.2 (L) 03/21/2024   HGB 10.2 (L) 03/21/2024   HCT 30.9 (L) 03/21/2024   MCV 90.9 03/21/2024   PLT 87 (L) 03/21/2024   NEUTROABS 1.5 (L) 03/21/2024    ASSESSMENT & PLAN:  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) 06/30/2011: Left breast cancer stage III triple negative, 7.6 cm 09/14/2011: Dose dense FEC followed by dose dense Taxotere  neoadjuvant chemo 12/21/2011: Left lumpectomy: High-grade poorly differentiated IDC 1.7 cm 3/7 nodes positive triple negative, XRT 09/26/2017: Relapse: Right axillary lymph node biopsy positive for metastatic high-grade carcinoma triple negative 11/14/2017: Neoadjuvant Gemzar  and carboplatin  05/31/2023: Relapse: Mediastinal mass biopsy: Metastatic poorly differentiated carcinoma ER 60%, HER2 negative 06/06/2023: Verzinio with letrozole  07/25/2023: Trodelvy  02/21/2024: Chest wall skin biopsy: Poorly differentiated carcinoma ER 0% PR 0% Ki-67 40%, HER2 0 ------------------------------------------------------------------------------------------------------------------------------------- Surgical options: Limited given the extent of cutaneous involvement. Second opinion at [facility]: Caris molecular testing:  Treatment plan:  ------------------------------------- Assessment and Plan Assessment &  Plan Metastatic breast cancer Fatigue and pruritus possibly due to chemotherapy. KRAS testing pending. Surgery not feasible. Awaiting radiation specialist feedback. - Await KRAS molecular testing results. - Explore clinical trial options. - Consult Dr. Lurena Sally with radiation specialist regarding films.  Anemia Hemoglobin at 10.2 g/dL. Monitoring needed for chemotherapy impact.  Thrombocytopenia Platelet count at 88,000-87,000. Monitoring needed for chemotherapy impact.  Return to clinic in 2 weeks for cycle 3 but before then we would likely switch her treatment based upon recommendations given by Waynette Hait molecular test will be available 01/22/2024.    No orders of the defined types were placed in this encounter.  The patient has a good understanding of the overall  plan. she agrees with it. she will call with any problems that may develop before the next visit here. Total time spent: 30 mins including face to face time and time spent for planning, charting and co-ordination of care   Margert Sheerer, MD 03/21/24

## 2024-03-24 ENCOUNTER — Inpatient Hospital Stay

## 2024-03-24 VITALS — BP 127/75 | HR 102 | Temp 98.9°F | Resp 18

## 2024-03-24 DIAGNOSIS — C50919 Malignant neoplasm of unspecified site of unspecified female breast: Secondary | ICD-10-CM

## 2024-03-24 DIAGNOSIS — N6331 Unspecified lump in axillary tail of the right breast: Secondary | ICD-10-CM

## 2024-03-24 DIAGNOSIS — C50012 Malignant neoplasm of nipple and areola, left female breast: Secondary | ICD-10-CM | POA: Diagnosis not present

## 2024-03-24 MED ORDER — FILGRASTIM-SNDZ 480 MCG/0.8ML IJ SOSY
480.0000 ug | PREFILLED_SYRINGE | Freq: Once | INTRAMUSCULAR | Status: AC
  Administered 2024-03-24: 480 ug via SUBCUTANEOUS

## 2024-03-26 ENCOUNTER — Other Ambulatory Visit: Payer: Self-pay

## 2024-03-26 ENCOUNTER — Telehealth: Payer: Self-pay | Admitting: Hematology and Oncology

## 2024-03-26 ENCOUNTER — Inpatient Hospital Stay

## 2024-03-26 ENCOUNTER — Encounter: Payer: Self-pay | Admitting: Hematology and Oncology

## 2024-03-26 ENCOUNTER — Other Ambulatory Visit: Payer: Self-pay | Admitting: Hematology and Oncology

## 2024-03-26 DIAGNOSIS — C50919 Malignant neoplasm of unspecified site of unspecified female breast: Secondary | ICD-10-CM

## 2024-03-26 NOTE — Telephone Encounter (Signed)
 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 Since she has an appointment next week at [facility], we will hold off on doing any further treatments this week. Based on her discussion next week we will initiate subsequent treatment on 04/09/2024. Regarding the port placement, we will plan to get a chest x-ray on the day of 04/09/2024 and look at the port tip.

## 2024-03-27 ENCOUNTER — Telehealth: Payer: Self-pay

## 2024-03-27 ENCOUNTER — Ambulatory Visit

## 2024-03-27 ENCOUNTER — Other Ambulatory Visit

## 2024-03-27 NOTE — Telephone Encounter (Signed)
 Pt third party Disability request for Medical Records.

## 2024-03-30 NOTE — Progress Notes (Signed)
 Location of Breast Cancer: Breast Cancer Metastasized to Intrathoracic Lymph Node   Location of Metastatic Disease: Intrathoracic Lymph Node  Histology per Pathology Report:    Receptor Status: ER(0%), PR (0%), Her2-neu (0%), Ki-67(Negative) 02/01/2024 CT Chest W Contrast    02/04/2024 CT Soft Tissue Neck W Contrast    Patient felt a lump on breast and brought it to her providers attention.  Past/Anticipated interventions by surgeon, if any: 12/21/2011 Left Lumpectomy   04/06/2018 Right axillary Lymph Node dissection   Past/Anticipated interventions by medical oncology, if any:  03/21/2024 Lee Public, MD   Lymphedema issues, if any:   Lymphedema on bilateral breasts   Pain issues, if any:   Yes  SAFETY ISSUES: Prior radiation? Yes Pacemaker/ICD? Yes Possible current pregnancy?N/A Is the patient on methotrexate ? Patient was on it for a few months.    Wt Readings from Last 3 Encounters:  04/04/24 210 lb 9.6 oz (95.5 kg)  03/21/24 206 lb 14.4 oz (93.8 kg)  03/06/24 205 lb (93 kg)

## 2024-04-03 ENCOUNTER — Inpatient Hospital Stay: Attending: Hematology and Oncology | Admitting: Hematology and Oncology

## 2024-04-03 ENCOUNTER — Telehealth: Payer: Self-pay | Admitting: Hematology and Oncology

## 2024-04-03 DIAGNOSIS — N6331 Unspecified lump in axillary tail of the right breast: Secondary | ICD-10-CM

## 2024-04-03 DIAGNOSIS — C771 Secondary and unspecified malignant neoplasm of intrathoracic lymph nodes: Secondary | ICD-10-CM | POA: Diagnosis not present

## 2024-04-03 DIAGNOSIS — Z171 Estrogen receptor negative status [ER-]: Secondary | ICD-10-CM | POA: Insufficient documentation

## 2024-04-03 DIAGNOSIS — Z801 Family history of malignant neoplasm of trachea, bronchus and lung: Secondary | ICD-10-CM | POA: Insufficient documentation

## 2024-04-03 DIAGNOSIS — I10 Essential (primary) hypertension: Secondary | ICD-10-CM | POA: Insufficient documentation

## 2024-04-03 DIAGNOSIS — Z5112 Encounter for antineoplastic immunotherapy: Secondary | ICD-10-CM | POA: Insufficient documentation

## 2024-04-03 DIAGNOSIS — C50012 Malignant neoplasm of nipple and areola, left female breast: Secondary | ICD-10-CM | POA: Insufficient documentation

## 2024-04-03 DIAGNOSIS — Z923 Personal history of irradiation: Secondary | ICD-10-CM | POA: Insufficient documentation

## 2024-04-03 DIAGNOSIS — Z9221 Personal history of antineoplastic chemotherapy: Secondary | ICD-10-CM | POA: Insufficient documentation

## 2024-04-03 DIAGNOSIS — Z5189 Encounter for other specified aftercare: Secondary | ICD-10-CM | POA: Insufficient documentation

## 2024-04-03 DIAGNOSIS — C50919 Malignant neoplasm of unspecified site of unspecified female breast: Secondary | ICD-10-CM

## 2024-04-03 DIAGNOSIS — J700 Acute pulmonary manifestations due to radiation: Secondary | ICD-10-CM | POA: Insufficient documentation

## 2024-04-03 DIAGNOSIS — Y842 Radiological procedure and radiotherapy as the cause of abnormal reaction of the patient, or of later complication, without mention of misadventure at the time of the procedure: Secondary | ICD-10-CM | POA: Insufficient documentation

## 2024-04-03 DIAGNOSIS — Z803 Family history of malignant neoplasm of breast: Secondary | ICD-10-CM | POA: Insufficient documentation

## 2024-04-03 DIAGNOSIS — C778 Secondary and unspecified malignant neoplasm of lymph nodes of multiple regions: Secondary | ICD-10-CM | POA: Insufficient documentation

## 2024-04-03 DIAGNOSIS — G4733 Obstructive sleep apnea (adult) (pediatric): Secondary | ICD-10-CM | POA: Insufficient documentation

## 2024-04-03 NOTE — Assessment & Plan Note (Signed)
 06/30/2011: Left breast cancer stage III triple negative, 7.6 cm 09/14/2011: Dose dense FEC followed by dose dense Taxotere  neoadjuvant chemo 12/21/2011: Left lumpectomy: High-grade poorly differentiated IDC 1.7 cm 3/7 nodes positive triple negative, XRT 09/26/2017: Relapse: Right axillary lymph node biopsy positive for metastatic high-grade carcinoma triple negative 11/14/2017: Neoadjuvant Gemzar  and carboplatin  05/31/2023: Relapse: Mediastinal mass biopsy: Metastatic poorly differentiated carcinoma ER 60%, HER2 negative 06/06/2023: Verzinio with letrozole  07/25/2023: Trodelvy  02/21/2024: Chest wall skin biopsy: Poorly differentiated carcinoma ER 0% PR 0% Ki-67 40%, HER2 0 ------------------------------------------------------------------------------------------------------------------------------------ Patient was referred for second opinion to [facility] molecular testing: PD-L1 mutation:

## 2024-04-03 NOTE — Progress Notes (Signed)
 HEMATOLOGY-ONCOLOGY TELEPHONE VISIT PROGRESS NOTE  I connected with our patient on 04/03/24 at  3:00 PM EDT by telephone and verified that I am speaking with the correct person using two identifiers.  I discussed the limitations, risks, security and privacy concerns of performing an evaluation and management service by telephone and the availability of in person appointments.  I also discussed with the patient that there may be a patient responsible charge related to this service. The patient expressed understanding and agreed to proceed.   History of Present Illness:   History of Present Illness Jessica Simpson is a 53 year old female with metastatic breast cancer who presents for discussion of her treatment regimen.  She is undergoing treatment with Trodelvy  for metastatic breast cancer with intrathoracic lymph node involvement. She experiences neutropenia, with a neutrophil count of 1.5, necessitating careful monitoring and potential dose adjustments. She prefers chemotherapy sessions on Tuesdays to streamline her schedule and minimize travel.    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)  06/30/2011 Initial Diagnosis   Breast cancer, IDC, Left, Stage III, Triple negative, 7.6 cm breast mass and palpable axillary mass   09/06/2011 Miscellaneous   BRCA 1 and 2: Negative   09/14/2011 - 11/23/2011 Neo-Adjuvant Chemotherapy   Dose dense FEC followed by dose dense Taxotere    12/21/2011 Surgery   Left lumpectomy: High-grade poorly differentiated IDC 1.7 cm with high-grade DCIS, margins negative, 3/7 lymph nodes positive, ER 0%, PR 0%, HER-2 negative ratio 1.33 T1CN1 stage IIb   12/31/2011 - 02/15/2012 Radiation Therapy   Radiation at [facility]   09/26/2017 Relapse/Recurrence   Left breast upper outer quadrant within the lumpectomy bed: Fibrosis no malignancy, right axillary lymph node biopsy metastatic high-grade carcinoma ER 0%, PR 0%, Ki-67 90%, HER-2  negative ratio 1.11 (similar to previous ductal carcinoma)   11/14/2017 - 03/06/2018 Neo-Adjuvant Chemotherapy   Neo-Adjuvant chemotherapy with Gemzar  and carboplatin  days 1 and 8 every 3 weeks   01/10/2018 Genetic Testing   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  The Hereditary Gene Panel offered by [facility] includes sequencing and/or deletion duplication testing of the following 47 genes: APC, ATM, AXIN2, BARD1, BMPR1A, BRCA1, BRCA2, BRIP1, CDH1, CDK4, CDKN2A (p14ARF), CDKN2A (p16INK4a), CHEK2, CTNNA1, DICER1, EPCAM (Deletion/duplication testing only), GREM1 (promoter region deletion/duplication testing only), KIT, MEN1, MLH1, MSH2, MSH3, MSH6, MUTYH, NBN, NF1, NHTL1, PALB2, PDGFRA, PMS2, POLD1, POLE, PTEN, RAD50, RAD51C, RAD51D, SDHB, SDHC, SDHD, SMAD4, SMARCA4. STK11, TP53, TSC1, TSC2, and VHL.  The following genes were evaluated for sequence changes only: SDHA and HOXB13 c.251G>A variant only. The report date is January 10, 2018.    05/31/2023 Procedure   Mediastinal mass biopsy: Metastatic poorly differentiated carcinoma compatible with breast primary ER +60%, PR 0%, HER2 0, Ki-67 40%    06/02/2023 PET scan   PET/CT Anterior mediastinal mass is hypermetabolic and centrally necrotic, soft tissue lesions anterior to the heart, 10 mm pleural-based lesion, level 2 cervical nodes, hypermetabolism hepatic dome mottled FDG accumulation throughout the skeletal system    06/06/2023 - 07/14/2023 Anti-estrogen oral therapy   Antiestrogen therapy with Verzenio  and letrozole     07/07/2023 Relapse/Recurrence   Increasing mediastinal mass 11.6 cm (used to be 10.8 cm) effacing the heart and involving the sternum, additional mediastinal/pericardial lymph nodes increased right extra lymph node 1.5 cm, nodules 0.2 cm (used to be 0.8 cm), few new scattered lung nodules)   07/25/2023 -  Chemotherapy   Patient is on Treatment  Plan : BREAST METASTATIC Sacituzumab govitecan -hziy  (Trodelvy ) D1,8 q21d     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)  04/06/2018 Surgery   Right axillary lymph node dissection: 1/11 node positive for metastatic high-grade carcinoma   04/25/2018 Initial Diagnosis   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)   04/25/2018 Cancer Staging   Staging form: Breast, AJCC 8th Edition - Clinical: Stage IIB (cT0, cN1, cM0, G3, ER-, PR-, HER2-) - Signed by Colie Dawes, MD on 04/25/2018   05/16/2018 - 06/22/2018 Radiation Therapy   Adjuvant radiation therapy with Xeloda    07/11/2018 - 10/24/2018 Chemotherapy   Adjuvant chemotherapy with [facility]     REVIEW OF SYSTEMS:   Constitutional: Denies fevers, chills or abnormal weight loss All other systems were reviewed with the patient and are negative. 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) 06/30/2011: Left breast cancer stage III triple negative, 7.6 cm 09/14/2011: Dose dense FEC followed by dose dense Taxotere  neoadjuvant chemo 12/21/2011: Left lumpectomy: High-grade poorly differentiated IDC 1.7 cm 3/7 nodes positive triple negative, XRT 09/26/2017: Relapse: Right axillary lymph node biopsy positive for metastatic high-grade carcinoma triple negative 11/14/2017: Neoadjuvant Gemzar  and carboplatin  05/31/2023: Relapse: Mediastinal mass biopsy: Metastatic poorly differentiated carcinoma ER 60%, HER2 negative 06/06/2023: Verzinio with letrozole  07/25/2023: Trodelvy  02/21/2024: Chest wall skin biopsy: Poorly differentiated carcinoma ER 0% PR 0% Ki-67 40%, HER2 0 ------------------------------------------------------------------------------------------------------------------------------------ Patient was referred for second opinion to [facility] molecular testing: PD-L1 positive --------------------------------- Assessment and Plan Will obtain PET CT scan for restaging Add Keytruda immunotherapy to Trodelvy  starting next  week. Assessment & Plan Metastatic breast cancer Metastatic breast cancer with intrathoracic lymph node involvement. Current treatment includes Trodelvy . Decision to add Keytruda based on study outcomes showing superior results with Trodelvy  and Keytruda combination. Keytruda's side effects include fatigue, nausea, diarrhea, and immune-related adverse effects such as hypothyroidism, rash, and liver enzyme changes. Monitoring of thyroid  function is planned due to hypothyroidism risk. - Order PET scan for future comparison and assessment of disease progression. - Add Keytruda to the treatment regimen with Trodelvy , administered every three weeks. - Administer Trodelvy  on day 1 and day 8 of the treatment cycle. - Monitor thyroid  hormone levels with each Keytruda administration. - Adjust Trodelvy  dose if necessary based on blood counts. - Work on getting Keytruda approved for treatment.  Follow-up Preference for Tuesday chemotherapy appointments to reduce travel burden and avoid conflicts with other appointments. - Attempt to reschedule chemotherapy appointments to Tuesdays.      I discussed the assessment and treatment plan with the patient. The patient was provided an opportunity to ask questions and all were answered. The patient agreed with the plan and demonstrated an understanding of the instructions. The patient was advised to call back or seek an in-person evaluation if the symptoms worsen or if the condition fails to improve as anticipated.   I provided 20 minutes of non-face-to-face time during this encounter.  This includes time for charting and coordination of care   Margert Sheerer, MD

## 2024-04-03 NOTE — Progress Notes (Signed)
 Radiation Oncology         [phone] ________________________________  Initial outpatient Re-Consultation  Name: Jessica Simpson MRN: [id number]  Date: 04/04/2024  DOB: [birth year]  CC:Serita Danes, MD  Cameron Cea, MD   REFERRING PHYSICIAN: Cameron Cea, MD  DIAGNOSIS:    ICD-10-CM   1. 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 S/p: right axillary lymph node dissection, radiation, and adjuvant chemotherapy now with skin metastases   History of left breast cancer diagnosed in 2012: s/p neoadjuvant FEC-Taxotere , lumpectomy, and radiation   Cancer Staging  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) Staging form: Breast, AJCC 7th Edition - Clinical: Stage IIIA (T3, N2, cM0) - Signed by Arthor Billet, MD on 08/03/2011 Specimen type: Core Needle Biopsy Histopathologic type: Infiltrating duct carcinoma, NOS Tumor size (mm): 66 Histologic grade (G): G3 Prognostic indicators: Triple negative. KI-67 of 99%    - Pathologic: No stage assigned - Unsigned Specimen type: Core Needle Biopsy Histopathologic type: Infiltrating duct carcinoma, NOS Tumor size (mm): 66 Prognostic indicators: Triple negative. 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) Staging form: Breast, AJCC 8th Edition - Clinical: Stage IIB (cT0, cN1, cM0, G3, ER-, PR-, HER2-) - Signed by Colie Dawes, MD on 04/25/2018 Histologic grading system: 3 grade system - Pathologic: No Stage Recommended (ypT0, pN1, cM0, G3, ER-, PR-, HER2-) - Signed by Colie Dawes, MD on 04/25/2018 Stage prefix: Post-therapy Neoadjuvant therapy: Yes Histologic grading system: 3 grade system Laterality: Right   CHIEF COMPLAINT: Here to discuss management of skin metastases from breast cancer  HISTORY OF PRESENT ILLNESS: Jessica Simpson is a 53 y.o. female who is known to me for her history of radiation for her breast carcinoma. She received radiation in 2013 to her left breast, August 2019 to her right breast and axilla, and again in which she completed to her left breast in 2013, and again in September of 2024 to her mediastinal mass metastasis.   During a follow up with Dr. Gudena on 02/07/24, patient complained of new itching skin lesions located on her chest along with increased fatigue and a chronic cough. CT soft neck showed mildly enlarged bilateral cervical and left supraclavicular lymph nodes measuring 10 mm and 11 mm, concerning for metastatic disease and bilateral paramediastinal opacities, left-greater-than-right, most typical of radiation fibrosis.    Subsequently, she was referred to Dr. Delane Fear on 02/21/24 to undergo a punch biopsy of chest lesion. Pathology indicated poorly differentiated carcinoma, favor metastatic breast primary. Immunohistochemistry stain for CK7 is positive.  The ER and PR are negative. Proliferation Marker Ki67:  40%. Her2 negative.        During most recent follow up with Dr. Gudena on 04/03/24, she continued to be on chemotherapy treatment consistent of Trodelvy  which started 07/25/2023. Dr. Gudena had recommended adding Keytruda to treatment regimen and ordered a PET scan for future comparison. He kindly referred her to us  to discuss radiation treatment options.   Patient states that she initially thought she had a rash from her bra, but states that these lesions have progressively gotten worse. She has tried hydrocortisone and Benadryl  cream with little/no relief. She also notes a 2-week history of right axillary nodules that appeared similarly to her chest nodules.     PREVIOUS RADIATION THERAPY:  Yes  First Treatment Date: 2023-08-15 - Last Treatment Date: 2023-08-26   Plan Name: Chest Site: Thorax Technique: IMRT Mode: Photon Dose Per Fraction: 3 Gy Prescribed Dose (Delivered /  Prescribed): 30 Gy / 30 Gy Prescribed Fxs (Delivered / Prescribed): 10 / 10  05/16/2018 - 06/22/2018:  Right Breast / 50.4 Gy in 28 fractions Right posterior axilla and SCV nodes / 50.4 Gy in 28 fractions  She completed a total dose of 60 Gray in 30 fractions on 02/29/2012. She receive 50 gray in 25 fractions to the left breast, supraclavicular, and axillary regions. She then received a boost to the left lumpectomy of 10 gray in 5 fractions   PAST MEDICAL HISTORY:  has a past medical history of BRCA1 negative (10/22/2011), BRCA2 negative (10/22/2011), Breast cancer, IDC, Left, Stage III, Triple negative (06/30/2011), Breast disorder, Contraceptive education (01/15/2016), Cough, Dyspnea, Family history of breast cancer, Fracture, History of breast cancer (07/22/2014), History of radiation therapy (05/16/18- 06/22/18), Hypertension, Lymphedema (06/15/2012), Lymphedema of arm, Papanicolaou smear of cervix with positive high risk human papilloma virus (HPV) test (12/25/2020), Personal history of chemotherapy, Personal history of radiation therapy, Positive fecal occult blood test (07/22/2014), Presence of permanent cardiac pacemaker, S/P radiation therapy (2013), Sleep apnea, and Status post chemotherapy (Comp. 11/23/11).    PAST SURGICAL HISTORY: Past Surgical History:  Procedure Laterality Date   anal ascess     turned into a fistula with extensive treatment   AXILLARY LYMPH NODE BIOPSY Right 04/06/2018   AXILLARY LYMPH NODE DISSECTION Right 04/06/2018   Procedure: RIGHT AXILLARY LYMPH NODE DISSECTION;  Surgeon: Enid Harry, MD;  Location: [facility] OR;  Service: General;  Laterality: Right;   BREAST BIOPSY Left 2012   BREAST BIOPSY Right 2019   BREAST LUMPECTOMY Left 2012   BREAST LUMPECTOMY Right 2019   rt axilla   BREAST SURGERY     CARDIAC ELECTROPHYSIOLOGY MAPPING AND ABLATION     CESAREAN SECTION     COLONOSCOPY N/A 08/14/2014   Procedure: COLONOSCOPY;  Surgeon: Ruby Corporal, MD;   Location: [facility];  Service: Endoscopy;  Laterality: N/A;  930   EVACUATION BREAST HEMATOMA  12/21/2011   Procedure: EVACUATION HEMATOMA BREAST;  Surgeon: Lockie Rima, MD;  Location: [facility] OR;  Service: General;  Laterality: Left;   HAND SURGERY     tumor on finger, left hand   INCISION AND DRAINAGE ABSCESS ANAL     x3   IR IMAGING GUIDED PORT INSERTION  08/01/2023   left breast biopsy with axillary biopsy     core bx   PORT-A-CATH REMOVAL  12/21/2011   Procedure: REMOVAL PORT-A-CATH;  Surgeon: Darcella Earnest, MD;  Location: [facility];  Service: General;  Laterality: N/A;   PORTACATH PLACEMENT  08/02/2011   dr Zana Hesselbach PLACEMENT Right 11/14/2017   Procedure: INSERTION PORT-A-CATH WITH US ;  Surgeon: Enid Harry, MD;  Location: [facility] OR;  Service: General;  Laterality: Right;   removal breast mass  2004   benign   TREATMENT FISTULA ANAL     TUMOR REMOVAL     on left hand   TUMOR REMOVAL     rt. eye    FAMILY HISTORY: family history includes Breast cancer in an other family member; Cancer in her maternal grandfather and maternal grandmother; Cancer (age of onset: 78) in her father.  SOCIAL HISTORY:  reports that she has never smoked. She has never used smokeless tobacco. She reports that she does not drink alcohol and  does not use drugs.  ALLERGIES: Codeine and Hydrocodone-acetaminophen   MEDICATIONS:  Current Outpatient Medications  Medication Sig Dispense Refill   acetaminophen  (TYLENOL ) 500 MG tablet Take 500 mg by mouth every 6 (six) hours as needed. For pain     albuterol  (ACCUNEB ) 0.63 MG/3ML nebulizer solution Take 3 mLs (0.63 mg total) by nebulization every 6 (six) hours as needed for wheezing. 75 mL 12   amLODipine  (NORVASC ) 5 MG tablet Take 10 mg by mouth daily.  6   Cyanocobalamin  (VITAMIN B-12) 5000 MCG TBDP Take 5,000 mcg by mouth daily.     diphenoxylate -atropine  (LOMOTIL ) 2.5-0.025 MG tablet Take 1-2 tablets by mouth 4 (four) times  daily as needed for diarrhea or loose stools. 60 tablet 0   ibuprofen  (ADVIL ) 800 MG tablet TAKE 1 TABLET BY MOUTH EVERY 8 HOURS AS NEEDED 30 tablet 0   lidocaine -prilocaine  (EMLA ) cream Apply to affected area once 30 g 3   loratadine  (CLARITIN ) 10 MG tablet Take 1 tablet (10 mg total) by mouth daily as needed for allergies. 90 tablet 2   magnesium  oxide (MAG-OX) 400 MG tablet TAKE 1 TABLET BY MOUTH EVERY DAY 30 tablet 1   metoprolol  succinate (TOPROL  XL) 25 MG 24 hr tablet Take 1 tablet (25 mg total) by mouth daily. 90 tablet 3   Omega-3 Fatty Acids (FISH OIL MAXIMUM STRENGTH) 1200 MG CPDR Take 1,200 mg by mouth daily.     ondansetron  (ZOFRAN ) 8 MG tablet Take 1 tablet (8 mg total) by mouth 2 (two) times daily. 60 tablet 6   prochlorperazine  (COMPAZINE ) 10 MG tablet Take 1 tablet (10 mg total) by mouth every 6 (six) hours as needed for nausea or vomiting. 30 tablet 6   Vitamin D , Ergocalciferol , (DRISDOL ) 1.25 MG (50000 UNIT) CAPS capsule Take 1 capsule (50,000 Units total) by mouth once a week. 12 capsule 2   LORazepam  (ATIVAN ) 1 MG tablet Take 1 tablet (1 mg total) by mouth every 8 (eight) hours. (Patient not taking: Reported on 04/04/2024) 10 tablet 0   No current facility-administered medications for this encounter.    REVIEW OF SYSTEMS:  Notable for that above.   PHYSICAL EXAM:  height is 5' 6 (1.676 m) and weight is 210 lb 9.6 oz (95.5 kg). Her temperature is 97.9 F (36.6 C). Her blood pressure is 145/80 (abnormal) and her pulse is 92. Her respiration is 20 and oxygen saturation is 100%.   In general this is a well appearing female in no acute distress. She's alert and oriented x4 and appropriate throughout the examination. Cardiopulmonary assessment is negative for acute distress and she exhibits normal effort.     Scattered nodules throughout the upper chest, bilateral breasts, and in the right axilla. Tender to palpation. No open lesions.   RIGHT CHEST   RIGHT AXILLA    ECOG  = 1  0 - Asymptomatic (Fully active, able to carry on all predisease activities without restriction)  1 - Symptomatic but completely ambulatory (Restricted in physically strenuous activity but ambulatory and able to carry out work of a light or sedentary nature. For example, light housework, office work)  2 - Symptomatic, <50% in bed during the day (Ambulatory and capable of all self care but unable to carry out any work activities. Up and about more than 50% of waking hours)  3 - Symptomatic, >50% in bed, but not bedbound (Capable of only limited self-care, confined to bed or chair 50% or more of waking hours)  4 - Bedbound (  Completely disabled. Cannot carry on any self-care. Totally confined to bed or chair)  5 - Death   Aurea Blossom MM, Creech RH, Tormey DC, et al. [phone]). Toxicity and response criteria of the [facility]. Am. Hillard Lowes. Oncol. 5 (6): 649-55   LABORATORY DATA:  Lab Results  Component Value Date   WBC 2.2 (L) 03/21/2024   HGB 10.2 (L) 03/21/2024   HCT 30.9 (L) 03/21/2024   MCV 90.9 03/21/2024   PLT 87 (L) 03/21/2024   CMP     Component Value Date/Time   NA 136 03/21/2024 1000   NA 140 07/26/2016 1650   K 3.1 (L) 03/21/2024 1000   CL 100 03/21/2024 1000   CO2 23 03/21/2024 1000   GLUCOSE 139 (H) 03/21/2024 1000   BUN 17 03/21/2024 1000   BUN 13 07/26/2016 1650   CREATININE 0.94 03/21/2024 1000   CALCIUM 9.1 03/21/2024 1000   PROT 7.3 03/21/2024 1000   PROT 7.4 07/26/2016 1650   ALBUMIN 3.9 03/21/2024 1000   ALBUMIN 4.2 07/26/2016 1650   AST 19 03/21/2024 1000   ALT 12 03/21/2024 1000   ALKPHOS 103 03/21/2024 1000   BILITOT 0.6 03/21/2024 1000   GFRNONAA >60 03/21/2024 1000         RADIOGRAPHY: CUP PACEART REMOTE DEVICE CHECK Result Date: 03/13/2024 PPM Scheduled remote reviewed. Normal device function.  Presenting rhythm:  AS-VP. 1 VHR, 6 beat NSVT. Next remote 91 days. - CS, CVRS     IMPRESSION/PLAN: Right Breast Axillary High  Grade Carcinoma of presumed breast Primary (Diagnosed in 2019), ER- / PR- / Her2-, High grade. S/p: right axillary lymph node dissection, radiation, and adjuvant chemotherapy now with skin metastases to the chest and now new suspicious signs in right axilla  It was a pleasure seeing this patient again today. She presents with biopsy confirmed skin metastases to the chest as well as highly suspicious right axilla skin nodules. Dr. Lurena Sally is recommending a course of palliative radiation to both areas to improve symptoms, and reduce the risk of further spread.    Today, we talked to the patient about the findings and work-up thus far.  We discussed the natural history of skin metastases and general treatment, highlighting the role of radiotherapy in the management.  We discussed the available radiation techniques, and focused on the details of logistics and delivery.  We reviewed the anticipated acute and late sequelae associated with radiation in this setting.  The patient was encouraged to ask questions that I answered to the best of my ability.  A patient consent form was discussed and signed.  We retained a copy for our records.  The patient would like to proceed with radiation and will be scheduled for CT simulation next week. Anticipate starting radiation approximately 1 week after her sim. Dr. Lurena Sally anticipates a treatment of 30 Gy in 10 fractions to the areas of concern.    On date of service, in total, we spent 40 minutes on this encounter. Patient was seen in person.   __________________________________________    Julio Ohm, PA-C   Colie Dawes, MD    [facility]  Radiation Oncology Direct Dial: [phone]  Fax: [phone] [email or web address]    This document serves as a record of services personally performed by Colie Dawes, MD and Julio Ohm, PA-C. It was created on her behalf by Lucky Sable, a trained medical scribe. The creation of this record is based on the scribe's personal  observations and the provider's statements to  them. This document has been checked and approved by the attending provider.

## 2024-04-04 ENCOUNTER — Ambulatory Visit
Admission: RE | Admit: 2024-04-04 | Discharge: 2024-04-04 | Disposition: A | Discharge status: Home / Self Care | Source: Ambulatory Visit | Attending: Radiation Oncology | Admitting: Radiation Oncology

## 2024-04-04 ENCOUNTER — Encounter: Payer: Self-pay | Admitting: Radiation Oncology

## 2024-04-04 ENCOUNTER — Ambulatory Visit

## 2024-04-04 VITALS — BP 145/80 | HR 92 | Temp 97.9°F | Resp 20 | Ht 66.0 in | Wt 210.6 lb

## 2024-04-04 DIAGNOSIS — Z79899 Other long term (current) drug therapy: Secondary | ICD-10-CM | POA: Diagnosis not present

## 2024-04-04 DIAGNOSIS — Z9221 Personal history of antineoplastic chemotherapy: Secondary | ICD-10-CM | POA: Diagnosis not present

## 2024-04-04 DIAGNOSIS — C50611 Malignant neoplasm of axillary tail of right female breast: Secondary | ICD-10-CM | POA: Insufficient documentation

## 2024-04-04 DIAGNOSIS — Z923 Personal history of irradiation: Secondary | ICD-10-CM | POA: Insufficient documentation

## 2024-04-04 DIAGNOSIS — G4730 Sleep apnea, unspecified: Secondary | ICD-10-CM | POA: Insufficient documentation

## 2024-04-04 DIAGNOSIS — C773 Secondary and unspecified malignant neoplasm of axilla and upper limb lymph nodes: Secondary | ICD-10-CM | POA: Insufficient documentation

## 2024-04-04 DIAGNOSIS — Z171 Estrogen receptor negative status [ER-]: Secondary | ICD-10-CM | POA: Diagnosis not present

## 2024-04-04 DIAGNOSIS — C771 Secondary and unspecified malignant neoplasm of intrathoracic lymph nodes: Secondary | ICD-10-CM

## 2024-04-04 DIAGNOSIS — C792 Secondary malignant neoplasm of skin: Secondary | ICD-10-CM | POA: Insufficient documentation

## 2024-04-06 ENCOUNTER — Inpatient Hospital Stay (HOSPITAL_BASED_OUTPATIENT_CLINIC_OR_DEPARTMENT_OTHER)

## 2024-04-06 ENCOUNTER — Other Ambulatory Visit: Payer: Self-pay | Admitting: Radiology

## 2024-04-06 VITALS — BP 147/81 | HR 103 | Temp 98.5°F | Resp 18

## 2024-04-06 DIAGNOSIS — Z923 Personal history of irradiation: Secondary | ICD-10-CM | POA: Diagnosis not present

## 2024-04-06 DIAGNOSIS — C50012 Malignant neoplasm of nipple and areola, left female breast: Secondary | ICD-10-CM | POA: Diagnosis present

## 2024-04-06 DIAGNOSIS — C778 Secondary and unspecified malignant neoplasm of lymph nodes of multiple regions: Secondary | ICD-10-CM | POA: Diagnosis not present

## 2024-04-06 DIAGNOSIS — Z9221 Personal history of antineoplastic chemotherapy: Secondary | ICD-10-CM | POA: Diagnosis not present

## 2024-04-06 DIAGNOSIS — Z801 Family history of malignant neoplasm of trachea, bronchus and lung: Secondary | ICD-10-CM | POA: Diagnosis not present

## 2024-04-06 DIAGNOSIS — J700 Acute pulmonary manifestations due to radiation: Secondary | ICD-10-CM | POA: Diagnosis not present

## 2024-04-06 DIAGNOSIS — Z171 Estrogen receptor negative status [ER-]: Secondary | ICD-10-CM | POA: Diagnosis not present

## 2024-04-06 DIAGNOSIS — Z5189 Encounter for other specified aftercare: Secondary | ICD-10-CM | POA: Diagnosis not present

## 2024-04-06 DIAGNOSIS — Y842 Radiological procedure and radiotherapy as the cause of abnormal reaction of the patient, or of later complication, without mention of misadventure at the time of the procedure: Secondary | ICD-10-CM | POA: Diagnosis not present

## 2024-04-06 DIAGNOSIS — C771 Secondary and unspecified malignant neoplasm of intrathoracic lymph nodes: Secondary | ICD-10-CM

## 2024-04-06 DIAGNOSIS — Z5112 Encounter for antineoplastic immunotherapy: Secondary | ICD-10-CM | POA: Diagnosis not present

## 2024-04-06 DIAGNOSIS — G4733 Obstructive sleep apnea (adult) (pediatric): Secondary | ICD-10-CM | POA: Diagnosis not present

## 2024-04-06 DIAGNOSIS — I10 Essential (primary) hypertension: Secondary | ICD-10-CM | POA: Diagnosis not present

## 2024-04-06 DIAGNOSIS — Z803 Family history of malignant neoplasm of breast: Secondary | ICD-10-CM | POA: Diagnosis not present

## 2024-04-06 MED ORDER — FILGRASTIM-SNDZ 480 MCG/0.8ML IJ SOSY
480.0000 ug | PREFILLED_SYRINGE | Freq: Once | INTRAMUSCULAR | Status: AC
  Administered 2024-04-06: 480 ug via SUBCUTANEOUS
  Filled 2024-04-06: qty 0.8

## 2024-04-06 NOTE — Progress Notes (Signed)
 TO BE COMPLETED BY RADIATION ONCOLOGIST OFFICE:   Patient Name: Jessica Simpson   Date of Birth: 11-13-1970   Radiation Oncologist: Dr. Lurena Sally   Site to be Treated: SKin of chest  Will x-rays >10 MV be used? No  Will the radiation be >10 cm from the device? No (verified 5 cm)  Planned Treatment Start Date:   TO BE COMPLETED BY CARDIOLOGIST OFFICE:   Device Information:  Pacemaker [x]      ICD []    Brand: Medtronic: [phone] Model #: Medtronic [id number] Belen Bowers DR MRI  Serial Number: [id number]     Date of Placement: 02/09/24  Site of Placement: Chest  Remote Device Check--Frequency: Every 91 days  Last Check: 03/11/24  Is the Patient Pacer Dependent?:  Yes [x]   No []   Does cardiologist request Radiation Oncology to schedule device testing by vendor for the following:  Prior to the Initiation of Treatments?  Yes []  No [x]  During Treatments?  Yes [x]  No []  Post Radiation Treatments?  Yes [x]  No []   Is device monitoring necessary by vendor/cardiologist team during treatments?  Yes []   No [x]   Is cardiac monitoring by Radiation Oncology nursing necessary during treatments? Yes [x]   No []   Do you recommend device be relocated prior to Radiation Treatment? Yes []   No [x]   **PLEASE LIST ANY NOTES OR SPECIAL REQUESTS:       CARDIOLOGIST SIGNATURE:  Dr. Manya Sells Per Device Clinic Standing Orders, Glorianne Largo  04/06/2024 12:24 PM  **Please route completed form back to Radiation Oncology Nursing and P [facility] RAD ONC ADMIN, OR send an update if there will be a delay in having form completed by expected start date.  **Call [phone] if you have any questions or do not get an in-basket response from a Radiation Oncology staff member

## 2024-04-07 ENCOUNTER — Encounter: Payer: Self-pay | Admitting: Hematology and Oncology

## 2024-04-09 ENCOUNTER — Ambulatory Visit: Admitting: Radiation Oncology

## 2024-04-09 ENCOUNTER — Ambulatory Visit (HOSPITAL_COMMUNITY)
Admission: RE | Admit: 2024-04-09 | Discharge: 2024-04-09 | Disposition: A | Discharge status: Home / Self Care | Source: Ambulatory Visit | Attending: Hematology and Oncology | Admitting: Hematology and Oncology

## 2024-04-09 ENCOUNTER — Other Ambulatory Visit

## 2024-04-09 ENCOUNTER — Inpatient Hospital Stay

## 2024-04-09 ENCOUNTER — Inpatient Hospital Stay (HOSPITAL_BASED_OUTPATIENT_CLINIC_OR_DEPARTMENT_OTHER): Admitting: Adult Health

## 2024-04-09 ENCOUNTER — Ambulatory Visit

## 2024-04-09 ENCOUNTER — Encounter: Payer: Self-pay | Admitting: Adult Health

## 2024-04-09 ENCOUNTER — Telehealth: Payer: Self-pay

## 2024-04-09 VITALS — HR 99

## 2024-04-09 VITALS — BP 130/77 | HR 103 | Temp 99.4°F | Resp 18 | Ht 66.0 in | Wt 212.4 lb

## 2024-04-09 DIAGNOSIS — C50012 Malignant neoplasm of nipple and areola, left female breast: Secondary | ICD-10-CM | POA: Diagnosis not present

## 2024-04-09 DIAGNOSIS — N6331 Unspecified lump in axillary tail of the right breast: Secondary | ICD-10-CM

## 2024-04-09 DIAGNOSIS — C50919 Malignant neoplasm of unspecified site of unspecified female breast: Secondary | ICD-10-CM | POA: Insufficient documentation

## 2024-04-09 DIAGNOSIS — C771 Secondary and unspecified malignant neoplasm of intrathoracic lymph nodes: Secondary | ICD-10-CM | POA: Insufficient documentation

## 2024-04-09 LAB — CBC WITH DIFFERENTIAL (CANCER CENTER ONLY)
Abs Immature Granulocytes: 0.21 10*3/uL — ABNORMAL HIGH (ref 0.00–0.07)
Basophils Absolute: 0 10*3/uL (ref 0.0–0.1)
Basophils Relative: 0 %
Eosinophils Absolute: 0 10*3/uL (ref 0.0–0.5)
Eosinophils Relative: 1 %
HCT: 29.3 % — ABNORMAL LOW (ref 36.0–46.0)
Hemoglobin: 9.7 g/dL — ABNORMAL LOW (ref 12.0–15.0)
Immature Granulocytes: 8 %
Lymphocytes Relative: 13 %
Lymphs Abs: 0.3 10*3/uL — ABNORMAL LOW (ref 0.7–4.0)
MCH: 30.2 pg (ref 26.0–34.0)
MCHC: 33.1 g/dL (ref 30.0–36.0)
MCV: 91.3 fL (ref 80.0–100.0)
Monocytes Absolute: 0.3 10*3/uL (ref 0.1–1.0)
Monocytes Relative: 10 %
Neutro Abs: 1.9 10*3/uL (ref 1.7–7.7)
Neutrophils Relative %: 68 %
Platelet Count: 88 10*3/uL — ABNORMAL LOW (ref 150–400)
RBC: 3.21 MIL/uL — ABNORMAL LOW (ref 3.87–5.11)
RDW: 17.4 % — ABNORMAL HIGH (ref 11.5–15.5)
WBC Count: 2.7 10*3/uL — ABNORMAL LOW (ref 4.0–10.5)
nRBC: 0 % (ref 0.0–0.2)

## 2024-04-09 LAB — CMP (CANCER CENTER ONLY)
ALT: 10 U/L (ref 0–44)
AST: 15 U/L (ref 15–41)
Albumin: 4.2 g/dL (ref 3.5–5.0)
Alkaline Phosphatase: 93 U/L (ref 38–126)
Anion gap: 8 (ref 5–15)
BUN: 20 mg/dL (ref 6–20)
CO2: 28 mmol/L (ref 22–32)
Calcium: 9.5 mg/dL (ref 8.9–10.3)
Chloride: 103 mmol/L (ref 98–111)
Creatinine: 0.95 mg/dL (ref 0.44–1.00)
GFR, Estimated: >60 mL/min (ref >60)
Glucose, Bld: 96 mg/dL (ref 70–99)
Potassium: 3.9 mmol/L (ref 3.5–5.1)
Sodium: 139 mmol/L (ref 135–145)
Total Bilirubin: 0.4 mg/dL (ref 0.0–1.2)
Total Protein: 7.4 g/dL (ref 6.5–8.1)

## 2024-04-09 LAB — MAGNESIUM: Magnesium: 1.8 mg/dL (ref 1.7–2.4)

## 2024-04-09 LAB — PHOSPHORUS: Phosphorus: 3.2 mg/dL (ref 2.5–4.6)

## 2024-04-09 MED ORDER — PALONOSETRON HCL INJECTION 0.25 MG/5ML
0.2500 mg | Freq: Once | INTRAVENOUS | Status: AC
  Administered 2024-04-09: 0.25 mg via INTRAVENOUS
  Filled 2024-04-09: qty 5

## 2024-04-09 MED ORDER — SODIUM CHLORIDE 0.9 % IV SOLN
150.0000 mg | Freq: Once | INTRAVENOUS | Status: AC
  Administered 2024-04-09: 150 mg via INTRAVENOUS
  Filled 2024-04-09: qty 150

## 2024-04-09 MED ORDER — SODIUM CHLORIDE 0.9 % IV SOLN
480.0000 mg | Freq: Once | INTRAVENOUS | Status: AC
  Administered 2024-04-09: 480 mg via INTRAVENOUS
  Filled 2024-04-09: qty 48

## 2024-04-09 MED ORDER — DIPHENHYDRAMINE HCL 50 MG/ML IJ SOLN
50.0000 mg | Freq: Once | INTRAMUSCULAR | Status: AC
  Administered 2024-04-09: 50 mg via INTRAVENOUS
  Filled 2024-04-09: qty 1

## 2024-04-09 MED ORDER — FAMOTIDINE IN NACL 20-0.9 MG/50ML-% IV SOLN
20.0000 mg | Freq: Once | INTRAVENOUS | Status: AC
  Administered 2024-04-09: 20 mg via INTRAVENOUS
  Filled 2024-04-09: qty 50

## 2024-04-09 MED ORDER — SODIUM CHLORIDE 0.9 % IV SOLN: Freq: Once | INTRAVENOUS | Status: AC

## 2024-04-09 MED ORDER — ACETAMINOPHEN 325 MG PO TABS
650.0000 mg | ORAL_TABLET | Freq: Once | ORAL | Status: AC
  Administered 2024-04-09: 650 mg via ORAL
  Filled 2024-04-09: qty 2

## 2024-04-09 MED ORDER — DEXAMETHASONE SODIUM PHOSPHATE 10 MG/ML IJ SOLN
10.0000 mg | Freq: Once | INTRAMUSCULAR | Status: AC
  Administered 2024-04-09: 10 mg via INTRAVENOUS
  Filled 2024-04-09: qty 1

## 2024-04-09 NOTE — Progress Notes (Signed)
 [facility] Cancer Follow up:    Serita Danes, MD [address] 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 KI-67 of 99%    - Pathologic: No stage assigned - Unsigned Specimen type: Core Needle Biopsy Histopathologic type: Infiltrating duct carcinoma, NOS Tumor size (mm): 66 Prognostic indicators: Triple negative. 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)  06/30/2011 Initial Diagnosis   Breast cancer, IDC, Left, Stage III, Triple negative, 7.6 cm breast mass and palpable axillary mass   09/06/2011 Miscellaneous   BRCA 1 and 2: Negative   09/14/2011 - 11/23/2011 Neo-Adjuvant Chemotherapy   Dose dense FEC followed by dose dense Taxotere    12/21/2011 Surgery   Left lumpectomy: High-grade poorly differentiated IDC 1.7 cm with high-grade DCIS, margins negative, 3/7 lymph nodes positive, ER 0%, PR 0%, HER-2 negative ratio 1.33 T1CN1 stage IIb   12/31/2011 - 02/15/2012 Radiation Therapy   Radiation at [facility]   09/26/2017 Relapse/Recurrence   Left breast  upper outer quadrant within the lumpectomy bed: Fibrosis no malignancy, right axillary lymph node biopsy metastatic high-grade carcinoma ER 0%, PR 0%, Ki-67 90%, HER-2 negative ratio 1.11 (similar to previous ductal carcinoma)   11/14/2017 - 03/06/2018 Neo-Adjuvant Chemotherapy   Neo-Adjuvant chemotherapy with Gemzar  and carboplatin  days 1 and 8 every 3 weeks   01/10/2018 Genetic Testing   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  The Hereditary Gene Panel offered by [facility] includes sequencing and/or deletion duplication testing of the following 47 genes: APC, ATM, AXIN2, BARD1, BMPR1A, BRCA1, BRCA2, BRIP1, CDH1, CDK4, CDKN2A (p14ARF), CDKN2A (p16INK4a), CHEK2, CTNNA1, DICER1, EPCAM (Deletion/duplication testing only), GREM1 (promoter region deletion/duplication testing only), KIT, MEN1, MLH1, MSH2, MSH3, MSH6, MUTYH, NBN, NF1, NHTL1, PALB2, PDGFRA, PMS2, POLD1, POLE, PTEN, RAD50, RAD51C, RAD51D, SDHB, SDHC, SDHD, SMAD4, SMARCA4. STK11, TP53, TSC1, TSC2, and VHL.  The following genes were evaluated for sequence changes only: SDHA and HOXB13 c.251G>A variant only. The report date is January 10, 2018.    05/31/2023 Procedure   Mediastinal mass biopsy: Metastatic poorly differentiated carcinoma compatible with breast primary ER +60%, PR 0%, HER2 0, Ki-67 40%    06/02/2023 PET scan   PET/CT Anterior mediastinal mass is hypermetabolic and centrally necrotic, soft tissue lesions anterior to the heart, 10 mm pleural-based lesion, level 2 cervical nodes, hypermetabolism hepatic dome mottled FDG accumulation throughout the skeletal system    06/06/2023 - 07/14/2023 Anti-estrogen oral therapy   Antiestrogen therapy with Verzenio  and letrozole     07/07/2023 Relapse/Recurrence   Increasing mediastinal mass 11.6 cm (used to be 10.8 cm) effacing the heart and involving the sternum, additional mediastinal/pericardial lymph  nodes increased right extra lymph node 1.5 cm, nodules 0.2  cm (used to be 0.8 cm), few new scattered lung nodules)   07/25/2023 -  Chemotherapy   Patient is on Treatment Plan : BREAST METASTATIC Sacituzumab govitecan -hziy (Trodelvy ) D1,8 q21d     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)  04/06/2018 Surgery   Right axillary lymph node dissection: 1/11 node positive for metastatic high-grade carcinoma   04/25/2018 Initial Diagnosis   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  History of Present Illness Jessica Simpson is a 53 year old female with metastatic breast cancer here for evaluation prior to receiving Trodelvy   She is undergoing evaluation for port placement, with an x-ray performed to confirm its location. A previous CT scan suggested possible placement in the brachiocephalic vein, necessitating further imaging. Confirmation of the port's position is pending to determine if adjustments are needed.  She experiences arm bone pain after receiving her Granix , which she associates with not taking Claritin  beforehand. She plans to resume injections in the abdomen and premedicate with Claritin . A recent reduction in treatment dose from 7.5mg /kg to 5mg /kg has improved her daily functioning.  She has increased coughing and dyspnea, particularly when outdoors in the heat, but it is not severe. She has been using over-the-counter Benadryl  instead of Claritin . She denies fever, chills, or any wheezing.  Recent labs show a  potassium level of 3.9, stable blood sugar, and normal kidney and liver function. Her white blood cell count is 2.7, higher than previously recorded. Hemoglobin is 9.7, and platelets are 88. Phosphorus levels are pending, and magnesium  is normal.  She was due to receive Keytruda today, however insurance denied this at 645 pm on Friday, 6/13.  Appeal is pending and unfortunately, Jessica Simpson will be unable to receive this today.     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   High degree atrioventricular block 11/15/2023   Secondary malignancy of mediastinal lymph nodes (HCC) 07/20/2023   PMB (postmenopausal bleeding) 01/26/2022   Papanicolaou smear of cervix with positive high risk human papilloma virus (HPV) test 12/25/2020   Routine medical exam 12/19/2020   Encounter for screening fecal occult blood testing 12/19/2020   Hypertension 09/01/2018   Screening for colorectal cancer 09/01/2018   Encounter for gynecological examination with Papanicolaou smear of cervix 09/01/2018   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) 04/25/2018   Genetic testing 01/12/2018   Family history of breast cancer    Port-A-Cath in place 11/21/2017   Mass of axillary tail of right breast 08/24/2017   Pain with urination 08/24/2017   Urinary frequency 08/24/2017   Hematuria 08/24/2017   Encounter for well woman exam with routine gynecological exam 01/15/2016   Benign cyst of right breast 11/29/2015   Positive fecal occult blood test 07/22/2014   History of breast cancer 07/22/2014   Rectal bleeding 07/10/2014  Lymphedema 06/15/2012   BRCA1 negative 10/22/2011   BRCA2 negative 10/22/2011   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  MEDICAL HISTORY: Past Medical History:  Diagnosis Date   BRCA1 negative 10/22/2011   BRCA2  negative 10/22/2011   Breast cancer, IDC, Left, Stage III, Triple negative 06/30/2011   Breast disorder    Contraceptive education 01/15/2016   Cough    Dyspnea    Family history of breast cancer    Fracture    right 4th toe   History of breast cancer 07/22/2014   History of radiation therapy 05/16/18- 06/22/18   Right Breast/ 50.4 Gy in 28 fractions. Right posterior axilla and SCV nodes/ 50.4 Gy in 28 fractions.    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 She then received a boost to the left lumpectomy of 10 gray in 5 fractions. This was given at [facility] .   Sleep apnea    Status post chemotherapy Comp. 11/23/11   FEC and Taxotere     SURGICAL HISTORY: Past Surgical History:  Procedure Laterality Date   anal ascess     turned into a fistula with extensive treatment   AXILLARY LYMPH NODE BIOPSY Right 04/06/2018   AXILLARY LYMPH NODE DISSECTION Right 04/06/2018   Procedure: RIGHT AXILLARY LYMPH NODE DISSECTION;  Surgeon: Enid Harry, MD;  Location: [facility] OR;  Service: General;  Laterality: Right;   BREAST BIOPSY Left 2012   BREAST BIOPSY Right 2019   BREAST LUMPECTOMY Left 2012   BREAST LUMPECTOMY Right 2019   rt axilla   BREAST SURGERY     CARDIAC ELECTROPHYSIOLOGY MAPPING AND ABLATION     CESAREAN SECTION     COLONOSCOPY N/A 08/14/2014   Procedure: COLONOSCOPY;  Surgeon: Ruby Corporal, MD;  Location: [facility];  Service: Endoscopy;  Laterality: N/A;  930    EVACUATION BREAST HEMATOMA  12/21/2011   Procedure: EVACUATION HEMATOMA BREAST;  Surgeon: Lockie Rima, MD;  Location: [facility] OR;  Service: General;  Laterality: Left;   HAND SURGERY     tumor on finger, left hand   INCISION AND DRAINAGE ABSCESS ANAL     x3   IR IMAGING GUIDED PORT INSERTION  08/01/2023   left breast biopsy with axillary biopsy     core bx   PORT-A-CATH REMOVAL  12/21/2011   Procedure: REMOVAL PORT-A-CATH;  Surgeon: Darcella Earnest, MD;  Location: [facility];  Service: General;  Laterality: N/A;   PORTACATH PLACEMENT  08/02/2011   dr Linell Rhymes   PORTACATH PLACEMENT Right 11/14/2017   Procedure: INSERTION PORT-A-CATH WITH US ;  Surgeon: Enid Harry, MD;  Location: [facility] OR;  Service: General;  Laterality: Right;   removal breast mass  2004   benign   TREATMENT FISTULA ANAL     TUMOR REMOVAL     on left hand   TUMOR REMOVAL  rt. eye    SOCIAL HISTORY: Social History   Socioeconomic History   Marital status: Single    Spouse name: Not on file   Number of children: Not on file   Years of education: Not on file   Highest education level: Not on file  Occupational History   Not on file  Tobacco Use   Smoking status: Never   Smokeless tobacco: Never  Vaping Use   Vaping status: Never Used  Substance and Sexual Activity   Alcohol use: No   Drug use: No   Sexual activity: Yes    Birth control/protection: Post-menopausal  Other Topics Concern   Not on file  Social History Narrative   Not on file   Social Drivers of Health   Financial Resource Strain: Low Risk  (01/26/2022)   Overall Financial Resource Strain (CARDIA)    Difficulty of Paying Living Expenses: Not very hard  Food Insecurity: No Food Insecurity (04/04/2024)   Hunger Vital Sign    Worried About Running Out of Food in the Last Year: Never true    Ran Out of Food in the Last Year: Never true  Transportation Needs: No Transportation Needs (04/04/2024)   PRAPARE - Therapist, art (Medical): No    Lack of Transportation (Non-Medical): No  Physical Activity: Insufficiently Active (01/26/2022)   Exercise Vital Sign    Days of Exercise per Week: 3 days    Minutes of Exercise per Session: 30 min  Stress: No Stress Concern Present (01/26/2022)   [employer] of Occupational Health - Occupational Stress Questionnaire    Feeling of Stress : Only a little  Social Connections: Moderately Integrated (01/26/2022)   Social Connection and Isolation Panel    Frequency of Communication with Friends and Family: More than three times a week    Frequency of Social Gatherings with Friends and Family: Once a week    Attends Religious Services: More than 4 times per year    Active Member of [employer] or Organizations: Yes    Attends Engineer, structural: More than 4 times per year    Marital Status: Never married  Intimate Partner Violence: Not At Risk (04/04/2024)   Humiliation, Afraid, Rape, and Kick questionnaire    Fear of Current or Ex-Partner: No    Emotionally Abused: No    Physically Abused: No    Sexually Abused: No    FAMILY HISTORY: Family History  Problem Relation Age of Onset   Cancer Father 32       lung cancer    Cancer Maternal Grandmother        breast   Cancer Maternal Grandfather    Breast cancer Other        MGMs mother   Colon cancer Neg Hx     Review of Systems  Constitutional:  Negative for appetite change, chills, fatigue, fever and unexpected weight change.  HENT:   Negative for hearing loss, lump/mass and trouble swallowing.   Eyes:  Negative for eye problems and icterus.  Respiratory:  Negative for chest tightness, cough and shortness of breath.   Cardiovascular:  Negative for chest pain, leg swelling and palpitations.  Gastrointestinal:  Negative for abdominal distention, abdominal pain, constipation, diarrhea, nausea and vomiting.  Endocrine: Negative for hot flashes.  Genitourinary:  Negative for difficulty  urinating.   Musculoskeletal:  Negative for arthralgias.  Skin:  Negative for itching and rash.  Neurological:  Negative for dizziness, extremity weakness, headaches and  numbness.  Hematological:  Negative for adenopathy. Does not bruise/bleed easily.  Psychiatric/Behavioral:  Negative for depression. The patient is not nervous/anxious.       PHYSICAL EXAMINATION    Vitals:   04/09/24 1036  BP: 130/77  Pulse: (!) 103  Resp: 18  Temp: 99.4 F (37.4 C)  SpO2: 100%    Physical Exam Constitutional:      General: She is not in acute distress.    Appearance: Normal appearance. She is not toxic-appearing.  HENT:     Head: Normocephalic and atraumatic.     Mouth/Throat:     Mouth: Mucous membranes are moist.     Pharynx: Oropharynx is clear. No oropharyngeal exudate or posterior oropharyngeal erythema.   Eyes:     General: No scleral icterus.   Cardiovascular:     Rate and Rhythm: Normal rate and regular rhythm.     Pulses: Normal pulses.     Heart sounds: Normal heart sounds.  Pulmonary:     Effort: Pulmonary effort is normal.     Breath sounds: Normal breath sounds.  Abdominal:     General: Abdomen is flat. Bowel sounds are normal. There is no distension.     Palpations: Abdomen is soft.     Tenderness: There is no abdominal tenderness.   Musculoskeletal:        General: No swelling.     Cervical back: Neck supple.  Lymphadenopathy:     Cervical: No cervical adenopathy.   Skin:    General: Skin is warm and dry.     Findings: No rash.   Neurological:     General: No focal deficit present.     Mental Status: She is alert.   Psychiatric:        Mood and Affect: Mood normal.        Behavior: Behavior normal.     LABORATORY DATA:  CBC    Component Value Date/Time   WBC 2.7 (L) 04/09/2024 0956   WBC 5.1 05/31/2023 0831   RBC 3.21 (L) 04/09/2024 0956   HGB 9.7 (L) 04/09/2024 0956   HGB 12.2 07/26/2016 1650   HCT 29.3 (L) 04/09/2024 0956   HCT 37.5  07/26/2016 1650   PLT 88 (L) 04/09/2024 0956   PLT 294 07/26/2016 1650   MCV 91.3 04/09/2024 0956   MCV 80 07/26/2016 1650   MCH 30.2 04/09/2024 0956   MCHC 33.1 04/09/2024 0956   RDW 17.4 (H) 04/09/2024 0956   RDW 14.0 07/26/2016 1650   LYMPHSABS 0.3 (L) 04/09/2024 0956   MONOABS 0.3 04/09/2024 0956   EOSABS 0.0 04/09/2024 0956   BASOSABS 0.0 04/09/2024 0956    CMP     Component Value Date/Time   NA 139 04/09/2024 0956   NA 140 07/26/2016 1650   K 3.9 04/09/2024 0956   CL 103 04/09/2024 0956   CO2 28 04/09/2024 0956   GLUCOSE 96 04/09/2024 0956   BUN 20 04/09/2024 0956   BUN 13 07/26/2016 1650   CREATININE 0.95 04/09/2024 0956   CALCIUM 9.5 04/09/2024 0956   PROT 7.4 04/09/2024 0956   PROT 7.4 07/26/2016 1650   ALBUMIN 4.2 04/09/2024 0956   ALBUMIN 4.2 07/26/2016 1650   AST 15 04/09/2024 0956   ALT 10 04/09/2024 0956   ALKPHOS 93 04/09/2024 0956   BILITOT 0.4 04/09/2024 0956   GFRNONAA >60 04/09/2024 0956   GFRAA >60 12/21/2018 1402   GFRAA >60 10/24/2018 1053     ASSESSMENT and THERAPY PLAN:  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) 06/30/2011: Left breast cancer stage III triple negative, 7.6 cm 09/14/2011: Dose dense FEC followed by dose dense Taxotere  neoadjuvant chemo 12/21/2011: Left lumpectomy: High-grade poorly differentiated IDC 1.7 cm 3/7 nodes positive triple negative, XRT 09/26/2017: Relapse: Right axillary lymph node biopsy positive for metastatic high-grade carcinoma triple negative 11/14/2017: Neoadjuvant Gemzar  and carboplatin  05/31/2023: Relapse: Mediastinal mass biopsy: Metastatic poorly differentiated carcinoma ER 60%, HER2 negative 06/06/2023: Verzinio with letrozole  07/25/2023: Trodelvy  02/21/2024: Chest wall skin biopsy: Poorly differentiated carcinoma ER 0% PR 0% Ki-67 40%, HER2 0 ------------------------------------------------------------------------------------------------------------------------------------ Patient was  referred for second opinion to [facility] molecular testing: PD-L1 mutation: Assessment and Plan Assessment & Plan Cancer treatment with Trodelvy  and Keytruda Currently receiving Trodelvy  with improved tolerance after dose reduction. Keytruda administration is pending due to insurance denial; an appeal is in process. [facility] testing shows PDL1 positivity, indicating potential benefit from Keytruda. X-ray confirmation of port placement is pending, with possible use of the arm for treatment if necessary. - Continue Trodelvy  treatment-labs reviewed and are within parameters. - Await insurance approval for Keytruda and adjust treatment schedule accordingly. - Manage side effects from growth factor injection with  Claritin  as needed. - Verify port placement with x-ray and adjust treatment approach if necessary.  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 No fever or chills, so antibiotics are not indicated. - Monitor for new or worsening symptoms such as fever or chills. - Initiate antibiotics if new or worsening symptoms develop.   RTC once Keytruda approved for treatment and on 05/01/2024 for labs, f/u with Dr. Lee Public and her next cycle of Trodelvy .    All questions were answered. The patient knows to call the clinic with any problems, questions or concerns. We can certainly see the patient much sooner if necessary.  Total encounter time:30 minutes*in face-to-face visit time, chart review, lab review, care coordination, order entry, and documentation of the encounter time.    Alwin Baars, NP 04/09/24 12:37 PM Medical Oncology and Hematology [facility] [address] [place], [place] [address] Tel. [phone]    Fax. [phone]  *Total Encounter Time as defined by the Centers for Medicare and Medicaid Services includes, in addition to the face-to-face time of a patient visit (documented in the note above) non-face-to-face time:  obtaining and reviewing outside history, ordering and reviewing medications, tests or procedures, care coordination (communications with other health care professionals or caregivers) and documentation in the medical record.

## 2024-04-09 NOTE — Telephone Encounter (Signed)
 S/w pt this morning regarding this concern. She received a call from  [employer] 04/06/24 at 1844 lettingher know that her new tx plan has been denied. Our office also did not receive notification of this until the weekend. Plan entered 04/03/24 and pt was scheduled for tx 04/09/24.

## 2024-04-09 NOTE — Assessment & Plan Note (Signed)
 06/30/2011: Left breast cancer stage III triple negative, 7.6 cm 09/14/2011: Dose dense FEC followed by dose dense Taxotere  neoadjuvant chemo 12/21/2011: Left lumpectomy: High-grade poorly differentiated IDC 1.7 cm 3/7 nodes positive triple negative, XRT 09/26/2017: Relapse: Right axillary lymph node biopsy positive for metastatic high-grade carcinoma triple negative 11/14/2017: Neoadjuvant Gemzar  and carboplatin  05/31/2023: Relapse: Mediastinal mass biopsy: Metastatic poorly differentiated carcinoma ER 60%, HER2 negative 06/06/2023: Verzinio with letrozole  07/25/2023: Trodelvy  02/21/2024: Chest wall skin biopsy: Poorly differentiated carcinoma ER 0% PR 0% Ki-67 40%, HER2 0 ------------------------------------------------------------------------------------------------------------------------------------ Patient was referred for second opinion to [facility] molecular testing: PD-L1 mutation:

## 2024-04-09 NOTE — Patient Instructions (Signed)
 [facility]  Discharge Instructions: Thank you for choosing [facility] to provide your oncology and hematology care.   If you have a lab appointment with the Cancer Center, please go directly to the Cancer Center and check in at the registration area.   Wear comfortable clothing and clothing appropriate for easy access to any Portacath or PICC line.   We strive to give you quality time with your provider. You may need to reschedule your appointment if you arrive late (15 or more minutes).  Arriving late affects you and other patients whose appointments are after yours.  Also, if you miss three or more appointments without notifying the office, you may be dismissed from the clinic at the provider's discretion.      For prescription refill requests, have your pharmacy contact our office and allow 72 hours for refills to be completed.    Today you received the following chemotherapy and/or immunotherapy agents: [name]      To help prevent nausea and vomiting after your treatment, we encourage you to take your nausea medication as directed.  BELOW ARE SYMPTOMS THAT SHOULD BE REPORTED IMMEDIATELY: *FEVER GREATER THAN 100.4 F (38 C) OR HIGHER *CHILLS OR SWEATING *NAUSEA AND VOMITING THAT IS NOT CONTROLLED WITH YOUR NAUSEA MEDICATION *UNUSUAL SHORTNESS OF BREATH *UNUSUAL BRUISING OR BLEEDING *URINARY PROBLEMS (pain or burning when urinating, or frequent urination) *BOWEL PROBLEMS (unusual diarrhea, constipation, pain near the anus) TENDERNESS IN MOUTH AND THROAT WITH OR WITHOUT PRESENCE OF ULCERS (sore throat, sores in mouth, or a toothache) UNUSUAL RASH, SWELLING OR PAIN  UNUSUAL VAGINAL DISCHARGE OR ITCHING   Items with * indicate a potential emergency and should be followed up as soon as possible or go to the Emergency Department if any problems should occur.  Please show the CHEMOTHERAPY ALERT CARD or IMMUNOTHERAPY  ALERT CARD at check-in to the Emergency Department and triage nurse.  Should you have questions after your visit or need to cancel or reschedule your appointment, please contact [facility]  Dept: [phone]  and follow the prompts.  Office hours are 8:00 a.m. to 4:30 p.m. Monday - Friday. Please note that voicemails left after 4:00 p.m. may not be returned until the following business day.  We are closed weekends and major holidays. You have access to a nurse at all times for urgent questions. Please call the main number to the clinic Dept: [phone] and follow the prompts.   For any non-urgent questions, you may also contact your provider using MyChart. We now offer e-Visits for anyone 66 and older to request care online for non-urgent symptoms. For details visit [email or web address].   Also download the MyChart app! Go to the app store, search "MyChart", open the app, select [place], and log in with your MyChart username and password.

## 2024-04-10 ENCOUNTER — Encounter: Payer: Self-pay | Admitting: Hematology and Oncology

## 2024-04-11 ENCOUNTER — Ambulatory Visit
Admission: RE | Admit: 2024-04-11 | Discharge: 2024-04-11 | Disposition: A | Discharge status: Home / Self Care | Source: Ambulatory Visit | Attending: Radiation Oncology | Admitting: Radiation Oncology

## 2024-04-11 DIAGNOSIS — Z510 Encounter for antineoplastic radiation therapy: Secondary | ICD-10-CM | POA: Diagnosis not present

## 2024-04-11 DIAGNOSIS — Z171 Estrogen receptor negative status [ER-]: Secondary | ICD-10-CM | POA: Insufficient documentation

## 2024-04-11 DIAGNOSIS — C50611 Malignant neoplasm of axillary tail of right female breast: Secondary | ICD-10-CM | POA: Insufficient documentation

## 2024-04-11 DIAGNOSIS — C771 Secondary and unspecified malignant neoplasm of intrathoracic lymph nodes: Secondary | ICD-10-CM | POA: Insufficient documentation

## 2024-04-11 DIAGNOSIS — C792 Secondary malignant neoplasm of skin: Secondary | ICD-10-CM | POA: Diagnosis present

## 2024-04-13 ENCOUNTER — Inpatient Hospital Stay

## 2024-04-16 ENCOUNTER — Other Ambulatory Visit: Payer: Self-pay

## 2024-04-16 ENCOUNTER — Ambulatory Visit
Admission: RE | Admit: 2024-04-16 | Discharge: 2024-04-16 | Disposition: A | Discharge status: Home / Self Care | Source: Ambulatory Visit | Attending: Radiation Oncology | Admitting: Radiation Oncology

## 2024-04-16 ENCOUNTER — Inpatient Hospital Stay

## 2024-04-16 ENCOUNTER — Encounter: Payer: Self-pay | Admitting: Hematology and Oncology

## 2024-04-16 ENCOUNTER — Other Ambulatory Visit: Payer: Self-pay | Admitting: Pharmacist

## 2024-04-16 DIAGNOSIS — C50919 Malignant neoplasm of unspecified site of unspecified female breast: Secondary | ICD-10-CM

## 2024-04-16 DIAGNOSIS — N6331 Unspecified lump in axillary tail of the right breast: Secondary | ICD-10-CM

## 2024-04-16 DIAGNOSIS — C792 Secondary malignant neoplasm of skin: Secondary | ICD-10-CM | POA: Diagnosis not present

## 2024-04-16 LAB — RAD ONC ARIA SESSION SUMMARY

## 2024-04-17 ENCOUNTER — Ambulatory Visit
Admission: RE | Admit: 2024-04-17 | Discharge: 2024-04-17 | Disposition: A | Discharge status: Home / Self Care | Source: Ambulatory Visit | Attending: Radiation Oncology | Admitting: Radiation Oncology

## 2024-04-17 ENCOUNTER — Other Ambulatory Visit: Payer: Self-pay

## 2024-04-17 DIAGNOSIS — C792 Secondary malignant neoplasm of skin: Secondary | ICD-10-CM | POA: Diagnosis not present

## 2024-04-17 LAB — RAD ONC ARIA SESSION SUMMARY

## 2024-04-18 ENCOUNTER — Other Ambulatory Visit: Payer: Self-pay

## 2024-04-18 ENCOUNTER — Ambulatory Visit
Admission: RE | Admit: 2024-04-18 | Discharge: 2024-04-18 | Disposition: A | Discharge status: Home / Self Care | Source: Ambulatory Visit | Attending: Radiation Oncology | Admitting: Radiation Oncology

## 2024-04-18 ENCOUNTER — Other Ambulatory Visit: Payer: Self-pay | Admitting: Adult Health

## 2024-04-18 DIAGNOSIS — C792 Secondary malignant neoplasm of skin: Secondary | ICD-10-CM | POA: Diagnosis not present

## 2024-04-18 LAB — RAD ONC ARIA SESSION SUMMARY

## 2024-04-19 ENCOUNTER — Ambulatory Visit
Admission: RE | Admit: 2024-04-19 | Discharge: 2024-04-19 | Disposition: A | Discharge status: Home / Self Care | Source: Ambulatory Visit | Attending: Radiation Oncology | Admitting: Radiation Oncology

## 2024-04-19 ENCOUNTER — Other Ambulatory Visit: Payer: Self-pay

## 2024-04-19 ENCOUNTER — Other Ambulatory Visit: Payer: Self-pay | Admitting: Hematology and Oncology

## 2024-04-19 DIAGNOSIS — C792 Secondary malignant neoplasm of skin: Secondary | ICD-10-CM | POA: Diagnosis not present

## 2024-04-19 LAB — RAD ONC ARIA SESSION SUMMARY

## 2024-04-20 ENCOUNTER — Other Ambulatory Visit: Payer: Self-pay | Admitting: Hematology and Oncology

## 2024-04-20 ENCOUNTER — Other Ambulatory Visit: Payer: Self-pay

## 2024-04-20 ENCOUNTER — Ambulatory Visit
Admission: RE | Admit: 2024-04-20 | Discharge: 2024-04-20 | Disposition: A | Discharge status: Home / Self Care | Source: Ambulatory Visit | Attending: Radiation Oncology

## 2024-04-20 DIAGNOSIS — C792 Secondary malignant neoplasm of skin: Secondary | ICD-10-CM | POA: Diagnosis not present

## 2024-04-20 LAB — RAD ONC ARIA SESSION SUMMARY

## 2024-04-23 ENCOUNTER — Ambulatory Visit
Admission: RE | Admit: 2024-04-23 | Discharge: 2024-04-23 | Disposition: A | Discharge status: Home / Self Care | Source: Ambulatory Visit | Attending: Radiation Oncology | Admitting: Radiation Oncology

## 2024-04-23 ENCOUNTER — Other Ambulatory Visit: Payer: Self-pay

## 2024-04-23 DIAGNOSIS — C792 Secondary malignant neoplasm of skin: Secondary | ICD-10-CM | POA: Diagnosis not present

## 2024-04-23 LAB — RAD ONC ARIA SESSION SUMMARY

## 2024-04-24 ENCOUNTER — Ambulatory Visit
Admission: RE | Admit: 2024-04-24 | Discharge: 2024-04-24 | Disposition: A | Discharge status: Home / Self Care | Source: Ambulatory Visit | Attending: Radiation Oncology | Admitting: Radiation Oncology

## 2024-04-24 ENCOUNTER — Other Ambulatory Visit: Payer: Self-pay

## 2024-04-24 DIAGNOSIS — Z171 Estrogen receptor negative status [ER-]: Secondary | ICD-10-CM | POA: Diagnosis not present

## 2024-04-24 DIAGNOSIS — C50611 Malignant neoplasm of axillary tail of right female breast: Secondary | ICD-10-CM | POA: Insufficient documentation

## 2024-04-24 DIAGNOSIS — Z510 Encounter for antineoplastic radiation therapy: Secondary | ICD-10-CM | POA: Diagnosis not present

## 2024-04-24 DIAGNOSIS — C771 Secondary and unspecified malignant neoplasm of intrathoracic lymph nodes: Secondary | ICD-10-CM | POA: Insufficient documentation

## 2024-04-24 DIAGNOSIS — C792 Secondary malignant neoplasm of skin: Secondary | ICD-10-CM | POA: Diagnosis present

## 2024-04-24 LAB — RAD ONC ARIA SESSION SUMMARY

## 2024-04-25 ENCOUNTER — Other Ambulatory Visit: Payer: Self-pay

## 2024-04-25 ENCOUNTER — Ambulatory Visit
Admission: RE | Admit: 2024-04-25 | Discharge: 2024-04-25 | Disposition: A | Discharge status: Home / Self Care | Source: Ambulatory Visit | Attending: Radiation Oncology | Admitting: Radiation Oncology

## 2024-04-25 DIAGNOSIS — C792 Secondary malignant neoplasm of skin: Secondary | ICD-10-CM | POA: Diagnosis not present

## 2024-04-25 LAB — RAD ONC ARIA SESSION SUMMARY
Course Elapsed Days: 9
Plan Fractions Treated to Date: 8
Plan Fractions Treated to Date: 8
Plan Prescribed Dose Per Fraction: 3 Gy
Plan Prescribed Dose Per Fraction: 3 Gy
Plan Total Fractions Prescribed: 10
Plan Total Fractions Prescribed: 10
Plan Total Prescribed Dose: 30 Gy
Plan Total Prescribed Dose: 30 Gy
Reference Point Dosage Given to Date: 24 Gy
Reference Point Dosage Given to Date: 24 Gy
Reference Point Session Dosage Given: 3 Gy
Reference Point Session Dosage Given: 3 Gy
Session Number: 8

## 2024-04-26 ENCOUNTER — Ambulatory Visit
Admission: RE | Admit: 2024-04-26 | Discharge: 2024-04-26 | Disposition: A | Discharge status: Home / Self Care | Source: Ambulatory Visit | Attending: Radiation Oncology | Admitting: Radiation Oncology

## 2024-04-26 ENCOUNTER — Encounter: Payer: Self-pay | Admitting: Hematology and Oncology

## 2024-04-26 ENCOUNTER — Other Ambulatory Visit: Payer: Self-pay

## 2024-04-26 DIAGNOSIS — C792 Secondary malignant neoplasm of skin: Secondary | ICD-10-CM | POA: Diagnosis not present

## 2024-04-26 LAB — RAD ONC ARIA SESSION SUMMARY
Course Elapsed Days: 10
Plan Fractions Treated to Date: 9
Plan Fractions Treated to Date: 9
Plan Prescribed Dose Per Fraction: 3 Gy
Plan Prescribed Dose Per Fraction: 3 Gy
Plan Total Fractions Prescribed: 10
Plan Total Fractions Prescribed: 10
Plan Total Prescribed Dose: 30 Gy
Plan Total Prescribed Dose: 30 Gy
Reference Point Dosage Given to Date: 27 Gy
Reference Point Dosage Given to Date: 27 Gy
Reference Point Session Dosage Given: 3 Gy
Reference Point Session Dosage Given: 3 Gy
Session Number: 9

## 2024-04-28 ENCOUNTER — Inpatient Hospital Stay

## 2024-04-28 ENCOUNTER — Inpatient Hospital Stay: Attending: Hematology and Oncology

## 2024-04-28 VITALS — BP 145/82 | HR 100 | Temp 98.2°F | Resp 16

## 2024-04-28 DIAGNOSIS — Z9221 Personal history of antineoplastic chemotherapy: Secondary | ICD-10-CM | POA: Diagnosis not present

## 2024-04-28 DIAGNOSIS — Z5189 Encounter for other specified aftercare: Secondary | ICD-10-CM | POA: Diagnosis not present

## 2024-04-28 DIAGNOSIS — C792 Secondary malignant neoplasm of skin: Secondary | ICD-10-CM | POA: Diagnosis not present

## 2024-04-28 DIAGNOSIS — C778 Secondary and unspecified malignant neoplasm of lymph nodes of multiple regions: Secondary | ICD-10-CM | POA: Diagnosis not present

## 2024-04-28 DIAGNOSIS — Z1721 Progesterone receptor positive status: Secondary | ICD-10-CM | POA: Insufficient documentation

## 2024-04-28 DIAGNOSIS — D696 Thrombocytopenia, unspecified: Secondary | ICD-10-CM | POA: Insufficient documentation

## 2024-04-28 DIAGNOSIS — Z923 Personal history of irradiation: Secondary | ICD-10-CM | POA: Diagnosis not present

## 2024-04-28 DIAGNOSIS — D6481 Anemia due to antineoplastic chemotherapy: Secondary | ICD-10-CM | POA: Insufficient documentation

## 2024-04-28 DIAGNOSIS — R0602 Shortness of breath: Secondary | ICD-10-CM | POA: Insufficient documentation

## 2024-04-28 DIAGNOSIS — Z171 Estrogen receptor negative status [ER-]: Secondary | ICD-10-CM | POA: Insufficient documentation

## 2024-04-28 DIAGNOSIS — Z5112 Encounter for antineoplastic immunotherapy: Secondary | ICD-10-CM | POA: Diagnosis not present

## 2024-04-28 DIAGNOSIS — T451X5A Adverse effect of antineoplastic and immunosuppressive drugs, initial encounter: Secondary | ICD-10-CM | POA: Insufficient documentation

## 2024-04-28 DIAGNOSIS — R059 Cough, unspecified: Secondary | ICD-10-CM | POA: Insufficient documentation

## 2024-04-28 DIAGNOSIS — N6331 Unspecified lump in axillary tail of the right breast: Secondary | ICD-10-CM

## 2024-04-28 DIAGNOSIS — D709 Neutropenia, unspecified: Secondary | ICD-10-CM | POA: Insufficient documentation

## 2024-04-28 DIAGNOSIS — C50012 Malignant neoplasm of nipple and areola, left female breast: Secondary | ICD-10-CM | POA: Diagnosis present

## 2024-04-28 DIAGNOSIS — C50919 Malignant neoplasm of unspecified site of unspecified female breast: Secondary | ICD-10-CM

## 2024-04-28 DIAGNOSIS — C7800 Secondary malignant neoplasm of unspecified lung: Secondary | ICD-10-CM | POA: Insufficient documentation

## 2024-04-28 DIAGNOSIS — Z79899 Other long term (current) drug therapy: Secondary | ICD-10-CM | POA: Diagnosis not present

## 2024-04-28 MED ORDER — FILGRASTIM-SNDZ 480 MCG/0.8ML IJ SOSY
480.0000 ug | PREFILLED_SYRINGE | Freq: Once | INTRAMUSCULAR | Status: AC
  Administered 2024-04-28: 480 ug via SUBCUTANEOUS
  Filled 2024-04-28: qty 0.8

## 2024-04-30 ENCOUNTER — Ambulatory Visit

## 2024-04-30 ENCOUNTER — Other Ambulatory Visit: Payer: Self-pay

## 2024-04-30 ENCOUNTER — Ambulatory Visit: Admitting: Hematology and Oncology

## 2024-04-30 ENCOUNTER — Telehealth: Payer: Self-pay

## 2024-04-30 ENCOUNTER — Encounter: Payer: Self-pay | Admitting: Hematology and Oncology

## 2024-04-30 ENCOUNTER — Ambulatory Visit
Admission: RE | Admit: 2024-04-30 | Discharge: 2024-04-30 | Disposition: A | Discharge status: Home / Self Care | Source: Ambulatory Visit | Attending: Radiation Oncology | Admitting: Radiation Oncology

## 2024-04-30 ENCOUNTER — Other Ambulatory Visit

## 2024-04-30 DIAGNOSIS — C792 Secondary malignant neoplasm of skin: Secondary | ICD-10-CM | POA: Diagnosis not present

## 2024-04-30 LAB — RAD ONC ARIA SESSION SUMMARY
Course Elapsed Days: 14
Plan Fractions Treated to Date: 10
Plan Fractions Treated to Date: 10
Plan Prescribed Dose Per Fraction: 3 Gy
Plan Prescribed Dose Per Fraction: 3 Gy
Plan Total Fractions Prescribed: 10
Plan Total Fractions Prescribed: 10
Plan Total Prescribed Dose: 30 Gy
Plan Total Prescribed Dose: 30 Gy
Reference Point Dosage Given to Date: 30 Gy
Reference Point Dosage Given to Date: 30 Gy
Reference Point Session Dosage Given: 3 Gy
Reference Point Session Dosage Given: 3 Gy
Session Number: 10

## 2024-04-30 MED FILL — Fosaprepitant Dimeglumine For IV Infusion 150 MG (Base Eq): INTRAVENOUS | Qty: 5 | Status: AC

## 2024-04-30 NOTE — Assessment & Plan Note (Signed)
 06/30/2011: Left breast cancer stage III triple negative, 7.6 cm 09/14/2011: Dose dense FEC followed by dose dense Taxotere  neoadjuvant chemo 12/21/2011: Left lumpectomy: High-grade poorly differentiated IDC 1.7 cm 3/7 nodes positive triple negative, XRT 09/26/2017: Relapse: Right axillary lymph node biopsy positive for metastatic high-grade carcinoma triple negative 11/14/2017: Neoadjuvant Gemzar  and carboplatin  05/31/2023: Relapse: Mediastinal mass biopsy: Metastatic poorly differentiated carcinoma ER 60%, HER2 negative 06/06/2023: Verzinio with letrozole  07/25/2023: Trodelvy  02/21/2024: Chest wall skin biopsy: Poorly differentiated carcinoma ER 0% PR 0% Ki-67 40%, HER2 0 --------------------------------------------------------------------------------------------------------------------- [facility] molecular testing: PD-L1 mutation:  Current Treatment: Trodelvy  and Keytruda  Toxicities:

## 2024-04-30 NOTE — Telephone Encounter (Signed)
 Pt called the office wanting to know how she can obtain a copy of her records that were sent to her disability. I let her know that she needed to an ROI  and to fill in the dates that she is requesting. I was able to email her an RO for her to sign. Pt verbalized understanding. No questions  or concerns to be noted at this time.

## 2024-05-01 ENCOUNTER — Inpatient Hospital Stay (HOSPITAL_BASED_OUTPATIENT_CLINIC_OR_DEPARTMENT_OTHER): Admitting: Hematology and Oncology

## 2024-05-01 ENCOUNTER — Encounter: Payer: Self-pay | Admitting: Hematology and Oncology

## 2024-05-01 ENCOUNTER — Inpatient Hospital Stay

## 2024-05-01 VITALS — BP 134/97 | HR 98 | Temp 97.8°F | Resp 17 | Ht 66.0 in | Wt 212.4 lb

## 2024-05-01 DIAGNOSIS — C50919 Malignant neoplasm of unspecified site of unspecified female breast: Secondary | ICD-10-CM | POA: Diagnosis not present

## 2024-05-01 DIAGNOSIS — N6331 Unspecified lump in axillary tail of the right breast: Secondary | ICD-10-CM

## 2024-05-01 DIAGNOSIS — C50012 Malignant neoplasm of nipple and areola, left female breast: Secondary | ICD-10-CM | POA: Diagnosis not present

## 2024-05-01 DIAGNOSIS — C771 Secondary and unspecified malignant neoplasm of intrathoracic lymph nodes: Secondary | ICD-10-CM | POA: Diagnosis not present

## 2024-05-01 DIAGNOSIS — Z95828 Presence of other vascular implants and grafts: Secondary | ICD-10-CM

## 2024-05-01 LAB — CBC WITH DIFFERENTIAL (CANCER CENTER ONLY)
Abs Immature Granulocytes: 0.08 K/uL — ABNORMAL HIGH (ref 0.00–0.07)
Basophils Absolute: 0 K/uL (ref 0.0–0.1)
Basophils Relative: 1 %
Eosinophils Absolute: 0 K/uL (ref 0.0–0.5)
Eosinophils Relative: 1 %
HCT: 26.5 % — ABNORMAL LOW (ref 36.0–46.0)
Hemoglobin: 8.8 g/dL — ABNORMAL LOW (ref 12.0–15.0)
Immature Granulocytes: 3 %
Lymphocytes Relative: 10 %
Lymphs Abs: 0.3 K/uL — ABNORMAL LOW (ref 0.7–4.0)
MCH: 30.6 pg (ref 26.0–34.0)
MCHC: 33.2 g/dL (ref 30.0–36.0)
MCV: 92 fL (ref 80.0–100.0)
Monocytes Absolute: 0.3 K/uL (ref 0.1–1.0)
Monocytes Relative: 11 %
Neutro Abs: 2.2 K/uL (ref 1.7–7.7)
Neutrophils Relative %: 74 %
Platelet Count: 87 K/uL — ABNORMAL LOW (ref 150–400)
RBC: 2.88 MIL/uL — ABNORMAL LOW (ref 3.87–5.11)
RDW: 16.6 % — ABNORMAL HIGH (ref 11.5–15.5)
WBC Count: 3 K/uL — ABNORMAL LOW (ref 4.0–10.5)
nRBC: 0 % (ref 0.0–0.2)

## 2024-05-01 LAB — CMP (CANCER CENTER ONLY)
ALT: 9 U/L (ref 0–44)
AST: 14 U/L — ABNORMAL LOW (ref 15–41)
Albumin: 3.9 g/dL (ref 3.5–5.0)
Alkaline Phosphatase: 100 U/L (ref 38–126)
Anion gap: 8 (ref 5–15)
BUN: 20 mg/dL (ref 6–20)
CO2: 28 mmol/L (ref 22–32)
Calcium: 9.4 mg/dL (ref 8.9–10.3)
Chloride: 102 mmol/L (ref 98–111)
Creatinine: 0.99 mg/dL (ref 0.44–1.00)
GFR, Estimated: >60 mL/min (ref >60)
Glucose, Bld: 101 mg/dL — ABNORMAL HIGH (ref 70–99)
Potassium: 3.5 mmol/L (ref 3.5–5.1)
Sodium: 138 mmol/L (ref 135–145)
Total Bilirubin: 0.4 mg/dL (ref 0.0–1.2)
Total Protein: 7.2 g/dL (ref 6.5–8.1)

## 2024-05-01 LAB — MAGNESIUM: Magnesium: 1.8 mg/dL (ref 1.7–2.4)

## 2024-05-01 LAB — PHOSPHORUS: Phosphorus: 4 mg/dL (ref 2.5–4.6)

## 2024-05-01 MED ORDER — SODIUM CHLORIDE 0.9 % IV SOLN
480.0000 mg | Freq: Once | INTRAVENOUS | Status: AC
  Administered 2024-05-01: 480 mg via INTRAVENOUS
  Filled 2024-05-01: qty 48

## 2024-05-01 MED ORDER — SODIUM CHLORIDE 0.9 % IV SOLN: Freq: Once | INTRAVENOUS | Status: AC

## 2024-05-01 MED ORDER — SODIUM CHLORIDE 0.9% FLUSH
10.0000 mL | Freq: Once | INTRAVENOUS | Status: AC
  Administered 2024-05-01: 10 mL

## 2024-05-01 MED ORDER — SODIUM CHLORIDE 0.9 % IV SOLN
200.0000 mg | Freq: Once | INTRAVENOUS | Status: AC
  Administered 2024-05-01: 200 mg via INTRAVENOUS
  Filled 2024-05-01: qty 200

## 2024-05-01 MED ORDER — ACETAMINOPHEN 325 MG PO TABS
650.0000 mg | ORAL_TABLET | Freq: Once | ORAL | Status: AC
  Administered 2024-05-01: 650 mg via ORAL
  Filled 2024-05-01: qty 2

## 2024-05-01 MED ORDER — FOSAPREPITANT DIMEGLUMINE INJECTION 150 MG
150.0000 mg | Freq: Once | INTRAVENOUS | Status: AC
  Administered 2024-05-01: 150 mg via INTRAVENOUS
  Filled 2024-05-01: qty 150

## 2024-05-01 MED ORDER — DEXAMETHASONE SODIUM PHOSPHATE 10 MG/ML IJ SOLN
10.0000 mg | Freq: Once | INTRAMUSCULAR | Status: AC
  Administered 2024-05-01: 10 mg via INTRAVENOUS
  Filled 2024-05-01: qty 1

## 2024-05-01 MED ORDER — FAMOTIDINE IN NACL 20-0.9 MG/50ML-% IV SOLN
20.0000 mg | Freq: Once | INTRAVENOUS | Status: AC
  Administered 2024-05-01: 20 mg via INTRAVENOUS
  Filled 2024-05-01: qty 50

## 2024-05-01 MED ORDER — DIPHENHYDRAMINE HCL 50 MG/ML IJ SOLN
50.0000 mg | Freq: Once | INTRAMUSCULAR | Status: AC
  Administered 2024-05-01: 50 mg via INTRAVENOUS
  Filled 2024-05-01: qty 1

## 2024-05-01 MED ORDER — PALONOSETRON HCL INJECTION 0.25 MG/5ML
0.2500 mg | Freq: Once | INTRAVENOUS | Status: AC
  Administered 2024-05-01: 0.25 mg via INTRAVENOUS
  Filled 2024-05-01: qty 5

## 2024-05-01 NOTE — Progress Notes (Signed)
 Pt declined 30 min post Trodelvy  observation period. Tolerated infusion well. No adverse s/s. No Hx adverse s/s. Pt ambulated independently to lobby with family for d/c.

## 2024-05-01 NOTE — Progress Notes (Signed)
 Patient Care Team: [name], MD as PCP - General (Family Medicine) Odean Potts, MD as Consulting Physician (Hematology and Oncology) Pickenpack-Cousar, Fannie SAILOR, NP as Nurse Practitioner [facility] and Palliative Medicine)  DIAGNOSIS:  Encounter Diagnosis  Name Primary?   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)  06/30/2011 Initial Diagnosis   Breast cancer, IDC, Left, Stage III, Triple negative, 7.6 cm breast mass and palpable axillary mass   09/06/2011 Miscellaneous   BRCA 1 and 2: Negative   09/14/2011 - 11/23/2011 Neo-Adjuvant Chemotherapy   Dose dense FEC followed by dose dense Taxotere    12/21/2011 Surgery   Left lumpectomy: High-grade poorly differentiated IDC 1.7 cm with high-grade DCIS, margins negative, 3/7 lymph nodes positive, ER 0%, PR 0%, HER-2 negative ratio 1.33 T1CN1 stage IIb   12/31/2011 - 02/15/2012 Radiation Therapy   Radiation at [facility]   09/26/2017 Relapse/Recurrence   Left breast upper outer quadrant within the lumpectomy bed: Fibrosis no malignancy, right axillary lymph node biopsy metastatic high-grade carcinoma ER 0%, PR 0%, Ki-67 90%, HER-2 negative ratio 1.11 (similar to previous ductal carcinoma)   11/14/2017 - 03/06/2018 Neo-Adjuvant Chemotherapy   Neo-Adjuvant chemotherapy with Gemzar  and carboplatin  days 1 and 8 every 3 weeks   01/10/2018 Genetic Testing   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  The Hereditary Gene Panel offered by [facility] includes sequencing and/or deletion duplication testing of the following 47 genes: APC, ATM, AXIN2, BARD1, BMPR1A, BRCA1, BRCA2, BRIP1, CDH1, CDK4, CDKN2A (p14ARF), CDKN2A (p16INK4a), CHEK2, CTNNA1, DICER1, EPCAM (Deletion/duplication testing only), GREM1 (promoter region deletion/duplication testing  only), KIT, MEN1, MLH1, MSH2, MSH3, MSH6, MUTYH, NBN, NF1, NHTL1, PALB2, PDGFRA, PMS2, POLD1, POLE, PTEN, RAD50, RAD51C, RAD51D, SDHB, SDHC, SDHD, SMAD4, SMARCA4. STK11, TP53, TSC1, TSC2, and VHL.  The following genes were evaluated for sequence changes only: SDHA and HOXB13 c.251G>A variant only. The report date is January 10, 2018.    05/31/2023 Procedure   Mediastinal mass biopsy: Metastatic poorly differentiated carcinoma compatible with breast primary ER +60%, PR 0%, HER2 0, Ki-67 40%    06/02/2023 PET scan   PET/CT Anterior mediastinal mass is hypermetabolic and centrally necrotic, soft tissue lesions anterior to the heart, 10 mm pleural-based lesion, level 2 cervical nodes, hypermetabolism hepatic dome mottled FDG accumulation throughout the skeletal system    06/06/2023 - 07/14/2023 Anti-estrogen oral therapy   Antiestrogen therapy with Verzenio  and letrozole     07/07/2023 Relapse/Recurrence   Increasing mediastinal mass 11.6 cm (used to be 10.8 cm) effacing the heart and involving the sternum, additional mediastinal/pericardial lymph nodes increased right extra lymph node 1.5 cm, nodules 0.2 cm (used to be 0.8 cm), few new scattered lung nodules)   07/25/2023 -  Chemotherapy   Patient is on Treatment Plan : BREAST METASTATIC Sacituzumab govitecan -hziy (Trodelvy ) D1,8 q21d     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)  04/06/2018 Surgery   Right axillary lymph node dissection: 1/11 node positive for metastatic high-grade carcinoma   04/25/2018 Initial Diagnosis   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)   04/25/2018 Cancer Staging   Staging form: Breast, AJCC 8th Edition - Clinical: Stage IIB (cT0, cN1, cM0, G3, ER-, PR-, HER2-) - Signed by Izell Domino, MD on 04/25/2018   05/16/2018 - 06/22/2018 Radiation Therapy   Adjuvant radiation therapy with Xeloda    07/11/2018 -  10/24/2018 Chemotherapy   Adjuvant chemotherapy with CMF     CHIEF COMPLIANT: Cycle 1 day 1 Keytruda   Trodelvy   HISTORY OF PRESENT ILLNESS:   History of Present Illness Jessica Simpson is a 53 year old female with metastatic breast cancer who presents for immunotherapy regimen management.  She is undergoing treatment with Trodelvy  and Keytruda . Her breathing has improved, but she experiences increased coughing, which she attributes to not taking allergy medication. She has variable shortness of breath, especially when walking, which worsens with fatigue.  Her current medication regimen includes Trodelvy  at 480 mg. Recent lab results show a white blood cell count of 3.0, with pending results expected to be at least 1.9. Her hemoglobin has decreased from 10 to 8.8, raising concern about a potential need for a blood transfusion if it drops below 8. Her platelet count is stable at 87.     ALLERGIES:  is allergic to codeine and hydrocodone-acetaminophen .  MEDICATIONS:  Current Outpatient Medications  Medication Sig Dispense Refill   acetaminophen  (TYLENOL ) 500 MG tablet Take 500 mg by mouth every 6 (six) hours as needed. For pain     albuterol  (ACCUNEB ) 0.63 MG/3ML nebulizer solution Take 3 mLs (0.63 mg total) by nebulization every 6 (six) hours as needed for wheezing. 75 mL 12   amLODipine  (NORVASC ) 5 MG tablet Take 10 mg by mouth daily.  6   Cyanocobalamin  (VITAMIN B-12) 5000 MCG TBDP Take 5,000 mcg by mouth daily.     diphenoxylate -atropine  (LOMOTIL ) 2.5-0.025 MG tablet Take 1-2 tablets by mouth 4 (four) times daily as needed for diarrhea or loose stools. 60 tablet 0   ibuprofen  (ADVIL ) 800 MG tablet TAKE 1 TABLET BY MOUTH EVERY 8 HOURS AS NEEDED 30 tablet 0   lidocaine -prilocaine  (EMLA ) cream Apply to affected area once 30 g 3   loratadine  (CLARITIN ) 10 MG tablet Take 1 tablet (10 mg total) by mouth daily as needed for allergies. 90 tablet 2   LORazepam  (ATIVAN ) 1 MG tablet Take 1 tablet (1 mg total) by mouth every 8 (eight) hours. 10 tablet 0   magnesium  oxide (MAG-OX) 400 MG  tablet TAKE 1 TABLET BY MOUTH EVERY DAY 30 tablet 1   metoprolol  succinate (TOPROL  XL) 25 MG 24 hr tablet Take 1 tablet (25 mg total) by mouth daily. 90 tablet 3   Omega-3 Fatty Acids (FISH OIL MAXIMUM STRENGTH) 1200 MG CPDR Take 1,200 mg by mouth daily.     ondansetron  (ZOFRAN ) 8 MG tablet Take 1 tablet (8 mg total) by mouth 2 (two) times daily. 60 tablet 6   prochlorperazine  (COMPAZINE ) 10 MG tablet Take 1 tablet (10 mg total) by mouth every 6 (six) hours as needed for nausea or vomiting. 30 tablet 6   Vitamin D , Ergocalciferol , (DRISDOL ) 1.25 MG (50000 UNIT) CAPS capsule Take 1 capsule (50,000 Units total) by mouth once a week. 12 capsule 2   No current facility-administered medications for this visit.    PHYSICAL EXAMINATION: ECOG PERFORMANCE STATUS: 1 - Symptomatic but completely ambulatory  Vitals:   05/01/24 1056  BP: (!) 134/97  Pulse: 98  Resp: 17  Temp: 97.8 F (36.6 C)  SpO2: 100%   Filed Weights   05/01/24 1056  Weight: 212 lb 6.4 oz (96.3 kg)    Physical Exam   (exam performed in the presence of a chaperone)  LABORATORY DATA:  I have reviewed the data as listed    Latest Ref Rng & Units 04/09/2024    9:56 AM 03/21/2024  10:00 AM 03/05/2024   10:26 AM  CMP  Glucose 70 - 99 mg/dL 96  860  93   BUN 6 - 20 mg/dL 20  17  17    Creatinine 0.44 - 1.00 mg/dL 9.04  9.05  9.09   Sodium 135 - 145 mmol/L 139  136  138   Potassium 3.5 - 5.1 mmol/L 3.9  3.1  3.4   Chloride 98 - 111 mmol/L 103  100  103   CO2 22 - 32 mmol/L 28  23  28    Calcium 8.9 - 10.3 mg/dL 9.5  9.1  9.2   Total Protein 6.5 - 8.1 g/dL 7.4  7.3  7.1   Total Bilirubin 0.0 - 1.2 mg/dL 0.4  0.6  0.4   Alkaline Phos 38 - 126 U/L 93  103  101   AST 15 - 41 U/L 15  19  12    ALT 0 - 44 U/L 10  12  7      Lab Results  Component Value Date   WBC 2.7 (L) 04/09/2024   HGB 9.7 (L) 04/09/2024   HCT 29.3 (L) 04/09/2024   MCV 91.3 04/09/2024   PLT 88 (L) 04/09/2024   NEUTROABS 1.9 04/09/2024     ASSESSMENT & PLAN:  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) 06/30/2011: Left breast cancer stage III triple negative, 7.6 cm 09/14/2011: Dose dense FEC followed by dose dense Taxotere  neoadjuvant chemo 12/21/2011: Left lumpectomy: High-grade poorly differentiated IDC 1.7 cm 3/7 nodes positive triple negative, XRT 09/26/2017: Relapse: Right axillary lymph node biopsy positive for metastatic high-grade carcinoma triple negative 11/14/2017: Neoadjuvant Gemzar  and carboplatin  05/31/2023: Relapse: Mediastinal mass biopsy: Metastatic poorly differentiated carcinoma ER 60%, HER2 negative 06/06/2023: Verzinio with letrozole  07/25/2023: Trodelvy  02/21/2024: Chest wall skin biopsy: Poorly differentiated carcinoma ER 0% PR 0% Ki-67 40%, HER2 0 --------------------------------------------------------------------------------------------------------------------- Caris molecular testing: PD-L1 mutation: Adding Keytruda  to Trodelvy  based on clinical trial ASCENT 04 (median PFS 11.2 months versus 7.8 months)  Current Treatment: Trodelvy  and Keytruda  cycle 1 day 1 Toxicities: Anemia: I recommend that we start her on Aranesp injections.  I will send the order to be authorized. Thrombocytopenia: Okay to treat with a platelet count of 88 Neutropenia: Receiving Granix  injections  Shortness of breath and cough Cutaneous metastasis Return to clinic in 1 week for cycle 1 day 8 Trodelvy . ------------------------------------- Assessment and Plan Assessment & Plan Metastatic breast cancer with intrathoracic lymph node involvement Continued Trodelvy  and Keytruda . Monitoring for side effects and blood counts is essential. Fatigue and cough may indicate treatment effects or cancer progression. - Continue Trodelvy  and Keytruda . - Monitor for side effects: diarrhea, rash, thyroid  dysfunction. - Order PET scan to assess treatment response. - Monitor blood counts, especially WBC and platelets. - Adjust  treatment based on side effects and blood count results.  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 Fatigue likely related to anemia. Current hemoglobin above transfusion threshold but trend suggests intervention needed. - Administer Aranesp injection every three weeks. - Monitor hemoglobin levels closely. - Prepare for transfusion if hemoglobin drops below 8 and fatigue worsens.      No orders of the defined types were placed in this encounter.  The patient has a good understanding of the overall plan. she agrees with it. she will call with any problems that may develop before the next visit here. Total time spent: 30 mins including face to face time and time spent for planning,  charting and co-ordination of care   Naomi MARLA Chad, MD 05/01/24

## 2024-05-01 NOTE — Radiation Completion Notes (Signed)
 Patient Name: Jessica Simpson, Jessica Simpson MRN: [id number] Date of Birth: 06-Aug-1971 Referring Physician: MACKEY CHAD, M.D. Date of Service: 2024-05-01 Radiation Oncologist: Lauraine Golden, M.D. 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 Staging on 2011-08-03: 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) T=T3, N=N2, M=cM0 Staging on 2018-04-25: 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) T=pT0, N=pN1, M=cM0 Staging on 2018-04-25: 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) T=cT0, N=cN1, M=cM0 Intent: Palliative     ==========DELIVERED PLANS==========  First Treatment Date: 2024-04-16 Last Treatment Date: 2024-04-30   Plan Name: Chest Site: Chest Technique: Electron Mode: Electron Dose Per Fraction: 3 Gy Prescribed Dose (Delivered / Prescribed): 30 Gy / 30 Gy Prescribed Fxs (Delivered / Prescribed): 10 / 10   Plan Name: Axilla_R Site: Axilla, Right Technique: Electron Mode: Electron Dose Per Fraction: 3 Gy Prescribed Dose (Delivered / Prescribed): 30 Gy / 30 Gy Prescribed Fxs (Delivered / Prescribed): 10 / 10     ==========ON TREATMENT VISIT DATES========== 2024-04-16, 2024-04-23     ==========UPCOMING VISITS========== 06/11/2024 [facility] AT [facility] REMOTE PACER CK CVD HVT DEVICE REMOTES  06/05/2024 [facility]-RADIATION ONC FOLLOW UP 30 Wyatt Leeroy CHRISTELLA DEVONNA  05/14/2024 [facility] MEDICINE NM PET WB & SB TO MID THIGH [facility]-NM PET CT 1  05/11/2024 [facility] AT [facility] ST PHYS PACER CK Lesia Ozell Barter, PA-C  05/10/2024 [facility]-MED ONCOLOGY EST PT 15 CHAD MACKEY, MD  05/08/2024 [facility]-MED ONCOLOGY INFUSION 4HR30MIN (270) [facility] INFUSION  05/08/2024 [facility]MED ONCOLOGY PORT FLUSH W/LAB [facility] MED[facility]C FLUSH  05/05/2024 [facility] ONCOLOGY INJECTION [facility] INFUSION  05/01/2024 [facility]-MED  ONCOLOGY INFUSION 4HR30MIN (270) [facility] INFUSION  05/01/2024 [facility]MED ONCOLOGY EST PT 15 CHAD MACKEY, MD  05/01/2024 [facility]MED ONCOLOGY PORT FLUSH W/LAB [facility] MED[facility] FLUSH        ==========APPENDIX - ON TREATMENT VISIT NOTES==========   See weekly On Treatment Notes in Epic for details in the Media tab (listed as Progress notes on the On Treatment Visit Dates listed above).

## 2024-05-01 NOTE — Patient Instructions (Signed)
 [facility] - A DEPT OF [facility]  Discharge Instructions: Thank you for choosing [facility] to provide your oncology and hematology care.   If you have a lab appointment with the Cancer Center, please go directly to the Cancer Center and check in at the registration area.   Wear comfortable clothing and clothing appropriate for easy access to any Portacath or PICC line.   We strive to give you quality time with your provider. You may need to reschedule your appointment if you arrive late (15 or more minutes).  Arriving late affects you and other patients whose appointments are after yours.  Also, if you miss three or more appointments without notifying the office, you may be dismissed from the clinic at the provider's discretion.      For prescription refill requests, have your pharmacy contact our office and allow 72 hours for refills to be completed.    Today you received the following chemotherapy and/or immunotherapy agents Keytruda /Trodelvy       To help prevent nausea and vomiting after your treatment, we encourage you to take your nausea medication as directed.  BELOW ARE SYMPTOMS THAT SHOULD BE REPORTED IMMEDIATELY: *FEVER GREATER THAN 100.4 F (38 C) OR HIGHER *CHILLS OR SWEATING *NAUSEA AND VOMITING THAT IS NOT CONTROLLED WITH YOUR NAUSEA MEDICATION *UNUSUAL SHORTNESS OF BREATH *UNUSUAL BRUISING OR BLEEDING *URINARY PROBLEMS (pain or burning when urinating, or frequent urination) *BOWEL PROBLEMS (unusual diarrhea, constipation, pain near the anus) TENDERNESS IN MOUTH AND THROAT WITH OR WITHOUT PRESENCE OF ULCERS (sore throat, sores in mouth, or a toothache) UNUSUAL RASH, SWELLING OR PAIN  UNUSUAL VAGINAL DISCHARGE OR ITCHING   Items with * indicate a potential emergency and should be followed up as soon as possible or go to the Emergency Department if any problems should occur.  Please show the CHEMOTHERAPY ALERT CARD or  IMMUNOTHERAPY ALERT CARD at check-in to the Emergency Department and triage nurse.  Should you have questions after your visit or need to cancel or reschedule your appointment, please contact [facility] - A DEPT OF [facility]  Dept: [phone]  and follow the prompts.  Office hours are 8:00 a.m. to 4:30 p.m. Monday - Friday. Please note that voicemails left after 4:00 p.m. may not be returned until the following business day.  We are closed weekends and major holidays. You have access to a nurse at all times for urgent questions. Please call the main number to the clinic Dept: [phone] and follow the prompts.   For any non-urgent questions, you may also contact your provider using MyChart. We now offer e-Visits for anyone 65 and older to request care online for non-urgent symptoms. For details visit [email or web address].   Also download the MyChart app! Go to the app store, search MyChart, open the app, select [place], and log in with your MyChart username and password.

## 2024-05-02 ENCOUNTER — Telehealth: Payer: Self-pay

## 2024-05-02 ENCOUNTER — Encounter: Payer: Self-pay | Admitting: *Deleted

## 2024-05-02 NOTE — Telephone Encounter (Signed)
 Ms. Jessica Simpson states that she is doing fine. She is eating, drinking, and urinating well. She knows to call the office at [phone] if  she has any questions or concerns.

## 2024-05-02 NOTE — Telephone Encounter (Signed)
-----   Message from Nurse Ileana HERO sent at 05/01/2024  3:49 PM EDT ----- Regarding: First time Keytruda . Pt of Gudena First time Keytruda . Pt of gudena. Tolerated infusion well. Not first chemotherapy (received Trodelvy  also). Please call to check in with pt please, thanks!

## 2024-05-02 NOTE — Progress Notes (Signed)
 PA team states no PA is needed for Aranesp injection.  Per MD okay to proceed with Aranesp injection this weekend along with Zarxio  and to treat with labs from 05/01/24.

## 2024-05-03 ENCOUNTER — Telehealth: Payer: Self-pay

## 2024-05-03 NOTE — Telephone Encounter (Signed)
-----   Message from Nurse Larraine D sent at 05/02/2024  8:55 AM EDT ----- Regarding: RE: PA and appt for Aranesp Great, thank you!  Nanetta can you please set her up to get her Aranesp this Saturday 7/12 as well as lab and injection q3 weeks. Please call her to confirm. ----- Message ----- From: Chauncey Jarvis Sent: 05/02/2024   8:17 AM EDT To: Rudean LITTIE Franks, LPN; Thu Ladora Queen, RN; Sandr# Subject: RE: PA and appt for Aranesp                    Good morning - shara is not required for Aranesp.  Thanks,  Brandi ----- Message ----- From: Wallene Larraine LITTIE, RN Sent: 05/01/2024   4:05 PM EDT To: Rudean LITTIE Franks, LPN; Thu Ladora Queen, RN; Brand# Subject: PA and appt for Aranesp                        Walterine Jarvis, Dr. Gudena wants this pt to get her Aranesp ideally this weekend when she comes in for her other injection.  Can you please work on [place].   Nanetta once we get approval she will need set up q3 weeks for lab and the Aranesp injection.

## 2024-05-03 NOTE — Addendum Note (Signed)
 Addended by: TAWNI DRILLING D on: 05/03/2024 09:52 AM   Modules accepted: Orders

## 2024-05-03 NOTE — Telephone Encounter (Signed)
 Attempted to call pt to let her know she will receive aranesp when she comes in Saturday for her zarxio . LVM letting her know and to call us  back with any questions.

## 2024-05-03 NOTE — Progress Notes (Signed)
 Remote pacemaker transmission.

## 2024-05-04 ENCOUNTER — Ambulatory Visit

## 2024-05-05 ENCOUNTER — Inpatient Hospital Stay

## 2024-05-05 VITALS — BP 137/78 | HR 100 | Temp 98.1°F | Resp 17

## 2024-05-05 DIAGNOSIS — C50012 Malignant neoplasm of nipple and areola, left female breast: Secondary | ICD-10-CM | POA: Diagnosis not present

## 2024-05-05 DIAGNOSIS — Z95828 Presence of other vascular implants and grafts: Secondary | ICD-10-CM

## 2024-05-05 DIAGNOSIS — C50919 Malignant neoplasm of unspecified site of unspecified female breast: Secondary | ICD-10-CM

## 2024-05-05 DIAGNOSIS — N6331 Unspecified lump in axillary tail of the right breast: Secondary | ICD-10-CM

## 2024-05-05 MED ORDER — FILGRASTIM-SNDZ 480 MCG/0.8ML IJ SOSY
480.0000 ug | PREFILLED_SYRINGE | Freq: Once | INTRAMUSCULAR | Status: AC
Start: 2024-05-05 — End: 2024-05-05
  Administered 2024-05-05: 480 ug via SUBCUTANEOUS

## 2024-05-05 MED ORDER — DARBEPOETIN ALFA 300 MCG/0.6ML IJ SOSY
300.0000 ug | PREFILLED_SYRINGE | Freq: Once | INTRAMUSCULAR | Status: AC
  Administered 2024-05-05: 300 ug via SUBCUTANEOUS

## 2024-05-07 ENCOUNTER — Other Ambulatory Visit

## 2024-05-07 ENCOUNTER — Ambulatory Visit

## 2024-05-07 MED FILL — Fosaprepitant Dimeglumine For IV Infusion 150 MG (Base Eq): INTRAVENOUS | Qty: 5 | Status: AC

## 2024-05-08 ENCOUNTER — Inpatient Hospital Stay

## 2024-05-08 ENCOUNTER — Encounter: Payer: Self-pay | Admitting: Hematology and Oncology

## 2024-05-08 VITALS — BP 144/79 | HR 99 | Temp 98.5°F | Resp 20 | Wt 213.5 lb

## 2024-05-08 DIAGNOSIS — N6331 Unspecified lump in axillary tail of the right breast: Secondary | ICD-10-CM

## 2024-05-08 DIAGNOSIS — C50919 Malignant neoplasm of unspecified site of unspecified female breast: Secondary | ICD-10-CM

## 2024-05-08 DIAGNOSIS — C50012 Malignant neoplasm of nipple and areola, left female breast: Secondary | ICD-10-CM | POA: Diagnosis not present

## 2024-05-08 DIAGNOSIS — Z95828 Presence of other vascular implants and grafts: Secondary | ICD-10-CM

## 2024-05-08 LAB — MAGNESIUM: Magnesium: 1.7 mg/dL (ref 1.7–2.4)

## 2024-05-08 LAB — CBC WITH DIFFERENTIAL (CANCER CENTER ONLY)
Abs Immature Granulocytes: 0.22 K/uL — ABNORMAL HIGH (ref 0.00–0.07)
Basophils Absolute: 0 K/uL (ref 0.0–0.1)
Basophils Relative: 1 %
Eosinophils Absolute: 0.1 K/uL (ref 0.0–0.5)
Eosinophils Relative: 2 %
HCT: 25.4 % — ABNORMAL LOW (ref 36.0–46.0)
Hemoglobin: 8.5 g/dL — ABNORMAL LOW (ref 12.0–15.0)
Immature Granulocytes: 9 %
Lymphocytes Relative: 9 %
Lymphs Abs: 0.2 K/uL — ABNORMAL LOW (ref 0.7–4.0)
MCH: 30.8 pg (ref 26.0–34.0)
MCHC: 33.5 g/dL (ref 30.0–36.0)
MCV: 92 fL (ref 80.0–100.0)
Monocytes Absolute: 0.3 K/uL (ref 0.1–1.0)
Monocytes Relative: 13 %
Neutro Abs: 1.7 K/uL (ref 1.7–7.7)
Neutrophils Relative %: 66 %
Platelet Count: 65 K/uL — ABNORMAL LOW (ref 150–400)
RBC: 2.76 MIL/uL — ABNORMAL LOW (ref 3.87–5.11)
RDW: 16.4 % — ABNORMAL HIGH (ref 11.5–15.5)
WBC Count: 2.5 K/uL — ABNORMAL LOW (ref 4.0–10.5)
nRBC: 0 % (ref 0.0–0.2)

## 2024-05-08 LAB — CMP (CANCER CENTER ONLY)
ALT: 7 U/L (ref 0–44)
AST: 13 U/L — ABNORMAL LOW (ref 15–41)
Albumin: 3.8 g/dL (ref 3.5–5.0)
Alkaline Phosphatase: 88 U/L (ref 38–126)
Anion gap: 7 (ref 5–15)
BUN: 17 mg/dL (ref 6–20)
CO2: 28 mmol/L (ref 22–32)
Calcium: 9.2 mg/dL (ref 8.9–10.3)
Chloride: 104 mmol/L (ref 98–111)
Creatinine: 0.86 mg/dL (ref 0.44–1.00)
GFR, Estimated: >60 mL/min (ref >60)
Glucose, Bld: 98 mg/dL (ref 70–99)
Potassium: 3.5 mmol/L (ref 3.5–5.1)
Sodium: 139 mmol/L (ref 135–145)
Total Bilirubin: 0.3 mg/dL (ref 0.0–1.2)
Total Protein: 7 g/dL (ref 6.5–8.1)

## 2024-05-08 LAB — SAMPLE TO BLOOD BANK

## 2024-05-08 LAB — PHOSPHORUS: Phosphorus: 3.7 mg/dL (ref 2.5–4.6)

## 2024-05-08 LAB — ABO/RH: ABO/RH(D): O POS

## 2024-05-08 MED ORDER — HEPARIN SOD (PORK) LOCK FLUSH 100 UNIT/ML IV SOLN
500.0000 [IU] | Freq: Once | INTRAVENOUS | Status: AC | PRN
  Administered 2024-05-08: 500 [IU]

## 2024-05-08 MED ORDER — SODIUM CHLORIDE 0.9% FLUSH
10.0000 mL | INTRAVENOUS | Status: DC | PRN
  Administered 2024-05-08: 10 mL

## 2024-05-08 MED ORDER — SODIUM CHLORIDE 0.9% FLUSH
10.0000 mL | Freq: Once | INTRAVENOUS | Status: AC
  Administered 2024-05-08: 10 mL

## 2024-05-08 NOTE — Progress Notes (Unsigned)
 Platelet level is 65, Dr. Odean notified and stated to hold off on todays treatment and have her come back in 2 weeks for next cycle the help recover her counts better. Patient notified on the plan per Dr. Odean and she verbalized understanding. Port flushed and de-accessed.

## 2024-05-10 ENCOUNTER — Inpatient Hospital Stay: Payer: 59 | Admitting: Hematology and Oncology

## 2024-05-10 NOTE — Progress Notes (Unsigned)
  Electrophysiology Office Note:   ID:  Jessica Simpson, DOB [birth year], MRN [id number]  Primary Cardiologist: None Electrophysiologist: Danelle Birmingham, MD  {Click to update primary MD,subspecialty MD or APP then REFRESH:1}    History of Present Illness:   Jessica Simpson is a 53 y.o. female with h/o recurrent breast CA s/p chemo and XRT, and CHB s/p PPM seen today for routine electrophysiology followup.   Pt is undergoing radiation for treatment of metastatic breast CA.   Since last being seen in our clinic the patient reports doing ***.  she denies chest pain, palpitations, dyspnea, PND, orthopnea, nausea, vomiting, dizziness, syncope, edema, weight gain, or early satiety.   Review of systems complete and found to be negative unless listed in HPI.   EP Information / Studies Reviewed:    EKG is ordered today. Personal review as below.       PPM Interrogation-  reviewed in detail today,  See PACEART report.  Arrhythmia/Device History MDT PPM GT   Physical Exam:   VS:  LMP 09/19/2018    Wt Readings from Last 3 Encounters:  05/08/24 213 lb 8 oz (96.8 kg)  05/01/24 212 lb 6.4 oz (96.3 kg)  04/09/24 212 lb 6.4 oz (96.3 kg)     GEN: No acute distress  NECK: No JVD; No carotid bruits CARDIAC: {EPRHYTHM:28826}, no murmurs, rubs, gallops RESPIRATORY:  Clear to auscultation without rales, wheezing or rhonchi  ABDOMEN: Soft, non-tender, non-distended EXTREMITIES:  {EDEMA LEVEL:28147::No} edema; No deformity   ASSESSMENT AND PLAN:    CHB s/p Medtronic PPM  Normal PPM function See Pace Art report No changes today  Sinus tachycardia Continue low dose toprol   Breast CA Undergoing ***  HTN Stable on current regimen   {Click here to Review PMH, Prob List, Meds, Allergies, SHx, FHx  :1}   Disposition:   Follow up with {EPPROVIDERS:28135::EP Team} {EPFOLLOW UP:28173}  Signed, Ozell Prentice Passey, PA-C

## 2024-05-11 ENCOUNTER — Ambulatory Visit: Attending: Student | Admitting: Student

## 2024-05-11 ENCOUNTER — Encounter: Payer: Self-pay | Admitting: Student

## 2024-05-11 ENCOUNTER — Encounter: Payer: Self-pay | Admitting: Hematology and Oncology

## 2024-05-11 VITALS — BP 115/75 | HR 109 | Ht 66.0 in | Wt 208.0 lb

## 2024-05-11 DIAGNOSIS — I442 Atrioventricular block, complete: Secondary | ICD-10-CM | POA: Diagnosis not present

## 2024-05-11 DIAGNOSIS — R000 Tachycardia, unspecified: Secondary | ICD-10-CM | POA: Diagnosis not present

## 2024-05-11 DIAGNOSIS — C50919 Malignant neoplasm of unspecified site of unspecified female breast: Secondary | ICD-10-CM

## 2024-05-11 DIAGNOSIS — C771 Secondary and unspecified malignant neoplasm of intrathoracic lymph nodes: Secondary | ICD-10-CM

## 2024-05-11 DIAGNOSIS — I10 Essential (primary) hypertension: Secondary | ICD-10-CM | POA: Diagnosis not present

## 2024-05-11 LAB — CUP PACEART INCLINIC DEVICE CHECK
Battery Remaining Longevity: 126 mo
Battery Voltage: 3.02 V
Brady Statistic AP VP Percent: 0.01 %
Brady Statistic AP VS Percent: 0 %
Brady Statistic AS VP Percent: 99.81 %
Brady Statistic AS VS Percent: 0.18 %
Brady Statistic RA Percent Paced: 0.01 %
Brady Statistic RV Percent Paced: 99.82 %
Date Time Interrogation Session: 20250718124939
Implantable Lead Connection Status: 753985
Implantable Lead Connection Status: 753985
Implantable Lead Implant Date: 20240417
Implantable Lead Implant Date: 20240417
Implantable Lead Location: 753859
Implantable Lead Location: 753860
Implantable Lead Model: 3830
Implantable Lead Model: 5076
Implantable Pulse Generator Implant Date: 20240417
Lead Channel Impedance Value: 247 Ohm
Lead Channel Impedance Value: 323 Ohm
Lead Channel Impedance Value: 380 Ohm
Lead Channel Impedance Value: 608 Ohm
Lead Channel Pacing Threshold Amplitude: 0.625 V
Lead Channel Pacing Threshold Amplitude: 1.125 V
Lead Channel Pacing Threshold Pulse Width: 0.4 ms
Lead Channel Pacing Threshold Pulse Width: 0.4 ms
Lead Channel Sensing Intrinsic Amplitude: 18 mV
Lead Channel Sensing Intrinsic Amplitude: 18 mV
Lead Channel Sensing Intrinsic Amplitude: 2.25 mV
Lead Channel Sensing Intrinsic Amplitude: 2.25 mV
Lead Channel Setting Pacing Amplitude: 1.5 V
Lead Channel Setting Pacing Amplitude: 2.25 V
Lead Channel Setting Pacing Pulse Width: 0.4 ms
Lead Channel Setting Sensing Sensitivity: 0.9 mV
Zone Setting Status: 755011
Zone Setting Status: 755011

## 2024-05-11 NOTE — Patient Instructions (Signed)
 Medication Instructions:  Your physician recommends that you continue on your current medications as directed. Please refer to the Current Medication list given to you today.  *If you need a refill on your cardiac medications before your next appointment, please call your pharmacy*  Lab Work: None ordered If you have labs (blood work) drawn today and your tests are completely normal, you will receive your results only by: MyChart Message (if you have MyChart) OR A paper copy in the mail If you have any lab test that is abnormal or we need to change your treatment, we will call you to review the results.  Follow-Up: At [facility], you and your health needs are our priority.  As part of our continuing mission to provide you with exceptional heart care, our providers are all part of one team.  This team includes your primary Cardiologist (physician) and Advanced Practice Providers or APPs (Physician Assistants and Nurse Practitioners) who all work together to provide you with the care you need, when you need it.  Your next appointment:   1 year(s)  Provider:   You will see one of the following Advanced Practice Providers on your designated Care Team:   Mertha Abrahams, Kennard Pea [address] [place], PA-C Suzann Riddle, NP Creighton Doffing, NP

## 2024-05-14 ENCOUNTER — Ambulatory Visit (HOSPITAL_COMMUNITY)
Admission: RE | Admit: 2024-05-14 | Discharge: 2024-05-14 | Disposition: A | Discharge status: Home / Self Care | Source: Ambulatory Visit | Attending: Hematology and Oncology | Admitting: Hematology and Oncology

## 2024-05-14 DIAGNOSIS — C50919 Malignant neoplasm of unspecified site of unspecified female breast: Secondary | ICD-10-CM | POA: Insufficient documentation

## 2024-05-14 DIAGNOSIS — C771 Secondary and unspecified malignant neoplasm of intrathoracic lymph nodes: Secondary | ICD-10-CM | POA: Diagnosis present

## 2024-05-14 LAB — GLUCOSE, CAPILLARY: Glucose-Capillary: 91 mg/dL (ref 70–99)

## 2024-05-14 MED ORDER — FLUDEOXYGLUCOSE F - 18 (FDG) INJECTION
10.4000 | Freq: Once | INTRAVENOUS | Status: AC
  Administered 2024-05-14: 10.4 via INTRAVENOUS

## 2024-05-15 ENCOUNTER — Other Ambulatory Visit: Payer: Self-pay | Admitting: Hematology and Oncology

## 2024-05-15 ENCOUNTER — Encounter: Payer: Self-pay | Admitting: Hematology and Oncology

## 2024-05-15 DIAGNOSIS — N6331 Unspecified lump in axillary tail of the right breast: Secondary | ICD-10-CM

## 2024-05-15 DIAGNOSIS — C50919 Malignant neoplasm of unspecified site of unspecified female breast: Secondary | ICD-10-CM

## 2024-05-15 NOTE — Progress Notes (Addendum)
 Per Dr. Gudena, pt does not need C13 Granix  day as C13D8 was held. C13D8 canceled in tx plan, and 7/26 injection appt canceled by RN.  Kaevon Cotta, PharmD, MBA

## 2024-05-19 ENCOUNTER — Inpatient Hospital Stay

## 2024-05-20 ENCOUNTER — Other Ambulatory Visit: Payer: Self-pay | Admitting: Hematology and Oncology

## 2024-05-21 ENCOUNTER — Other Ambulatory Visit: Payer: Self-pay

## 2024-05-21 ENCOUNTER — Inpatient Hospital Stay (HOSPITAL_BASED_OUTPATIENT_CLINIC_OR_DEPARTMENT_OTHER): Admitting: Hematology and Oncology

## 2024-05-21 ENCOUNTER — Other Ambulatory Visit: Payer: Self-pay | Admitting: Pharmacist

## 2024-05-21 ENCOUNTER — Inpatient Hospital Stay

## 2024-05-21 ENCOUNTER — Encounter: Payer: Self-pay | Admitting: Hematology and Oncology

## 2024-05-21 VITALS — BP 119/61 | HR 100 | Temp 97.6°F | Resp 18 | Ht 66.0 in | Wt 213.2 lb

## 2024-05-21 DIAGNOSIS — C50919 Malignant neoplasm of unspecified site of unspecified female breast: Secondary | ICD-10-CM

## 2024-05-21 DIAGNOSIS — N6331 Unspecified lump in axillary tail of the right breast: Secondary | ICD-10-CM

## 2024-05-21 DIAGNOSIS — C771 Secondary and unspecified malignant neoplasm of intrathoracic lymph nodes: Secondary | ICD-10-CM | POA: Diagnosis not present

## 2024-05-21 DIAGNOSIS — C50611 Malignant neoplasm of axillary tail of right female breast: Secondary | ICD-10-CM

## 2024-05-21 DIAGNOSIS — C50012 Malignant neoplasm of nipple and areola, left female breast: Secondary | ICD-10-CM | POA: Diagnosis not present

## 2024-05-21 DIAGNOSIS — Z95828 Presence of other vascular implants and grafts: Secondary | ICD-10-CM

## 2024-05-21 LAB — PREPARE RBC (CROSSMATCH)

## 2024-05-21 LAB — CMP (CANCER CENTER ONLY)
ALT: 8 U/L (ref 0–44)
AST: 15 U/L (ref 15–41)
Albumin: 3.8 g/dL (ref 3.5–5.0)
Alkaline Phosphatase: 90 U/L (ref 38–126)
Anion gap: 9 (ref 5–15)
BUN: 18 mg/dL (ref 6–20)
CO2: 28 mmol/L (ref 22–32)
Calcium: 8.9 mg/dL (ref 8.9–10.3)
Chloride: 101 mmol/L (ref 98–111)
Creatinine: 0.96 mg/dL (ref 0.44–1.00)
GFR, Estimated: >60 mL/min (ref >60)
Glucose, Bld: 178 mg/dL — ABNORMAL HIGH (ref 70–99)
Potassium: 3 mmol/L — ABNORMAL LOW (ref 3.5–5.1)
Sodium: 138 mmol/L (ref 135–145)
Total Bilirubin: 0.4 mg/dL (ref 0.0–1.2)
Total Protein: 7.5 g/dL (ref 6.5–8.1)

## 2024-05-21 LAB — SAMPLE TO BLOOD BANK

## 2024-05-21 LAB — CBC WITH DIFFERENTIAL (CANCER CENTER ONLY)
Abs Immature Granulocytes: 0.09 K/uL — ABNORMAL HIGH (ref 0.00–0.07)
Basophils Absolute: 0 K/uL (ref 0.0–0.1)
Basophils Relative: 2 %
Eosinophils Absolute: 0.1 K/uL (ref 0.0–0.5)
Eosinophils Relative: 7 %
HCT: 23 % — ABNORMAL LOW (ref 36.0–46.0)
Hemoglobin: 7.6 g/dL — ABNORMAL LOW (ref 12.0–15.0)
Immature Granulocytes: 9 %
Lymphocytes Relative: 20 %
Lymphs Abs: 0.2 K/uL — ABNORMAL LOW (ref 0.7–4.0)
MCH: 30.9 pg (ref 26.0–34.0)
MCHC: 33 g/dL (ref 30.0–36.0)
MCV: 93.5 fL (ref 80.0–100.0)
Monocytes Absolute: 0.1 K/uL (ref 0.1–1.0)
Monocytes Relative: 12 %
Neutro Abs: 0.5 K/uL — ABNORMAL LOW (ref 1.7–7.7)
Neutrophils Relative %: 50 %
Platelet Count: 81 K/uL — ABNORMAL LOW (ref 150–400)
RBC: 2.46 MIL/uL — ABNORMAL LOW (ref 3.87–5.11)
RDW: 16 % — ABNORMAL HIGH (ref 11.5–15.5)
WBC Count: 1 K/uL — ABNORMAL LOW (ref 4.0–10.5)
nRBC: 0 % (ref 0.0–0.2)

## 2024-05-21 LAB — MAGNESIUM: Magnesium: 1.7 mg/dL (ref 1.7–2.4)

## 2024-05-21 LAB — PHOSPHORUS: Phosphorus: 3.9 mg/dL (ref 2.5–4.6)

## 2024-05-21 LAB — TSH: TSH: 3.175 u[IU]/mL (ref 0.350–4.500)

## 2024-05-21 MED ORDER — HEPARIN SOD (PORK) LOCK FLUSH 100 UNIT/ML IV SOLN
500.0000 [IU] | Freq: Once | INTRAVENOUS | Status: AC
Start: 2024-05-21 — End: 2024-05-21
  Administered 2024-05-21: 500 [IU]

## 2024-05-21 MED ORDER — FILGRASTIM-SNDZ 480 MCG/0.8ML IJ SOSY
480.0000 ug | PREFILLED_SYRINGE | Freq: Once | INTRAMUSCULAR | Status: AC
  Administered 2024-05-21: 480 ug via SUBCUTANEOUS
  Filled 2024-05-21: qty 0.8

## 2024-05-21 MED ORDER — SODIUM CHLORIDE 0.9% FLUSH
10.0000 mL | Freq: Once | INTRAVENOUS | Status: AC
Start: 2024-05-21 — End: 2024-05-21
  Administered 2024-05-21: 10 mL

## 2024-05-21 MED FILL — Fosaprepitant Dimeglumine For IV Infusion 150 MG (Base Eq): INTRAVENOUS | Qty: 5 | Status: AC

## 2024-05-21 NOTE — Progress Notes (Signed)
 Per MD, orders placed for 1 unit PRBC to be transfused at 05/22/24 infusion appt. D/t pt's ANC 0.5, MD gave orders to hold Trodelvy  but proceed with planned Keytruda  in addition to PRBC. She will also receive 3 doses of Zarxio  per MD today, tomorrow and Saturday. Zarxio  given today in left lower abd tissue and she was d/c home ambulatory. She was given instructions to keep blue blood bank bracelet in place and she verbalized agreement.

## 2024-05-21 NOTE — Assessment & Plan Note (Signed)
 06/30/2011: Left breast cancer stage III triple negative, 7.6 cm 09/14/2011: Dose dense FEC followed by dose dense Taxotere  neoadjuvant chemo 12/21/2011: Left lumpectomy: High-grade poorly differentiated IDC 1.7 cm 3/7 nodes positive triple negative, XRT 09/26/2017: Relapse: Right axillary lymph node biopsy positive for metastatic high-grade carcinoma triple negative 11/14/2017: Neoadjuvant Gemzar  and carboplatin  05/31/2023: Relapse: Mediastinal mass biopsy: Metastatic poorly differentiated carcinoma ER 60%, HER2 negative 06/06/2023: Verzinio with letrozole  07/25/2023: Trodelvy  02/21/2024: Chest wall skin biopsy: Poorly differentiated carcinoma ER 0% PR 0% Ki-67 40%, HER2 0 --------------------------------------------------------------------------------------------------------------------- [facility] molecular testing: PD-L1 mutation: Adding Keytruda  to Trodelvy  based on clinical trial ASCENT 04 (median PFS 11.2 months versus 7.8 months)   Current Treatment: Trodelvy  and Keytruda  (Keytruda  added on  Toxicities: Anemia: I recommend that we start her on Aranesp  injections.  I will send the order to be authorized. Thrombocytopenia: Okay to treat with a platelet count of 88 Neutropenia: Receiving Granix  injections   Shortness of breath and cough Cutaneous metastasis  PET-CT: 05/16/2024: Increased metabolic activity throughout both breast suggestive of progressive breast malignancy.  Progressive metastatic nodal disease and soft tissue nodules.  Stable anterior mediastinal soft tissue lymph nodes.  Decreased sternal activity.  Enlarging right middle lobe 6 mm lung metastases.  Increase in peripancreatic, retroperitoneal, para-aortic, aortocaval lymph nodes conglomerate measures 7.1 cm  Since Keytruda  has been added we will wait to see and repeat another PET CT scan in 3 months.

## 2024-05-21 NOTE — Progress Notes (Signed)
 Orders placed for 1 unit PRBC and premeds per MD. Prepare order released.

## 2024-05-21 NOTE — Progress Notes (Signed)
 Patient Care Team: [name], MD as PCP - General (Family Medicine) Waddell Danelle ORN, MD as PCP - Electrophysiology (Cardiology) Odean Potts, MD as Consulting Physician (Hematology and Oncology) Pickenpack-Cousar, Fannie SAILOR, NP as Nurse Practitioner [facility] and Palliative Medicine)  DIAGNOSIS:  Encounter Diagnosis  Name Primary?   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)  06/30/2011 Initial Diagnosis   Breast cancer, IDC, Left, Stage III, Triple negative, 7.6 cm breast mass and palpable axillary mass   09/06/2011 Miscellaneous   BRCA 1 and 2: Negative   09/14/2011 - 11/23/2011 Neo-Adjuvant Chemotherapy   Dose dense FEC followed by dose dense Taxotere    12/21/2011 Surgery   Left lumpectomy: High-grade poorly differentiated IDC 1.7 cm with high-grade DCIS, margins negative, 3/7 lymph nodes positive, ER 0%, PR 0%, HER-2 negative ratio 1.33 T1CN1 stage IIb   12/31/2011 - 02/15/2012 Radiation Therapy   [facility]   09/26/2017 Relapse/Recurrence   Left breast upper outer quadrant within the lumpectomy bed: Fibrosis no malignancy, right axillary lymph node biopsy metastatic high-grade carcinoma ER 0%, PR 0%, Ki-67 90%, HER-2 negative ratio 1.11 (similar to previous ductal carcinoma)   11/14/2017 - 03/06/2018 Neo-Adjuvant Chemotherapy   Neo-Adjuvant chemotherapy with Gemzar  and carboplatin  days 1 and 8 every 3 weeks   01/10/2018 Genetic Testing   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  The Hereditary Gene Panel offered by [facility] includes sequencing and/or deletion duplication testing of the following 47 genes: APC, ATM, AXIN2, BARD1, BMPR1A, BRCA1, BRCA2, BRIP1, CDH1, CDK4, CDKN2A (p14ARF), CDKN2A (p16INK4a), CHEK2, CTNNA1, DICER1, EPCAM (Deletion/duplication testing  only), GREM1 (promoter region deletion/duplication testing only), KIT, MEN1, MLH1, MSH2, MSH3, MSH6, MUTYH, NBN, NF1, NHTL1, PALB2, PDGFRA, PMS2, POLD1, POLE, PTEN, RAD50, RAD51C, RAD51D, SDHB, SDHC, SDHD, SMAD4, SMARCA4. STK11, TP53, TSC1, TSC2, and VHL.  The following genes were evaluated for sequence changes only: SDHA and HOXB13 c.251G>A variant only. The report date is January 10, 2018.    05/31/2023 Procedure   Mediastinal mass biopsy: Metastatic poorly differentiated carcinoma compatible with breast primary ER +60%, PR 0%, HER2 0, Ki-67 40%    06/02/2023 PET scan   PET/CT Anterior mediastinal mass is hypermetabolic and centrally necrotic, soft tissue lesions anterior to the heart, 10 mm pleural-based lesion, level 2 cervical nodes, hypermetabolism hepatic dome mottled FDG accumulation throughout the skeletal system    06/06/2023 - 07/14/2023 Anti-estrogen oral therapy   Antiestrogen therapy with Verzenio  and letrozole     07/07/2023 Relapse/Recurrence   Increasing mediastinal mass 11.6 cm (used to be 10.8 cm) effacing the heart and involving the sternum, additional mediastinal/pericardial lymph nodes increased right extra lymph node 1.5 cm, nodules 0.2 cm (used to be 0.8 cm), few new scattered lung nodules)   07/25/2023 -  Chemotherapy   Patient is on Treatment Plan : BREAST METASTATIC Sacituzumab govitecan -hziy (Trodelvy ) D1,8 q21d     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)  04/06/2018 Surgery   Right axillary lymph node dissection: 1/11 node positive for metastatic high-grade carcinoma   04/25/2018 Initial Diagnosis   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)   04/25/2018 Cancer Staging   Staging form: Breast, AJCC 8th Edition - Clinical: Stage IIB (cT0, cN1, cM0, G3, ER-, PR-, HER2-) - Signed by Izell Domino, MD on 04/25/2018   05/16/2018 - 06/22/2018 Radiation Therapy  Adjuvant radiation therapy with Xeloda    07/11/2018 - 10/24/2018 Chemotherapy   Adjuvant  chemotherapy with CM[facility]     CHIEF COMPLIANT: Follow-up of labs to discuss her treatment plan and to review the PET CT scan  HISTORY OF PRESENT ILLNESS:   History of Present Illness Taygen Newsome is a 53 year old female with metastatic cancer who presents for follow-up after a PET scan.  The recent PET scan shows growth on the cancer side with increased activity in the chest wall and prominent lymph nodes in the chest and abdominal areas, particularly around the head of the pancreas. The abdominal lymph nodes have increased in size since February.  She is currently undergoing immunotherapy after sacituzumab govitecan  (Trodelvy ) proved ineffective. She is concerned about the proximity of the lymph nodes to her pancreas.  Recent lab results indicate significant bone marrow suppression with a white blood cell count of 1.0, platelets at 81, and hemoglobin at 7.6. She received her last infusion three weeks ago along with a Neulasta  shot.  She experiences low energy levels and is considering a blood transfusion despite apprehension due to family experiences.     ALLERGIES:  is allergic to codeine and hydrocodone-acetaminophen .  MEDICATIONS:  Current Outpatient Medications  Medication Sig Dispense Refill   acetaminophen  (TYLENOL ) 500 MG tablet Take 500 mg by mouth every 6 (six) hours as needed. For pain     albuterol  (ACCUNEB ) 0.63 MG/3ML nebulizer solution Take 3 mLs (0.63 mg total) by nebulization every 6 (six) hours as needed for wheezing. 75 mL 12   amLODipine  (NORVASC ) 5 MG tablet Take 10 mg by mouth daily.  6   Cyanocobalamin  (VITAMIN B-12) 5000 MCG TBDP Take 5,000 mcg by mouth daily.     diphenoxylate -atropine  (LOMOTIL ) 2.5-0.025 MG tablet Take 1-2 tablets by mouth 4 (four) times daily as needed for diarrhea or loose stools. 60 tablet 0   ibuprofen  (ADVIL ) 800 MG tablet TAKE 1 TABLET BY MOUTH EVERY 8 HOURS AS NEEDED 30 tablet 0   lidocaine -prilocaine  (EMLA ) cream Apply to  affected area once 30 g 3   loratadine  (CLARITIN ) 10 MG tablet Take 1 tablet (10 mg total) by mouth daily as needed for allergies. 90 tablet 2   LORazepam  (ATIVAN ) 1 MG tablet Take 1 tablet (1 mg total) by mouth every 8 (eight) hours. 10 tablet 0   magnesium  oxide (MAG-OX) 400 MG tablet TAKE 1 TABLET BY MOUTH EVERY DAY 30 tablet 1   metoprolol  succinate (TOPROL  XL) 25 MG 24 hr tablet Take 1 tablet (25 mg total) by mouth daily. 90 tablet 3   Omega-3 Fatty Acids (FISH OIL MAXIMUM STRENGTH) 1200 MG CPDR Take 1,200 mg by mouth daily.     ondansetron  (ZOFRAN ) 8 MG tablet Take 1 tablet (8 mg total) by mouth 2 (two) times daily. 60 tablet 6   prochlorperazine  (COMPAZINE ) 10 MG tablet Take 1 tablet (10 mg total) by mouth every 6 (six) hours as needed for nausea or vomiting. 30 tablet 6   Vitamin D , Ergocalciferol , (DRISDOL ) 1.25 MG (50000 UNIT) CAPS capsule Take 1 capsule (50,000 Units total) by mouth once a week. 12 capsule 2   No current facility-administered medications for this visit.   Facility-Administered Medications Ordered in Other Visits  Medication Dose Route Frequency Provider Last Rate Last Admin   sodium chloride  flush (NS) 0.9 % injection 10 mL  10 mL Intracatheter PRN Odean Potts, MD   10 mL at 05/08/24 0953    PHYSICAL EXAMINATION: ECOG PERFORMANCE  STATUS: 1 - Symptomatic but completely ambulatory  Vitals:   05/21/24 0925  BP: 119/61  Pulse: 100  Resp: 18  Temp: 97.6 F (36.4 C)  SpO2: 97%   Filed Weights   05/21/24 0925  Weight: 213 lb 3.2 oz (96.7 kg)      LABORATORY DATA:  I have reviewed the data as listed    Latest Ref Rng & Units 05/08/2024    8:29 AM 05/01/2024   10:39 AM 04/09/2024    9:56 AM  CMP  Glucose 70 - 99 mg/dL 98  898  96   BUN 6 - 20 mg/dL 17  20  20    Creatinine 0.44 - 1.00 mg/dL 9.13  9.00  9.04   Sodium 135 - 145 mmol/L 139  138  139   Potassium 3.5 - 5.1 mmol/L 3.5  3.5  3.9   Chloride 98 - 111 mmol/L 104  102  103   CO2 22 - 32 mmol/L 28   28  28    Calcium 8.9 - 10.3 mg/dL 9.2  9.4  9.5   Total Protein 6.5 - 8.1 g/dL 7.0  7.2  7.4   Total Bilirubin 0.0 - 1.2 mg/dL 0.3  0.4  0.4   Alkaline Phos 38 - 126 U/L 88  100  93   AST 15 - 41 U/L 13  14  15    ALT 0 - 44 U/L 7  9  10      Lab Results  Component Value Date   WBC 1.0 (L) 05/21/2024   HGB 7.6 (L) 05/21/2024   HCT 23.0 (L) 05/21/2024   MCV 93.5 05/21/2024   PLT 81 (L) 05/21/2024   NEUTROABS PENDING 05/21/2024    ASSESSMENT & PLAN:  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) 06/30/2011: Left breast cancer stage III triple negative, 7.6 cm 09/14/2011: Dose dense FEC followed by dose dense Taxotere  neoadjuvant chemo 12/21/2011: Left lumpectomy: High-grade poorly differentiated IDC 1.7 cm 3/7 nodes positive triple negative, XRT 09/26/2017: Relapse: Right axillary lymph node biopsy positive for metastatic high-grade carcinoma triple negative 11/14/2017: Neoadjuvant Gemzar  and carboplatin  05/31/2023: Relapse: Mediastinal mass biopsy: Metastatic poorly differentiated carcinoma ER 60%, HER2 negative 06/06/2023: Verzinio with letrozole  07/25/2023: Trodelvy  02/21/2024: Chest wall skin biopsy: Poorly differentiated carcinoma ER 0% PR 0% Ki-67 40%, HER2 0 --------------------------------------------------------------------------------------------------------------------- [facility]is molecular testing: PD-L1 mutation: Adding Keytruda  to Trodelvy  based on clinical trial ASCENT 04 (median PFS 11.2 months versus 7.8 months)   Current Treatment: Trodelvy  and Keytruda  (Keytruda  added on  Toxicities: Anemia: Received Aranesp  without any improvement in fact it is lower.  I recommend blood transfusion which we will try to accommodate tomorrow. Thrombocytopenia:   Neutropenia: Receiving Granix  injections: Because her ANC 0.5, tomorrow we cannot do Trodelvy .  I recommend that she get Granix  injection today tomorrow and next Saturday so that she can get the day 8 of Trodelvy .  After  that we will do Neulasta  injection on day 9.   Shortness of breath and cough Cutaneous metastasis  PET-CT: 05/16/2024: Increased metabolic activity throughout both breast suggestive of progressive breast malignancy.  Progressive metastatic nodal disease and soft tissue nodules.  Stable anterior mediastinal soft tissue lymph nodes.  Decreased sternal activity.  Enlarging right middle lobe 6 mm lung metastases.  Increase in peripancreatic, retroperitoneal, para-aortic, aortocaval lymph nodes conglomerate measures 7.1 cm  Since Keytruda  has been added we will wait to see and repeat another PET CT scan in 3 months.   ------------------------------------- Assessment and Plan Assessment &  Plan Metastatic breast cancer Progression with increased activity on PET scan in chest wall and abdominal lymph nodes. Sacituzumab was ineffective. Symmetrical bilateral breast brightness noted. - Initiate pembrolizumab  to stimulate immune response. - Schedule follow-up PET scan in three months.  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 Discussed transfusion benefits despite personal and family history concerns. - Administer blood transfusion to improve hemoglobin and energy. - Perform type and crossmatch for transfusion. - Administer Neupogen  injections today, tomorrow, and Saturday. - Administer Neulasta  injection on August 6th post-treatment cycle.      No orders of the defined types were placed in this encounter.  The patient has a good understanding of the overall plan. she agrees with it. she will call with any problems that may develop before the next visit here. Total time spent: 45 mins including face to face time and time spent for planning, charting and co-ordination of care   Naomi MARLA Chad, MD 05/21/24

## 2024-05-22 ENCOUNTER — Other Ambulatory Visit: Payer: Self-pay | Admitting: Pharmacist

## 2024-05-22 ENCOUNTER — Other Ambulatory Visit: Payer: Self-pay | Admitting: Hematology and Oncology

## 2024-05-22 ENCOUNTER — Encounter: Payer: Self-pay | Admitting: Hematology and Oncology

## 2024-05-22 ENCOUNTER — Ambulatory Visit

## 2024-05-22 ENCOUNTER — Inpatient Hospital Stay

## 2024-05-22 VITALS — BP 107/71 | HR 93 | Temp 98.7°F | Resp 23

## 2024-05-22 DIAGNOSIS — N6331 Unspecified lump in axillary tail of the right breast: Secondary | ICD-10-CM

## 2024-05-22 DIAGNOSIS — Z171 Estrogen receptor negative status [ER-]: Secondary | ICD-10-CM

## 2024-05-22 DIAGNOSIS — Z95828 Presence of other vascular implants and grafts: Secondary | ICD-10-CM

## 2024-05-22 DIAGNOSIS — C50012 Malignant neoplasm of nipple and areola, left female breast: Secondary | ICD-10-CM

## 2024-05-22 DIAGNOSIS — C50919 Malignant neoplasm of unspecified site of unspecified female breast: Secondary | ICD-10-CM

## 2024-05-22 LAB — T4: T4, Total: 8.3 ug/dL (ref 4.5–12.0)

## 2024-05-22 MED ORDER — SODIUM CHLORIDE 0.9% IV SOLUTION
250.0000 mL | INTRAVENOUS | Status: DC
  Administered 2024-05-22: 100 mL via INTRAVENOUS

## 2024-05-22 MED ORDER — SODIUM CHLORIDE 0.9% FLUSH
10.0000 mL | INTRAVENOUS | Status: DC | PRN
Start: 2024-05-22 — End: 2024-05-22

## 2024-05-22 MED ORDER — DIPHENHYDRAMINE HCL 25 MG PO CAPS
25.0000 mg | ORAL_CAPSULE | Freq: Once | ORAL | Status: AC
  Administered 2024-05-22: 25 mg via ORAL
  Filled 2024-05-22: qty 1

## 2024-05-22 MED ORDER — HEPARIN SOD (PORK) LOCK FLUSH 100 UNIT/ML IV SOLN
500.0000 [IU] | Freq: Once | INTRAVENOUS | Status: AC | PRN
  Administered 2024-05-22: 500 [IU]

## 2024-05-22 MED ORDER — HEPARIN SOD (PORK) LOCK FLUSH 100 UNIT/ML IV SOLN: 250.0000 [IU] | INTRAVENOUS | Status: DC | PRN

## 2024-05-22 MED ORDER — FILGRASTIM-SNDZ 480 MCG/0.8ML IJ SOSY
480.0000 ug | PREFILLED_SYRINGE | Freq: Once | INTRAMUSCULAR | Status: AC
  Administered 2024-05-22: 480 ug via SUBCUTANEOUS
  Filled 2024-05-22: qty 0.8

## 2024-05-22 MED ORDER — SODIUM CHLORIDE 0.9 % IV SOLN: Freq: Once | INTRAVENOUS | Status: AC

## 2024-05-22 MED ORDER — SODIUM CHLORIDE 0.9% FLUSH
10.0000 mL | INTRAVENOUS | Status: AC | PRN
  Administered 2024-05-22: 10 mL

## 2024-05-22 MED ORDER — SODIUM CHLORIDE 0.9 % IV SOLN
200.0000 mg | Freq: Once | INTRAVENOUS | Status: AC
  Administered 2024-05-22: 200 mg via INTRAVENOUS
  Filled 2024-05-22: qty 200

## 2024-05-22 MED ORDER — ACETAMINOPHEN 325 MG PO TABS
650.0000 mg | ORAL_TABLET | Freq: Once | ORAL | Status: AC
  Administered 2024-05-22: 650 mg via ORAL
  Filled 2024-05-22: qty 2

## 2024-05-22 NOTE — Patient Instructions (Signed)
 Blood Transfusion, Adult A blood transfusion is a procedure in which you receive blood or a type of blood cell (blood component) through an IV. You may need a blood transfusion when you have a low blood count, which is a low number of any blood cell. This may result from a bleeding disorder, illness, injury, or surgery. The blood may come from a donor, or you may be able to have your own blood collected and stored (autologous blood donation) before a planned surgery. The blood given in a transfusion may be made up of different blood components. You may receive: Red blood cells. These carry oxygen to the cells in the body. Platelets. These help your blood to clot. Plasma. This is the liquid part of your blood. It carries proteins and other substances throughout the body. White blood cells. These help you fight infections. If you have hemophilia or another clotting disorder, you may also receive other types of blood products. Depending on the type of blood product, this procedure may take 1-4 hours to complete. Tell a health care provider about: Any bleeding problems you have. Any previous reactions you have had during a blood transfusion. Any allergies you have. All medicines you are taking, including vitamins, herbs, eye drops, creams, and over-the-counter medicines. Any surgeries you have had. Any medical conditions you have. Whether you are pregnant or may be pregnant. What are the risks? Talk with your health care provider about risks. The most common problems include: A mild allergic reaction, such as red, swollen areas of skin (hives) and itching. Fever or chills. This may be the body's response to new blood cells received. This may occur during or up to 4 hours after the transfusion. More serious problems may include: A serious allergic reaction that causes difficulty breathing or swelling around the face and lips. Transfusion-associated circulatory overload (TACO), or too much fluid in  the lungs. This may cause breathing problems. Transfusion-related acute lung injury (TRALI), which causes breathing difficulty and low oxygen in the blood. This can occur within hours of the transfusion or several days later. Iron overload. This can happen after receiving many blood transfusions over a period of time. Infection or virus being transmitted. This is rare because donated blood is carefully tested before it is given. Hemolytic transfusion reaction. This is rare. It happens when the body's defense system (immune system)tries to attack the new blood cells. Symptoms may include fever, chills, nausea, low blood pressure, and low back or chest pain. Transfusion-associated graft-versus-host disease (TAGVHD). This is rare. It happens when donated cells attack the body's healthy tissues. What happens before the procedure? You will have a blood test to check your blood type. This test is done to know what kind of blood your body will accept and to match it to the donor blood. If you are going to have a planned surgery, you may be able to do an autologous blood donation. This may be done in case you need to have a transfusion. You will have your temperature, blood pressure, and pulse checked before the transfusion. If you have had an allergic reaction to a transfusion in the past, you may be given medicine to help prevent a reaction. This medicine may be given to you by mouth (orally) or through an IV. What happens during the procedure?  An IV will be inserted into one of your veins. The bag of blood will be attached to your IV. The blood will then enter through your vein. Your temperature, blood pressure,  and pulse will be monitored during the transfusion. This monitoring is done to detect early signs of a transfusion reaction. Tell your nurse right away if you have any of these symptoms during the transfusion: Shortness of breath or trouble breathing. Chest or back pain. Fever or  chills. Itching or hives. If you have any signs or symptoms of a reaction, your transfusion will be stopped and you may be given medicine. When the transfusion is complete, your IV will be removed. Pressure may be applied to the IV site for a few minutes. A bandage (dressing)will be applied. The procedure may vary among health care providers and hospitals. What happens after the procedure? Your temperature, blood pressure, pulse, breathing rate, and blood oxygen level will be monitored until you leave the hospital or clinic. Your blood may be tested to see how you have responded to the transfusion. You may be warmed with fluids or blankets to maintain a normal body temperature. If you receive your blood transfusion in an outpatient setting, you will be told whom to contact to report any reactions. Where to find more information Visit the [employer]: redcross.org Summary A blood transfusion is a procedure in which you receive blood or a type of blood cell (blood component) through an IV. The blood given in a transfusion may be made up of different blood components. You may receive red blood cells, platelets, plasma, or white blood cells depending on the condition treated. Your temperature, blood pressure, and pulse will be monitored before, during, and after the transfusion. After the transfusion, your blood may be tested to see how your body has responded. This information is not intended to replace advice given to you by your health care provider. Make sure you discuss any questions you have with your health care provider.  [facility] - A DEPT OF [facility]  Discharge Instructions: Thank you for choosing [facility] to provide your oncology and hematology care.   If you have a lab appointment with the Cancer Center, please go directly to the Cancer Center and check in at the registration area.   Wear comfortable clothing and clothing  appropriate for easy access to any Portacath or PICC line.   We strive to give you quality time with your provider. You may need to reschedule your appointment if you arrive late (15 or more minutes).  Arriving late affects you and other patients whose appointments are after yours.  Also, if you miss three or more appointments without notifying the office, you may be dismissed from the clinic at the provider's discretion.      For prescription refill requests, have your pharmacy contact our office and allow 72 hours for refills to be completed.    Today you received the following chemotherapy and/or immunotherapy agents: Keytruda       To help prevent nausea and vomiting after your treatment, we encourage you to take your nausea medication as directed.  BELOW ARE SYMPTOMS THAT SHOULD BE REPORTED IMMEDIATELY: *FEVER GREATER THAN 100.4 F (38 C) OR HIGHER *CHILLS OR SWEATING *NAUSEA AND VOMITING THAT IS NOT CONTROLLED WITH YOUR NAUSEA MEDICATION *UNUSUAL SHORTNESS OF BREATH *UNUSUAL BRUISING OR BLEEDING *URINARY PROBLEMS (pain or burning when urinating, or frequent urination) *BOWEL PROBLEMS (unusual diarrhea, constipation, pain near the anus) TENDERNESS IN MOUTH AND THROAT WITH OR WITHOUT PRESENCE OF ULCERS (sore throat, sores in mouth, or a toothache) UNUSUAL RASH, SWELLING OR PAIN  UNUSUAL VAGINAL DISCHARGE OR  ITCHING   Items with * indicate a potential emergency and should be followed up as soon as possible or go to the Emergency Department if any problems should occur.  Please show the CHEMOTHERAPY ALERT CARD or IMMUNOTHERAPY ALERT CARD at check-in to the Emergency Department and triage nurse.  Should you have questions after your visit or need to cancel or reschedule your appointment, please contact [facility] - A DEPT OF [facility]  Dept: [phone]  and follow the prompts.  Office hours are 8:00 a.m. to 4:30 p.m. Monday - Friday. Please note that  voicemails left after 4:00 p.m. may not be returned until the following business day.  We are closed weekends and major holidays. You have access to a nurse at all times for urgent questions. Please call the main number to the clinic Dept: [phone] and follow the prompts.   For any non-urgent questions, you may also contact your provider using MyChart. We now offer e-Visits for anyone 21 and older to request care online for non-urgent symptoms. For details visit [email or web address].   Also download the MyChart app! Go to the app store, search MyChart, open the app, select [facility], and log in with your MyChart username and password.   Document Revised: 01/08/2022 Document Reviewed: 01/08/2022 Elsevier Patient Education  2024 [employer].

## 2024-05-23 LAB — TYPE AND SCREEN
ABO/RH(D): O POS
Antibody Screen: NEGATIVE
Unit division: 0

## 2024-05-23 LAB — BPAM RBC
Blood Product Expiration Date: 202508252359
ISSUE DATE / TIME: 202507290954
Unit Type and Rh: 202508252359
Unit Type and Rh: 5100

## 2024-05-24 ENCOUNTER — Encounter: Payer: Self-pay | Admitting: *Deleted

## 2024-05-24 ENCOUNTER — Encounter: Payer: Self-pay | Admitting: Hematology and Oncology

## 2024-05-25 ENCOUNTER — Inpatient Hospital Stay

## 2024-05-26 ENCOUNTER — Ambulatory Visit

## 2024-05-26 ENCOUNTER — Inpatient Hospital Stay: Attending: Hematology and Oncology

## 2024-05-26 VITALS — BP 134/75 | HR 98 | Temp 97.5°F | Resp 20

## 2024-05-26 DIAGNOSIS — C7801 Secondary malignant neoplasm of right lung: Secondary | ICD-10-CM | POA: Insufficient documentation

## 2024-05-26 DIAGNOSIS — Z9221 Personal history of antineoplastic chemotherapy: Secondary | ICD-10-CM | POA: Diagnosis not present

## 2024-05-26 DIAGNOSIS — D6481 Anemia due to antineoplastic chemotherapy: Secondary | ICD-10-CM | POA: Insufficient documentation

## 2024-05-26 DIAGNOSIS — Z79899 Other long term (current) drug therapy: Secondary | ICD-10-CM | POA: Diagnosis not present

## 2024-05-26 DIAGNOSIS — D709 Neutropenia, unspecified: Secondary | ICD-10-CM | POA: Diagnosis not present

## 2024-05-26 DIAGNOSIS — Z95828 Presence of other vascular implants and grafts: Secondary | ICD-10-CM

## 2024-05-26 DIAGNOSIS — T451X5A Adverse effect of antineoplastic and immunosuppressive drugs, initial encounter: Secondary | ICD-10-CM | POA: Diagnosis not present

## 2024-05-26 DIAGNOSIS — Z171 Estrogen receptor negative status [ER-]: Secondary | ICD-10-CM | POA: Insufficient documentation

## 2024-05-26 DIAGNOSIS — C792 Secondary malignant neoplasm of skin: Secondary | ICD-10-CM | POA: Insufficient documentation

## 2024-05-26 DIAGNOSIS — Z1721 Progesterone receptor positive status: Secondary | ICD-10-CM | POA: Insufficient documentation

## 2024-05-26 DIAGNOSIS — Z5112 Encounter for antineoplastic immunotherapy: Secondary | ICD-10-CM | POA: Insufficient documentation

## 2024-05-26 DIAGNOSIS — Z5189 Encounter for other specified aftercare: Secondary | ICD-10-CM | POA: Diagnosis not present

## 2024-05-26 DIAGNOSIS — R197 Diarrhea, unspecified: Secondary | ICD-10-CM | POA: Insufficient documentation

## 2024-05-26 DIAGNOSIS — R5383 Other fatigue: Secondary | ICD-10-CM | POA: Diagnosis not present

## 2024-05-26 DIAGNOSIS — Z923 Personal history of irradiation: Secondary | ICD-10-CM | POA: Diagnosis not present

## 2024-05-26 DIAGNOSIS — D649 Anemia, unspecified: Secondary | ICD-10-CM | POA: Diagnosis not present

## 2024-05-26 DIAGNOSIS — D696 Thrombocytopenia, unspecified: Secondary | ICD-10-CM | POA: Insufficient documentation

## 2024-05-26 DIAGNOSIS — C771 Secondary and unspecified malignant neoplasm of intrathoracic lymph nodes: Secondary | ICD-10-CM | POA: Insufficient documentation

## 2024-05-26 DIAGNOSIS — C50012 Malignant neoplasm of nipple and areola, left female breast: Secondary | ICD-10-CM | POA: Insufficient documentation

## 2024-05-26 DIAGNOSIS — R059 Cough, unspecified: Secondary | ICD-10-CM | POA: Diagnosis not present

## 2024-05-26 DIAGNOSIS — Z79811 Long term (current) use of aromatase inhibitors: Secondary | ICD-10-CM | POA: Insufficient documentation

## 2024-05-26 DIAGNOSIS — R0602 Shortness of breath: Secondary | ICD-10-CM | POA: Diagnosis not present

## 2024-05-26 DIAGNOSIS — Z1732 Human epidermal growth factor receptor 2 negative status: Secondary | ICD-10-CM | POA: Diagnosis not present

## 2024-05-26 MED ORDER — FILGRASTIM-SNDZ 480 MCG/0.8ML IJ SOSY
480.0000 ug | PREFILLED_SYRINGE | Freq: Once | INTRAMUSCULAR | Status: AC
  Administered 2024-05-26: 480 ug via SUBCUTANEOUS

## 2024-05-28 MED FILL — Fosaprepitant Dimeglumine For IV Infusion 150 MG (Base Eq): INTRAVENOUS | Qty: 5 | Status: AC

## 2024-05-29 ENCOUNTER — Inpatient Hospital Stay

## 2024-05-29 VITALS — BP 134/68 | HR 100 | Temp 98.3°F | Resp 18 | Ht 66.0 in | Wt 211.8 lb

## 2024-05-29 DIAGNOSIS — C50919 Malignant neoplasm of unspecified site of unspecified female breast: Secondary | ICD-10-CM

## 2024-05-29 DIAGNOSIS — Z95828 Presence of other vascular implants and grafts: Secondary | ICD-10-CM

## 2024-05-29 DIAGNOSIS — C50012 Malignant neoplasm of nipple and areola, left female breast: Secondary | ICD-10-CM | POA: Diagnosis not present

## 2024-05-29 DIAGNOSIS — N6331 Unspecified lump in axillary tail of the right breast: Secondary | ICD-10-CM

## 2024-05-29 LAB — CBC WITH DIFFERENTIAL (CANCER CENTER ONLY)
Abs Immature Granulocytes: 0.15 K/uL — ABNORMAL HIGH (ref 0.00–0.07)
Basophils Absolute: 0.1 K/uL (ref 0.0–0.1)
Basophils Relative: 2 %
Eosinophils Absolute: 0.1 K/uL (ref 0.0–0.5)
Eosinophils Relative: 2 %
HCT: 27.5 % — ABNORMAL LOW (ref 36.0–46.0)
Hemoglobin: 9.3 g/dL — ABNORMAL LOW (ref 12.0–15.0)
Immature Granulocytes: 5 %
Lymphocytes Relative: 10 %
Lymphs Abs: 0.3 K/uL — ABNORMAL LOW (ref 0.7–4.0)
MCH: 31 pg (ref 26.0–34.0)
MCHC: 33.8 g/dL (ref 30.0–36.0)
MCV: 91.7 fL (ref 80.0–100.0)
Monocytes Absolute: 0.3 K/uL (ref 0.1–1.0)
Monocytes Relative: 10 %
Neutro Abs: 2.2 K/uL (ref 1.7–7.7)
Neutrophils Relative %: 71 %
Platelet Count: 69 K/uL — ABNORMAL LOW (ref 150–400)
RBC: 3 MIL/uL — ABNORMAL LOW (ref 3.87–5.11)
RDW: 16 % — ABNORMAL HIGH (ref 11.5–15.5)
WBC Count: 3.1 K/uL — ABNORMAL LOW (ref 4.0–10.5)
nRBC: 0 % (ref 0.0–0.2)

## 2024-05-29 LAB — CMP (CANCER CENTER ONLY)
ALT: 9 U/L (ref 0–44)
AST: 14 U/L — ABNORMAL LOW (ref 15–41)
Albumin: 4.1 g/dL (ref 3.5–5.0)
Alkaline Phosphatase: 103 U/L (ref 38–126)
Anion gap: 7 (ref 5–15)
BUN: 16 mg/dL (ref 6–20)
CO2: 29 mmol/L (ref 22–32)
Calcium: 9.3 mg/dL (ref 8.9–10.3)
Chloride: 101 mmol/L (ref 98–111)
Creatinine: 0.92 mg/dL (ref 0.44–1.00)
GFR, Estimated: >60 mL/min (ref >60)
Glucose, Bld: 120 mg/dL — ABNORMAL HIGH (ref 70–99)
Potassium: 3.6 mmol/L (ref 3.5–5.1)
Sodium: 137 mmol/L (ref 135–145)
Total Bilirubin: 0.5 mg/dL (ref 0.0–1.2)
Total Protein: 7.7 g/dL (ref 6.5–8.1)

## 2024-05-29 LAB — PHOSPHORUS: Phosphorus: 3.7 mg/dL (ref 2.5–4.6)

## 2024-05-29 LAB — SAMPLE TO BLOOD BANK

## 2024-05-29 LAB — MAGNESIUM: Magnesium: 1.7 mg/dL (ref 1.7–2.4)

## 2024-05-29 MED ORDER — SODIUM CHLORIDE 0.9% FLUSH
10.0000 mL | Freq: Once | INTRAVENOUS | Status: AC
  Administered 2024-05-29: 10 mL

## 2024-05-29 MED ORDER — DARBEPOETIN ALFA 300 MCG/0.6ML IJ SOSY
300.0000 ug | PREFILLED_SYRINGE | Freq: Once | INTRAMUSCULAR | Status: AC
  Administered 2024-05-29: 300 ug via SUBCUTANEOUS
  Filled 2024-05-29: qty 0.6

## 2024-05-29 NOTE — Patient Instructions (Addendum)
 Thrombocytopenia Thrombocytopenia is a condition in which there are a low number of platelets in the blood. Platelets are also called thrombocytes. Platelets are parts of blood that stick together and form a clot to help the body stop bleeding after an injury. If you have too few platelets, your blood may have trouble clotting. This may cause you to bleed and bruise very easily. Some cases of thrombocytopenia are mild while others are more severe. What are the causes? This condition is caused by a low number of platelets in your blood. There are three main reasons for this: Your body not making enough platelets. This may be caused by: Bone marrow diseases. This include aplastic anemia, leukemia, and myelodysplastic anemia. Congenital thrombocytopenia. This is a condition that is passed from parent to child (inherited). Certain cancer treatments, including chemotherapy and radiation therapy. Infections from bacteria or viruses. Alcohol use disorder and alcoholism. Platelets not being released in the blood. This is called platelet sequestration and it can happen due to: An overactive spleen (hypersplenism). The spleen gathers up platelets from circulation, meaning that the platelets are not available to help with clotting your blood. The spleen can be enlarged because of scarring or other conditions. Gaucher disease. Your body destroying platelets too quickly. This may be caused by: An autoimmune disease that causes immune thrombocytopenia (ITP). ITP is sometimes associated with other autoimmune conditions such as lupus. Certain medicines, such as blood thinners. Certain blood clotting or bleeding disorders. Exposure to toxic chemicals, such as pesticides, lead, benzene, and arsenic. Pregnancy. What are the signs or symptoms? Symptoms of this condition are the result of poor blood clotting. They will vary depending on how low the platelet counts are. Symptoms may include: Bruising  easily. Bleeding from the mouth or nose. Heavy menstrual periods. Blood in the urine, stool (feces), or vomit. Purplish-red discolorations on the skin (purpura). A rash that looks like pinpoint, purplish-red spots (petechiae) on the lower legs. How is this diagnosed?  This condition may be diagnosed with blood tests and a physical exam. You may also have other tests, including: A sample of bone marrow (biopsy) may be removed to look for the original cells that make platelets. An ultrasound or CT scan of the abdomen to check for an enlarged spleen, enlarged lymph nodes, or liver problems. How is this treated? Treatment for this condition depends on the cause. Treatment may include: Treatment of another condition that is causing the low platelet count. Medicines to help protect your platelets from being destroyed. A replacement (transfusion) of platelets to stop or prevent bleeding. Surgery to remove the spleen. Follow these instructions at home: Medicines Take over-the-counter and prescription medicines only as told by your health care provider. Do not take any medicines that contain aspirin or NSAIDs, such as ibuprofen . These medicines increase your risk for dangerous bleeding. Activity Avoid activities that could cause injury or bruising, and follow instructions about how to prevent falls. Do not play contact sports. Ask your health care provider what activities are safe for you. Take extra care to protect yourself from burns when ironing or cooking. Take extra care not to cut yourself when you shave or when you use scissors, needles, knives, and other tools. General instructions  Check your skin and the inside of your mouth for bruising or bleeding as told by your health care provider. Wear a medical alert bracelet that says that you have a bleeding disorder. This can help you get the treatment you need in case of emergency. Check  your urine and stool for blood as told by your health  care provider. Do not drink alcohol. If you do drink alcohol, limit the amount that you drink. Minimize contact with toxic chemicals. Tell all your health care providers, including dental care providers and eye doctors, about your condition. Make sure to tell dental care providers before you have any procedure done, including dental cleanings. Keep all follow-up visits. This is important. Contact a health care provider if: You have unexplained bruising. You have new symptoms. You have symptoms that get worse. You have a fever. Get help right away if: You have severe bleeding from anywhere on your body. You have blood in your vomit, urine, or stool. You have an injury to your head. You have a sudden, severe headache. Summary Thrombocytopenia is a condition in which you have a low number of platelets in the blood. Platelets are parts of blood that stick together to form a clot. Symptoms of this condition are the result of poor blood clotting and may include bruising easily, bleeding from the nose or mouth, petechiae, and purpura. This condition may be diagnosed with blood tests and a physical exam. Treatment for this condition depends on the cause. This information is not intended to replace advice given to you by your health care provider. Make sure you discuss any questions you have with your health care provider.  Darbepoetin Alfa  Injection What is this medication? DARBEPOETIN ALFA  (dar be POE e tin AL fa) treats low levels of red blood cells (anemia) caused by kidney disease or chemotherapy. It works by Systems analyst make more red blood cells, which reduces the need for blood transfusions. This medicine may be used for other purposes; ask your health care provider or pharmacist if you have questions. COMMON BRAND NAME(S): Aranesp  What should I tell my care team before I take this medication? They need to know if you have any of these conditions: Blood clots Cancer Heart  disease High blood pressure On dialysis Seizures Stroke An unusual or allergic reaction to darbepoetin, latex, other medications, foods, dyes, or preservatives Pregnant or trying to get pregnant Breast-feeding How should I use this medication? This medication is injected into a vein or under the skin. It is usually given by a care team in a hospital or clinic setting. It may also be given at home. If you get this medication at home, you will be taught how to prepare and give it. Use exactly as directed. Take it as directed on the prescription label at the same time every day. Keep taking it unless your care team tells you to stop. It is important that you put your used needles and syringes in a special sharps container. Do not put them in a trash can. If you do not have a sharps container, call your pharmacist or care team to get one. A special MedGuide will be given to you by the pharmacist with each prescription and refill. Be sure to read this information carefully each time. Talk to your care team about the use of this medication in children. While this medication may be used in children as young as 1 month of age for selected conditions, precautions do apply. Overdosage: If you think you have taken too much of this medicine contact a poison control center or emergency room at once. NOTE: This medicine is only for you. Do not share this medicine with others. What if I miss a dose? If you miss a dose, take it as soon  as you can. If it is almost time for your next dose, take only that dose. Do not take double or extra doses. What may interact with this medication? Epoetin alfa Methoxy polyethylene glycol-epoetin beta This list may not describe all possible interactions. Give your health care provider a list of all the medicines, herbs, non-prescription drugs, or dietary supplements you use. Also tell them if you smoke, drink alcohol, or use illegal drugs. Some items may interact with your  medicine. What should I watch for while using this medication? Visit your care team for regular checks on your progress. Check your blood pressure as directed. Know what your blood pressure should be and when to contact your care team. Your condition will be monitored carefully while you are receiving this medication. You may need blood work while taking this medication. What side effects may I notice from receiving this medication? Side effects that you should report to your care team as soon as possible: Allergic reactions--skin rash, itching, hives, swelling of the face, lips, tongue, or throat Blood clot--pain, swelling, or warmth in the leg, shortness of breath, chest pain Heart attack--pain or tightness in the chest, shoulders, arms, or jaw, nausea, shortness of breath, cold or clammy skin, feeling faint or lightheaded Increase in blood pressure Rash, fever, and swollen lymph nodes Redness, blistering, peeling, or loosening of the skin, including inside the mouth Seizures Stroke--sudden numbness or weakness of the face, arm, or leg, trouble speaking, confusion, trouble walking, loss of balance or coordination, dizziness, severe headache, change in vision Side effects that usually do not require medical attention (report to your care team if they continue or are bothersome): Cough Stomach pain Swelling of the ankles, hands, or feet This list may not describe all possible side effects. Call your doctor for medical advice about side effects. You may report side effects to FDA at 1-800-FDA-1088. Where should I keep my medication? Keep out of the reach of children and pets. Store in a refrigerator. Do not freeze. Do not shake. Protect from light. Keep this medication in the original container until you are ready to take it. See product for storage information. Get rid of any unused medication after the expiration date. To get rid of medications that are no longer needed or have expired: Take  the medication to a medication take-back program. Check with your pharmacy or law enforcement to find a location. If you cannot return the medication, ask your pharmacist or care team how to get rid of the medication safely. NOTE: This sheet is a summary. It may not cover all possible information. If you have questions about this medicine, talk to your doctor, pharmacist, or health care provider.  2024 Elsevier/Gold Standard (2022-02-10 00:00:00) Document Revised: 03/26/2021 Document Reviewed: 03/26/2021 Elsevier Patient Education  2024 [employer].

## 2024-05-29 NOTE — Progress Notes (Signed)
 Per Dr. Gudena, no Trodelvy  today due to Plt 69.  Patient to receive Aranesp  for Hgb 9.3.

## 2024-05-30 ENCOUNTER — Inpatient Hospital Stay

## 2024-06-01 NOTE — Progress Notes (Shared)
 Radiation Oncology         [phone]) [phone] ________________________________  Name: Jessica Simpson MRN: [id number]  Date: 06/05/2024  DOB: 1971/05/09  Follow-Up Visit Note  Outpatient  CC: Jessica Clem Pitts, MD  Jessica Simpson, *  Diagnosis and Prior Radiotherapy:    ICD-10-CM   1. 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 S/p: right axillary lymph node dissection, radiation, and adjuvant chemotherapy now with skin metastases, s/p adjuvant radiation completed on 04/30/2024  History of left breast cancer diagnosed in 2012: s/p neoadjuvant FEC-Taxotere , lumpectomy, and radiation   CHIEF COMPLAINT: Here for follow-up and surveillance of skin metastases from triple negative breast cancer.  Narrative:  The patient returns today for routine follow-up. She completed her treatment approximately 1 month ago.   ***                              ALLERGIES:  is allergic to codeine and hydrocodone-acetaminophen .  Meds: Current Outpatient Medications  Medication Sig Dispense Refill   acetaminophen  (TYLENOL ) 500 MG tablet Take 500 mg by mouth every 6 (six) hours as needed. For pain     albuterol  (ACCUNEB ) 0.63 MG/3ML nebulizer solution Take 3 mLs (0.63 mg total) by nebulization every 6 (six) hours as needed for wheezing. 75 mL  12   amLODipine  (NORVASC ) 5 MG tablet Take 10 mg by mouth daily.  6   Cyanocobalamin  (VITAMIN B-12) 5000 MCG TBDP Take 5,000 mcg by mouth daily.     diphenoxylate -atropine  (LOMOTIL ) 2.5-0.025 MG tablet Take 1-2 tablets by mouth 4 (four) times daily as needed for diarrhea or loose stools. 60 tablet 0   ibuprofen  (ADVIL ) 800 MG tablet TAKE 1 TABLET BY MOUTH EVERY 8 HOURS AS NEEDED 30 tablet 0   lidocaine -prilocaine  (EMLA ) cream Apply to affected area once 30 g 3   loratadine  (CLARITIN ) 10 MG tablet Take 1 tablet (10 mg total) by mouth daily as needed for allergies. 90 tablet 2   LORazepam  (ATIVAN ) 1 MG tablet Take 1 tablet (1 mg total) by mouth every 8 (eight) hours. 10 tablet 0   magnesium  oxide (MAG-OX) 400 MG tablet TAKE 1 TABLET BY MOUTH EVERY DAY 30 tablet 1   metoprolol  succinate (TOPROL  XL) 25 MG 24 hr tablet Take 1 tablet (25 mg total) by mouth daily. 90 tablet 3   Omega-3 Fatty Acids (FISH OIL MAXIMUM STRENGTH) 1200 MG CPDR Take 1,200 mg by mouth daily.     ondansetron  (ZOFRAN ) 8 MG tablet Take 1 tablet (8 mg total) by mouth 2 (two) times daily. 60 tablet 6   prochlorperazine  (COMPAZINE ) 10 MG tablet Take 1 tablet (10 mg total) by mouth every 6 (six) hours as needed for nausea  or vomiting. 30 tablet 6   Vitamin D , Ergocalciferol , (DRISDOL ) 1.25 MG (50000 UNIT) CAPS capsule Take 1 capsule (50,000 Units total) by mouth once a week. 12 capsule 2   No current facility-administered medications for this encounter.   Facility-Administered Medications Ordered in Other Encounters  Medication Dose Route Frequency Provider Last Rate Last Admin   sodium chloride  flush (NS) 0.9 % injection 10 mL  10 mL Intracatheter PRN Jessica Potts, MD   10 mL at 05/08/24 9046    Physical Findings: The patient is in no acute distress. Patient is alert and oriented.  height is 5' 6 (1.676 m) and weight is 211 lb 4 oz (95.8 kg). Her temporal temperature is 97.5 F (36.4 C) (abnormal). Her blood pressure is 132/78  and her pulse is 107 (abnormal). Her respiration is 20 and oxygen saturation is 100%. .     In general this is a well appearing female in no acute distress. She's alert and oriented x4 and appropriate throughout the examination. Cardiopulmonary assessment is negative for acute distress and she exhibits normal effort.     Satisfactory skin healing in radiotherapy fields. Previously treated nodules have resolved or gotten much smaller in size.   Subcutaneous nodules in the right upper quadrant extending to the right inframammary fold and in the bilateral lateral chest. (See below)          Lab Findings: Lab Results  Component Value Date   WBC 3.1 (L) 05/29/2024   HGB 9.3 (L) 05/29/2024   HCT 27.5 (L) 05/29/2024   MCV 91.7 05/29/2024   PLT 69 (L) 05/29/2024    Radiographic Findings: NM PET Image Restag (PS) Skull Base To Thigh Result Date: 05/16/2024 CLINICAL DATA:  Subsequent treatment strategy for metastatic breast cancer status post bilateral breast lumpectomy and axillary nodal dissection. EXAM: NUCLEAR MEDICINE PET SKULL BASE TO THIGH TECHNIQUE: 10.37 mCi F-18 FDG was injected intravenously. Full-ring PET imaging was performed from the skull base to thigh after the radiotracer. CT data was obtained and used for attenuation correction and anatomic localization. Fasting blood glucose: 91 mg/dl COMPARISON:  PET-CT 95/95/7974 chest CT February 01, 2024 FINDINGS: Mediastinal blood pool activity: SUV max 1.8 Liver activity: SUV max 2.8 NECK: Multiple interval increase in number size and metabolic activity of bilateral cervical and supraclavicular lymphadenopathy consistent with progressive nodal metastasis. For example a left level 2 lymph node measuring with max SUV 19.7, previously measured with max SUV 3.5. (Image 27). CHEST: Interval development of bilateral new and increased metabolic activity throughout both LEs with max SUV up to 14.8 on the right and 13.9 on the left with increased skin  thickness and FDG uptake right wrist none left. Post lumpectomy changes in both breasts there multiple new foci of chest wall involvement extending along the left rectus abdominus (image 111, 115 with max SUV up to 12 consistent with metastatic disease. Interval increase in metabolic activity along the bilateral pectoralis muscles left greater than right concerning for metastatic involvement. Metabolic activity in right axilla, metastatic versus postsurgical. No significant axillary lymphadenopathy. Fifth A right middle lobe pulmonary nodule with mild FDG uptake is increased in size now measuring 6 mm versus previously 3 mm (image 72) concerning for metastatic disease. Large anterior mediastinal conglomerate nodal mass with amorphous calcifications and mild FDG uptake grossly stable to prior with some FDG uptake along the periphery max SUV 7.6 versus previously 4.8 compatible with partially treated disease. Incidental CT findings: Postradiation changes along the bilateral paramediastinal upper lobes  with consolidation and air bronchogram, similar to prior with decreased metabolic activity compared to prior. With some FDG uptake along the peripheral ABDOMEN/PELVIS: Interval increase in size number and metabolic activity of numerous peripancreatic, retroperitoneal para-aortic and aortocaval, bilateral external iliac and inguinal metastatic lymphadenopathy. An index conglomerate node at the pancreatic head region measures 7.1 x 5.7 cm with max SUV 14.8 versus previously 3.6 x 3.3 cm max SUV in 7.2. A hypermetabolic lymph node/peritoneal implant anterior to left hepatic lobe, new to prior (image 101). SKELETON: Midsternal sclerotic osseous metastasis is stable to prior with max SUV 3.5 versus previously 3.2. Diffuse and heterogeneous bone marrow uptake without definite CT correlate may reflect underlying metastatic disease versus represent GCSF affect or a combination there of. A soft tissue metastatic nodule is also  identified posterior to the right scapula measuring with max SUV 10.6 (image 59), previously max SUV 5.8 . IMPRESSION: Increased metabolic activity throughout both breasts suggestive of progressive breast malignancy, posttreatment changes or a combination there of. Progressive metastatic nodal disease and soft tissue metastatic nodules. Stable anterior mediastinal soft tissue lymph nodes with calcifications Decreased metabolic activity of mid sternal osseous metastasis. Enlarging right he middle lobe pulmonary nodule consistent with pulmonary metastasis, below PET resolution. Diminished metabolic activity of postradiation changes associated with bilateral medial aspect of both hemithoraces. Diffuse bone marrow uptake secondary due to GCSF intake or new underlying osseous metastasis without definite CT correlate. Electronically Signed   By: Megan  Zare M.D.   On: 05/16/2024 18:04   CUP PACEART INCLINIC DEVICE CHECK Result Date: 05/11/2024 Normal in-clinic dual chamber pacemaker check. Presenting Rhythm: AS-VP. Routine testing of thresholds, sensing, and impedance demonstrate stable parameters and no programming changes needed at this time. Rare NSVT. Estimated longevity 49yr45mo. Pt enrolled in remote follow-up.   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 S/p: right axillary lymph node dissection, radiation, and adjuvant chemotherapy now with skin metastases, s/p adjuvant radiation completed on 04/30/2024.   Patient has healed well from her previous radiation treatment. The treated subcutaneous nodules have resolved or improved in size.   Patient will continue on Keytruda  and is scheduled for her next infusion next week. Dr. Odean is obtaining labs prior to this infusion to consider addition of chemotherapy.   She presents today with new subcutaneous nodules at the bilateral lateral chest and the RUQ. Aside  from some itching, these nodules do not appear to be interfering with her quality of life. Due to her previous radiation exposure, Dr. Izell recommends active surveillance of these nodules. We are hopeful they will respond to systemic treatment, but are happy to reassess if nodules progress in the future. Patient expressed understanding and is in agreement with the stated plan.   She is scheduled for her next infusion and follow-up appointment on 06/11/2024 with Dr. Gudena. Radiation follow-up as needed.    We appreciate the opportunity to again take part in this patient's care.  On date of service, in total, we spent 25 minutes on this encounter. Patient was seen in person.  _____________________________________    Leeroy Due, PA-C   Lauraine Izell, MD    [facility]  Radiation Oncology Direct Dial: [phone]  Fax: [phone] .com

## 2024-06-04 NOTE — Progress Notes (Signed)
 Jessica Simpson is here today for follow up post radiation to the chest wall and right axilla. History of left breast cancer diagnosed in 2012.  Breast Cancer Metastasized to Intrathoracic Lymph Node     They completed their radiation on: 2024-04-30   Does the patient complain of any of the following: Post radiation skin issues: Radiation dermatitis and darkening to breast. Breast Tenderness: Improving Breast Swelling:  Lymphadema: Lymphedema on bilateral breasts  Range of Motion limitations: She reports that it still feels tight, but she said that she has been stretching to help. Fatigue post radiation: Yes- moderate fatigue. Appetite good/fair/poor: Good Wt Readings from Last 3 Encounters: 06/05/24          211.0lb  04/04/24 210 lb 9.6 oz (95.5 kg)  03/21/24 206 lb 14.4 oz (93.8 kg)       Additional comments if applicable: She reports some bumps underneath breast. BP 132/78 (BP Location: Left Arm, Patient Position: Sitting)   Pulse (!) 107   Temp (!) 97.5 F (36.4 C) (Temporal)   Resp 20   Ht 5' 6 (1.676 m)   Wt 211 lb 4 oz (95.8 kg)   LMP 09/19/2018   SpO2 100%   BMI 34.10 kg/m

## 2024-06-05 ENCOUNTER — Ambulatory Visit
Admission: RE | Admit: 2024-06-05 | Discharge: 2024-06-05 | Disposition: A | Discharge status: Home / Self Care | Source: Ambulatory Visit | Attending: Radiology | Admitting: Radiology

## 2024-06-05 ENCOUNTER — Encounter: Payer: Self-pay | Admitting: Radiology

## 2024-06-05 VITALS — BP 132/78 | HR 107 | Temp 97.5°F | Resp 20 | Ht 66.0 in | Wt 211.2 lb

## 2024-06-05 DIAGNOSIS — C50919 Malignant neoplasm of unspecified site of unspecified female breast: Secondary | ICD-10-CM

## 2024-06-07 ENCOUNTER — Other Ambulatory Visit: Payer: Self-pay | Admitting: Pharmacist

## 2024-06-11 ENCOUNTER — Ambulatory Visit (INDEPENDENT_AMBULATORY_CARE_PROVIDER_SITE_OTHER): Payer: Self-pay

## 2024-06-11 DIAGNOSIS — I495 Sick sinus syndrome: Secondary | ICD-10-CM | POA: Diagnosis not present

## 2024-06-11 MED FILL — Fosaprepitant Dimeglumine For IV Infusion 150 MG (Base Eq): INTRAVENOUS | Qty: 5 | Status: AC

## 2024-06-12 ENCOUNTER — Inpatient Hospital Stay

## 2024-06-12 ENCOUNTER — Inpatient Hospital Stay (HOSPITAL_BASED_OUTPATIENT_CLINIC_OR_DEPARTMENT_OTHER): Admitting: Hematology and Oncology

## 2024-06-12 ENCOUNTER — Encounter: Payer: Self-pay | Admitting: Hematology and Oncology

## 2024-06-12 ENCOUNTER — Encounter: Payer: Self-pay | Admitting: *Deleted

## 2024-06-12 VITALS — BP 124/80 | HR 98 | Temp 97.4°F | Resp 18 | Ht 66.0 in | Wt 210.3 lb

## 2024-06-12 DIAGNOSIS — C771 Secondary and unspecified malignant neoplasm of intrathoracic lymph nodes: Secondary | ICD-10-CM | POA: Diagnosis not present

## 2024-06-12 DIAGNOSIS — C50919 Malignant neoplasm of unspecified site of unspecified female breast: Secondary | ICD-10-CM

## 2024-06-12 DIAGNOSIS — N6331 Unspecified lump in axillary tail of the right breast: Secondary | ICD-10-CM

## 2024-06-12 DIAGNOSIS — Z95828 Presence of other vascular implants and grafts: Secondary | ICD-10-CM

## 2024-06-12 DIAGNOSIS — C50012 Malignant neoplasm of nipple and areola, left female breast: Secondary | ICD-10-CM | POA: Diagnosis not present

## 2024-06-12 LAB — CUP PACEART REMOTE DEVICE CHECK
Battery Remaining Longevity: 119 mo
Battery Voltage: 3.02 V
Brady Statistic AP VP Percent: 0 %
Brady Statistic AP VS Percent: 0 %
Brady Statistic AS VP Percent: 99.85 %
Brady Statistic AS VS Percent: 0.14 %
Brady Statistic RA Percent Paced: 0 %
Brady Statistic RV Percent Paced: 99.86 %
Date Time Interrogation Session: 20250817190848
Implantable Lead Connection Status: 753985
Implantable Lead Connection Status: 753985
Implantable Lead Implant Date: 20240417
Implantable Lead Implant Date: 20240417
Implantable Lead Location: 753859
Implantable Lead Location: 753860
Implantable Lead Model: 3830
Implantable Lead Model: 5076
Implantable Pulse Generator Implant Date: 20240417
Lead Channel Impedance Value: 247 Ohm
Lead Channel Impedance Value: 342 Ohm
Lead Channel Impedance Value: 418 Ohm
Lead Channel Impedance Value: 589 Ohm
Lead Channel Pacing Threshold Amplitude: 0.625 V
Lead Channel Pacing Threshold Amplitude: 1.25 V
Lead Channel Pacing Threshold Pulse Width: 0.4 ms
Lead Channel Pacing Threshold Pulse Width: 0.4 ms
Lead Channel Sensing Intrinsic Amplitude: 18 mV
Lead Channel Sensing Intrinsic Amplitude: 18 mV
Lead Channel Sensing Intrinsic Amplitude: 2.375 mV
Lead Channel Sensing Intrinsic Amplitude: 2.375 mV
Lead Channel Setting Pacing Amplitude: 1.5 V
Lead Channel Setting Pacing Amplitude: 2.5 V
Lead Channel Setting Pacing Pulse Width: 0.4 ms
Lead Channel Setting Sensing Sensitivity: 0.9 mV
Zone Setting Status: 755011
Zone Setting Status: 755011

## 2024-06-12 LAB — CBC WITH DIFFERENTIAL (CANCER CENTER ONLY)
Abs Immature Granulocytes: 0.02 K/uL (ref 0.00–0.07)
Basophils Absolute: 0 K/uL (ref 0.0–0.1)
Basophils Relative: 2 %
Eosinophils Absolute: 0 K/uL (ref 0.0–0.5)
Eosinophils Relative: 1 %
HCT: 23.5 % — ABNORMAL LOW (ref 36.0–46.0)
Hemoglobin: 8 g/dL — ABNORMAL LOW (ref 12.0–15.0)
Immature Granulocytes: 2 %
Lymphocytes Relative: 26 %
Lymphs Abs: 0.3 K/uL — ABNORMAL LOW (ref 0.7–4.0)
MCH: 31.3 pg (ref 26.0–34.0)
MCHC: 34 g/dL (ref 30.0–36.0)
MCV: 91.8 fL (ref 80.0–100.0)
Monocytes Absolute: 0.2 K/uL (ref 0.1–1.0)
Monocytes Relative: 17 %
Neutro Abs: 0.5 K/uL — ABNORMAL LOW (ref 1.7–7.7)
Neutrophils Relative %: 52 %
Platelet Count: 90 K/uL — ABNORMAL LOW (ref 150–400)
RBC: 2.56 MIL/uL — ABNORMAL LOW (ref 3.87–5.11)
RDW: 16 % — ABNORMAL HIGH (ref 11.5–15.5)
WBC Count: 1 K/uL — ABNORMAL LOW (ref 4.0–10.5)
nRBC: 0 % (ref 0.0–0.2)

## 2024-06-12 LAB — MAGNESIUM: Magnesium: 1.8 mg/dL (ref 1.7–2.4)

## 2024-06-12 LAB — CMP (CANCER CENTER ONLY)
ALT: 9 U/L (ref 0–44)
AST: 16 U/L (ref 15–41)
Albumin: 4.1 g/dL (ref 3.5–5.0)
Alkaline Phosphatase: 85 U/L (ref 38–126)
Anion gap: 8 (ref 5–15)
BUN: 19 mg/dL (ref 6–20)
CO2: 26 mmol/L (ref 22–32)
Calcium: 9.2 mg/dL (ref 8.9–10.3)
Chloride: 103 mmol/L (ref 98–111)
Creatinine: 0.9 mg/dL (ref 0.44–1.00)
GFR, Estimated: >60 mL/min (ref >60)
Glucose, Bld: 131 mg/dL — ABNORMAL HIGH (ref 70–99)
Potassium: 3.7 mmol/L (ref 3.5–5.1)
Sodium: 137 mmol/L (ref 135–145)
Total Bilirubin: 0.4 mg/dL (ref 0.0–1.2)
Total Protein: 7.6 g/dL (ref 6.5–8.1)

## 2024-06-12 LAB — PHOSPHORUS: Phosphorus: 4 mg/dL (ref 2.5–4.6)

## 2024-06-12 MED ORDER — SODIUM CHLORIDE 0.9 % IV SOLN
200.0000 mg | Freq: Once | INTRAVENOUS | Status: AC
  Administered 2024-06-12: 200 mg via INTRAVENOUS
  Filled 2024-06-12: qty 200

## 2024-06-12 MED ORDER — SODIUM CHLORIDE 0.9 % IV SOLN: Freq: Once | INTRAVENOUS | Status: AC

## 2024-06-12 MED ORDER — FILGRASTIM-SNDZ 480 MCG/0.8ML IJ SOSY
480.0000 ug | PREFILLED_SYRINGE | Freq: Once | INTRAMUSCULAR | Status: AC
  Administered 2024-06-12: 480 ug via SUBCUTANEOUS
  Filled 2024-06-12: qty 0.8

## 2024-06-12 MED ORDER — SODIUM CHLORIDE 0.9% FLUSH
10.0000 mL | Freq: Once | INTRAVENOUS | Status: AC
  Administered 2024-06-12: 10 mL

## 2024-06-12 MED ORDER — ACETAMINOPHEN 325 MG PO TABS: 650.0000 mg | ORAL_TABLET | Freq: Once | ORAL | Status: DC

## 2024-06-12 MED ORDER — DIPHENHYDRAMINE HCL 50 MG/ML IJ SOLN: 50.0000 mg | Freq: Once | INTRAMUSCULAR | Status: DC

## 2024-06-12 NOTE — Progress Notes (Signed)
 Patient Care Team: [name], MD as PCP - General (Family Medicine) Waddell Danelle ORN, MD as PCP - Electrophysiology (Cardiology) Odean Potts, MD as Consulting Physician (Hematology and Oncology) Pickenpack-Cousar, Fannie SAILOR, NP as Nurse Practitioner [facility] Palliative Medicine)  DIAGNOSIS:  Encounter Diagnosis  Name Primary?   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)  06/30/2011 Initial Diagnosis   Breast cancer, IDC, Left, Stage III, Triple negative, 7.6 cm breast mass and palpable axillary mass   09/06/2011 Miscellaneous   BRCA 1 and 2: Negative   09/14/2011 - 11/23/2011 Neo-Adjuvant Chemotherapy   Dose dense FEC followed by dose dense Taxotere    12/21/2011 Surgery   Left lumpectomy: High-grade poorly differentiated IDC 1.7 cm with high-grade DCIS, margins negative, 3/7 lymph nodes positive, ER 0%, PR 0%, HER-2 negative ratio 1.33 T1CN1 stage IIb   12/31/2011 - 02/15/2012 Radiation Therapy   Radiation at [facility]   09/26/2017 Relapse/Recurrence   Left breast upper outer quadrant within the lumpectomy bed: Fibrosis no malignancy, right axillary lymph node biopsy metastatic high-grade carcinoma ER 0%, PR 0%, Ki-67 90%, HER-2 negative ratio 1.11 (similar to previous ductal carcinoma)   11/14/2017 - 03/06/2018 Neo-Adjuvant Chemotherapy   Neo-Adjuvant chemotherapy with Gemzar  and carboplatin  days 1 and 8 every 3 weeks   01/10/2018 Genetic Testing   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  The Hereditary Gene Panel offered by [facility] includes sequencing and/or deletion duplication testing of the following 47 genes: APC, ATM, AXIN2, BARD1, BMPR1A, BRCA1, BRCA2, BRIP1, CDH1, CDK4, CDKN2A (p14ARF), CDKN2A (p16INK4a), CHEK2, CTNNA1, DICER1, EPCAM (Deletion/duplication testing  only), GREM1 (promoter region deletion/duplication testing only), KIT, MEN1, MLH1, MSH2, MSH3, MSH6, MUTYH, NBN, NF1, NHTL1, PALB2, PDGFRA, PMS2, POLD1, POLE, PTEN, RAD50, RAD51C, RAD51D, SDHB, SDHC, SDHD, SMAD4, SMARCA4. STK11, TP53, TSC1, TSC2, and VHL.  The following genes were evaluated for sequence changes only: SDHA and HOXB13 c.251G>A variant only. The report date is January 10, 2018.    05/31/2023 Procedure   Mediastinal mass biopsy: Metastatic poorly differentiated carcinoma compatible with breast primary ER +60%, PR 0%, HER2 0, Ki-67 40%    06/02/2023 PET scan   PET/CT Anterior mediastinal mass is hypermetabolic and centrally necrotic, soft tissue lesions anterior to the heart, 10 mm pleural-based lesion, level 2 cervical nodes, hypermetabolism hepatic dome mottled FDG accumulation throughout the skeletal system    06/06/2023 - 07/14/2023 Anti-estrogen oral therapy   Antiestrogen therapy with Verzenio  and letrozole     07/07/2023 Relapse/Recurrence   Increasing mediastinal mass 11.6 cm (used to be 10.8 cm) effacing the heart and involving the sternum, additional mediastinal/pericardial lymph nodes increased right extra lymph node 1.5 cm, nodules 0.2 cm (used to be 0.8 cm), few new scattered lung nodules)   07/25/2023 -  Chemotherapy   Patient is on Treatment Plan : BREAST METASTATIC Sacituzumab govitecan -hziy (Trodelvy ) D1,8 q21d     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)  04/06/2018 Surgery   Right axillary lymph node dissection: 1/11 node positive for metastatic high-grade carcinoma   04/25/2018 Initial Diagnosis   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)   04/25/2018 Cancer Staging   Staging form: Breast, AJCC 8th Edition - Clinical: Stage IIB (cT0, cN1, cM0, G3, ER-, PR-, HER2-) - Signed by Izell Domino, MD on 04/25/2018   05/16/2018 - 06/22/2018 Radiation Therapy  Adjuvant radiation therapy with Xeloda    07/11/2018 - 10/24/2018 Chemotherapy   Adjuvant  chemotherapy with CMF     CHIEF COMPLIANT:   HISTORY OF PRESENT ILLNESS:   History of Present Illness  Krystyl Cannell is a 53 year old female with metastatic cancer who presents for chemotherapy management.  She is undergoing treatment with Trodelvy  and immunotherapy. Administration of Trodelvy  on day eight has been challenging, leading to missed doses.  She experiences fatigue and occasional loose stools following treatment, managed with Imodium. No nausea is present.  Recent labs show a white blood cell count of 1.0, hemoglobin of 8.0, and platelets at 90. She feels fatigued and requires increased effort to complete tasks.  She received a blood transfusion on July 28th when her hemoglobin was 7.6.     ALLERGIES:  is allergic to codeine and hydrocodone-acetaminophen .  MEDICATIONS:  Current Outpatient Medications  Medication Sig Dispense Refill   acetaminophen  (TYLENOL ) 500 MG tablet Take 500 mg by mouth every 6 (six) hours as needed. For pain     albuterol  (ACCUNEB ) 0.63 MG/3ML nebulizer solution Take 3 mLs (0.63 mg total) by nebulization every 6 (six) hours as needed for wheezing. 75 mL 12   amLODipine  (NORVASC ) 5 MG tablet Take 10 mg by mouth daily.  6   Cyanocobalamin  (VITAMIN B-12) 5000 MCG TBDP Take 5,000 mcg by mouth daily.     diphenoxylate -atropine  (LOMOTIL ) 2.5-0.025 MG tablet Take 1-2 tablets by mouth 4 (four) times daily as needed for diarrhea or loose stools. 60 tablet 0   ibuprofen  (ADVIL ) 800 MG tablet TAKE 1 TABLET BY MOUTH EVERY 8 HOURS AS NEEDED 30 tablet 0   lidocaine -prilocaine  (EMLA ) cream Apply to affected area once 30 g 3   loratadine  (CLARITIN ) 10 MG tablet Take 1 tablet (10 mg total) by mouth daily as needed for allergies. 90 tablet 2   LORazepam  (ATIVAN ) 1 MG tablet Take 1 tablet (1 mg total) by mouth every 8 (eight) hours. 10 tablet 0   magnesium  oxide (MAG-OX) 400 MG tablet TAKE 1 TABLET BY MOUTH EVERY DAY 30 tablet 1   metoprolol  succinate  (TOPROL  XL) 25 MG 24 hr tablet Take 1 tablet (25 mg total) by mouth daily. 90 tablet 3   Omega-3 Fatty Acids (FISH OIL MAXIMUM STRENGTH) 1200 MG CPDR Take 1,200 mg by mouth daily.     ondansetron  (ZOFRAN ) 8 MG tablet Take 1 tablet (8 mg total) by mouth 2 (two) times daily. 60 tablet 6   prochlorperazine  (COMPAZINE ) 10 MG tablet Take 1 tablet (10 mg total) by mouth every 6 (six) hours as needed for nausea or vomiting. 30 tablet 6   Vitamin D , Ergocalciferol , (DRISDOL ) 1.25 MG (50000 UNIT) CAPS capsule Take 1 capsule (50,000 Units total) by mouth once a week. 12 capsule 2   No current facility-administered medications for this visit.   Facility-Administered Medications Ordered in Other Visits  Medication Dose Route Frequency Provider Last Rate Last Admin   sodium chloride  flush (NS) 0.9 % injection 10 mL  10 mL Intracatheter PRN Odean Potts, MD   10 mL at 05/08/24 0953    PHYSICAL EXAMINATION: ECOG PERFORMANCE STATUS: 1 - Symptomatic but completely ambulatory  Vitals:   06/12/24 0839  BP: 124/80  Pulse: 98  Resp: 18  Temp: (!) 97.4 F (36.3 C)  SpO2: 100%   Filed Weights   06/12/24 0839  Weight: 210 lb 4.8 oz (95.4 kg)    Physical Exam   (exam performed in the presence  of a chaperone)  LABORATORY DATA:  I have reviewed the data as listed    Latest Ref Rng & Units 05/29/2024   11:07 AM 05/21/2024    9:06 AM 05/08/2024    8:29 AM  CMP  Glucose 70 - 99 mg/dL 879  821  98   BUN 6 - 20 mg/dL 16  18  17    Creatinine 0.44 - 1.00 mg/dL 9.07  9.03  9.13   Sodium 135 - 145 mmol/L 137  138  139   Potassium 3.5 - 5.1 mmol/L 3.6  3.0  3.5   Chloride 98 - 111 mmol/L 101  101  104   CO2 22 - 32 mmol/L 29  28  28    Calcium 8.9 - 10.3 mg/dL 9.3  8.9  9.2   Total Protein 6.5 - 8.1 g/dL 7.7  7.5  7.0   Total Bilirubin 0.0 - 1.2 mg/dL 0.5  0.4  0.3   Alkaline Phos 38 - 126 U/L 103  90  88   AST 15 - 41 U/L 14  15  13    ALT 0 - 44 U/L 9  8  7      Lab Results  Component Value Date    WBC 3.1 (L) 05/29/2024   HGB 9.3 (L) 05/29/2024   HCT 27.5 (L) 05/29/2024   MCV 91.7 05/29/2024   PLT 69 (L) 05/29/2024   NEUTROABS 2.2 05/29/2024    ASSESSMENT & PLAN:  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) 06/30/2011: Left breast cancer stage III triple negative, 7.6 cm 09/14/2011: Dose dense FEC followed by dose dense Taxotere  neoadjuvant chemo 12/21/2011: Left lumpectomy: High-grade poorly differentiated IDC 1.7 cm 3/7 nodes positive triple negative, XRT 09/26/2017: Relapse: Right axillary lymph node biopsy positive for metastatic high-grade carcinoma triple negative 11/14/2017: Neoadjuvant Gemzar  and carboplatin  05/31/2023: Relapse: Mediastinal mass biopsy: Metastatic poorly differentiated carcinoma ER 60%, HER2 negative 06/06/2023: Verzinio with letrozole  07/25/2023: Trodelvy  02/21/2024: Chest wall skin biopsy: Poorly differentiated carcinoma ER 0% PR 0% Ki-67 40%, HER2 0 --------------------------------------------------------------------------------------------------------------------- [facility] 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  We discussed the pros and cons of getting blood transfusion and she decided to hold off at this time. Thrombocytopenia: Slight improvement since we did not give her treatment 2 weeks back. Neutropenia: With a white count of 1.0, she will not be receiving Trodelvy  today.  She will however receive Keytruda .  I also recommended that she receive 400 mcg of Granix  injection today as well as next Saturday and Monday. She will receive Neulasta  injection after the day 8 Trodelvy  infusion.   Shortness of breath and cough Cutaneous metastasis   PET-CT: 05/16/2024: Increased metabolic activity throughout both breast suggestive of progressive  breast malignancy.  Progressive metastatic nodal disease and soft tissue nodules.  Stable anterior mediastinal soft tissue lymph nodes.  Decreased sternal activity.  Enlarging right middle lobe 6 mm lung metastases.  Increase in peripancreatic, retroperitoneal, para-aortic, aortocaval lymph nodes conglomerate measures 7.1 cm   Since Keytruda  has been added we will wait to see and repeat another PET CT scan in October 2025 ------------------------------------- Assessment and Plan Assessment & Plan Metastatic breast cancer with intrathoracic lymph node involvement Undergoing Trodelvy  and immunotherapy. Trodelvy  on day eight is challenging, considering schedule adjustment. Immunotherapy is primary as Trodelvy  alone was suboptimal. Decision on regimen with  Neupogen  support or schedule adjustment is ongoing. - Administer immunotherapy today. - Administer Neupogen  shots on Saturday and Monday before next week's treatment. - Continue with Trodelvy  treatment next week with Neupogen  support. - Administer Neulasta  after the day eight treatment in the future.  Anemia due to antineoplastic chemotherapy Hemoglobin at 8.0, down from 9.3. Reports fatigue, no immediate transfusion needed. Considering transfusion closer to trip if fatigue worsens. - Monitor hemoglobin levels and symptoms of fatigue. - Consider blood transfusion if fatigue worsens, especially before the planned trip in early September.  Leukopenia due to antineoplastic chemotherapy White blood cell count at 1.0. Neupogen  planned to boost count for Trodelvy  schedule. Neulasta  unsuitable due to short interval. - Administer Neupogen  shots today, Saturday, and Monday to boost white cell count. - Administer Neulasta  after the day eight treatment in the future.  Thrombocytopenia due to antineoplastic chemotherapy Platelet count improved to 90.  Chemotherapy-induced diarrhea Reports a few loose stools post-treatment, managed with Imodium. -  Continue to manage loose stools with Imodium as needed.      No orders of the defined types were placed in this encounter.  The patient has a good understanding of the overall plan. she agrees with it. she will call with any problems that may develop before the next visit here. Total time spent: 45 mins including face to face time and time spent for planning, charting and co-ordination of care   Naomi MARLA Chad, MD 06/12/24

## 2024-06-12 NOTE — Progress Notes (Signed)
 The following name brand - Neulasta -  has been selected for use in this patient based on insurance approval.  Niels Molt, PharmD

## 2024-06-12 NOTE — Progress Notes (Signed)
 Per MD, pt will only be treated with Keytruda  today due to ANC being 0.5.

## 2024-06-12 NOTE — Progress Notes (Signed)
 Ok to d/c premeds today since pt is only receiving Keytruda  per Dr. Odean.  Monterrius Cardosa, PharmD, MBA

## 2024-06-12 NOTE — Assessment & Plan Note (Signed)
 06/30/2011: Left breast cancer stage III triple negative, 7.6 cm 09/14/2011: Dose dense FEC followed by dose dense Taxotere  neoadjuvant chemo 12/21/2011: Left lumpectomy: High-grade poorly differentiated IDC 1.7 cm 3/7 nodes positive triple negative, XRT 09/26/2017: Relapse: Right axillary lymph node biopsy positive for metastatic high-grade carcinoma triple negative 11/14/2017: Neoadjuvant Gemzar  and carboplatin  05/31/2023: Relapse: Mediastinal mass biopsy: Metastatic poorly differentiated carcinoma ER 60%, HER2 negative 06/06/2023: Verzinio with letrozole  07/25/2023: Trodelvy  02/21/2024: Chest wall skin biopsy: Poorly differentiated carcinoma ER 0% PR 0% Ki-67 40%, HER2 0 --------------------------------------------------------------------------------------------------------------------- [facility] 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  I recommend blood transfusion which we will try to accommodate tomorrow. Thrombocytopenia:   Neutropenia: Receiving Granix  injections   Shortness of breath and cough Cutaneous metastasis   PET-CT: 05/16/2024: Increased metabolic activity throughout both breast suggestive of progressive breast malignancy.  Progressive metastatic nodal disease and soft tissue nodules.  Stable anterior mediastinal soft tissue lymph nodes.  Decreased sternal activity.  Enlarging right middle lobe 6 mm lung metastases.  Increase in peripancreatic, retroperitoneal, para-aortic, aortocaval lymph nodes conglomerate measures 7.1 cm   Since Keytruda  has been added we will wait to see and repeat another PET CT scan in October 2025

## 2024-06-12 NOTE — Patient Instructions (Signed)

## 2024-06-13 ENCOUNTER — Other Ambulatory Visit: Payer: Self-pay | Admitting: Hematology and Oncology

## 2024-06-13 DIAGNOSIS — C50919 Malignant neoplasm of unspecified site of unspecified female breast: Secondary | ICD-10-CM

## 2024-06-13 DIAGNOSIS — N6331 Unspecified lump in axillary tail of the right breast: Secondary | ICD-10-CM

## 2024-06-15 ENCOUNTER — Inpatient Hospital Stay

## 2024-06-16 ENCOUNTER — Inpatient Hospital Stay

## 2024-06-16 ENCOUNTER — Other Ambulatory Visit: Payer: Self-pay | Admitting: Adult Health

## 2024-06-16 VITALS — BP 141/84 | HR 100 | Temp 98.4°F | Resp 18

## 2024-06-16 DIAGNOSIS — Z95828 Presence of other vascular implants and grafts: Secondary | ICD-10-CM

## 2024-06-16 DIAGNOSIS — C50012 Malignant neoplasm of nipple and areola, left female breast: Secondary | ICD-10-CM | POA: Diagnosis not present

## 2024-06-16 MED ORDER — FILGRASTIM-SNDZ 480 MCG/0.8ML IJ SOSY
480.0000 ug | PREFILLED_SYRINGE | Freq: Once | INTRAMUSCULAR | Status: AC
  Administered 2024-06-16: 480 ug via SUBCUTANEOUS

## 2024-06-17 ENCOUNTER — Ambulatory Visit: Payer: Self-pay | Admitting: Internal Medicine

## 2024-06-18 ENCOUNTER — Inpatient Hospital Stay

## 2024-06-18 VITALS — BP 106/52 | HR 108 | Temp 98.3°F | Resp 18

## 2024-06-18 DIAGNOSIS — Z95828 Presence of other vascular implants and grafts: Secondary | ICD-10-CM

## 2024-06-18 DIAGNOSIS — C50012 Malignant neoplasm of nipple and areola, left female breast: Secondary | ICD-10-CM | POA: Diagnosis not present

## 2024-06-18 MED ORDER — FILGRASTIM-SNDZ 480 MCG/0.8ML IJ SOSY
480.0000 ug | PREFILLED_SYRINGE | Freq: Once | INTRAMUSCULAR | Status: AC
  Administered 2024-06-18: 480 ug via SUBCUTANEOUS
  Filled 2024-06-18: qty 0.8

## 2024-06-18 MED FILL — Fosaprepitant Dimeglumine For IV Infusion 150 MG (Base Eq): INTRAVENOUS | Qty: 5 | Status: AC

## 2024-06-19 ENCOUNTER — Inpatient Hospital Stay

## 2024-06-19 VITALS — BP 123/66 | HR 109 | Temp 98.3°F | Resp 20 | Wt 208.0 lb

## 2024-06-19 DIAGNOSIS — N6331 Unspecified lump in axillary tail of the right breast: Secondary | ICD-10-CM

## 2024-06-19 DIAGNOSIS — Z95828 Presence of other vascular implants and grafts: Secondary | ICD-10-CM

## 2024-06-19 DIAGNOSIS — C50919 Malignant neoplasm of unspecified site of unspecified female breast: Secondary | ICD-10-CM

## 2024-06-19 DIAGNOSIS — C50012 Malignant neoplasm of nipple and areola, left female breast: Secondary | ICD-10-CM | POA: Diagnosis not present

## 2024-06-19 LAB — CBC WITH DIFFERENTIAL (CANCER CENTER ONLY)
Abs Immature Granulocytes: 0.15 K/uL — ABNORMAL HIGH (ref 0.00–0.07)
Basophils Absolute: 0 K/uL (ref 0.0–0.1)
Basophils Relative: 1 %
Eosinophils Absolute: 0 K/uL (ref 0.0–0.5)
Eosinophils Relative: 0 %
HCT: 22.9 % — ABNORMAL LOW (ref 36.0–46.0)
Hemoglobin: 7.5 g/dL — ABNORMAL LOW (ref 12.0–15.0)
Immature Granulocytes: 3 %
Lymphocytes Relative: 7 %
Lymphs Abs: 0.4 K/uL — ABNORMAL LOW (ref 0.7–4.0)
MCH: 30.4 pg (ref 26.0–34.0)
MCHC: 32.8 g/dL (ref 30.0–36.0)
MCV: 92.7 fL (ref 80.0–100.0)
Monocytes Absolute: 0.6 K/uL (ref 0.1–1.0)
Monocytes Relative: 12 %
Neutro Abs: 4 K/uL (ref 1.7–7.7)
Neutrophils Relative %: 77 %
Platelet Count: 75 K/uL — ABNORMAL LOW (ref 150–400)
RBC: 2.47 MIL/uL — ABNORMAL LOW (ref 3.87–5.11)
RDW: 16.6 % — ABNORMAL HIGH (ref 11.5–15.5)
WBC Count: 5.1 K/uL (ref 4.0–10.5)
nRBC: 0 % (ref 0.0–0.2)

## 2024-06-19 LAB — CMP (CANCER CENTER ONLY)
ALT: 7 U/L (ref 0–44)
AST: 15 U/L (ref 15–41)
Albumin: 4.1 g/dL (ref 3.5–5.0)
Alkaline Phosphatase: 90 U/L (ref 38–126)
Anion gap: 9 (ref 5–15)
BUN: 20 mg/dL (ref 6–20)
CO2: 26 mmol/L (ref 22–32)
Calcium: 9.2 mg/dL (ref 8.9–10.3)
Chloride: 101 mmol/L (ref 98–111)
Creatinine: 0.96 mg/dL (ref 0.44–1.00)
GFR, Estimated: >60 mL/min (ref >60)
Glucose, Bld: 106 mg/dL — ABNORMAL HIGH (ref 70–99)
Potassium: 3.4 mmol/L — ABNORMAL LOW (ref 3.5–5.1)
Sodium: 136 mmol/L (ref 135–145)
Total Bilirubin: 0.5 mg/dL (ref 0.0–1.2)
Total Protein: 7.4 g/dL (ref 6.5–8.1)

## 2024-06-19 LAB — SAMPLE TO BLOOD BANK

## 2024-06-19 LAB — PHOSPHORUS: Phosphorus: 3.7 mg/dL (ref 2.5–4.6)

## 2024-06-19 LAB — MAGNESIUM: Magnesium: 1.6 mg/dL — ABNORMAL LOW (ref 1.7–2.4)

## 2024-06-19 MED ORDER — ACETAMINOPHEN 325 MG PO TABS
650.0000 mg | ORAL_TABLET | Freq: Once | ORAL | Status: AC
  Administered 2024-06-19: 650 mg via ORAL
  Filled 2024-06-19: qty 2

## 2024-06-19 MED ORDER — DARBEPOETIN ALFA 300 MCG/0.6ML IJ SOSY
300.0000 ug | PREFILLED_SYRINGE | Freq: Once | INTRAMUSCULAR | Status: AC
  Administered 2024-06-19: 300 ug via SUBCUTANEOUS
  Filled 2024-06-19: qty 0.6

## 2024-06-19 MED ORDER — SODIUM CHLORIDE 0.9 % IV SOLN: Freq: Once | INTRAVENOUS | Status: AC

## 2024-06-19 MED ORDER — SODIUM CHLORIDE 0.9 % IV SOLN
480.0000 mg | Freq: Once | INTRAVENOUS | Status: AC
  Administered 2024-06-19: 480 mg via INTRAVENOUS
  Filled 2024-06-19: qty 48

## 2024-06-19 MED ORDER — DEXAMETHASONE SODIUM PHOSPHATE 10 MG/ML IJ SOLN
10.0000 mg | Freq: Once | INTRAMUSCULAR | Status: AC
  Administered 2024-06-19: 10 mg via INTRAVENOUS
  Filled 2024-06-19: qty 1

## 2024-06-19 MED ORDER — PALONOSETRON HCL INJECTION 0.25 MG/5ML
0.2500 mg | Freq: Once | INTRAVENOUS | Status: AC
  Administered 2024-06-19: 0.25 mg via INTRAVENOUS
  Filled 2024-06-19: qty 5

## 2024-06-19 MED ORDER — SODIUM CHLORIDE 0.9% FLUSH
10.0000 mL | Freq: Once | INTRAVENOUS | Status: AC
Start: 2024-06-19 — End: 2024-06-19
  Administered 2024-06-19: 10 mL

## 2024-06-19 MED ORDER — SODIUM CHLORIDE 0.9 % IV SOLN
150.0000 mg | Freq: Once | INTRAVENOUS | Status: AC
  Administered 2024-06-19: 150 mg via INTRAVENOUS
  Filled 2024-06-19: qty 150

## 2024-06-19 MED ORDER — FAMOTIDINE IN NACL 20-0.9 MG/50ML-% IV SOLN
20.0000 mg | Freq: Once | INTRAVENOUS | Status: AC
  Administered 2024-06-19: 20 mg via INTRAVENOUS
  Filled 2024-06-19: qty 50

## 2024-06-19 MED ORDER — DIPHENHYDRAMINE HCL 50 MG/ML IJ SOLN
50.0000 mg | Freq: Once | INTRAMUSCULAR | Status: AC
  Administered 2024-06-19: 50 mg via INTRAVENOUS
  Filled 2024-06-19: qty 1

## 2024-06-19 MED ORDER — PEGFILGRASTIM 6 MG/0.6ML ~~LOC~~ PSKT
6.0000 mg | PREFILLED_SYRINGE | Freq: Once | SUBCUTANEOUS | Status: AC
  Administered 2024-06-19: 6 mg via SUBCUTANEOUS
  Filled 2024-06-19: qty 0.6

## 2024-06-19 NOTE — Patient Instructions (Signed)
 [facility]  Discharge Instructions: Thank you for choosing [facility] to provide your oncology and hematology care.   If you have a lab appointment with the Cancer Center, please go directly to the Cancer Center and check in at the registration area.   Wear comfortable clothing and clothing appropriate for easy access to any Portacath or PICC line.   We strive to give you quality time with your provider. You may need to reschedule your appointment if you arrive late (15 or more minutes).  Arriving late affects you and other patients whose appointments are after yours.  Also, if you miss three or more appointments without notifying the office, you may be dismissed from the clinic at the provider's discretion.      For prescription refill requests, have your pharmacy contact our office and allow 72 hours for refills to be completed.    Today you received the following chemotherapy and/or immunotherapy agents: [name]      To help prevent nausea and vomiting after your treatment, we encourage you to take your nausea medication as directed.  BELOW ARE SYMPTOMS THAT SHOULD BE REPORTED IMMEDIATELY: *FEVER GREATER THAN 100.4 F (38 C) OR HIGHER *CHILLS OR SWEATING *NAUSEA AND VOMITING THAT IS NOT CONTROLLED WITH YOUR NAUSEA MEDICATION *UNUSUAL SHORTNESS OF BREATH *UNUSUAL BRUISING OR BLEEDING *URINARY PROBLEMS (pain or burning when urinating, or frequent urination) *BOWEL PROBLEMS (unusual diarrhea, constipation, pain near the anus) TENDERNESS IN MOUTH AND THROAT WITH OR WITHOUT PRESENCE OF ULCERS (sore throat, sores in mouth, or a toothache) UNUSUAL RASH, SWELLING OR PAIN  UNUSUAL VAGINAL DISCHARGE OR ITCHING   Items with * indicate a potential emergency and should be followed up as soon as possible or go to the Emergency Department if any problems should occur.  Please show the CHEMOTHERAPY ALERT CARD or IMMUNOTHERAPY  ALERT CARD at check-in to the Emergency Department and triage nurse.  Should you have questions after your visit or need to cancel or reschedule your appointment, please contact [facility]  Dept: [phone]  and follow the prompts.  Office hours are 8:00 a.m. to 4:30 p.m. Monday - Friday. Please note that voicemails left after 4:00 p.m. may not be returned until the following business day.  We are closed weekends and major holidays. You have access to a nurse at all times for urgent questions. Please call the main number to the clinic Dept: [phone] and follow the prompts.   For any non-urgent questions, you may also contact your provider using MyChart. We now offer e-Visits for anyone 66 and older to request care online for non-urgent symptoms. For details visit [email or web address].   Also download the MyChart app! Go to the app store, search "MyChart", open the app, select [place], and log in with your MyChart username and password.

## 2024-06-19 NOTE — Progress Notes (Signed)
 Neulasta  has been changed to Neulasta  Onpro for this cycle to assist with tx compliance and reduce scheduling/transportation burden for pt. F/u with pt at next cycle to see if she prefers Onpro or injection and update tx plan as appropriate. Tx plan wt basis updated to today's wt=94.3kg per Dr. Gudena.   Magdelyn Roebuck, PharmD, MBA

## 2024-06-20 ENCOUNTER — Inpatient Hospital Stay

## 2024-06-21 ENCOUNTER — Telehealth: Payer: Self-pay | Admitting: *Deleted

## 2024-06-21 ENCOUNTER — Encounter: Payer: Self-pay | Admitting: Hematology and Oncology

## 2024-06-21 NOTE — Telephone Encounter (Signed)
 Was made aware by radiation nurse that pt left a voice message stating she has sores and nipple discharge with a foul odor to her breast, denies fever and pain and was noticed on yesterday. Called pt to f/u and asked that pt come in today or tomorrow to f/u. Pt stated that she could not come in for appts that were offered. Advised pt that we were closed for the holiday on Monday and had open appts for Tuesday. Pt accepted Tuesday appt for 0940. Pt verbalized understanding. Advised to go to urgent care or ER for worsening symptoms.

## 2024-06-26 ENCOUNTER — Inpatient Hospital Stay: Admitting: Adult Health

## 2024-07-05 ENCOUNTER — Inpatient Hospital Stay (HOSPITAL_BASED_OUTPATIENT_CLINIC_OR_DEPARTMENT_OTHER): Admitting: Adult Health

## 2024-07-05 ENCOUNTER — Other Ambulatory Visit: Payer: Self-pay | Admitting: Hematology and Oncology

## 2024-07-05 ENCOUNTER — Encounter: Payer: Self-pay | Admitting: Adult Health

## 2024-07-05 ENCOUNTER — Ambulatory Visit

## 2024-07-05 ENCOUNTER — Other Ambulatory Visit: Payer: Self-pay | Admitting: *Deleted

## 2024-07-05 ENCOUNTER — Inpatient Hospital Stay: Attending: Hematology and Oncology

## 2024-07-05 VITALS — BP 114/62 | HR 116 | Temp 97.4°F | Resp 18 | Ht 66.0 in | Wt 201.4 lb

## 2024-07-05 DIAGNOSIS — Z17421 Hormone receptor negative with human epidermal growth factor receptor 2 negative status: Secondary | ICD-10-CM | POA: Insufficient documentation

## 2024-07-05 DIAGNOSIS — Z801 Family history of malignant neoplasm of trachea, bronchus and lung: Secondary | ICD-10-CM | POA: Diagnosis not present

## 2024-07-05 DIAGNOSIS — D649 Anemia, unspecified: Secondary | ICD-10-CM | POA: Diagnosis not present

## 2024-07-05 DIAGNOSIS — T451X5A Adverse effect of antineoplastic and immunosuppressive drugs, initial encounter: Secondary | ICD-10-CM | POA: Diagnosis not present

## 2024-07-05 DIAGNOSIS — Z800 Family history of malignant neoplasm of digestive organs: Secondary | ICD-10-CM | POA: Insufficient documentation

## 2024-07-05 DIAGNOSIS — N6331 Unspecified lump in axillary tail of the right breast: Secondary | ICD-10-CM

## 2024-07-05 DIAGNOSIS — A6920 Lyme disease, unspecified: Secondary | ICD-10-CM | POA: Insufficient documentation

## 2024-07-05 DIAGNOSIS — C50919 Malignant neoplasm of unspecified site of unspecified female breast: Secondary | ICD-10-CM

## 2024-07-05 DIAGNOSIS — G4730 Sleep apnea, unspecified: Secondary | ICD-10-CM | POA: Diagnosis not present

## 2024-07-05 DIAGNOSIS — D696 Thrombocytopenia, unspecified: Secondary | ICD-10-CM | POA: Insufficient documentation

## 2024-07-05 DIAGNOSIS — Z5111 Encounter for antineoplastic chemotherapy: Secondary | ICD-10-CM | POA: Diagnosis not present

## 2024-07-05 DIAGNOSIS — D701 Agranulocytosis secondary to cancer chemotherapy: Secondary | ICD-10-CM | POA: Diagnosis not present

## 2024-07-05 DIAGNOSIS — C771 Secondary and unspecified malignant neoplasm of intrathoracic lymph nodes: Secondary | ICD-10-CM | POA: Insufficient documentation

## 2024-07-05 DIAGNOSIS — C50012 Malignant neoplasm of nipple and areola, left female breast: Secondary | ICD-10-CM | POA: Insufficient documentation

## 2024-07-05 DIAGNOSIS — Z923 Personal history of irradiation: Secondary | ICD-10-CM | POA: Diagnosis not present

## 2024-07-05 DIAGNOSIS — D709 Neutropenia, unspecified: Secondary | ICD-10-CM | POA: Insufficient documentation

## 2024-07-05 DIAGNOSIS — I10 Essential (primary) hypertension: Secondary | ICD-10-CM | POA: Insufficient documentation

## 2024-07-05 DIAGNOSIS — Z803 Family history of malignant neoplasm of breast: Secondary | ICD-10-CM | POA: Diagnosis not present

## 2024-07-05 LAB — CBC WITH DIFFERENTIAL (CANCER CENTER ONLY)
Abs Immature Granulocytes: 0.06 K/uL (ref 0.00–0.07)
Basophils Absolute: 0 K/uL (ref 0.0–0.1)
Basophils Relative: 1 %
Eosinophils Absolute: 0 K/uL (ref 0.0–0.5)
Eosinophils Relative: 1 %
HCT: 20.7 % — ABNORMAL LOW (ref 36.0–46.0)
Hemoglobin: 6.7 g/dL — CL (ref 12.0–15.0)
Immature Granulocytes: 4 %
Lymphocytes Relative: 16 %
Lymphs Abs: 0.2 K/uL — ABNORMAL LOW (ref 0.7–4.0)
MCH: 30.5 pg (ref 26.0–34.0)
MCHC: 32.4 g/dL (ref 30.0–36.0)
MCV: 94.1 fL (ref 80.0–100.0)
Monocytes Absolute: 0.1 K/uL (ref 0.1–1.0)
Monocytes Relative: 10 %
Neutro Abs: 1 K/uL — ABNORMAL LOW (ref 1.7–7.7)
Neutrophils Relative %: 68 %
Platelet Count: 96 K/uL — ABNORMAL LOW (ref 150–400)
RBC: 2.2 MIL/uL — ABNORMAL LOW (ref 3.87–5.11)
RDW: 16.6 % — ABNORMAL HIGH (ref 11.5–15.5)
WBC Count: 1.4 K/uL — ABNORMAL LOW (ref 4.0–10.5)
nRBC: 0 % (ref 0.0–0.2)

## 2024-07-05 LAB — CMP (CANCER CENTER ONLY)
ALT: 14 U/L (ref 0–44)
AST: 14 U/L — ABNORMAL LOW (ref 15–41)
Albumin: 3.7 g/dL (ref 3.5–5.0)
Alkaline Phosphatase: 81 U/L (ref 38–126)
Anion gap: 9 (ref 5–15)
BUN: 16 mg/dL (ref 6–20)
CO2: 27 mmol/L (ref 22–32)
Calcium: 9.1 mg/dL (ref 8.9–10.3)
Chloride: 102 mmol/L (ref 98–111)
Creatinine: 0.9 mg/dL (ref 0.44–1.00)
GFR, Estimated: >60 mL/min (ref >60)
Glucose, Bld: 189 mg/dL — ABNORMAL HIGH (ref 70–99)
Potassium: 3.2 mmol/L — ABNORMAL LOW (ref 3.5–5.1)
Sodium: 138 mmol/L (ref 135–145)
Total Bilirubin: 0.4 mg/dL (ref 0.0–1.2)
Total Protein: 7.4 g/dL (ref 6.5–8.1)

## 2024-07-05 LAB — PREPARE RBC (CROSSMATCH)

## 2024-07-05 LAB — MAGNESIUM: Magnesium: 1.6 mg/dL — ABNORMAL LOW (ref 1.7–2.4)

## 2024-07-05 LAB — SAMPLE TO BLOOD BANK

## 2024-07-05 LAB — PHOSPHORUS: Phosphorus: 2.9 mg/dL (ref 2.5–4.6)

## 2024-07-05 MED FILL — Fosaprepitant Dimeglumine For IV Infusion 150 MG (Base Eq): INTRAVENOUS | Qty: 5 | Status: AC

## 2024-07-05 NOTE — Progress Notes (Unsigned)
  Cancer Center Cancer Follow up:    Jessica Clem Pitts, MD [address] 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 KI-67 of 99%    - Pathologic: No stage assigned - Unsigned Specimen type: Core Needle Biopsy Histopathologic type: Infiltrating duct carcinoma, NOS Tumor size (mm): 66 Prognostic indicators: Triple negative. 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)  06/30/2011 Initial Diagnosis   Breast cancer, IDC, Left, Stage III, Triple negative, 7.6 cm breast mass and palpable axillary mass   09/06/2011 Miscellaneous   BRCA 1 and 2: Negative   09/14/2011 - 11/23/2011 Neo-Adjuvant Chemotherapy   Dose dense FEC followed by dose dense Taxotere    12/21/2011 Surgery   Left lumpectomy: High-grade poorly differentiated IDC 1.7 cm with high-grade DCIS, margins negative, 3/7 lymph nodes positive, ER 0%, PR 0%, HER-2 negative ratio 1.33 T1CN1 stage IIb   12/31/2011 - 02/15/2012 Radiation Therapy   Radiation at [facility]   09/26/2017 Relapse/Recurrence   Left breast upper outer quadrant within  the lumpectomy bed: Fibrosis no malignancy, right axillary lymph node biopsy metastatic high-grade carcinoma ER 0%, PR 0%, Ki-67 90%, HER-2 negative ratio 1.11 (similar to previous ductal carcinoma)   11/14/2017 - 03/06/2018 Neo-Adjuvant Chemotherapy   Neo-Adjuvant chemotherapy with Gemzar  and carboplatin  days 1 and 8 every 3 weeks   01/10/2018 Genetic Testing   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  The Hereditary Gene Panel offered by [facility] includes sequencing and/or deletion duplication testing of the following 47 genes: APC, ATM, AXIN2, BARD1, BMPR1A, BRCA1, BRCA2, BRIP1, CDH1, CDK4, CDKN2A (p14ARF), CDKN2A (p16INK4a), CHEK2, CTNNA1, DICER1, EPCAM (Deletion/duplication testing only), GREM1 (promoter region deletion/duplication testing only), KIT, MEN1, MLH1, MSH2, MSH3, MSH6, MUTYH, NBN, NF1, NHTL1, PALB2, PDGFRA, PMS2, POLD1, POLE, PTEN, RAD50, RAD51C, RAD51D, SDHB, SDHC, SDHD, SMAD4, SMARCA4. STK11, TP53, TSC1, TSC2, and VHL.  The following genes were evaluated for sequence changes only: SDHA and HOXB13 c.251G>A variant only. The report date is January 10, 2018.    05/31/2023 Procedure   Mediastinal mass biopsy: Metastatic poorly differentiated carcinoma compatible with breast primary ER +60%, PR 0%, HER2 0, Ki-67 40%    06/02/2023 PET scan   PET/CT Anterior mediastinal mass is hypermetabolic and centrally necrotic, soft tissue lesions anterior to the heart, 10 mm pleural-based lesion, level 2 cervical nodes, hypermetabolism hepatic dome mottled FDG accumulation throughout the skeletal system    06/06/2023 - 07/14/2023 Anti-estrogen oral therapy   Antiestrogen therapy with Verzenio  and letrozole     07/07/2023 Relapse/Recurrence   Increasing mediastinal mass 11.6 cm (used to be 10.8 cm) effacing the heart and involving the sternum, additional mediastinal/pericardial lymph nodes increased right extra  lymph node 1.5 cm, nodules 0.2 cm (used to be 0.8 cm), few  new scattered lung nodules)   07/25/2023 -  Chemotherapy   Patient is on Treatment Plan : BREAST METASTATIC Sacituzumab govitecan -hziy (Trodelvy ) D1,8 q21d     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)  04/06/2018 Surgery   Right axillary lymph node dissection: 1/11 node positive for metastatic high-grade carcinoma   04/25/2018 Initial Diagnosis   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  Jessica Simpson 53 y.o. 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   High degree atrioventricular block 11/15/2023   Secondary malignancy of mediastinal lymph nodes (HCC) 07/20/2023   PMB (postmenopausal bleeding) 01/26/2022   Papanicolaou smear of cervix with positive high risk human papilloma virus (HPV) test 12/25/2020   Routine medical exam 12/19/2020   Encounter for screening fecal occult blood testing 12/19/2020   Hypertension 09/01/2018   Screening for colorectal cancer 09/01/2018   Encounter for gynecological examination with Papanicolaou smear of cervix 09/01/2018   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) 04/25/2018   Genetic testing 01/12/2018   Family history of breast cancer    Port-A-Cath in place 11/21/2017   Mass of axillary tail of right breast 08/24/2017   Pain with urination 08/24/2017   Urinary frequency 08/24/2017   Hematuria 08/24/2017   Encounter for well  woman exam with routine gynecological exam 01/15/2016   Benign cyst of right breast 11/29/2015   Positive fecal occult blood test 07/22/2014   History of breast cancer 07/22/2014   Rectal bleeding 07/10/2014   Lymphedema 06/15/2012   BRCA1 negative 10/22/2011   BRCA2 negative 10/22/2011   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  MEDICAL HISTORY: Past Medical History:  Diagnosis Date   BRCA1 negative 10/22/2011   BRCA2 negative 10/22/2011   Breast cancer, IDC, Left, Stage III, Triple negative 06/30/2011   Breast disorder    Contraceptive education 01/15/2016   Cough    Dyspnea    Family history of breast cancer    Fracture    right 4th toe   History of breast cancer 07/22/2014   History of radiation therapy 05/16/18- 06/22/18   Right Breast/ 50.4 Gy in 28 fractions. Right posterior axilla and SCV nodes/ 50.4 Gy in 28 fractions.    History of radiation therapy    04/16/24 - 04/30/24 Dr. Domino Izell (Breast)   Hypertension    Lymphedema 06/15/2012   Lymphedema of arm    Papanicolaou smear of cervix with positive high risk human papilloma virus (HPV) test 12/25/2020   12/19/2020 repeat pap in 1 year per ASCCP guidelines, 5 year CIN3+risk is 2.25%   Personal history of chemotherapy    Personal history of radiation therapy  Positive fecal occult blood test 07/22/2014   Presence of permanent cardiac pacemaker    S/P radiation therapy 2013   50 gray in 25 fractions to the left breast, supraclavicular, and axillary regions. She then received a boost to the left lumpectomy of 10 gray in 5 fractions. This was given at [facility] [address] .   Sleep apnea    Status post chemotherapy Comp. 11/23/11   FEC and Taxotere     SURGICAL HISTORY: Past Surgical History:  Procedure Laterality Date   anal ascess     turned into a fistula with extensive  treatment   AXILLARY LYMPH NODE BIOPSY Right 04/06/2018   AXILLARY LYMPH NODE DISSECTION Right 04/06/2018   Procedure: RIGHT AXILLARY LYMPH NODE DISSECTION;  Surgeon: Ebbie Cough, MD;  Location: [facility] OR;  Service: General;  Laterality: Right;   BREAST BIOPSY Left 2012   BREAST BIOPSY Right 2019   BREAST LUMPECTOMY Left 2012   BREAST LUMPECTOMY Right 2019   rt axilla   BREAST SURGERY     CARDIAC ELECTROPHYSIOLOGY MAPPING AND ABLATION     CESAREAN SECTION     COLONOSCOPY N/A 08/14/2014   Procedure: COLONOSCOPY;  Surgeon: Claudis RAYMOND Rivet, MD;  Location: [facility];  Service: Endoscopy;  Laterality: N/A;  930   EVACUATION BREAST HEMATOMA  12/21/2011   Procedure: EVACUATION HEMATOMA BREAST;  Surgeon: Jina Nephew, MD;  Location: [facility] OR;  Service: General;  Laterality: Left;   HAND SURGERY     tumor on finger, left hand   INCISION AND DRAINAGE ABSCESS ANAL     x3   IR IMAGING GUIDED PORT INSERTION  08/01/2023   left breast biopsy with axillary biopsy     core bx   PORT-A-CATH REMOVAL  12/21/2011   Procedure: REMOVAL PORT-A-CATH;  Surgeon: Sherlean JINNY Laughter, MD;  Location: [facility];  Service: General;  Laterality: N/A;   PORTACATH PLACEMENT  08/02/2011   dr Laughter PINON PLACEMENT Right 11/14/2017   Procedure: INSERTION PORT-A-CATH WITH US ;  Surgeon: Ebbie Cough, MD;  Location: [facility] OR;  Service: General;  Laterality: Right;   removal breast mass  2004   benign   TREATMENT FISTULA ANAL     TUMOR REMOVAL     on left hand   TUMOR REMOVAL     rt. eye    SOCIAL HISTORY: Social History   Socioeconomic History   Marital status: Single    Spouse name: Not on file   Number of children: Not on file   Years of education: Not on file   Highest education level: Not on file  Occupational History   Not on file  Tobacco Use   Smoking status: Never   Smokeless tobacco: Never  Vaping Use   Vaping status: Never Used  Substance and Sexual Activity    Alcohol use: No   Drug use: No   Sexual activity: Yes    Birth control/protection: Post-menopausal  Other Topics Concern   Not on file  Social History Narrative   Not on file   Social Drivers of Health   Financial Resource Strain: Low Risk  (01/26/2022)   Overall Financial Resource Strain (CARDIA)    Difficulty of Paying Living Expenses: Not very hard  Food Insecurity: No Food Insecurity (04/04/2024)   Hunger Vital Sign    Worried About Running Out of Food in the Last Year: Never true    Ran Out of Food in the Last Year: Never true  Transportation Needs:  No Transportation Needs (04/04/2024)   PRAPARE - Administrator, Civil Service (Medical): No    Lack of Transportation (Non-Medical): No  Physical Activity: Insufficiently Active (01/26/2022)   Exercise Vital Sign    Days of Exercise per Week: 3 days    Minutes of Exercise per Session: 30 min  Stress: No Stress Concern Present (01/26/2022)   [employer] of Occupational Health - Occupational Stress Questionnaire    Feeling of Stress : Only a little  Social Connections: Moderately Integrated (01/26/2022)   Social Connection and Isolation Panel    Frequency of Communication with Friends and Family: More than three times a week    Frequency of Social Gatherings with Friends and Family: Once a week    Attends Religious Services: More than 4 times per year    Active Member of Clubs or Organizations: Yes    Attends Banker Meetings: More than 4 times per year    Marital Status: Never married  Intimate Partner Violence: Not At Risk (04/04/2024)   Humiliation, Afraid, Rape, and Kick questionnaire    Fear of Current or Ex-Partner: No    Emotionally Abused: No    Physically Abused: No    Sexually Abused: No    FAMILY HISTORY: Family History  Problem Relation Age of Onset   Cancer Father 66       lung cancer    Cancer Maternal Grandmother        breast   Cancer Maternal Grandfather    Breast cancer Other         MGMs mother   Colon cancer Neg Hx     Review of Systems - Oncology    PHYSICAL EXAMINATION   Onc Performance Status - 07/05/24 1158       ECOG Perf Status   ECOG Perf Status Restricted in physically strenuous activity but ambulatory and able to carry out work of a light or sedentary nature, e.g., light house work, office work      KPS SCALE   KPS % SCORE Able to carry on normal activity, minor s/s of disease          Vitals:   07/05/24 1155  BP: 114/62  Pulse: (!) 116  Resp: 18  Temp: (!) 97.4 F (36.3 C)  SpO2: 100%    Physical Exam  LABORATORY DATA:  CBC    Component Value Date/Time   WBC 1.4 (L) 07/05/2024 1134   WBC 5.1 05/31/2023 0831   RBC 2.20 (L) 07/05/2024 1134   HGB 6.7 (LL) 07/05/2024 1134   HGB 12.2 07/26/2016 1650   HCT 20.7 (L) 07/05/2024 1134   HCT 37.5 07/26/2016 1650   PLT 96 (L) 07/05/2024 1134   PLT 294 07/26/2016 1650   MCV 94.1 07/05/2024 1134   MCV 80 07/26/2016 1650   MCH 30.5 07/05/2024 1134   MCHC 32.4 07/05/2024 1134   RDW 16.6 (H) 07/05/2024 1134   RDW 14.0 07/26/2016 1650   LYMPHSABS PENDING 07/05/2024 1134   MONOABS PENDING 07/05/2024 1134   EOSABS PENDING 07/05/2024 1134   BASOSABS PENDING 07/05/2024 1134    CMP     Component Value Date/Time   NA 136 06/19/2024 0948   NA 140 07/26/2016 1650   K 3.4 (L) 06/19/2024 0948   CL 101 06/19/2024 0948   CO2 26 06/19/2024 0948   GLUCOSE 106 (H) 06/19/2024 0948   BUN 20 06/19/2024 0948   BUN 13 07/26/2016 1650   CREATININE 0.96 06/19/2024 0948  CALCIUM 9.2 06/19/2024 0948   PROT 7.4 06/19/2024 0948   PROT 7.4 07/26/2016 1650   ALBUMIN 4.1 06/19/2024 0948   ALBUMIN 4.2 07/26/2016 1650   AST 15 06/19/2024 0948   ALT 7 06/19/2024 0948   ALKPHOS 90 06/19/2024 0948   BILITOT 0.5 06/19/2024 0948   GFRNONAA >60 06/19/2024 0948   GFRAA >60 12/21/2018 1402   GFRAA >60 10/24/2018 1053     ASSESSMENT and THERAPY PLAN:   No problem-specific Assessment & Plan notes  found for this encounter.     All questions were answered. The patient knows to call the clinic with any problems, questions or concerns. We can certainly see the patient much sooner if necessary.  Total encounter time:*** minutes*in face-to-face visit time, chart review, lab review, care coordination, order entry, and documentation of the encounter time.    Morna Kendall, NP 07/05/24 12:11 PM Medical Oncology and Hematology [facility] [address] [place], [place] [address] Tel. [phone]    Fax. [phone]  *Total Encounter Time as defined by the Centers for Medicare and Medicaid Services includes, in addition to the face-to-face time of a patient visit (documented in the note above) non-face-to-face time: obtaining and reviewing outside history, ordering and reviewing medications, tests or procedures, care coordination (communications with other health care professionals or caregivers) and documentation in the medical record.

## 2024-07-06 ENCOUNTER — Inpatient Hospital Stay

## 2024-07-06 ENCOUNTER — Encounter: Payer: Self-pay | Admitting: Hematology and Oncology

## 2024-07-06 ENCOUNTER — Encounter (INDEPENDENT_AMBULATORY_CARE_PROVIDER_SITE_OTHER): Payer: Self-pay | Admitting: *Deleted

## 2024-07-06 VITALS — BP 124/71 | HR 100 | Temp 98.3°F | Resp 18

## 2024-07-06 DIAGNOSIS — N6331 Unspecified lump in axillary tail of the right breast: Secondary | ICD-10-CM

## 2024-07-06 DIAGNOSIS — C50919 Malignant neoplasm of unspecified site of unspecified female breast: Secondary | ICD-10-CM

## 2024-07-06 DIAGNOSIS — C50012 Malignant neoplasm of nipple and areola, left female breast: Secondary | ICD-10-CM | POA: Diagnosis not present

## 2024-07-06 DIAGNOSIS — C771 Secondary and unspecified malignant neoplasm of intrathoracic lymph nodes: Secondary | ICD-10-CM

## 2024-07-06 LAB — T4: T4, Total: 8.2 ug/dL (ref 4.5–12.0)

## 2024-07-06 MED ORDER — PALONOSETRON HCL INJECTION 0.25 MG/5ML
0.2500 mg | Freq: Once | INTRAVENOUS | Status: AC
  Administered 2024-07-06: 0.25 mg via INTRAVENOUS
  Filled 2024-07-06: qty 5

## 2024-07-06 MED ORDER — SODIUM CHLORIDE 0.9 % IV SOLN
3.0000 mg/kg | Freq: Once | INTRAVENOUS | Status: AC
  Administered 2024-07-06: 280 mg via INTRAVENOUS
  Filled 2024-07-06: qty 28

## 2024-07-06 MED ORDER — FAMOTIDINE IN NACL 20-0.9 MG/50ML-% IV SOLN
20.0000 mg | Freq: Once | INTRAVENOUS | Status: AC
  Administered 2024-07-06: 20 mg via INTRAVENOUS
  Filled 2024-07-06: qty 50

## 2024-07-06 MED ORDER — DEXAMETHASONE SODIUM PHOSPHATE 10 MG/ML IJ SOLN
10.0000 mg | Freq: Once | INTRAMUSCULAR | Status: AC
  Administered 2024-07-06: 10 mg via INTRAVENOUS
  Filled 2024-07-06: qty 1

## 2024-07-06 MED ORDER — SODIUM CHLORIDE 0.9 % IV SOLN: Freq: Once | INTRAVENOUS | Status: AC

## 2024-07-06 MED ORDER — ACETAMINOPHEN 325 MG PO TABS
650.0000 mg | ORAL_TABLET | Freq: Once | ORAL | Status: AC
  Administered 2024-07-06: 650 mg via ORAL
  Filled 2024-07-06: qty 2

## 2024-07-06 MED ORDER — SODIUM CHLORIDE 0.9% IV SOLUTION
250.0000 mL | INTRAVENOUS | Status: DC
  Administered 2024-07-06: 100 mL via INTRAVENOUS

## 2024-07-06 MED ORDER — DIPHENHYDRAMINE HCL 25 MG PO CAPS: 25.0000 mg | ORAL_CAPSULE | Freq: Once | ORAL | Status: DC

## 2024-07-06 MED ORDER — SODIUM CHLORIDE 0.9% FLUSH: 10.0000 mL | INTRAVENOUS | Status: DC | PRN

## 2024-07-06 MED ORDER — SODIUM CHLORIDE 0.9 % IV SOLN
200.0000 mg | Freq: Once | INTRAVENOUS | Status: AC
  Administered 2024-07-06: 200 mg via INTRAVENOUS
  Filled 2024-07-06: qty 200

## 2024-07-06 MED ORDER — SODIUM CHLORIDE 0.9 % IV SOLN
150.0000 mg | Freq: Once | INTRAVENOUS | Status: AC
  Administered 2024-07-06: 150 mg via INTRAVENOUS
  Filled 2024-07-06: qty 150

## 2024-07-06 MED ORDER — DIPHENHYDRAMINE HCL 50 MG/ML IJ SOLN
50.0000 mg | Freq: Once | INTRAMUSCULAR | Status: AC
  Administered 2024-07-06: 50 mg via INTRAVENOUS
  Filled 2024-07-06: qty 1

## 2024-07-06 MED ORDER — ACETAMINOPHEN 325 MG PO TABS: 650.0000 mg | ORAL_TABLET | Freq: Once | ORAL | Status: DC

## 2024-07-06 NOTE — Patient Instructions (Signed)
 [facility] - A DEPT OF [facility]  Discharge Instructions: Thank you for choosing [facility] to provide your oncology and hematology care.   If you have a lab appointment with the Cancer Center, please go directly to the Cancer Center and check in at the registration area.   Wear comfortable clothing and clothing appropriate for easy access to any Portacath or PICC line.   We strive to give you quality time with your provider. You may need to reschedule your appointment if you arrive late (15 or more minutes).  Arriving late affects you and other patients whose appointments are after yours.  Also, if you miss three or more appointments without notifying the office, you may be dismissed from the clinic at the provider's discretion.      For prescription refill requests, have your pharmacy contact our office and allow 72 hours for refills to be completed.    Today you received the following chemotherapy and/or immunotherapy agents: Keytruda , Trodelvy   and blood   To help prevent nausea and vomiting after your treatment, we encourage you to take your nausea medication as directed.  BELOW ARE SYMPTOMS THAT SHOULD BE REPORTED IMMEDIATELY: *FEVER GREATER THAN 100.4 F (38 C) OR HIGHER *CHILLS OR SWEATING *NAUSEA AND VOMITING THAT IS NOT CONTROLLED WITH YOUR NAUSEA MEDICATION *UNUSUAL SHORTNESS OF BREATH *UNUSUAL BRUISING OR BLEEDING *URINARY PROBLEMS (pain or burning when urinating, or frequent urination) *BOWEL PROBLEMS (unusual diarrhea, constipation, pain near the anus) TENDERNESS IN MOUTH AND THROAT WITH OR WITHOUT PRESENCE OF ULCERS (sore throat, sores in mouth, or a toothache) UNUSUAL RASH, SWELLING OR PAIN  UNUSUAL VAGINAL DISCHARGE OR ITCHING   Items with * indicate a potential emergency and should be followed up as soon as possible or go to the Emergency Department if any problems should occur.  Please show the CHEMOTHERAPY ALERT CARD  or IMMUNOTHERAPY ALERT CARD at check-in to the Emergency Department and triage nurse.  Should you have questions after your visit or need to cancel or reschedule your appointment, please contact [facility] - A DEPT OF [facility]  Dept: [phone]  and follow the prompts.  Office hours are 8:00 a.m. to 4:30 p.m. Monday - Friday. Please note that voicemails left after 4:00 p.m. may not be returned until the following business day.  We are closed weekends and major holidays. You have access to a nurse at all times for urgent questions. Please call the main number to the clinic Dept: [phone] and follow the prompts.   For any non-urgent questions, you may also contact your provider using MyChart. We now offer e-Visits for anyone 27 and older to request care online for non-urgent symptoms. For details visit [email or web address].   Also download the MyChart app! Go to the app store, search MyChart, open the app, select [place], and log in with your MyChart username and password.

## 2024-07-06 NOTE — Assessment & Plan Note (Signed)
 06/30/2011: Left breast cancer stage III triple negative, 7.6 cm 09/14/2011: Dose dense FEC followed by dose dense Taxotere  neoadjuvant chemo 12/21/2011: Left lumpectomy: High-grade poorly differentiated IDC 1.7 cm 3/7 nodes positive triple negative, XRT 09/26/2017: Relapse: Right axillary lymph node biopsy positive for metastatic high-grade carcinoma triple negative 11/14/2017: Neoadjuvant Gemzar  and carboplatin  05/31/2023: Relapse: Mediastinal mass biopsy: Metastatic poorly differentiated carcinoma ER 60%, HER2 negative 06/06/2023: Verzinio with letrozole  07/25/2023: Trodelvy  02/21/2024: Chest wall skin biopsy: Poorly differentiated carcinoma ER 0% PR 0% Ki-67 40%, HER2 0 --------------------------------------------------------------------------------------------------------------------- [facility]is molecular testing: PD-L1 mutation: Adding Keytruda  to Trodelvy  based on clinical trial ASCENT 04 (median PFS 11.2 months versus 7.8 months)  PET-CT: 05/16/2024: Increased metabolic activity throughout both breast suggestive of progressive breast malignancy.  Progressive metastatic nodal disease and soft tissue nodules.  Stable anterior mediastinal soft tissue lymph nodes.  Decreased sternal activity.  Enlarging right middle lobe 6 mm lung metastases.  Increase in peripancreatic, retroperitoneal, para-aortic, aortocaval lymph nodes conglomerate measures 7.1 cm Current Treatment: Trodelvy  and Keytruda  (Keytruda  added on 05/22/2024) Toxicities: Anemia:  1 unit of blood tomorrow, Aranesp  next week.   Thrombocytopenia:   Neutropenia: Receiving Granix  injections on day 6 of therapy, Neulasta  on day 9, Dr. Gudena slightly dose reduced Trodelvy .   Breast wound: dressed by nursing.  I noted it could be progression of her cancer, will monitor it next week when we see her prior to treatment.    Reviewed the above with Dr. Odean in detail.  HE is in agreement with the assessment and plan and helped to formulate it.     Repeat  PET in 07/2024

## 2024-07-06 NOTE — Progress Notes (Signed)
 All patients labs were reviewed by this RN and pharmacy -reinforced by Morna Kendall and Dr. Gudena that today's treatment is appropriate. To address the magnesium  patient is taking oral Mg at home. To address the hemoglobin, the patient is to receive a 1 unit blood transfusion after treatment today.  Aranesp  next week.

## 2024-07-09 ENCOUNTER — Inpatient Hospital Stay

## 2024-07-09 ENCOUNTER — Encounter: Payer: Self-pay | Admitting: Hematology and Oncology

## 2024-07-09 ENCOUNTER — Other Ambulatory Visit: Payer: Self-pay | Admitting: Hematology and Oncology

## 2024-07-09 DIAGNOSIS — N6331 Unspecified lump in axillary tail of the right breast: Secondary | ICD-10-CM

## 2024-07-09 DIAGNOSIS — C50012 Malignant neoplasm of nipple and areola, left female breast: Secondary | ICD-10-CM | POA: Diagnosis not present

## 2024-07-09 DIAGNOSIS — C50919 Malignant neoplasm of unspecified site of unspecified female breast: Secondary | ICD-10-CM

## 2024-07-09 LAB — BPAM RBC
Blood Product Expiration Date: 202509172359
ISSUE DATE / TIME: 202509121355
Unit Type and Rh: 5100

## 2024-07-09 LAB — TYPE AND SCREEN
ABO/RH(D): O POS
Antibody Screen: NEGATIVE
Unit division: 0

## 2024-07-09 MED ORDER — FILGRASTIM-SNDZ 480 MCG/0.8ML IJ SOSY
480.0000 ug | PREFILLED_SYRINGE | Freq: Once | INTRAMUSCULAR | Status: AC
  Administered 2024-07-09: 480 ug via SUBCUTANEOUS

## 2024-07-09 MED FILL — Fosaprepitant Dimeglumine For IV Infusion 150 MG (Base Eq): INTRAVENOUS | Qty: 5 | Status: AC

## 2024-07-10 ENCOUNTER — Inpatient Hospital Stay

## 2024-07-10 ENCOUNTER — Inpatient Hospital Stay: Admitting: Adult Health

## 2024-07-11 ENCOUNTER — Inpatient Hospital Stay

## 2024-07-12 MED FILL — Fosaprepitant Dimeglumine For IV Infusion 150 MG (Base Eq): INTRAVENOUS | Qty: 5 | Status: AC

## 2024-07-13 ENCOUNTER — Ambulatory Visit

## 2024-07-13 ENCOUNTER — Other Ambulatory Visit (HOSPITAL_COMMUNITY): Payer: Self-pay

## 2024-07-13 ENCOUNTER — Ambulatory Visit: Admitting: Adult Health

## 2024-07-13 ENCOUNTER — Encounter: Payer: Self-pay | Admitting: General Practice

## 2024-07-13 ENCOUNTER — Encounter: Payer: Self-pay | Admitting: Adult Health

## 2024-07-13 ENCOUNTER — Encounter: Payer: Self-pay | Admitting: Hematology and Oncology

## 2024-07-13 VITALS — HR 99

## 2024-07-13 VITALS — BP 118/88 | HR 113 | Temp 97.4°F | Resp 18 | Ht 66.0 in | Wt 203.8 lb

## 2024-07-13 DIAGNOSIS — C771 Secondary and unspecified malignant neoplasm of intrathoracic lymph nodes: Secondary | ICD-10-CM

## 2024-07-13 DIAGNOSIS — C50919 Malignant neoplasm of unspecified site of unspecified female breast: Secondary | ICD-10-CM

## 2024-07-13 DIAGNOSIS — N6331 Unspecified lump in axillary tail of the right breast: Secondary | ICD-10-CM

## 2024-07-13 DIAGNOSIS — Z95828 Presence of other vascular implants and grafts: Secondary | ICD-10-CM

## 2024-07-13 DIAGNOSIS — C50012 Malignant neoplasm of nipple and areola, left female breast: Secondary | ICD-10-CM | POA: Diagnosis not present

## 2024-07-13 LAB — CMP (CANCER CENTER ONLY)
ALT: 12 U/L (ref 0–44)
AST: 16 U/L (ref 15–41)
Albumin: 3.8 g/dL (ref 3.5–5.0)
Alkaline Phosphatase: 90 U/L (ref 38–126)
Anion gap: 8 (ref 5–15)
BUN: 16 mg/dL (ref 6–20)
CO2: 27 mmol/L (ref 22–32)
Calcium: 9.1 mg/dL (ref 8.9–10.3)
Chloride: 102 mmol/L (ref 98–111)
Creatinine: 0.74 mg/dL (ref 0.44–1.00)
GFR, Estimated: >60 mL/min (ref >60)
Glucose, Bld: 88 mg/dL (ref 70–99)
Potassium: 3.7 mmol/L (ref 3.5–5.1)
Sodium: 137 mmol/L (ref 135–145)
Total Bilirubin: 0.4 mg/dL (ref 0.0–1.2)
Total Protein: 7.2 g/dL (ref 6.5–8.1)

## 2024-07-13 LAB — CBC WITH DIFFERENTIAL (CANCER CENTER ONLY)
Abs Immature Granulocytes: 0.15 K/uL — ABNORMAL HIGH (ref 0.00–0.07)
Basophils Absolute: 0 K/uL (ref 0.0–0.1)
Basophils Relative: 1 %
Eosinophils Absolute: 0 K/uL (ref 0.0–0.5)
Eosinophils Relative: 1 %
HCT: 23.9 % — ABNORMAL LOW (ref 36.0–46.0)
Hemoglobin: 8 g/dL — ABNORMAL LOW (ref 12.0–15.0)
Immature Granulocytes: 9 %
Lymphocytes Relative: 15 %
Lymphs Abs: 0.2 K/uL — ABNORMAL LOW (ref 0.7–4.0)
MCH: 31.1 pg (ref 26.0–34.0)
MCHC: 33.5 g/dL (ref 30.0–36.0)
MCV: 93 fL (ref 80.0–100.0)
Monocytes Absolute: 0.2 K/uL (ref 0.1–1.0)
Monocytes Relative: 15 %
Neutro Abs: 1 K/uL — ABNORMAL LOW (ref 1.7–7.7)
Neutrophils Relative %: 59 %
Platelet Count: 79 K/uL — ABNORMAL LOW (ref 150–400)
RBC: 2.57 MIL/uL — ABNORMAL LOW (ref 3.87–5.11)
RDW: 16.8 % — ABNORMAL HIGH (ref 11.5–15.5)
WBC Count: 1.6 K/uL — ABNORMAL LOW (ref 4.0–10.5)
nRBC: 0 % (ref 0.0–0.2)

## 2024-07-13 LAB — PHOSPHORUS: Phosphorus: 3.9 mg/dL (ref 2.5–4.6)

## 2024-07-13 LAB — SAMPLE TO BLOOD BANK

## 2024-07-13 LAB — MAGNESIUM: Magnesium: 1.8 mg/dL (ref 1.7–2.4)

## 2024-07-13 MED ORDER — ACETAMINOPHEN 325 MG PO TABS
650.0000 mg | ORAL_TABLET | Freq: Once | ORAL | Status: AC
  Administered 2024-07-13: 650 mg via ORAL
  Filled 2024-07-13: qty 2

## 2024-07-13 MED ORDER — FAMOTIDINE IN NACL 20-0.9 MG/50ML-% IV SOLN
20.0000 mg | Freq: Once | INTRAVENOUS | Status: AC
  Administered 2024-07-13: 20 mg via INTRAVENOUS
  Filled 2024-07-13: qty 50

## 2024-07-13 MED ORDER — PEGFILGRASTIM 6 MG/0.6ML ~~LOC~~ PSKT
6.0000 mg | PREFILLED_SYRINGE | Freq: Once | SUBCUTANEOUS | Status: AC
  Administered 2024-07-13: 6 mg via SUBCUTANEOUS
  Filled 2024-07-13: qty 0.6

## 2024-07-13 MED ORDER — ATROPINE SULFATE 1 MG/ML IV SOLN
0.5000 mg | Freq: Once | INTRAVENOUS | Status: AC | PRN
  Administered 2024-07-13: 0.5 mg via INTRAVENOUS
  Filled 2024-07-13: qty 1

## 2024-07-13 MED ORDER — DARBEPOETIN ALFA 300 MCG/0.6ML IJ SOSY
300.0000 ug | PREFILLED_SYRINGE | Freq: Once | INTRAMUSCULAR | Status: AC
  Administered 2024-07-13: 300 ug via SUBCUTANEOUS
  Filled 2024-07-13: qty 0.6

## 2024-07-13 MED ORDER — DIPHENHYDRAMINE HCL 50 MG/ML IJ SOLN
50.0000 mg | Freq: Once | INTRAMUSCULAR | Status: AC
  Administered 2024-07-13: 50 mg via INTRAVENOUS
  Filled 2024-07-13: qty 1

## 2024-07-13 MED ORDER — DEXAMETHASONE SODIUM PHOSPHATE 10 MG/ML IJ SOLN
10.0000 mg | Freq: Once | INTRAMUSCULAR | Status: AC
  Administered 2024-07-13: 10 mg via INTRAVENOUS
  Filled 2024-07-13: qty 1

## 2024-07-13 MED ORDER — SODIUM CHLORIDE 0.9 % IV SOLN
150.0000 mg | Freq: Once | INTRAVENOUS | Status: AC
  Administered 2024-07-13: 150 mg via INTRAVENOUS
  Filled 2024-07-13: qty 5
  Filled 2024-07-13: qty 150

## 2024-07-13 MED ORDER — METRONIDAZOLE 0.75 % EX GEL
1.0000 | Freq: Two times a day (BID) | CUTANEOUS | 0 refills | Status: DC
  Filled 2024-07-13 (×2): qty 45, 23d supply, fill #0

## 2024-07-13 MED ORDER — SODIUM CHLORIDE 0.9 % IV SOLN: Freq: Once | INTRAVENOUS | Status: AC

## 2024-07-13 MED ORDER — PALONOSETRON HCL INJECTION 0.25 MG/5ML
0.2500 mg | Freq: Once | INTRAVENOUS | Status: AC
  Administered 2024-07-13: 0.25 mg via INTRAVENOUS
  Filled 2024-07-13: qty 5

## 2024-07-13 MED ORDER — SODIUM CHLORIDE 0.9 % IV SOLN
3.0000 mg/kg | Freq: Once | INTRAVENOUS | Status: AC
  Administered 2024-07-13: 280 mg via INTRAVENOUS
  Filled 2024-07-13: qty 28

## 2024-07-13 NOTE — Progress Notes (Signed)
 Due to patient preference, patient will receive Neulasta  OnPro on Day 8 with Trodelvy  per Morna, NP okay. Day 9 removed from treatment plan and scheduler alerted to cancel future Neulasta  injection appointments.  Harlene Nasuti, PharmD Oncology Infusion Pharmacist 07/13/2024 1:47 PM

## 2024-07-13 NOTE — Progress Notes (Signed)
 [facility] Spiritual Care Note  Referred by Morna Causey/NP per Ms Toole's request. Visited in infusion, but Ms Bauch was very sleepy; she has chaplain's card with direct Spiritual Care number and knows to call anytime. We also plan follow-up earlier in a future treatment.  [address] [place], [place], [facility] Pager [phone] Voicemail [phone]

## 2024-07-13 NOTE — Patient Instructions (Signed)
 [facility] - A DEPT OF [facility]  Discharge Instructions: Thank you for choosing [facility] to provide your oncology and hematology care.   If you have a lab appointment with the Cancer Center, please go directly to the Cancer Center and check in at the registration area.   Wear comfortable clothing and clothing appropriate for easy access to any Portacath or PICC line.   We strive to give you quality time with your provider. You may need to reschedule your appointment if you arrive late (15 or more minutes).  Arriving late affects you and other patients whose appointments are after yours.  Also, if you miss three or more appointments without notifying the office, you may be dismissed from the clinic at the provider's discretion.      For prescription refill requests, have your pharmacy contact our office and allow 72 hours for refills to be completed.    Today you received the following chemotherapy and/or immunotherapy agents: Jessica Simpson      To help prevent nausea and vomiting after your treatment, we encourage you to take your nausea medication as directed.  BELOW ARE SYMPTOMS THAT SHOULD BE REPORTED IMMEDIATELY: *FEVER GREATER THAN 100.4 F (38 C) OR HIGHER *CHILLS OR SWEATING *NAUSEA AND VOMITING THAT IS NOT CONTROLLED WITH YOUR NAUSEA MEDICATION *UNUSUAL SHORTNESS OF BREATH *UNUSUAL BRUISING OR BLEEDING *URINARY PROBLEMS (pain or burning when urinating, or frequent urination) *BOWEL PROBLEMS (unusual diarrhea, constipation, pain near the anus) TENDERNESS IN MOUTH AND THROAT WITH OR WITHOUT PRESENCE OF ULCERS (sore throat, sores in mouth, or a toothache) UNUSUAL RASH, SWELLING OR PAIN  UNUSUAL VAGINAL DISCHARGE OR ITCHING   Items with * indicate a potential emergency and should be followed up as soon as possible or go to the Emergency Department if any problems should occur.  Please show the CHEMOTHERAPY ALERT CARD or IMMUNOTHERAPY  ALERT CARD at check-in to the Emergency Department and triage nurse.  Should you have questions after your visit or need to cancel or reschedule your appointment, please contact [facility] - A DEPT OF [facility]  Dept: [phone]  and follow the prompts.  Office hours are 8:00 a.m. to 4:30 p.m. Monday - Friday. Please note that voicemails left after 4:00 p.m. may not be returned until the following business day.  We are closed weekends and major holidays. You have access to a nurse at all times for urgent questions. Please call the main number to the clinic Dept: [phone] and follow the prompts.   For any non-urgent questions, you may also contact your provider using MyChart. We now offer e-Visits for anyone 66 and older to request care online for non-urgent symptoms. For details visit [email or web address].   Also download the MyChart app! Go to the app store, search "MyChart", open the app, select [facility], and log in with your MyChart username and password.

## 2024-07-13 NOTE — Progress Notes (Signed)
 [facility] Cancer Follow up:    [name], MD [address] 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 KI-67 of 99%    - Pathologic: No stage assigned - Unsigned Specimen type: Core Needle Biopsy Histopathologic type: Infiltrating duct carcinoma, NOS Tumor size (mm): 66 Prognostic indicators: Triple negative. 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)  06/30/2011 Initial Diagnosis   Breast cancer, IDC, Left, Stage III, Triple negative, 7.6 cm breast mass and palpable axillary mass   09/06/2011 Miscellaneous   BRCA 1 and 2: Negative   09/14/2011 - 11/23/2011 Neo-Adjuvant Chemotherapy   Dose dense FEC followed by dose dense Taxotere    12/21/2011 Surgery   Left lumpectomy: High-grade poorly differentiated IDC 1.7 cm with high-grade DCIS, margins negative, 3/7 lymph nodes positive, ER 0%, PR 0%, HER-2 negative ratio 1.33 T1CN1 stage IIb   12/31/2011 - 02/15/2012 Radiation Therapy   Radiation at [facility]   09/26/2017 Relapse/Recurrence   Left breast upper outer quadrant  within the lumpectomy bed: Fibrosis no malignancy, right axillary lymph node biopsy metastatic high-grade carcinoma ER 0%, PR 0%, Ki-67 90%, HER-2 negative ratio 1.11 (similar to previous ductal carcinoma)   11/14/2017 - 03/06/2018 Neo-Adjuvant Chemotherapy   Neo-Adjuvant chemotherapy with Gemzar  and carboplatin  days 1 and 8 every 3 weeks   01/10/2018 Genetic Testing   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  The Hereditary Gene Panel offered by [facility] includes sequencing and/or deletion duplication testing of the following 47 genes: APC, ATM, AXIN2, BARD1, BMPR1A, BRCA1, BRCA2, BRIP1, CDH1, CDK4, CDKN2A (p14ARF), CDKN2A (p16INK4a), CHEK2, CTNNA1, DICER1, EPCAM (Deletion/duplication testing only), GREM1 (promoter region deletion/duplication testing only), KIT, MEN1, MLH1, MSH2, MSH3, MSH6, MUTYH, NBN, NF1, NHTL1, PALB2, PDGFRA, PMS2, POLD1, POLE, PTEN, RAD50, RAD51C, RAD51D, SDHB, SDHC, SDHD, SMAD4, SMARCA4. STK11, TP53, TSC1, TSC2, and VHL.  The following genes were evaluated for sequence changes only: SDHA and HOXB13 c.251G>A variant only. The report date is January 10, 2018.    05/31/2023 Procedure   Mediastinal mass biopsy: Metastatic poorly differentiated carcinoma compatible with breast primary ER +60%, PR 0%, HER2 0, Ki-67 40%    06/02/2023 PET scan   PET/CT Anterior mediastinal mass is hypermetabolic and centrally necrotic, soft tissue lesions anterior to the heart, 10 mm pleural-based lesion, level 2 cervical nodes, hypermetabolism hepatic dome mottled FDG accumulation throughout the skeletal system    06/06/2023 - 07/14/2023 Anti-estrogen oral therapy   Antiestrogen therapy with Verzenio  and letrozole     07/07/2023 Relapse/Recurrence   Increasing mediastinal mass 11.6 cm (used to be 10.8 cm) effacing the heart and involving the sternum, additional mediastinal/pericardial lymph nodes increased right  extra lymph node 1.5 cm, nodules 0.2 cm (used to be 0.8  cm), few new scattered lung nodules)   07/25/2023 -  Chemotherapy   Patient is on Treatment Plan : BREAST METASTATIC Sacituzumab govitecan -hziy (Trodelvy ) D1,8 q21d     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)  04/06/2018 Surgery   Right axillary lymph node dissection: 1/11 node positive for metastatic high-grade carcinoma   04/25/2018 Initial Diagnosis   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  History of Present Illness Jessica Simpson is a 53 year old female with metastatic breast cancer who presents for follow-up and evaluation of her treatment with Trodelvy  and Keytruda .  She is on a 21-day cycle of Trodelvy  and Keytruda , receiving Trodelvy  on days one and eight, and Keytruda  on day one. Today, she is scheduled for Trodelvy  alone. She experiences fatigue and numbness in her fingertips that is intermittent..  She has worsening drainage and a concerning smell from her breast, for which she applies saline soaked bandages.  Her recent lab work shows an ANC of 1.0, and she received a shot to boost her neutrophil count. She felt better over the weekend but is experiencing increased fatigue again. She has no cough or palpitations but does experience some shortness of breath.  Last week she received 1 unit of blood and her treatment.  Her hemoglobin improved from 6.7 to 8.  She also received Granix  (or biosimilar) on Monday to boost her WBC.        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   High degree atrioventricular block 11/15/2023   Secondary malignancy of mediastinal lymph nodes (HCC) 07/20/2023   PMB (postmenopausal bleeding) 01/26/2022   Papanicolaou smear of cervix with positive high risk human papilloma virus (HPV) test 12/25/2020   Routine medical exam 12/19/2020   Encounter for screening fecal occult blood testing 12/19/2020   Hypertension 09/01/2018   Screening for colorectal cancer 09/01/2018   Encounter for gynecological examination with Papanicolaou smear of cervix 09/01/2018   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) 04/25/2018   Genetic testing 01/12/2018   Family history of breast cancer    Port-A-Cath in place 11/21/2017   Mass of axillary tail of right breast 08/24/2017   Pain with urination 08/24/2017   Urinary frequency 08/24/2017   Hematuria 08/24/2017   Encounter for well woman exam with routine gynecological exam 01/15/2016   Benign cyst of right breast 11/29/2015   Positive fecal occult blood test 07/22/2014   History of breast cancer 07/22/2014   Rectal bleeding 07/10/2014   Lymphedema 06/15/2012   BRCA1 negative 10/22/2011   BRCA2 negative 10/22/2011   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  MEDICAL HISTORY: Past Medical History:  Diagnosis Date   BRCA1 negative 10/22/2011  BRCA2 negative 10/22/2011   Breast cancer, IDC, Left, Stage III, Triple negative 06/30/2011   Breast disorder    Contraceptive education 01/15/2016   Cough    Dyspnea    Family history of breast cancer    Fracture    right 4th toe   History of breast cancer 07/22/2014   History of radiation therapy 05/16/18- 06/22/18   Right Breast/ 50.4 Gy in 28 fractions. Right posterior axilla and SCV nodes/ 50.4 Gy in 28 fractions.     History of radiation therapy    04/16/24 - 04/30/24 Dr. 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 She then received a boost to the left lumpectomy of 10 gray in 5 fractions. This was given at [facility] .   Sleep apnea    Status post chemotherapy Comp. 11/23/11   FEC and Taxotere     SURGICAL HISTORY: Past Surgical History:  Procedure Laterality Date   anal ascess     turned into a fistula with extensive treatment   AXILLARY LYMPH NODE BIOPSY Right 04/06/2018   AXILLARY LYMPH NODE DISSECTION Right 04/06/2018   Procedure: RIGHT AXILLARY LYMPH NODE DISSECTION;  Surgeon: Ebbie Cough, MD;  Location: [facility] OR;  Service: General;  Laterality: Right;   BREAST BIOPSY Left 2012   BREAST BIOPSY Right 2019   BREAST LUMPECTOMY Left 2012   BREAST LUMPECTOMY Right 2019   rt axilla   BREAST SURGERY     CARDIAC ELECTROPHYSIOLOGY MAPPING AND ABLATION     CESAREAN SECTION     COLONOSCOPY N/A 08/14/2014   Procedure: COLONOSCOPY;  Surgeon: Claudis RAYMOND Rivet, MD;  Location: [facility];  Service: Endoscopy;  Laterality: N/A;  930   EVACUATION BREAST HEMATOMA  12/21/2011   Procedure: EVACUATION HEMATOMA BREAST;  Surgeon: Jina Nephew, MD;  Location: [facility] OR;  Service: General;  Laterality: Left;   HAND SURGERY     tumor on finger, left hand   INCISION AND DRAINAGE ABSCESS ANAL     x3   IR IMAGING GUIDED PORT INSERTION  08/01/2023   left breast biopsy with axillary biopsy      core bx   PORT-A-CATH REMOVAL  12/21/2011   Procedure: REMOVAL PORT-A-CATH;  Surgeon: Sherlean JINNY Laughter, MD;  Location: [facility];  Service: General;  Laterality: N/A;   PORTACATH PLACEMENT  08/02/2011   dr Laughter   PORTACATH PLACEMENT Right 11/14/2017   Procedure: INSERTION PORT-A-CATH WITH US ;  Surgeon: Ebbie Cough, MD;  Location: [facility] OR;  Service: General;  Laterality: Right;   removal breast mass  2004   benign   TREATMENT FISTULA ANAL     TUMOR REMOVAL     on left hand   TUMOR REMOVAL     rt. eye    SOCIAL HISTORY: Social History   Socioeconomic History   Marital status: Single    Spouse name: Not on file   Number of children: Not on file  Years of education: Not on file   Highest education level: Not on file  Occupational History   Not on file  Tobacco Use   Smoking status: Never   Smokeless tobacco: Never  Vaping Use   Vaping status: Never Used  Substance and Sexual Activity   Alcohol use: No   Drug use: No   Sexual activity: Yes    Birth control/protection: Post-menopausal  Other Topics Concern   Not on file  Social History Narrative   Not on file   Social Drivers of Health   Financial Resource Strain: Low Risk  (01/26/2022)   Overall Financial Resource Strain (CARDIA)    Difficulty of Paying Living Expenses: Not very hard  Food Insecurity: No Food Insecurity (04/04/2024)   Hunger Vital Sign    Worried About Running Out of Food in the Last Year: Never true    Ran Out of Food in the Last Year: Never true  Transportation Needs: No Transportation Needs (04/04/2024)   PRAPARE - Administrator, Civil Service (Medical): No    Lack of Transportation (Non-Medical): No  Physical Activity: Insufficiently Active (01/26/2022)   Exercise Vital Sign    Days of Exercise per Week: 3 days    Minutes of Exercise per Session: 30 min  Stress: No Stress Concern Present (01/26/2022)   [employer] of Occupational Health - Occupational  Stress Questionnaire    Feeling of Stress : Only a little  Social Connections: Moderately Integrated (01/26/2022)   Social Connection and Isolation Panel    Frequency of Communication with Friends and Family: More than three times a week    Frequency of Social Gatherings with Friends and Family: Once a week    Attends Religious Services: More than 4 times per year    Active Member of [employer] or Organizations: Yes    Attends Engineer, structural: More than 4 times per year    Marital Status: Never married  Intimate Partner Violence: Not At Risk (04/04/2024)   Humiliation, Afraid, Rape, and Kick questionnaire    Fear of Current or Ex-Partner: No    Emotionally Abused: No    Physically Abused: No    Sexually Abused: No    FAMILY HISTORY: Family History  Problem Relation Age of Onset   Cancer Father 38       lung cancer    Cancer Maternal Grandmother        breast   Cancer Maternal Grandfather    Breast cancer Other        MGMs mother   Colon cancer Neg Hx     Review of Systems  Constitutional:  Positive for fatigue. Negative for appetite change, chills, fever and unexpected weight change.  HENT:   Negative for hearing loss, lump/mass and trouble swallowing.   Eyes:  Negative for eye problems and icterus.  Respiratory:  Negative for chest tightness, cough and shortness of breath.   Cardiovascular:  Negative for chest pain, leg swelling and palpitations.  Gastrointestinal:  Negative for abdominal distention, abdominal pain, constipation, diarrhea, nausea and vomiting.  Endocrine: Negative for hot flashes.  Genitourinary:  Negative for difficulty urinating.   Musculoskeletal:  Negative for arthralgias.  Skin:  Positive for wound. Negative for itching and rash.  Neurological:  Positive for numbness. Negative for dizziness, extremity weakness and headaches.  Hematological:  Negative for adenopathy. Does not bruise/bleed easily.  Psychiatric/Behavioral:  Negative for  depression. The patient is not nervous/anxious.       PHYSICAL  EXAMINATION   Onc Performance Status - 07/13/24 1200       KPS SCALE   KPS % SCORE Able to carry on normal activity, minor s/s of disease          Vitals:   07/13/24 1201  BP: 118/88  Pulse: (!) 113  Resp: 18  Temp: (!) 97.4 F (36.3 C)  SpO2: 100%    Physical Exam Constitutional:      General: She is not in acute distress.    Appearance: Normal appearance. She is not toxic-appearing.  HENT:     Head: Normocephalic and atraumatic.     Mouth/Throat:     Mouth: Mucous membranes are moist.     Pharynx: Oropharynx is clear. No oropharyngeal exudate or posterior oropharyngeal erythema.  Eyes:     General: No scleral icterus. Cardiovascular:     Rate and Rhythm: Normal rate and regular rhythm.     Pulses: Normal pulses.     Heart sounds: Normal heart sounds.  Pulmonary:     Effort: Pulmonary effort is normal.     Breath sounds: Normal breath sounds.  Abdominal:     General: Abdomen is flat. Bowel sounds are normal. There is no distension.     Palpations: Abdomen is soft.     Tenderness: There is no abdominal tenderness.  Musculoskeletal:        General: No swelling.     Cervical back: Neck supple.  Lymphadenopathy:     Cervical: No cervical adenopathy.  Skin:    General: Skin is warm and dry.     Findings: No rash.  Neurological:     General: No focal deficit present.     Mental Status: She is alert.  Psychiatric:        Mood and Affect: Mood normal.        Behavior: Behavior normal.   Breast on 07/06/2024   Breast on 07/13/2024   LABORATORY DATA:  CBC    Component Value Date/Time   WBC 1.6 (L) 07/13/2024 1128   WBC 5.1 05/31/2023 0831   RBC 2.57 (L) 07/13/2024 1128   HGB 8.0 (L) 07/13/2024 1128   HGB 12.2 07/26/2016 1650   HCT 23.9 (L) 07/13/2024 1128   HCT 37.5 07/26/2016 1650   PLT 79 (L) 07/13/2024 1128   PLT 294 07/26/2016 1650   MCV 93.0 07/13/2024 1128   MCV 80 07/26/2016  1650   MCH 31.1 07/13/2024 1128   MCHC 33.5 07/13/2024 1128   RDW 16.8 (H) 07/13/2024 1128   RDW 14.0 07/26/2016 1650   LYMPHSABS 0.2 (L) 07/13/2024 1128   MONOABS 0.2 07/13/2024 1128   EOSABS 0.0 07/13/2024 1128   BASOSABS 0.0 07/13/2024 1128    CMP     Component Value Date/Time   NA 137 07/13/2024 1128   NA 140 07/26/2016 1650   K 3.7 07/13/2024 1128   CL 102 07/13/2024 1128   CO2 27 07/13/2024 1128   GLUCOSE 88 07/13/2024 1128   BUN 16 07/13/2024 1128   BUN 13 07/26/2016 1650   CREATININE 0.74 07/13/2024 1128   CALCIUM 9.1 07/13/2024 1128   PROT 7.2 07/13/2024 1128   PROT 7.4 07/26/2016 1650   ALBUMIN 3.8 07/13/2024 1128   ALBUMIN 4.2 07/26/2016 1650   AST 16 07/13/2024 1128   ALT 12 07/13/2024 1128   ALKPHOS 90 07/13/2024 1128   BILITOT 0.4 07/13/2024 1128   GFRNONAA >60 07/13/2024 1128   GFRAA >60 12/21/2018 1402   GFRAA >60 10/24/2018  1053     ASSESSMENT and THERAPY PLAN:   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) 06/30/2011: Left breast cancer stage III triple negative, 7.6 cm 09/14/2011: Dose dense FEC followed by dose dense Taxotere  neoadjuvant chemo 12/21/2011: Left lumpectomy: High-grade poorly differentiated IDC 1.7 cm 3/7 nodes positive triple negative, XRT 09/26/2017: Relapse: Right axillary lymph node biopsy positive for metastatic high-grade carcinoma triple negative 11/14/2017: Neoadjuvant Gemzar  and carboplatin  05/31/2023: Relapse: Mediastinal mass biopsy: Metastatic poorly differentiated carcinoma ER 60%, HER2 negative 06/06/2023: Verzinio with letrozole  07/25/2023: Trodelvy  02/21/2024: Chest wall skin biopsy: Poorly differentiated carcinoma ER 0% PR 0% Ki-67 40%, HER2 0 --------------------------------------------------------------------------------------------------------------------- Caris molecular testing: PD-L1 mutation: Adding Keytruda  to Trodelvy  based on clinical trial ASCENT 04 (median PFS 11.2 months versus 7.8 months)   PET-CT: 05/16/2024: Increased metabolic activity throughout both breast suggestive of progressive breast malignancy.  Progressive metastatic nodal disease and soft tissue nodules.  Stable anterior mediastinal soft tissue lymph nodes.  Decreased sternal activity.  Enlarging right middle lobe 6 mm lung metastases.  Increase in peripancreatic, retroperitoneal, para-aortic, aortocaval lymph nodes conglomerate measures 7.1 cm Current Treatment: Trodelvy  and Keytruda  (Keytruda  added on 05/22/2024) Assessment and Plan Assessment & Plan Metastatic breast cancer Progressive breast lesion with increased open areas and split around the nipple, suggestive of cancer progression. Awaiting PET scan results to confirm and assess disease progression. - Administer Trodelvy  as scheduled. - Proceed with PET scan to assess disease progression--offered to move up to sooner date, patient declined - Apply Flagyl  topically to the breast twice daily with bandage changes.  Chemotherapy-induced neutropenia Neutrophil count at 1.0, allowing continuation of chemotherapy. -Continue with Onpro on day 9   Chemotherapy-induced anemia Anemia managed with recent transfusion, resulting in improved symptoms. Hemoglobin levels are well-managed. -Continue to monitor hemoglobin and transfuse when indicated  Chemotherapy-induced peripheral neuropathy Intermittent numbness in fingertips, likely related to chemotherapy regimen. -If becomes constant will need to adjust treatment.   Emotional distress related to cancer diagnosis and treatment Experiencing emotional distress and moments of feeling overwhelmed due to cancer diagnosis and treatment. - Encourage reconnection with chaplain for emotional support. - Discuss quality of life and goals of care to align treatment with personal values.  RTC on 10/7 for abs, f/u with Dr. Gudena and treatment.      All questions were answered. The patient knows to call the clinic with any  problems, questions or concerns. We can certainly see the patient much sooner if necessary.  Total encounter time:45 minutes*in face-to-face visit time, chart review, lab review, care coordination, order entry, and documentation of the encounter time.    Morna Kendall, NP 07/13/24 3:01 PM Medical Oncology and Hematology [facility] [address] [place], [place] [address] Tel. [phone]    Fax. [phone]  *Total Encounter Time as defined by the Centers for Medicare and Medicaid Services includes, in addition to the face-to-face time of a patient visit (documented in the note above) non-face-to-face time: obtaining and reviewing outside history, ordering and reviewing medications, tests or procedures, care coordination (communications with other health care professionals or caregivers) and documentation in the medical record.

## 2024-07-13 NOTE — Assessment & Plan Note (Signed)
 06/30/2011: Left breast cancer stage III triple negative, 7.6 cm 09/14/2011: Dose dense FEC followed by dose dense Taxotere  neoadjuvant chemo 12/21/2011: Left lumpectomy: High-grade poorly differentiated IDC 1.7 cm 3/7 nodes positive triple negative, XRT 09/26/2017: Relapse: Right axillary lymph node biopsy positive for metastatic high-grade carcinoma triple negative 11/14/2017: Neoadjuvant Gemzar  and carboplatin  05/31/2023: Relapse: Mediastinal mass biopsy: Metastatic poorly differentiated carcinoma ER 60%, HER2 negative 06/06/2023: Verzinio with letrozole  07/25/2023: Trodelvy  02/21/2024: Chest wall skin biopsy: Poorly differentiated carcinoma ER 0% PR 0% Ki-67 40%, HER2 0 --------------------------------------------------------------------------------------------------------------------- Caris molecular testing: PD-L1 mutation: Adding Keytruda  to Trodelvy  based on clinical trial ASCENT 04 (median PFS 11.2 months versus 7.8 months)  PET-CT: 05/16/2024: Increased metabolic activity throughout both breast suggestive of progressive breast malignancy.  Progressive metastatic nodal disease and soft tissue nodules.  Stable anterior mediastinal soft tissue lymph nodes.  Decreased sternal activity.  Enlarging right middle lobe 6 mm lung metastases.  Increase in peripancreatic, retroperitoneal, para-aortic, aortocaval lymph nodes conglomerate measures 7.1 cm Current Treatment: Trodelvy  and Keytruda  (Keytruda  added on 05/22/2024)

## 2024-07-16 ENCOUNTER — Telehealth: Payer: Self-pay | Admitting: *Deleted

## 2024-07-16 ENCOUNTER — Other Ambulatory Visit: Payer: Self-pay | Admitting: Adult Health

## 2024-07-16 ENCOUNTER — Ambulatory Visit

## 2024-07-16 ENCOUNTER — Encounter: Payer: Self-pay | Admitting: General Practice

## 2024-07-16 NOTE — Telephone Encounter (Signed)
 Jessica Simpson,  I need to better understand the patient requests.  Why is she asking for an oral antibiotic?  Has her breast changed from what was pictured in the chart from her visit?  The odor can be somewhat managed with topical flagyl , but a pill that is swallowed is not as helpful for a breast tumor.  Is there more redness, any warmth, fever or chills?  Does the patient have a way to check her blood pressure and heart rate at home?  Does she need to come in for IV fluids?  I am happy to accommodate that if needed.    I also put in the last LOS in the chart how her appointments should be adjusted.  Has scheduling adjusted these, or was there something off with that request?    Let me know what you find out and I am happy to address.  Thanks, Jessica Simpson

## 2024-07-16 NOTE — Progress Notes (Signed)
 [facility] Spiritual Care Note  Phoned to follow up on brief infusion visit. Ms Tullis describes her feelings as all over the place with her updated diagnosis. Normalized this very human reaction as part of processing and ensured again that she has chaplain's direct number. We plan to follow up in infusion (earlier in treatment for wakefulness), as well, to visit in more detail.  [address] Olam Corrigan, [place], [facility] Pager [phone] Voicemail [phone]

## 2024-07-16 NOTE — Telephone Encounter (Signed)
 Patient was last seen by Morna Kendall, NP on 07/13/2024.  Topical Flagyl  was prescribed. Patient is asking for oral antibiotic for the breast instead of the topical.  Also reports dizziness upon getting up from bed which resulted in a fall.  We discussed her oral intake as her last vitals were indicative of possible dehydration.  She will increase fluid intake.    She also is asking for her schedule to get back on the following schedule due to transportation needs.  Day 1 07/24/2024, Day 8 07/31/2024 and Day 21 08/14/2024.  Please advise if this is approved as this is not what was requested at last visit by provider.

## 2024-07-17 ENCOUNTER — Other Ambulatory Visit: Payer: Self-pay | Admitting: Pharmacist

## 2024-07-17 ENCOUNTER — Encounter: Payer: Self-pay | Admitting: Hematology and Oncology

## 2024-07-17 ENCOUNTER — Telehealth: Payer: Self-pay | Admitting: *Deleted

## 2024-07-17 DIAGNOSIS — C50919 Malignant neoplasm of unspecified site of unspecified female breast: Secondary | ICD-10-CM

## 2024-07-17 DIAGNOSIS — N6331 Unspecified lump in axillary tail of the right breast: Secondary | ICD-10-CM

## 2024-07-17 NOTE — Telephone Encounter (Signed)
 RN placed call to pt to f/u on conversation with nurse yesterday. Pt states she is feeling much better today and is not experiencing as much lightheadedness.  Per NP, pt to increase fluid intake and to monitor BP twice a day and report to our office if systolic is below 110 and diastolic is below 60. NP also states that an oral antibiotic is not needed at this time for breast drainage and that the topical cream would work best.  If symptoms become worse, pt educated to contact our office. Pt verbalized understanding.

## 2024-07-18 NOTE — Progress Notes (Signed)
 Remote PPM Transmission

## 2024-07-24 ENCOUNTER — Inpatient Hospital Stay: Admitting: Adult Health

## 2024-07-24 ENCOUNTER — Inpatient Hospital Stay

## 2024-07-25 ENCOUNTER — Telehealth: Payer: Self-pay | Admitting: *Deleted

## 2024-07-25 NOTE — Telephone Encounter (Signed)
 Received call from pt stating breast wound is getting worse and the topical flagyl  has not help.  Pt states she will be in town tomorrow for a PET scan and would like a [facility] visit to assess.  Appt scheduled, pt verbalized understanding of appt details.

## 2024-07-26 ENCOUNTER — Encounter: Payer: Self-pay | Admitting: Adult Health

## 2024-07-26 ENCOUNTER — Ambulatory Visit (HOSPITAL_COMMUNITY)
Admission: RE | Admit: 2024-07-26 | Discharge: 2024-07-26 | Disposition: A | Discharge status: Home / Self Care | Source: Ambulatory Visit | Attending: Adult Health | Admitting: Adult Health

## 2024-07-26 ENCOUNTER — Inpatient Hospital Stay: Attending: Hematology and Oncology | Admitting: Adult Health

## 2024-07-26 ENCOUNTER — Other Ambulatory Visit: Payer: Self-pay

## 2024-07-26 VITALS — BP 133/76 | HR 113 | Temp 98.4°F | Resp 19 | Ht 66.0 in | Wt 204.6 lb

## 2024-07-26 DIAGNOSIS — Z79633 Long term (current) use of mitotic inhibitor: Secondary | ICD-10-CM | POA: Insufficient documentation

## 2024-07-26 DIAGNOSIS — R7401 Elevation of levels of liver transaminase levels: Secondary | ICD-10-CM | POA: Insufficient documentation

## 2024-07-26 DIAGNOSIS — D6481 Anemia due to antineoplastic chemotherapy: Secondary | ICD-10-CM | POA: Diagnosis not present

## 2024-07-26 DIAGNOSIS — C771 Secondary and unspecified malignant neoplasm of intrathoracic lymph nodes: Secondary | ICD-10-CM | POA: Insufficient documentation

## 2024-07-26 DIAGNOSIS — C50012 Malignant neoplasm of nipple and areola, left female breast: Secondary | ICD-10-CM | POA: Insufficient documentation

## 2024-07-26 DIAGNOSIS — R17 Unspecified jaundice: Secondary | ICD-10-CM | POA: Diagnosis not present

## 2024-07-26 DIAGNOSIS — E66811 Obesity, class 1: Secondary | ICD-10-CM | POA: Insufficient documentation

## 2024-07-26 DIAGNOSIS — K8020 Calculus of gallbladder without cholecystitis without obstruction: Secondary | ICD-10-CM | POA: Diagnosis not present

## 2024-07-26 DIAGNOSIS — G4733 Obstructive sleep apnea (adult) (pediatric): Secondary | ICD-10-CM | POA: Insufficient documentation

## 2024-07-26 DIAGNOSIS — E876 Hypokalemia: Secondary | ICD-10-CM | POA: Diagnosis not present

## 2024-07-26 DIAGNOSIS — Z79899 Other long term (current) drug therapy: Secondary | ICD-10-CM | POA: Diagnosis not present

## 2024-07-26 DIAGNOSIS — C50919 Malignant neoplasm of unspecified site of unspecified female breast: Secondary | ICD-10-CM | POA: Diagnosis present

## 2024-07-26 DIAGNOSIS — Z6832 Body mass index (BMI) 32.0-32.9, adult: Secondary | ICD-10-CM | POA: Insufficient documentation

## 2024-07-26 DIAGNOSIS — D61818 Other pancytopenia: Secondary | ICD-10-CM | POA: Diagnosis not present

## 2024-07-26 DIAGNOSIS — N6331 Unspecified lump in axillary tail of the right breast: Secondary | ICD-10-CM | POA: Insufficient documentation

## 2024-07-26 DIAGNOSIS — Z452 Encounter for adjustment and management of vascular access device: Secondary | ICD-10-CM | POA: Diagnosis not present

## 2024-07-26 DIAGNOSIS — J90 Pleural effusion, not elsewhere classified: Secondary | ICD-10-CM | POA: Insufficient documentation

## 2024-07-26 DIAGNOSIS — Z171 Estrogen receptor negative status [ER-]: Secondary | ICD-10-CM | POA: Diagnosis not present

## 2024-07-26 DIAGNOSIS — Z5111 Encounter for antineoplastic chemotherapy: Secondary | ICD-10-CM | POA: Diagnosis not present

## 2024-07-26 DIAGNOSIS — D72819 Decreased white blood cell count, unspecified: Secondary | ICD-10-CM | POA: Insufficient documentation

## 2024-07-26 DIAGNOSIS — C50611 Malignant neoplasm of axillary tail of right female breast: Secondary | ICD-10-CM | POA: Diagnosis not present

## 2024-07-26 DIAGNOSIS — D649 Anemia, unspecified: Secondary | ICD-10-CM | POA: Insufficient documentation

## 2024-07-26 DIAGNOSIS — F419 Anxiety disorder, unspecified: Secondary | ICD-10-CM | POA: Insufficient documentation

## 2024-07-26 LAB — GLUCOSE, CAPILLARY: Glucose-Capillary: 96 mg/dL (ref 70–99)

## 2024-07-26 MED ORDER — OXYCODONE-ACETAMINOPHEN 5-325 MG PO TABS: 1.0000 | ORAL_TABLET | ORAL | 0 refills | Status: DC | PRN

## 2024-07-26 MED ORDER — SULFAMETHOXAZOLE-TRIMETHOPRIM 800-160 MG PO TABS
1.0000 | ORAL_TABLET | ORAL | 0 refills | Status: DC
Start: 2024-07-27 — End: 2024-08-27

## 2024-07-26 MED ORDER — FLUDEOXYGLUCOSE F - 18 (FDG) INJECTION
10.1000 | Freq: Once | INTRAVENOUS | Status: AC
  Administered 2024-07-26: 10.1 via INTRAVENOUS

## 2024-07-26 NOTE — Progress Notes (Unsigned)
 [facility] Cancer Follow up:    Jessica Clem Pitts, MD [address] 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 KI-67 of 99%    - Pathologic: No stage assigned - Unsigned Specimen type: Core Needle Biopsy Histopathologic type: Infiltrating duct carcinoma, NOS Tumor size (mm): 66 Prognostic indicators: Triple negative. 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)  06/30/2011 Initial Diagnosis   Breast cancer, IDC, Left, Stage III, Triple negative, 7.6 cm breast mass and palpable axillary mass   09/06/2011 Miscellaneous   BRCA 1 and 2: Negative   09/14/2011 - 11/23/2011 Neo-Adjuvant Chemotherapy   Dose dense FEC followed by dose dense Taxotere    12/21/2011 Surgery   Left lumpectomy: High-grade poorly differentiated IDC 1.7 cm with high-grade DCIS, margins negative, 3/7 lymph nodes positive, ER 0%, PR 0%, HER-2 negative ratio 1.33 T1CN1 stage IIb   12/31/2011 - 02/15/2012 Radiation Therapy   [facility]   09/26/2017 Relapse/Recurrence   Left breast upper outer quadrant  within the lumpectomy bed: Fibrosis no malignancy, right axillary lymph node biopsy metastatic high-grade carcinoma ER 0%, PR 0%, Ki-67 90%, HER-2 negative ratio 1.11 (similar to previous ductal carcinoma)   11/14/2017 - 03/06/2018 Neo-Adjuvant Chemotherapy   Neo-Adjuvant chemotherapy with Gemzar  and carboplatin  days 1 and 8 every 3 weeks   01/10/2018 Genetic Testing   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  The Hereditary Gene Panel offered by [facility] includes sequencing and/or deletion duplication testing of the following 47 genes: APC, ATM, AXIN2, BARD1, BMPR1A, BRCA1, BRCA2, BRIP1, CDH1, CDK4, CDKN2A (p14ARF), CDKN2A (p16INK4a), CHEK2, CTNNA1, DICER1, EPCAM (Deletion/duplication testing only), GREM1 (promoter region deletion/duplication testing only), KIT, MEN1, MLH1, MSH2, MSH3, MSH6, MUTYH, NBN, NF1, NHTL1, PALB2, PDGFRA, PMS2, POLD1, POLE, PTEN, RAD50, RAD51C, RAD51D, SDHB, SDHC, SDHD, SMAD4, SMARCA4. STK11, TP53, TSC1, TSC2, and VHL.  The following genes were evaluated for sequence changes only: SDHA and HOXB13 c.251G>A variant only. The report date is January 10, 2018.    05/31/2023 Procedure   Mediastinal mass biopsy: Metastatic poorly differentiated carcinoma compatible with breast primary ER +60%, PR 0%, HER2 0, Ki-67 40%    06/02/2023 PET scan   PET/CT Anterior mediastinal mass is hypermetabolic and centrally necrotic, soft tissue lesions anterior to the heart, 10 mm pleural-based lesion, level 2 cervical nodes, hypermetabolism hepatic dome mottled FDG accumulation throughout the skeletal system    06/06/2023 - 07/14/2023 Anti-estrogen oral therapy   Antiestrogen therapy with Verzenio  and letrozole     07/07/2023 Relapse/Recurrence   Increasing mediastinal mass 11.6 cm (used to be 10.8 cm) effacing the heart and involving the sternum, additional mediastinal/pericardial lymph nodes increased right  extra lymph node 1.5 cm, nodules 0.2 cm (used to be 0.8  cm), few new scattered lung nodules)   07/25/2023 -  Chemotherapy   Patient is on Treatment Plan : BREAST METASTATIC Sacituzumab govitecan -hziy (Trodelvy ) D1,8 q21d     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)  04/06/2018 Surgery   Right axillary lymph node dissection: 1/11 node positive for metastatic high-grade carcinoma   04/25/2018 Initial Diagnosis   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  History of Present Illness Jessica Simpson is a 53 year old female with metastatic breast cancer who presents with a worsening breast wound.  She is undergoing treatment with Trodelvy  and Keytruda . The breast wound is characterized by a tumor growing out of the breast, with increased leakage and an expanding exposed area. The skin is painful, with worsening pain exacerbated by falls.  She experiences dizziness and a sensation of heaviness, leading to a fall at home where she hit her face, resulting in swelling and a busted lip. She did not lose consciousness.  Her appetite is variable, affected by a recent cold. Significant pain from the breast wound affects her sleep. She has been prescribed Percocet in the past and tolerates it well. She is not currently taking any pain medication but expresses a need for it due to the severity of her pain. She is concerned about the stigma surrounding pain medication but clarifies that she only takes it when in pain.  No bone pain despite  significant disease burden in the bones.    Patient Active Problem List   Diagnosis Date Noted  . OSA on CPAP 11/15/2023  . Lyme disease 11/15/2023  . High degree atrioventricular block 11/15/2023  . Secondary malignancy of mediastinal lymph nodes (HCC) 07/20/2023  . PMB (postmenopausal bleeding) 01/26/2022  . Papanicolaou smear of cervix with positive high risk human papilloma virus (HPV) test 12/25/2020  . Routine medical exam 12/19/2020  . Encounter for screening fecal occult blood testing 12/19/2020  . Hypertension 09/01/2018  . Screening for colorectal cancer 09/01/2018  . Encounter for gynecological examination with Papanicolaou smear of cervix 09/01/2018  . 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 Genetic testing 01/12/2018  . Family history of breast cancer   . Port-A-Cath in place 11/21/2017  . Mass of axillary tail of right breast 08/24/2017  . Pain with urination 08/24/2017  . Urinary frequency 08/24/2017  . Hematuria 08/24/2017  . Encounter for well woman exam with routine gynecological exam 01/15/2016  . Benign cyst of right breast 11/29/2015  . Positive fecal occult blood test 07/22/2014  . History of breast cancer 07/22/2014  . Rectal bleeding 07/10/2014  . Lymphedema 06/15/2012  . BRCA1 negative 10/22/2011  . BRCA2 negative 10/22/2011  . 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  MEDICAL HISTORY: Past Medical History:  Diagnosis Date  .  BRCA1 negative 10/22/2011  . BRCA2 negative 10/22/2011  . Breast cancer, IDC, Left, Stage III, Triple negative 06/30/2011  . Breast disorder   . Contraceptive education 01/15/2016  . Cough   . Dyspnea   . Family history of breast cancer   . Fracture    right 4th toe  . History of breast cancer 07/22/2014  . History of radiation therapy 05/16/18- 06/22/18   Right Breast/ 50.4 Gy in 28  fractions. Right posterior axilla and SCV nodes/ 50.4 Gy in 28 fractions.   . History of radiation therapy    04/16/24 - 04/30/24 Dr. Lauraine Golden (Breast)  . Hypertension   . Lymphedema 06/15/2012  . Lymphedema of arm   . Papanicolaou smear of cervix with positive high risk human papilloma virus (HPV) test 12/25/2020   12/19/2020 repeat pap in 1 year per ASCCP guidelines, 5 year CIN3+risk is 2.25%  . Personal history of chemotherapy   . Personal history of radiation therapy   . Positive fecal occult blood test 07/22/2014  . Presence of permanent cardiac pacemaker   . S/P radiation therapy 2013   50 gray in 25 fractions to the left breast, supraclavicular, and axillary regions. She then received a boost to the left lumpectomy of 10 gray in 5 fractions. This was given at [facility] .  . Sleep apnea   . Status post chemotherapy Comp. 11/23/11   FEC and Taxotere     SURGICAL HISTORY: Past Surgical History:  Procedure Laterality Date  . 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 AXILLARY LYMPH NODE BIOPSY Right 04/06/2018  . AXILLARY LYMPH NODE DISSECTION Right 04/06/2018   Procedure: RIGHT AXILLARY LYMPH NODE DISSECTION;  Surgeon: Ebbie Cough, MD;  Location: [facility] OR;  Service: General;  Laterality: Right;  . BREAST BIOPSY Left 2012  . BREAST BIOPSY Right 2019  . BREAST LUMPECTOMY Left 2012  . BREAST LUMPECTOMY Right 2019   rt axilla  . BREAST SURGERY    . CARDIAC ELECTROPHYSIOLOGY MAPPING AND ABLATION    . CESAREAN SECTION    . COLONOSCOPY N/A 08/14/2014   Procedure: COLONOSCOPY;  Surgeon: Claudis RAYMOND Rivet, MD;  Location: [facility];  Service: Endoscopy;  Laterality: N/A;  930  . EVACUATION BREAST HEMATOMA  12/21/2011   Procedure: EVACUATION HEMATOMA BREAST;  Surgeon: Jina Nephew, MD;  Location: [facility] OR;  Service: General;  Laterality: Left;  . HAND SURGERY     tumor on finger, left hand  . INCISION AND DRAINAGE ABSCESS  ANAL     x3  . IR IMAGING GUIDED PORT INSERTION  08/01/2023  . left breast biopsy with axillary biopsy     core bx  . PORT-A-CATH REMOVAL  12/21/2011   Procedure: REMOVAL PORT-A-CATH;  Surgeon: Sherlean JINNY Laughter, MD;  Location: [facility];  Service: General;  Laterality: N/A;  . PORTACATH PLACEMENT  08/02/2011   dr Laughter  . PORTACATH PLACEMENT Right 11/14/2017   Procedure: INSERTION PORT-A-CATH WITH US ;  Surgeon: Ebbie Cough, MD;  Location: [facility] OR;  Service: General;  Laterality: Right;  . removal breast mass  2004   benign  . TREATMENT FISTULA ANAL    . TUMOR REMOVAL     on left hand  . TUMOR REMOVAL     rt. eye    SOCIAL HISTORY: Social History   Socioeconomic History  . Marital status: Single    Spouse name: Not on file  . Number of children:  Not on file  . Years of education: Not on file  . Highest education level: Not on file  Occupational History  . Not on file  Tobacco Use  . Smoking status: Never  . Smokeless tobacco: Never  Vaping Use  . Vaping status: Never Used  Substance and Sexual Activity  . Alcohol use: No  . Drug use: No  . Sexual activity: Yes    Birth control/protection: Post-menopausal  Other Topics Concern  . Not on file  Social History Narrative  . Not on file   Social Drivers of Health   Financial Resource Strain: Low Risk  (01/26/2022)   Overall Financial Resource Strain (CARDIA)   . Difficulty of Paying Living Expenses: Not very hard  Food Insecurity: No Food Insecurity (04/04/2024)   Hunger Vital Sign   . Worried About Programme researcher, broadcasting/film/video in the Last Year: Never true   . Ran Out of Food in the Last Year: Never true  Transportation Needs: No Transportation Needs (04/04/2024)   PRAPARE - Transportation   . Lack of Transportation (Medical): No   . Lack of Transportation (Non-Medical): No  Physical Activity: Insufficiently Active (01/26/2022)   Exercise Vital Sign   . Days of Exercise per Week: 3 days   . Minutes of  Exercise per Session: 30 min  Stress: No Stress Concern Present (01/26/2022)   [employer] of Occupational Health - Occupational Stress Questionnaire   . Feeling of Stress : Only a little  Social Connections: Moderately Integrated (01/26/2022)   Social Connection and Isolation Panel   . Frequency of Communication with Friends and Family: More than three times a week   . Frequency of Social Gatherings with Friends and Family: Once a week   . Attends Religious Services: More than 4 times per year   . Active Member of Clubs or Organizations: Yes   . Attends Banker Meetings: More than 4 times per year   . Marital Status: Never married  Intimate Partner Violence: Not At Risk (04/04/2024)   Humiliation, Afraid, Rape, and Kick questionnaire   . Fear of Current or Ex-Partner: No   . Emotionally Abused: No   . Physically Abused: No   . Sexually Abused: No    FAMILY HISTORY: Family History  Problem Relation Age of Onset  . Cancer Father 40       lung cancer   . Cancer Maternal Grandmother        breast  . Cancer Maternal Grandfather   . Breast cancer Other        MGMs mother  . Colon cancer Neg Hx     Review of Systems  Constitutional:  Positive for fatigue. Negative for appetite change, chills, fever and unexpected weight change.  HENT:   Negative for hearing loss, lump/mass and trouble swallowing.   Eyes:  Negative for eye problems and icterus.  Respiratory:  Negative for chest tightness, cough and shortness of breath.   Cardiovascular:  Negative for chest pain, leg swelling and palpitations.  Gastrointestinal:  Negative for abdominal distention, abdominal pain, constipation, diarrhea, nausea and vomiting.  Endocrine: Negative for hot flashes.  Genitourinary:  Negative for difficulty urinating.   Musculoskeletal:  Negative for arthralgias.  Skin:  Negative for itching and rash.  Neurological:  Positive for dizziness. Negative for extremity weakness, headaches and  numbness.  Hematological:  Negative for adenopathy. Does not bruise/bleed easily.  Psychiatric/Behavioral:  Negative for depression. The patient is not nervous/anxious.  PHYSICAL EXAMINATION    Vitals:   07/26/24 1312  BP: 133/76  Pulse: (!) 113  Resp: 19  Temp: 98.4 F (36.9 C)  SpO2: 100%    Physical Exam Constitutional:      General: She is not in acute distress.    Appearance: Normal appearance. She is not toxic-appearing.  HENT:     Head: Normocephalic and atraumatic.     Mouth/Throat:     Mouth: Mucous membranes are moist.     Pharynx: Oropharynx is clear. No oropharyngeal exudate or posterior oropharyngeal erythema.  Eyes:     General: No scleral icterus. Cardiovascular:     Rate and Rhythm: Normal rate and regular rhythm.     Pulses: Normal pulses.     Heart sounds: Normal heart sounds.  Pulmonary:     Effort: Pulmonary effort is normal.     Breath sounds: Normal breath sounds.  Abdominal:     General: Abdomen is flat. Bowel sounds are normal. There is no distension.     Palpations: Abdomen is soft.     Tenderness: There is no abdominal tenderness.  Musculoskeletal:        General: No swelling.     Cervical back: Neck supple.  Lymphadenopathy:     Cervical: No cervical adenopathy.  Skin:    General: Skin is warm and dry.     Findings: No rash.  Neurological:     General: No focal deficit present.     Mental Status: She is alert.  Psychiatric:        Mood and Affect: Mood normal.        Behavior: Behavior normal.   Breast / / LABORATORY DATA:  CBC    Component Value Date/Time   WBC 1.6 (L) 07/13/2024 1128   WBC 5.1 05/31/2023 0831   RBC 2.57 (L) 07/13/2024 1128   HGB 8.0 (L) 07/13/2024 1128   HGB 12.2 07/26/2016 1650   HCT 23.9 (L) 07/13/2024 1128   HCT 37.5 07/26/2016 1650   PLT 79 (L) 07/13/2024 1128   PLT 294 07/26/2016 1650   MCV 93.0 07/13/2024 1128   MCV 80 07/26/2016 1650   MCH 31.1 07/13/2024 1128   MCHC 33.5 07/13/2024  1128   RDW 16.8 (H) 07/13/2024 1128   RDW 14.0 07/26/2016 1650   LYMPHSABS 0.2 (L) 07/13/2024 1128   MONOABS 0.2 07/13/2024 1128   EOSABS 0.0 07/13/2024 1128   BASOSABS 0.0 07/13/2024 1128    CMP     Component Value Date/Time   NA 137 07/13/2024 1128   NA 140 07/26/2016 1650   K 3.7 07/13/2024 1128   CL 102 07/13/2024 1128   CO2 27 07/13/2024 1128   GLUCOSE 88 07/13/2024 1128   BUN 16 07/13/2024 1128   BUN 13 07/26/2016 1650   CREATININE 0.74 07/13/2024 1128   CALCIUM 9.1 07/13/2024 1128   PROT 7.2 07/13/2024 1128   PROT 7.4 07/26/2016 1650   ALBUMIN 3.8 07/13/2024 1128   ALBUMIN 4.2 07/26/2016 1650   AST 16 07/13/2024 1128   ALT 12 07/13/2024 1128   ALKPHOS 90 07/13/2024 1128   BILITOT 0.4 07/13/2024 1128   GFRNONAA >60 07/13/2024 1128   GFRAA >60 12/21/2018 1402   GFRAA >60 10/24/2018 1053     ASSESSMENT and THERAPY PLAN:   No problem-specific Assessment & Plan notes found for this encounter.     All questions were answered. The patient knows to call the clinic with any problems, questions or concerns. We can  certainly see the patient much sooner if necessary.  Total encounter time:*** minutes*in face-to-face visit time, chart review, lab review, care coordination, order entry, and documentation of the encounter time.    Morna Kendall, NP 07/26/24 1:32 PM Medical Oncology and Hematology [facility] [address] [place], [place] [address] Tel. [phone]    Fax. [phone]  *Total Encounter Time as defined by the Centers for Medicare and Medicaid Services includes, in addition to the face-to-face time of a patient visit (documented in the note above) non-face-to-face time: obtaining and reviewing outside history, ordering and reviewing medications, tests or procedures, care coordination (communications with other health care professionals or caregivers) and documentation in the medical record.

## 2024-07-27 ENCOUNTER — Inpatient Hospital Stay

## 2024-07-27 ENCOUNTER — Inpatient Hospital Stay: Admitting: Adult Health

## 2024-07-30 ENCOUNTER — Ambulatory Visit

## 2024-07-30 MED FILL — Fosaprepitant Dimeglumine For IV Infusion 150 MG (Base Eq): INTRAVENOUS | Qty: 5 | Status: AC

## 2024-07-31 ENCOUNTER — Encounter: Payer: Self-pay | Admitting: Hematology and Oncology

## 2024-07-31 ENCOUNTER — Encounter (HOSPITAL_COMMUNITY): Payer: Self-pay

## 2024-07-31 ENCOUNTER — Telehealth: Payer: Self-pay | Admitting: Medical Oncology

## 2024-07-31 ENCOUNTER — Other Ambulatory Visit

## 2024-07-31 ENCOUNTER — Other Ambulatory Visit: Payer: Self-pay

## 2024-07-31 ENCOUNTER — Ambulatory Visit

## 2024-07-31 ENCOUNTER — Inpatient Hospital Stay: Admitting: Adult Health

## 2024-07-31 ENCOUNTER — Encounter: Payer: Self-pay | Admitting: Adult Health

## 2024-07-31 ENCOUNTER — Inpatient Hospital Stay

## 2024-07-31 ENCOUNTER — Other Ambulatory Visit: Payer: Self-pay | Admitting: *Deleted

## 2024-07-31 ENCOUNTER — Ambulatory Visit (HOSPITAL_COMMUNITY)
Admission: RE | Admit: 2024-07-31 | Discharge: 2024-07-31 | Disposition: A | Discharge status: Home / Self Care | Source: Ambulatory Visit | Attending: Adult Health | Admitting: Adult Health

## 2024-07-31 ENCOUNTER — Other Ambulatory Visit (HOSPITAL_COMMUNITY): Payer: Self-pay

## 2024-07-31 VITALS — BP 115/68 | HR 113 | Temp 98.3°F | Resp 19 | Ht 66.0 in | Wt 203.9 lb

## 2024-07-31 DIAGNOSIS — C50919 Malignant neoplasm of unspecified site of unspecified female breast: Secondary | ICD-10-CM

## 2024-07-31 DIAGNOSIS — C50012 Malignant neoplasm of nipple and areola, left female breast: Secondary | ICD-10-CM

## 2024-07-31 DIAGNOSIS — N6331 Unspecified lump in axillary tail of the right breast: Secondary | ICD-10-CM

## 2024-07-31 DIAGNOSIS — C771 Secondary and unspecified malignant neoplasm of intrathoracic lymph nodes: Secondary | ICD-10-CM

## 2024-07-31 DIAGNOSIS — Z5111 Encounter for antineoplastic chemotherapy: Secondary | ICD-10-CM | POA: Diagnosis not present

## 2024-07-31 LAB — CBC WITH DIFFERENTIAL (CANCER CENTER ONLY)
Abs Immature Granulocytes: 0.28 K/uL — ABNORMAL HIGH (ref 0.00–0.07)
Basophils Absolute: 0 K/uL (ref 0.0–0.1)
Basophils Relative: 1 %
Eosinophils Absolute: 0 K/uL (ref 0.0–0.5)
Eosinophils Relative: 0 %
HCT: 23.8 % — ABNORMAL LOW (ref 36.0–46.0)
Hemoglobin: 7.8 g/dL — ABNORMAL LOW (ref 12.0–15.0)
Immature Granulocytes: 13 %
Lymphocytes Relative: 10 %
Lymphs Abs: 0.2 K/uL — ABNORMAL LOW (ref 0.7–4.0)
MCH: 31.2 pg (ref 26.0–34.0)
MCHC: 32.8 g/dL (ref 30.0–36.0)
MCV: 95.2 fL (ref 80.0–100.0)
Monocytes Absolute: 0.3 K/uL (ref 0.1–1.0)
Monocytes Relative: 13 %
Neutro Abs: 1.4 K/uL — ABNORMAL LOW (ref 1.7–7.7)
Neutrophils Relative %: 63 %
Platelet Count: 116 K/uL — ABNORMAL LOW (ref 150–400)
RBC: 2.5 MIL/uL — ABNORMAL LOW (ref 3.87–5.11)
RDW: 21 % — ABNORMAL HIGH (ref 11.5–15.5)
WBC Count: 2.2 K/uL — ABNORMAL LOW (ref 4.0–10.5)
nRBC: 1.4 % — ABNORMAL HIGH (ref 0.0–0.2)

## 2024-07-31 LAB — CMP (CANCER CENTER ONLY)
ALT: 226 U/L — ABNORMAL HIGH (ref 0–44)
AST: 92 U/L — ABNORMAL HIGH (ref 15–41)
Albumin: 3.6 g/dL (ref 3.5–5.0)
Alkaline Phosphatase: 746 U/L — ABNORMAL HIGH (ref 38–126)
Anion gap: 8 (ref 5–15)
BUN: 13 mg/dL (ref 6–20)
CO2: 26 mmol/L (ref 22–32)
Calcium: 9.3 mg/dL (ref 8.9–10.3)
Chloride: 102 mmol/L (ref 98–111)
Creatinine: 0.87 mg/dL (ref 0.44–1.00)
GFR, Estimated: >60 mL/min (ref >60)
Glucose, Bld: 116 mg/dL — ABNORMAL HIGH (ref 70–99)
Potassium: 3.2 mmol/L — ABNORMAL LOW (ref 3.5–5.1)
Sodium: 136 mmol/L (ref 135–145)
Total Bilirubin: 4.7 mg/dL (ref 0.0–1.2)
Total Protein: 6.9 g/dL (ref 6.5–8.1)

## 2024-07-31 LAB — BILIRUBIN, FRACTIONATED(TOT/DIR/INDIR)
Bilirubin, Direct: 3.3 mg/dL — ABNORMAL HIGH (ref 0.0–0.2)
Indirect Bilirubin: 0.8 mg/dL (ref 0.3–0.9)
Total Bilirubin: 4.1 mg/dL — ABNORMAL HIGH (ref 0.0–1.2)

## 2024-07-31 LAB — SAMPLE TO BLOOD BANK

## 2024-07-31 LAB — PHOSPHORUS: Phosphorus: 3.5 mg/dL (ref 2.5–4.6)

## 2024-07-31 LAB — MAGNESIUM: Magnesium: 1.7 mg/dL (ref 1.7–2.4)

## 2024-07-31 MED ORDER — OXYCODONE HCL 5 MG PO TABS: 5.0000 mg | ORAL_TABLET | ORAL | 0 refills | Status: DC | PRN

## 2024-07-31 MED ORDER — OXYCODONE HCL 5 MG PO TABS
5.0000 mg | ORAL_TABLET | ORAL | 0 refills | Status: DC | PRN
  Filled 2024-07-31: qty 30, 5d supply, fill #0

## 2024-07-31 NOTE — Telephone Encounter (Signed)
 CRITICAL VALUE STICKER  CRITICAL VALUE:   T bili fractionated=4.1  RECEIVER (on-site recipient of call):Byrdie Miyazaki, RN  DATE & TIME NOTIFIED:  07/31/2024@ 1152  MESSENGER (representative from lab):Jessica  MD NOTIFIED: Causey/Gudena  TIME OF NOTIFICATION: 1158  RESPONSE:  seen by Manuelita and MR abdomen ordered.

## 2024-07-31 NOTE — Progress Notes (Signed)
 [facility] Cancer Follow up:    [name], MD [address] 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 KI-67 of 99%    - Pathologic: No stage assigned - Unsigned Specimen type: Core Needle Biopsy Histopathologic type: Infiltrating duct carcinoma, NOS Tumor size (mm): 66 Prognostic indicators: Triple negative. 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)  06/30/2011 Initial Diagnosis   Breast cancer, IDC, Left, Stage III, Triple negative, 7.6 cm breast mass and palpable axillary mass   09/06/2011 Miscellaneous   BRCA 1 and 2: Negative   09/14/2011 - 11/23/2011 Neo-Adjuvant Chemotherapy   Dose dense FEC followed by dose dense Taxotere    12/21/2011 Surgery   Left lumpectomy: High-grade poorly differentiated IDC 1.7 cm with high-grade DCIS, margins negative, 3/7 lymph nodes positive, ER 0%, PR 0%, HER-2 negative ratio 1.33 T1CN1 stage IIb   12/31/2011 - 02/15/2012 Radiation Therapy   Radiation at [facility]   09/26/2017 Relapse/Recurrence   Left breast upper outer quadrant  within the lumpectomy bed: Fibrosis no malignancy, right axillary lymph node biopsy metastatic high-grade carcinoma ER 0%, PR 0%, Ki-67 90%, HER-2 negative ratio 1.11 (similar to previous ductal carcinoma)   11/14/2017 - 03/06/2018 Neo-Adjuvant Chemotherapy   Neo-Adjuvant chemotherapy with Gemzar  and carboplatin  days 1 and 8 every 3 weeks   01/10/2018 Genetic Testing   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  The Hereditary Gene Panel offered by [facility] includes sequencing and/or deletion duplication testing of the following 47 genes: APC, ATM, AXIN2, BARD1, BMPR1A, BRCA1, BRCA2, BRIP1, CDH1, CDK4, CDKN2A (p14ARF), CDKN2A (p16INK4a), CHEK2, CTNNA1, DICER1, EPCAM (Deletion/duplication testing only), GREM1 (promoter region deletion/duplication testing only), KIT, MEN1, MLH1, MSH2, MSH3, MSH6, MUTYH, NBN, NF1, NHTL1, PALB2, PDGFRA, PMS2, POLD1, POLE, PTEN, RAD50, RAD51C, RAD51D, SDHB, SDHC, SDHD, SMAD4, SMARCA4. STK11, TP53, TSC1, TSC2, and VHL.  The following genes were evaluated for sequence changes only: SDHA and HOXB13 c.251G>A variant only. The report date is January 10, 2018.    05/31/2023 Procedure   Mediastinal mass biopsy: Metastatic poorly differentiated carcinoma compatible with breast primary ER +60%, PR 0%, HER2 0, Ki-67 40%    06/02/2023 PET scan   PET/CT Anterior mediastinal mass is hypermetabolic and centrally necrotic, soft tissue lesions anterior to the heart, 10 mm pleural-based lesion, level 2 cervical nodes, hypermetabolism hepatic dome mottled FDG accumulation throughout the skeletal system    06/06/2023 - 07/14/2023 Anti-estrogen oral therapy   Antiestrogen therapy with Verzenio  and letrozole     07/07/2023 Relapse/Recurrence   Increasing mediastinal mass 11.6 cm (used to be 10.8 cm) effacing the heart and involving the sternum, additional mediastinal/pericardial lymph nodes increased right  extra lymph node 1.5 cm, nodules 0.2 cm (used to be 0.8  cm), few new scattered lung nodules)   07/25/2023 -  Chemotherapy   Patient is on Treatment Plan : BREAST METASTATIC Sacituzumab govitecan -hziy (Trodelvy ) D1,8 q21d     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)  04/06/2018 Surgery   Right axillary lymph node dissection: 1/11 node positive for metastatic high-grade carcinoma   04/25/2018 Initial Diagnosis   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  History of Present Illness Jessica Simpson is a 53 year old female with metastatic breast cancer who presents for follow-up and evaluation after a PET scan that occurred on 07/26/2024.SABRA  The PET scan reveals continued hypermetabolic disease in bilateral breasts with extension into the skin, mediastinal adenopathy, and increased nodal activity. There is also hypermetabolic para-aortic lymphadenopathy, bilateral iliac and inguinal adenopathy with increased iliac lymphadenopathy and slight progression.  She experiences a burning sensation in the skin and pain that is not well managed by her current pain medication regimen. She takes three pain pills, but the effect is delayed, and she often feels sleepy. She has been using Tylenol  about 6 tablets a day without relief and started Percocet on Saturday. She inquires about using numbing cream for her port to alleviate the burning sensation on her skin.   Recent lab results show a hemoglobin level of 7.8, white blood cells  at 2.2, neutrophils at 1.4, platelets at 116, magnesium  at 1.7, potassium at 3.2, and elevated liver enzymes with a bilirubin level of 4.7.  She experienced nausea and vomiting on Saturday night, which she attributes to not sitting up long enough after eating. There is no consistent nausea or abdominal pain. Her main caregiver is leaving soon to attend a work conference.  She would like to attend with them next week and spend more time enjoying her life.    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   High degree atrioventricular block 11/15/2023   Secondary malignancy of mediastinal lymph nodes (HCC) 07/20/2023   PMB (postmenopausal bleeding) 01/26/2022   Papanicolaou smear of cervix with positive high risk human papilloma virus (HPV) test 12/25/2020   Routine medical exam 12/19/2020   Encounter for screening fecal occult blood testing 12/19/2020   Hypertension 09/01/2018   Screening for colorectal cancer 09/01/2018   Encounter for gynecological examination with Papanicolaou smear of cervix 09/01/2018   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) 04/25/2018   Genetic testing 01/12/2018   Family history of breast cancer    Port-A-Cath in place 11/21/2017   Mass of axillary tail of right breast 08/24/2017   Pain with urination 08/24/2017   Urinary frequency 08/24/2017   Hematuria 08/24/2017   Encounter for well woman exam with routine gynecological exam 01/15/2016   Benign cyst of right breast 11/29/2015   Positive fecal occult blood test 07/22/2014   History  of breast cancer 07/22/2014   Rectal bleeding 07/10/2014   Lymphedema 06/15/2012   BRCA1 negative 10/22/2011   BRCA2 negative 10/22/2011   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  MEDICAL HISTORY: Past Medical History:  Diagnosis Date   BRCA1  negative 10/22/2011   BRCA2 negative 10/22/2011   Breast cancer, IDC, Left, Stage III, Triple negative 06/30/2011   Breast disorder    Contraceptive education 01/15/2016   Cough    Dyspnea    Family history of breast cancer    Fracture    right 4th toe   History of breast cancer 07/22/2014   History of radiation therapy 05/16/18- 06/22/18   Right Breast/ 50.4 Gy in 28 fractions. Right posterior axilla and SCV nodes/ 50.4 Gy in 28 fractions.    History of radiation therapy    04/16/24 - 04/30/24 Dr. 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 She then received a boost to the left lumpectomy of 10 gray in 5 fractions. This was given at [facility] .   Sleep apnea    Status post chemotherapy Comp. 11/23/11   FEC and Taxotere     SURGICAL HISTORY: Past Surgical History:  Procedure Laterality Date   anal ascess     turned into a fistula with extensive treatment   AXILLARY LYMPH NODE BIOPSY Right 04/06/2018   AXILLARY LYMPH NODE DISSECTION Right 04/06/2018   Procedure: RIGHT AXILLARY LYMPH NODE DISSECTION;  Surgeon: Ebbie Cough, MD;  Location: [facility] OR;  Service: General;  Laterality: Right;   BREAST BIOPSY Left 2012   BREAST BIOPSY Right 2019   BREAST LUMPECTOMY Left 2012   BREAST LUMPECTOMY Right 2019   rt axilla   BREAST SURGERY     CARDIAC ELECTROPHYSIOLOGY MAPPING AND ABLATION     CESAREAN SECTION     COLONOSCOPY N/A 08/14/2014   Procedure:  COLONOSCOPY;  Surgeon: Claudis RAYMOND Rivet, MD;  Location: [facility];  Service: Endoscopy;  Laterality: N/A;  930   EVACUATION BREAST HEMATOMA  12/21/2011   Procedure: EVACUATION HEMATOMA BREAST;  Surgeon: Jina Nephew, MD;  Location: [facility] OR;  Service: General;  Laterality: Left;   HAND SURGERY     tumor on finger, left hand   INCISION AND DRAINAGE ABSCESS ANAL     x3   IR IMAGING GUIDED PORT INSERTION  08/01/2023   left breast biopsy with axillary biopsy     core bx   PORT-A-CATH REMOVAL  12/21/2011   Procedure: REMOVAL PORT-A-CATH;  Surgeon: Sherlean JINNY Laughter, MD;  Location: [facility];  Service: General;  Laterality: N/A;   PORTACATH PLACEMENT  08/02/2011   dr Laughter   PORTACATH PLACEMENT Right 11/14/2017   Procedure: INSERTION PORT-A-CATH WITH US ;  Surgeon: Ebbie Cough, MD;  Location: [facility] OR;  Service: General;  Laterality: Right;   removal breast mass  2004  benign   TREATMENT FISTULA ANAL     TUMOR REMOVAL     on left hand   TUMOR REMOVAL     rt. eye    SOCIAL HISTORY: Social History   Socioeconomic History   Marital status: Single    Spouse name: Not on file   Number of children: Not on file   Years of education: Not on file   Highest education level: Not on file  Occupational History   Not on file  Tobacco Use   Smoking status: Never   Smokeless tobacco: Never  Vaping Use   Vaping status: Never Used  Substance and Sexual Activity   Alcohol use: No   Drug use: No   Sexual activity: Yes    Birth control/protection: Post-menopausal  Other Topics Concern   Not on file  Social History Narrative   Not on file   Social Drivers of Health   Financial Resource Strain: Low Risk  (01/26/2022)   Overall Financial Resource Strain (CARDIA)    Difficulty of Paying Living Expenses: Not very hard  Food Insecurity: No Food Insecurity (04/04/2024)   Hunger Vital Sign    Worried About Running Out of Food in the Last Year: Never true    Ran Out of Food  in the Last Year: Never true  Transportation Needs: No Transportation Needs (04/04/2024)   PRAPARE - Administrator, Civil Service (Medical): No    Lack of Transportation (Non-Medical): No  Physical Activity: Insufficiently Active (01/26/2022)   Exercise Vital Sign    Days of Exercise per Week: 3 days    Minutes of Exercise per Session: 30 min  Stress: No Stress Concern Present (01/26/2022)   [employer] of Occupational Health - Occupational Stress Questionnaire    Feeling of Stress : Only a little  Social Connections: Moderately Integrated (01/26/2022)   Social Connection and Isolation Panel    Frequency of Communication with Friends and Family: More than three times a week    Frequency of Social Gatherings with Friends and Family: Once a week    Attends Religious Services: More than 4 times per year    Active Member of [employer] or Organizations: Yes    Attends Engineer, structural: More than 4 times per year    Marital Status: Never married  Intimate Partner Violence: Not At Risk (04/04/2024)   Humiliation, Afraid, Rape, and Kick questionnaire    Fear of Current or Ex-Partner: No    Emotionally Abused: No    Physically Abused: No    Sexually Abused: No    FAMILY HISTORY: Family History  Problem Relation Age of Onset   Cancer Father 29       lung cancer    Cancer Maternal Grandmother        breast   Cancer Maternal Grandfather    Breast cancer Other        MGMs mother   Colon cancer Neg Hx     Review of Systems  Constitutional:  Negative for appetite change, chills, fatigue, fever and unexpected weight change.  HENT:   Negative for hearing loss, lump/mass and trouble swallowing.   Eyes:  Negative for eye problems and icterus.  Respiratory:  Negative for chest tightness, cough and shortness of breath.   Cardiovascular:  Negative for chest pain, leg swelling and palpitations.  Gastrointestinal:  Negative for abdominal distention, abdominal pain,  constipation, diarrhea, nausea and vomiting.  Endocrine: Negative for hot flashes.  Genitourinary:  Negative for  difficulty urinating.   Musculoskeletal:  Negative for arthralgias.  Skin:  Negative for itching and rash.  Neurological:  Negative for dizziness, extremity weakness, headaches and numbness.  Hematological:  Negative for adenopathy. Does not bruise/bleed easily.  Psychiatric/Behavioral:  Negative for depression. The patient is not nervous/anxious.       PHYSICAL EXAMINATION    Vitals:   07/31/24 0949  BP: 115/68  Pulse: (!) 113  Resp: 19  Temp: 98.3 F (36.8 C)  SpO2: 100%    Physical Exam Constitutional:      General: She is not in acute distress.    Appearance: Normal appearance. She is not toxic-appearing.  HENT:     Head: Normocephalic and atraumatic.  Eyes:     General: No scleral icterus. Musculoskeletal:        General: No swelling.  Skin:    General: Skin is warm and dry.     Capillary Refill: Capillary refill takes less than 2 seconds.     Coloration: Skin is not jaundiced.     Findings: No rash.  Neurological:     General: No focal deficit present.     Mental Status: She is alert.     LABORATORY DATA:  CBC    Component Value Date/Time   WBC 2.2 (L) 07/31/2024 0916   WBC 5.1 05/31/2023 0831   RBC 2.50 (L) 07/31/2024 0916   HGB 7.8 (L) 07/31/2024 0916   HGB 12.2 07/26/2016 1650   HCT 23.8 (L) 07/31/2024 0916   HCT 37.5 07/26/2016 1650   PLT 116 (L) 07/31/2024 0916   PLT 294 07/26/2016 1650   MCV 95.2 07/31/2024 0916   MCV 80 07/26/2016 1650   MCH 31.2 07/31/2024 0916   MCHC 32.8 07/31/2024 0916   RDW 21.0 (H) 07/31/2024 0916   RDW 14.0 07/26/2016 1650   LYMPHSABS 0.2 (L) 07/31/2024 0916   MONOABS 0.3 07/31/2024 0916   EOSABS 0.0 07/31/2024 0916   BASOSABS 0.0 07/31/2024 0916    CMP     Component Value Date/Time   NA 136 07/31/2024 0916   NA 140 07/26/2016 1650   K 3.2 (L) 07/31/2024 0916   CL 102 07/31/2024 0916   CO2  26 07/31/2024 0916   GLUCOSE 116 (H) 07/31/2024 0916   BUN 13 07/31/2024 0916   BUN 13 07/26/2016 1650   CREATININE 0.87 07/31/2024 0916   CALCIUM 9.3 07/31/2024 0916   PROT 6.9 07/31/2024 0916   PROT 7.4 07/26/2016 1650   ALBUMIN 3.6 07/31/2024 0916   ALBUMIN 4.2 07/26/2016 1650   AST 92 (H) 07/31/2024 0916   ALT 226 (H) 07/31/2024 0916   ALKPHOS 746 (H) 07/31/2024 0916   BILITOT 4.7 (HH) 07/31/2024 0916   BILITOT 4.1 (H) 07/31/2024 0916   GFRNONAA >60 07/31/2024 0916   GFRAA >60 12/21/2018 1402   GFRAA >60 10/24/2018 1053     ASSESSMENT and THERAPY PLAN:   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) 06/30/2011: Left breast cancer stage III triple negative, 7.6 cm 09/14/2011: Dose dense FEC followed by dose dense Taxotere  neoadjuvant chemo 12/21/2011: Left lumpectomy: High-grade poorly differentiated IDC 1.7 cm 3/7 nodes positive triple negative, XRT 09/26/2017: Relapse: Right axillary lymph node biopsy positive for metastatic high-grade carcinoma triple negative 11/14/2017: Neoadjuvant Gemzar  and carboplatin  05/31/2023: Relapse: Mediastinal mass biopsy: Metastatic poorly differentiated carcinoma ER 60%, HER2 negative 06/06/2023: Verzinio with letrozole  07/25/2023: Trodelvy  02/21/2024: Chest wall skin biopsy: Poorly differentiated carcinoma ER 0% PR 0% Ki-67 40%, HER2 0 --------------------------------------------------------------------------------------------------------------------- Caris molecular  testing: PD-L1 mutation: Adding Keytruda  to Trodelvy  based on clinical trial ASCENT 04 (median PFS 11.2 months versus 7.8 months)  PET-CT: 05/16/2024: Increased metabolic activity throughout both breast suggestive of progressive breast malignancy.  Progressive metastatic nodal disease and soft tissue nodules.  Stable anterior mediastinal soft tissue lymph nodes.  Decreased sternal activity.  Enlarging right middle lobe 6 mm lung metastases.  Increase in peripancreatic,  retroperitoneal, para-aortic, aortocaval lymph nodes conglomerate measures 7.1 cm Current Treatment: Trodelvy  and Keytruda  (Keytruda  added on 05/22/2024)  Assessment & Plan Metastatic breast cancer with skin and lymph node involvement PET scan showed hypermetabolic disease in bilateral breasts, skin, mediastinal adenopathy, and groin lymph nodes. Current treatment ineffective. - Switch to eribulin and Keytruda  regimen after her return in approximately three weeks once elevated bilirubin issue below resolves - No treatment today  Elevated bilirubin and abnormal liver enzymes Bilirubin at 4.7 suggests hepatic obstruction. Elevated liver enzymes possibly due to excessive Tylenol . - Stop medications metabolized by the liver, including Tylenol  and Percocet. - Prescribe oxycodone  5mg  q6hprn - STAT MRCP ordered - Fractionate bilirubin - Recheck liver function tests and f/u in 2 days  Chemotherapy-induced cytopenias (anemia, leukopenia, thrombocytopenia) Blood counts indicate anemia, leukopenia, and thrombocytopenia likely due to chemotherapy.  Cancer-related pain (skin and breast) Significant skin pain described as burning. Current pain management includes Percocet, but excessive Tylenol  intake is a concern. - Stop Percocet due to Tylenol  content and switch to oxycodone . - Try numbing cream on the skin to alleviate burning sensation. - Consider palliative referral for assistance with pain management    The above was reviewed with Dr. Odean in its entirety and he agreed with the assessment and plan and helped to formulate it.   All questions were answered. The patient knows to call the clinic with any problems, questions or concerns. We can certainly see the patient much sooner if necessary.  Total encounter time:45 minutes*in face-to-face visit time, chart review, lab review, care coordination, order entry, and documentation of the encounter time.    Morna Kendall, NP 07/31/24 2:39  PM Medical Oncology and Hematology [facility] [address] [place], [place] [address] Tel. [phone]    Fax. [phone]  *Total Encounter Time as defined by the Centers for Medicare and Medicaid Services includes, in addition to the face-to-face time of a patient visit (documented in the note above) non-face-to-face time: obtaining and reviewing outside history, ordering and reviewing medications, tests or procedures, care coordination (communications with other health care professionals or caregivers) and documentation in the medical record.

## 2024-07-31 NOTE — Progress Notes (Unsigned)
 CRITICAL VALUE STICKER  CRITICAL VALUE: total bilirubin 4.7  RECEIVER (on-site recipient of call): [facility]  DATE & TIME NOTIFIED: 07/31/24 @ 1030  MESSENGER (representative from lab): Aldona   MD NOTIFIED: Dr. Odean Jacobsen, DNP  TIME OF NOTIFICATION: [phone]  RESPONSE: patient has appt with Jacobsen, DNP

## 2024-07-31 NOTE — Assessment & Plan Note (Signed)
 06/30/2011: Left breast cancer stage III triple negative, 7.6 cm 09/14/2011: Dose dense FEC followed by dose dense Taxotere  neoadjuvant chemo 12/21/2011: Left lumpectomy: High-grade poorly differentiated IDC 1.7 cm 3/7 nodes positive triple negative, XRT 09/26/2017: Relapse: Right axillary lymph node biopsy positive for metastatic high-grade carcinoma triple negative 11/14/2017: Neoadjuvant Gemzar  and carboplatin  05/31/2023: Relapse: Mediastinal mass biopsy: Metastatic poorly differentiated carcinoma ER 60%, HER2 negative 06/06/2023: Verzinio with letrozole  07/25/2023: Trodelvy  02/21/2024: Chest wall skin biopsy: Poorly differentiated carcinoma ER 0% PR 0% Ki-67 40%, HER2 0 --------------------------------------------------------------------------------------------------------------------- Caris molecular testing: PD-L1 mutation: Adding Keytruda  to Trodelvy  based on clinical trial ASCENT 04 (median PFS 11.2 months versus 7.8 months)  PET-CT: 05/16/2024: Increased metabolic activity throughout both breast suggestive of progressive breast malignancy.  Progressive metastatic nodal disease and soft tissue nodules.  Stable anterior mediastinal soft tissue lymph nodes.  Decreased sternal activity.  Enlarging right middle lobe 6 mm lung metastases.  Increase in peripancreatic, retroperitoneal, para-aortic, aortocaval lymph nodes conglomerate measures 7.1 cm Current Treatment: Trodelvy  and Keytruda  (Keytruda  added on 05/22/2024)  Patient met with myself and Dr. Odean today to discuss her breast wound and PET scan.

## 2024-07-31 NOTE — Assessment & Plan Note (Signed)
 06/30/2011: Left breast cancer stage III triple negative, 7.6 cm 09/14/2011: Dose dense FEC followed by dose dense Taxotere  neoadjuvant chemo 12/21/2011: Left lumpectomy: High-grade poorly differentiated IDC 1.7 cm 3/7 nodes positive triple negative, XRT 09/26/2017: Relapse: Right axillary lymph node biopsy positive for metastatic high-grade carcinoma triple negative 11/14/2017: Neoadjuvant Gemzar  and carboplatin  05/31/2023: Relapse: Mediastinal mass biopsy: Metastatic poorly differentiated carcinoma ER 60%, HER2 negative 06/06/2023: Verzinio with letrozole  07/25/2023: Trodelvy  02/21/2024: Chest wall skin biopsy: Poorly differentiated carcinoma ER 0% PR 0% Ki-67 40%, HER2 0 --------------------------------------------------------------------------------------------------------------------- Caris molecular testing: PD-L1 mutation: Adding Keytruda  to Trodelvy  based on clinical trial ASCENT 04 (median PFS 11.2 months versus 7.8 months)  PET-CT: 05/16/2024: Increased metabolic activity throughout both breast suggestive of progressive breast malignancy.  Progressive metastatic nodal disease and soft tissue nodules.  Stable anterior mediastinal soft tissue lymph nodes.  Decreased sternal activity.  Enlarging right middle lobe 6 mm lung metastases.  Increase in peripancreatic, retroperitoneal, para-aortic, aortocaval lymph nodes conglomerate measures 7.1 cm Current Treatment: Trodelvy  and Keytruda  (Keytruda  added on 05/22/2024)

## 2024-07-31 NOTE — Progress Notes (Signed)
 TBIL 4.1 Result read back and verified with Diane Bell, RN at 1150 on 07/31/2024 by JP.

## 2024-07-31 NOTE — Progress Notes (Signed)
  Device system confirmed to be MRI conditional, with implant date > 6 weeks ago, and no evidence of abandoned or epicardial leads in review of most recent CXR  Device last cleared by EP Provider: Tatiana Passey  08/01/2024)  Clearance is good through for 1 year as long as parameters remain stable at time of check. If pt undergoes a cardiac device procedure during that time, they should be re-cleared.   Tachy-therapies to be programmed off if applicable with device back to pre-MRI settings after completion of exam.  [employer] - Programming recommendation received through [employer] App/Tablet  Ida Velma Righter  07/31/2024 3:14 PM

## 2024-07-31 NOTE — Telephone Encounter (Deleted)
.  crit 

## 2024-08-01 ENCOUNTER — Other Ambulatory Visit

## 2024-08-01 ENCOUNTER — Other Ambulatory Visit: Payer: Self-pay | Admitting: Adult Health

## 2024-08-01 ENCOUNTER — Other Ambulatory Visit: Payer: Self-pay

## 2024-08-01 ENCOUNTER — Encounter: Payer: Self-pay | Admitting: Pharmacist

## 2024-08-01 ENCOUNTER — Ambulatory Visit (HOSPITAL_COMMUNITY)
Admission: RE | Admit: 2024-08-01 | Discharge: 2024-08-01 | Disposition: A | Discharge status: Home / Self Care | Source: Ambulatory Visit | Attending: Adult Health | Admitting: Adult Health

## 2024-08-01 ENCOUNTER — Inpatient Hospital Stay

## 2024-08-01 ENCOUNTER — Other Ambulatory Visit (HOSPITAL_BASED_OUTPATIENT_CLINIC_OR_DEPARTMENT_OTHER): Payer: Self-pay

## 2024-08-01 DIAGNOSIS — C50012 Malignant neoplasm of nipple and areola, left female breast: Secondary | ICD-10-CM

## 2024-08-01 DIAGNOSIS — C50919 Malignant neoplasm of unspecified site of unspecified female breast: Secondary | ICD-10-CM

## 2024-08-01 LAB — T4: T4, Total: 8.9 ug/dL (ref 4.5–12.0)

## 2024-08-01 NOTE — Progress Notes (Signed)
 Patient was monitored by this RN during MRI scan due to the presence of a pacemaker. Cardiac rhythm was continuously monitored throughout the procedure. Prior to the start of the scan, the pacemaker was placed in MRI-safe mode by the MRI technician and [employer] pacemaker representative. Following completion of the scan, the device was returned to its pre-MRI settings. Neurological status and orientation post-procedure were unchanged from baseline. Patient became uncomfortable in the MRI complaining of pain to skin where and requested to stop scan short of completion. Patient stated pain is from my skin cancer. Patient left via wheelchair with no acute distress noted.  Pre-procedure Heart Rate (Prior to being placed in MRI safe mode): 115  Post-procedure Heart Rate (Once pacemaker is returned to baseline mode): 113

## 2024-08-02 ENCOUNTER — Other Ambulatory Visit: Payer: Self-pay

## 2024-08-02 ENCOUNTER — Encounter (HOSPITAL_COMMUNITY): Payer: Self-pay | Admitting: Internal Medicine

## 2024-08-02 ENCOUNTER — Encounter (HOSPITAL_COMMUNITY): Payer: Self-pay

## 2024-08-02 ENCOUNTER — Encounter: Payer: Self-pay | Admitting: Adult Health

## 2024-08-02 ENCOUNTER — Observation Stay (HOSPITAL_COMMUNITY)
Admission: AD | Admit: 2024-08-02 | Discharge: 2024-08-03 | Disposition: A | Discharge status: Home / Self Care | Source: Ambulatory Visit | Attending: Internal Medicine | Admitting: Internal Medicine

## 2024-08-02 ENCOUNTER — Inpatient Hospital Stay: Admitting: Adult Health

## 2024-08-02 ENCOUNTER — Other Ambulatory Visit

## 2024-08-02 VITALS — BP 122/76 | HR 111 | Temp 97.9°F | Resp 18 | Wt 204.4 lb

## 2024-08-02 DIAGNOSIS — Z79899 Other long term (current) drug therapy: Secondary | ICD-10-CM | POA: Insufficient documentation

## 2024-08-02 DIAGNOSIS — D649 Anemia, unspecified: Secondary | ICD-10-CM | POA: Insufficient documentation

## 2024-08-02 DIAGNOSIS — R17 Unspecified jaundice: Secondary | ICD-10-CM | POA: Insufficient documentation

## 2024-08-02 DIAGNOSIS — R7401 Elevation of levels of liver transaminase levels: Secondary | ICD-10-CM | POA: Insufficient documentation

## 2024-08-02 DIAGNOSIS — C771 Secondary and unspecified malignant neoplasm of intrathoracic lymph nodes: Secondary | ICD-10-CM

## 2024-08-02 DIAGNOSIS — D72819 Decreased white blood cell count, unspecified: Secondary | ICD-10-CM | POA: Insufficient documentation

## 2024-08-02 DIAGNOSIS — Z452 Encounter for adjustment and management of vascular access device: Secondary | ICD-10-CM | POA: Insufficient documentation

## 2024-08-02 DIAGNOSIS — K8020 Calculus of gallbladder without cholecystitis without obstruction: Secondary | ICD-10-CM | POA: Insufficient documentation

## 2024-08-02 DIAGNOSIS — N6331 Unspecified lump in axillary tail of the right breast: Secondary | ICD-10-CM

## 2024-08-02 DIAGNOSIS — C50012 Malignant neoplasm of nipple and areola, left female breast: Secondary | ICD-10-CM

## 2024-08-02 DIAGNOSIS — Z171 Estrogen receptor negative status [ER-]: Secondary | ICD-10-CM

## 2024-08-02 DIAGNOSIS — G4733 Obstructive sleep apnea (adult) (pediatric): Secondary | ICD-10-CM | POA: Insufficient documentation

## 2024-08-02 DIAGNOSIS — E66811 Obesity, class 1: Secondary | ICD-10-CM | POA: Insufficient documentation

## 2024-08-02 DIAGNOSIS — E876 Hypokalemia: Secondary | ICD-10-CM | POA: Insufficient documentation

## 2024-08-02 DIAGNOSIS — F419 Anxiety disorder, unspecified: Secondary | ICD-10-CM | POA: Insufficient documentation

## 2024-08-02 DIAGNOSIS — C50611 Malignant neoplasm of axillary tail of right female breast: Secondary | ICD-10-CM

## 2024-08-02 DIAGNOSIS — J90 Pleural effusion, not elsewhere classified: Secondary | ICD-10-CM | POA: Insufficient documentation

## 2024-08-02 DIAGNOSIS — C50919 Malignant neoplasm of unspecified site of unspecified female breast: Secondary | ICD-10-CM

## 2024-08-02 DIAGNOSIS — Z6832 Body mass index (BMI) 32.0-32.9, adult: Secondary | ICD-10-CM | POA: Insufficient documentation

## 2024-08-02 DIAGNOSIS — D61818 Other pancytopenia: Secondary | ICD-10-CM | POA: Insufficient documentation

## 2024-08-02 DIAGNOSIS — Z95828 Presence of other vascular implants and grafts: Secondary | ICD-10-CM

## 2024-08-02 DIAGNOSIS — Z5111 Encounter for antineoplastic chemotherapy: Secondary | ICD-10-CM | POA: Diagnosis not present

## 2024-08-02 LAB — CBC WITH DIFFERENTIAL (CANCER CENTER ONLY)
Abs Immature Granulocytes: 0.25 K/uL — ABNORMAL HIGH (ref 0.00–0.07)
Basophils Absolute: 0 K/uL (ref 0.0–0.1)
Basophils Relative: 0 %
Eosinophils Absolute: 0 K/uL (ref 0.0–0.5)
Eosinophils Relative: 0 %
HCT: 23.5 % — ABNORMAL LOW (ref 36.0–46.0)
Hemoglobin: 7.7 g/dL — ABNORMAL LOW (ref 12.0–15.0)
Immature Granulocytes: 11 %
Lymphocytes Relative: 13 %
Lymphs Abs: 0.3 K/uL — ABNORMAL LOW (ref 0.7–4.0)
MCH: 31.7 pg (ref 26.0–34.0)
MCHC: 32.8 g/dL (ref 30.0–36.0)
MCV: 96.7 fL (ref 80.0–100.0)
Monocytes Absolute: 0.3 K/uL (ref 0.1–1.0)
Monocytes Relative: 14 %
Neutro Abs: 1.4 K/uL — ABNORMAL LOW (ref 1.7–7.7)
Neutrophils Relative %: 62 %
Platelet Count: 130 K/uL — ABNORMAL LOW (ref 150–400)
RBC: 2.43 MIL/uL — ABNORMAL LOW (ref 3.87–5.11)
RDW: 22.3 % — ABNORMAL HIGH (ref 11.5–15.5)
WBC Count: 2.2 K/uL — ABNORMAL LOW (ref 4.0–10.5)
nRBC: 1.3 % — ABNORMAL HIGH (ref 0.0–0.2)

## 2024-08-02 LAB — CMP (CANCER CENTER ONLY)
ALT: 152 U/L — ABNORMAL HIGH (ref 0–44)
AST: 79 U/L — ABNORMAL HIGH (ref 15–41)
Albumin: 3.5 g/dL (ref 3.5–5.0)
Alkaline Phosphatase: 716 U/L — ABNORMAL HIGH (ref 38–126)
Anion gap: 9 (ref 5–15)
BUN: 13 mg/dL (ref 6–20)
CO2: 27 mmol/L (ref 22–32)
Calcium: 9.5 mg/dL (ref 8.9–10.3)
Chloride: 101 mmol/L (ref 98–111)
Creatinine: 0.98 mg/dL (ref 0.44–1.00)
GFR, Estimated: >60 mL/min (ref >60)
Glucose, Bld: 130 mg/dL — ABNORMAL HIGH (ref 70–99)
Potassium: 3.3 mmol/L — ABNORMAL LOW (ref 3.5–5.1)
Sodium: 137 mmol/L (ref 135–145)
Total Bilirubin: 5.6 mg/dL (ref 0.0–1.2)
Total Protein: 6.9 g/dL (ref 6.5–8.1)

## 2024-08-02 LAB — MAGNESIUM: Magnesium: 1.7 mg/dL (ref 1.7–2.4)

## 2024-08-02 MED ORDER — ONDANSETRON HCL 4 MG PO TABS: 4.0000 mg | ORAL_TABLET | Freq: Four times a day (QID) | ORAL | Status: DC | PRN

## 2024-08-02 MED ORDER — POLYETHYLENE GLYCOL 3350 17 G PO PACK
17.0000 g | PACK | Freq: Every day | ORAL | Status: DC | PRN
  Filled 2024-08-02: qty 1

## 2024-08-02 MED ORDER — OXYCODONE HCL 5 MG PO TABS
5.0000 mg | ORAL_TABLET | ORAL | Status: DC | PRN
  Administered 2024-08-02 – 2024-08-03 (×2): 5 mg via ORAL
  Filled 2024-08-02 (×2): qty 1

## 2024-08-02 MED ORDER — ENSURE PLUS HIGH PROTEIN PO LIQD
237.0000 mL | Freq: Two times a day (BID) | ORAL | Status: DC
  Administered 2024-08-03: 237 mL via ORAL

## 2024-08-02 MED ORDER — ONDANSETRON HCL 4 MG/2ML IJ SOLN
4.0000 mg | Freq: Four times a day (QID) | INTRAMUSCULAR | Status: DC | PRN
  Administered 2024-08-03: 4 mg via INTRAVENOUS
  Filled 2024-08-02: qty 2

## 2024-08-02 MED ORDER — POTASSIUM CHLORIDE ER 10 MEQ PO TBCR
40.0000 meq | EXTENDED_RELEASE_TABLET | Freq: Once | ORAL | Status: DC
  Filled 2024-08-02 (×3): qty 4

## 2024-08-02 MED ORDER — SODIUM CHLORIDE 0.9% FLUSH: 10.0000 mL | INTRAVENOUS | Status: DC | PRN

## 2024-08-02 MED ORDER — LORAZEPAM 1 MG PO TABS
1.0000 mg | ORAL_TABLET | Freq: Three times a day (TID) | ORAL | Status: DC
Start: 2024-08-02 — End: 2024-08-02

## 2024-08-02 MED ORDER — VITAMIN B-12 1000 MCG PO TABS
5000.0000 ug | ORAL_TABLET | Freq: Every day | ORAL | Status: DC
  Administered 2024-08-03: 5000 ug via ORAL
  Filled 2024-08-02 (×2): qty 5

## 2024-08-02 MED ORDER — LORAZEPAM 1 MG PO TABS: 1.0000 mg | ORAL_TABLET | Freq: Three times a day (TID) | ORAL | Status: DC | PRN

## 2024-08-02 MED ORDER — MAGNESIUM OXIDE -MG SUPPLEMENT 400 (240 MG) MG PO TABS
400.0000 mg | ORAL_TABLET | Freq: Every day | ORAL | Status: DC
  Administered 2024-08-03: 400 mg via ORAL
  Filled 2024-08-02: qty 1

## 2024-08-02 MED ORDER — SODIUM CHLORIDE 0.9% FLUSH
10.0000 mL | Freq: Two times a day (BID) | INTRAVENOUS | Status: DC
  Administered 2024-08-02 – 2024-08-03 (×2): 10 mL

## 2024-08-02 MED ORDER — SODIUM CHLORIDE 0.9 % IV SOLN
INTRAVENOUS | Status: DC
Start: 2024-08-02 — End: 2024-08-03

## 2024-08-02 MED ORDER — METOPROLOL SUCCINATE ER 25 MG PO TB24
25.0000 mg | ORAL_TABLET | Freq: Every day | ORAL | Status: DC
Start: 2024-08-03 — End: 2024-08-03
  Administered 2024-08-03: 25 mg via ORAL
  Filled 2024-08-02: qty 1

## 2024-08-02 MED ORDER — CHLORHEXIDINE GLUCONATE CLOTH 2 % EX PADS
6.0000 | MEDICATED_PAD | Freq: Every day | CUTANEOUS | Status: DC
Start: 2024-08-02 — End: 2024-08-03
  Administered 2024-08-03: 6 via TOPICAL

## 2024-08-02 MED ORDER — ALBUTEROL SULFATE (2.5 MG/3ML) 0.083% IN NEBU: 3.0000 mL | INHALATION_SOLUTION | Freq: Four times a day (QID) | RESPIRATORY_TRACT | Status: DC | PRN

## 2024-08-02 MED ORDER — DIPHENOXYLATE-ATROPINE 2.5-0.025 MG PO TABS: 1.0000 | ORAL_TABLET | Freq: Four times a day (QID) | ORAL | Status: DC | PRN

## 2024-08-02 MED ORDER — LORATADINE 10 MG PO TABS: 10.0000 mg | ORAL_TABLET | Freq: Every day | ORAL | Status: DC | PRN

## 2024-08-02 MED ORDER — AMLODIPINE BESYLATE 10 MG PO TABS
10.0000 mg | ORAL_TABLET | Freq: Every day | ORAL | Status: DC
  Administered 2024-08-03: 10 mg via ORAL
  Filled 2024-08-02 (×2): qty 1

## 2024-08-02 MED ORDER — SULFAMETHOXAZOLE-TRIMETHOPRIM 800-160 MG PO TABS
1.0000 | ORAL_TABLET | ORAL | Status: DC
  Administered 2024-08-03: 1 via ORAL
  Filled 2024-08-02: qty 1

## 2024-08-02 NOTE — Progress Notes (Signed)
 [facility] Cancer Follow up:    Jessica Clem Pitts, MD [address] 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 KI-67 of 99%    - Pathologic: No stage assigned - Unsigned Specimen type: Core Needle Biopsy Histopathologic type: Infiltrating duct carcinoma, NOS Tumor size (mm): 66 Prognostic indicators: Triple negative. 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)  06/30/2011 Initial Diagnosis   Breast cancer, IDC, Left, Stage III, Triple negative, 7.6 cm breast mass and palpable axillary mass   09/06/2011 Miscellaneous   BRCA 1 and 2: Negative   09/14/2011 - 11/23/2011 Neo-Adjuvant Chemotherapy   Dose dense FEC followed by dose dense Taxotere    12/21/2011 Surgery   Left lumpectomy: High-grade poorly differentiated IDC 1.7 cm with high-grade DCIS, margins negative, 3/7 lymph nodes positive, ER 0%, PR 0%, HER-2 negative ratio 1.33 T1CN1 stage IIb   12/31/2011 - 02/15/2012 Radiation Therapy   Radiation at [facility]   09/26/2017 Relapse/Recurrence   Left breast upper outer quadrant within  the lumpectomy bed: Fibrosis no malignancy, right axillary lymph node biopsy metastatic high-grade carcinoma ER 0%, PR 0%, Ki-67 90%, HER-2 negative ratio 1.11 (similar to previous ductal carcinoma)   11/14/2017 - 03/06/2018 Neo-Adjuvant Chemotherapy   Neo-Adjuvant chemotherapy with Gemzar  and carboplatin  days 1 and 8 every 3 weeks   01/10/2018 Genetic Testing   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  The Hereditary Gene Panel offered by [facility] includes sequencing and/or deletion duplication testing of the following 47 genes: APC, ATM, AXIN2, BARD1, BMPR1A, BRCA1, BRCA2, BRIP1, CDH1, CDK4, CDKN2A (p14ARF), CDKN2A (p16INK4a), CHEK2, CTNNA1, DICER1, EPCAM (Deletion/duplication testing only), GREM1 (promoter region deletion/duplication testing only), KIT, MEN1, MLH1, MSH2, MSH3, MSH6, MUTYH, NBN, NF1, NHTL1, PALB2, PDGFRA, PMS2, POLD1, POLE, PTEN, RAD50, RAD51C, RAD51D, SDHB, SDHC, SDHD, SMAD4, SMARCA4. STK11, TP53, TSC1, TSC2, and VHL.  The following genes were evaluated for sequence changes only: SDHA and HOXB13 c.251G>A variant only. The report date is January 10, 2018.    05/31/2023 Procedure   Mediastinal mass biopsy: Metastatic poorly differentiated carcinoma compatible with breast primary ER +60%, PR 0%, HER2 0, Ki-67 40%    06/02/2023 PET scan   PET/CT Anterior mediastinal mass is hypermetabolic and centrally necrotic, soft tissue lesions anterior to the heart, 10 mm pleural-based lesion, level 2 cervical nodes, hypermetabolism hepatic dome mottled FDG accumulation throughout the skeletal system    06/06/2023 - 07/14/2023 Anti-estrogen oral therapy   Antiestrogen therapy with Verzenio  and letrozole     07/07/2023 Relapse/Recurrence   Increasing mediastinal mass 11.6 cm (used to be 10.8 cm) effacing the heart and involving the sternum, additional mediastinal/pericardial lymph nodes increased right extra  lymph node 1.5 cm, nodules 0.2 cm (used to be 0.8 cm), few  new scattered lung nodules)   07/25/2023 -  Chemotherapy   Patient is on Treatment Plan : BREAST METASTATIC Sacituzumab govitecan -hziy (Trodelvy ) D1,8 q21d     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)  04/06/2018 Surgery   Right axillary lymph node dissection: 1/11 node positive for metastatic high-grade carcinoma   04/25/2018 Initial Diagnosis   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  History of Present Illness Jessica Simpson is a 53 year old female with cancer who presents with lymph node enlargement causing bile duct obstruction.  She has elevated liver enzymes and a bilirubin level of 5.6. She was seen in clinic a couple of days ago to review her PET scan and discuss next steps.  PET scan on 07/26/2024 demonstrated slight progression in her inguinal adenopathy, but otherwise appeared stable.  On the day of her visit she was noted to have a t bili of 4.7.  When fractionated it was 3.3 direct, 0.8 indirect.  STAT MRCP was ordered and completed yesterday that demonstrated marked intrahepatic biliary ductal dilatation and mildly dilated common bile duct with abrupt tapering near the pancreatic head, most consistent with extrinsic biliary obstruction from multiple enlarged porta hepatis lymph nodes.  Large gallstone with minimally distended gallbladder without evidence of cholecystitis.   She has a fungating breast tumor that she applies gauze  dressing to (recent pictures located in the media tab).  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  I prescribed Oxycodone  5mg  recently and discontinued percocet due to elevating LFTs.  Her pain is rated at 6 to 7 out of 10 before medication and decreases to 2 to 3 out of 10 after taking it.  It lasts for about 5 hours and does not make her too groggy.   She has a decreased appetite and notes it is slightly better today.  She notes mild constipation.   She is tired and has stable shortness of breath.  Her hemoglobin today is 7.7.         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   High degree atrioventricular block 11/15/2023   Secondary malignancy of mediastinal lymph nodes (HCC) 07/20/2023   PMB (postmenopausal bleeding) 01/26/2022   Papanicolaou smear of cervix with positive high risk human papilloma virus (HPV) test 12/25/2020   Routine medical exam 12/19/2020   Encounter for screening fecal occult blood testing 12/19/2020   Hypertension 09/01/2018   Screening for colorectal cancer 09/01/2018   Encounter for gynecological examination with Papanicolaou smear of cervix 09/01/2018   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) 04/25/2018   Genetic testing 01/12/2018   Family history of breast cancer    Port-A-Cath in place 11/21/2017   Mass of axillary tail of right breast 08/24/2017   Pain with urination  08/24/2017   Urinary frequency 08/24/2017   Hematuria 08/24/2017   Encounter for well woman exam with routine gynecological exam 01/15/2016   Benign cyst of right breast 11/29/2015   Positive fecal occult blood test 07/22/2014   History of breast cancer 07/22/2014   Rectal bleeding 07/10/2014   Lymphedema 06/15/2012   BRCA1 negative 10/22/2011   BRCA2 negative 10/22/2011   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  MEDICAL HISTORY: Past Medical History:  Diagnosis Date   BRCA1 negative 10/22/2011   BRCA2 negative 10/22/2011   Breast cancer, IDC, Left, Stage III, Triple negative 06/30/2011   Breast disorder    Contraceptive education 01/15/2016   Cough    Dyspnea    Family history of breast cancer    Fracture    right 4th toe   History of breast cancer 07/22/2014   History of radiation therapy 05/16/18- 06/22/18   Right Breast/ 50.4 Gy in 28 fractions. Right posterior axilla and SCV nodes/ 50.4 Gy in 28 fractions.    History of radiation therapy    04/16/24 - 04/30/24 Dr. 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 She then received a boost to the left lumpectomy of 10 gray in 5 fractions. This was given at [facility] .   Sleep apnea    Status post chemotherapy Comp. 11/23/11   FEC and Taxotere     SURGICAL HISTORY: Past Surgical History:  Procedure Laterality Date   anal ascess     turned into a fistula with extensive treatment   AXILLARY LYMPH NODE BIOPSY Right 04/06/2018   AXILLARY LYMPH NODE DISSECTION Right 04/06/2018   Procedure: RIGHT AXILLARY LYMPH NODE DISSECTION;  Surgeon: Ebbie Cough, MD;  Location: [facility] OR;  Service: General;  Laterality: Right;   BREAST BIOPSY Left 2012   BREAST BIOPSY Right 2019   BREAST LUMPECTOMY Left 2012   BREAST LUMPECTOMY Right 2019   rt axilla   BREAST SURGERY     CARDIAC ELECTROPHYSIOLOGY  MAPPING AND ABLATION     CESAREAN SECTION     COLONOSCOPY N/A 08/14/2014   Procedure: COLONOSCOPY;  Surgeon: Claudis RAYMOND Rivet, MD;  Location: [facility];  Service: Endoscopy;  Laterality: N/A;  930   EVACUATION BREAST HEMATOMA  12/21/2011   Procedure: EVACUATION HEMATOMA BREAST;  Surgeon: Jina Nephew, MD;  Location: [facility] OR;  Service: General;  Laterality: Left;   HAND SURGERY     tumor on finger, left hand   INCISION AND DRAINAGE ABSCESS ANAL     x3   IR IMAGING GUIDED PORT INSERTION  08/01/2023   left breast biopsy with axillary biopsy     core bx   PORT-A-CATH REMOVAL  12/21/2011   Procedure: REMOVAL PORT-A-CATH;  Surgeon: Sherlean JINNY Laughter, MD;  Location: [facility];  Service: General;  Laterality: N/A;   PORTACATH PLACEMENT  08/02/2011   dr Merrilyn   PORTACATH PLACEMENT Right 11/14/2017   Procedure: INSERTION PORT-A-CATH WITH US ;  Surgeon: Ebbie Cough, MD;  Location: [facility] OR;  Service: General;  Laterality: Right;   removal breast mass  2004   benign   TREATMENT FISTULA ANAL     TUMOR REMOVAL     on left hand   TUMOR REMOVAL     rt. eye    SOCIAL HISTORY: Social History   Socioeconomic History   Marital status: Single    Spouse name: Not on file   Number of children: Not on file   Years of education: Not on file   Highest education level: Not on file  Occupational History   Not on file  Tobacco Use   Smoking status: Never   Smokeless tobacco: Never  Vaping Use   Vaping status: Never Used  Substance and Sexual Activity   Alcohol use: No   Drug use: No   Sexual activity: Yes    Birth control/protection: Post-menopausal  Other Topics Concern   Not on file  Social History Narrative   Not on file   Social Drivers of Health   Financial Resource Strain: Low Risk  (01/26/2022)   Overall Financial Resource Strain (CARDIA)    Difficulty of Paying Living Expenses: Not very hard  Food Insecurity: No Food Insecurity (04/04/2024)   Hunger Vital Sign     Worried About Running Out of Food in the Last Year: Never true    Ran Out of Food in the Last Year: Never true  Transportation Needs: No Transportation Needs (04/04/2024)   PRAPARE - Administrator, Civil Service (Medical): No    Lack of Transportation (Non-Medical): No  Physical Activity: Insufficiently Active (01/26/2022)   Exercise Vital Sign    Days of Exercise per Week: 3 days    Minutes of Exercise per Session: 30 min  Stress: No Stress Concern Present (01/26/2022)   [employer] of Occupational Health - Occupational Stress Questionnaire    Feeling of Stress : Only a little  Social Connections: Moderately Integrated (01/26/2022)   Social Connection and Isolation Panel    Frequency of Communication with Friends and Family: More than three times a week    Frequency of Social Gatherings with Friends and Family: Once a week    Attends Religious Services: More than 4 times per year    Active Member of [employer] or Organizations: Yes    Attends Engineer, structural: More than 4 times per year    Marital Status: Never married  Intimate Partner Violence: Not At Risk (04/04/2024)   Humiliation, Afraid, Rape, and Kick questionnaire    Fear of Current or Ex-Partner: No    Emotionally Abused: No    Physically Abused: No    Sexually Abused: No    FAMILY HISTORY: Family History  Problem Relation Age of Onset   Cancer Father 12       lung cancer    Cancer Maternal Grandmother        breast   Cancer Maternal Grandfather    Breast cancer Other        MGMs mother   Colon cancer Neg Hx     Review of Systems  Constitutional:  Positive for fatigue. Negative for appetite change, chills, fever and unexpected weight change.  HENT:   Negative for hearing loss, lump/mass and trouble swallowing.   Eyes:  Negative for eye problems and icterus.  Respiratory:  Positive for shortness  of breath (chronic and stable). Negative for chest tightness and cough.   Cardiovascular:   Negative for chest pain, leg swelling and palpitations.  Gastrointestinal:  Positive for constipation. Negative for abdominal distention, abdominal pain, diarrhea, nausea and vomiting.  Endocrine: Negative for hot flashes.  Genitourinary:  Negative for difficulty urinating.   Musculoskeletal:  Negative for arthralgias.  Skin:  Positive for wound. Negative for itching and rash.  Neurological:  Negative for dizziness, extremity weakness, headaches and numbness.  Hematological:  Negative for adenopathy. Does not bruise/bleed easily.  Psychiatric/Behavioral:  Negative for depression. The patient is not nervous/anxious.       PHYSICAL EXAMINATION    Vitals:   08/02/24 1202  BP: 122/76  Pulse: (!) 111  Resp: 18  Temp: 97.9 F (36.6 C)  SpO2: 100%    Physical Exam Constitutional:      General: She is not in acute distress.    Appearance: Normal appearance. She is not toxic-appearing.  HENT:     Head: Normocephalic and atraumatic.     Mouth/Throat:     Mouth: Mucous membranes are moist.     Pharynx: Oropharynx is clear. No oropharyngeal exudate or posterior oropharyngeal erythema.  Eyes:     General: No scleral icterus. Cardiovascular:     Rate and Rhythm: Regular rhythm. Tachycardia present.     Pulses: Normal pulses.     Heart sounds: Normal heart sounds.  Pulmonary:     Effort: Pulmonary effort is normal.     Breath sounds: Normal breath sounds.  Abdominal:     General: Abdomen is flat. Bowel sounds are normal. There is no distension.     Palpations: Abdomen is soft.     Tenderness: There is no abdominal tenderness.  Musculoskeletal:        General: No swelling.     Cervical back: Neck supple.  Lymphadenopathy:     Cervical: No cervical adenopathy.  Skin:    General: Skin is warm and dry.     Findings: No rash.  Neurological:     General: No focal deficit present.     Mental Status: She is alert.  Psychiatric:        Mood and Affect: Mood normal.         Behavior: Behavior normal.     LABORATORY DATA:  CBC    Component Value Date/Time   WBC 2.2 (L) 08/02/2024 1141   WBC 5.1 05/31/2023 0831   RBC 2.43 (L) 08/02/2024 1141   HGB 7.7 (L) 08/02/2024 1141   HGB 12.2 07/26/2016 1650   HCT 23.5 (L) 08/02/2024 1141   HCT 37.5 07/26/2016 1650   PLT 130 (L) 08/02/2024 1141   PLT 294 07/26/2016 1650   MCV 96.7 08/02/2024 1141   MCV 80 07/26/2016 1650   MCH 31.7 08/02/2024 1141   MCHC 32.8 08/02/2024 1141   RDW 22.3 (H) 08/02/2024 1141   RDW 14.0 07/26/2016 1650   LYMPHSABS 0.3 (L) 08/02/2024 1141   MONOABS 0.3 08/02/2024 1141   EOSABS 0.0 08/02/2024 1141   BASOSABS 0.0 08/02/2024 1141    CMP     Component Value Date/Time   NA 137 08/02/2024 1141   NA 140 07/26/2016 1650   K 3.3 (L) 08/02/2024 1141   CL 101 08/02/2024 1141   CO2 27 08/02/2024 1141   GLUCOSE 130 (H) 08/02/2024 1141   BUN 13 08/02/2024 1141   BUN 13 07/26/2016 1650   CREATININE 0.98 08/02/2024 1141   CALCIUM 9.5 08/02/2024 1141  PROT 6.9 08/02/2024 1141   PROT 7.4 07/26/2016 1650   ALBUMIN 3.5 08/02/2024 1141   ALBUMIN 4.2 07/26/2016 1650   AST 79 (H) 08/02/2024 1141   ALT 152 (H) 08/02/2024 1141   ALKPHOS 716 (H) 08/02/2024 1141   BILITOT 5.6 (HH) 08/02/2024 1141   GFRNONAA >60 08/02/2024 1141   GFRAA >60 12/21/2018 1402   GFRAA >60 10/24/2018 1053     ASSESSMENT and THERAPY PLAN:   Assessment and Plan Assessment & Plan Malignant lymphadenopathy causing biliary obstruction Lymph node enlargement due to malignancy obstructing bile duct, elevated liver enzymes and bilirubin (5.6), risk of liver failure. - Admit for pain management and further evaluation. - Consult for ERCP to relieve biliary obstruction.  Neoplasm-related pain Significant pain, rated 6-7 before medication, 2-3 after medication, requires optimized management. - Optimize pain control during hospital admission.  The above assessment and plan was made in consultation and direction  from Dr. Odean.  She is aware and in agreement with the above.      All questions were answered. The patient knows to call the clinic with any problems, questions or concerns. We can certainly see the patient much sooner if necessary.  Total encounter time:40 minutes*in face-to-face visit time, chart review, lab review, care coordination, order entry, and documentation of the encounter time.    Morna Kendall, NP 08/02/24 1:33 PM Medical Oncology and Hematology [facility] [address] [place], [place] [address] Tel. [phone]    Fax. [phone]  *Total Encounter Time as defined by the Centers for Medicare and Medicaid Services includes, in addition to the face-to-face time of a patient visit (documented in the note above) non-face-to-face time: obtaining and reviewing outside history, ordering and reviewing medications, tests or procedures, care coordination (communications with other health care professionals or caregivers) and documentation in the medical record.

## 2024-08-02 NOTE — Hospital Course (Addendum)
 53 year old female with history of metastatic breast cancer initial diagnosis 2012 currently followed by oncology who is clinically worsening with skin metastasis and has had elevated bilirubin.  She had outpatient MRCP that showed worsening lymphadenopathy and extrinsic biliary compression, Dr. Lorne from GI was consulted and advised to call patient to see the unassigned patient and admitted from cancer center to [facility] for further evaluation. At the oncology center her vitals were stable pain control and patient was accepted to MedSurg at [facility] Patient has fungating breast tumor, with dressing in place has cancer related pain from her skin metastasis and taking oxycodone , she has a decreased appetite and mild constipation and stable shortness of breath. Her lab work today with hypokalemia 3.3 transaminitis mild overall stable and TB 5.6 hemoglobin 7.7 leukopenia 2.2 thrombocytopenia 130.  On my exam today at 1618: On bedside chair aaox3 having cough dry, mild constipation and some right sided abodomen pain otherwise denies any nausea, vomiting, chest pain, shortness of breath, fever, chills, headache, focal weakness, numbness tingling, speech difficulties   Assessment and plan:  Elevated bilirubin Extrinsic biliary obstruction from multiple enlarged porta hepatis lymph nodes Cholelithiasis minimally distended gallbladder without cholecystitis: MRCP reviewed from 10/8, will consult [facility] **.  Keep on pain management IV fluid hydration, n.p.o. past midnight likely for stenting.  Hypertension: PTA on amlodipine  and metoprolol , med rec pending will resume one of them  Anxiety: Continue  home Ativan  as needed  Metastatic breast cancer with fungating mass Cancer related pain with skin mets. On chemo last month and currently hold. She has had XRT as well. Seh is on bactrim due to wound and will continue the same.  Anemia of malignancy Leukopenia Thrombocytopenia: Pancytopenia  in the setting of patient's malignancy, continue pain management, oncology aware of the admission will notify them tomorrow.  Hypokalemia: Replace potassium  OSA: Not using CPAP for a while due to cough  Class I Obesity w/ Body mass index is 32.93 kg/m.: Will benefit with PCP follow-up, weight loss,healthy lifestyle and outpatient sleep eval if not done.  DVT prophylaxis:  Code Status:   Code Status: Prior Family Communication: plan of care discussed with patient at bedside. Patient status is: Remains hospitalized because of severity of illness Level of care: Med-Surg   Dispo: The patient is from: home            Anticipated disposition: TBD Objective: Vitals last 24 hrs: Vitals:   08/02/24 1706  Weight: 92.5 kg  Height: 5' 6 (1.676 m)    Physical Examination: General exam: alert awake, oriented, older than stated age HEENT:Oral mucosa moist, Ear/Nose WNL grossly Respiratory system: Bilaterally clear BS,no use of accessory muscle Cardiovascular system: S1 & S2 +, No JVD. Gastrointestinal system: Abdomen soft,NT,ND, BS+ Nervous System: Alert, awake, moving all extremities,and following commands. Extremities: extremities warm, leg edema neg Skin: No rashes,no icterus. MSK: Normal muscle bulk,tone, power   Medications reviewed:  Scheduled Meds:  potassium chloride   40 mEq Oral Once   Continuous Infusions:  sodium chloride      Diet: Diet Order             Diet NPO time specified  Diet effective midnight           Diet regular Room service appropriate? Yes; Fluid consistency: Thin  Diet effective now

## 2024-08-02 NOTE — Progress Notes (Signed)
 Pt directly admitted to the room. A&OX4. Assessment and admission completed with Tiffany, LPN.

## 2024-08-02 NOTE — H&P (Signed)
 History and Physical    Jessica Simpson FMW:[id number] DOB: 30-Dec-1970 DOA: 08/02/2024  PCP: [name], MD   [facility] C/C: Abdomen pain     HPI:53 year old female with history of metastatic breast cancer initial diagnosis 2012 currently followed by oncology who is clinically worsening with skin metastasis and has had elevated bilirubin.  She had outpatient MRCP that showed worsening lymphadenopathy and extrinsic biliary compression, Dr. Lorne from GI was consulted and advised to call patient to see the unassigned patient and admitted from cancer center to [facility] for further evaluation. At the oncology center her vitals were stable pain control and patient was accepted to MedSurg at [facility] Patient has fungating breast tumor, with dressing in place has cancer related pain from her skin metastasis and taking oxycodone , she has a decreased appetite and mild constipation and stable shortness of breath. Her lab work today with hypokalemia 3.3 transaminitis mild overall stable and TB 5.6 hemoglobin 7.7 leukopenia 2.2 thrombocytopenia 130.  On my exam today at 1618: On bedside chair aaox3 having cough dry, mild constipation and some right sided abodomen pain otherwise denies any nausea, vomiting, chest pain, shortness of breath, fever, chills, headache, focal weakness, numbness tingling, speech difficulties   Assessment and plan:  Elevated bilirubin Extrinsic biliary obstruction from multiple enlarged porta hepatis lymph nodes Cholelithiasis minimally distended gallbladder without cholecystitis: MRCP reviewed from 10/8, will consult [facility] **.  Keep on pain management IV fluid hydration, n.p.o. past midnight likely for stenting.  Hypertension: PTA on amlodipine  and metoprolol , med rec pending will resume one of them  Anxiety: Continue  home Ativan  as needed  Metastatic breast cancer with fungating mass Cancer related  pain with skin mets. On chemo last month and currently hold. She has had XRT as well. Seh is on bactrim due to wound and will continue the same.  Anemia of malignancy Leukopenia Thrombocytopenia: Pancytopenia in the setting of patient's malignancy, continue pain management, oncology aware of the admission will notify them tomorrow.  Hypokalemia: Replace potassium  OSA: Not using CPAP for a while due to cough  Class I Obesity w/ Body mass index is 32.93 kg/m.: Will benefit with PCP follow-up, weight loss,healthy lifestyle and outpatient sleep eval if not done.  DVT prophylaxis:  Code Status:   Code Status: Prior Family Communication: plan of care discussed with patient at bedside. Patient status is: Remains hospitalized because of severity of illness Level of care: Med-Surg   Dispo: The patient is from: home            Anticipated disposition: TBD Objective: Vitals last 24 hrs: Vitals:   08/02/24 1706  Weight: 92.5 kg  Height: 5' 6 (1.676 m)    Physical Examination: General exam: alert awake, oriented, older than stated age HEENT:Oral mucosa moist, Ear/Nose WNL grossly Respiratory system: Bilaterally clear BS,no use of accessory muscle Cardiovascular system: S1 & S2 +, No JVD. Gastrointestinal system: Abdomen soft,NT,ND, BS+ Nervous System: Alert, awake, moving all extremities,and following commands. Extremities: extremities warm, leg edema neg Skin: No rashes,no icterus. MSK: Normal muscle bulk,tone, power   Medications reviewed:  Scheduled Meds:  potassium chloride   40 mEq Oral Once   Continuous Infusions:  sodium chloride      Diet: Diet Order             Diet NPO time specified  Diet effective midnight           Diet regular Room service appropriate? Yes;  Fluid consistency: Thin  Diet effective now                    Severity of Illness: The appropriate patient status for this patient is INPATIENT. Inpatient status is judged to be reasonable and  necessary in order to provide the required intensity of service to ensure the patient's safety. The patient's presenting symptoms, physical exam findings, and initial radiographic and laboratory data in the context of their chronic comorbidities is felt to place them at high risk for further clinical deterioration. Furthermore, it is not anticipated that the patient will be medically stable for discharge from the hospital within 2 midnights of admission.   * I certify that at the point of admission it is my clinical judgment that the patient will require inpatient hospital care spanning beyond 2 midnights from the point of admission due to high intensity of service, high risk for further deterioration and high frequency of surveillance required.*  Family Communication: Admission, patients condition and plan of care including tests being ordered have been discussed with the patient who indicate understanding and agree with the plan and Code Status.  Consults called:  GI  Review of Systems: All systems were reviewed and were negative except as mentioned in HPI above. Negative for fever Negative for chest pain Negative for shortness of breath  Past Medical History:  Diagnosis Date   BRCA1 negative 10/22/2011   BRCA2 negative 10/22/2011   Breast cancer, IDC, Left, Stage III, Triple negative 06/30/2011   Breast disorder    Contraceptive education 01/15/2016   Cough    Dyspnea    Family history of breast cancer    Fracture    right 4th toe   History of breast cancer 07/22/2014   History of radiation therapy 05/16/18- 06/22/18   Right Breast/ 50.4 Gy in 28 fractions. Right posterior axilla and SCV nodes/ 50.4 Gy in 28 fractions.    History of radiation therapy    04/16/24 - 04/30/24 Dr. 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 She then received a boost to the left lumpectomy of 10 gray in 5 fractions. This was given at [facility] [place] .   Sleep apnea    Status post chemotherapy Comp. 11/23/11   FEC and Taxotere     Past Surgical History:  Procedure Laterality Date   anal ascess     turned into a fistula with extensive treatment   AXILLARY LYMPH NODE BIOPSY Right 04/06/2018   AXILLARY LYMPH NODE DISSECTION Right 04/06/2018   Procedure: RIGHT AXILLARY LYMPH NODE DISSECTION;  Surgeon: Ebbie Cough, MD;  Location: [facility] OR;  Service: General;  Laterality: Right;   BREAST BIOPSY Left 2012   BREAST BIOPSY Right 2019   BREAST LUMPECTOMY Left 2012   BREAST LUMPECTOMY Right 2019   rt axilla   BREAST SURGERY     CARDIAC ELECTROPHYSIOLOGY MAPPING AND ABLATION     CESAREAN SECTION  COLONOSCOPY N/A 08/14/2014   Procedure: COLONOSCOPY;  Surgeon: Claudis RAYMOND Rivet, MD;  Location: [facility];  Service: Endoscopy;  Laterality: N/A;  930   EVACUATION BREAST HEMATOMA  12/21/2011   Procedure: EVACUATION HEMATOMA BREAST;  Surgeon: Jina Nephew, MD;  Location: [facility] OR;  Service: General;  Laterality: Left;   HAND SURGERY     tumor on finger, left hand   INCISION AND DRAINAGE ABSCESS ANAL     x3   IR IMAGING GUIDED PORT INSERTION  08/01/2023   left breast biopsy with axillary biopsy     core bx   PORT-A-CATH REMOVAL  12/21/2011   Procedure: REMOVAL PORT-A-CATH;  Surgeon: Sherlean JINNY Laughter, MD;  Location: [facility];  Service: General;  Laterality: N/A;   PORTACATH PLACEMENT  08/02/2011   dr Laughter PINON PLACEMENT Right 11/14/2017    Procedure: INSERTION PORT-A-CATH WITH US ;  Surgeon: Ebbie Cough, MD;  Location: [facility] OR;  Service: General;  Laterality: Right;   removal breast mass  2004   benign   TREATMENT FISTULA ANAL     TUMOR REMOVAL     on left hand   TUMOR REMOVAL     rt. eye     reports that she has never smoked. She has never used smokeless tobacco. She reports that she does not drink alcohol and does not use drugs.  Allergies  Allergen Reactions   Codeine Nausea And Vomiting   Hydrocodone-Acetaminophen  Itching, Nausea And Vomiting and Other (See Comments)    Lortab    Family History  Problem Relation Age of Onset   Cancer Father 32       lung cancer    Cancer Maternal Grandmother        breast   Cancer Maternal Grandfather    Breast cancer Other        MGMs mother   Colon cancer Neg Hx      Prior to Admission medications   Medication Sig Start Date End Date Taking? Authorizing Provider  albuterol  (ACCUNEB ) 0.63 MG/3ML nebulizer solution Take 3 mLs (0.63 mg total) by nebulization every 6 (six) hours as needed for wheezing. 12/06/23   Gudena, Vinay, MD  amLODipine  (NORVASC ) 5 MG tablet Take 10 mg by mouth daily. 01/05/16   [provider]  Cyanocobalamin  (VITAMIN B-12) 5000 MCG TBDP Take 5,000 mcg by mouth daily.    [provider]  diphenoxylate -atropine  (LOMOTIL ) 2.5-0.025 MG tablet Take 1-2 tablets by mouth 4 (four) times daily as needed for diarrhea or loose stools. 12/27/23   Lanny Callander, MD  ibuprofen  (ADVIL ) 800 MG tablet TAKE 1 TABLET BY MOUTH EVERY 8 HOURS AS NEEDED 05/21/24   Odean Potts, MD  loratadine  (CLARITIN ) 10 MG tablet Take 1 tablet (10 mg total) by mouth daily as needed for allergies. 02/07/24   Gudena, Vinay, MD  LORazepam  (ATIVAN ) 1 MG tablet Take 1 tablet (1 mg total) by mouth every 8 (eight) hours. 01/17/24   Gudena, Vinay, MD  magnesium  oxide (MAG-OX) 400 MG tablet TAKE 1 TABLET BY MOUTH EVERY DAY 06/17/24   Causey, Lindsey Cornetto, NP  metoprolol  succinate  (TOPROL  XL) 25 MG 24 hr tablet Take 1 tablet (25 mg total) by mouth daily. 09/20/23   Waddell Danelle ORN, MD  metroNIDAZOLE  (METROGEL ) 0.75 % gel Apply 1 Application topically 2 (two) times daily. 07/13/24   Crawford Morna Pickle, NP  Omega-3 Fatty Acids (FISH OIL MAXIMUM STRENGTH) 1200 MG CPDR Take 1,200 mg by mouth daily. 12/09/21  [provider]  ondansetron  (ZOFRAN ) 8 MG tablet Take 1 tablet (8 mg total) by mouth 2 (two) times daily. 11/02/23   Gudena, Vinay, MD  oxyCODONE  (OXY IR/ROXICODONE ) 5 MG immediate release tablet Take 1 tablet (5 mg total) by mouth every 4 (four) hours as needed for severe pain (pain score 7-10). 07/31/24   Crawford Morna Pickle, NP  sulfamethoxazole -trimethoprim (BACTRIM DS) 800-160 MG tablet Take 1 tablet by mouth 3 (three) times a week. 07/27/24   Crawford Morna Pickle, NP  Vitamin D , Ergocalciferol , (DRISDOL ) 1.25 MG (50000 UNIT) CAPS capsule Take 1 capsule (50,000 Units total) by mouth once a week. 10/14/23   Crawford Morna Pickle, NP   r   Labs on Admission: I have personally reviewed following labs and imaging studies  CBC: Recent Labs  Lab 07/31/24 0916 08/02/24 1141  WBC 2.2* 2.2*  NEUTROABS 1.4* 1.4*  HGB 7.8* 7.7*  HCT 23.8* 23.5*  MCV 95.2 96.7  PLT 116* 130*   Basic Metabolic Panel: Recent Labs  Lab 07/31/24 0916 08/02/24 1141  NA 136 137  K 3.2* 3.3*  CL 102 101  CO2 26 27  GLUCOSE 116* 130*  BUN 13 13  CREATININE 0.87 0.98  CALCIUM 9.3 9.5  MG 1.7 1.7  PHOS 3.5  --    Estimated Creatinine Clearance: 76.1 mL/min (by C-G formula based on SCr of 0.98 mg/dL). Recent Labs  Lab 07/31/24 0916 08/02/24 1141  AST 92* 79*  ALT 226* 152*  ALKPHOS 746* 716*  BILITOT 4.1*  4.7* 5.6*  PROT 6.9 6.9  ALBUMIN 3.6 3.5   No results for input(s): LIPASE, AMYLASE in the last 168 hours. No results for input(s): AMMONIA in the last 168 hours. Coagulation Profile: No results for input(s): INR, PROTIME in the last 168  hours. Cardiac Panel (last 3 results) No results for input(s): CKTOTAL, CKMB, TROPONINIHS, RELINDX in the last 72 hours.  BNP (last 3 results) No results for input(s): PROBNP in the last 8760 hours. HbA1C: No results for input(s): HGBA1C in the last 72 hours. CBG: No results for input(s): GLUCAP in the last 168 hours. Lipid Profile: No results for input(s): CHOL, HDL, LDLCALC, TRIG, CHOLHDL, LDLDIRECT in the last 72 hours. Thyroid  Function Tests: Recent Labs    07/31/24 0916  T4TOTAL 8.9   Urine analysis:    Component Value Date/Time   COLORURINE AMBER (A) 10/14/2023 1012   APPEARANCEUR HAZY (A) 10/14/2023 1012   APPEARANCEUR Clear 08/24/2017 1600   LABSPEC 1.031 (H) 10/14/2023 1012   PHURINE 5.0 10/14/2023 1012   GLUCOSEU NEGATIVE 10/14/2023 1012   HGBUR NEGATIVE 10/14/2023 1012   BILIRUBINUR MODERATE (A) 10/14/2023 1012   BILIRUBINUR Negative 08/24/2017 1600   KETONESUR NEGATIVE 10/14/2023 1012   PROTEINUR 30 (A) 10/14/2023 1012   NITRITE NEGATIVE 10/14/2023 1012   LEUKOCYTESUR MODERATE (A) 10/14/2023 1012    Radiological Exams on Admission: MR ABDOMEN MRCP WO CONTRAST Result Date: 08/01/2024 EXAM: MRCP WITHOUT IV CONTRAST 08/01/2024 02:55:36 PM TECHNIQUE: Multisequence, multiplanar magnetic resonance images of the abdomen without intravenous contrast. Limited imaging performed only T2 weighted imaging. Patient refused further imaging. MRCP sequences were performed. COMPARISON: PET CT 07/26/2024. CLINICAL HISTORY: Jaundice; hyperbilirubinemia. FINDINGS: LIVER: There is significant intrahepatic ductal dilatation in the left and right hepatic lobe. There is no focal lesion within the liver itself. GALLBLADDER AND BILIARY SYSTEM: Large gallstone within the lumen of the gallbladder measures 2 cm on image 22/4. The gallbladder is minimally distended at 4 cm without evidence of inflammation.  The common bile duct is mildly dilated at 6 mm. There is abrupt  tapering of the common bile duct as it approaches the pancreas on image 17/3. SPLEEN: Unremarkable. PANCREAS/PANCREATIC DUCT: Visualized pancreas is unremarkable. There is no pancreatic ductal dilatation. ADRENAL GLANDS: Unremarkable. KIDNEYS: Unremarkable. LYMPH NODES: Multiple enlarged lymph nodes in the porta hepatis appear to be the etiology of the common bile duct obstruction. An example node measures 3.3 cm on image 25 series 4 near the level of the biliary obstruction. Nodes are present dorsally and ventrally to the common bile duct and adjacent to the pancreatic head. VASCULATURE: Unremarkable. PERITONEUM: No ascites. ABDOMINAL WALL: No hernia. No mass. BOWEL: Grossly unremarkable. No bowel obstruction. BONES: No acute abnormality or worrisome osseous lesion. SOFT TISSUES: Unremarkable. PLEURAL SPACES: Small right pleural effusion. BREASTS: Bilateral breast abnormalities correspond to hypermetallic lesions on comparison PET CT scan. MISCELLANEOUS: Unremarkable. IMPRESSION: 1. Marked intrahepatic biliary ductal dilatation and mildly dilated common bile duct with abrupt tapering near the pancreatic head, most consistent with extrinsic biliary obstruction from multiple enlarged porta hepatis lymph nodes. 2. Large gallstone (2 cm) within a minimally distended gallbladder without evidence of cholecystitis. 3. Small right pleural effusion. 4. Limited exam as patient refused further imaging. only 3 sequences performed . Electronically signed by: Norleen Boxer MD 08/01/2024 03:59 PM EDT RP Workstation: [id number]   Mennie LAMY MD [facility]  If 7PM-7AM, please contact night-coverage [email or web address]  08/02/2024, 5:45 PM

## 2024-08-03 ENCOUNTER — Ambulatory Visit

## 2024-08-03 ENCOUNTER — Other Ambulatory Visit: Payer: Self-pay | Admitting: Gastroenterology

## 2024-08-03 ENCOUNTER — Other Ambulatory Visit: Payer: Self-pay | Admitting: Pharmacist

## 2024-08-03 ENCOUNTER — Ambulatory Visit: Admitting: Adult Health

## 2024-08-03 ENCOUNTER — Other Ambulatory Visit

## 2024-08-03 DIAGNOSIS — G4733 Obstructive sleep apnea (adult) (pediatric): Secondary | ICD-10-CM | POA: Diagnosis not present

## 2024-08-03 DIAGNOSIS — C50611 Malignant neoplasm of axillary tail of right female breast: Secondary | ICD-10-CM | POA: Diagnosis not present

## 2024-08-03 DIAGNOSIS — R17 Unspecified jaundice: Secondary | ICD-10-CM | POA: Diagnosis not present

## 2024-08-03 DIAGNOSIS — C771 Secondary and unspecified malignant neoplasm of intrathoracic lymph nodes: Secondary | ICD-10-CM

## 2024-08-03 DIAGNOSIS — Z95828 Presence of other vascular implants and grafts: Secondary | ICD-10-CM

## 2024-08-03 DIAGNOSIS — Z5111 Encounter for antineoplastic chemotherapy: Secondary | ICD-10-CM | POA: Diagnosis not present

## 2024-08-03 DIAGNOSIS — R7401 Elevation of levels of liver transaminase levels: Principal | ICD-10-CM

## 2024-08-03 LAB — COMPREHENSIVE METABOLIC PANEL WITH GFR
ALT: 160 U/L — ABNORMAL HIGH (ref 0–44)
AST: 103 U/L — ABNORMAL HIGH (ref 15–41)
Albumin: 3.3 g/dL — ABNORMAL LOW (ref 3.5–5.0)
Alkaline Phosphatase: 700 U/L — ABNORMAL HIGH (ref 38–126)
Anion gap: 10 (ref 5–15)
BUN: 12 mg/dL (ref 6–20)
CO2: 24 mmol/L (ref 22–32)
Calcium: 9.2 mg/dL (ref 8.9–10.3)
Chloride: 102 mmol/L (ref 98–111)
Creatinine, Ser: 1.01 mg/dL — ABNORMAL HIGH (ref 0.44–1.00)
GFR, Estimated: >60 mL/min (ref >60)
Glucose, Bld: 100 mg/dL — ABNORMAL HIGH (ref 70–99)
Potassium: 3.9 mmol/L (ref 3.5–5.1)
Sodium: 136 mmol/L (ref 135–145)
Total Bilirubin: 5.3 mg/dL — ABNORMAL HIGH (ref 0.0–1.2)
Total Protein: 6.2 g/dL — ABNORMAL LOW (ref 6.5–8.1)

## 2024-08-03 LAB — CBC
HCT: 22 % — ABNORMAL LOW (ref 36.0–46.0)
Hemoglobin: 6.6 g/dL — CL (ref 12.0–15.0)
MCH: 31 pg (ref 26.0–34.0)
MCHC: 30 g/dL (ref 30.0–36.0)
MCV: 103.3 fL — ABNORMAL HIGH (ref 80.0–100.0)
Platelets: 114 K/uL — ABNORMAL LOW (ref 150–400)
RBC: 2.13 MIL/uL — ABNORMAL LOW (ref 3.87–5.11)
RDW: 22.9 % — ABNORMAL HIGH (ref 11.5–15.5)
WBC: 2.4 K/uL — ABNORMAL LOW (ref 4.0–10.5)
nRBC: 0.8 % — ABNORMAL HIGH (ref 0.0–0.2)

## 2024-08-03 LAB — HEMOGLOBIN AND HEMATOCRIT, BLOOD
HCT: 29 % — ABNORMAL LOW (ref 36.0–46.0)
Hemoglobin: 8.9 g/dL — ABNORMAL LOW (ref 12.0–15.0)

## 2024-08-03 LAB — PREPARE RBC (CROSSMATCH)

## 2024-08-03 MED ORDER — HEPARIN SOD (PORK) LOCK FLUSH 100 UNIT/ML IV SOLN
INTRAVENOUS | Status: AC
  Filled 2024-08-03: qty 5

## 2024-08-03 MED ORDER — SODIUM CHLORIDE 0.9% IV SOLUTION: Freq: Once | INTRAVENOUS | Status: AC

## 2024-08-03 MED ORDER — ACETAMINOPHEN 500 MG PO TABS
1000.0000 mg | ORAL_TABLET | Freq: Once | ORAL | Status: AC
  Administered 2024-08-03: 1000 mg via ORAL
  Filled 2024-08-03: qty 2

## 2024-08-03 MED ORDER — DIPHENHYDRAMINE HCL 25 MG PO CAPS
25.0000 mg | ORAL_CAPSULE | Freq: Once | ORAL | Status: AC
  Administered 2024-08-03: 25 mg via ORAL
  Filled 2024-08-03: qty 1

## 2024-08-03 MED ORDER — MORPHINE SULFATE (PF) 2 MG/ML IV SOLN
2.0000 mg | INTRAVENOUS | Status: DC | PRN
  Administered 2024-08-03: 2 mg via INTRAVENOUS
  Filled 2024-08-03: qty 1

## 2024-08-03 NOTE — Plan of Care (Signed)

## 2024-08-03 NOTE — Consult Note (Signed)
 [facility] Consultation Note  Referring Provider: [facility] Primary Care Physician:  [name], MD Primary Gastroenterologist:  Sampson  Reason for Consultation:  Obstructive jaundice  HPI: Jessica Simpson is a 53 y.o. female metastatic breast cancer currently on treatment (but now on hold due to jaundice) seen because of elevated liver enzymes.  She has had some mild right-sided discomfort.  No fevers or vomiting.  Imaging showed new onset biliary ductal dilatation felt to be from extrinsic compression from porta hepatis adenopathy.  No blood in stool or hematemesis.   Past Medical History:  Diagnosis Date   BRCA1 negative 10/22/2011   BRCA2 negative 10/22/2011   Breast cancer, IDC, Left, Stage III, Triple negative 06/30/2011   Breast disorder    Contraceptive education 01/15/2016   Cough    Dyspnea    Family history of breast cancer    Fracture    right 4th toe   History of breast cancer 07/22/2014   History of radiation therapy 05/16/18- 06/22/18   Right Breast/ 50.4 Gy in 28 fractions. Right posterior axilla and SCV nodes/ 50.4 Gy in 28 fractions.    History of radiation therapy    04/16/24 - 04/30/24 Dr. 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 She then received a boost to the left lumpectomy of 10 gray in 5 fractions. This was given at [facility] .   Sleep apnea    Status post chemotherapy Comp. 11/23/11    FEC and Taxotere     Past Surgical History:  Procedure Laterality Date   anal ascess     turned into a fistula with extensive treatment   AXILLARY LYMPH NODE BIOPSY Right 04/06/2018   AXILLARY LYMPH NODE DISSECTION Right 04/06/2018   Procedure: RIGHT AXILLARY LYMPH NODE DISSECTION;  Surgeon: Ebbie Cough, MD;  Location: [facility] OR;  Service: General;  Laterality: Right;   BREAST BIOPSY Left 2012   BREAST BIOPSY Right 2019   BREAST LUMPECTOMY Left 2012   BREAST LUMPECTOMY Right 2019   rt axilla   BREAST SURGERY     CARDIAC ELECTROPHYSIOLOGY MAPPING AND ABLATION     CESAREAN SECTION     COLONOSCOPY N/A 08/14/2014   Procedure: COLONOSCOPY;  Surgeon: Claudis RAYMOND Rivet, MD;  Location: [facility];  Service: Endoscopy;  Laterality: N/A;  930   EVACUATION BREAST HEMATOMA  12/21/2011   Procedure: EVACUATION HEMATOMA BREAST;  Surgeon: Jina Nephew, MD;  Location: [facility] OR;  Service: General;  Laterality: Left;   HAND SURGERY     tumor on finger, left hand   INCISION AND DRAINAGE ABSCESS ANAL     x3   IR IMAGING GUIDED PORT INSERTION  08/01/2023   left breast biopsy with axillary biopsy     core bx   PORT-A-CATH REMOVAL  12/21/2011   Procedure: REMOVAL PORT-A-CATH;  Surgeon: Sherlean JINNY Laughter, MD;  Location: [facility];  Service: General;  Laterality: N/A;   PORTACATH  PLACEMENT  08/02/2011   dr Merrilyn   PORTACATH PLACEMENT Right 11/14/2017   Procedure: INSERTION PORT-A-CATH WITH US ;  Surgeon: Ebbie Cough, MD;  Location: [facility] OR;  Service: General;  Laterality: Right;   removal breast mass  2004   benign   TREATMENT FISTULA ANAL     TUMOR REMOVAL     on left hand   TUMOR REMOVAL     rt. eye    Prior to Admission medications   Medication Sig Start Date End Date Taking? Authorizing Provider  albuterol  (ACCUNEB ) 0.63 MG/3ML nebulizer solution Take 3 mLs (0.63 mg total) by nebulization every 6 (six) hours as needed for wheezing. 12/06/23  Yes Odean Potts, MD   amLODipine  (NORVASC ) 5 MG tablet Take 10 mg by mouth daily. 01/05/16  Yes [provider]  Cyanocobalamin  (VITAMIN B-12) 5000 MCG TBDP Take 5,000 mcg by mouth daily.   Yes [provider]  ibuprofen  (ADVIL ) 800 MG tablet TAKE 1 TABLET BY MOUTH EVERY 8 HOURS AS NEEDED Patient taking differently: Take 800 mg by mouth every 8 (eight) hours as needed for mild pain (pain score 1-3) (or headaches). 05/21/24  Yes Gudena, Vinay, MD  lidocaine -prilocaine  (EMLA ) cream Apply 1 Application topically as needed (as directed).   Yes [provider]  loratadine  (CLARITIN ) 10 MG tablet Take 1 tablet (10 mg total) by mouth daily as needed for allergies. 02/07/24  Yes Odean Potts, MD  LORazepam  (ATIVAN ) 1 MG tablet Take 1 tablet (1 mg total) by mouth every 8 (eight) hours. Patient taking differently: Take 1 mg by mouth every 8 (eight) hours as needed for anxiety. 01/17/24  Yes Gudena, Vinay, MD  magnesium  oxide (MAG-OX) 400 MG tablet TAKE 1 TABLET BY MOUTH EVERY DAY Patient taking differently: Take 400 mg by mouth in the morning. 06/17/24  Yes Causey, Lindsey Cornetto, NP  metoprolol  succinate (TOPROL  XL) 25 MG 24 hr tablet Take 1 tablet (25 mg total) by mouth daily. 09/20/23  Yes Waddell Danelle ORN, MD  ondansetron  (ZOFRAN ) 8 MG tablet Take 1 tablet (8 mg total) by mouth 2 (two) times daily. Patient taking differently: Take 8 mg by mouth 2 (two) times daily as needed for nausea. 11/02/23  Yes Gudena, Vinay, MD  oxyCODONE  (OXY IR/ROXICODONE ) 5 MG immediate release tablet Take 1 tablet (5 mg total) by mouth every 4 (four) hours as needed for severe pain (pain score 7-10). 07/31/24  Yes Causey, Lindsey Cornetto, NP  sulfamethoxazole -trimethoprim (BACTRIM DS) 800-160 MG tablet Take 1 tablet by mouth 3 (three) times a week. 07/27/24  Yes Causey, Morna Pickle, NP  Vitamin D , Ergocalciferol , (DRISDOL ) 1.25 MG (50000 UNIT) CAPS capsule Take 1 capsule (50,000 Units total) by mouth once a week. Patient  taking differently: Take 50,000 Units by mouth every Monday. 10/14/23  Yes Causey, Morna Pickle, NP  diphenoxylate -atropine  (LOMOTIL ) 2.5-0.025 MG tablet Take 1-2 tablets by mouth 4 (four) times daily as needed for diarrhea or loose stools. Patient not taking: Reported on 08/02/2024 12/27/23   Lanny Callander, MD  metroNIDAZOLE  (METROGEL ) 0.75 % gel Apply 1 Application topically 2 (two) times daily. Patient not taking: Reported on 08/02/2024 07/13/24   Crawford Morna Pickle, NP    Current Facility-Administered Medications  Medication Dose Route Frequency Provider Last Rate Last Admin   0.9 %  sodium chloride  infusion (Manually program via Guardrails IV Fluids)   Intravenous Once Andrez Chroman, NP       0.9 %  sodium chloride  infusion   Intravenous Continuous Christobal Guadalajara, MD  40 mL/hr at 08/02/24 1941 New Bag at 08/02/24 1941   albuterol  (PROVENTIL ) (2.5 MG/3ML) 0.083% nebulizer solution 3 mL  3 mL Nebulization Q6H PRN Kc, Mennie, MD       amLODipine  (NORVASC ) tablet 10 mg  10 mg Oral Daily Kc, Ramesh, MD   10 mg at 08/03/24 9178   Chlorhexidine  Gluconate Cloth 2 % PADS 6 each  6 each Topical Daily Kc, Mennie, MD       cyanocobalamin  (VITAMIN B12) tablet 5,000 mcg  5,000 mcg Oral Daily Kc, Ramesh, MD   5,000 mcg at 08/03/24 9178   diphenoxylate -atropine  (LOMOTIL ) 2.5-0.025 MG per tablet 1-2 tablet  1-2 tablet Oral QID PRN Christobal Mennie, MD       feeding supplement (ENSURE PLUS HIGH PROTEIN) liquid 237 mL  237 mL Oral BID BM Kc, Ramesh, MD       loratadine  (CLARITIN ) tablet 10 mg  10 mg Oral Daily PRN Kc, Mennie, MD       LORazepam  (ATIVAN ) tablet 1 mg  1 mg Oral TID PRN Christobal Mennie, MD       magnesium  oxide (MAG-OX) tablet 400 mg  400 mg Oral Daily Kc, Ramesh, MD   400 mg at 08/03/24 9178   metoprolol  succinate (TOPROL -XL) 24 hr tablet 25 mg  25 mg Oral Daily Kc, Mennie, MD   25 mg at 08/03/24 9178   morphine  (PF) 2 MG/ML injection 2 mg  2 mg Intravenous Q3H PRN Chavez, Abigail, NP   2 mg at 08/03/24  0225   ondansetron  (ZOFRAN ) tablet 4 mg  4 mg Oral Q6H PRN Christobal Mennie, MD       Or   ondansetron  (ZOFRAN ) injection 4 mg  4 mg Intravenous Q6H PRN Kc, Ramesh, MD   4 mg at 08/03/24 0224   oxyCODONE  (Oxy IR/ROXICODONE ) immediate release tablet 5 mg  5 mg Oral Q4H PRN Christobal Mennie, MD   5 mg at 08/02/24 2221   polyethylene glycol (MIRALAX / GLYCOLAX) packet 17 g  17 g Oral Daily PRN Kc, Mennie, MD       potassium chloride  (KLOR-CON ) CR tablet 40 mEq  40 mEq Oral Once Kc, Mennie, MD       sodium chloride  flush (NS) 0.9 % injection 10-40 mL  10-40 mL Intracatheter Q12H Kc, Ramesh, MD   10 mL at 08/02/24 2254   sodium chloride  flush (NS) 0.9 % injection 10-40 mL  10-40 mL Intracatheter PRN Christobal Mennie, MD       sulfamethoxazole -trimethoprim (BACTRIM DS) 800-160 MG per tablet 1 tablet  1 tablet Oral Once per day on Monday Wednesday Friday Christobal Mennie, MD   1 tablet at 08/03/24 9178    Allergies as of 08/02/2024 - Review Complete 08/02/2024  Allergen Reaction Noted   Codeine Nausea And Vomiting 06/30/2011   Hydrocodone-acetaminophen  Itching, Nausea And Vomiting, and Other (See Comments) 11/03/2017    Family History  Problem Relation Age of Onset   Cancer Father 32       lung cancer    Cancer Maternal Grandmother        breast   Cancer Maternal Grandfather    Breast cancer Other        MGMs mother   Colon cancer Neg Hx     Social History   Socioeconomic History   Marital status: Single    Spouse name: Not on file   Number of children: Not on file   Years of education: Not on file   Highest education  level: Not on file  Occupational History   Not on file  Tobacco Use   Smoking status: Never   Smokeless tobacco: Never  Vaping Use   Vaping status: Never Used  Substance and Sexual Activity   Alcohol use: No   Drug use: No   Sexual activity: Yes    Birth control/protection: Post-menopausal  Other Topics Concern   Not on file  Social History Narrative   Not on file   Social  Drivers of Health   Financial Resource Strain: Low Risk  (01/26/2022)   Overall Financial Resource Strain (CARDIA)    Difficulty of Paying Living Expenses: Not very hard  Food Insecurity: No Food Insecurity (08/02/2024)   Hunger Vital Sign    Worried About Running Out of Food in the Last Year: Never true    Ran Out of Food in the Last Year: Never true  Transportation Needs: No Transportation Needs (08/02/2024)   PRAPARE - Administrator, Civil Service (Medical): No    Lack of Transportation (Non-Medical): No  Physical Activity: Insufficiently Active (01/26/2022)   Exercise Vital Sign    Days of Exercise per Week: 3 days    Minutes of Exercise per Session: 30 min  Stress: No Stress Concern Present (01/26/2022)   [employer] of Occupational Health - Occupational Stress Questionnaire    Feeling of Stress : Only a little  Social Connections: Moderately Integrated (01/26/2022)   Social Connection and Isolation Panel    Frequency of Communication with Friends and Family: More than three times a week    Frequency of Social Gatherings with Friends and Family: Once a week    Attends Religious Services: More than 4 times per year    Active Member of [employer] or Organizations: Yes    Attends Engineer, structural: More than 4 times per year    Marital Status: Never married  Intimate Partner Violence: Not At Risk (08/02/2024)   Humiliation, Afraid, Rape, and Kick questionnaire    Fear of Current or Ex-Partner: No    Emotionally Abused: No    Physically Abused: No    Sexually Abused: No    Review of Systems: As per HPI, all others negative  Physical Exam: Vital signs in last 24 hours: Temp:  [98.3 F (36.8 C)-99.1 F (37.3 C)] 99 F (37.2 C) (10/10 1116) Pulse Rate:  [96-108] 96 (10/10 1116) Resp:  [18-20] 20 (10/10 1116) BP: (123-148)/(69-77) 148/72 (10/10 1116) SpO2:  [92 %-99 %] 94 % (10/10 1116) Weight:  [92.5 kg] 92.5 kg (10/09 1706)   General:   Alert,   Well-developed, well-nourished, pleasant and cooperative in NAD Head:  Normocephalic and atraumatic. Eyes:  Sclera icteric  Conjunctiva pale Ears:  Normal auditory acuity. Nose:  No deformity, discharge,  or lesions. Mouth:  No deformity or lesions.  Oropharynx pink & moist. Neck:  Supple; no masses or thyromegaly. Lungs:  Tachypnea with slight exertion Abdomen:  Soft, indurated skin abdominal wall (metastases?), mild upper abdominal tenderness without peritonitis  Msk:  Symmetrical without gross deformities. Normal posture. Pulses:  Normal pulses noted. Extremities:  Without clubbing or edema. Neurologic:  Alert and  oriented x4;  grossly normal neurologically. Skin:  Intact without significant lesions or rashes. Psych:  Alert and cooperative. Normal mood and affect.   Lab Results: Recent Labs    08/02/24 1141 08/03/24 0419 08/03/24 1106  WBC 2.2* 2.4*  --   HGB 7.7* 6.6* 8.9*  HCT 23.5* 22.0* 29.0*  PLT 130*  114*  --    BMET Recent Labs    08/02/24 1141 08/03/24 0419  NA 137 136  K 3.3* 3.9  CL 101 102  CO2 27 24  GLUCOSE 130* 100*  BUN 13 12  CREATININE 0.98 1.01*  CALCIUM 9.5 9.2   LFT Recent Labs    08/03/24 0419  PROT 6.2*  ALBUMIN 3.3*  AST 103*  ALT 160*  ALKPHOS 700*  BILITOT 5.3*   PT/INR No results for input(s): LABPROT, INR in the last 72 hours.  Studies/Results: MR ABDOMEN MRCP WO CONTRAST Result Date: 08/01/2024 EXAM: MRCP WITHOUT IV CONTRAST 08/01/2024 02:55:36 PM TECHNIQUE: Multisequence, multiplanar magnetic resonance images of the abdomen without intravenous contrast. Limited imaging performed only T2 weighted imaging. Patient refused further imaging. MRCP sequences were performed. COMPARISON: PET CT 07/26/2024. CLINICAL HISTORY: Jaundice; hyperbilirubinemia. FINDINGS: LIVER: There is significant intrahepatic ductal dilatation in the left and right hepatic lobe. There is no focal lesion within the liver itself. GALLBLADDER AND BILIARY  SYSTEM: Large gallstone within the lumen of the gallbladder measures 2 cm on image 22/4. The gallbladder is minimally distended at 4 cm without evidence of inflammation. The common bile duct is mildly dilated at 6 mm. There is abrupt tapering of the common bile duct as it approaches the pancreas on image 17/3. SPLEEN: Unremarkable. PANCREAS/PANCREATIC DUCT: Visualized pancreas is unremarkable. There is no pancreatic ductal dilatation. ADRENAL GLANDS: Unremarkable. KIDNEYS: Unremarkable. LYMPH NODES: Multiple enlarged lymph nodes in the porta hepatis appear to be the etiology of the common bile duct obstruction. An example node measures 3.3 cm on image 25 series 4 near the level of the biliary obstruction. Nodes are present dorsally and ventrally to the common bile duct and adjacent to the pancreatic head. VASCULATURE: Unremarkable. PERITONEUM: No ascites. ABDOMINAL WALL: No hernia. No mass. BOWEL: Grossly unremarkable. No bowel obstruction. BONES: No acute abnormality or worrisome osseous lesion. SOFT TISSUES: Unremarkable. PLEURAL SPACES: Small right pleural effusion. BREASTS: Bilateral breast abnormalities correspond to hypermetallic lesions on comparison PET CT scan. MISCELLANEOUS: Unremarkable. IMPRESSION: 1. Marked intrahepatic biliary ductal dilatation and mildly dilated common bile duct with abrupt tapering near the pancreatic head, most consistent with extrinsic biliary obstruction from multiple enlarged porta hepatis lymph nodes. 2. Large gallstone (2 cm) within a minimally distended gallbladder without evidence of cholecystitis. 3. Small right pleural effusion. 4. Limited exam as patient refused further imaging. only 3 sequences performed . Electronically signed by: Norleen Boxer MD 08/01/2024 03:59 PM EDT RP Workstation: [id number]    Impression:   Metastatic breast cancer. New onset obstructive jaundice, likely extrinsic compression from porta hepatis adenopathy. Pancytopenia without overt GI  bleeding.  Plan:   Patient needs ERCP.  She does not have cholangitis and is not acutely ill from a biliary tract perspective.  She is short-of-breath likely from her anemia. I have discussed with Dr. Abran, who is covering ERCP procedures through the weekend.  Unfortunately, there are several other similar patients in hospital needing ERCP and it is likely that she won't be able to have ERCP until early next week. As such, patient can eat today.  I will return to see patient later today and present options (wait in hospital until she can have procedure versus expedited outpatient procedure versus tertiary center referral for ERCP), but either choice will likely result in same time-frame for procedure unless she develops symptoms of cholangitis (which she currently does not have). Case reviewed with Dr. Davia.   LOS: 1 day   Jessica Simpson  08/03/2024, 12:24 PM  Cell [phone] If no answer or after 5 PM call [phone]

## 2024-08-03 NOTE — Progress Notes (Signed)
 [facility]                                                                              Jessica Simpson, is a 53 y.o. female, DOB - Dec 29, 1970, FMW:[id number] Admit date - 08/02/2024    Outpatient Primary MD for the patient is [name], MD  LOS - 1  days  No chief complaint on file.      Brief summary   Patient is a 53 year old female with a history of metastatic breast CA, initially diagnosed in 2012, followed by oncology with clinically worsening of metastasis, elevated bilirubin.  Patient was seen at the oncology center on 10/9 on the day of admission.  She had an outpatient MRCP which showed marked intrahepatic biliary duct dilation and mildly dilated CBD with abrupt tapering near the pancreatic head most consistent with extrinsic biliary obstruction from multiple enlarged porta hepatis lymph nodes, large gallstone 2 cm with then a minimally distended gallbladder without cholecystitis. Patient has a fungating breast tumor with dressings in place, cancer related pain, has decreased appetite, mild constipation and stable shortness of breath.  Patient was admitted for possible GI intervention, ERCP with stent.  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 Burnette consulted however no plans for ERCP with stent today due to limited availability of biliary GI  -Will start her back on diet and Dr. Curtistine discussed with the patient how to proceed inpatient versus outpatient procedure   Hypertension: -Continue metoprolol , Norvasc      Anxiety: Continue  home Ativan  as needed   Metastatic breast cancer with fungating mass Cancer related pain with skin mets. -  On chemo last month and currently hold. She has had XRT as well. - On Bactrim 3 times a week, continue     Anemia of malignancy, pancytopenia - Pancytopenia in the setting of patient's malignancy,  - Heme-onc when 6.6, transfused 1 unit packed RBCs, hemoglobin improved to 8.9  - Oncology aware of admission    Hypokalemia: Replace as needed   OSA: Not using CPAP for a while due to cough   Obesity class I Estimated body mass index is 32.93 kg/m as calculated from the following:   Height as of this encounter: 5' 6 (1.676 m).   Weight as of this encounter: 92.5 kg.  Code Status: Full code DVT Prophylaxis:  SCDs Start: 08/02/24 1714   Level of Care: Level of care: Med-Surg Family Communication: Updated patient's fianc at the bedside Disposition Plan:      Remains inpatient appropriate:      Procedures:    Consultants:   Gastroenterology Oncology  Antimicrobials:   Anti-infectives (From admission, onward)    Start     Dose/Rate Route Frequency Ordered Stop   08/03/24 1000  sulfamethoxazole -trimethoprim (BACTRIM DS) 800-160 MG per tablet 1 tablet        1 tablet Oral Once per day on Monday Wednesday Friday 08/02/24 1743            Medications  sodium chloride    Intravenous Once   amLODipine   10 mg Oral Daily   Chlorhexidine  Gluconate Cloth  6 each Topical Daily   cyanocobalamin   5,000 mcg Oral Daily   feeding supplement  237 mL Oral BID BM   magnesium  oxide  400 mg Oral Daily   metoprolol  succinate  25 mg Oral Daily   potassium chloride   40 mEq Oral Once   sodium chloride  flush  10-40 mL Intracatheter Q12H   sulfamethoxazole -trimethoprim  1 tablet Oral Once per day on Monday Wednesday Friday      Subjective:   Jessica Simpson was seen and examined today.  No acute complaints, awaiting ERCP with stent.  Pain controlled.  No acute fever chills, nausea vomiting, chest pain.   Objective:   Vitals:   08/02/24 2156 08/03/24 0523 08/03/24 0810 08/03/24 1116  BP: 123/77 127/69  (!) 148/72  Pulse: (!) 102 (!) 102 (!) 106 96  Resp: 18 18 19 20   Temp: 99.1  F (37.3 C) 98.3 F (36.8 C) 98.4 F (36.9 C) 99 F (37.2 C)  TempSrc: Oral Oral Oral Oral  SpO2: 96% 92% 97% 94%  Weight:      Height:        Intake/Output Summary (Last 24 hours) at 08/03/2024 1213 Last data filed at 08/03/2024 0412 Gross per 24 hour  Intake 701.75 ml  Output --  Net 701.75 ml     Wt Readings from Last 3 Encounters:  08/02/24 92.5 kg  08/02/24 92.7 kg  07/31/24 92.5 kg     Exam General: Alert and oriented x 3, NAD Cardiovascular: S1 S2 auscultated,  RRR Respiratory: Clear to auscultation bilaterally, no wheezing Gastrointestinal: Soft, nontender, nondistended, + bowel sounds Ext: no pedal edema bilaterally Neuro: No new deficits Psych: Normal affect     Data Reviewed:  I have personally reviewed following labs    CBC Lab Results  Component Value Date   WBC 2.4 (L) 08/03/2024   RBC 2.13 (L) 08/03/2024   HGB 8.9 (L) 08/03/2024   HCT 29.0 (L) 08/03/2024   MCV 103.3 (H) 08/03/2024   MCH 31.0 08/03/2024   PLT 114 (L) 08/03/2024   MCHC 30.0 08/03/2024   RDW 22.9 (H) 08/03/2024   LYMPHSABS 0.3 (L) 08/02/2024   MONOABS 0.3 08/02/2024   EOSABS 0.0 08/02/2024   BASOSABS 0.0 08/02/2024     Last metabolic panel Lab Results  Component Value Date   NA 136 08/03/2024   K 3.9 08/03/2024   CL 102 08/03/2024   CO2 24 08/03/2024   BUN 12 08/03/2024   CREATININE 1.01 (H) 08/03/2024   GLUCOSE 100 (H) 08/03/2024   GFRNONAA >60 08/03/2024   GFRAA >60 12/21/2018   CALCIUM 9.2 08/03/2024   PHOS 3.5 07/31/2024   PROT 6.2 (L) 08/03/2024   ALBUMIN 3.3 (L) 08/03/2024   LABGLOB 3.2 07/26/2016   AGRATIO 1.3 07/26/2016   BILITOT 5.3 (H) 08/03/2024   ALKPHOS 700 (H) 08/03/2024   AST 103 (H) 08/03/2024   ALT 160 (H) 08/03/2024   ANIONGAP 10 08/03/2024    CBG (last 3)  No results for input(s): GLUCAP in the last 72 hours.    Coagulation Profile: No results for input(s): INR, PROTIME in the last 168 hours.   Radiology Studies: I have  personally reviewed the imaging studies  MR ABDOMEN MRCP WO CONTRAST Result Date: 08/01/2024 EXAM: MRCP WITHOUT IV CONTRAST 08/01/2024 02:55:36 PM TECHNIQUE: Multisequence, multiplanar magnetic resonance images of the abdomen without intravenous contrast. Limited imaging  performed only T2 weighted imaging. Patient refused further imaging. MRCP sequences were performed. COMPARISON: PET CT 07/26/2024. CLINICAL HISTORY: Jaundice; hyperbilirubinemia. FINDINGS: LIVER: There is significant intrahepatic ductal dilatation in the left and right hepatic lobe. There is no focal lesion within the liver itself. GALLBLADDER AND BILIARY SYSTEM: Large gallstone within the lumen of the gallbladder measures 2 cm on image 22/4. The gallbladder is minimally distended at 4 cm without evidence of inflammation. The common bile duct is mildly dilated at 6 mm. There is abrupt tapering of the common bile duct as it approaches the pancreas on image 17/3. SPLEEN: Unremarkable. PANCREAS/PANCREATIC DUCT: Visualized pancreas is unremarkable. There is no pancreatic ductal dilatation. ADRENAL GLANDS: Unremarkable. KIDNEYS: Unremarkable. LYMPH NODES: Multiple enlarged lymph nodes in the porta hepatis appear to be the etiology of the common bile duct obstruction. An example node measures 3.3 cm on image 25 series 4 near the level of the biliary obstruction. Nodes are present dorsally and ventrally to the common bile duct and adjacent to the pancreatic head. VASCULATURE: Unremarkable. PERITONEUM: No ascites. ABDOMINAL WALL: No hernia. No mass. BOWEL: Grossly unremarkable. No bowel obstruction. BONES: No acute abnormality or worrisome osseous lesion. SOFT TISSUES: Unremarkable. PLEURAL SPACES: Small right pleural effusion. BREASTS: Bilateral breast abnormalities correspond to hypermetallic lesions on comparison PET CT scan. MISCELLANEOUS: Unremarkable. IMPRESSION: 1. 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 2. Large gallstone (2 cm) within a minimally distended gallbladder without evidence of cholecystitis. 3. Small right pleural effusion. 4. Limited exam as patient refused further imaging. only 3 sequences performed . Electronically signed by: Norleen Boxer MD 08/01/2024 03:59 PM EDT RP Workstation: [id number]       Nydia Distance M.D. [facility] 08/03/2024, 12:13 PM  Available via Epic secure chat 7am-7pm After 7 pm, please refer to night coverage provider listed on amion.

## 2024-08-03 NOTE — Discharge Summary (Signed)
 Physician Discharge Summary   Patient: Jessica Simpson MRN: [id number] DOB: Apr 23, 1971  Admit date:     08/02/2024  Discharge date: 08/03/24  Discharge Physician: Nydia Distance, M,D    PCP: [name], MD   Recommendations at discharge:   Per GI, Dr. Burnette ERCP with possible stent will be planned outpatient on 08/07/2024.  Patient will be called by the GI office for further instructions.  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  Patient was seen at the oncology center on 10/9 on the day of admission.  She had an outpatient MRCP which showed marked intrahepatic biliary duct dilation and mildly dilated CBD with abrupt tapering near the pancreatic head most consistent with extrinsic biliary obstruction from multiple enlarged porta hepatis lymph nodes, large gallstone 2 cm with then a minimally distended gallbladder without cholecystitis. Patient has a fungating breast tumor with dressings in place, cancer related pain, has decreased appetite, mild constipation and stable shortness of breath.   Patient was admitted for possible GI intervention, ERCP with stent.    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 Burnette consulted however no plans for ERCP with stent today due to limited availability of biliary GI.  Dr.  Burnette discussed in detail with the patient and ERCP with possible stent will be planned on 08/07/2024, Tuesday.  GI office will call the patient for further instructions.  Cleared to discharge home.  Patient was recommended to return back if she developed any symptoms of cholangitis including abdominal pain, nausea vomiting, fevers.    Hypertension: -Continue metoprolol , Norvasc      Anxiety: Continue  home Ativan  as needed   Metastatic breast cancer with fungating mass Cancer related pain with skin mets. -  On chemo last month and currently hold. She has had XRT as well. - On Bactrim 3 times a week, continue    Anemia of malignancy, pancytopenia - Pancytopenia in the setting of patient's malignancy,  - Heme-onc when 6.6, transfused 1 unit packed RBCs, hemoglobin improved to 8.9      Hypokalemia: Replace as needed   OSA: Not using CPAP for a while due to cough   Obesity class I Estimated body mass index is 32.93 kg/m as calculated from the following:   Height as of this encounter: 5' 6 (1.676 m).   Weight as of this encounter: 92.5 kg.       Pain control - [place]  Controlled Substance Reporting System database was reviewed. and patient was instructed, not to drive, operate heavy machinery, perform activities at heights, swimming or participation in water  activities or provide baby-sitting services while on Pain, Sleep and Anxiety Medications; until their outpatient Physician has advised to do so again. Also recommended to not to take more than prescribed Pain, Sleep and Anxiety Medications.  Consultants: Gastroenterology, Dr. Burnette Procedures performed: None Disposition: Home Diet recommendation:  Discharge Diet Orders (From admission, onward)     Start  Ordered   08/03/24 0000  Diet - low sodium heart healthy        08/03/24 1429            DISCHARGE MEDICATION: Allergies as of 08/03/2024       Reactions   Codeine Nausea And Vomiting    Hydrocodone-acetaminophen  Itching, Nausea And Vomiting, Other (See Comments)   NO Lortab        Medication List     TAKE these medications    albuterol  0.63 MG/3ML nebulizer solution Commonly known as: ACCUNEB  Take 3 mLs (0.63 mg total) by nebulization every 6 (six) hours as needed for wheezing.   amLODipine  5 MG tablet Commonly known as: NORVASC  Take 10 mg by mouth daily.   diphenoxylate -atropine  2.5-0.025 MG tablet Commonly known as: LOMOTIL  Take 1-2 tablets by mouth 4 (four) times daily as needed for diarrhea or loose stools.   ibuprofen  800 MG tablet Commonly known as: ADVIL  TAKE 1 TABLET BY MOUTH EVERY 8 HOURS AS NEEDED What changed: reasons to take this   lidocaine -prilocaine  cream Commonly known as: EMLA  Apply 1 Application topically as needed (as directed).   loratadine  10 MG tablet Commonly known as: CLARITIN  Take 1 tablet (10 mg total) by mouth daily as needed for allergies.   LORazepam  1 MG tablet Commonly known as: ATIVAN  Take 1 tablet (1 mg total) by mouth every 8 (eight) hours. What changed:  when to take this reasons to take this   magnesium  oxide 400 MG tablet Commonly known as: MAG-OX TAKE 1 TABLET BY MOUTH EVERY DAY What changed: when to take this   metoprolol  succinate 25 MG 24 hr tablet Commonly known as: Toprol  XL Take 1 tablet (25 mg total) by mouth daily.   ondansetron  8 MG tablet Commonly known as: ZOFRAN  Take 1 tablet (8 mg total) by mouth 2 (two) times daily. What changed:  when to take this reasons to take this   oxyCODONE  5 MG immediate release tablet Commonly known as: Oxy IR/ROXICODONE  Take 1 tablet (5 mg total) by mouth every 4 (four) hours as needed for severe pain (pain score 7-10).   sulfamethoxazole -trimethoprim 800-160 MG tablet Commonly known as: BACTRIM DS Take 1 tablet by mouth 3 (three) times a week.   Vitamin B-12 5000 MCG Tbdp Take 5,000 mcg by mouth daily.   Vitamin D  (Ergocalciferol ) 1.25 MG (50000  UNIT) Caps capsule Commonly known as: DRISDOL  Take 1 capsule (50,000 Units total) by mouth once a week. What changed: when to take this               Discharge Care Instructions  (From admission, onward)           Start     Ordered   08/03/24 0000  Discharge wound care:       Comments: Wound care  Daily      Comments: Apply xeroform to the wound bed, top with ABD pad, secure with tape, however if limited use of tape can be considered, use mesh underwear, cut the seam and use like a breast binder/tube top to secure ABD and dressings in place.  May need to use two layers of mesh underwear.   08/03/24 1429            Follow-up Information     [name], MD. Schedule an appointment as soon as possible for a visit in 2 week(s).   Specialty: Family Medicine Why: for hospital follow-up Contact information: [address] [place] [id number] [phone]  Discharge Exam: Filed Weights   08/02/24 1706  Weight: 92.5 kg   S: No acute complaints cleared to discharge per GI as outpatient procedure planned  BP (!) 148/72 (BP Location: Left Leg)   Pulse 96   Temp 99 F (37.2 C) (Oral)   Resp 20   Ht 5' 6 (1.676 m)   Wt 92.5 kg   LMP 09/19/2018   SpO2 94%   BMI 32.93 kg/m   Physical Exam General: Alert and oriented x 3, NAD Cardiovascular: S1 S2 clear, RRR.  Respiratory: CTAB, no wheezing, Gastrointestinal: Soft, nontender, nondistended, NBS Ext: no pedal edema bilaterally Psych: Normal affect    Condition at discharge: fair  The results of significant diagnostics from this hospitalization (including imaging, microbiology, ancillary and laboratory) are listed below for reference.   Imaging Studies: MR ABDOMEN MRCP WO CONTRAST Result Date: 08/01/2024 EXAM: MRCP WITHOUT IV CONTRAST 08/01/2024 02:55:36 PM TECHNIQUE: Multisequence, multiplanar magnetic resonance images of the abdomen without intravenous contrast. Limited  imaging performed only T2 weighted imaging. Patient refused further imaging. MRCP sequences were performed. COMPARISON: PET CT 07/26/2024. CLINICAL HISTORY: Jaundice; hyperbilirubinemia. FINDINGS: LIVER: There is significant intrahepatic ductal dilatation in the left and right hepatic lobe. There is no focal lesion within the liver itself. GALLBLADDER AND BILIARY SYSTEM: Large gallstone within the lumen of the gallbladder measures 2 cm on image 22/4. The gallbladder is minimally distended at 4 cm without evidence of inflammation. The common bile duct is mildly dilated at 6 mm. There is abrupt tapering of the common bile duct as it approaches the pancreas on image 17/3. SPLEEN: Unremarkable. PANCREAS/PANCREATIC DUCT: Visualized pancreas is unremarkable. There is no pancreatic ductal dilatation. ADRENAL GLANDS: Unremarkable. KIDNEYS: Unremarkable. LYMPH NODES: Multiple enlarged lymph nodes in the porta hepatis appear to be the etiology of the common bile duct obstruction. An example node measures 3.3 cm on image 25 series 4 near the level of the biliary obstruction. Nodes are present dorsally and ventrally to the common bile duct and adjacent to the pancreatic head. VASCULATURE: Unremarkable. PERITONEUM: No ascites. ABDOMINAL WALL: No hernia. No mass. BOWEL: Grossly unremarkable. No bowel obstruction. BONES: No acute abnormality or worrisome osseous lesion. SOFT TISSUES: Unremarkable. PLEURAL SPACES: Small right pleural effusion. BREASTS: Bilateral breast abnormalities correspond to hypermetallic lesions on comparison PET CT scan. MISCELLANEOUS: Unremarkable. IMPRESSION: 1. Marked intrahepatic biliary ductal dilatation and mildly dilated common bile duct with abrupt tapering near the pancreatic head, most consistent with extrinsic biliary obstruction from multiple enlarged porta hepatis lymph nodes. 2. Large gallstone (2 cm) within a minimally distended gallbladder without evidence of cholecystitis. 3. Small right  pleural effusion. 4. Limited exam as patient refused further imaging. only 3 sequences performed . Electronically signed by: Norleen Boxer MD 08/01/2024 03:59 PM EDT RP Workstation: [id number]   NM PET Image Restag (PS) Skull Base To Thigh Result Date: 07/26/2024 EXAM: PET AND CT SKULL BASE TO MID THIGH 07/26/2024 12:31:47 PM TECHNIQUE: RADIOPHARMACEUTICAL: 10.13 mCi F-18 FDG Uptake time 60 minutes. Glucose level 96 mg/dl. PET imaging was acquired from the base of the skull to the mid thighs. Non-contrast enhanced computed tomography was obtained for attenuation correction and anatomic localization. COMPARISON: FDG PET scan 04/14/2024. CLINICAL HISTORY: Breast cancer, invasive, stage IV, assess treatment response. 10.13 mCi F18 FDG IV RAC @ 1045 / HJ; FBG = 96 mg/dl ; RESTAGING FOR CARCINOMA OF BREAST METASTATIC TO INTRATHORACIC LYMPH NODE ; Last chemo tx on 9/19 ; Final radiation tx 7/7 ; EOV FINDINGS: HEAD AND NECK:  No metabolically active cervical lymphadenopathy. CHEST: Intense metabolic activity within the left and right breast tissue which is not significantly changed from prior exam. For example, activity in the right breast with SUV max equal to 10.7 compares to SUV max equal to 14.8. There is extension to the skin surface on the right around the nipple with intense metabolically active skin thickening. Findings are not significantly changed from comparison PET CT scan. Intense metabolic activity within the medial musculature of the left chest wall on image 60 not changed from prior. There is intense mediastinal adenopathy similar to prior. For example, prevascular node on image 66 with SUV max of 10.3 compared to SUV max of 7.6. Increased nodal activity along the SVC within the mediastinum. Several right lung nodules without clear hypermetabolic activity. Small right pleural effusion is increased from prior. Left axillary lymph node measuring 3 cm on image 168 SUV max equal 10.9 is increased from 10 mm  SUV max equal 14. ABDOMEN AND PELVIS: Within the abdomen, there is a large nodal mass within the porta hepatis measuring 5.6 cm with SUV maximal 12.4. This is not a significant change from the nodal mass on the prior with SUV maximal 14.8. Multilevel Hypermetabolic para-aortic lymph nodes. There are bilateral iliac and inguinal adenopathy. The iliac adenopathy is increased. Bilateral Hypermetabolic inguinal adenopathy is slightly progressive in size. Physiologic activity within the gastrointestinal and genitourinary systems. BONES AND SOFT TISSUE: Within the skeleton there is multifocal intensely hypermetabolic activity throughout the entire marrow space. For example, activity in the central sacrum SUV max = 13.4 compared to SUV max = 10.5. No changes on the CT portion of the exam. Favored GCSF-type marrow response. No metabolically active aggressive osseous lesion. IMPRESSION: 1. Hypermetabolic disease involving bilateral breasts with extension to the right nipple/skin, overall similar to prior. 2. Intense mediastinal adenopathy with increased nodal activity along the SVC, overall similar to prior. 3. Large hypermetabolic porta hepatis nodal mass, not significantly changed from prior; additional hypermetabolic para-aortic lymphadenopathy. 4. Bilateral iliac and inguinal adenopathy with increased iliac lymphadenopathy and slight progression of inguinal lymphadenopathy. 5. Diffuse intense marrow hypermetabolism without CT correlate, favored G-CSF-related marrow response. 6. Small right pleural effusion, increased from prior. Electronically signed by: Norleen Boxer MD 07/26/2024 02:05 PM EDT RP Workstation: [id number]    Microbiology: Results for orders placed or performed in visit on 11/16/23  C difficile quick screen w PCR reflex     Status: None   Collection Time: 11/16/23 10:15 AM   Specimen: STOOL  Result Value Ref Range Status   C Diff antigen NEGATIVE NEGATIVE Final   C Diff toxin NEGATIVE NEGATIVE  Final   C Diff interpretation No C. difficile detected.  Final    Comment: Performed at [facility], [address], [place], [place] [address]  Gastrointestinal Panel by PCR , Stool     Status: None   Collection Time: 11/16/23 10:15 AM  Result Value Ref Range Status   Campylobacter species NOT DETECTED NOT DETECTED Final   Plesimonas shigelloides NOT DETECTED NOT DETECTED Final   Salmonella species NOT DETECTED NOT DETECTED Final   Yersinia enterocolitica NOT DETECTED NOT DETECTED Final   Vibrio species NOT DETECTED NOT DETECTED Final   Vibrio cholerae NOT DETECTED NOT DETECTED Final   Enteroaggregative E coli (EAEC) NOT DETECTED NOT DETECTED Final   Enteropathogenic E coli (EPEC) NOT DETECTED NOT DETECTED Final   Enterotoxigenic E coli (ETEC) NOT DETECTED NOT DETECTED Final   Shiga like toxin  producing E coli (STEC) NOT DETECTED NOT DETECTED Final   Shigella/Enteroinvasive E coli (EIEC) NOT DETECTED NOT DETECTED Final   Cryptosporidium NOT DETECTED NOT DETECTED Final   Cyclospora cayetanensis NOT DETECTED NOT DETECTED Final   Entamoeba histolytica NOT DETECTED NOT DETECTED Final   Giardia lamblia NOT DETECTED NOT DETECTED Final   Adenovirus F40/41 NOT DETECTED NOT DETECTED Final   Astrovirus NOT DETECTED NOT DETECTED Final   Norovirus GI/GII NOT DETECTED NOT DETECTED Final   Rotavirus A NOT DETECTED NOT DETECTED Final   Sapovirus (I, II, IV, and V) NOT DETECTED NOT DETECTED Final    Comment: Performed at [facility], [address] [place], [place] [address]   *Note: Due to a large number of results and/or encounters for the requested time period, some results have not been displayed. A complete set of results can be found in Results Review.    Labs: CBC: Recent Labs  Lab 07/31/24 0916 08/02/24 1141 08/03/24 0419 08/03/24 1106  WBC 2.2* 2.2* 2.4*  --   NEUTROABS 1.4* 1.4*  --   --   HGB 7.8* 7.7* 6.6* 8.9*  HCT 23.8* 23.5* 22.0* 29.0*  MCV  95.2 96.7 103.3*  --   PLT 116* 130* 114*  --    Basic Metabolic Panel: Recent Labs  Lab 07/31/24 0916 08/02/24 1141 08/03/24 0419  NA 136 137 136  K 3.2* 3.3* 3.9  CL 102 101 102  CO2 26 27 24   GLUCOSE 116* 130* 100*  BUN 13 13 12   CREATININE 0.87 0.98 1.01*  CALCIUM 9.3 9.5 9.2  MG 1.7 1.7  --   PHOS 3.5  --   --    Liver Function Tests: Recent Labs  Lab 07/31/24 0916 08/02/24 1141 08/03/24 0419  AST 92* 79* 103*  ALT 226* 152* 160*  ALKPHOS 746* 716* 700*  BILITOT 4.1*  4.7* 5.6* 5.3*  PROT 6.9 6.9 6.2*  ALBUMIN 3.6 3.5 3.3*   CBG: No results for input(s): GLUCAP in the last 168 hours.  Discharge time spent: greater than 30 minutes.  Signed: Nydia Distance, MD [facility] 08/03/2024

## 2024-08-03 NOTE — Care Management CC44 (Signed)
 Condition Code 44 Documentation Completed  Patient Details  Name: Jessica Simpson MRN: [id number] Date of Birth: February 02, 1971   Condition Code 44 given:  Yes Patient signature on Condition Code 44 notice:  Yes Documentation of 2 MD's agreement:  Yes Code 44 added to claim:  Yes    Tawni CHRISTELLA Eva, LCSW 08/03/2024, 3:21 PM

## 2024-08-03 NOTE — Plan of Care (Signed)

## 2024-08-03 NOTE — Progress Notes (Addendum)
 Critical Result from Lab  Hgb: 6.6  Provider: A. Andrez, NP notified

## 2024-08-03 NOTE — Consult Note (Signed)
 WOC Nurse Consult Note: Reason for Consult: Prior breast wound  Metastatic breast cancer;wound present for a while, worsening with increased pain.  Wound type: cancerous lesion right breast Pressure Injury POA:NA Measurement: see nursing flow sheets Wound azi:wpeeoz is black, necrotic, increase yellow necrotic tissue surrounding the nipple circumferentially extending >2cm  Drainage (amount, consistency, odor) see nursing flow sheet Periwound:painful  Dressing procedure/placement/frequency: Debridement would not be recommended in this type of fungating wound with underlying diagnosis.  Atraumatic dressings would be first choice, not clear if patients tumor has odor.   Apply xeroform to the wound bed, top with ABD pad, secure with tape, however if limited use of tape can be considered, use mesh underwear, cut the seam and use like a breast binder/tube top to secure ABD and dressings in place.  May need to use two layers of mesh underwear.   Consider palliative consult for pain management.   Reached out to oncology NP to discuss other mechanisms of pain management    Re consult if needed, will not follow at this time. Thanks  Addyson Traub [employer], RN,CWOCN, CNS, [employer] [phone]

## 2024-08-04 ENCOUNTER — Ambulatory Visit

## 2024-08-04 ENCOUNTER — Inpatient Hospital Stay

## 2024-08-04 LAB — BPAM RBC
Blood Product Expiration Date: 202511062359
ISSUE DATE / TIME: 202510100801
Unit Type and Rh: 5100

## 2024-08-04 LAB — TYPE AND SCREEN
ABO/RH(D): O POS
Antibody Screen: NEGATIVE
Unit division: 0

## 2024-08-04 LAB — HIV ANTIBODY (ROUTINE TESTING W REFLEX): HIV Screen 4th Generation wRfx: NONREACTIVE

## 2024-08-05 ENCOUNTER — Encounter: Payer: Self-pay | Admitting: Hematology and Oncology

## 2024-08-06 ENCOUNTER — Ambulatory Visit

## 2024-08-06 ENCOUNTER — Encounter (HOSPITAL_COMMUNITY): Payer: Self-pay | Admitting: Gastroenterology

## 2024-08-06 NOTE — Progress Notes (Signed)
 Attempted to obtain medical history for pre op call via telephone, unable to reach at this time. HIPAA compliant voicemail message left requesting return call to pre surgical testing department.

## 2024-08-07 ENCOUNTER — Inpatient Hospital Stay

## 2024-08-07 ENCOUNTER — Ambulatory Visit (HOSPITAL_COMMUNITY)
Admission: RE | Admit: 2024-08-07 | Discharge: 2024-08-07 | Disposition: A | Discharge status: Home / Self Care | Attending: Gastroenterology | Admitting: Gastroenterology

## 2024-08-07 ENCOUNTER — Ambulatory Visit (HOSPITAL_COMMUNITY): Admitting: Anesthesiology

## 2024-08-07 ENCOUNTER — Ambulatory Visit (HOSPITAL_COMMUNITY)

## 2024-08-07 ENCOUNTER — Other Ambulatory Visit: Payer: Self-pay

## 2024-08-07 ENCOUNTER — Encounter (HOSPITAL_COMMUNITY): Admission: RE | Disposition: A | Payer: Self-pay | Source: Home / Self Care | Attending: Gastroenterology

## 2024-08-07 ENCOUNTER — Inpatient Hospital Stay: Admitting: Adult Health

## 2024-08-07 DIAGNOSIS — R17 Unspecified jaundice: Secondary | ICD-10-CM | POA: Diagnosis not present

## 2024-08-07 DIAGNOSIS — I10 Essential (primary) hypertension: Secondary | ICD-10-CM

## 2024-08-07 DIAGNOSIS — R932 Abnormal findings on diagnostic imaging of liver and biliary tract: Secondary | ICD-10-CM | POA: Insufficient documentation

## 2024-08-07 DIAGNOSIS — G4730 Sleep apnea, unspecified: Secondary | ICD-10-CM | POA: Insufficient documentation

## 2024-08-07 DIAGNOSIS — K219 Gastro-esophageal reflux disease without esophagitis: Secondary | ICD-10-CM | POA: Insufficient documentation

## 2024-08-07 DIAGNOSIS — K831 Obstruction of bile duct: Secondary | ICD-10-CM | POA: Diagnosis present

## 2024-08-07 DIAGNOSIS — G4733 Obstructive sleep apnea (adult) (pediatric): Secondary | ICD-10-CM

## 2024-08-07 DIAGNOSIS — Z950 Presence of cardiac pacemaker: Secondary | ICD-10-CM | POA: Diagnosis not present

## 2024-08-07 SURGERY — ERCP, WITH INTERVENTION IF INDICATED
Anesthesia: General

## 2024-08-07 MED ORDER — SODIUM CHLORIDE 0.9 % IV SOLN
INTRAVENOUS | Status: DC | PRN
  Administered 2024-08-07: 40 mL

## 2024-08-07 MED ORDER — GLUCAGON HCL RDNA (DIAGNOSTIC) 1 MG IJ SOLR
INTRAMUSCULAR | Status: AC
  Filled 2024-08-07: qty 1

## 2024-08-07 MED ORDER — DEXMEDETOMIDINE HCL IN NACL 80 MCG/20ML IV SOLN
INTRAVENOUS | Status: DC | PRN
  Administered 2024-08-07: 8 ug via INTRAVENOUS
  Administered 2024-08-07: 12 ug via INTRAVENOUS

## 2024-08-07 MED ORDER — FENTANYL CITRATE (PF) 100 MCG/2ML IJ SOLN
INTRAMUSCULAR | Status: AC
  Filled 2024-08-07: qty 2

## 2024-08-07 MED ORDER — ROCURONIUM BROMIDE 10 MG/ML (PF) SYRINGE
PREFILLED_SYRINGE | INTRAVENOUS | Status: DC | PRN
Start: 2024-08-07 — End: 2024-08-07
  Administered 2024-08-07: 90 mg via INTRAVENOUS

## 2024-08-07 MED ORDER — DICLOFENAC SUPPOSITORY 100 MG
RECTAL | Status: DC | PRN
  Administered 2024-08-07: 100 mg via RECTAL

## 2024-08-07 MED ORDER — DICLOFENAC SUPPOSITORY 100 MG
RECTAL | Status: AC
  Filled 2024-08-07: qty 1

## 2024-08-07 MED ORDER — PROPOFOL 10 MG/ML IV BOLUS
INTRAVENOUS | Status: AC
  Filled 2024-08-07: qty 20

## 2024-08-07 MED ORDER — CIPROFLOXACIN IN D5W 400 MG/200ML IV SOLN
INTRAVENOUS | Status: DC | PRN
  Administered 2024-08-07: 400 mg via INTRAVENOUS

## 2024-08-07 MED ORDER — ONDANSETRON HCL 4 MG/2ML IJ SOLN
INTRAMUSCULAR | Status: DC | PRN
  Administered 2024-08-07: 4 mg via INTRAVENOUS

## 2024-08-07 MED ORDER — SUGAMMADEX SODIUM 200 MG/2ML IV SOLN
INTRAVENOUS | Status: DC | PRN
Start: 2024-08-07 — End: 2024-08-07
  Administered 2024-08-07: 400 mg via INTRAVENOUS

## 2024-08-07 MED ORDER — PROPOFOL 10 MG/ML IV BOLUS
INTRAVENOUS | Status: DC | PRN
  Administered 2024-08-07: 200 mg via INTRAVENOUS

## 2024-08-07 MED ORDER — LIDOCAINE 2% (20 MG/ML) 5 ML SYRINGE
INTRAMUSCULAR | Status: DC | PRN
  Administered 2024-08-07: 100 mg via INTRAVENOUS

## 2024-08-07 MED ORDER — CIPROFLOXACIN IN D5W 400 MG/200ML IV SOLN
INTRAVENOUS | Status: AC
  Filled 2024-08-07: qty 200

## 2024-08-07 MED ORDER — GLUCAGON HCL RDNA (DIAGNOSTIC) 1 MG IJ SOLR
INTRAMUSCULAR | Status: AC
Start: 2024-08-07 — End: 2024-08-07
  Filled 2024-08-07: qty 1

## 2024-08-07 MED ORDER — LACTATED RINGERS IV SOLN
INTRAVENOUS | Status: AC | PRN
  Administered 2024-08-07: 1000 mL via INTRAVENOUS

## 2024-08-07 MED ORDER — FENTANYL CITRATE (PF) 100 MCG/2ML IJ SOLN
INTRAMUSCULAR | Status: DC | PRN
  Administered 2024-08-07 (×3): 50 ug via INTRAVENOUS

## 2024-08-07 MED ORDER — PHENYLEPHRINE HCL (PRESSORS) 10 MG/ML IV SOLN
INTRAVENOUS | Status: DC | PRN
  Administered 2024-08-07 (×2): 120 ug via INTRAVENOUS
  Administered 2024-08-07 (×2): 160 ug via INTRAVENOUS
  Administered 2024-08-07 (×2): 120 ug via INTRAVENOUS

## 2024-08-07 MED ORDER — PROPOFOL 500 MG/50ML IV EMUL
INTRAVENOUS | Status: DC | PRN
Start: 2024-08-07 — End: 2024-08-07
  Administered 2024-08-07: 75 ug/kg/min via INTRAVENOUS

## 2024-08-07 NOTE — Anesthesia Postprocedure Evaluation (Signed)
 Anesthesia Post Note  Patient: Jessica Simpson  Procedure(s) Performed: ERCP, WITH INTERVENTION IF INDICATED     Patient location during evaluation: PACU Anesthesia Type: General Level of consciousness: awake and alert Pain management: pain level controlled Vital Signs Assessment: post-procedure vital signs reviewed and stable Respiratory status: spontaneous breathing, nonlabored ventilation, respiratory function stable and patient connected to nasal cannula oxygen Cardiovascular status: blood pressure returned to baseline and stable Postop Assessment: no apparent nausea or vomiting Anesthetic complications: no   No notable events documented.  Last Vitals:  Vitals:   08/07/24 1515 08/07/24 1530  BP: (!) 128/29 131/65  Pulse: 95 91  Resp: 19 20  Temp:    SpO2: 96% 92%    Last Pain:  Vitals:   08/07/24 1530  TempSrc:   PainSc: Asleep                 Rome Ade

## 2024-08-07 NOTE — H&P (Signed)
 Jessica Simpson is an 53 y.o. female.   Chief Complaint: Obstructive  jaundice HPI: Patient seen and examined and her hospital computer chart was reviewed and please see Dr. Dolan consult note last week for details but she is currently asymptomatic and we reviewed the procedure and answered all of her questions and her case was discussed with Dr. Burnette as well  Past Medical History:  Diagnosis Date   BRCA1 negative 10/22/2011   BRCA2 negative 10/22/2011   Breast cancer, IDC, Left, Stage III, Triple negative 06/30/2011   Breast disorder    Contraceptive education 01/15/2016   Cough    Dyspnea    Family history of breast cancer    Fracture    right 4th toe   History of breast cancer 07/22/2014   History of radiation therapy 05/16/18- 06/22/18   Right Breast/ 50.4 Gy in 28 fractions. Right posterior axilla and SCV nodes/ 50.4 Gy in 28 fractions.    History of radiation therapy    04/16/24 - 04/30/24 Dr. 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 She then received a boost to the left lumpectomy of 10 gray in 5 fractions. This was given at [facility] .   Sleep apnea    Status post chemotherapy Comp. 11/23/11   FEC and Taxotere     Past Surgical History:  Procedure Laterality Date   anal ascess     turned into a fistula with extensive treatment   AXILLARY LYMPH NODE BIOPSY Right 04/06/2018   AXILLARY LYMPH NODE DISSECTION Right 04/06/2018    Procedure: RIGHT AXILLARY LYMPH NODE DISSECTION;  Surgeon: Ebbie Cough, MD;  Location: [facility] OR;  Service: General;  Laterality: Right;   BREAST BIOPSY Left 2012   BREAST BIOPSY Right 2019   BREAST LUMPECTOMY Left 2012   BREAST LUMPECTOMY Right 2019   rt axilla   BREAST SURGERY     CARDIAC ELECTROPHYSIOLOGY MAPPING AND ABLATION     CESAREAN SECTION     COLONOSCOPY N/A 08/14/2014   Procedure: COLONOSCOPY;  Surgeon: Claudis RAYMOND Rivet, MD;  Location: [facility];  Service: Endoscopy;  Laterality: N/A;  930   EVACUATION BREAST HEMATOMA  12/21/2011   Procedure: EVACUATION HEMATOMA BREAST;  Surgeon: Jina Nephew, MD;  Location: [facility] OR;  Service: General;  Laterality: Left;   HAND SURGERY     tumor on finger, left hand   INCISION AND DRAINAGE ABSCESS ANAL     x3   IR IMAGING GUIDED PORT INSERTION  08/01/2023   left breast biopsy with axillary biopsy     core bx   PORT-A-CATH REMOVAL  12/21/2011   Procedure: REMOVAL PORT-A-CATH;  Surgeon: Sherlean JINNY Laughter, MD;  Location: [facility];  Service: General;  Laterality: N/A;   PORTACATH PLACEMENT  08/02/2011   dr Laughter PINON PLACEMENT Right 11/14/2017   Procedure: INSERTION PORT-A-CATH WITH US ;  Surgeon: Ebbie Cough, MD;  Location: [facility] OR;  Service: General;  Laterality: Right;   removal breast mass  2004   benign   TREATMENT FISTULA ANAL     TUMOR REMOVAL     on left hand   TUMOR REMOVAL     rt. eye    Family History  Problem Relation Age of Onset   Cancer Father 9       lung cancer    Cancer Maternal Grandmother        breast   Cancer Maternal Grandfather    Breast cancer Other        MGMs mother   Colon cancer Neg Hx    Social History:  reports that she has never smoked. She has never used smokeless tobacco. She reports that she does not drink alcohol and does not use drugs.  Allergies:  Allergies  Allergen Reactions   Codeine Nausea And Vomiting   Hydrocodone-Acetaminophen  Itching, Nausea And  Vomiting and Other (See Comments)    NO Lortab    Medications Prior to Admission  Medication Sig Dispense Refill   amLODipine  (NORVASC ) 5 MG tablet Take 10 mg by mouth daily.  6   Cyanocobalamin  (VITAMIN B-12) 5000 MCG TBDP Take 5,000 mcg by mouth daily.     loratadine  (CLARITIN ) 10 MG tablet Take 1 tablet (10 mg total) by mouth daily as needed for allergies. 90 tablet 2   LORazepam  (ATIVAN ) 1 MG tablet Take 1 tablet (1 mg total) by mouth every 8 (eight) hours. (Patient taking differently: Take 1 mg by mouth every 8 (eight) hours as needed for anxiety.) 10 tablet 0   magnesium  oxide (MAG-OX) 400 MG tablet TAKE 1 TABLET BY MOUTH EVERY DAY (Patient taking differently: Take 400 mg by mouth in the morning.) 30 tablet 1   metoprolol  succinate (TOPROL  XL) 25 MG 24 hr tablet Take 1 tablet (25 mg total) by mouth daily. 90 tablet 3   ondansetron  (ZOFRAN ) 8 MG tablet Take 1 tablet (8 mg total) by mouth 2 (two) times daily. (Patient taking differently: Take 8 mg by mouth 2 (two) times daily as needed for nausea.) 60 tablet 6   oxyCODONE  (OXY IR/ROXICODONE ) 5 MG immediate release tablet Take 1 tablet (5 mg total) by mouth every 4 (four) hours as needed for severe pain (pain score 7-10). 30 tablet 0   sulfamethoxazole -trimethoprim (BACTRIM DS) 800-160 MG tablet Take 1 tablet by mouth 3 (three) times a week. 15 tablet 0   Vitamin D , Ergocalciferol , (DRISDOL ) 1.25 MG (50000 UNIT) CAPS capsule Take 1 capsule (50,000 Units total) by mouth once a week. (Patient taking differently: Take 50,000 Units by mouth every Monday.) 12 capsule 2   albuterol  (ACCUNEB ) 0.63 MG/3ML nebulizer solution Take 3 mLs (0.63 mg total) by nebulization every 6 (six) hours as needed for wheezing. 75 mL 12   diphenoxylate -atropine  (LOMOTIL ) 2.5-0.025 MG tablet Take 1-2 tablets by mouth 4 (four) times daily as needed for diarrhea or loose stools. (Patient not taking: Reported on 08/02/2024) 60 tablet 0   ibuprofen  (ADVIL ) 800 MG tablet TAKE  1 TABLET BY MOUTH EVERY 8 HOURS AS NEEDED (Patient taking differently: Take 800 mg by mouth every 8 (eight) hours as needed for mild pain (pain score 1-3) (or headaches).) 30 tablet 0   lidocaine -prilocaine  (EMLA ) cream Apply 1 Application topically as needed (as directed).      No results found. However, due to the size of the patient record, not all encounters were searched. Please check Results Review for a complete set of results. No results found.  Review of Systems negative except above  Blood pressure (!) 157/87, temperature 98.5 F (36.9 C), temperature source Temporal, resp. rate (!) 21, height 5' 6 (1.676 m), weight 92.5 kg, last menstrual period 09/19/2018, SpO2 98%. Physical Exam vital signs stable afebrile no acute distress exam please see preassessment evaluation  Assessment/Plan Obstructive jaundice in patient with metastatic breast cancer Plan: Risk benefits methods and success rate of ERCP and hopefully stenting was discussed with the patient and we will proceed today with anesthesia assistance  [facility] E, MD 08/07/2024, 12:54 PM

## 2024-08-07 NOTE — Anesthesia Preprocedure Evaluation (Addendum)
 Anesthesia Evaluation  Patient identified by MRN, date of birth, ID band Patient awake    Reviewed: Allergy & Precautions, NPO status , Patient's Chart, lab work & pertinent test results  History of Anesthesia Complications Negative for: history of anesthetic complications  Airway Mallampati: I  TM Distance: >3 FB Neck ROM: Full    Dental  (+) Teeth Intact, Dental Advisory Given   Pulmonary sleep apnea    breath sounds clear to auscultation       Cardiovascular hypertension, + dysrhythmias + pacemaker  Rhythm:Regular Rate:Tachycardia  TTE (2024): 1. Left ventricular ejection fraction, by estimation, is 55%. The left  ventricle has normal function. The left ventricle has no regional wall  motion abnormalities. Left ventricular diastolic parameters are consistent  with Grade I diastolic dysfunction  (impaired relaxation).   2. Right ventricular systolic function is normal. The right ventricular  size is normal. Tricuspid regurgitation signal is inadequate for assessing  PA pressure.   3. The mitral valve is grossly normal. No evidence of mitral valve  regurgitation. No evidence of mitral stenosis.   4. The aortic valve was not well visualized. Aortic valve regurgitation  is not visualized. No aortic stenosis is present.     Neuro/Psych    GI/Hepatic ,GERD  Medicated,,  Endo/Other    Renal/GU      Musculoskeletal   Abdominal   Peds  Hematology   Anesthesia Other Findings   Reproductive/Obstetrics                              Anesthesia Physical Anesthesia Plan  ASA: 3  Anesthesia Plan: General   Post-op Pain Management:    Induction: Intravenous  PONV Risk Score and Plan: 1 and Propofol  infusion  Airway Management Planned: Oral ETT  Additional Equipment: None  Intra-op Plan:   Post-operative Plan: Extubation in OR  Informed Consent:      Dental advisory  given  Plan Discussed with: CRNA and Surgeon  Anesthesia Plan Comments: (Most recent PET imaging noted significant mediastinal lymphadenopathy with uptake around the SVC. On exam, bilateral UE swelling > LE which the patient attributed to recent PIV and NIBP placement during her admission 3 days prior. Patient laid flat during exam to assess any compressive symptoms or SOB. VSS throughout laying flat and examination/conversation. Plan for GETA. )         Anesthesia Quick Evaluation

## 2024-08-07 NOTE — Transfer of Care (Signed)
 Immediate Anesthesia Transfer of Care Note  Patient: Jessica Simpson  Procedure(s) Performed: ERCP, WITH INTERVENTION IF INDICATED  Patient Location: Endoscopy Unit  Anesthesia Type:General  Level of Consciousness: drowsy  Airway & Oxygen Therapy: Patient Spontanous Breathing  Post-op Assessment: Report given to RN and Post -op Vital signs reviewed and stable  Post vital signs: Reviewed and stable  Last Vitals:  Vitals Value Taken Time  BP    Temp    Pulse    Resp    SpO2      Last Pain:  Vitals:   08/07/24 1235  TempSrc: Temporal  PainSc: 5       Patients Stated Pain Goal: 5 (08/07/24 1235)  Complications: No notable events documented.

## 2024-08-07 NOTE — Op Note (Signed)
 [facility] Patient Name: Jessica Simpson Procedure Date: 08/07/2024 MRN: [id number] Attending MD: Oliva Boots , MD, [id number] Date of Birth: 28-Jul-1971 CSN: [id number] Age: 53 Admit Type: Outpatient Procedure:                ERCP Indications:              Abnormal MRCP, obstructive jaundice Providers:                Oliva Boots, MD, Darice BIRCH. Bascom, RN, Corky Czech,                            Technician Referring MD:             Oliva Boots, MD Medicines:                General Anesthesia Complications:            No immediate complications. Estimated Blood Loss:     Estimated blood loss: none. Procedure:                Pre-Anesthesia Assessment:                           - Prior to the procedure, a History and Physical                            was performed, and patient medications and                            allergies were reviewed. The patient's tolerance of                            previous anesthesia was also reviewed. The risks                            and benefits of the procedure and the sedation                            options and risks were discussed with the patient.                            All questions were answered, and informed consent                            was obtained. Prior Anticoagulants: The patient has                            taken no anticoagulant or antiplatelet agents. ASA                            Grade Assessment: III - A patient with severe                            systemic disease. After reviewing the risks and  benefits, the patient was deemed in satisfactory                            condition to undergo the procedure.                           After obtaining informed consent, the scope was                            passed under direct vision. Throughout the                            procedure, the patient's blood pressure, pulse, and                            oxygen saturations  were monitored continuously. The                            [id number]-[id number] ([id number]) Olympus duodenoscope was                            introduced through the mouth, and used to inject                            contrast into and used to cannulate the bile duct.                            The ERCP was technically difficult and complex due                            to challenging cannulation because of abnormal                            anatomy. Successful completion of the procedure was                            aided by performing the maneuvers documented                            (below) in this report. The patient tolerated the                            procedure well. Scope In: Scope Out: Findings:      The major papilla was normal. Unfortunately despite trying the regular       sphincterotome and the smaller sphincterotome we were unable to       cannulate either the CBD or the PD and when the wire was unable to       advance and we were in seemingly good position we proceeded with A short       biliary pre-cut sphincterotomy was made with a Hydratome sphincterotome       using ERBE electrocautery. There was no post-sphincterotomy bleeding.       And unfortunately the wire caused a false channel and with the continual  attempt at cannulation we could not cannulate either duct but the       landmarks were intact so we proceeded with a biliary sphincterotomy was       made with a needle knife using a freehand technique using ERBE       electrocautery. There was no post-sphincterotomy bleeding. Drops of bile       were initially seen and we were able to advance the wire deep into the       intrahepatic and dye was injected to confirm the position and then we       switched to the regular sphincterotome and increased the sphincterotomy       some in the usual fashion and to discover objects and confirm the       anatomy, the biliary tree was swept with an adjustable 9-12 mm balloon        starting at the bifurcation we used almost extensively the 9 mm balloon       and it passed readily through the patent sphincterotomy site. A minimal       amount of sludge was swept from the duct. A distal approximately 2 to 3       cm stricture was confirmed on occlusion cholangiogram and we placed one       10 Fr by 5 cm temporary plastic biliary stent with a single external       flap and a single internal flap was placed 4 cm into the common bile       duct. Bile flowed through the stent. The stent was in good position. The       introducer and wire and scope were removed and the patient tolerated the       procedure well and we did not think we had any pancreatic duct wire       advancement or injections and only thought the wire went into the false       channel and the patient tolerated the procedure well as above Impression:               - The major papilla appeared normal.                           - A precut biliary sphincterotomy was performed.                           - A needle-knife biliary sphincterotomy was                            performed and then the sphincterotomy site once                            cannulation was done was extended.                           - The biliary tree was swept and sludge was found.                           - One temporary plastic biliary stent was placed  into the common bile duct for the distal 2 to 3 cm                            stricture as above. Moderate Sedation:      Not Applicable - Patient had care per Anesthesia. Recommendation:           - Clear liquid diet today. May slowly advance                            tomorrow if doing well                           - Continue present medications.                           - Return to GI clinic in 3 months to discuss stent                            change.                           - Telephone GI clinic if symptomatic PRN.                            - Check liver enzymes (AST, ALT, alkaline                            phosphatase, bilirubin) In roughly 1 week per                            oncology. Procedure Code(s):        --- Professional ---                           [phone], Endoscopic retrograde                            cholangiopancreatography (ERCP); with placement of                            endoscopic stent into biliary or pancreatic duct,                            including pre- and post-dilation and guide wire                            passage, when performed, including sphincterotomy,                            when performed, each stent                           [id number], Endoscopic retrograde                            cholangiopancreatography (ERCP); with removal of  calculi/debris from biliary/pancreatic duct(s) Diagnosis Code(s):        --- Professional ---                           R17, Unspecified jaundice                           R93.2, Abnormal findings on diagnostic imaging of                            liver and biliary tract CPT copyright 2022 American [employer] Association. All rights reserved. The codes documented in this report are preliminary and upon coder review may  be revised to meet current compliance requirements. Oliva Boots, MD 08/07/2024 3:38:40 PM This report has been signed electronically. Number of Addenda: 0

## 2024-08-07 NOTE — Anesthesia Procedure Notes (Signed)
 Procedure Name: Intubation Date/Time: 08/07/2024 1:20 PM  Performed by: Augusta Daved SAILOR, CRNAPre-anesthesia Checklist: Patient identified, Emergency Drugs available, Suction available and Patient being monitored Patient Re-evaluated:Patient Re-evaluated prior to induction Oxygen Delivery Method: Circle System Utilized Preoxygenation: Pre-oxygenation with 100% oxygen Induction Type: IV induction Ventilation: Mask ventilation without difficulty Laryngoscope Size: Glidescope and 3 Grade View: Grade I Tube type: Oral Tube size: 7.0 mm Number of attempts: 1 Airway Equipment and Method: Stylet and Oral airway Placement Confirmation: ETT inserted through vocal cords under direct vision, positive ETCO2 and breath sounds checked- equal and bilateral Secured at: 20 (at the lip) cm Tube secured with: Tape Dental Injury: Teeth and Oropharynx as per pre-operative assessment

## 2024-08-07 NOTE — Discharge Instructions (Addendum)
 Call if question or problem otherwise clear liquids only the rest of today and if doing well in a.m. may slowly advance diet and if doing well follow-up in 3 months to discuss the timing of stent change  YOU HAD AN ENDOSCOPIC PROCEDURE TODAY: Refer to the procedure report and other information in the discharge instructions given to you for any specific questions about what was found during the examination. If this information does not answer your questions, please call the [facility] office at [phone] to clarify.   YOU SHOULD EXPECT: Some feelings of bloating in the abdomen. Passage of more gas than usual. Walking can help get rid of the air that was put into your GI tract during the procedure and reduce the bloating.  DIET: Your first meal following the procedure should be a light meal and then it is ok to progress to your normal diet. A half-sandwich or bowl of soup is an example of a good first meal. Heavy or fried foods are harder to digest and may make you feel nauseous or bloated. Drink plenty of fluids but you should avoid alcoholic beverages for 24 hours.  ACTIVITY: Your care partner should take you home directly after the procedure. You should plan to take it easy, moving slowly for the rest of the day. You can resume normal activity the day after the procedure however YOU SHOULD NOT DRIVE, use power tools, machinery or perform tasks that involve climbing or major physical exertion for 24 hours (because of the sedation medicines used during the test).   SYMPTOMS TO REPORT IMMEDIATELY: A gastroenterologist can be reached at any hour. Please call [phone]  for any of the following symptoms:   Following upper endoscopy (EGD, EUS, ERCP, esophageal dilation) Vomiting of blood or coffee ground material  New, significant abdominal pain  New, significant chest pain or pain under the shoulder blades  Painful or persistently difficult swallowing  New shortness of breath  Black, tarry-looking  or red, bloody stools  FOLLOW UP:  If any biopsies were taken you will be contacted by phone or by letter within the next 1-3 weeks. Call [phone]  if you have not heard about the biopsies in 3 weeks.  Please also call with any specific questions about appointments or follow up tests.

## 2024-08-08 ENCOUNTER — Encounter (HOSPITAL_COMMUNITY): Payer: Self-pay | Admitting: Gastroenterology

## 2024-08-11 ENCOUNTER — Encounter: Payer: Self-pay | Admitting: Hematology and Oncology

## 2024-08-11 ENCOUNTER — Other Ambulatory Visit: Payer: Self-pay | Admitting: Hematology and Oncology

## 2024-08-14 ENCOUNTER — Other Ambulatory Visit: Payer: Self-pay | Admitting: Adult Health

## 2024-08-14 ENCOUNTER — Other Ambulatory Visit

## 2024-08-14 ENCOUNTER — Ambulatory Visit: Admitting: Hematology and Oncology

## 2024-08-14 ENCOUNTER — Ambulatory Visit

## 2024-08-15 ENCOUNTER — Inpatient Hospital Stay (HOSPITAL_BASED_OUTPATIENT_CLINIC_OR_DEPARTMENT_OTHER): Admitting: Hematology and Oncology

## 2024-08-15 DIAGNOSIS — C771 Secondary and unspecified malignant neoplasm of intrathoracic lymph nodes: Secondary | ICD-10-CM

## 2024-08-15 DIAGNOSIS — N6331 Unspecified lump in axillary tail of the right breast: Secondary | ICD-10-CM | POA: Diagnosis not present

## 2024-08-15 DIAGNOSIS — C50919 Malignant neoplasm of unspecified site of unspecified female breast: Secondary | ICD-10-CM

## 2024-08-15 NOTE — Progress Notes (Addendum)
 HEMATOLOGY-ONCOLOGY TELEPHONE VISIT PROGRESS NOTE  I connected with our patient on 08/15/24 at 11:30 AM EDT by telephone and verified that I am speaking with the correct person using two identifiers.  I discussed the limitations, risks, security and privacy concerns of performing an evaluation and management service by telephone and the availability of in person appointments.  I also discussed with the patient that there may be a patient responsible charge related to this service. The patient expressed understanding and agreed to proceed.   History of Present Illness: Follow-up to discuss treatment plan  History of Present Illness Ms. Pricer's 53 year old with metastatic breast cancer who had progressed on Trodelvy  and Verzinio.  Recently she had obstructive jaundice from a compression of the biliary duct.  She underwent stent placement last week.  She reports to be feeling reasonably well.  Her main complaint is related to cough and fatigue and weakness.  She is however not interested in stopping treatment.    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)  06/30/2011 Initial Diagnosis   Breast cancer, IDC, Left, Stage III, Triple negative, 7.6 cm breast mass and palpable axillary mass   09/06/2011 Miscellaneous   BRCA 1 and 2: Negative   09/14/2011 - 11/23/2011 Neo-Adjuvant Chemotherapy   Dose dense FEC followed by dose dense Taxotere    12/21/2011 Surgery   Left lumpectomy: High-grade poorly differentiated IDC 1.7 cm with high-grade DCIS, margins negative, 3/7 lymph nodes positive, ER 0%, PR 0%, HER-2 negative ratio 1.33 T1CN1 stage IIb   12/31/2011 - 02/15/2012 Radiation Therapy   Radiation at [facility]   09/26/2017 Relapse/Recurrence   Left breast upper outer quadrant within the lumpectomy bed: Fibrosis no malignancy, right axillary lymph node biopsy metastatic high-grade carcinoma ER 0%, PR 0%, Ki-67 90%, HER-2 negative ratio 1.11 (similar to previous ductal  carcinoma)   11/14/2017 - 03/06/2018 Neo-Adjuvant Chemotherapy   Neo-Adjuvant chemotherapy with Gemzar  and carboplatin  days 1 and 8 every 3 weeks   01/10/2018 Genetic Testing   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  The Hereditary Gene Panel offered by [facility] includes sequencing and/or deletion duplication testing of the following 47 genes: APC, ATM, AXIN2, BARD1, BMPR1A, BRCA1, BRCA2, BRIP1, CDH1, CDK4, CDKN2A (p14ARF), CDKN2A (p16INK4a), CHEK2, CTNNA1, DICER1, EPCAM (Deletion/duplication testing only), GREM1 (promoter region deletion/duplication testing only), KIT, MEN1, MLH1, MSH2, MSH3, MSH6, MUTYH, NBN, NF1, NHTL1, PALB2, PDGFRA, PMS2, POLD1, POLE, PTEN, RAD50, RAD51C, RAD51D, SDHB, SDHC, SDHD, SMAD4, SMARCA4. STK11, TP53, TSC1, TSC2, and VHL.  The following genes were evaluated for sequence changes only: SDHA and HOXB13 c.251G>A variant only. The report date is January 10, 2018.    05/31/2023 Procedure   Mediastinal mass biopsy: Metastatic poorly differentiated carcinoma compatible with breast primary ER +60%, PR 0%, HER2 0, Ki-67 40%    06/02/2023 PET scan   PET/CT Anterior mediastinal mass is hypermetabolic and centrally necrotic, soft tissue lesions anterior to the heart, 10 mm pleural-based lesion, level 2 cervical nodes, hypermetabolism hepatic dome mottled FDG accumulation throughout the skeletal system    06/06/2023 - 07/14/2023 Anti-estrogen oral therapy   Antiestrogen therapy with Verzenio  and letrozole     07/07/2023 Relapse/Recurrence   Increasing mediastinal mass 11.6 cm (used to be 10.8 cm) effacing the heart and involving the sternum, additional mediastinal/pericardial lymph nodes increased right extra lymph node 1.5 cm, nodules 0.2 cm (used to be 0.8 cm), few new scattered lung nodules)   07/25/2023 - 07/13/2024 Chemotherapy   Patient is on Treatment  Plan : BREAST METASTATIC Sacituzumab govitecan -hziy (Trodelvy ) D1,8 q21d     Cancer of  axillary tail of right female breast (HCC)  04/06/2018 Surgery   Right axillary lymph node dissection: 1/11 node positive for metastatic high-grade carcinoma   04/25/2018 Initial Diagnosis   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)   04/25/2018 Cancer Staging   Staging form: Breast, AJCC 8th Edition - Clinical: Stage IIB (cT0, cN1, cM0, G3, ER-, PR-, HER2-) - Signed by Izell Domino, MD on 04/25/2018   05/16/2018 - 06/22/2018 Radiation Therapy   Adjuvant radiation therapy with Xeloda    07/11/2018 - 10/24/2018 Chemotherapy   Adjuvant chemotherapy with CMF     REVIEW OF SYSTEMS:   Constitutional: Denies fevers, chills or abnormal weight loss All other systems were reviewed with the patient and are negative. 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) 06/30/2011: Left breast cancer stage III triple negative, 7.6 cm 09/14/2011: Dose dense FEC followed by dose dense Taxotere  neoadjuvant chemo 12/21/2011: Left lumpectomy: High-grade poorly differentiated IDC 1.7 cm 3/7 nodes positive triple negative, XRT 09/26/2017: Relapse: Right axillary lymph node biopsy positive for metastatic high-grade carcinoma triple negative 11/14/2017: Neoadjuvant Gemzar  and carboplatin  05/31/2023: Relapse: Mediastinal mass biopsy: Metastatic poorly differentiated carcinoma ER 60%, HER2 negative 06/06/2023: Verzinio with letrozole  07/25/2023: Trodelvy  02/21/2024: Chest wall skin biopsy: Poorly differentiated carcinoma ER 0% PR 0% Ki-67 40%, HER2 0 07/25/2023: Trodelvy  05/22/2024: Trodelvy  with Keytruda  --------------------------------------------------------------------------------------------------------------------- Caris molecular testing: PD-L1 mutation: Added Keytruda  to Trodelvy  based on clinical trial ASCENT 04 (median PFS 11.2 months versus 7.8 months)  07/26/2024: PET/CT: Hypermetabolic disease bilateral breasts, intense mediastinal  adenopathy, enlarged porta hepatis nodal mass, bilateral inguinal iliac and inguinal adenopathy, diffuse bone marrow hypermetabolism  MRCP 08/01/2024: Marked intrahepatic biliary ductal dilatation and dilated common bile duct Stent placement 08/07/2024  Treatment plan: Switch treatment to eribulin Planned to do day 1 every 3 weeks (I do not believe she can tolerate the day 8 chemotherapy) We will perform labs tomorrow and see if her bilirubin has come down to decide on starting her treatment.      I discussed the assessment and treatment plan with the patient. The patient was provided an opportunity to ask questions and all were answered. The patient agreed with the plan and demonstrated an understanding of the instructions. The patient was advised to call back or seek an in-person evaluation if the symptoms worsen or if the condition fails to improve as anticipated.   I provided 30 minutes of non-face-to-face time during this encounter.  This includes time for charting and coordination of care   Naomi MARLA Chad, MD

## 2024-08-15 NOTE — Progress Notes (Signed)
 DISCONTINUE ON PATHWAY REGIMEN - Breast     A cycle is every 21 days:     Sacituzumab govitecan -hziy   **Always confirm dose/schedule in your pharmacy ordering system**  PRIOR TREATMENT: [id number]: Sacituzumab Govitecan  10 mg/kg D1, 8 q21 Days  START ON PATHWAY REGIMEN - Breast     A cycle is every 21 days:     Eribulin mesylate   **Always confirm dose/schedule in your pharmacy ordering system**  Patient Characteristics: Distant Metastases or Locoregional Recurrent Disease - Unresectable, M0 or Locally Advanced Unresectable Disease Progressing after Neoadjuvant and Local Therapies, M0, HER2 Negative/Ultralow/Low, ER Negative, Chemotherapy, HER2 Negative/Ultralow, Third  Line and Beyond, [employer] or Not a Candidate for Molecular Targeted Therapy Therapeutic Status: Distant Metastases HER2 Status: Negative (-) ER Status: Negative (-) PR Status: Negative (-) Therapy Approach Indicated: Standard Chemotherapy/Endocrine Therapy Line of Therapy: Third Line and Beyond Intent of Therapy: Non-Curative / Palliative Intent, Discussed with Patient

## 2024-08-15 NOTE — Addendum Note (Signed)
 Addended by: ODEAN POTTS on: 08/15/2024 12:27 PM   Modules accepted: Orders, Level of Service

## 2024-08-15 NOTE — Assessment & Plan Note (Signed)
 06/30/2011: Left breast cancer stage III triple negative, 7.6 cm 09/14/2011: Dose dense FEC followed by dose dense Taxotere  neoadjuvant chemo 12/21/2011: Left lumpectomy: High-grade poorly differentiated IDC 1.7 cm 3/7 nodes positive triple negative, XRT 09/26/2017: Relapse: Right axillary lymph node biopsy positive for metastatic high-grade carcinoma triple negative 11/14/2017: Neoadjuvant Gemzar  and carboplatin  05/31/2023: Relapse: Mediastinal mass biopsy: Metastatic poorly differentiated carcinoma ER 60%, HER2 negative 06/06/2023: Verzinio with letrozole  07/25/2023: Trodelvy  02/21/2024: Chest wall skin biopsy: Poorly differentiated carcinoma ER 0% PR 0% Ki-67 40%, HER2 0 07/25/2023: Trodelvy  05/22/2024: Trodelvy  with Keytruda  --------------------------------------------------------------------------------------------------------------------- Caris molecular testing: PD-L1 mutation: Added Keytruda  to Trodelvy  based on clinical trial ASCENT 04 (median PFS 11.2 months versus 7.8 months)  07/26/2024: PET/CT: Hypermetabolic disease bilateral breasts, intense mediastinal adenopathy, enlarged porta hepatis nodal mass, bilateral inguinal iliac and inguinal adenopathy, diffuse bone marrow hypermetabolism  MRCP 08/01/2024: Marked intrahepatic biliary ductal dilatation and dilated common bile duct Stent placement 08/07/2024  Treatment plan: Switch treatment to eribulin

## 2024-08-16 ENCOUNTER — Encounter: Payer: Self-pay | Admitting: Hematology and Oncology

## 2024-08-17 ENCOUNTER — Other Ambulatory Visit: Payer: Self-pay | Admitting: *Deleted

## 2024-08-17 ENCOUNTER — Encounter: Payer: Self-pay | Admitting: Hematology and Oncology

## 2024-08-17 MED ORDER — CIPROFLOXACIN HCL 500 MG PO TABS: 500.0000 mg | ORAL_TABLET | Freq: Two times a day (BID) | ORAL | 0 refills | Status: DC

## 2024-08-17 NOTE — Progress Notes (Signed)
 Received mychart message from pt with complaint of UTI symptoms including urinary frequency and urgency.  Per MD pt needing to be prescribed Cipro  500 mg p.o BID x7 days.  Prescription sent to pharmacy on file, pt educated and verbalized understanding.

## 2024-08-20 ENCOUNTER — Inpatient Hospital Stay

## 2024-08-20 ENCOUNTER — Inpatient Hospital Stay: Admitting: Pharmacist

## 2024-08-20 VITALS — BP 138/70 | HR 112 | Temp 98.3°F | Resp 17 | Wt 204.3 lb

## 2024-08-20 DIAGNOSIS — C50919 Malignant neoplasm of unspecified site of unspecified female breast: Secondary | ICD-10-CM

## 2024-08-20 DIAGNOSIS — Z5111 Encounter for antineoplastic chemotherapy: Secondary | ICD-10-CM | POA: Diagnosis not present

## 2024-08-20 DIAGNOSIS — N6331 Unspecified lump in axillary tail of the right breast: Secondary | ICD-10-CM

## 2024-08-20 LAB — CBC WITH DIFFERENTIAL (CANCER CENTER ONLY)
Abs Immature Granulocytes: 0.1 K/uL — ABNORMAL HIGH (ref 0.00–0.07)
Basophils Absolute: 0.1 K/uL (ref 0.0–0.1)
Basophils Relative: 2 %
Eosinophils Absolute: 0 K/uL (ref 0.0–0.5)
Eosinophils Relative: 1 %
HCT: 24.4 % — ABNORMAL LOW (ref 36.0–46.0)
Hemoglobin: 8.1 g/dL — ABNORMAL LOW (ref 12.0–15.0)
Lymphocytes Relative: 8 %
Lymphs Abs: 0.2 K/uL — ABNORMAL LOW (ref 0.7–4.0)
MCH: 33.9 pg (ref 26.0–34.0)
MCHC: 33.2 g/dL (ref 30.0–36.0)
MCV: 102.1 fL — ABNORMAL HIGH (ref 80.0–100.0)
Metamyelocytes Relative: 5 %
Monocytes Absolute: 0.2 K/uL (ref 0.1–1.0)
Monocytes Relative: 8 %
Neutro Abs: 1.9 K/uL (ref 1.7–7.7)
Neutrophils Relative %: 76 %
Platelet Count: 89 K/uL — ABNORMAL LOW (ref 150–400)
RBC: 2.39 MIL/uL — ABNORMAL LOW (ref 3.87–5.11)
RDW: 20 % — ABNORMAL HIGH (ref 11.5–15.5)
WBC Count: 2.5 K/uL — ABNORMAL LOW (ref 4.0–10.5)
nRBC: 0 % (ref 0.0–0.2)

## 2024-08-20 LAB — CMP (CANCER CENTER ONLY)
ALT: 18 U/L (ref 0–44)
AST: 20 U/L (ref 15–41)
Albumin: 3.5 g/dL (ref 3.5–5.0)
Alkaline Phosphatase: 162 U/L — ABNORMAL HIGH (ref 38–126)
Anion gap: 11 (ref 5–15)
BUN: 25 mg/dL — ABNORMAL HIGH (ref 6–20)
CO2: 25 mmol/L (ref 22–32)
Calcium: 9.2 mg/dL (ref 8.9–10.3)
Chloride: 100 mmol/L (ref 98–111)
Creatinine: 1.15 mg/dL — ABNORMAL HIGH (ref 0.44–1.00)
GFR, Estimated: 57 mL/min — ABNORMAL LOW (ref >60)
Glucose, Bld: 112 mg/dL — ABNORMAL HIGH (ref 70–99)
Potassium: 3.4 mmol/L — ABNORMAL LOW (ref 3.5–5.1)
Sodium: 136 mmol/L (ref 135–145)
Total Bilirubin: 1.8 mg/dL — ABNORMAL HIGH (ref 0.0–1.2)
Total Protein: 7 g/dL (ref 6.5–8.1)

## 2024-08-20 LAB — SAMPLE TO BLOOD BANK

## 2024-08-20 LAB — MAGNESIUM: Magnesium: 2 mg/dL (ref 1.7–2.4)

## 2024-08-20 NOTE — Progress Notes (Signed)
  Cancer Center       Telephone: [phone]?Fax: [phone]   Oncology Clinical Pharmacist Practitioner Initial Assessment  Jessica Simpson is a 53 y.o. female with a diagnosis of breast cancer. They were contacted today via in-person visit.  Indication/Regimen Eribulin ([place]) is being used appropriately for treatment of metastatic breast cancer by Dr. Vinay Gudena.      Wt Readings from Last 1 Encounters:  08/20/24 204 lb 4.8 oz (92.7 kg)    Estimated body surface area is 2.08 meters squared as calculated from the following:   Height as of 08/07/24: 5' 6 (1.676 m).   Weight as of this encounter: 204 lb 4.8 oz (92.7 kg).  The dosing regimen is every 21 days until disease progression or unacceptable toxicities  Eribulin (1.4 mg/m2) on Day(s) 1,8  Dose Modifications Dr. Odean is removing Day 8 out of the treatment plan   Allergies Allergies  Allergen Reactions   Codeine Nausea And Vomiting   Hydrocodone-Acetaminophen  Itching, Nausea And Vomiting and Other (See Comments)    NO Lortab    Vitals    08/20/2024    2:20 PM 08/07/2024    4:10 PM 08/07/2024    4:00 PM  Oncology Vitals  Weight 92.67 kg    Weight (lbs) 204 lbs 5 oz    BMI 32.97 kg/m2    Temp 98.3 F (36.8 C)    Pulse Rate 112 95 91  BP 138/70 153/71 159/61  Resp 17 18 17   SpO2 92 % 94 % 94 %  BSA (m2) 2.08 m2       Laboratory Data    Latest Ref Rng & Units 08/20/2024    1:44 PM 08/03/2024   11:06 AM 08/03/2024    4:19 AM  CBC EXTENDED  WBC 4.0 - 10.5 K/uL 2.5   2.4   RBC 3.87 - 5.11 MIL/uL 2.39   2.13   Hemoglobin 12.0 - 15.0 g/dL 8.1  8.9  6.6   HCT 63.9 - 46.0 % 24.4  29.0  22.0   Platelets 150 - 400 K/uL 89   114   NEUT# 1.7 - 7.7 K/uL 1.9     Lymph# 0.7 - 4.0 K/uL 0.2          Latest Ref Rng & Units 08/20/2024    1:44 PM 08/03/2024    4:19 AM 08/02/2024   11:41 AM  CMP  Glucose 70 - 99 mg/dL 887  899  869   BUN 6 - 20 mg/dL 25  12  13    Creatinine  0.44 - 1.00 mg/dL 8.84  8.98  9.01   Sodium 135 - 145 mmol/L 136  136  137   Potassium 3.5 - 5.1 mmol/L 3.4  3.9  3.3   Chloride 98 - 111 mmol/L 100  102  101   CO2 22 - 32 mmol/L 25  24  27    Calcium 8.9 - 10.3 mg/dL 9.2  9.2  9.5   Total Protein 6.5 - 8.1 g/dL 7.0  6.2  6.9   Total Bilirubin 0.0 - 1.2 mg/dL 1.8  5.3  5.6   Alkaline Phos 38 - 126 U/L 162  700  716   AST 15 - 41 U/L 20  103  79   ALT 0 - 44 U/L 18  160  152    Lab Results  Component Value Date   MG 2.0 08/20/2024   MG 1.7 08/02/2024   MG 1.7 07/31/2024   Lab  Results  Component Value Date   CA2729 38.7 (H) 10/14/2017     Contraindications Contraindications were reviewed? Yes Contraindications to therapy were identified? No   Safety Precautions The following safety precautions for the use of eribulin were reviewed:  Fever: reviewed the importance of having a thermometer and the Centers for Disease Control and Prevention (CDC) definition of fever which is 100.61F (38C) or higher. Patient should call 24/7 triage at [phone] if experiencing a fever or any other symptoms Decreased white blood cells (WBCs) and increased risk for infection Decreased hemoglobin, part of the red blood cells that carry iron and oxygen Decreased platelet count and increased risk of bleeding Numbness or tingling in hands and feet Fatigue Hair Loss (alopecia) Constipation Changes in electrolytes and other laboratory values (low potassium, low calcium) Changes in liver function Nausea or vomiting Changes in electrical activity of your heart. Symptoms: feeling faint, lightheaded, dizzy, palpitations/rapid heart rate) Handling body fluids and waste Intimacy, sexual activity, contraception, fertility, breastfeeding  Medication Reconciliation Current Outpatient Medications  Medication Sig Dispense Refill   albuterol  (ACCUNEB ) 0.63 MG/3ML nebulizer solution Take 3 mLs (0.63 mg total) by nebulization every 6 (six) hours as needed for  wheezing. 75 mL 12   amLODipine  (NORVASC ) 5 MG tablet Take 10 mg by mouth daily.  6   Cyanocobalamin  (VITAMIN B-12) 5000 MCG TBDP Take 5,000 mcg by mouth daily.     diphenoxylate -atropine  (LOMOTIL ) 2.5-0.025 MG tablet Take 1-2 tablets by mouth 4 (four) times daily as needed for diarrhea or loose stools. 60 tablet 0   ibuprofen  (ADVIL ) 800 MG tablet TAKE 1 TABLET BY MOUTH EVERY 8 HOURS AS NEEDED 30 tablet 0   lidocaine -prilocaine  (EMLA ) cream Apply 1 Application topically as needed (as directed).     loratadine  (CLARITIN ) 10 MG tablet Take 1 tablet (10 mg total) by mouth daily as needed for allergies. 90 tablet 2   LORazepam  (ATIVAN ) 1 MG tablet Take 1 tablet (1 mg total) by mouth every 8 (eight) hours. 10 tablet 0   magnesium  oxide (MAG-OX) 400 MG tablet TAKE 1 TABLET BY MOUTH EVERY DAY 30 tablet 1   metoprolol  succinate (TOPROL  XL) 25 MG 24 hr tablet Take 1 tablet (25 mg total) by mouth daily. 90 tablet 3   ondansetron  (ZOFRAN ) 8 MG tablet Take 1 tablet (8 mg total) by mouth 2 (two) times daily. 60 tablet 6   oxyCODONE  (OXY IR/ROXICODONE ) 5 MG immediate release tablet Take 1 tablet (5 mg total) by mouth every 4 (four) hours as needed for severe pain (pain score 7-10). 30 tablet 0   sulfamethoxazole -trimethoprim (BACTRIM DS) 800-160 MG tablet Take 1 tablet by mouth 3 (three) times a week. 15 tablet 0   Vitamin D , Ergocalciferol , (DRISDOL ) 1.25 MG (50000 UNIT) CAPS capsule Take 1 capsule (50,000 Units total) by mouth once a week. 12 capsule 2   No current facility-administered medications for this visit.    Medication reconciliation is based on the patient's most recent medication list in the electronic medical record (EMR) including herbal products and OTC medications.   The patient's medication list was reviewed today with the patient? Yes   Drug-drug interactions (DDIs) DDIs were evaluated? Yes Significant DDIs identified? No   Drug-Food Interactions Drug-food interactions were  evaluated? Yes Drug-food interactions identified? No   Follow-up Plan  Treatment start date: 08/22/24 Port placement date: 08/01/23 We reviewed the prescriptions, premedications, and treatment regimen with the patient. Possible side effects of the treatment regimen were reviewed and management  strategies were discussed.  Can use loperamide as needed for diarrhea and Senna-S as needed for constipation.  Bilirubin is decreasing and other LFTs now WNL. Potassium just below LLN. Will increase potassium intake via diet. Encouraged increased oral fluid intake Starting Bactrim tomorrow x 5 days per Dr. Gudena and Morna Kendall NP. Patient says she is starting tomorrow for wound. Clinical pharmacy will assist Dr. Vinay Gudena and [name] on an as needed basis going forward  [name] participated in the discussion, expressed understanding, and voiced agreement with the above plan. All questions were answered to her satisfaction. The patient was advised to contact the clinic at [phone] with any questions or concerns prior to her return visit.   I spent 60 minutes assessing the patient.  Jessica Simpson, PharmD, BCOP, CPP  Jessica Simpson, RPH-CPP, 08/20/2024 3:03 PM  **Disclaimer: This note was dictated with voice recognition software. Similar sounding words can inadvertently be transcribed and this note may contain transcription errors which may not have been corrected upon publication of note.**

## 2024-08-21 ENCOUNTER — Other Ambulatory Visit

## 2024-08-21 ENCOUNTER — Inpatient Hospital Stay

## 2024-08-21 LAB — CANCER ANTIGEN 27.29: CA 27.29: 54.7 U/mL — ABNORMAL HIGH (ref 0.0–38.6)

## 2024-08-22 ENCOUNTER — Inpatient Hospital Stay

## 2024-08-22 ENCOUNTER — Other Ambulatory Visit: Payer: Self-pay | Admitting: *Deleted

## 2024-08-22 ENCOUNTER — Inpatient Hospital Stay: Admitting: Hematology and Oncology

## 2024-08-22 VITALS — BP 152/89 | HR 116 | Temp 97.6°F | Resp 20 | Ht 66.0 in | Wt 204.0 lb

## 2024-08-22 VITALS — BP 176/87 | HR 102 | Temp 98.5°F | Resp 20

## 2024-08-22 DIAGNOSIS — C50919 Malignant neoplasm of unspecified site of unspecified female breast: Secondary | ICD-10-CM

## 2024-08-22 DIAGNOSIS — Z5111 Encounter for antineoplastic chemotherapy: Secondary | ICD-10-CM | POA: Diagnosis not present

## 2024-08-22 DIAGNOSIS — Z95828 Presence of other vascular implants and grafts: Secondary | ICD-10-CM

## 2024-08-22 DIAGNOSIS — C771 Secondary and unspecified malignant neoplasm of intrathoracic lymph nodes: Secondary | ICD-10-CM | POA: Diagnosis not present

## 2024-08-22 DIAGNOSIS — N6331 Unspecified lump in axillary tail of the right breast: Secondary | ICD-10-CM

## 2024-08-22 LAB — PREPARE RBC (CROSSMATCH)

## 2024-08-22 MED ORDER — ACETAMINOPHEN 325 MG PO TABS
650.0000 mg | ORAL_TABLET | Freq: Once | ORAL | Status: AC
  Administered 2024-08-22: 650 mg via ORAL
  Filled 2024-08-22: qty 2

## 2024-08-22 MED ORDER — SODIUM CHLORIDE 0.9% IV SOLUTION
250.0000 mL | INTRAVENOUS | Status: DC
  Administered 2024-08-22: 250 mL via INTRAVENOUS

## 2024-08-22 MED ORDER — DIPHENHYDRAMINE HCL 25 MG PO CAPS
25.0000 mg | ORAL_CAPSULE | Freq: Once | ORAL | Status: AC
  Administered 2024-08-22: 25 mg via ORAL
  Filled 2024-08-22: qty 1

## 2024-08-22 MED ORDER — PROCHLORPERAZINE MALEATE 10 MG PO TABS
10.0000 mg | ORAL_TABLET | Freq: Once | ORAL | Status: AC
  Administered 2024-08-22: 10 mg via ORAL
  Filled 2024-08-22: qty 1

## 2024-08-22 MED ORDER — DARBEPOETIN ALFA 300 MCG/0.6ML IJ SOSY
300.0000 ug | PREFILLED_SYRINGE | Freq: Once | INTRAMUSCULAR | Status: AC
  Administered 2024-08-22: 300 ug via SUBCUTANEOUS
  Filled 2024-08-22: qty 0.6

## 2024-08-22 MED ORDER — SODIUM CHLORIDE 0.9 % IV SOLN
1.4000 mg/m2 | Freq: Once | INTRAVENOUS | Status: AC
  Administered 2024-08-22: 3 mg via INTRAVENOUS
  Filled 2024-08-22: qty 6

## 2024-08-22 MED ORDER — SODIUM CHLORIDE 0.9 % IV SOLN: INTRAVENOUS | Status: DC

## 2024-08-22 NOTE — Progress Notes (Signed)
 Per MD request orders placed for 1 unit of blood.

## 2024-08-22 NOTE — Progress Notes (Signed)
 Patient Care Team: [name], MD as PCP - General (Family Medicine) Waddell Danelle ORN, MD as PCP - Electrophysiology (Cardiology) Odean Potts, MD as Consulting Physician (Hematology and Oncology) Pickenpack-Cousar, Fannie SAILOR, NP as Nurse Practitioner [facility] and Palliative Medicine)  DIAGNOSIS:  Encounter Diagnosis  Name Primary?   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)  06/30/2011 Initial Diagnosis   Breast cancer, IDC, Left, Stage III, Triple negative, 7.6 cm breast mass and palpable axillary mass   09/06/2011 Miscellaneous   BRCA 1 and 2: Negative   09/14/2011 - 11/23/2011 Neo-Adjuvant Chemotherapy   Dose dense FEC followed by dose dense Taxotere    12/21/2011 Surgery   Left lumpectomy: High-grade poorly differentiated IDC 1.7 cm with high-grade DCIS, margins negative, 3/7 lymph nodes positive, ER 0%, PR 0%, HER-2 negative ratio 1.33 T1CN1 stage IIb   12/31/2011 - 02/15/2012 Radiation Therapy   Radiation at [facility]   09/26/2017 Relapse/Recurrence   Left breast upper outer quadrant within the lumpectomy bed: Fibrosis no malignancy, right axillary lymph node biopsy metastatic high-grade carcinoma ER 0%, PR 0%, Ki-67 90%, HER-2 negative ratio 1.11 (similar to previous ductal carcinoma)   11/14/2017 - 03/06/2018 Neo-Adjuvant Chemotherapy   Neo-Adjuvant chemotherapy with Gemzar  and carboplatin  days 1 and 8 every 3 weeks   01/10/2018 Genetic Testing   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  The Hereditary Gene Panel offered by [facility] includes sequencing and/or deletion duplication testing of the following 47 genes: APC, ATM, AXIN2, BARD1, BMPR1A, BRCA1, BRCA2, BRIP1, CDH1, CDK4, CDKN2A (p14ARF), CDKN2A (p16INK4a), CHEK2, CTNNA1, DICER1, EPCAM (Deletion/duplication testing  only), GREM1 (promoter region deletion/duplication testing only), KIT, MEN1, MLH1, MSH2, MSH3, MSH6, MUTYH, NBN, NF1, NHTL1, PALB2, PDGFRA, PMS2, POLD1, POLE, PTEN, RAD50, RAD51C, RAD51D, SDHB, SDHC, SDHD, SMAD4, SMARCA4. STK11, TP53, TSC1, TSC2, and VHL.  The following genes were evaluated for sequence changes only: SDHA and HOXB13 c.251G>A variant only. The report date is January 10, 2018.    05/31/2023 Procedure   Mediastinal mass biopsy: Metastatic poorly differentiated carcinoma compatible with breast primary ER +60%, PR 0%, HER2 0, Ki-67 40%    06/02/2023 PET scan   PET/CT Anterior mediastinal mass is hypermetabolic and centrally necrotic, soft tissue lesions anterior to the heart, 10 mm pleural-based lesion, level 2 cervical nodes, hypermetabolism hepatic dome mottled FDG accumulation throughout the skeletal system    06/06/2023 - 07/14/2023 Anti-estrogen oral therapy   Antiestrogen therapy with Verzenio  and letrozole     07/07/2023 Relapse/Recurrence   Increasing mediastinal mass 11.6 cm (used to be 10.8 cm) effacing the heart and involving the sternum, additional mediastinal/pericardial lymph nodes increased right extra lymph node 1.5 cm, nodules 0.2 cm (used to be 0.8 cm), few new scattered lung nodules)   07/25/2023 - 07/13/2024 Chemotherapy   Patient is on Treatment Plan : BREAST METASTATIC Sacituzumab govitecan -hziy (Trodelvy ) D1,8 q21d     08/22/2024 -  Chemotherapy   Patient is on Treatment Plan : BREAST Eribulin D1,8 q21d     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)  04/06/2018 Surgery   Right axillary lymph node dissection: 1/11 node positive for metastatic high-grade carcinoma   04/25/2018 Initial Diagnosis   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)   04/25/2018 Cancer Staging   Staging form: Breast, AJCC 8th Edition - Clinical: Stage IIB (cT0, cN1, cM0,  G3, ER-, PR-, HER2-) - Signed by Izell Domino, MD on 04/25/2018   05/16/2018 - 06/22/2018 Radiation Therapy    Adjuvant radiation therapy with Xeloda    07/11/2018 - 10/24/2018 Chemotherapy   Adjuvant chemotherapy with CMF     CHIEF COMPLIANT: Cycle 1 eribulin  HISTORY OF PRESENT ILLNESS: History of Present Illness Jessica Simpson is a 53 year old female with cancer who presents for treatment evaluation and management of fluid retention. She is accompanied by her daughter, who is also her primary caregiver.  She experiences significant fluid retention, particularly in the abdominal area, causing discomfort and affecting daily activities. This is especially problematic when sitting in a car.  She has experienced adverse reactions to previous treatments, including loss of fingernails and burning sensations on her hands. She is concerned about potential side effects of the current treatment regimen.  She recently had a urinary tract infection treated with an antibiotic, possibly Cipro . Although the burning sensation has resolved, urgency persists, raising concerns about the impact of fluid retention on bladder function.  A stent was recently placed, and laboratory results have changed since then. She remains anemic with a hemoglobin level of 8.1 and has a low platelet count of 89, which are being monitored closely.     ALLERGIES:  is allergic to codeine and hydrocodone-acetaminophen .  MEDICATIONS:  Current Outpatient Medications  Medication Sig Dispense Refill   albuterol  (ACCUNEB ) 0.63 MG/3ML nebulizer solution Take 3 mLs (0.63 mg total) by nebulization every 6 (six) hours as needed for wheezing. 75 mL 12   amLODipine  (NORVASC ) 5 MG tablet Take 10 mg by mouth daily.  6   Cyanocobalamin  (VITAMIN B-12) 5000 MCG TBDP Take 5,000 mcg by mouth daily.     diphenoxylate -atropine  (LOMOTIL ) 2.5-0.025 MG tablet Take 1-2 tablets by mouth 4 (four) times daily as needed for diarrhea or loose stools. 60 tablet 0   ibuprofen  (ADVIL ) 800 MG tablet TAKE 1 TABLET BY MOUTH EVERY 8 HOURS AS NEEDED 30 tablet 0    lidocaine -prilocaine  (EMLA ) cream Apply 1 Application topically as needed (as directed).     loratadine  (CLARITIN ) 10 MG tablet Take 1 tablet (10 mg total) by mouth daily as needed for allergies. 90 tablet 2   LORazepam  (ATIVAN ) 1 MG tablet Take 1 tablet (1 mg total) by mouth every 8 (eight) hours. 10 tablet 0   magnesium  oxide (MAG-OX) 400 MG tablet TAKE 1 TABLET BY MOUTH EVERY DAY 30 tablet 1   metoprolol  succinate (TOPROL  XL) 25 MG 24 hr tablet Take 1 tablet (25 mg total) by mouth daily. 90 tablet 3   ondansetron  (ZOFRAN ) 8 MG tablet Take 1 tablet (8 mg total) by mouth 2 (two) times daily. 60 tablet 6   oxyCODONE  (OXY IR/ROXICODONE ) 5 MG immediate release tablet Take 1 tablet (5 mg total) by mouth every 4 (four) hours as needed for severe pain (pain score 7-10). 30 tablet 0   sulfamethoxazole -trimethoprim (BACTRIM DS) 800-160 MG tablet Take 1 tablet by mouth 3 (three) times a week. 15 tablet 0   Vitamin D , Ergocalciferol , (DRISDOL ) 1.25 MG (50000 UNIT) CAPS capsule Take 1 capsule (50,000 Units total) by mouth once a week. 12 capsule 2   No current facility-administered medications for this visit.    PHYSICAL EXAMINATION: ECOG PERFORMANCE STATUS: 1 - Symptomatic but completely ambulatory  Vitals:   08/22/24 0848  BP: (!) 152/89  Pulse: (!) 116  Resp: 20  Temp: 97.6 F (36.4 C)  SpO2: 99%   Filed Weights  08/22/24 0848  Weight: 204 lb (92.5 kg)    LABORATORY DATA:  I have reviewed the data as listed    Latest Ref Rng & Units 08/20/2024    1:44 PM 08/03/2024    4:19 AM 08/02/2024   11:41 AM  CMP  Glucose 70 - 99 mg/dL 887  899  869   BUN 6 - 20 mg/dL 25  12  13    Creatinine 0.44 - 1.00 mg/dL 8.84  8.98  9.01   Sodium 135 - 145 mmol/L 136  136  137   Potassium 3.5 - 5.1 mmol/L 3.4  3.9  3.3   Chloride 98 - 111 mmol/L 100  102  101   CO2 22 - 32 mmol/L 25  24  27    Calcium 8.9 - 10.3 mg/dL 9.2  9.2  9.5   Total Protein 6.5 - 8.1 g/dL 7.0  6.2  6.9   Total Bilirubin 0.0  - 1.2 mg/dL 1.8  5.3  5.6   Alkaline Phos 38 - 126 U/L 162  700  716   AST 15 - 41 U/L 20  103  79   ALT 0 - 44 U/L 18  160  152     Lab Results  Component Value Date   WBC 2.5 (L) 08/20/2024   HGB 8.1 (L) 08/20/2024   HCT 24.4 (L) 08/20/2024   MCV 102.1 (H) 08/20/2024   PLT 89 (L) 08/20/2024   NEUTROABS 1.9 08/20/2024    ASSESSMENT & PLAN:  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) 06/30/2011: Left breast cancer stage III triple negative, 7.6 cm 09/14/2011: Dose dense FEC followed by dose dense Taxotere  neoadjuvant chemo 12/21/2011: Left lumpectomy: High-grade poorly differentiated IDC 1.7 cm 3/7 nodes positive triple negative, XRT 09/26/2017: Relapse: Right axillary lymph node biopsy positive for metastatic high-grade carcinoma triple negative 11/14/2017: Neoadjuvant Gemzar  and carboplatin  05/31/2023: Relapse: Mediastinal mass biopsy: Metastatic poorly differentiated carcinoma ER 60%, HER2 negative 06/06/2023: Verzinio with letrozole  07/25/2023: Trodelvy  02/21/2024: Chest wall skin biopsy: Poorly differentiated carcinoma ER 0% PR 0% Ki-67 40%, HER2 0 07/25/2023: Trodelvy  05/22/2024: Trodelvy  with Keytruda  --------------------------------------------------------------------------------------------------------------------- [facility] 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  I discussed with her  daughter that she has short life expectancy and if the current treatment does not work we are not looking to continue with any more systemic treatments and that we would focus on hospice care.  She understands this and is agreeable.  Extensive skin involvement: Not only of the breast and the breast wound also abdominal wall is confluent metastatic disease. She was asking for paracentesis but I explained to her that the fluid is not inside her belly it is in this subcutaneous tissues  F/U in 3 weeks for cycle 2 ------------------------------------- Assessment and Plan Assessment & Plan Metastatic breast cancer with biliary obstruction, abdominal wall edema, malnutrition, and wound infection Advanced metastatic breast cancer with complications. Current treatment is a last effort due to previous failures. Abdominal wall edema from fluid retention related to obstruction and malnutrition. Wound  infection indicated by odor. Liver function improved post-stent with normalized enzymes and bilirubin. - Proceed with chemotherapy, monitor response and side effects. - Monitor CA 27-29 for treatment response. - Consider hospice if no response after 2-3 chemotherapy rounds. - Educated family on decline signs and hospice options.  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 - Request type and crossmatch for possible transfusion. - Administer at least one unit of blood to improve hemoglobin and symptoms.  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 - Proceed with chemotherapy despite thrombocytopenia.      No orders of the defined types were placed in this encounter.  The patient has a good understanding of the overall plan. she agrees with it. she will call with any problems that may develop before the next visit here.  I personally  spent a total of 45 minutes in the care of the patient today including preparing to see the patient, getting/reviewing separately obtained history, performing a medically appropriate exam/evaluation, counseling and educating, placing orders, referring and communicating with other health care professionals, documenting clinical information in the EHR, independently interpreting results, communicating results, and coordinating care.   Viinay K Orly Quimby, MD 08/22/24

## 2024-08-22 NOTE — Patient Instructions (Addendum)
 [facility] - A DEPT OF [facility]  Discharge Instructions: Thank you for choosing [facility] to provide your oncology and hematology care.   If you have a lab appointment with the Cancer Center, please go directly to the Cancer Center and check in at the registration area.   Wear comfortable clothing and clothing appropriate for easy access to any Portacath or PICC line.   We strive to give you quality time with your provider. You may need to reschedule your appointment if you arrive late (15 or more minutes).  Arriving late affects you and other patients whose appointments are after yours.  Also, if you miss three or more appointments without notifying the office, you may be dismissed from the clinic at the provider's discretion.      For prescription refill requests, have your pharmacy contact our office and allow 72 hours for refills to be completed.    Today you received the following chemotherapy and/or immunotherapy agents: Eribulin mesylate ([place])    To help prevent nausea and vomiting after your treatment, we encourage you to take your nausea medication as directed.  BELOW ARE SYMPTOMS THAT SHOULD BE REPORTED IMMEDIATELY: *FEVER GREATER THAN 100.4 F (38 C) OR HIGHER *CHILLS OR SWEATING *NAUSEA AND VOMITING THAT IS NOT CONTROLLED WITH YOUR NAUSEA MEDICATION *UNUSUAL SHORTNESS OF BREATH *UNUSUAL BRUISING OR BLEEDING *URINARY PROBLEMS (pain or burning when urinating, or frequent urination) *BOWEL PROBLEMS (unusual diarrhea, constipation, pain near the anus) TENDERNESS IN MOUTH AND THROAT WITH OR WITHOUT PRESENCE OF ULCERS (sore throat, sores in mouth, or a toothache) UNUSUAL RASH, SWELLING OR PAIN  UNUSUAL VAGINAL DISCHARGE OR ITCHING   Items with * indicate a potential emergency and should be followed up as soon as possible or go to the Emergency Department if any problems should occur.  Please show the CHEMOTHERAPY ALERT CARD  or IMMUNOTHERAPY ALERT CARD at check-in to the Emergency Department and triage nurse.  Should you have questions after your visit or need to cancel or reschedule your appointment, please contact [facility] - A DEPT OF [facility]  Dept: [phone]  and follow the prompts.  Office hours are 8:00 a.m. to 4:30 p.m. Monday - Friday. Please note that voicemails left after 4:00 p.m. may not be returned until the following business day.  We are closed weekends and major holidays. You have access to a nurse at all times for urgent questions. Please call the main number to the clinic Dept: [phone] and follow the prompts.   For any non-urgent questions, you may also contact your provider using MyChart. We now offer e-Visits for anyone 11 and older to request care online for non-urgent symptoms. For details visit [email or web address].   Also download the MyChart app! Go to the app store, search MyChart, open the app, select [place], and log in with your MyChart username and password.  Blood Transfusion, Adult, Care After After a blood transfusion, it is common to have: Bruising and soreness at the IV site. A headache. Follow these instructions at home: Your doctor may give you more instructions. If you have problems, contact your doctor. Insertion site care     Follow instructions from your doctor about how to take care of your insertion site. This is where an IV tube was put into your vein. Make sure you: Wash your hands with soap and water  for at least 20 seconds before and after  you change your bandage. If you cannot use soap and water , use hand sanitizer. Change your bandage as told by your doctor. Check your insertion site every day for signs of infection. Check for: Redness, swelling, or pain. Bleeding from the site. Warmth. Pus or a bad smell. General instructions Take over-the-counter and prescription medicines only as told by your  doctor. Rest as told by your doctor. Go back to your normal activities as told by your doctor. Keep all follow-up visits. You may need to have tests at certain times to check your blood. Contact a doctor if: You have itching or red, swollen areas of skin (hives). You have a fever or chills. You have pain in the head, back, or chest. You feel worried or nervous (anxious). You feel weak after doing your normal activities. You have any of these problems at the insertion site: Redness, swelling, warmth, or pain. Bleeding that does not stop with pressure. Pus or a bad smell. If you received your blood transfusion in an outpatient setting, you will be told whom to contact to report any reactions. Get help right away if: You have signs of a serious reaction. This may be coming from an allergy or the body's defense system (immune system). Signs include: Trouble breathing or shortness of breath. Swelling of the face or feeling warm (flushed). A widespread rash. Dark pee (urine) or blood in the pee. Fast heartbeat. These symptoms may be an emergency. Get help right away. Call 911. Do not wait to see if the symptoms will go away. Do not drive yourself to the hospital. Summary Bruising and soreness at the IV site are common. Check your insertion site every day for signs of infection. Rest as told by your doctor. Go back to your normal activities as told by your doctor. Get help right away if you have signs of a serious reaction. This information is not intended to replace advice given to you by your health care provider. Make sure you discuss any questions you have with your health care provider. Document Revised: 01/08/2022 Document Reviewed: 01/08/2022 Elsevier Patient Education  2024 Elsevier Inc.  Darbepoetin Alfa  Injection What is this medication? DARBEPOETIN ALFA  (dar be POE e tin AL fa) treats low levels of red blood cells (anemia) caused by kidney disease or chemotherapy. It works by  systems analyst make more red blood cells, which reduces the need for blood transfusions. This medicine may be used for other purposes; ask your health care provider or pharmacist if you have questions. COMMON BRAND NAME(S): Aranesp  What should I tell my care team before I take this medication? They need to know if you have any of these conditions: Blood clots Cancer Heart disease High blood pressure On dialysis Seizures Stroke An unusual or allergic reaction to darbepoetin, latex, other medications, foods, dyes, or preservatives Pregnant or trying to get pregnant Breast-feeding How should I use this medication? This medication is injected into a vein or under the skin. It is usually given by a care team in a hospital or clinic setting. It may also be given at home. If you get this medication at home, you will be taught how to prepare and give it. Use exactly as directed. Take it as directed on the prescription label at the same time every day. Keep taking it unless your care team tells you to stop. It is important that you put your used needles and syringes in a special sharps container. Do not put them in a trash can.  If you do not have a sharps container, call your pharmacist or care team to get one. A special MedGuide will be given to you by the pharmacist with each prescription and refill. Be sure to read this information carefully each time. Talk to your care team about the use of this medication in children. While this medication may be used in children as young as 1 month of age for selected conditions, precautions do apply. Overdosage: If you think you have taken too much of this medicine contact a poison control center or emergency room at once. NOTE: This medicine is only for you. Do not share this medicine with others. What if I miss a dose? If you miss a dose, take it as soon as you can. If it is almost time for your next dose, take only that dose. Do not take double or extra  doses. What may interact with this medication? Epoetin alfa Methoxy polyethylene glycol-epoetin beta This list may not describe all possible interactions. Give your health care provider a list of all the medicines, herbs, non-prescription drugs, or dietary supplements you use. Also tell them if you smoke, drink alcohol, or use illegal drugs. Some items may interact with your medicine. What should I watch for while using this medication? Visit your care team for regular checks on your progress. Check your blood pressure as directed. Know what your blood pressure should be and when to contact your care team. Your condition will be monitored carefully while you are receiving this medication. You may need blood work while taking this medication. What side effects may I notice from receiving this medication? Side effects that you should report to your care team as soon as possible: Allergic reactions--skin rash, itching, hives, swelling of the face, lips, tongue, or throat Blood clot--pain, swelling, or warmth in the leg, shortness of breath, chest pain Heart attack--pain or tightness in the chest, shoulders, arms, or jaw, nausea, shortness of breath, cold or clammy skin, feeling faint or lightheaded Increase in blood pressure Rash, fever, and swollen lymph nodes Redness, blistering, peeling, or loosening of the skin, including inside the mouth Seizures Stroke--sudden numbness or weakness of the face, arm, or leg, trouble speaking, confusion, trouble walking, loss of balance or coordination, dizziness, severe headache, change in vision Side effects that usually do not require medical attention (report to your care team if they continue or are bothersome): Cough Stomach pain Swelling of the ankles, hands, or feet This list may not describe all possible side effects. Call your doctor for medical advice about side effects. You may report side effects to FDA at 1-800-FDA-1088. Where should I keep my  medication? Keep out of the reach of children and pets. Store in a refrigerator. Do not freeze. Do not shake. Protect from light. Keep this medication in the original container until you are ready to take it. See product for storage information. Get rid of any unused medication after the expiration date. To get rid of medications that are no longer needed or have expired: Take the medication to a medication take-back program. Check with your pharmacy or law enforcement to find a location. If you cannot return the medication, ask your pharmacist or care team how to get rid of the medication safely. NOTE: This sheet is a summary. It may not cover all possible information. If you have questions about this medicine, talk to your doctor, pharmacist, or health care provider.  2024 Elsevier/Gold Standard (2022-02-10 00:00:00)

## 2024-08-22 NOTE — Assessment & Plan Note (Signed)
 06/30/2011: Left breast cancer stage III triple negative, 7.6 cm 09/14/2011: Dose dense FEC followed by dose dense Taxotere  neoadjuvant chemo 12/21/2011: Left lumpectomy: High-grade poorly differentiated IDC 1.7 cm 3/7 nodes positive triple negative, XRT 09/26/2017: Relapse: Right axillary lymph node biopsy positive for metastatic high-grade carcinoma triple negative 11/14/2017: Neoadjuvant Gemzar  and carboplatin  05/31/2023: Relapse: Mediastinal mass biopsy: Metastatic poorly differentiated carcinoma ER 60%, HER2 negative 06/06/2023: Verzinio with letrozole  07/25/2023: Trodelvy  02/21/2024: Chest wall skin biopsy: Poorly differentiated carcinoma ER 0% PR 0% Ki-67 40%, HER2 0 07/25/2023: Trodelvy  05/22/2024: Trodelvy  with Keytruda  --------------------------------------------------------------------------------------------------------------------- [facility] molecular testing: PD-L1 mutation  07/26/2024: PET/CT: Hypermetabolic disease bilateral breasts, intense mediastinal adenopathy, enlarged porta hepatis nodal mass, bilateral inguinal iliac and inguinal adenopathy, diffuse bone marrow hypermetabolism   MRCP 08/01/2024: Marked intrahepatic biliary ductal dilatation and dilated common bile duct Stent placement 08/07/2024  Current Treatment: Cycle 1 Eribulin F/U in 1 week for tox check

## 2024-08-23 ENCOUNTER — Other Ambulatory Visit: Payer: Self-pay | Admitting: *Deleted

## 2024-08-23 ENCOUNTER — Encounter: Payer: Self-pay | Admitting: Hematology and Oncology

## 2024-08-23 DIAGNOSIS — R350 Frequency of micturition: Secondary | ICD-10-CM

## 2024-08-23 LAB — BPAM RBC
Blood Product Expiration Date: 202511252359
ISSUE DATE / TIME: 202510291113
Unit Type and Rh: 5100

## 2024-08-23 LAB — TYPE AND SCREEN
ABO/RH(D): O POS
Antibody Screen: NEGATIVE
Unit division: 0

## 2024-08-24 ENCOUNTER — Inpatient Hospital Stay: Admitting: Adult Health

## 2024-08-24 ENCOUNTER — Inpatient Hospital Stay

## 2024-08-24 NOTE — Progress Notes (Unsigned)
 Spoke with the patient via telephone. She reported that she had visited urgent care in [place] and that her UTI symptoms have resolved. After reviewing the chart and consulting with Dr. Talmadge nurse regarding the upcoming appointment on November 4th, the visit was cancelled. The patient was advised to contact us  if she feels she still needs to be seen. She remains scheduled for her regularly planned visit.

## 2024-08-25 ENCOUNTER — Inpatient Hospital Stay

## 2024-08-25 ENCOUNTER — Other Ambulatory Visit: Payer: Self-pay | Admitting: Internal Medicine

## 2024-08-25 DIAGNOSIS — I10 Essential (primary) hypertension: Secondary | ICD-10-CM

## 2024-08-27 ENCOUNTER — Other Ambulatory Visit: Payer: Self-pay | Admitting: Adult Health

## 2024-08-28 ENCOUNTER — Inpatient Hospital Stay

## 2024-08-28 ENCOUNTER — Inpatient Hospital Stay: Admitting: Hematology and Oncology

## 2024-09-01 ENCOUNTER — Inpatient Hospital Stay (HOSPITAL_COMMUNITY)

## 2024-09-01 ENCOUNTER — Encounter (HOSPITAL_COMMUNITY): Payer: Self-pay | Admitting: Internal Medicine

## 2024-09-01 ENCOUNTER — Inpatient Hospital Stay (HOSPITAL_COMMUNITY)
Admission: EM | Admit: 2024-09-01 | Discharge: 2024-09-24 | DRG: 597 | Disposition: E | Source: Other Acute Inpatient Hospital | Attending: Internal Medicine | Admitting: Internal Medicine

## 2024-09-01 ENCOUNTER — Encounter (HOSPITAL_COMMUNITY): Payer: Self-pay

## 2024-09-01 ENCOUNTER — Other Ambulatory Visit: Payer: Self-pay

## 2024-09-01 DIAGNOSIS — J9601 Acute respiratory failure with hypoxia: Secondary | ICD-10-CM | POA: Diagnosis present

## 2024-09-01 DIAGNOSIS — G4733 Obstructive sleep apnea (adult) (pediatric): Secondary | ICD-10-CM | POA: Diagnosis present

## 2024-09-01 DIAGNOSIS — R400 Somnolence: Secondary | ICD-10-CM | POA: Diagnosis present

## 2024-09-01 DIAGNOSIS — N179 Acute kidney failure, unspecified: Secondary | ICD-10-CM | POA: Diagnosis present

## 2024-09-01 DIAGNOSIS — Z743 Need for continuous supervision: Secondary | ICD-10-CM | POA: Diagnosis not present

## 2024-09-01 DIAGNOSIS — N6310 Unspecified lump in the right breast, unspecified quadrant: Secondary | ICD-10-CM | POA: Diagnosis present

## 2024-09-01 DIAGNOSIS — Z515 Encounter for palliative care: Secondary | ICD-10-CM

## 2024-09-01 DIAGNOSIS — D62 Acute posthemorrhagic anemia: Secondary | ICD-10-CM | POA: Diagnosis present

## 2024-09-01 DIAGNOSIS — C50919 Malignant neoplasm of unspecified site of unspecified female breast: Secondary | ICD-10-CM

## 2024-09-01 DIAGNOSIS — J90 Pleural effusion, not elsewhere classified: Secondary | ICD-10-CM | POA: Diagnosis present

## 2024-09-01 DIAGNOSIS — D630 Anemia in neoplastic disease: Secondary | ICD-10-CM | POA: Diagnosis present

## 2024-09-01 DIAGNOSIS — Z801 Family history of malignant neoplasm of trachea, bronchus and lung: Secondary | ICD-10-CM

## 2024-09-01 DIAGNOSIS — G928 Other toxic encephalopathy: Secondary | ICD-10-CM | POA: Diagnosis present

## 2024-09-01 DIAGNOSIS — F419 Anxiety disorder, unspecified: Secondary | ICD-10-CM | POA: Diagnosis present

## 2024-09-01 DIAGNOSIS — J189 Pneumonia, unspecified organism: Secondary | ICD-10-CM | POA: Diagnosis present

## 2024-09-01 DIAGNOSIS — C7931 Secondary malignant neoplasm of brain: Secondary | ICD-10-CM | POA: Diagnosis present

## 2024-09-01 DIAGNOSIS — D500 Iron deficiency anemia secondary to blood loss (chronic): Secondary | ICD-10-CM | POA: Diagnosis present

## 2024-09-01 DIAGNOSIS — I129 Hypertensive chronic kidney disease with stage 1 through stage 4 chronic kidney disease, or unspecified chronic kidney disease: Secondary | ICD-10-CM | POA: Diagnosis present

## 2024-09-01 DIAGNOSIS — D61818 Other pancytopenia: Secondary | ICD-10-CM | POA: Diagnosis present

## 2024-09-01 DIAGNOSIS — Z950 Presence of cardiac pacemaker: Secondary | ICD-10-CM | POA: Diagnosis not present

## 2024-09-01 DIAGNOSIS — I890 Lymphedema, not elsewhere classified: Secondary | ICD-10-CM | POA: Diagnosis present

## 2024-09-01 DIAGNOSIS — Z7189 Other specified counseling: Secondary | ICD-10-CM

## 2024-09-01 DIAGNOSIS — D84821 Immunodeficiency due to drugs: Secondary | ICD-10-CM | POA: Diagnosis present

## 2024-09-01 DIAGNOSIS — D6959 Other secondary thrombocytopenia: Secondary | ICD-10-CM | POA: Diagnosis present

## 2024-09-01 DIAGNOSIS — Z923 Personal history of irradiation: Secondary | ICD-10-CM

## 2024-09-01 DIAGNOSIS — Z66 Do not resuscitate: Secondary | ICD-10-CM | POA: Diagnosis present

## 2024-09-01 DIAGNOSIS — R451 Restlessness and agitation: Secondary | ICD-10-CM

## 2024-09-01 DIAGNOSIS — Z79899 Other long term (current) drug therapy: Secondary | ICD-10-CM | POA: Diagnosis not present

## 2024-09-01 DIAGNOSIS — Z885 Allergy status to narcotic agent status: Secondary | ICD-10-CM

## 2024-09-01 DIAGNOSIS — T451X5A Adverse effect of antineoplastic and immunosuppressive drugs, initial encounter: Secondary | ICD-10-CM | POA: Diagnosis present

## 2024-09-01 DIAGNOSIS — C801 Malignant (primary) neoplasm, unspecified: Secondary | ICD-10-CM | POA: Diagnosis present

## 2024-09-01 DIAGNOSIS — D649 Anemia, unspecified: Secondary | ICD-10-CM

## 2024-09-01 DIAGNOSIS — Z853 Personal history of malignant neoplasm of breast: Secondary | ICD-10-CM

## 2024-09-01 DIAGNOSIS — Z803 Family history of malignant neoplasm of breast: Secondary | ICD-10-CM

## 2024-09-01 DIAGNOSIS — N1831 Chronic kidney disease, stage 3a: Secondary | ICD-10-CM | POA: Diagnosis present

## 2024-09-01 DIAGNOSIS — C7981 Secondary malignant neoplasm of breast: Principal | ICD-10-CM | POA: Diagnosis present

## 2024-09-01 DIAGNOSIS — E875 Hyperkalemia: Secondary | ICD-10-CM | POA: Diagnosis present

## 2024-09-01 LAB — COMPREHENSIVE METABOLIC PANEL WITH GFR
ALT: 14 U/L (ref 0–44)
AST: 23 U/L (ref 15–41)
Albumin: 3.2 g/dL — ABNORMAL LOW (ref 3.5–5.0)
Alkaline Phosphatase: 113 U/L (ref 38–126)
Anion gap: 10 (ref 5–15)
BUN: 34 mg/dL — ABNORMAL HIGH (ref 6–20)
CO2: 24 mmol/L (ref 22–32)
Calcium: 8.4 mg/dL — ABNORMAL LOW (ref 8.9–10.3)
Chloride: 103 mmol/L (ref 98–111)
Creatinine, Ser: 1.84 mg/dL — ABNORMAL HIGH (ref 0.44–1.00)
GFR, Estimated: 32 mL/min — ABNORMAL LOW (ref >60)
Glucose, Bld: 133 mg/dL — ABNORMAL HIGH (ref 70–99)
Potassium: 4.3 mmol/L (ref 3.5–5.1)
Sodium: 138 mmol/L (ref 135–145)
Total Bilirubin: 1 mg/dL (ref 0.0–1.2)
Total Protein: 5.7 g/dL — ABNORMAL LOW (ref 6.5–8.1)

## 2024-09-01 LAB — CBC
HCT: 27.1 % — ABNORMAL LOW (ref 36.0–46.0)
Hemoglobin: 8.3 g/dL — ABNORMAL LOW (ref 12.0–15.0)
MCH: 29.7 pg (ref 26.0–34.0)
MCHC: 30.6 g/dL (ref 30.0–36.0)
MCV: 97.1 fL (ref 80.0–100.0)
Platelets: 58 K/uL — ABNORMAL LOW (ref 150–400)
RBC: 2.79 MIL/uL — ABNORMAL LOW (ref 3.87–5.11)
RDW: 19 % — ABNORMAL HIGH (ref 11.5–15.5)
WBC: 1.3 K/uL — CL (ref 4.0–10.5)
nRBC: 3.1 % — ABNORMAL HIGH (ref 0.0–0.2)

## 2024-09-01 LAB — MRSA NEXT GEN BY PCR, NASAL: MRSA by PCR Next Gen: NOT DETECTED

## 2024-09-01 LAB — AMMONIA: Ammonia: 30 umol/L (ref 9–35)

## 2024-09-01 LAB — PROTIME-INR
INR: 1.1 (ref 0.8–1.2)
Prothrombin Time: 15.3 s — ABNORMAL HIGH (ref 11.4–15.2)

## 2024-09-01 MED ORDER — SODIUM CHLORIDE 0.9% FLUSH: 10.0000 mL | Freq: Two times a day (BID) | INTRAVENOUS | Status: DC

## 2024-09-01 MED ORDER — ALBUTEROL SULFATE (2.5 MG/3ML) 0.083% IN NEBU: 2.5000 mg | INHALATION_SOLUTION | RESPIRATORY_TRACT | Status: DC | PRN

## 2024-09-01 MED ORDER — ONDANSETRON HCL 4 MG/2ML IJ SOLN: 4.0000 mg | Freq: Four times a day (QID) | INTRAMUSCULAR | Status: DC | PRN

## 2024-09-01 MED ORDER — SODIUM CHLORIDE 0.9% FLUSH: 10.0000 mL | INTRAVENOUS | Status: DC | PRN

## 2024-09-01 MED ORDER — ONDANSETRON HCL 4 MG PO TABS: 4.0000 mg | ORAL_TABLET | Freq: Four times a day (QID) | ORAL | Status: DC | PRN

## 2024-09-01 MED ORDER — ACETAMINOPHEN 650 MG RE SUPP: 650.0000 mg | Freq: Four times a day (QID) | RECTAL | Status: DC | PRN

## 2024-09-01 MED ORDER — VANCOMYCIN HCL 750 MG/150ML IV SOLN
750.0000 mg | INTRAVENOUS | Status: DC
  Administered 2024-09-02: 750 mg via INTRAVENOUS
  Filled 2024-09-01: qty 150

## 2024-09-01 MED ORDER — CAMPHOR-MENTHOL 0.5-0.5 % EX LOTN
TOPICAL_LOTION | CUTANEOUS | Status: DC | PRN
Start: 2024-09-01 — End: 2024-09-03
  Filled 2024-09-01: qty 222

## 2024-09-01 MED ORDER — PIPERACILLIN-TAZOBACTAM 3.375 G IVPB
3.3750 g | Freq: Three times a day (TID) | INTRAVENOUS | Status: DC
  Administered 2024-09-01 – 2024-09-02 (×3): 3.375 g via INTRAVENOUS
  Filled 2024-09-01 (×3): qty 50

## 2024-09-01 MED ORDER — NALOXONE HCL 0.4 MG/ML IJ SOLN: 0.4000 mg | INTRAMUSCULAR | Status: DC | PRN

## 2024-09-01 MED ORDER — SODIUM CHLORIDE 0.9 % IV SOLN
500.0000 mg | Freq: Every day | INTRAVENOUS | Status: DC
  Filled 2024-09-01: qty 5

## 2024-09-01 MED ORDER — ACETAMINOPHEN 325 MG PO TABS: 650.0000 mg | ORAL_TABLET | Freq: Four times a day (QID) | ORAL | Status: DC | PRN

## 2024-09-01 MED ORDER — SODIUM CHLORIDE 0.9 % IV SOLN: 1.0000 g | Freq: Every day | INTRAVENOUS | Status: DC

## 2024-09-01 MED ORDER — SODIUM CHLORIDE 0.9 % IV SOLN: INTRAVENOUS | Status: DC

## 2024-09-01 MED ORDER — FENTANYL CITRATE (PF) 50 MCG/ML IJ SOSY: 12.5000 ug | PREFILLED_SYRINGE | INTRAMUSCULAR | Status: DC | PRN

## 2024-09-01 MED ORDER — TRAZODONE HCL 50 MG PO TABS: 25.0000 mg | ORAL_TABLET | Freq: Every evening | ORAL | Status: DC | PRN

## 2024-09-01 MED ORDER — CHLORHEXIDINE GLUCONATE CLOTH 2 % EX PADS
6.0000 | MEDICATED_PAD | Freq: Every day | CUTANEOUS | Status: DC
  Administered 2024-09-01: 6 via TOPICAL

## 2024-09-01 MED ORDER — OXYCODONE HCL 5 MG PO TABS
5.0000 mg | ORAL_TABLET | ORAL | Status: DC | PRN
  Administered 2024-09-01: 5 mg via ORAL
  Filled 2024-09-01 (×3): qty 1

## 2024-09-01 NOTE — H&P (Addendum)
 History and Physical  [name] FMW:[id number] DOB: 05/23/1971 DOA: 09/01/2024  PCP: [name], MD   Chief Complaint: Right breast bleeding  HPI: Jessica Simpson is a 53 y.o. female with medical history significant for recurrent breast cancer stage IV under the care of Dr. Odean who is being admitted to the hospital service at [facility] in transfer from [facility] due to bleeding fungating right sided breast mass, and associated pneumonia and pleural effusion.  History is provided mainly by the patient's fianc who is at the bedside, as well as her daughter who was noted to be her primary caregiver.  The patient's brother is also present at the bedside.  They tell me that she has had this known fungating breast mass, followed here at the [facility] cancer center and last received chemotherapy 10/29.  They do seem to understand that her prognosis is not good, and that she does not have many treatment options available, if any.  In any case, she seems to have been stable until about 11 PM last night, when she had sudden onset of profuse bleeding from her right breast with fungating mass.  She was admitted to [facility].  Prior to this, she had a dressing in place and fianc describes some thin pink material that would ooze out of it.  She never had frank bleeding prior to this.  He denies any cough, shortness of breath, patient has had increasing pain in the right breast.  Per signout, patient was hemodynamically stable with normal vital signs at the outside hospital, was found to have hemoglobin of 6.1 and was transfused 2 units of PRBC prior to transfer to [facility] this morning.  Currently the patient is quite somnolent, family states that she was given IV fentanyl  just prior to hospital transfer.  RN at the bedside also states that the patient was noted on signout to be awake alert and oriented and show she received narcotics and since then has been sleepy.  On my examination  of the patient, she does awaken to voice, follows commands and is moving all her extremities, she states that she has some right sided breast pain but has no other complaints.  Review of Systems: Please see HPI for pertinent positives and negatives. A complete 10 system review of systems are otherwise negative.  Past Medical History:  Diagnosis Date   BRCA1 negative 10/22/2011   BRCA2 negative 10/22/2011   Breast cancer, IDC, Left, Stage III, Triple negative 06/30/2011   Breast disorder    Contraceptive education 01/15/2016   Cough    Dyspnea    Family history of breast cancer    Fracture    right 4th toe   History of breast cancer 07/22/2014   History of radiation therapy 05/16/18- 06/22/18   Right Breast/ 50.4 Gy in 28 fractions. Right posterior axilla and SCV nodes/ 50.4 Gy in 28 fractions.    History of radiation therapy    04/16/24 - 04/30/24 Dr. Lauraine Golden (Breast)   Hypertension    Lymphedema 06/15/2012   Lymphedema of arm    Papanicolaou smear of cervix with positive high risk human papilloma virus (HPV) test 12/25/2020   12/19/2020 repeat pap in 1 year per ASCCP guidelines, 5 year CIN3+risk is 2.25%   Personal history of chemotherapy    Personal history of radiation therapy    Positive fecal occult blood test 07/22/2014   Presence of permanent cardiac pacemaker    S/P radiation therapy 2013   50  gray in 25 fractions to the left breast, supraclavicular, and axillary regions. She then received a boost to the left lumpectomy of 10 gray in 5 fractions. This was given at [facility]-  .   Sleep apnea    Status post chemotherapy Comp. 11/23/11   FEC and Taxotere    Past Surgical History:  Procedure Laterality Date   anal ascess     turned into a fistula with extensive treatment   AXILLARY LYMPH NODE BIOPSY Right 04/06/2018   AXILLARY LYMPH NODE DISSECTION Right 04/06/2018   Procedure: RIGHT AXILLARY LYMPH NODE DISSECTION;  Surgeon: Ebbie Cough, MD;  Location: [facility] OR;  Service: General;  Laterality: Right;   BREAST BIOPSY Left 2012   BREAST BIOPSY Right 2019   BREAST LUMPECTOMY Left 2012   BREAST LUMPECTOMY Right 2019   rt axilla   BREAST SURGERY     CARDIAC ELECTROPHYSIOLOGY MAPPING AND ABLATION     CESAREAN SECTION     COLONOSCOPY N/A 08/14/2014   Procedure: COLONOSCOPY;  Surgeon: Claudis RAYMOND Rivet, MD;  Location: [facility];  Service: Endoscopy;  Laterality: N/A;  930   ERCP N/A 08/07/2024   Procedure: ERCP, WITH INTERVENTION IF INDICATED;  Surgeon: Rosalie Kitchens, MD;  Location: [facility] ENDOSCOPY;  Service: Gastroenterology;  Laterality: N/A;   EVACUATION BREAST HEMATOMA  12/21/2011   Procedure: EVACUATION HEMATOMA BREAST;  Surgeon: Jina Nephew, MD;  Location: [facility] OR;  Service: General;  Laterality: Left;   HAND SURGERY     tumor on finger, left hand   INCISION AND DRAINAGE ABSCESS ANAL     x3   IR IMAGING GUIDED PORT INSERTION  08/01/2023   left breast biopsy with axillary biopsy     core bx   PORT-A-CATH REMOVAL  12/21/2011   Procedure: REMOVAL PORT-A-CATH;  Surgeon: Sherlean JINNY Laughter, MD;  Location: [facility];  Service: General;  Laterality: N/A;   PORTACATH PLACEMENT  08/02/2011   dr Laughter PINON PLACEMENT Right 11/14/2017   Procedure: INSERTION PORT-A-CATH WITH US ;  Surgeon: Ebbie Cough, MD;  Location: [facility] OR;  Service: General;  Laterality: Right;   removal breast mass  2004   benign   TREATMENT FISTULA ANAL     TUMOR REMOVAL     on left hand   TUMOR REMOVAL     rt. eye   Social History:  reports that she has never smoked. She has never used smokeless tobacco. She reports that she does not drink alcohol and does not use drugs.  Allergies  Allergen Reactions   Codeine Nausea And Vomiting   Hydrocodone-Acetaminophen  Itching, Nausea And Vomiting and Other (See Comments)    NO Lortab    Family History  Problem Relation Age of Onset   Cancer Father 66       lung cancer     Cancer Maternal Grandmother        breast   Cancer Maternal Grandfather    Breast cancer Other        MGMs mother   Colon cancer Neg Hx      Prior to Admission medications   Medication Sig Start Date End Date Taking? Authorizing Provider  albuterol  (ACCUNEB ) 0.63 MG/3ML nebulizer solution Take 3 mLs (0.63 mg total) by nebulization every 6 (six) hours as needed for wheezing. 12/06/23   Gudena, Vinay, MD  amLODipine  (NORVASC ) 5 MG tablet Take 10 mg by mouth daily. 01/05/16   [provider]  Cyanocobalamin  (VITAMIN B-12) 5000 MCG TBDP Take  5,000 mcg by mouth daily.    [provider]  diphenoxylate -atropine  (LOMOTIL ) 2.5-0.025 MG tablet Take 1-2 tablets by mouth 4 (four) times daily as needed for diarrhea or loose stools. 12/27/23   Lanny Callander, MD  ibuprofen  (ADVIL ) 800 MG tablet TAKE 1 TABLET BY MOUTH EVERY 8 HOURS AS NEEDED 08/13/24   Odean Potts, MD  lidocaine -prilocaine  (EMLA ) cream Apply 1 Application topically as needed (as directed).    [provider]  loratadine  (CLARITIN ) 10 MG tablet Take 1 tablet (10 mg total) by mouth daily as needed for allergies. 02/07/24   Odean Potts, MD  LORazepam  (ATIVAN ) 1 MG tablet Take 1 tablet (1 mg total) by mouth every 8 (eight) hours. 01/17/24   Gudena, Vinay, MD  magnesium  oxide (MAG-OX) 400 MG tablet TAKE 1 TABLET BY MOUTH EVERY DAY 08/14/24   Causey, Lindsey Cornetto, NP  metoprolol  succinate (TOPROL -XL) 25 MG 24 hr tablet TAKE 1 TABLET (25 MG TOTAL) BY MOUTH DAILY. 08/27/24   Waddell Danelle ORN, MD  ondansetron  (ZOFRAN ) 8 MG tablet Take 1 tablet (8 mg total) by mouth 2 (two) times daily. 11/02/23   Gudena, Vinay, MD  oxyCODONE  (OXY IR/ROXICODONE ) 5 MG immediate release tablet Take 1 tablet (5 mg total) by mouth every 4 (four) hours as needed for severe pain (pain score 7-10). 07/31/24   Crawford Morna Pickle, NP  sulfamethoxazole -trimethoprim (BACTRIM DS) 800-160 MG tablet TAKE 1 TABLET BY MOUTH THREE TIMES A WEEK 08/27/24    Causey, Morna Pickle, NP  Vitamin D , Ergocalciferol , (DRISDOL ) 1.25 MG (50000 UNIT) CAPS capsule Take 1 capsule (50,000 Units total) by mouth once a week. 10/14/23   Crawford Morna Pickle, NP    Physical Exam: BP (!) 109/91 (BP Location: Left Leg)   Pulse (!) 109   Temp 98.5 F (36.9 C) (Oral)   LMP 09/19/2018   SpO2 93%  General: Patient is somnolent but arousable.  Multiple family members at the bedside.  Wearing 2 L nasal cannula oxygen.  Able to tell me her name, following commands and moving all extremities.  Overall patient is anasarcic, especially in the upper extremities where she has bilateral lymphedema. Cardiovascular: RRR, no murmurs or rubs, bilateral upper extremity lymphedema, bilateral lower extremity 1+ nonpitting edema up to the mid shin Respiratory: clear to auscultation bilaterally, no wheezes, no crackles on anterior exam Abdomen: soft, nontender, nondistended, normal bowel tones heard  Skin: dry, no rashes, she has a large dressing wrapped around her torso, with a bandage covering the area above her right breast.  The dressings were changed this morning prior to transport per her family.  The dressing seemed to be clean and dry, with no blood visible. Musculoskeletal: no joint effusions, normal range of motion  Psychiatric: appropriate affect, normal speech  Neurologic: extraocular muscles intact, clear speech, moving all extremities with intact sensorium         Labs on Admission:  Basic Metabolic Panel: No results for input(s): NA, K, CL, CO2, GLUCOSE, BUN, CREATININE, CALCIUM, MG, PHOS in the last 168 hours. Liver Function Tests: No results for input(s): AST, ALT, ALKPHOS, BILITOT, PROT, ALBUMIN in the last 168 hours. No results for input(s): LIPASE, AMYLASE in the last 168 hours. No results for input(s): AMMONIA in the last 168 hours. CBC: No results for input(s): WBC, NEUTROABS, HGB, HCT, MCV, PLT in the  last 168 hours. Cardiac Enzymes: No results for input(s): CKTOTAL, CKMB, CKMBINDEX, TROPONINI in the last 168 hours. BNP (last 3 results) No results for input(s):  BNP in the last 8760 hours.  ProBNP (last 3 results) No results for input(s): PROBNP in the last 8760 hours.  CBG: No results for input(s): GLUCAP in the last 168 hours.  Radiological Exams on Admission:  See images of CXR and CT chest reports from [facility] below       Assessment/Plan Hila Bolding is a 53 y.o. female with medical history significant for recurrent breast cancer stage IV under the care of Dr. Odean who is being admitted to the hospital service at [facility] in transfer from [facility] due to bleeding fungating right sided breast mass, and associated pneumonia and pleural effusion.  Bleeding fungating right breast mass, with associated acute blood loss anemia superimposed on chronic anemia-due to her stage IV breast cancer with known extension to the right nipple/skin.  CT scan reportedly done at [facility] shows a fungating mass penetrating through her chest wall and skin.  Currently patient is hemodynamically stable without evidence of active bleed.  She received 2 unit PRBC prior to transfer, for hemoglobin of 6.1. -Inpatient admission -Monitor closely on progressive -Check stat CBC, CMP, INR now and will plan to monitor hemoglobin every 8 hours -Discussed with Dr. Federico of medical oncology, to notify him of the patient's arrival -Discussed with Dr. Dewey from radiation oncology, inquiring about potential role for palliative radiation in the setting of bleeding -Palliative care consult, to discuss goals of care, CODE STATUS etc. -In case of recurrent bleed patient will need FFP, may also consider urgent IR evaluation and intervention  Acute respiratory failure-I suspect this is due to combination of left upper lobe consolidation seen on CT, as well as large right pleural effusion.   Patient is currently requiring 6 L nasal cannula oxygen.  Community-acquired pneumonia-in the setting of pancytopenia, and immune compromise from ongoing chemotherapy. -Empiric IV vancomycin and IV Zosyn  Large right pleural effusion-seen on CT scan prior to admission at [facility].  Potentially malignant effusion, or parapneumonic.  Discussed with IR, and request placed for therapeutic paracentesis.  Pancytopenia-noted, will follow-up labs and give blood and platelets as indicated  AKI-in the setting of acute infection, and anemia -Will avoid nephrotoxins and hydrate, avoid hypotension  Hypertension-hold home metoprolol , Norvasc   Anxiety-Ativan  as needed  OSA-CPAP at night  DVT prophylaxis: SCDs only due to thrombocytopenia and active bleed    Code Status: Full Code  Consults called: Oncology, radiation oncology, palliative care  Admission status: The appropriate patient status for this patient is INPATIENT. Inpatient status is judged to be reasonable and necessary in order to provide the required intensity of service to ensure the patient's safety. The patient's presenting symptoms, physical exam findings, and initial radiographic and laboratory data in the context of their chronic comorbidities is felt to place them at high risk for further clinical deterioration. Furthermore, it is not anticipated that the patient will be medically stable for discharge from the hospital within 2 midnights of admission.    I certify that at the point of admission it is my clinical judgment that the patient will require inpatient hospital care spanning beyond 2 midnights from the point of admission due to high intensity of service, high risk for further deterioration and high frequency of surveillance required  Time spent: 59 minutes  Abrahim Sargent CHRISTELLA Gail MD [facility] Pager [phone]  If 7PM-7AM, please contact night-coverage [email or web address] Password [email or web address]  09/01/2024, 1:01 PM

## 2024-09-01 NOTE — Progress Notes (Signed)
 Pharmacy Antibiotic Note  Jessica Simpson is a 53 y.o. female admitted on 09/01/2024 with pneumonia, wound infection.  Pharmacy has been consulted for Vancomycin and Zosyn dosing.  Pt is a direct admit from [facility]on with [facility] Pharmacist Amy to confirm meds given: Abx:  Zosyn 3.375 at 2:15 am, Vanc 2g at 2:35 am Others:  Fentanyl  50 mcg, Zofran , Protonix, TXA, NS bolus  SCr 1.15 (last on 10/27)>> increased to 1.84 (11/8)   Plan: Zosyn 3.375g IV Q8H infused over 4hrs.  Vancomycin 750 mg IV q24h  (SCr 1.84, Vd 0.5, est AUC 508)  Measure Vanc levels as needed.  Goal AUC = 400 - 550  Follow up renal function, culture results, and clinical course.      Temp (24hrs), Avg:98.5 F (36.9 C), Min:98.5 F (36.9 C), Max:98.5 F (36.9 C)  Recent Labs  Lab 09/01/24 1250  WBC 1.3*  CREATININE 1.84*    Estimated Creatinine Clearance: 64.8 mL/min (A) (by C-G formula based on SCr of 1.15 mg/dL (H)).    Allergies  Allergen Reactions   Codeine Nausea And Vomiting   Hydrocodone-Acetaminophen  Itching, Nausea And Vomiting and Other (See Comments)    NO Lortab    Antimicrobials this admission: 11/8 Zosyn >> 11/8 Vancomycin >>   Dose adjustments this admission:   Microbiology results:   Thank you for allowing pharmacy to be a part of this patient's care.  Wanda Hasting PharmD, BCPS [facility] main pharmacy [phone] 09/01/2024 1:02 PM

## 2024-09-01 NOTE — Consult Note (Addendum)
 WOC Nurse Consult Note: patient with known fungating malignant wound to R breast; WOC consulted on last admission; most recent photos in chart from 10/9; did request updated photos when current dressing removed  Reason for Consult:R breast bleeding mass  Wound type: full thickness malignant  Pressure Injury POA: NA  Measurement: see nursing flowsheet  Wound bed: black necrotic nipple with tan exudate to surrounding tissue  Drainage (amount, consistency, odor) see nursing flowsheet  Periwound: Dressing procedure/placement/frequency: No debridement is recommended to malignant wound.   Atraumatic dressings would be first choice, not clear if patients tumor has odor.    Cleanse R breast wound with Vashe, do not rinse and allow to air dry.  Apply xeroform to the wound bed daily,  top with ABD pad, secure with tape, however if limited use of tape can be considered, use mesh underwear, cut the seam and use like a breast binder/tube top to secure ABD and dressings in place.  May need to use two layers of mesh underwear.   PLEASE SOAK OLD DRESSING WITH COPIOUS NORMAL SALINE IF ADHERED TO WOUND BED FOR ATRAUMATIC REMOVAL.   POC discussed with bedside nurse. WOC team will not follow. Re-consult if further needs arise.   Thank you,    Powell Bar MSN, RN-BC, [employer]

## 2024-09-01 NOTE — Plan of Care (Signed)
 Patient seen and reexamined once again this afternoon.  Her fianc and brother as well as her daughter are still present.  Now present are also the patient's mother and sister who are at the bedside.  Patient remains somnolent, physical exam largely unchanged. Dressings still dry.  Detailed discussion with the multiple family members above regarding her poor prognosis, questionable future candidacy for chemotherapy, and most importantly CODE STATUS.    After much discussion, decision was made to keep her full code for the time being. I think family at the bedside has realistic expectations and understanding of the patient's poor clinical situation and very poor prognosis.  They asked good questions, and these were answered to their satisfaction.  Given continued somnolence, will check ammonia level and noncontrast head CT.  Bedside RN was updated.

## 2024-09-01 NOTE — Progress Notes (Signed)
 Date and time results received: 09/01/24 1307 (use smartphrase .now to insert current time)  Test: WBC  Critical Value: 1.3  Name of Provider Notified: Zella, Mir.  Orders Received? Or Actions Taken?: None at the moment.

## 2024-09-01 NOTE — Progress Notes (Signed)
   09/01/24 2245  BiPAP/CPAP/SIPAP  $ Non-Invasive Home Ventilator  Initial  $ Face Mask Medium Yes  BiPAP/CPAP/SIPAP Pt Type Adult  BiPAP/CPAP/SIPAP DREAMSTATIOND  Mask Type Full face mask  Dentures removed? Not applicable  Mask Size Medium  Respiratory Rate 20 breaths/min  Flow Rate 8 lpm  Patient Home Machine No  Patient Home Mask No  Patient Home Tubing No  Auto Titrate Yes  Minimum cmH2O 5 cmH2O  Maximum cmH2O 20 cmH2O  Nasal massage performed Yes  CPAP/SIPAP surface wiped down Yes  Device Plugged into RED Power Outlet Yes  BiPAP/CPAP /SiPAP Vitals  Resp 20  SpO2 95 %  Bilateral Breath Sounds Diminished

## 2024-09-01 NOTE — Progress Notes (Signed)
   09/01/24 1533  Assess: MEWS Score  Temp 98.9 F (37.2 C)  BP 127/74  MAP (mmHg) 71  Pulse Rate (!) 115  ECG Heart Rate (!) 18  Level of Consciousness Responds to Pain  SpO2 100 %  O2 Device Nasal Cannula  O2 Flow Rate (L/min) 6 L/min  Assess: MEWS Score  MEWS Temp 0  MEWS Systolic 0  MEWS Pulse 2  MEWS RR 0  MEWS LOC 2  MEWS Score 4  MEWS Score Color Red  Assess: if the MEWS score is Yellow or Red  Were vital signs accurate and taken at a resting state? Yes  Does the patient meet 2 or more of the SIRS criteria? No  MEWS guidelines implemented  Yes, red  Treat  MEWS Interventions Considered administering scheduled or prn medications/treatments as ordered  Take Vital Signs  Increase Vital Sign Frequency  Red: Q1hr x2, continue Q4hrs until patient remains green for 12hrs  Escalate  MEWS: Escalate Red: Discuss with charge nurse and notify provider. Consider notifying RRT. If remains red for 2 hours consider need for higher level of care  Notify: Charge Nurse/RN  Name of Charge Nurse/RN Notified Lonell Alstrom, RN  Provider Notification  Provider Name/Title Dr. Zella  Date Provider Notified 09/01/24  Time Provider Notified 1534  Method of Notification Page  Notification Reason Other (Comment) (alert to pain)  Provider response No new orders  Date of Provider Response 09/01/24  Time of Provider Response 1535  Assess: SIRS CRITERIA  SIRS Temperature  0  SIRS Respirations  0  SIRS Pulse 0  SIRS WBC 1  SIRS Score Sum  1

## 2024-09-02 ENCOUNTER — Inpatient Hospital Stay (HOSPITAL_COMMUNITY)

## 2024-09-02 DIAGNOSIS — N179 Acute kidney failure, unspecified: Secondary | ICD-10-CM

## 2024-09-02 DIAGNOSIS — D500 Iron deficiency anemia secondary to blood loss (chronic): Secondary | ICD-10-CM | POA: Diagnosis not present

## 2024-09-02 DIAGNOSIS — C7931 Secondary malignant neoplasm of brain: Secondary | ICD-10-CM

## 2024-09-02 DIAGNOSIS — J9601 Acute respiratory failure with hypoxia: Secondary | ICD-10-CM

## 2024-09-02 DIAGNOSIS — D649 Anemia, unspecified: Secondary | ICD-10-CM

## 2024-09-02 DIAGNOSIS — Z515 Encounter for palliative care: Secondary | ICD-10-CM

## 2024-09-02 DIAGNOSIS — R451 Restlessness and agitation: Secondary | ICD-10-CM

## 2024-09-02 DIAGNOSIS — C50919 Malignant neoplasm of unspecified site of unspecified female breast: Secondary | ICD-10-CM

## 2024-09-02 DIAGNOSIS — Z66 Do not resuscitate: Secondary | ICD-10-CM

## 2024-09-02 DIAGNOSIS — Z79899 Other long term (current) drug therapy: Secondary | ICD-10-CM

## 2024-09-02 DIAGNOSIS — Z7189 Other specified counseling: Secondary | ICD-10-CM

## 2024-09-02 LAB — CBC
HCT: 24.2 % — ABNORMAL LOW (ref 36.0–46.0)
Hemoglobin: 7.2 g/dL — ABNORMAL LOW (ref 12.0–15.0)
MCH: 29.9 pg (ref 26.0–34.0)
MCHC: 29.8 g/dL — ABNORMAL LOW (ref 30.0–36.0)
MCV: 100.4 fL — ABNORMAL HIGH (ref 80.0–100.0)
Platelets: 59 K/uL — ABNORMAL LOW (ref 150–400)
RBC: 2.41 MIL/uL — ABNORMAL LOW (ref 3.87–5.11)
RDW: 20.5 % — ABNORMAL HIGH (ref 11.5–15.5)
WBC: 1.7 K/uL — ABNORMAL LOW (ref 4.0–10.5)
nRBC: 4.7 % — ABNORMAL HIGH (ref 0.0–0.2)

## 2024-09-02 LAB — BODY FLUID CELL COUNT WITH DIFFERENTIAL
Eos, Fluid: 0 %
Lymphs, Fluid: 18 %
Monocyte-Macrophage-Serous Fluid: 81 % (ref 50–90)
Neutrophil Count, Fluid: 1 % (ref 0–25)
Total Nucleated Cell Count, Fluid: 245 uL (ref 0–1000)

## 2024-09-02 LAB — BASIC METABOLIC PANEL WITH GFR
Anion gap: 11 (ref 5–15)
BUN: 37 mg/dL — ABNORMAL HIGH (ref 6–20)
CO2: 24 mmol/L (ref 22–32)
Calcium: 8.1 mg/dL — ABNORMAL LOW (ref 8.9–10.3)
Chloride: 103 mmol/L (ref 98–111)
Creatinine, Ser: 2.72 mg/dL — ABNORMAL HIGH (ref 0.44–1.00)
GFR, Estimated: 20 mL/min — ABNORMAL LOW (ref >60)
Glucose, Bld: 122 mg/dL — ABNORMAL HIGH (ref 70–99)
Potassium: 5.1 mmol/L (ref 3.5–5.1)
Sodium: 138 mmol/L (ref 135–145)

## 2024-09-02 MED ORDER — HALOPERIDOL LACTATE 5 MG/ML IJ SOLN: 0.5000 mg | INTRAMUSCULAR | Status: DC | PRN

## 2024-09-02 MED ORDER — HYDROMORPHONE HCL 1 MG/ML IJ SOLN
0.5000 mg | INTRAMUSCULAR | Status: DC | PRN
  Administered 2024-09-02 (×3): 0.5 mg via INTRAVENOUS
  Filled 2024-09-02 (×3): qty 1

## 2024-09-02 MED ORDER — LIDOCAINE-EPINEPHRINE (PF) 2 %-1:200000 IJ SOLN
INTRAMUSCULAR | Status: AC
  Filled 2024-09-02: qty 20

## 2024-09-02 MED ORDER — GLYCOPYRROLATE 0.2 MG/ML IJ SOLN
0.2000 mg | INTRAMUSCULAR | Status: DC | PRN
Start: 2024-09-02 — End: 2024-09-03
  Administered 2024-09-02: 0.2 mg via INTRAVENOUS
  Filled 2024-09-02: qty 1

## 2024-09-02 MED ORDER — LORAZEPAM 2 MG/ML IJ SOLN: 1.0000 mg | INTRAMUSCULAR | Status: DC | PRN

## 2024-09-02 MED ORDER — SODIUM CHLORIDE 0.9 % IV SOLN: INTRAVENOUS | Status: DC

## 2024-09-02 MED ORDER — POLYVINYL ALCOHOL 1.4 % OP SOLN: 1.0000 [drp] | Freq: Four times a day (QID) | OPHTHALMIC | Status: DC | PRN

## 2024-09-02 MED ORDER — BIOTENE DRY MOUTH MT LIQD: 15.0000 mL | OROMUCOSAL | Status: DC | PRN

## 2024-09-03 NOTE — Progress Notes (Signed)
 Pt transported to morgue by attendant and [facility]. SRP, RN

## 2024-09-05 LAB — BODY FLUID CULTURE W GRAM STAIN
Culture: NO GROWTH
Gram Stain: NONE SEEN

## 2024-09-12 ENCOUNTER — Inpatient Hospital Stay: Admitting: Hematology and Oncology

## 2024-09-12 ENCOUNTER — Inpatient Hospital Stay

## 2024-09-24 NOTE — Progress Notes (Signed)
 At 1930, rounding to assess pt status, family at bedside; notified by family pt has a sudden change in breathing. Patient heart and lung sounds are absent. No carotid, apical, radial, femoral, pedal pulses present. ALL pulses absent. No cardiac or respiratory activity confirmed. Verified by CN, RN--Kristine Ray, [facility]. NP Lynwood Kipper notified and updated.   Official time of death pronounced 1930 on 09/08/2024, by Marylynn Dales, RN and Mickel Ray, RN.  Aster Eckrich R. Dales, RN, BSN-BC

## 2024-09-24 NOTE — Progress Notes (Signed)
    OVERNIGHT PROGRESS REPORT  Notified by RN that patient is deceased as of 1930 Hrs.  Patient was DNR/CMO (Comfort Measures Only) followed by Palliative Care  2 RN verified.  Family is in room at time.  Lynwood Kipper MSNA MSN ACNPC-AG Acute Care Nurse Practitioner [facility]

## 2024-09-24 NOTE — Consult Note (Signed)
 Consultation Note Date: 10-Sep-2024   Patient Name: Jessica Simpson  DOB: Oct 25, 1971  MRN: [id number]  Age / Sex: 53 y.o., female   PCP: Jessica Clem Pitts, MD Referring Physician: Christobal Guadalajara, MD  Reason for Consultation: Establishing goals of care     Chief Complaint/History of Present Illness:   Patient is a 53 year old female with a past medical history of now stage IV recurrent breast cancer, CKD stage III, anxiety, hypertension, and OSA who was transferred on 09/01/2024 from [place], [place] for management of bleeding fungating right sided breast mass, pneumonia, and pleural effusion.  Patient had last received chemotherapy on 08/22/2024.  During admission, patient's medical and mental status has continued to deteriorate while receiving management for bleeding fungating right breast mass, acute blood loss anemia on chronic anemia of malignancy, stage IV breast cancer, acute respiratory failure with hypoxia in setting of large right pleural effusion and CAP, and AKI on CKD.  Oncology and IR consulted for recommendations.  Palliative medicine team consulted to assist with complex medical decision making.  Extensive review of EMR including recent documentation from hospitalist and IR.  Also reviewed outpatient oncology documentation.  Patient underwent right-sided thoracentesis today noting 0.6 L of clear, golden yellow pleural fluid.  Review of recent CMP noting creatinine worsening to 2.72 with estimated GFR of 20.  CBC reviewed noting leukopenia at 1.7, hemoglobin of 7.2, and platelets of 59.  Discussed care with hospitalist, oncologist, and bedside RN to coordinate care.  Hospitalist ordered further imaging this morning due to patient's mentation.  Head CT with contrast personally reviewed noting mass in the posterior right hemisphere highly suspicious for metastatic disease, indeterminate small right cerebellar infarct versus edema.  Patient's significant other and daughter have been  updated regarding these results.  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 Individuals present for the conversation: Patient unable to participate in complex medical decision making due to underlying medical and mental status.  This provider discussed care extensively with patient's significant other, Jessica Simpson, and patient's daughter, Jessica Simpson.  Summary of the conversation:  Presented bedside to meet with patient and family.  Patient sleeping in bed unresponsive to voice or touch.  Able to meet with patient's fianc, Jessica, daughter, Jessica Simpson), at bedside. Introduced myself as a member of the palliative medicine team and my role in patient's medical journey.   Inquired about other family support and they noted that multiple family members would be present later in day to visit with patient.  Jessica is patient's only daughter and has been assisting with making medical decisions with the support of patient's fianc.  Able to discuss patient's medical illnesses at this time.  Family has heard severity of medical illness.  Discussed pathways for medical care moving forward knowing that patient has stage IV breast cancer which has progressed through multiple therapies and cannot be fixed.  Discussed possibility of transitioning to comfort focused care at this time.  Detailed what comfort focused care would and would not entail such as discontinuing interventions such as IV fluids, antibiotics, lab work, and imaging and instead focusing on patient's symptom management at  end-of-life such as managing pain, nausea/vomiting, and agitation.  Spent time answering questions regarding pathways for care moving forward.  Jessica Simpson and daughter supporting transition to comfort focused care at this time to focus on symptom management at end-of-life.  Also explained with transition to full comfort focused care, patient's CODE STATUS would be a appropriately change to DNR/DNI which they support.  Did discuss possibility of potential hospice support though patient could likely not make journey back to [facility]  and family cannot support care at home with hospice.  Discussed idea of inpatient hospice though family very reluctant to have patient moved at all and would like to focus on comfort focused care here.  Did discuss that this may need to be continuously evaluated though would appropriately start patient on comfort focused care at this time.  Spent time providing emotional support via active listening.  Noted palliative medicine team to continue to follow along with patient's medical journey.  Outcome of the conversations and/or documents completed:  Transition to full comfort focused care at this time.  Appropriately changing CODE STATUS to DNR/DNI.  I spent 30 minutes providing separately identifiable ACP services with the patient and/or surrogate decision maker in a voluntary, in-person conversation discussing the patient's wishes and goals as detailed in the above note.  Jessica Radar, DO Palliative Medicine Provider  -------------------------------------------------------------------------------------------------------------  Discussed care with team including hospitalist, bedside RN, and oncologist to coordinate transition to comfort focused care.  Primary Diagnoses  Present on Admission:  Blood loss anemia   Past Medical History:  Diagnosis Date   BRCA1 negative 10/22/2011   BRCA2 negative 10/22/2011   Breast cancer, IDC, Left, Stage III, Triple negative  06/30/2011   Breast disorder    Contraceptive education 01/15/2016   Cough    Dyspnea    Family history of breast cancer    Fracture    right 4th toe   History of breast cancer 07/22/2014   History of radiation therapy 05/16/18- 06/22/18   Right Breast/ 50.4 Gy in 28 fractions. Right posterior axilla and SCV nodes/ 50.4 Gy in 28 fractions.    History of radiation therapy    04/16/24 - 04/30/24 Dr. 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 She then received a boost to the left lumpectomy of 10 gray in 5 fractions. This was given at [facility] .   Sleep apnea    Status post chemotherapy Comp. 11/23/11   FEC and Taxotere    Social History   Socioeconomic History   Marital status: Single    Spouse name: Not on file   Number of children: Not on file   Years of education: Not on file   Highest education level: Not on file  Occupational History   Not on file  Tobacco Use   Smoking status: Never   Smokeless tobacco: Never  Vaping Use   Vaping status: Never Used  Substance and Sexual Activity   Alcohol use: No   Drug use: No   Sexual activity: Yes    Birth control/protection: Post-menopausal  Other Topics Concern  Not on file  Social History Narrative   Not on file   Social Drivers of Health   Financial Resource Strain: Low Risk  (01/26/2022)   Overall Financial Resource Strain (CARDIA)    Difficulty of Paying Living Expenses: Not very hard  Food  Insecurity: Patient Unable To Answer (09/01/2024)   Hunger Vital Sign    Worried About Running Out of Food in the Last Year: Patient unable to answer    Ran Out of Food in the Last Year: Patient unable to answer  Transportation Needs: Patient Unable To Answer (09/01/2024)   PRAPARE - Transportation    Lack of Transportation (Medical): Patient unable to answer    Lack of Transportation (Non-Medical): Patient unable to answer  Physical Activity: Insufficiently Active (01/26/2022)   Exercise Vital Sign    Days of Exercise per Week: 3 days    Minutes of Exercise per Session: 30 min  Stress: No Stress Concern Present (01/26/2022)   [employer] of Occupational Health - Occupational Stress Questionnaire    Feeling of Stress : Only a little  Social Connections: Moderately Integrated (01/26/2022)   Social Connection and Isolation Panel    Frequency of Communication with Friends and Family: More than three times a week    Frequency of Social Gatherings with Friends and Family: Once a week    Attends Religious Services: More than 4 times per year    Active Member of Clubs or Organizations: Yes    Attends Engineer, Structural: More than 4 times per year    Marital Status: Never married   Family History  Problem Relation Age of Onset   Cancer Father 9       lung cancer    Cancer Maternal Grandmother        breast   Cancer Maternal Grandfather    Breast cancer Other        MGMs mother   Colon cancer Neg Hx    Scheduled Meds:  Chlorhexidine  Gluconate Cloth  6 each Topical Daily   sodium chloride  flush  10-40 mL Intracatheter Q12H   Continuous Infusions:  sodium chloride  100 mL/hr at 09/01/24 1621   piperacillin-tazobactam (ZOSYN)  IV 3.375 g (September 26, 2024 0530)   vancomycin     PRN Meds:.acetaminophen  **OR** acetaminophen , albuterol , camphor-menthol, fentaNYL  (SUBLIMAZE ) injection, naLOXone (NARCAN)  injection, ondansetron  **OR** ondansetron  (ZOFRAN ) IV, oxyCODONE , sodium chloride   flush, traZODone Allergies  Allergen Reactions   Codeine Nausea And Vomiting   Hydrocodone-Acetaminophen  Itching, Nausea And Vomiting and Other (See Comments)    NO Lortab   CBC:    Component Value Date/Time   WBC 1.7 (L) 26-Sep-2024 0326   HGB 7.2 (L) September 26, 2024 0326   HGB 8.1 (L) 08/20/2024 1344   HGB 12.2 07/26/2016 1650   HCT 24.2 (L) 26-Sep-2024 0326   HCT 37.5 07/26/2016 1650   PLT 59 (L) 09-26-24 0326   PLT 89 (L) 08/20/2024 1344   PLT 294 07/26/2016 1650   MCV 100.4 (H) 2024-09-26 0326   MCV 80 07/26/2016 1650   NEUTROABS 1.9 08/20/2024 1344   LYMPHSABS 0.2 (L) 08/20/2024 1344   MONOABS 0.2 08/20/2024 1344   EOSABS 0.0 08/20/2024 1344   BASOSABS 0.1 08/20/2024 1344   Comprehensive Metabolic Panel:    Component Value Date/Time   NA 138 09/26/24 0326   NA 140 07/26/2016 1650   K 5.1 26-Sep-2024 0326   CL 103 09-26-24 0326   CO2 24 26-Sep-2024 0326   BUN 37 (H) 09-26-2024 0326  BUN 13 07/26/2016 1650   CREATININE 2.72 (H) 09/26/24 0326   CREATININE 1.15 (H) 08/20/2024 1344   GLUCOSE 122 (H) 09/26/2024 0326   CALCIUM 8.1 (L) September 26, 2024 0326   AST 23 09/01/2024 1250   AST 20 08/20/2024 1344   ALT 14 09/01/2024 1250   ALT 18 08/20/2024 1344   ALKPHOS 113 09/01/2024 1250   BILITOT 1.0 09/01/2024 1250   BILITOT 1.8 (H) 08/20/2024 1344   PROT 5.7 (L) 09/01/2024 1250   PROT 7.4 07/26/2016 1650   ALBUMIN 3.2 (L) 09/01/2024 1250   ALBUMIN 4.2 07/26/2016 1650    Physical Exam: Vital Signs: BP (!) 116/53 (BP Location: Left Leg)   Pulse (!) 101   Temp 98 F (36.7 C) (Oral)   Resp 16   Ht 5' 6 (1.676 m)   Wt 92.5 kg   LMP 09/19/2018   SpO2 100%   BMI 32.91 kg/m  SpO2: SpO2: 100 % O2 Device: O2 Device: CPAP O2 Flow Rate: O2 Flow Rate (L/min): 6 L/min Intake/output summary:  Intake/Output Summary (Last 24 hours) at Sep 26, 2024 0741 Last data filed at 2024-09-26 0300 Gross per 24 hour  Intake 1721.84 ml  Output 0 ml  Net 1721.84 ml   LBM: Last BM  Date :  (PTA) Baseline Weight: Weight: 92.5 kg Most recent weight: Weight: 92.5 kg  General: Unresponsive, ill-appearing Cardiovascular: Tachycardia noted Respiratory: no increased work of breathing noted, not in respiratory distress Neuro: Unresponsive         Palliative Performance Scale: 10%              Additional Data Reviewed: Recent Labs    09/01/24 1250 09-26-2024 0326  WBC 1.3* 1.7*  HGB 8.3* 7.2*  PLT 58* 59*  NA 138 138  BUN 34* 37*  CREATININE 1.84* 2.72*    Imaging: DG ERCP CLINICAL DATA:  ERCP  EXAM: ERCP 16 fluoroscopy images of the right upper quadrant  TECHNIQUE: Multiple spot images obtained with the fluoroscopic device and submitted for interpretation post-procedure.  FLUOROSCOPY: Radiation Exposure Index (as provided by the fluoroscopic device): 206.6 mGy Kerma  COMPARISON:  None Available.  FINDINGS: Fluoroscopic views of the right upper quadrant demonstrate flexible endoscopy device with cannulation of the common bile duct. Contrast injected demonstrates proximal common bile duct dilatation which is irregular and distal half to the 1/3 of the common bile duct demonstrates significant stricture. Balloon sweep of the common bile duct. Interval placement of temporary plastic stent in the common bile duct.  IMPRESSION: ERCP with plastic stent placement in the common bile duct.  These images were submitted for radiologic interpretation only. Please see the procedural report for the amount of contrast and the fluoroscopy time utilized.  Electronically Signed   By: Cordella Banner   On: 08/11/2024 17:26    I personally reviewed recent imaging.   Palliative Care Assessment and Plan Summary of Established Goals of Care and Medical Treatment Preferences   Patient is a 53 year old female with a past medical history of now stage IV recurrent breast cancer, CKD stage III, anxiety, hypertension, and OSA who was transferred on 09/01/2024 from  [place], [place] for management of bleeding fungating right sided breast mass, pneumonia, and pleural effusion.  Patient had last received chemotherapy on 08/22/2024.  During admission, patient's medical and mental status has continued to deteriorate while receiving management for bleeding fungating right breast mass, acute blood loss anemia on chronic anemia of malignancy, stage IV breast cancer, acute respiratory failure with hypoxia in  setting of large right pleural effusion and CAP, and AKI on CKD.  Oncology and IR consulted for recommendations.  Palliative medicine team consulted to assist with complex medical decision making.  # Complex medical decision making/goals of care  -Patient unable to participate in medical decision making secondary to medical status.  -Spoke with patient's fianc and daughter as detailed extensively above in HPI.  Discussed patient's multiple medical illnesses including recurrent breast cancer which has not responded to cancer directed therapies and now likely disease to brain.  After discussing pathways, appropriately transitioning to full comfort focused care at this time.  Other family members planning to visit today.  Discussed possibility of hospice support in the outpatient setting though patient cannot receive home hospice support at home as per family and family do not want patient transferred to inpatient hospice.  Continuing comfort focused care in the hospital at this time though have discussed may need to revisit this subject based on evaluation to determine if appropriate for inpatient hospice transfer.  At this time hospital death is anticipated.  Palliative medicine team continuing to follow along with patient's medical journey.  -At this time we will discontinue interventions that are no longer focused on comfort such as IV fluids, imaging, or lab work.  Will instead focus on symptom management of pain, dyspnea, and agitation in the setting of end-of-life care.     Code Status: Do not attempt resuscitation (DNR) - Comfort care  # Symptom management Patient is receiving these palliative interventions for symptom management with an intent to improve quality of life.     -Pain/Dyspnea, acute in the setting of end-of-life care                               -Start IV Dilaudid  0.5-2 mg IV every 30 minutes as needed.  Continue to adjust based on patient's symptom burden.  If patient needing frequent dosing, may need to consider continuous infusion.    - Not using morphine  in setting of AKI                  -Anxiety/agitation, in the setting of end-of-life care                               -Start IV Ativan  1 mg every 4 hours as needed. Continue to adjust based on patient's symptom burden.                                 -Start IV Haldol 0.5 mg every 4 hours as needed. Continue to adjust based on patient's symptom burden.                   -Secretions, in the setting of end-of-life care                               -Start IV glycopyrrolate 0.2 mg every 4 hours as needed.  # Psycho-social/Spiritual Support:  - Support System: Jessica Simpson, daughter - Consulted chaplain for support  # Discharge Planning:  Anticipated Hospital Death  Thank you for allowing the palliative care team to participate in the care [name].  Jessica Radar, DO Palliative Care Provider PMT # [phone]  If patient remains symptomatic despite maximum doses,  please call PMT at [phone] between 0700 and 1900. Outside of these hours, please call attending, as PMT does not have night coverage.  Billing based on MDM: High  Problems Addressed: One acute or chronic illness or injury that poses a threat to life or bodily function  Amount and/or Complexity of Data: Category 1:Review of prior external note(s) from each unique source, Review of the result(s) of each unique test, and Assessment requiring an independent historian(s), Category 2:Independent interpretation of a  test performed by another physician/other qualified health care professional (not separately reported), and Category 3:Discussion of management or test interpretation with external physician/other qualified health care professional/appropriate source (not separately reported)  Risks: Parenteral controlled substances

## 2024-09-24 NOTE — Death Summary Note (Signed)
 DEATH SUMMARY   Patient Details  Name: Jessica Simpson MRN: [id number] DOB: 08-23-1971 ERE:[id number], Clem Pitts, MD Admission/Discharge Information   Admit Date:  09/21/24  Date of Death: Date of Death: Sep 22, 2024  Time of Death: Time of Death: 1930  Length of Stay: 1   Principle Cause of death: Metastatic breast cancer  Hospital Diagnoses: Principal Problem:   Malignant neoplasm metastatic to brain [facility]) Active Problems:   Blood loss anemia   Acute hypoxic respiratory failure (HCC)   Acute on chronic anemia   AKI (acute kidney injury)   DNR (do not resuscitate)   High risk medication use   Agitation   Medication management   End of life care   Counseling and coordination of care   Malignant neoplasm of female breast (HCC)   ACP (advance care planning)   Palliative care encounter   Hospital Course: [name] is a 53 y.o. female with PMH of recurrent breast cancer stage IV under the care of Dr. Odean transferred from [facility] due to bleeding fungating right sided breast mass, and associated pneumonia and pleural effusion. She last received chemotherapy 10/29 and family aware of her poor prognosis and no option for further treatment. she had sudden onset of profuse bleeding from her right breast with fungating mass, admitted to [facility].  Prior to this, she had a dressing in place and fianc describes some thin pink material that would ooze out of it, was found to have hemoglobin of 6.1 and was transfused 2 units of PRBC prior to transfer to [facility] 09-21-24. On admission patient was somnolent, CODE STATUS was discussed and remain full code understanding of poor prognosis CT scan reportedly done at [facility] shows a fungating mass penetrating through her chest wall and skin.  Oncology- Dr. Rodolph Dr. Dewey from rad-onc and PMT consulted Patient has been confused lethargic on admission likely multifactorial  CT  head: Vague hyperdense  mass-like area in the posterior right hemisphere (1.6 cm) is Highly suspicious for metastatic disease to the brain. Furthermore, findings Indeterminate for a small age-indeterminate right cerebellar infarct versus cerebellar edema from an occult metastasis there. No significant intracranial mass effect.Brain MRI without and with contrast would best characterize further. Family updated about the results. Further workup showed worsening of renal failure, ongoing severe encephalopathy Patient's primary oncology notified and palliative care consulted and subsequently transitioned to hospice, and patient's primary oncology was in agreement Patient patient passed away peacefully in-hospital  Assessment and plan:  Bleeding fungating right breast mass ABLA on chronic anemia of malignancy Stage IV breast cancer-: Acute respiratory failure with hypoxia CAP/left upper lobe consolidation seen on CT Large right pleural effusion Immunocompromised in the setting of chemotherapy Acute metabolic/toxic encephalopathy 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  Consultations:  Oncology Palliative care  The results of significant diagnostics from this hospitalization (including imaging, microbiology, ancillary and laboratory) are listed below for reference.   Significant Diagnostic Studies: CT HEAD WO CONTRAST ( ) Addendum Date: 09/03/2024 ADDENDUM: Study and MRI recommendation discussed by telephone with Nurse Rosina at 10:33 hours on 2024-09-22. ---------------------------------------------------- Electronically signed by: Helayne Hurst MD 09/03/2024 05:22 AM EST RP Workstation: [id number]   Result Date:  09/03/2024 EXAM: CT HEAD WITHOUT CONTRAST 22-Sep-2024 10:15:49 AM  TECHNIQUE: CT of the head was performed without the administration of intravenous contrast. Automated exposure control, iterative reconstruction, and/or weight based adjustment of the mA/kV was utilized to reduce the radiation dose to as low as reasonably achievable. COMPARISON: No prior head CT. CLINICAL HISTORY: 53 year old female. Fungating right breast mass. Altered mental status. FINDINGS: BRAIN AND VENTRICLES: No acute hemorrhage. Vague hyperdense oval, rounded mass-like area in the right periatrial white matter (series 2 image 14) abuts the ventricle. Size roughly 1.6 cm. No regional edema or mass effect. Density is less than expected for blood products. Superimposed curvilinear hypodensity in the right superior cerebellum (series 5 image 50), more resembles a small infarct than vasogenic edema. No posterior fossa mass effect. Elsewhere gray white differentiation is maintained. No other cerebral edema or masslike areas identified. No suspicious intracranial vascular hyperdensity. No hydrocephalus. No extra-axial collection. No mass effect or midline shift. ORBITS: No acute abnormality. SINUSES: Visible paranasal sinuses, middle ears and mastoids appear clear. SOFT TISSUES AND SKULL: No acute soft tissue abnormality. No acute or suspicious skull lesion. IMPRESSION: 1. Vague hyperdense mass-like area in the posterior right hemisphere (1.6 cm) is Highly suspicious for metastatic disease to the brain. Furthermore, findings Indeterminate for a small age-indeterminate right cerebellar infarct versus cerebellar edema from an occult metastasis there. No significant intracranial mass effect. 2. Brain MRI without and with contrast would best characterize further. 3. These results will be called to the inpatient team by the PRA, and documented in [facility] worklist. Electronically signed by: Helayne Hurst MD 09/09/24 10:23 AM EST RP Workstation: [id number]    US  RENAL Result Date: Sep 09, 2024 CLINICAL DATA:  [id number] AKI (acute kidney injury) [id number] EXAM: RENAL / URINARY TRACT ULTRASOUND COMPLETE COMPARISON:  May 14, 2024 FINDINGS: Evaluation is markedly limited due to body habitus, overlying bowel gas and poor acoustic penetration Right Kidney: Minimal portions of the RIGHT kidney are visualized secondary to the above mentioned limitations. No definitive hydronephrosis is visualized. Left Kidney: Very limited visualization of the LEFT kidney. Renal measurements: 10.5 x 4.8 x 5.0 cm = volume: 133 mL. No definitive hydronephrosis. Bladder: Appears normal for degree of bladder distention. Other: None. IMPRESSION: Markedly limited evaluation due to body habitus, overlying bowel gas and poor acoustic penetration. No definitive hydronephrosis is visualized. Electronically Signed   By: Corean Salter M.D.   On: 09/09/2024 12:18   US  THORACENTESIS ASP PLEURAL SPACE W/IMG GUIDE Result Date: 09-Sep-2024 INDICATION: 53 year old female with history of bleeding fungating right breast mass, with associated pneumonia and pleural effusion. IR was requested for diagnostic and therapeutic thoracentesis. EXAM: ULTRASOUND GUIDED DIAGNOSTIC AND THERAPEUTIC THORACENTESIS MEDICATIONS: 5 cc of 1% lidocaine . COMPLICATIONS: None immediate. PROCEDURE: An ultrasound guided thoracentesis was thoroughly discussed with the patient and questions answered. The benefits, risks, alternatives and complications were also discussed. The patient understands and wishes to proceed with the procedure. Written consent was obtained. Ultrasound was performed to localize and mark an adequate pocket of fluid in the right chest. The area was then prepped and draped in the normal sterile fashion. 1% Lidocaine  was used for local anesthesia. Under ultrasound guidance a 6 Fr Safe-T-Centesis catheter was introduced. Thoracentesis was performed. The catheter was removed and a dressing applied. FINDINGS: A total of  approximately 600 mL of clear, golden yellow pleural fluid was removed. Samples were sent to the laboratory as requested by the clinical team. IMPRESSION: Successful ultrasound guided right thoracentesis yielding 600 mL of pleural fluid. Procedure performed by Carlin Griffon, PA-C Electronically Signed  By: Wilkie Lent M.D.   On: 2024/09/14 09:29   DG Chest Port 1 View Result Date: Sep 14, 2024 EXAM: 1 VIEW(S) XRAY OF THE CHEST Sep 14, 2024 08:43:00 AM COMPARISON: 04/09/2024 Abdomen MRI 08/01/2024 CLINICAL HISTORY: S/P thoracentesis FINDINGS: LINES, TUBES AND DEVICES: Right-sided chest port with tip at superior cavoatrial junction. Left chest cardiac pacing device noted. LUNGS AND PLEURA: Moderate right pleural effusion, veiling and tracking into the right minor fissure, appears progressed since last month's MRI. Hypo-ventilation at the left lung base appears stable. No definite left pleural effusion. No pneumothorax identified. HEART AND MEDIASTINUM: No acute abnormality of the cardiac and mediastinal silhouettes. BONES AND SOFT TISSUES: Bilateral axillary surgical clips noted. No acute osseous abnormality. IMPRESSION: 1. Moderate right pleural effusion progressed from last month. No pneumothorax following right sided thoracentesis. 2. Stable left lung base hypoventilation. Electronically signed by: Helayne Hurst MD 09-14-2024 08:59 AM EST RP Workstation: [id number]   DG ERCP Result Date: 08/11/2024 CLINICAL DATA:  ERCP EXAM: ERCP 16 fluoroscopy images of the right upper quadrant TECHNIQUE: Multiple spot images obtained with the fluoroscopic device and submitted for interpretation post-procedure. FLUOROSCOPY: Radiation Exposure Index (as provided by the fluoroscopic device): 206.6 mGy Kerma COMPARISON:  None Available. FINDINGS: Fluoroscopic views of the right upper quadrant demonstrate flexible endoscopy device with cannulation of the common bile duct. Contrast injected demonstrates proximal common bile  duct dilatation which is irregular and distal half to the 1/3 of the common bile duct demonstrates significant stricture. Balloon sweep of the common bile duct. Interval placement of temporary plastic stent in the common bile duct. IMPRESSION: ERCP with plastic stent placement in the common bile duct. These images were submitted for radiologic interpretation only. Please see the procedural report for the amount of contrast and the fluoroscopy time utilized. Electronically Signed   By: Cordella Banner   On: 08/11/2024 17:26   DG C-Arm 1-60 Min-No Report Result Date: 08/07/2024 Fluoroscopy was utilized by the requesting physician.  No radiographic interpretation.    Microbiology: Recent Results (from the past 240 hours)  MRSA Next Gen by PCR, Nasal     Status: None   Collection Time: 09/01/24  2:59 PM   Specimen: Nasal Mucosa; Nasal Swab  Result Value Ref Range Status   MRSA by PCR Next Gen NOT DETECTED NOT DETECTED Final    Comment: (NOTE) The GeneXpert MRSA Assay (FDA approved for NASAL specimens only), is one component of a comprehensive MRSA colonization surveillance program. It is not intended to diagnose MRSA infection nor to guide or monitor treatment for MRSA infections. Test performance is not FDA approved in patients less than 89 years old. Performed at [facility], [address], [place], [place] [address]   Body fluid culture w Gram Stain     Status: None (Preliminary result)   Collection Time: 2024/09/14  8:35 AM   Specimen: PATH Cytology Pleural fluid  Result Value Ref Range Status   Specimen Description   Final    PLEURAL Performed at [facility], [address], [place], [place] [address]    Special Requests   Final    NONE Performed at [facility], [address] [place], [place] [address]    Gram Stain   Final    NO WBC SEEN NO ORGANISMS SEEN Performed at [facility], [address]  [place], [place] [address]    Culture PENDING  Incomplete   Report Status PENDING  Incomplete    Time spent:  0 minutes  Signed: Mennie LAMY, MD 09-10-2024

## 2024-09-24 NOTE — Plan of Care (Signed)
   Problem: Clinical Measurements: Goal: Will remain free from infection Outcome: Progressing Goal: Respiratory complications will improve Outcome: Progressing

## 2024-09-24 NOTE — Progress Notes (Signed)
 Chaplain responded to page to support pt's family following her code status change to comfort care. Chaplain team available to provide additional support. If urgent need arises, please page Chaplain Team [phone].  Chaplain [place], [place]    09-06-2024 1300  Spiritual Encounters  Type of Visit Initial  Care provided to: Family;Patient  Referral source Nurse (RN/NT/LPN)  Reason for visit End-of-life  OnCall Visit Yes  Spiritual Framework  Presenting Themes Meaning/purpose/sources of inspiration;Values and beliefs;Impactful experiences and emotions  Values/beliefs Christian faith, prayer valued by pt and family  Community/Connection Family;Significant other (Pt has strong family connections. Pt's fiance, mother, father, daughter, cousins, FIL to be were all gathered at her bedside. They shared stories of time spent with pt and reflections about her life and strength.)  Family Stress Factors Loss;Major life changes  Interventions  Spiritual Care Interventions Made Established relationship of care and support;Compassionate presence;Reflective listening;Narrative/life review;Prayer  Intervention Outcomes  Outcomes Connection to spiritual care;Connection to values and goals of care

## 2024-09-24 NOTE — Progress Notes (Signed)
 Pt resting without distress. O2 7 liters HFNC with oxygen saturation 94--96%. Pt responds to painful stimulation, eyelid flickered when light turned on no other responses noted. Pt bladder scanned and 137 ml noted in the bladder. MD updated on pt status earlier in shift with not additional changes. No break through bleeding noted on bandages at chest area. No additional pain med given during shift. Family remain at bedside with patient. CPAP in place. SRP, RN

## 2024-09-24 NOTE — Progress Notes (Signed)
 PROGRESS NOTE Jessica Simpson  FMW:[id number] DOB: Feb 23, 1971 DOA: 09/01/2024 PCP: [name], MD  Brief Narrative/Hospital Course: Jessica Simpson is a 53 y.o. female with PMH of recurrent breast cancer stage IV under the care of Dr. Odean transferred from [facility] due to bleeding fungating right sided breast mass, and associated pneumonia and pleural effusion. She last received chemotherapy 10/29 and family aware of her poor prognosis and no option for further treatment. she had sudden onset of profuse bleeding from her right breast with fungating mass, admitted to [facility].  Prior to this, she had a dressing in place and fianc describes some thin pink material that would ooze out of it, was found to have hemoglobin of 6.1 and was transfused 2 units of PRBC prior to transfer to [facility] 09/01/24. On admission patient was somnolent, CODE STATUS was discussed and remain full code understanding of poor prognosis  Subjective: Seen and examined today Lethargic, opens eyes to touch, unable to verbalize goes back to sleep- Fiancee at bedside-tearful Overnight used CPAP> HFNC, afebrile, VSS, Labs bump in creatinine 2.8> 2.7, pancytopenic Got thoro this am.  Assessment and plan:  Bleeding fungating right breast mass ABLA on chronic anemia of malignancy Stage IV breast cancer-: CT scan reportedly done at [facility] shows a fungating mass penetrating through her chest wall and skin. S/p 2 u PRBC PTA for hemoglobin of 6.1. In regards to cancer had chemotherapy and appears no further treatment option Oncology- Dr. Rodolph Dr. Dewey from rad-onc and PMT consulted In case of recurrent bleed patient will need FFP, may also consider urgent IR evaluation and intervention Monitor hemoglobin transfuse if less than 7 g. Dr. Odean notified- with her clinical condition like thsi doubt seh will be a candidate for chemo further- PMT to see this am family to arrive at  bedside this am Recent Labs  Lab 09/01/24 1250 09/10/2024 0326  HGB 8.3* 7.2*  HCT 27.1* 24.2*    Acute respiratory failure with hypoxia CAP/left upper lobe consolidation seen on CT Large right pleural effusion Immunocompromised in the setting of chemotherapy: Continue nasal cannula to keep saturation above 92%.  Continue Zosyn, vancomycin  Potentially malignant effusion, or parapneumonic. S/p US  guided thoracentesis 11/9-600 cc of fluid evaluate follow-up fluid analysis   Acute metabolic/toxic encephalopathy Highly suspicious for metastatic disease to the brain/ight cerebellar infarct versus cerebellar edema from an occult metastasis: Patient has been confused lethargic on admission likely multifactorial CT head pending.  Ammonia normal,  CT this jf:Cjhlz hyperdense mass-like area in the posterior right hemisphere (1.6 cm) is Highly suspicious for metastatic disease to the brain. Furthermore, findings Indeterminate for a small age-indeterminate right cerebellar infarct versus cerebellar edema from an occult metastasis there. No significant intracranial mass effect.Brain MRI without and with contrast would best characterize further. Family updated about the results, awaiting for palliative care discussion and family leaning towards comfort measures at this time.  Pancytopenia-anemia and thrombocytopenia due to malignancy: F/u and give blood and platelets as indicated    AKI on CKD 3a Hyperkalemia: B/l creat ~ 1.1 in Oct.  Creatinine worsening in the setting of anemia, pneumonia Avoid hypotension dehydration infection medication.  Patient appears swollen, albumin 3.2 on admission Consulted Nephro, keep in AKI precautions minimize nephrotoxic meds,renally dosing meds-will start vancomycin-given significant worsening of creatinine.  Again prognosis appears guarded, renal ultrasound has been ordered , insert foley. Recent Labs    06/12/24 0816 06/19/24 0948 07/05/24 1134 07/13/24 1128  07/31/24 0916 08/02/24 1141  08/03/24 0419 08/20/24 1344 09/01/24 1250 09/29/24 0326  BUN 19 20 16 16 13 13 12  25* 34* 37*  CREATININE 0.90 0.96 0.90 0.74 0.87 0.98 1.01* 1.15* 1.84* 2.72*  CO2 26 26 27 27 26 27 24 25 24  24  K 3.7 3.4* 3.2* 3.7 3.2* 3.3* 3.9 3.4* 4.3 5.1    Hypertension: Stable, hold home metoprolol , Norvasc    Anxiety: Cont ativan  as needed   OSA CPAP at night  Class I Obesity w/ Body mass index is 32.91 kg/m.: Will benefit with PCP follow-up, weight loss,healthy lifestyle  Goals of care: Patient extremely poor prognosis Oncology palliative care has been consulted.  Continue supportive care.  Remains full code for now Discussed oncology and palliative care team, patient's prognosis is extremely poor. Palliative care also arrived at the bedside and discussed, at this time daughter and fianc wants to proceed with comfort measures, patient's oncologist at [facility] has been notified and he agrees.  DVT prophylaxis:  Code Status:   Code Status: Do not attempt resuscitation (DNR) - Comfort care Family Communication: plan of care discussed with patient/fiancee and daughter at bedside. Patient status is: Remains hospitalized because of severity of illness Level of care: Progressive   Dispo: The patient is from: home            Anticipated disposition: TBD Objective: Vitals last 24 hrs: Vitals:   09/01/24 2245 2024/09/29 0250 2024-09-29 0327 Sep 29, 2024 0628  BP:  (!) 109/58  (!) 116/53  Pulse:  (!) 104  (!) 101  Resp: 20 15  16   Temp:  97.7 F (36.5 C)  98 F (36.7 C)  TempSrc:  Oral  Oral  SpO2: 95% 100%  100%  Weight:   92.5 kg   Height:        Physical Examination: General exam: minimally responding HEENT:Oral mucosa moist, Ear/Nose WNL grossly Respiratory system: Bilaterally diminished,no use of accessory muscle Cardiovascular system: S1 & S2 +, No JVD. Gastrointestinal system: Abdomen soft,NT,ND, BS+ Nervous System: lethargic. Opens eyes to  touch Extremities: extremities warm, leg edema ++ Skin: Warm, no rashes MSK: Normal muscle bulk,tone, power     Data Reviewed: I have personally reviewed following labs and imaging studies ( see epic result tab) CBC: Recent Labs  Lab 09/01/24 1250 Sep 29, 2024 0326  WBC 1.3* 1.7*  HGB 8.3* 7.2*  HCT 27.1* 24.2*  MCV 97.1 100.4*  PLT 58* 59*   CMP: Recent Labs  Lab 09/01/24 1250 09/29/24 0326  NA 138 138  K 4.3 5.1  CL 103 103  CO2 24 24  GLUCOSE 133* 122*  BUN 34* 37*  CREATININE 1.84* 2.72*  CALCIUM 8.4* 8.1*   GFR: Estimated Creatinine Clearance: 27.4 mL/min (A) (by C-G formula based on SCr of 2.72 mg/dL (H)). Recent Labs  Lab 09/01/24 1250  AST 23  ALT 14  ALKPHOS 113  BILITOT 1.0  PROT 5.7*  ALBUMIN 3.2*   No results for input(s): LIPASE, AMYLASE in the last 168 hours.  Recent Labs  Lab 09/01/24 1756  AMMONIA 30   Coagulation Profile:  Recent Labs  Lab 09/01/24 1250  INR 1.1   Unresulted Labs (From admission, onward)     Start     Ordered   September 29, 2024 0828  Body fluid culture w Gram Stain  RELEASE UPON TISA,   TIMED        Sep 29, 2024 9171           Antimicrobials/Microbiology: Anti-infectives (From admission, onward)    Start  Dose/Rate Route Frequency Ordered Stop   2024-10-02 0800  vancomycin (VANCOREADY) IVPB 750 mg/150 mL  Status:  Discontinued        750 mg 150 mL/hr over 60 Minutes Intravenous Every 24 hours 09/01/24 1440 10-02-24 0949   09/01/24 1400  piperacillin-tazobactam (ZOSYN) IVPB 3.375 g  Status:  Discontinued        3.375 g 12.5 mL/hr over 240 Minutes Intravenous Every 8 hours 09/01/24 1313 10/02/24 1116   09/01/24 1300  azithromycin (ZITHROMAX) 500 mg in sodium chloride  0.9 % 250 mL IVPB  Status:  Discontinued        500 mg 250 mL/hr over 60 Minutes Intravenous Daily 09/01/24 1225 09/01/24 1252   09/01/24 1300  cefTRIAXone (ROCEPHIN) 1 g in sodium chloride  0.9 % 100 mL IVPB  Status:  Discontinued        1 g 200 mL/hr  over 30 Minutes Intravenous Daily 09/01/24 1225 09/01/24 1252         Component Value Date/Time   SDES  10/14/2023 1012    URINE, CLEAN CATCH Performed at [facility], [address] [place], [place] [address]    SPECREQUEST  10/14/2023 1012    NONE Performed at [facility], [address], [place], [place] [address]    CULT 10,000 COLONIES/mL STAPHYLOCOCCUS HAEMOLYTICUS (A) 10/14/2023 1012   REPTSTATUS 10/17/2023 FINAL 10/14/2023 1012    Procedures:    Mennie LAMY, MD [facility] Oct 02, 2024, 11:38 AM

## 2024-09-24 NOTE — Procedures (Signed)
 PROCEDURE SUMMARY:  Successful image-guided right-sided diagnostic and therapeutic thoracentesis. Yielded 0.60 liters of clear, golden yellow pleural fluid. Patient tolerated procedure well. EBL: Zero No immediate complications.  Specimen was sent for labs. Post procedure CXR is pending.  Please see imaging section of Epic for full dictation.  Ameera Tigue A Maxtyn Nuzum PA-C 09/09/24 8:22 AM

## 2024-09-24 NOTE — Hospital Course (Addendum)
 Jessica Simpson is a 53 y.o. female with PMH of recurrent breast cancer stage IV under the care of Dr. Odean transferred from [facility] due to bleeding fungating right sided breast mass, and associated pneumonia and pleural effusion. She last received chemotherapy 10/29 and family aware of her poor prognosis and no option for further treatment. she had sudden onset of profuse bleeding from her right breast with fungating mass, admitted to [facility]  Prior to this, she had a dressing in place and fianc describes some thin pink material that would ooze out of it, was found to have hemoglobin of 6.1 and was transfused 2 units of PRBC prior to transfer to [facility] 09/01/24. On admission patient was somnolent, CODE STATUS was discussed and remain full code understanding of poor prognosis CT scan reportedly done at [facility] shows a fungating mass penetrating through her chest wall and skin.  Oncology- Dr. Rodolph Dr. 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 Furthermore, findings Indeterminate for a small age-indeterminate right cerebellar infarct versus cerebellar edema from an occult metastasis there. No significant intracranial mass effect.Brain MRI without and with contrast would best characterize further. Family updated about the results. Further workup showed worsening of renal failure, ongoing severe encephalopathy Patient's primary oncology notified and palliative care consulted and subsequently transitioned to hospice, and patient's primary oncology was in agreement Patient patient passed away peacefully in-hospital  Assessment and plan:  Bleeding fungating right breast mass ABLA on chronic anemia of malignancy Stage IV breast cancer-: Acute respiratory failure with  hypoxia CAP/left upper lobe consolidation seen on CT Large right pleural effusion Immunocompromised in the setting of chemotherapy Acute metabolic/toxic encephalopathy 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

## 2024-09-24 DEATH — deceased

## 2024-09-28 ENCOUNTER — Other Ambulatory Visit: Payer: Self-pay | Admitting: Adult Health

## 2024-10-03 ENCOUNTER — Inpatient Hospital Stay: Admitting: Hematology and Oncology

## 2024-10-03 ENCOUNTER — Inpatient Hospital Stay

## 2024-10-22 ENCOUNTER — Other Ambulatory Visit: Payer: Self-pay

## 2024-10-22 ENCOUNTER — Other Ambulatory Visit (HOSPITAL_COMMUNITY): Payer: Self-pay

## 2024-10-24 ENCOUNTER — Inpatient Hospital Stay: Admitting: Hematology and Oncology

## 2024-10-24 ENCOUNTER — Inpatient Hospital Stay
